# Patient Record
Sex: Female | Born: 1940
Health system: Southern US, Community
[De-identification: ages and names within clinical notes are randomized; demographics above are authoritative.]

## PROBLEM LIST (undated history)

## (undated) DIAGNOSIS — K219 Gastro-esophageal reflux disease without esophagitis: Secondary | ICD-10-CM

## (undated) DIAGNOSIS — D649 Anemia, unspecified: Secondary | ICD-10-CM

## (undated) DIAGNOSIS — K529 Noninfective gastroenteritis and colitis, unspecified: Secondary | ICD-10-CM

## (undated) DIAGNOSIS — K635 Polyp of colon: Secondary | ICD-10-CM

## (undated) DIAGNOSIS — R112 Nausea with vomiting, unspecified: Secondary | ICD-10-CM

## (undated) DIAGNOSIS — M1990 Unspecified osteoarthritis, unspecified site: Secondary | ICD-10-CM

## (undated) DIAGNOSIS — H269 Unspecified cataract: Secondary | ICD-10-CM

## (undated) DIAGNOSIS — H547 Unspecified visual loss: Secondary | ICD-10-CM

## (undated) DIAGNOSIS — G5600 Carpal tunnel syndrome, unspecified upper limb: Secondary | ICD-10-CM

## (undated) DIAGNOSIS — E119 Type 2 diabetes mellitus without complications: Secondary | ICD-10-CM

## (undated) DIAGNOSIS — N289 Disorder of kidney and ureter, unspecified: Secondary | ICD-10-CM

## (undated) DIAGNOSIS — Z955 Presence of coronary angioplasty implant and graft: Secondary | ICD-10-CM

## (undated) DIAGNOSIS — J189 Pneumonia, unspecified organism: Secondary | ICD-10-CM

## (undated) DIAGNOSIS — I499 Cardiac arrhythmia, unspecified: Secondary | ICD-10-CM

## (undated) DIAGNOSIS — I4891 Unspecified atrial fibrillation: Secondary | ICD-10-CM

## (undated) DIAGNOSIS — M810 Age-related osteoporosis without current pathological fracture: Secondary | ICD-10-CM

## (undated) DIAGNOSIS — K589 Irritable bowel syndrome without diarrhea: Secondary | ICD-10-CM

## (undated) DIAGNOSIS — F32A Depression, unspecified: Secondary | ICD-10-CM

## (undated) DIAGNOSIS — I10 Essential (primary) hypertension: Secondary | ICD-10-CM

## (undated) DIAGNOSIS — I2510 Atherosclerotic heart disease of native coronary artery without angina pectoris: Secondary | ICD-10-CM

## (undated) DIAGNOSIS — Z9289 Personal history of other medical treatment: Secondary | ICD-10-CM

## (undated) DIAGNOSIS — C189 Malignant neoplasm of colon, unspecified: Secondary | ICD-10-CM

## (undated) DIAGNOSIS — E213 Hyperparathyroidism, unspecified: Secondary | ICD-10-CM

## (undated) DIAGNOSIS — E785 Hyperlipidemia, unspecified: Secondary | ICD-10-CM

## (undated) DIAGNOSIS — R296 Repeated falls: Secondary | ICD-10-CM

## (undated) DIAGNOSIS — F329 Major depressive disorder, single episode, unspecified: Secondary | ICD-10-CM

## (undated) DIAGNOSIS — Z9889 Other specified postprocedural states: Secondary | ICD-10-CM

## (undated) DIAGNOSIS — F419 Anxiety disorder, unspecified: Secondary | ICD-10-CM

## (undated) DIAGNOSIS — IMO0002 Reserved for concepts with insufficient information to code with codable children: Secondary | ICD-10-CM

## (undated) DIAGNOSIS — F0390 Unspecified dementia without behavioral disturbance: Secondary | ICD-10-CM

## (undated) HISTORY — DX: Carpal tunnel syndrome, unspecified upper limb: G56.00

## (undated) HISTORY — PX: COLON SURGERY: SHX602

## (undated) HISTORY — DX: Reserved for concepts with insufficient information to code with codable children: IMO0002

## (undated) HISTORY — DX: Depression, unspecified: F32.A

## (undated) HISTORY — PX: UPPER GI ENDOSCOPY: SHX6162

## (undated) HISTORY — DX: Malignant neoplasm of colon, unspecified: C18.9

## (undated) HISTORY — DX: Gastro-esophageal reflux disease without esophagitis: K21.9

## (undated) HISTORY — DX: Major depressive disorder, single episode, unspecified: F32.9

## (undated) HISTORY — PX: DILATION AND CURETTAGE OF UTERUS: SHX78

## (undated) HISTORY — PX: TONSILLECTOMY: SUR1361

## (undated) HISTORY — DX: Hyperlipidemia, unspecified: E78.5

## (undated) HISTORY — DX: Anxiety disorder, unspecified: F41.9

## (undated) HISTORY — DX: Polyp of colon: K63.5

## (undated) HISTORY — DX: Noninfective gastroenteritis and colitis, unspecified: K52.9

## (undated) HISTORY — DX: Age-related osteoporosis without current pathological fracture: M81.0

## (undated) HISTORY — PX: HEMATOMA EVACUATION: SHX5118

## (undated) HISTORY — DX: Essential (primary) hypertension: I10

## (undated) HISTORY — PX: CARPAL TUNNEL RELEASE: SHX101

## (undated) HISTORY — DX: Type 2 diabetes mellitus without complications: E11.9

## (undated) HISTORY — DX: Unspecified osteoarthritis, unspecified site: M19.90

## (undated) HISTORY — PX: KNEE ARTHROSCOPY: SHX127

## (undated) HISTORY — PX: COLONOSCOPY: SHX174

## (undated) HISTORY — DX: Anemia, unspecified: D64.9

## (undated) HISTORY — DX: Unspecified cataract: H26.9

## (undated) HISTORY — DX: Irritable bowel syndrome, unspecified: K58.9

## (undated) HISTORY — PX: CHOLECYSTECTOMY: SHX55

---

## 1998-10-31 ENCOUNTER — Encounter: Admission: RE | Admit: 1998-10-31 | Discharge: 1999-01-29 | Payer: Self-pay | Admitting: Internal Medicine

## 1999-07-25 ENCOUNTER — Encounter: Payer: Self-pay | Admitting: Internal Medicine

## 1999-07-25 ENCOUNTER — Encounter: Admission: RE | Admit: 1999-07-25 | Discharge: 1999-07-25 | Payer: Self-pay | Admitting: Internal Medicine

## 1999-08-07 ENCOUNTER — Other Ambulatory Visit: Admission: RE | Admit: 1999-08-07 | Discharge: 1999-08-07 | Payer: Self-pay | Admitting: Internal Medicine

## 1999-12-22 ENCOUNTER — Encounter: Payer: Self-pay | Admitting: Orthopedic Surgery

## 1999-12-22 ENCOUNTER — Encounter: Admission: RE | Admit: 1999-12-22 | Discharge: 1999-12-22 | Payer: Self-pay | Admitting: Orthopedic Surgery

## 1999-12-23 ENCOUNTER — Ambulatory Visit (HOSPITAL_BASED_OUTPATIENT_CLINIC_OR_DEPARTMENT_OTHER): Admission: RE | Admit: 1999-12-23 | Discharge: 1999-12-23 | Payer: Self-pay | Admitting: Orthopedic Surgery

## 2000-08-02 ENCOUNTER — Encounter: Admission: RE | Admit: 2000-08-02 | Discharge: 2000-08-02 | Payer: Self-pay | Admitting: Internal Medicine

## 2000-08-02 ENCOUNTER — Encounter: Payer: Self-pay | Admitting: Internal Medicine

## 2000-08-09 ENCOUNTER — Encounter: Admission: RE | Admit: 2000-08-09 | Discharge: 2000-08-09 | Payer: Self-pay | Admitting: Internal Medicine

## 2000-08-09 ENCOUNTER — Encounter: Payer: Self-pay | Admitting: Internal Medicine

## 2001-08-04 ENCOUNTER — Encounter: Payer: Self-pay | Admitting: Family Medicine

## 2001-08-04 ENCOUNTER — Encounter: Admission: RE | Admit: 2001-08-04 | Discharge: 2001-08-04 | Payer: Self-pay | Admitting: Family Medicine

## 2002-03-26 ENCOUNTER — Encounter: Admission: RE | Admit: 2002-03-26 | Discharge: 2002-03-26 | Payer: Self-pay | Admitting: Orthopedic Surgery

## 2002-03-26 ENCOUNTER — Encounter: Payer: Self-pay | Admitting: Orthopedic Surgery

## 2002-04-10 ENCOUNTER — Ambulatory Visit (HOSPITAL_BASED_OUTPATIENT_CLINIC_OR_DEPARTMENT_OTHER): Admission: RE | Admit: 2002-04-10 | Discharge: 2002-04-10 | Payer: Self-pay | Admitting: Orthopedic Surgery

## 2002-08-07 ENCOUNTER — Encounter: Admission: RE | Admit: 2002-08-07 | Discharge: 2002-08-07 | Payer: Self-pay | Admitting: Family Medicine

## 2002-08-07 ENCOUNTER — Encounter: Payer: Self-pay | Admitting: Family Medicine

## 2002-08-14 ENCOUNTER — Other Ambulatory Visit: Admission: RE | Admit: 2002-08-14 | Discharge: 2002-08-14 | Payer: Self-pay | Admitting: Family Medicine

## 2003-08-09 ENCOUNTER — Encounter: Admission: RE | Admit: 2003-08-09 | Discharge: 2003-08-09 | Payer: Self-pay | Admitting: Family Medicine

## 2003-12-30 ENCOUNTER — Emergency Department (HOSPITAL_COMMUNITY): Admission: EM | Admit: 2003-12-30 | Discharge: 2003-12-30 | Payer: Self-pay

## 2004-08-14 ENCOUNTER — Ambulatory Visit (HOSPITAL_COMMUNITY): Admission: RE | Admit: 2004-08-14 | Discharge: 2004-08-14 | Payer: Self-pay | Admitting: Family Medicine

## 2004-09-03 ENCOUNTER — Ambulatory Visit: Payer: Self-pay | Admitting: Family Medicine

## 2004-09-10 ENCOUNTER — Ambulatory Visit: Payer: Self-pay | Admitting: Family Medicine

## 2004-09-10 ENCOUNTER — Other Ambulatory Visit: Admission: RE | Admit: 2004-09-10 | Discharge: 2004-09-10 | Payer: Self-pay | Admitting: Family Medicine

## 2004-09-27 ENCOUNTER — Ambulatory Visit: Payer: Self-pay | Admitting: Family Medicine

## 2004-10-07 ENCOUNTER — Ambulatory Visit: Payer: Self-pay | Admitting: Internal Medicine

## 2004-10-14 ENCOUNTER — Ambulatory Visit: Payer: Self-pay | Admitting: Family Medicine

## 2004-10-16 ENCOUNTER — Ambulatory Visit: Payer: Self-pay | Admitting: Internal Medicine

## 2004-10-20 ENCOUNTER — Ambulatory Visit: Payer: Self-pay | Admitting: Internal Medicine

## 2004-11-07 ENCOUNTER — Ambulatory Visit: Payer: Self-pay | Admitting: Family Medicine

## 2004-11-14 ENCOUNTER — Ambulatory Visit: Payer: Self-pay | Admitting: Family Medicine

## 2004-11-17 ENCOUNTER — Ambulatory Visit: Payer: Self-pay | Admitting: Family Medicine

## 2005-08-25 ENCOUNTER — Encounter: Admission: RE | Admit: 2005-08-25 | Discharge: 2005-08-25 | Payer: Self-pay | Admitting: Family Medicine

## 2005-09-11 ENCOUNTER — Ambulatory Visit: Payer: Self-pay | Admitting: Family Medicine

## 2005-09-18 ENCOUNTER — Ambulatory Visit: Payer: Self-pay | Admitting: Family Medicine

## 2005-09-28 DIAGNOSIS — Z955 Presence of coronary angioplasty implant and graft: Secondary | ICD-10-CM

## 2005-09-28 HISTORY — DX: Presence of coronary angioplasty implant and graft: Z95.5

## 2005-11-05 ENCOUNTER — Ambulatory Visit: Payer: Self-pay | Admitting: Family Medicine

## 2005-12-30 ENCOUNTER — Ambulatory Visit: Payer: Self-pay | Admitting: Family Medicine

## 2006-01-07 ENCOUNTER — Encounter: Payer: Self-pay | Admitting: Cardiology

## 2006-01-07 ENCOUNTER — Ambulatory Visit: Payer: Self-pay

## 2006-01-15 ENCOUNTER — Ambulatory Visit: Payer: Self-pay | Admitting: Internal Medicine

## 2006-01-20 ENCOUNTER — Inpatient Hospital Stay (HOSPITAL_BASED_OUTPATIENT_CLINIC_OR_DEPARTMENT_OTHER): Admission: RE | Admit: 2006-01-20 | Discharge: 2006-01-20 | Payer: Self-pay | Admitting: Internal Medicine

## 2006-01-20 ENCOUNTER — Ambulatory Visit: Payer: Self-pay | Admitting: Internal Medicine

## 2006-01-22 ENCOUNTER — Ambulatory Visit: Payer: Self-pay | Admitting: Cardiology

## 2006-01-22 ENCOUNTER — Ambulatory Visit (HOSPITAL_COMMUNITY): Admission: RE | Admit: 2006-01-22 | Discharge: 2006-01-23 | Payer: Self-pay | Admitting: Cardiology

## 2006-01-28 ENCOUNTER — Ambulatory Visit: Payer: Self-pay | Admitting: Cardiology

## 2006-03-11 ENCOUNTER — Ambulatory Visit: Payer: Self-pay | Admitting: Internal Medicine

## 2006-03-18 ENCOUNTER — Ambulatory Visit: Payer: Self-pay | Admitting: Internal Medicine

## 2006-05-03 ENCOUNTER — Ambulatory Visit: Payer: Self-pay | Admitting: Family Medicine

## 2006-05-03 ENCOUNTER — Ambulatory Visit: Payer: Self-pay | Admitting: Internal Medicine

## 2006-05-28 ENCOUNTER — Encounter: Admission: RE | Admit: 2006-05-28 | Discharge: 2006-05-28 | Payer: Self-pay | Admitting: Internal Medicine

## 2006-05-28 ENCOUNTER — Ambulatory Visit: Payer: Self-pay | Admitting: Internal Medicine

## 2006-06-09 ENCOUNTER — Ambulatory Visit: Payer: Self-pay | Admitting: *Deleted

## 2006-06-18 ENCOUNTER — Ambulatory Visit: Payer: Self-pay | Admitting: *Deleted

## 2006-06-25 ENCOUNTER — Ambulatory Visit: Payer: Self-pay | Admitting: *Deleted

## 2006-06-28 ENCOUNTER — Ambulatory Visit: Payer: Self-pay | Admitting: Internal Medicine

## 2006-06-30 ENCOUNTER — Ambulatory Visit: Payer: Self-pay | Admitting: Internal Medicine

## 2006-07-28 ENCOUNTER — Ambulatory Visit: Payer: Self-pay

## 2006-07-28 ENCOUNTER — Ambulatory Visit: Payer: Self-pay | Admitting: Family Medicine

## 2006-08-09 ENCOUNTER — Ambulatory Visit: Payer: Self-pay | Admitting: Family Medicine

## 2006-08-24 ENCOUNTER — Ambulatory Visit: Payer: Self-pay | Admitting: Family Medicine

## 2006-08-27 ENCOUNTER — Ambulatory Visit: Payer: Self-pay | Admitting: Family Medicine

## 2006-08-28 LAB — CONVERTED CEMR LAB: Pap Smear: NORMAL

## 2006-09-06 ENCOUNTER — Ambulatory Visit: Payer: Self-pay | Admitting: Internal Medicine

## 2006-09-07 ENCOUNTER — Ambulatory Visit: Payer: Self-pay | Admitting: Family Medicine

## 2006-09-07 LAB — CONVERTED CEMR LAB
ALT: 34 units/L (ref 0–40)
AST: 29 units/L (ref 0–37)
Albumin: 4.1 g/dL (ref 3.5–5.2)
Alkaline Phosphatase: 81 units/L (ref 39–117)
BUN: 17 mg/dL (ref 6–23)
Basophils Absolute: 0 10*3/uL (ref 0.0–0.1)
Basophils Relative: 0.2 % (ref 0.0–1.0)
CO2: 30 meq/L (ref 19–32)
Calcium: 9.5 mg/dL (ref 8.4–10.5)
Chloride: 103 meq/L (ref 96–112)
Chol/HDL Ratio, serum: 4
Cholesterol: 132 mg/dL (ref 0–200)
Creatinine, Ser: 0.9 mg/dL (ref 0.4–1.2)
Creatinine,U: 181.8 mg/dL
Eosinophil percent: 0.8 % (ref 0.0–5.0)
GFR calc non Af Amer: 67 mL/min
Glomerular Filtration Rate, Af Am: 81 mL/min/{1.73_m2}
Glucose, Bld: 100 mg/dL — ABNORMAL HIGH (ref 70–99)
HCT: 38.8 % (ref 36.0–46.0)
HDL: 32.7 mg/dL — ABNORMAL LOW (ref >39.0)
Hemoglobin: 13.4 g/dL (ref 12.0–15.0)
Hgb A1c MFr Bld: 7.3 % — ABNORMAL HIGH (ref 4.6–6.0)
LDL DIRECT: 60 mg/dL
Lymphocytes Relative: 18.2 % (ref 12.0–46.0)
MCHC: 34.6 g/dL (ref 30.0–36.0)
MCV: 93.3 fL (ref 78.0–100.0)
Microalb Creat Ratio: 9.4 mg/g (ref 0.0–30.0)
Microalb, Ur: 1.7 mg/dL (ref 0.0–1.9)
Monocytes Absolute: 0.9 10*3/uL — ABNORMAL HIGH (ref 0.2–0.7)
Monocytes Relative: 6.4 % (ref 3.0–11.0)
Neutro Abs: 10.1 10*3/uL — ABNORMAL HIGH (ref 1.4–7.7)
Neutrophils Relative %: 74.4 % (ref 43.0–77.0)
Platelets: 267 10*3/uL (ref 150–400)
Potassium: 4.6 meq/L (ref 3.5–5.1)
RBC: 4.16 M/uL (ref 3.87–5.11)
RDW: 11.9 % (ref 11.5–14.6)
Sodium: 141 meq/L (ref 135–145)
TSH: 1.72 microintl units/mL (ref 0.35–5.50)
Total Bilirubin: 0.8 mg/dL (ref 0.3–1.2)
Total Protein: 6.5 g/dL (ref 6.0–8.3)
Triglyceride fasting, serum: 240 mg/dL (ref 0–149)
VLDL: 48 mg/dL — ABNORMAL HIGH (ref 0–40)
WBC: 13.6 10*3/uL — ABNORMAL HIGH (ref 4.5–10.5)

## 2006-09-13 ENCOUNTER — Ambulatory Visit: Payer: Self-pay | Admitting: Family Medicine

## 2006-09-13 ENCOUNTER — Encounter: Payer: Self-pay | Admitting: Family Medicine

## 2006-09-13 ENCOUNTER — Other Ambulatory Visit: Admission: RE | Admit: 2006-09-13 | Discharge: 2006-09-13 | Payer: Self-pay | Admitting: Family Medicine

## 2006-12-09 ENCOUNTER — Ambulatory Visit: Payer: Self-pay | Admitting: Internal Medicine

## 2006-12-09 LAB — CONVERTED CEMR LAB
ALT: 27 units/L (ref 0–40)
AST: 30 units/L (ref 0–37)
Albumin: 3.5 g/dL (ref 3.5–5.2)
Alkaline Phosphatase: 67 units/L (ref 39–117)
BUN: 12 mg/dL (ref 6–23)
Bilirubin, Direct: 0.2 mg/dL (ref 0.0–0.3)
CO2: 30 meq/L (ref 19–32)
Calcium: 9.2 mg/dL (ref 8.4–10.5)
Chloride: 104 meq/L (ref 96–112)
Cholesterol: 136 mg/dL (ref 0–200)
Creatinine, Ser: 0.8 mg/dL (ref 0.4–1.2)
Direct LDL: 71.6 mg/dL
GFR calc Af Amer: 92 mL/min
GFR calc non Af Amer: 76 mL/min
Glucose, Bld: 163 mg/dL — ABNORMAL HIGH (ref 70–99)
HDL: 38.5 mg/dL — ABNORMAL LOW (ref >39.0)
Potassium: 3.9 meq/L (ref 3.5–5.1)
Sodium: 144 meq/L (ref 135–145)
Total Bilirubin: 0.9 mg/dL (ref 0.3–1.2)
Total CHOL/HDL Ratio: 3.5
Total Protein: 6.5 g/dL (ref 6.0–8.3)
Triglycerides: 225 mg/dL (ref 0–149)
VLDL: 45 mg/dL — ABNORMAL HIGH (ref 0–40)

## 2007-02-15 ENCOUNTER — Ambulatory Visit: Payer: Self-pay | Admitting: Family Medicine

## 2007-03-21 DIAGNOSIS — E1121 Type 2 diabetes mellitus with diabetic nephropathy: Secondary | ICD-10-CM | POA: Insufficient documentation

## 2007-03-21 DIAGNOSIS — K589 Irritable bowel syndrome without diarrhea: Secondary | ICD-10-CM

## 2007-03-21 DIAGNOSIS — I10 Essential (primary) hypertension: Secondary | ICD-10-CM | POA: Insufficient documentation

## 2007-03-21 DIAGNOSIS — E1169 Type 2 diabetes mellitus with other specified complication: Secondary | ICD-10-CM | POA: Insufficient documentation

## 2007-03-21 DIAGNOSIS — G5600 Carpal tunnel syndrome, unspecified upper limb: Secondary | ICD-10-CM

## 2007-03-21 DIAGNOSIS — M1990 Unspecified osteoarthritis, unspecified site: Secondary | ICD-10-CM | POA: Insufficient documentation

## 2007-04-28 ENCOUNTER — Telehealth: Payer: Self-pay | Admitting: Family Medicine

## 2007-05-02 ENCOUNTER — Ambulatory Visit: Payer: Self-pay | Admitting: Internal Medicine

## 2007-05-03 ENCOUNTER — Ambulatory Visit: Payer: Self-pay | Admitting: Internal Medicine

## 2007-05-03 LAB — CONVERTED CEMR LAB
ALT: 24 units/L (ref 0–35)
AST: 25 units/L (ref 0–37)
Albumin: 4 g/dL (ref 3.5–5.2)
Alkaline Phosphatase: 49 units/L (ref 39–117)
BUN: 16 mg/dL (ref 6–23)
Bilirubin, Direct: 0.1 mg/dL (ref 0.0–0.3)
CO2: 31 meq/L (ref 19–32)
Calcium: 9.4 mg/dL (ref 8.4–10.5)
Chloride: 105 meq/L (ref 96–112)
Cholesterol: 133 mg/dL (ref 0–200)
Creatinine, Ser: 0.8 mg/dL (ref 0.4–1.2)
Direct LDL: 66.4 mg/dL
GFR calc Af Amer: 92 mL/min
GFR calc non Af Amer: 76 mL/min
Glucose, Bld: 157 mg/dL — ABNORMAL HIGH (ref 70–99)
HDL: 30.9 mg/dL — ABNORMAL LOW (ref >39.0)
Potassium: 4 meq/L (ref 3.5–5.1)
Sodium: 142 meq/L (ref 135–145)
Total Bilirubin: 1.2 mg/dL (ref 0.3–1.2)
Total CHOL/HDL Ratio: 4.3
Total Protein: 6.5 g/dL (ref 6.0–8.3)
Triglycerides: 250 mg/dL (ref 0–149)
VLDL: 50 mg/dL — ABNORMAL HIGH (ref 0–40)

## 2007-05-10 ENCOUNTER — Ambulatory Visit: Payer: Self-pay | Admitting: Family Medicine

## 2007-05-12 ENCOUNTER — Telehealth: Payer: Self-pay | Admitting: Family Medicine

## 2007-05-12 LAB — CONVERTED CEMR LAB: Hgb A1c MFr Bld: 6.3 % — ABNORMAL HIGH (ref 4.6–6.0)

## 2007-07-01 ENCOUNTER — Ambulatory Visit: Payer: Self-pay | Admitting: Family Medicine

## 2007-09-01 ENCOUNTER — Encounter: Admission: RE | Admit: 2007-09-01 | Discharge: 2007-09-01 | Payer: Self-pay | Admitting: Family Medicine

## 2007-09-01 ENCOUNTER — Encounter: Payer: Self-pay | Admitting: Family Medicine

## 2007-09-19 ENCOUNTER — Ambulatory Visit: Payer: Self-pay | Admitting: Family Medicine

## 2007-09-19 DIAGNOSIS — K219 Gastro-esophageal reflux disease without esophagitis: Secondary | ICD-10-CM

## 2007-09-19 DIAGNOSIS — F411 Generalized anxiety disorder: Secondary | ICD-10-CM | POA: Insufficient documentation

## 2007-09-19 DIAGNOSIS — F331 Major depressive disorder, recurrent, moderate: Secondary | ICD-10-CM | POA: Insufficient documentation

## 2007-09-19 DIAGNOSIS — E785 Hyperlipidemia, unspecified: Secondary | ICD-10-CM

## 2007-09-20 ENCOUNTER — Encounter: Payer: Self-pay | Admitting: Family Medicine

## 2007-09-30 ENCOUNTER — Telehealth: Payer: Self-pay | Admitting: Family Medicine

## 2007-10-05 LAB — CONVERTED CEMR LAB
ALT: 23 units/L (ref 0–35)
AST: 22 units/L (ref 0–37)
Albumin: 4 g/dL (ref 3.5–5.2)
Alkaline Phosphatase: 80 units/L (ref 39–117)
BUN: 19 mg/dL (ref 6–23)
Basophils Absolute: 0 10*3/uL (ref 0.0–0.1)
Basophils Relative: 0.2 % (ref 0.0–1.0)
Bilirubin, Direct: 0.1 mg/dL (ref 0.0–0.3)
CO2: 30 meq/L (ref 19–32)
Calcium: 9.6 mg/dL (ref 8.4–10.5)
Chloride: 100 meq/L (ref 96–112)
Cholesterol: 157 mg/dL (ref 0–200)
Creatinine, Ser: 0.9 mg/dL (ref 0.4–1.2)
Direct LDL: 86.2 mg/dL
Eosinophils Absolute: 0.3 10*3/uL (ref 0.0–0.6)
Eosinophils Relative: 1.5 % (ref 0.0–5.0)
GFR calc Af Amer: 80 mL/min
GFR calc non Af Amer: 66 mL/min
Glucose, Bld: 195 mg/dL — ABNORMAL HIGH (ref 70–99)
HCT: 36.7 % (ref 36.0–46.0)
HDL: 34.9 mg/dL — ABNORMAL LOW (ref >39.0)
Hemoglobin: 13 g/dL (ref 12.0–15.0)
Hgb A1c MFr Bld: 7.8 % — ABNORMAL HIGH (ref 4.6–6.0)
Lymphocytes Relative: 18.6 % (ref 12.0–46.0)
MCHC: 35.4 g/dL (ref 30.0–36.0)
MCV: 89.8 fL (ref 78.0–100.0)
Monocytes Absolute: 0.7 10*3/uL (ref 0.2–0.7)
Monocytes Relative: 4.1 % (ref 3.0–11.0)
Neutro Abs: 12.8 10*3/uL — ABNORMAL HIGH (ref 1.4–7.7)
Neutrophils Relative %: 75.6 % (ref 43.0–77.0)
Platelets: 267 10*3/uL (ref 150–400)
Potassium: 4.4 meq/L (ref 3.5–5.1)
RBC: 4.09 M/uL (ref 3.87–5.11)
RDW: 12 % (ref 11.5–14.6)
Sodium: 141 meq/L (ref 135–145)
TSH: 1.12 microintl units/mL (ref 0.35–5.50)
Total Bilirubin: 1 mg/dL (ref 0.3–1.2)
Total CHOL/HDL Ratio: 4.5
Total Protein: 7 g/dL (ref 6.0–8.3)
WBC: 16.9 10*3/uL — ABNORMAL HIGH (ref 4.5–10.5)

## 2007-10-10 ENCOUNTER — Telehealth: Payer: Self-pay | Admitting: Family Medicine

## 2007-10-10 ENCOUNTER — Ambulatory Visit: Payer: Self-pay | Admitting: Family Medicine

## 2007-10-10 LAB — CONVERTED CEMR LAB
Bilirubin Urine: NEGATIVE
Blood in Urine, dipstick: NEGATIVE
Glucose, Urine, Semiquant: NEGATIVE
Ketones, urine, test strip: NEGATIVE
Nitrite: NEGATIVE
Protein, U semiquant: NEGATIVE
Specific Gravity, Urine: 1.025
Urobilinogen, UA: 0.2
WBC Urine, dipstick: NEGATIVE
pH: 5

## 2007-10-19 ENCOUNTER — Telehealth: Payer: Self-pay | Admitting: Family Medicine

## 2007-10-24 ENCOUNTER — Telehealth: Payer: Self-pay | Admitting: Family Medicine

## 2007-10-24 ENCOUNTER — Ambulatory Visit: Payer: Self-pay | Admitting: Family Medicine

## 2007-11-16 ENCOUNTER — Ambulatory Visit: Payer: Self-pay | Admitting: Internal Medicine

## 2007-11-17 ENCOUNTER — Ambulatory Visit: Payer: Self-pay | Admitting: Internal Medicine

## 2007-11-17 LAB — CONVERTED CEMR LAB
ALT: 20 units/L (ref 0–35)
AST: 22 units/L (ref 0–37)
Albumin: 3.8 g/dL (ref 3.5–5.2)
Alkaline Phosphatase: 49 units/L (ref 39–117)
BUN: 10 mg/dL (ref 6–23)
Bilirubin, Direct: 0.1 mg/dL (ref 0.0–0.3)
CO2: 28 meq/L (ref 19–32)
Calcium: 8.9 mg/dL (ref 8.4–10.5)
Chloride: 103 meq/L (ref 96–112)
Cholesterol: 101 mg/dL (ref 0–200)
Creatinine, Ser: 0.9 mg/dL (ref 0.4–1.2)
GFR calc Af Amer: 80 mL/min
GFR calc non Af Amer: 66 mL/min
Glucose, Bld: 155 mg/dL — ABNORMAL HIGH (ref 70–99)
HDL: 31 mg/dL — ABNORMAL LOW (ref >39.0)
LDL Cholesterol: 45 mg/dL (ref 0–99)
Potassium: 3.5 meq/L (ref 3.5–5.1)
Sodium: 142 meq/L (ref 135–145)
Total Bilirubin: 0.7 mg/dL (ref 0.3–1.2)
Total CHOL/HDL Ratio: 3.3
Total Protein: 6.4 g/dL (ref 6.0–8.3)
Triglycerides: 127 mg/dL (ref 0–149)
VLDL: 25 mg/dL (ref 0–40)

## 2007-11-21 ENCOUNTER — Telehealth (INDEPENDENT_AMBULATORY_CARE_PROVIDER_SITE_OTHER): Payer: Self-pay | Admitting: *Deleted

## 2007-11-23 ENCOUNTER — Ambulatory Visit: Payer: Self-pay | Admitting: Family Medicine

## 2007-11-23 DIAGNOSIS — I2510 Atherosclerotic heart disease of native coronary artery without angina pectoris: Secondary | ICD-10-CM

## 2007-12-19 ENCOUNTER — Ambulatory Visit: Payer: Self-pay | Admitting: Family Medicine

## 2007-12-20 ENCOUNTER — Encounter: Payer: Self-pay | Admitting: Family Medicine

## 2007-12-20 LAB — CONVERTED CEMR LAB
Creatinine,U: 152.1 mg/dL
Microalb Creat Ratio: 19.7 mg/g (ref 0.0–30.0)

## 2007-12-21 ENCOUNTER — Ambulatory Visit: Payer: Self-pay | Admitting: Family Medicine

## 2008-03-20 ENCOUNTER — Ambulatory Visit: Payer: Self-pay | Admitting: Family Medicine

## 2008-03-27 ENCOUNTER — Ambulatory Visit: Payer: Self-pay | Admitting: Family Medicine

## 2008-03-29 ENCOUNTER — Encounter: Payer: Self-pay | Admitting: Family Medicine

## 2008-04-03 LAB — CONVERTED CEMR LAB
ALT: 24 units/L (ref 0–35)
Basophils Absolute: 0 10*3/uL (ref 0.0–0.1)
Basophils Relative: 0.4 % (ref 0.0–1.0)
Calcium: 9.1 mg/dL (ref 8.4–10.5)
Creatinine, Ser: 0.9 mg/dL (ref 0.4–1.2)
Eosinophils Absolute: 0.1 10*3/uL (ref 0.0–0.7)
GFR calc Af Amer: 80 mL/min
GFR calc non Af Amer: 66 mL/min
Hemoglobin: 11.4 g/dL — ABNORMAL LOW (ref 12.0–15.0)
MCHC: 34.4 g/dL (ref 30.0–36.0)
MCV: 88.8 fL (ref 78.0–100.0)
Neutro Abs: 6.3 10*3/uL (ref 1.4–7.7)
RBC: 3.73 M/uL — ABNORMAL LOW (ref 3.87–5.11)
RDW: 12.2 % (ref 11.5–14.6)
Sodium: 143 meq/L (ref 135–145)
TSH: 1.45 microintl units/mL (ref 0.35–5.50)
Total Bilirubin: 1 mg/dL (ref 0.3–1.2)

## 2008-04-27 ENCOUNTER — Ambulatory Visit: Payer: Self-pay | Admitting: Family Medicine

## 2008-04-27 LAB — CONVERTED CEMR LAB
Bilirubin Urine: NEGATIVE
Blood in Urine, dipstick: NEGATIVE
Glucose, Urine, Semiquant: NEGATIVE
Ketones, urine, test strip: NEGATIVE
pH: 6

## 2008-05-01 ENCOUNTER — Telehealth: Payer: Self-pay | Admitting: Family Medicine

## 2008-05-17 ENCOUNTER — Encounter: Payer: Self-pay | Admitting: Family Medicine

## 2008-06-18 ENCOUNTER — Ambulatory Visit: Payer: Self-pay | Admitting: Family Medicine

## 2008-06-27 ENCOUNTER — Encounter: Payer: Self-pay | Admitting: Family Medicine

## 2008-07-26 ENCOUNTER — Ambulatory Visit: Payer: Self-pay | Admitting: Family Medicine

## 2008-07-26 ENCOUNTER — Encounter: Payer: Self-pay | Admitting: Family Medicine

## 2008-08-27 ENCOUNTER — Ambulatory Visit: Payer: Self-pay | Admitting: Internal Medicine

## 2008-09-05 ENCOUNTER — Ambulatory Visit (HOSPITAL_COMMUNITY): Admission: RE | Admit: 2008-09-05 | Discharge: 2008-09-05 | Payer: Self-pay | Admitting: Family Medicine

## 2008-09-07 ENCOUNTER — Encounter (INDEPENDENT_AMBULATORY_CARE_PROVIDER_SITE_OTHER): Payer: Self-pay | Admitting: *Deleted

## 2008-09-14 ENCOUNTER — Ambulatory Visit: Payer: Self-pay | Admitting: Family Medicine

## 2008-09-14 LAB — CONVERTED CEMR LAB
ALT: 12 units/L (ref 0–35)
Alkaline Phosphatase: 45 units/L (ref 39–117)
Bilirubin, Direct: 0.1 mg/dL (ref 0.0–0.3)
CO2: 26 meq/L (ref 19–32)
Calcium: 9 mg/dL (ref 8.4–10.5)
Cholesterol: 103 mg/dL (ref 0–200)
Glucose, Bld: 95 mg/dL (ref 70–99)
HDL: 32.7 mg/dL — ABNORMAL LOW (ref >39.0)
Sodium: 143 meq/L (ref 135–145)
Total Protein: 6.3 g/dL (ref 6.0–8.3)

## 2008-09-24 ENCOUNTER — Ambulatory Visit: Payer: Self-pay | Admitting: Family Medicine

## 2008-09-25 ENCOUNTER — Telehealth: Payer: Self-pay | Admitting: Family Medicine

## 2008-09-26 LAB — CONVERTED CEMR LAB
AST: 23 units/L (ref 0–37)
Alkaline Phosphatase: 52 units/L (ref 39–117)
Basophils Absolute: 0.1 10*3/uL (ref 0.0–0.1)
Chloride: 109 meq/L (ref 96–112)
Eosinophils Absolute: 0.3 10*3/uL (ref 0.0–0.7)
GFR calc non Af Amer: 59 mL/min
MCHC: 31.3 g/dL (ref 30.0–36.0)
MCV: 73.7 fL — ABNORMAL LOW (ref 78.0–100.0)
Neutrophils Relative %: 67.9 % (ref 43.0–77.0)
Platelets: 211 10*3/uL (ref 150–400)
Potassium: 4.3 meq/L (ref 3.5–5.1)
Sodium: 143 meq/L (ref 135–145)
TSH: 1.41 microintl units/mL (ref 0.35–5.50)
Total Bilirubin: 0.9 mg/dL (ref 0.3–1.2)
WBC: 10.3 10*3/uL (ref 4.5–10.5)

## 2008-09-27 ENCOUNTER — Ambulatory Visit: Payer: Self-pay | Admitting: Family Medicine

## 2008-10-01 LAB — CONVERTED CEMR LAB
Ferritin: 10 ng/mL (ref 10–291)
Hemoglobin: 9.7 g/dL — ABNORMAL LOW (ref 12.0–15.0)
RBC: 4.44 M/uL (ref 3.87–5.11)
RDW: 16 % — ABNORMAL HIGH (ref 11.5–15.5)
Saturation Ratios: 4 % — ABNORMAL LOW (ref 20–55)
TIBC: 465 ug/dL (ref 250–470)

## 2008-10-05 ENCOUNTER — Ambulatory Visit: Payer: Self-pay | Admitting: Family Medicine

## 2008-10-05 DIAGNOSIS — C180 Malignant neoplasm of cecum: Secondary | ICD-10-CM | POA: Insufficient documentation

## 2008-10-05 LAB — CONVERTED CEMR LAB
OCCULT 1: POSITIVE
OCCULT 2: POSITIVE

## 2008-10-24 ENCOUNTER — Encounter: Admission: RE | Admit: 2008-10-24 | Discharge: 2008-10-24 | Payer: Self-pay | Admitting: Orthopedic Surgery

## 2008-10-31 ENCOUNTER — Ambulatory Visit: Payer: Self-pay | Admitting: Internal Medicine

## 2008-10-31 ENCOUNTER — Inpatient Hospital Stay (HOSPITAL_COMMUNITY): Admission: EM | Admit: 2008-10-31 | Discharge: 2008-11-04 | Payer: Self-pay | Admitting: Emergency Medicine

## 2008-11-01 ENCOUNTER — Encounter: Payer: Self-pay | Admitting: Internal Medicine

## 2008-11-01 ENCOUNTER — Ambulatory Visit: Payer: Self-pay | Admitting: Gastroenterology

## 2008-11-02 ENCOUNTER — Encounter: Payer: Self-pay | Admitting: Gastroenterology

## 2008-11-03 ENCOUNTER — Encounter: Payer: Self-pay | Admitting: Family Medicine

## 2008-11-04 ENCOUNTER — Encounter: Payer: Self-pay | Admitting: Gastroenterology

## 2008-11-05 ENCOUNTER — Encounter: Payer: Self-pay | Admitting: Internal Medicine

## 2008-11-05 ENCOUNTER — Telehealth: Payer: Self-pay | Admitting: Gastroenterology

## 2008-11-07 ENCOUNTER — Encounter: Payer: Self-pay | Admitting: Gastroenterology

## 2008-11-09 ENCOUNTER — Encounter: Payer: Self-pay | Admitting: Family Medicine

## 2008-11-09 ENCOUNTER — Telehealth: Payer: Self-pay | Admitting: Gastroenterology

## 2008-11-19 ENCOUNTER — Ambulatory Visit: Payer: Self-pay | Admitting: Family Medicine

## 2008-11-26 HISTORY — PX: PARTIAL COLECTOMY: SHX5273

## 2008-11-28 ENCOUNTER — Encounter (INDEPENDENT_AMBULATORY_CARE_PROVIDER_SITE_OTHER): Payer: Self-pay | Admitting: General Surgery

## 2008-11-28 ENCOUNTER — Inpatient Hospital Stay (HOSPITAL_COMMUNITY): Admission: RE | Admit: 2008-11-28 | Discharge: 2008-12-03 | Payer: Self-pay | Admitting: General Surgery

## 2008-12-10 ENCOUNTER — Encounter: Payer: Self-pay | Admitting: Family Medicine

## 2008-12-13 ENCOUNTER — Ambulatory Visit: Payer: Self-pay | Admitting: Oncology

## 2008-12-18 ENCOUNTER — Encounter (INDEPENDENT_AMBULATORY_CARE_PROVIDER_SITE_OTHER): Payer: Self-pay | Admitting: *Deleted

## 2008-12-18 ENCOUNTER — Ambulatory Visit: Payer: Self-pay | Admitting: Family Medicine

## 2008-12-18 LAB — CONVERTED CEMR LAB: Hemoglobin: 9.7 g/dL — ABNORMAL LOW (ref 12.0–15.0)

## 2008-12-19 ENCOUNTER — Encounter: Payer: Self-pay | Admitting: Family Medicine

## 2008-12-24 ENCOUNTER — Telehealth: Payer: Self-pay | Admitting: Family Medicine

## 2008-12-27 ENCOUNTER — Encounter: Payer: Self-pay | Admitting: Family Medicine

## 2009-01-01 ENCOUNTER — Encounter: Payer: Self-pay | Admitting: Internal Medicine

## 2009-01-17 ENCOUNTER — Telehealth: Payer: Self-pay | Admitting: Family Medicine

## 2009-01-23 ENCOUNTER — Ambulatory Visit: Payer: Self-pay | Admitting: Family Medicine

## 2009-01-23 DIAGNOSIS — J309 Allergic rhinitis, unspecified: Secondary | ICD-10-CM

## 2009-02-27 ENCOUNTER — Encounter: Payer: Self-pay | Admitting: Family Medicine

## 2009-02-27 ENCOUNTER — Telehealth: Payer: Self-pay | Admitting: Family Medicine

## 2009-03-08 ENCOUNTER — Telehealth: Payer: Self-pay | Admitting: Family Medicine

## 2009-03-21 ENCOUNTER — Telehealth: Payer: Self-pay | Admitting: Gastroenterology

## 2009-03-25 ENCOUNTER — Telehealth: Payer: Self-pay | Admitting: Family Medicine

## 2009-03-25 ENCOUNTER — Ambulatory Visit: Payer: Self-pay | Admitting: Family Medicine

## 2009-03-28 LAB — CONVERTED CEMR LAB
AST: 18 units/L (ref 0–37)
Albumin: 3.5 g/dL (ref 3.5–5.2)
Alkaline Phosphatase: 57 units/L (ref 39–117)
Bilirubin, Direct: 0 mg/dL (ref 0.0–0.3)
Creatinine,U: 123.3 mg/dL
GFR calc non Af Amer: 43.17 mL/min (ref >60)
Glucose, Bld: 65 mg/dL — ABNORMAL LOW (ref 70–99)
Microalb Creat Ratio: 8.1 mg/g (ref 0.0–30.0)
Potassium: 3.4 meq/L — ABNORMAL LOW (ref 3.5–5.1)
Sodium: 146 meq/L — ABNORMAL HIGH (ref 135–145)
Total Bilirubin: 1.1 mg/dL (ref 0.3–1.2)
Total CHOL/HDL Ratio: 3
VLDL: 38.2 mg/dL (ref 0.0–40.0)

## 2009-04-04 ENCOUNTER — Ambulatory Visit: Payer: Self-pay | Admitting: Family Medicine

## 2009-04-04 LAB — CONVERTED CEMR LAB
Cholesterol, target level: 200 mg/dL
HDL goal, serum: 40 mg/dL
LDL Goal: 70 mg/dL

## 2009-04-23 ENCOUNTER — Ambulatory Visit: Payer: Self-pay | Admitting: Gastroenterology

## 2009-04-24 ENCOUNTER — Encounter: Payer: Self-pay | Admitting: Gastroenterology

## 2009-04-29 ENCOUNTER — Ambulatory Visit: Payer: Self-pay | Admitting: Family Medicine

## 2009-05-01 ENCOUNTER — Telehealth: Payer: Self-pay | Admitting: Family Medicine

## 2009-05-29 ENCOUNTER — Ambulatory Visit: Payer: Self-pay | Admitting: Gastroenterology

## 2009-06-25 ENCOUNTER — Ambulatory Visit: Payer: Self-pay | Admitting: Gastroenterology

## 2009-07-03 ENCOUNTER — Ambulatory Visit: Payer: Self-pay | Admitting: Family Medicine

## 2009-07-04 LAB — CONVERTED CEMR LAB
ALT: 46 units/L — ABNORMAL HIGH (ref 0–35)
AST: 33 units/L (ref 0–37)
BUN: 26 mg/dL — ABNORMAL HIGH (ref 6–23)
CO2: 26 meq/L (ref 19–32)
Chloride: 103 meq/L (ref 96–112)
Cholesterol: 136 mg/dL (ref 0–200)
Creatinine, Ser: 1.2 mg/dL (ref 0.4–1.2)
Direct LDL: 66 mg/dL
Glucose, Bld: 113 mg/dL — ABNORMAL HIGH (ref 70–99)
Hgb A1c MFr Bld: 6.1 % (ref 4.6–6.5)
Potassium: 4.6 meq/L (ref 3.5–5.1)
Total Protein: 7 g/dL (ref 6.0–8.3)

## 2009-07-12 ENCOUNTER — Ambulatory Visit: Payer: Self-pay | Admitting: Family Medicine

## 2009-08-28 HISTORY — PX: CARDIAC CATHETERIZATION: SHX172

## 2009-09-05 ENCOUNTER — Ambulatory Visit: Payer: Self-pay | Admitting: Internal Medicine

## 2009-09-06 ENCOUNTER — Ambulatory Visit (HOSPITAL_COMMUNITY): Admission: RE | Admit: 2009-09-06 | Discharge: 2009-09-06 | Payer: Self-pay | Admitting: Family Medicine

## 2009-09-10 ENCOUNTER — Telehealth (INDEPENDENT_AMBULATORY_CARE_PROVIDER_SITE_OTHER): Payer: Self-pay

## 2009-09-11 ENCOUNTER — Ambulatory Visit: Payer: Self-pay

## 2009-09-11 ENCOUNTER — Ambulatory Visit: Payer: Self-pay | Admitting: Internal Medicine

## 2009-09-11 ENCOUNTER — Encounter: Payer: Self-pay | Admitting: Internal Medicine

## 2009-09-11 ENCOUNTER — Encounter: Payer: Self-pay | Admitting: Cardiology

## 2009-09-11 ENCOUNTER — Encounter (HOSPITAL_COMMUNITY): Admission: RE | Admit: 2009-09-11 | Discharge: 2009-09-27 | Payer: Self-pay | Admitting: Internal Medicine

## 2009-09-13 ENCOUNTER — Encounter: Admission: RE | Admit: 2009-09-13 | Discharge: 2009-09-13 | Payer: Self-pay | Admitting: Family Medicine

## 2009-09-13 ENCOUNTER — Telehealth: Payer: Self-pay | Admitting: Internal Medicine

## 2009-09-13 ENCOUNTER — Encounter: Payer: Self-pay | Admitting: Family Medicine

## 2009-09-13 ENCOUNTER — Encounter: Payer: Self-pay | Admitting: Internal Medicine

## 2009-09-17 ENCOUNTER — Ambulatory Visit (HOSPITAL_COMMUNITY): Admission: RE | Admit: 2009-09-17 | Discharge: 2009-09-17 | Payer: Self-pay | Admitting: Internal Medicine

## 2009-09-17 ENCOUNTER — Ambulatory Visit: Payer: Self-pay | Admitting: Internal Medicine

## 2009-09-23 ENCOUNTER — Ambulatory Visit: Payer: Self-pay | Admitting: Family Medicine

## 2009-09-30 ENCOUNTER — Ambulatory Visit: Payer: Self-pay | Admitting: Family Medicine

## 2009-10-01 ENCOUNTER — Encounter: Payer: Self-pay | Admitting: Family Medicine

## 2009-10-11 ENCOUNTER — Ambulatory Visit: Payer: Self-pay | Admitting: Family Medicine

## 2009-10-14 ENCOUNTER — Ambulatory Visit: Payer: Self-pay | Admitting: Family Medicine

## 2009-10-17 LAB — CONVERTED CEMR LAB
ALT: 33 units/L (ref 0–35)
AST: 34 units/L (ref 0–37)
BUN: 10 mg/dL (ref 6–23)
Bilirubin, Direct: 0.1 mg/dL (ref 0.0–0.3)
CO2: 23 meq/L (ref 19–32)
GFR calc non Af Amer: 52.26 mL/min (ref >60)
Glucose, Bld: 114 mg/dL — ABNORMAL HIGH (ref 70–99)
Potassium: 4.7 meq/L (ref 3.5–5.1)
Sodium: 143 meq/L (ref 135–145)
Total Bilirubin: 1.1 mg/dL (ref 0.3–1.2)
Total CHOL/HDL Ratio: 3
Triglycerides: 207 mg/dL — ABNORMAL HIGH (ref 0.0–149.0)

## 2009-10-22 ENCOUNTER — Encounter (INDEPENDENT_AMBULATORY_CARE_PROVIDER_SITE_OTHER): Payer: Self-pay | Admitting: *Deleted

## 2009-10-29 ENCOUNTER — Other Ambulatory Visit: Admission: RE | Admit: 2009-10-29 | Discharge: 2009-10-29 | Payer: Self-pay | Admitting: Family Medicine

## 2009-10-29 ENCOUNTER — Ambulatory Visit: Payer: Self-pay | Admitting: Family Medicine

## 2009-10-30 ENCOUNTER — Telehealth: Payer: Self-pay | Admitting: Family Medicine

## 2009-10-31 ENCOUNTER — Encounter: Payer: Self-pay | Admitting: Family Medicine

## 2009-10-31 LAB — CONVERTED CEMR LAB: Pap Smear: NEGATIVE

## 2009-11-01 ENCOUNTER — Encounter: Payer: Self-pay | Admitting: Family Medicine

## 2009-11-04 ENCOUNTER — Encounter (INDEPENDENT_AMBULATORY_CARE_PROVIDER_SITE_OTHER): Payer: Self-pay | Admitting: *Deleted

## 2009-11-13 ENCOUNTER — Encounter: Payer: Self-pay | Admitting: Family Medicine

## 2009-12-06 ENCOUNTER — Encounter (INDEPENDENT_AMBULATORY_CARE_PROVIDER_SITE_OTHER): Payer: Self-pay | Admitting: *Deleted

## 2009-12-09 ENCOUNTER — Ambulatory Visit: Payer: Self-pay | Admitting: Gastroenterology

## 2009-12-18 ENCOUNTER — Ambulatory Visit: Payer: Self-pay | Admitting: Gastroenterology

## 2009-12-20 ENCOUNTER — Encounter: Payer: Self-pay | Admitting: Gastroenterology

## 2010-01-30 ENCOUNTER — Ambulatory Visit: Payer: Self-pay | Admitting: Family Medicine

## 2010-02-07 LAB — CONVERTED CEMR LAB
Cholesterol: 106 mg/dL (ref 0–200)
Direct LDL: 42.3 mg/dL
HDL: 33.3 mg/dL — ABNORMAL LOW (ref >39.00)
Total CHOL/HDL Ratio: 3
VLDL: 42 mg/dL — ABNORMAL HIGH (ref 0.0–40.0)

## 2010-02-21 ENCOUNTER — Telehealth: Payer: Self-pay | Admitting: Family Medicine

## 2010-02-28 ENCOUNTER — Telehealth: Payer: Self-pay | Admitting: Family Medicine

## 2010-03-21 ENCOUNTER — Ambulatory Visit: Payer: Self-pay | Admitting: Family Medicine

## 2010-03-26 ENCOUNTER — Telehealth: Payer: Self-pay | Admitting: Family Medicine

## 2010-03-28 ENCOUNTER — Encounter: Payer: Self-pay | Admitting: Family Medicine

## 2010-04-01 ENCOUNTER — Telehealth (INDEPENDENT_AMBULATORY_CARE_PROVIDER_SITE_OTHER): Payer: Self-pay | Admitting: *Deleted

## 2010-04-01 ENCOUNTER — Telehealth: Payer: Self-pay | Admitting: Internal Medicine

## 2010-04-02 ENCOUNTER — Encounter: Payer: Self-pay | Admitting: Internal Medicine

## 2010-04-17 ENCOUNTER — Telehealth: Payer: Self-pay | Admitting: Family Medicine

## 2010-04-24 ENCOUNTER — Ambulatory Visit: Payer: Self-pay | Admitting: Family Medicine

## 2010-04-28 HISTORY — PX: REPLACEMENT TOTAL KNEE: SUR1224

## 2010-04-29 ENCOUNTER — Inpatient Hospital Stay (HOSPITAL_COMMUNITY): Admission: RE | Admit: 2010-04-29 | Discharge: 2010-05-02 | Payer: Self-pay | Admitting: Orthopaedic Surgery

## 2010-05-23 ENCOUNTER — Encounter: Admission: RE | Admit: 2010-05-23 | Discharge: 2010-05-23 | Payer: Self-pay | Admitting: Internal Medicine

## 2010-05-25 ENCOUNTER — Inpatient Hospital Stay (HOSPITAL_COMMUNITY): Admission: EM | Admit: 2010-05-25 | Discharge: 2010-05-31 | Payer: Self-pay | Admitting: Internal Medicine

## 2010-05-26 ENCOUNTER — Encounter (INDEPENDENT_AMBULATORY_CARE_PROVIDER_SITE_OTHER): Payer: Self-pay | Admitting: Orthopaedic Surgery

## 2010-05-26 ENCOUNTER — Ambulatory Visit: Payer: Self-pay | Admitting: Vascular Surgery

## 2010-06-10 ENCOUNTER — Encounter: Payer: Self-pay | Admitting: Family Medicine

## 2010-07-09 ENCOUNTER — Ambulatory Visit: Payer: Self-pay | Admitting: Family Medicine

## 2010-07-11 ENCOUNTER — Telehealth: Payer: Self-pay | Admitting: Family Medicine

## 2010-07-11 LAB — CONVERTED CEMR LAB
ALT: 23 units/L (ref 0–35)
AST: 23 units/L (ref 0–37)
Alkaline Phosphatase: 74 units/L (ref 39–117)
Bilirubin, Direct: 0.1 mg/dL (ref 0.0–0.3)
Chloride: 110 meq/L (ref 96–112)
Direct LDL: 31.6 mg/dL
GFR calc non Af Amer: 71.18 mL/min (ref >60)
HDL: 32.5 mg/dL — ABNORMAL LOW (ref >39.00)
Hgb A1c MFr Bld: 5.7 % (ref 4.6–6.5)
Microalb Creat Ratio: 2.6 mg/g (ref 0.0–30.0)
Potassium: 4.2 meq/L (ref 3.5–5.1)
Sodium: 145 meq/L (ref 135–145)
Total Bilirubin: 0.6 mg/dL (ref 0.3–1.2)
Total CHOL/HDL Ratio: 3
VLDL: 46.6 mg/dL — ABNORMAL HIGH (ref 0.0–40.0)

## 2010-07-17 ENCOUNTER — Ambulatory Visit: Payer: Self-pay | Admitting: Family Medicine

## 2010-07-22 ENCOUNTER — Telehealth: Payer: Self-pay | Admitting: Family Medicine

## 2010-09-08 ENCOUNTER — Encounter: Payer: Self-pay | Admitting: Internal Medicine

## 2010-09-08 ENCOUNTER — Ambulatory Visit: Payer: Self-pay | Admitting: Internal Medicine

## 2010-09-15 ENCOUNTER — Ambulatory Visit (HOSPITAL_COMMUNITY)
Admission: RE | Admit: 2010-09-15 | Discharge: 2010-09-15 | Payer: Self-pay | Source: Home / Self Care | Attending: Family Medicine | Admitting: Family Medicine

## 2010-10-09 ENCOUNTER — Telehealth: Payer: Self-pay | Admitting: Family Medicine

## 2010-10-09 ENCOUNTER — Other Ambulatory Visit: Payer: Self-pay | Admitting: Family Medicine

## 2010-10-09 ENCOUNTER — Ambulatory Visit
Admission: RE | Admit: 2010-10-09 | Discharge: 2010-10-09 | Payer: Self-pay | Source: Home / Self Care | Attending: Family Medicine | Admitting: Family Medicine

## 2010-10-09 LAB — HEMOGLOBIN A1C: Hgb A1c MFr Bld: 6.2 % (ref 4.6–6.5)

## 2010-10-10 ENCOUNTER — Encounter: Payer: Self-pay | Admitting: Family Medicine

## 2010-10-17 ENCOUNTER — Ambulatory Visit
Admission: RE | Admit: 2010-10-17 | Discharge: 2010-10-17 | Payer: Self-pay | Source: Home / Self Care | Attending: Family Medicine | Admitting: Family Medicine

## 2010-10-17 DIAGNOSIS — I4891 Unspecified atrial fibrillation: Secondary | ICD-10-CM | POA: Insufficient documentation

## 2010-10-19 ENCOUNTER — Encounter: Payer: Self-pay | Admitting: Family Medicine

## 2010-10-28 NOTE — Letter (Signed)
Summary: Previsit letter  [facility]  [place], [place] [address]   Phone: [phone]  Fax: [phone]       11/04/2009 MRN: [id number]  [name] [place], [place]  [address]  Dear [name],  Welcome to the Gastroenterology Division at [facility].    You are scheduled to see a nurse for your pre-procedure visit on 12/09/2009 at 10:00AM on the 3rd floor at [facility], [place] [employer].  We ask that you try to arrive at our office 15 minutes prior to your appointment time to allow for check-in.  Your nurse visit will consist of discussing your medical and surgical history, your immediate family medical history, and your medications.    Please bring a complete list of all your medications or, if you prefer, bring the medication bottles and we will list them.  We will need to be aware of both prescribed and over the counter drugs.  We will need to know exact dosage information as well.  If you are on blood thinners (Coumadin, Plavix, Aggrenox, Ticlid, etc.) please call our office today/prior to your appointment, as we need to consult with your physician about holding your medication.   Please be prepared to read and sign documents such as consent forms, a financial agreement, and acknowledgement forms.  If necessary, and with your consent, a friend or relative is welcome to sit-in on the nurse visit with you.  Please bring your insurance card so that we may make a copy of it.  If your insurance requires a referral to see a specialist, please bring your referral form from your primary care physician.  No co-pay is required for this nurse visit.     If you cannot keep your appointment, please call [phone] to cancel or reschedule prior to your appointment date.  This allows [place] the opportunity to schedule an appointment for another patient in need of care.    Thank you for choosing [facility] for your  medical needs.  We appreciate the opportunity to care for you.  Please visit [place] at our website  to learn more about our practice.                     Sincerely.                                                                                                                   The Gastroenterology Division

## 2010-10-28 NOTE — Assessment & Plan Note (Signed)
Summary: SINUS INF/DLO   Vital Signs:  Patient profile:   70 year old female Height:      66.25 inches Weight:      174.2 pounds BMI:     28.01 Temp:     98.2 degrees F oral Pulse rate:   68 / minute Pulse rhythm:   regular BP sitting:   140 / 78  (left arm) Cuff size:   regular  Vitals Entered By: Zenda Alpers CMA Deborra Medina) (September 30, 2009 9:27 AM) CC: ? sinus infection Is Patient Diabetic? Yes Did you bring your meter with you today? No   History of Present Illness: !2/27 Seen Dx with acute sinusitis.Marland Kitchentreated with Z pack given allergy profile. No change in symptoms with antibitoic.  Severe headache on right side of head no improvement. Now with nausea  4-5 days ago noted rash appear on right side of face..red bumps but blisters at hairline. Also nausea.Marland KitchenMarland KitchenNo vomiting. Fatigued. Continued yellow  nasal discharge ..some bloody nose on right. Right ear pain.  Cough is much better.  Fever resolved.   Never recived shingles vaccine.   Problems Prior to Update: 1)  Breast Mass, Left  (ICD-611.72) 2)  Dyspnea  (ICD-786.05) 3)  Flushing  (ICD-782.62) 4)  Atrial Fibrillation  (ICD-427.31) 5)  Peripheral Edema  (ICD-782.3) 6)  Coronary Artery Disease  (ICD-414.00) 7)  Breast Pain, Left  (ICD-611.71) 8)  Hyperlipidemia  (ICD-272.4) 9)  Hypertension  (ICD-401.9) 10)  Sinusitis - Acute-nos  (ICD-461.9) 11)  Allergic Rhinitis  (ICD-477.9) 12)  Hemoccult Positive Stool  (ICD-578.1) 13)  Adenocarcinoma, Colon, Cecum  (ICD-153.4) 14)  Anemia  (ICD-285.9) 15)  Sciatica  (ICD-724.3) 16)  Cystitis, Acute  (ICD-595.0) 17)  Diarrhea, Chronic  (ICD-787.91) 18)  Nausea  (ICD-787.02) 19)  Gerd  (ICD-530.81) 20)  Depression  (ICD-311) 21)  Anxiety  (ICD-300.00) 22)  Osteoarthritis  (ICD-715.90) 23)  Ibs  (ICD-564.1) 24)  Degenerative Joint Disease  (ICD-715.90) 25)  Syndrome, Carpal Tunnel  (ICD-354.0) 26)  Diabetes Mellitus, Type II  (ICD-250.00) 27)  Family History Diabetes  1st Degree Relative  (ICD-V18.0)  Current Medications (verified): 1)  Glucovance 2.5-500 Mg Tabs (Glyburide-Metformin) .... Take 1 By Mouth Two Times A Day 2)  Lipitor 80 Mg Tabs (Atorvastatin Calcium) .... Take 1 Tab By Mouth Daily 3)  Cartia Xt 240 Mg Cp24 (Diltiazem Hcl Coated Beads) .... Take 1 Tab By Mouth Daily 4)  Omeprazole 20 Mg Cpdr (Omeprazole) .... Take 2 Tablet By Mouth Once A Day 5)  Klor-Con M20 20 Meq  Tbcr (Potassium Chloride Crys Cr) .... Take 1 Tab By Mouth Two Times A Day 6)  Oscal 500/200 D-3 500-200 Mg-Unit  Tabs (Calcium-Vitamin D) .... Take 1 Tablet By Mouth Two Times A Day 7)  Fish Oil   Oil (Fish Oil) .... Take 1 Capsule By Mouth Two Times A Day 8)  Multivitamins   Tabs (Multiple Vitamin) .Marland Kitchen.. 1 By Mouth Once Daily 9)  Coreg 12.5 Mg  Tabs (Carvedilol) .... Take 1 Tablet By Mouth Two Times A Day 10)  Celexa 40 Mg Tabs (Citalopram Hydrobromide) .... Take 1 Tab By Mouth Every Day 11)  Lisinopril-Hydrochlorothiazide 20-12.5 Mg Tabs (Lisinopril-Hydrochlorothiazide) .... Take 2 Tablet By Mouth Once A Day 12)  Ferrous Sulfate 325 (65 Fe) Mg Tabs (Ferrous Sulfate) .Marland Kitchen.. 1 Tab By Mouth Two Times A Day 13)  Nabumetone 750 Mg Tabs (Nabumetone) .... Take 1 Tablet By Mouth Two Times A Day 14)  Nitroglycerin 0.3 Mg Subl (Nitroglycerin) .Marland KitchenMarland KitchenMarland Kitchen  Use As Directed 15)  Cholestyramine Light 4 Gm/dose  Powd (Cholestyramine Light) .... Take 1 Packet Twice A Day  Allergies (verified): 1)  ! Augmentin (Amoxicillin-Pot Clavulanate)  Past History:  Past medical, surgical, family and social histories (including risk factors) reviewed, and no changes noted (except as noted below).  Past Medical History: Reviewed history from 09/03/2009 and no changes required. 1. ATRIAL FIBRILLATION  2. PERIPHERAL EDEMA   3. CORONARY ARTERY DISEASE 4. BREAST PAIN, LEFT 5. HYPERLIPIDEMIA   6. HYPERTENSION   7. SINUSITIS - ACUTE-NOS  8. ALLERGIC RHINITIS   9. HEMOCCULT POSITIVE STOOL   10.  ADENOCARCINOMA, COLON, CECUM   11. ANEMIA   12. SCIATICA  13. CYSTITIS, ACUTE  14. DIARRHEA, CHRONIC   15. NAUSEA  16. GERD  17. DEPRESSION  18. ANXIETY  19. OSTEOARTHRITIS   20. IBS   21. DEGENERATIVE JOINT DISEASE  22. SYNDROME, CARPAL TUNNEL  23. DIABETES MELLITUS, TYPE II  24. FAMILY HISTORY DIABETES 1ST DEGREE RELATIVE    Past Surgical History: Reviewed history from 12/18/2008 and no changes required. Arthroscopy both knees EGD 10-20-04, gastritis colonoscopy 10-16-04 per Dr. Carlean Purl, repeat in 5 yrs Cholecystectomy 12/2005 stent, Dr Zoila Shutter 11/2008 partial right colectomy for adenocarcinoma  Family History: Reviewed history from 11/23/2007 and no changes required. Family History Diabetes 1st degree relative Family History Hypertension mother; massive MI age 33, prostate cancer father, massive MI age 59  Social History: Reviewed history from 11/23/2007 and no changes required. Retired Single, divorced Never Smoked Alcohol use-no Drug use-no Regular exercise-no, limited due to knees Diet: addicted to sweets, some fruit and veggies, water daily  Review of Systems General:  Complains of fatigue. CV:  Denies chest pain or discomfort. Resp:  Denies sputum productive and wheezing. GI:  Denies abdominal pain. GU:  Denies abnormal vaginal bleeding.  Physical Exam  General:  fatigued appearing in NAD Eyes:  No corneal or conjunctival inflammation noted. EOMI. Perrla. Funduscopic exam benign, without hemorrhages, exudates or papilledema. Vision grossly normal. Ears:  External ear exam shows no significant lesions or deformities.  Otoscopic examination reveals clear canals, tympanic membranes are intact bilaterally without bulging, retraction, inflammation or discharge. Hearing is grossly normal bilaterally. Nose:  External nasal examination shows no deformity or inflammation. Nasal mucosa are pink and moist without lesions or exudates. Mouth:  Oral mucosa and  oropharynx without lesions or exudates.  MMM Neck:  no carotid bruit or thyromegaly no cervical or supraclavicular lymphadenopathy  Lungs:  Normal respiratory effort, chest expands symmetrically. Lungs are clear to auscultation, no crackles or wheezes. Heart:  Normal rate and regular rhythm. S1 and S2 normal without gallop, murmur, click, rub or other extra sounds. Skin:  right face ttp to gentle touch...blistering rash on erythematous back ground over right forehead and arund eye.Marland Kitchendermatomal, unilateral    Impression & Recommendations:  Problem # 1:  HERPES ZOSTER (ICD-053.9) This is likely cause of continued severe headache and blistering rash on right forehead and near eye. Given location will treat with antivirals even though out of treatment window. Go to eye MD for eye eval. Pain control with tramadol (throw away Darvocet since off market). Call if pain not well controlled.  Spent 20 minutes face to face discussing diagnosis treatment and avoidance of immunocomp/kids/no vaccinated.   Problem # 2:  SINUSITIS - ACUTE-NOS (ICD-461.9) No clear ongoing bacterial infection.Marland Kitchenalot of symtpoms resolved with time and antibitics last week, but she will let me know if fefer or purulent nasal discharge occurs.  Problem # 3:  nAUSEA (ICD-787.02)a MAy use phenergan she has at home for nausea or call for Zofran if it is making her too sleepy.   Complete Medication List: 1)  Glucovance 2.5-500 Mg Tabs (Glyburide-metformin) .... Take 1 by mouth two times a day 2)  Lipitor 80 Mg Tabs (Atorvastatin calcium) .... Take 1 tab by mouth daily 3)  Cartia Xt 240 Mg Cp24 (Diltiazem hcl coated beads) .... Take 1 tab by mouth daily 4)  Omeprazole 20 Mg Cpdr (Omeprazole) .... Take 2 tablet by mouth once a day 5)  Klor-con M20 20 Meq Tbcr (Potassium chloride crys cr) .... Take 1 tab by mouth two times a day 6)  Oscal 500/200 D-3 500-200 Mg-unit Tabs (Calcium-vitamin d) .... Take 1 tablet by mouth two times a  day 7)  Fish Oil Oil (Fish oil) .... Take 1 capsule by mouth two times a day 8)  Multivitamins Tabs (Multiple vitamin) .Marland Kitchen.. 1 by mouth once daily 9)  Coreg 12.5 Mg Tabs (Carvedilol) .... Take 1 tablet by mouth two times a day 10)  Celexa 40 Mg Tabs (Citalopram hydrobromide) .... Take 1 tab by mouth every day 11)  Lisinopril-hydrochlorothiazide 20-12.5 Mg Tabs (Lisinopril-hydrochlorothiazide) .... Take 2 tablet by mouth once a day 12)  Ferrous Sulfate 325 (65 Fe) Mg Tabs (Ferrous sulfate) .Marland Kitchen.. 1 tab by mouth two times a day 13)  Nabumetone 750 Mg Tabs (Nabumetone) .... Take 1 tablet by mouth two times a day 14)  Nitroglycerin 0.3 Mg Subl (Nitroglycerin) .... Use as directed 15)  Cholestyramine Light 4 Gm/dose Powd (Cholestyramine light) .... Take 1 packet twice a day 16)  Valacyclovir Hcl 1 Gm Tabs (Valacyclovir hcl) .Marland Kitchen.. 1 tab by mouth three times a day x 7 days 17)  Tramadol Hcl 50 Mg Tabs (Tramadol hcl) .Marland Kitchen.. 1 tab by mouth three times a day as needed pain, call if pain not controlled  Patient Instructions: 1)  MAke appt ASAP to have right eye evaluated by Dr. Katy Fitch given concern for shingles.  2)  Throw away Darvocet.  3)  Call if fever, green nasal dicsharge or if pain not well controlled. 4)  Make follow up appt in 10 days shingles.  Prescriptions: TRAMADOL HCL 50 MG TABS (TRAMADOL HCL) 1 tab by mouth three times a day as needed pain, call if pain not controlled  #30 x 0   Entered and Authorized by:   [name] MD   Signed by:   [name] MD on 09/30/2009   Method used:   Print then Give to Patient   RxID:   [id number] VALACYCLOVIR HCL 1 GM TABS (VALACYCLOVIR HCL) 1 tab by mouth three times a day x 7 days  #21 x 0   Entered and Authorized by:   [name] MD   Signed by:   [name] MD on 09/30/2009   Method used:   Electronically to        [facility] (retail)       [phone]       [place], [place]  [address]        Ph: [phone]       Fax: [phone]   RxID:   [id number]   Appended Document: Orders Update    Clinical Lists Changes  Observations: Added new observation of HEMOCULTDUE: Not Indicated (10/11/2009 12:53) Added new observation of PNEUMVXNEXT: 09/28/2008 (10/11/2009 12:53) Added new observation of HZOSTERNEXT: Refused  (10/11/2009) (  10/11/2009 12:53)      Herpes Zoster Next Due:  Refused Last Hemoccult Result: abnormal (10/04/2008 5:18:00 PM) Hemoccult Next Due:  Not Indicated

## 2010-10-28 NOTE — Assessment & Plan Note (Signed)
Summary: F/U/CLE   Vital Signs:  Patient profile:   70 year old female Height:      66.25 inches Weight:      164.0 pounds BMI:     26.37 Temp:     98.6 degrees F oral Pulse rate:   72 / minute Pulse rhythm:   regular BP sitting:   130 / 90  (left arm) Cuff size:   regular  Vitals Entered By: Zenda Alpers CMA Deborra Medina) (July 17, 2010 9:17 AM)  History of Present Illness: Chief complaint follow up   CAD, afib: per Cards..next OV 12/12  High chol: on lipitor . LDL  at goal <70. Triglycerides not at goal.   DM, well controlled on glucovance. FBS 60-80 in AMs.   HTN on cartia, coreg, lisinopril... At home and in rehab 130/70s.   Improved energy...  Recent right knee replacement 8/2.Marland KitchenMarland Kitchencopmplicated by bleedin in knee orthosis.  Surgeon Dr. Tanda Rockers.  Currently doing PT...improving slowly. No falls.   Depresison and anxiety. Well controlled.Marland Kitcheneweaned off mirtazapine.   Has lost weight 179 to 164.  eating three meals a day, healthy foods.   LAst colonoscopy in spring 2011..nml..next in 3 years. Hx of colon adenocarcinoma.   Problems Prior to Update: 1)  Candidiasis of Skin and Nails  (ICD-112.3) 2)  Routine Gynecological Examination  (ICD-V72.31) 3)  Herpes Zoster  (ICD-053.9) 4)  Atrial Fibrillation  (ICD-427.31) 5)  Peripheral Edema  (ICD-782.3) 6)  Coronary Artery Disease  (ICD-414.00) 7)  Hyperlipidemia  (ICD-272.4) 8)  Hypertension  (ICD-401.9) 9)  Allergic Rhinitis  (ICD-477.9) 10)  Adenocarcinoma, Colon, Cecum  (ICD-153.4) 11)  Anemia  (ICD-285.9) 12)  Gerd  (ICD-530.81) 13)  Depression  (ICD-311) 14)  Anxiety  (ICD-300.00) 15)  Osteoarthritis  (ICD-715.90) 16)  Ibs  (ICD-564.1) 17)  Degenerative Joint Disease  (ICD-715.90) 18)  Syndrome, Carpal Tunnel  (ICD-354.0) 19)  Diabetes Mellitus, Type II  (ICD-250.00) 20)  Family History Diabetes 1st Degree Relative  (ICD-V18.0)  Current Medications (verified): 1)  Glucovance 2.5-500 Mg Tabs  (Glyburide-Metformin) .... Take 1 By Mouth Two Times A Day 2)  Lipitor 80 Mg Tabs (Atorvastatin Calcium) .... Take 1 Tab By Mouth Daily 3)  Cartia Xt 240 Mg Cp24 (Diltiazem Hcl Coated Beads) .... Take 1 Tab By Mouth Daily 4)  Omeprazole 20 Mg Cpdr (Omeprazole) .... Take 2 Tablet By Mouth Once A Day 5)  Klor-Con M20 20 Meq  Tbcr (Potassium Chloride Crys Cr) .... Take 1 Tab By Mouth Two Times A Day 6)  Oscal 500/200 D-3 500-200 Mg-Unit  Tabs (Calcium-Vitamin D) .... Take 1 Tablet By Mouth Two Times A Day 7)  Fish Oil   Oil (Fish Oil) .... Take 1 Capsule By Mouth Two Times A Day 8)  Multivitamins   Tabs (Multiple Vitamin) .Marland Kitchen.. 1 By Mouth Once Daily 9)  Coreg 12.5 Mg  Tabs (Carvedilol) .... Take 1 Tablet By Mouth Two Times A Day 10)  Celexa 40 Mg Tabs (Citalopram Hydrobromide) .... Take 1 Tab By Mouth Every Day 11)  Lisinopril-Hydrochlorothiazide 20-12.5 Mg Tabs (Lisinopril-Hydrochlorothiazide) .... Take 2 Tablet By Mouth Once A Day 12)  Ferrous Sulfate 325 (65 Fe) Mg Tabs (Ferrous Sulfate) .Marland Kitchen.. 1 Tab By Mouth Two Times A Day 13)  Nabumetone 750 Mg Tabs (Nabumetone) .... Take 1 Tablet By Mouth Two Times A Day 14)  Nitroglycerin 0.3 Mg Subl (Nitroglycerin) .... Use As Directed 15)  Cholestyramine Light 4 Gm/dose  Powd (Cholestyramine Light) .... Take 1 Packet Twice A Day  16)  Fluconazole 200 Mg Tabs (Fluconazole) .... 2 Tabs By Mouth X 1 17)  Nystatin 100000 Unit/gm Crea (Nystatin) .... Apply Three Times A Day As Directed Until Rash Completely Gone and For 5 Days After This  Allergies (verified): 1)  ! Augmentin  Past History:  Past medical, surgical, family and social histories (including risk factors) reviewed, and no changes noted (except as noted below).  Past Medical History: Reviewed history from 09/03/2009 and no changes required. 1. ATRIAL FIBRILLATION  2. PERIPHERAL EDEMA   3. CORONARY ARTERY DISEASE 4. BREAST PAIN, LEFT 5. HYPERLIPIDEMIA   6. HYPERTENSION   7. SINUSITIS -  ACUTE-NOS  8. ALLERGIC RHINITIS   9. HEMOCCULT POSITIVE STOOL   10. ADENOCARCINOMA, COLON, CECUM   11. ANEMIA   12. SCIATICA  13. CYSTITIS, ACUTE  14. DIARRHEA, CHRONIC   15. NAUSEA  16. GERD  17. DEPRESSION  18. ANXIETY  19. OSTEOARTHRITIS   20. IBS   21. DEGENERATIVE JOINT DISEASE  22. SYNDROME, CARPAL TUNNEL  23. DIABETES MELLITUS, TYPE II  24. FAMILY HISTORY DIABETES 1ST DEGREE RELATIVE    Past Surgical History: Arthroscopy both knees EGD 10-20-04, gastritis colonoscopy 10-16-04 per Dr. Carlean Purl, repeat in 5 yrs Cholecystectomy 12/2005 stent, Dr Zoila Shutter 11/2008 partial right colectomy for adenocarcinoma 04/2010 right knee replacement, complicated by hemarthrosis and second surgery for hematoma evacuation  Family History: Reviewed history from 11/23/2007 and no changes required. Family History Diabetes 1st degree relative Family History Hypertension mother; massive MI age 43, prostate cancer father, massive MI age 67  Social History: Reviewed history from 11/23/2007 and no changes required. Retired Single, divorced Never Smoked Alcohol use-no Drug use-no Regular exercise-no, limited due to knees Diet: addicted to sweets, some fruit and veggies, water daily  Review of Systems General:  Denies fatigue and fever. CV:  Denies chest pain or discomfort. Resp:  Denies shortness of breath. GI:  Denies abdominal pain. GU:  Denies dysuria.  Physical Exam  General:  Well-developed,well-nourished,in no acute distress; alert,appropriate and cooperative throughout examination Mouth:  Oral mucosa and oropharynx without lesions or exudates.  Teeth in good repair. Neck:  no carotid bruit or thyromegaly no cervical or supraclavicular lymphadenopathy  Lungs:  Normal respiratory effort, chest expands symmetrically. Lungs are clear to auscultation, no crackles or wheezes. Heart:  Normal rate and regular rhythm. S1 and S2 normal without gallop, murmur, click, rub or other extra  sounds. Abdomen:  Bowel sounds positive,abdomen soft and non-tender without masses, organomegaly or hernias noted. Pulses:  R and L posterior tibial pulses are full and equal bilaterally  Extremities:  no edmea   Diabetes Management Exam:    Foot Exam (with socks and/or shoes not present):       Sensory-Pinprick/Light touch:          Left medial foot (L-4): normal          Left dorsal foot (L-5): normal          Left lateral foot (S-1): normal          Right medial foot (L-4): normal          Right dorsal foot (L-5): normal          Right lateral foot (S-1): normal       Sensory-Monofilament:          Left foot: normal          Right foot: normal       Inspection:  Left foot: normal          Right foot: normal       Nails:          Left foot: normal          Right foot: normal   Impression & Recommendations:  Problem # 1:  HYPERLIPIDEMIA (ICD-272.4)  LDL at goal but triglycerieds up. Will restart fish oil at higher dose she was taking prior to recent knee surgery.  Her updated medication list for this problem includes:    Lipitor 80 Mg Tabs (Atorvastatin calcium) .Marland Kitchen... Take 1 tab by mouth daily    Cholestyramine Light 4 Gm/dose Powd (Cholestyramine light) .Marland Kitchen... Take 1 packet twice a day  Labs Reviewed: SGOT: 23 (07/09/2010)   SGPT: 23 (07/09/2010)  Lipid Goals: Chol Goal: 200 (04/04/2009)   HDL Goal: 40 (04/04/2009)   LDL Goal: 70 (04/04/2009)   TG Goal: 150 (04/04/2009)  Prior 10 Yr Risk Heart Disease: N/A (12/21/2007)   HDL:32.50 (07/09/2010), 33.30 (01/30/2010)  LDL:20 (03/25/2009), 56 (09/14/2008)  Chol:98 (07/09/2010), 106 (01/30/2010)  Trig:233.0 (07/09/2010), 210.0 (01/30/2010)  Problem # 2:  HYPERTENSION (ICD-401.9)  Well controlled. Continue current medication.  Her updated medication list for this problem includes:    Cartia Xt 240 Mg Cp24 (Diltiazem hcl coated beads) .Marland Kitchen... Take 1 tab by mouth daily    Coreg 12.5 Mg Tabs (Carvedilol) .Marland Kitchen... Take 1  tablet by mouth two times a day    Lisinopril-hydrochlorothiazide 20-12.5 Mg Tabs (Lisinopril-hydrochlorothiazide) .Marland Kitchen... Take 2 tablet by mouth once a day  BP today: 130/90 Prior BP: 140/70 (04/24/2010)  Prior 10 Yr Risk Heart Disease: N/A (12/21/2007)  Labs Reviewed: K+: 4.2 (07/09/2010) Creat: : 0.8 (07/09/2010)   Chol: 98 (07/09/2010)   HDL: 32.50 (07/09/2010)   LDL: 20 (03/25/2009)   TG: 233.0 (07/09/2010)  Problem # 3:  DEPRESSION (ICD-311) Well controlled on medicaiton. Her updated medication list for this problem includes:    Celexa 40 Mg Tabs (Citalopram hydrobromide) .Marland Kitchen... Take 1 tab by mouth every day  Problem # 4:  DIABETES MELLITUS, TYPE II (ICD-250.00) Dramatic improvement with weight loss, almost nml range...decrease glucovance to avoid lows. if continued weight loss may be able to decrease medicaiotn further/stop. Follow FBS closely at home.  Her updated medication list for this problem includes:    Glucovance 1.25-250 Mg Tabs (Glyburide-metformin) .Marland Kitchen... 1 tab by mouth two times a day    Lisinopril-hydrochlorothiazide 20-12.5 Mg Tabs (Lisinopril-hydrochlorothiazide) .Marland Kitchen... Take 2 tablet by mouth once a day  Problem # 5:  CORONARY ARTERY DISEASE (ICD-414.00) per cards, assymptomatic Her updated medication list for this problem includes:    Cartia Xt 240 Mg Cp24 (Diltiazem hcl coated beads) .Marland Kitchen... Take 1 tab by mouth daily    Coreg 12.5 Mg Tabs (Carvedilol) .Marland Kitchen... Take 1 tablet by mouth two times a day    Lisinopril-hydrochlorothiazide 20-12.5 Mg Tabs (Lisinopril-hydrochlorothiazide) .Marland Kitchen... Take 2 tablet by mouth once a day    Nitroglycerin 0.3 Mg Subl (Nitroglycerin) ..... Use as directed  Problem # 6:  ATRIAL FIBRILLATION (ICD-427.31)  Her updated medication list for this problem includes:    Cartia Xt 240 Mg Cp24 (Diltiazem hcl coated beads) .Marland Kitchen... Take 1 tab by mouth daily    Coreg 12.5 Mg Tabs (Carvedilol) .Marland Kitchen... Take 1 tablet by mouth two times a day  Complete  Medication List: 1)  Glucovance 1.25-250 Mg Tabs (Glyburide-metformin) .Marland Kitchen.. 1 tab by mouth two times a day 2)  Lipitor 80 Mg Tabs (Atorvastatin calcium) .... Take  1 tab by mouth daily 3)  Cartia Xt 240 Mg Cp24 (Diltiazem hcl coated beads) .... Take 1 tab by mouth daily 4)  Omeprazole 20 Mg Cpdr (Omeprazole) .... Take 2 tablet by mouth once a day 5)  Klor-con M20 20 Meq Tbcr (Potassium chloride crys cr) .... Take 1 tab by mouth two times a day 6)  Oscal 500/200 D-3 500-200 Mg-unit Tabs (Calcium-vitamin d) .... Take 1 tablet by mouth two times a day 7)  Fish Oil Oil (Fish oil) .... Take 1 capsule by mouth two times a day 8)  Multivitamins Tabs (Multiple vitamin) .Marland Kitchen.. 1 by mouth once daily 9)  Coreg 12.5 Mg Tabs (Carvedilol) .... Take 1 tablet by mouth two times a day 10)  Celexa 40 Mg Tabs (Citalopram hydrobromide) .... Take 1 tab by mouth every day 11)  Lisinopril-hydrochlorothiazide 20-12.5 Mg Tabs (Lisinopril-hydrochlorothiazide) .... Take 2 tablet by mouth once a day 12)  Ferrous Sulfate 325 (65 Fe) Mg Tabs (Ferrous sulfate) .Marland Kitchen.. 1 tab by mouth two times a day 13)  Nabumetone 750 Mg Tabs (Nabumetone) .... Take 1 tablet by mouth two times a day 14)  Nitroglycerin 0.3 Mg Subl (Nitroglycerin) .... Use as directed 15)  Cholestyramine Light 4 Gm/dose Powd (Cholestyramine light) .... Take 1 packet twice a day 16)  Fluconazole 200 Mg Tabs (Fluconazole) .... 2 tabs by mouth x 1 17)  Nystatin 100000 Unit/gm Crea (Nystatin) .... Apply three times a day as directed until rash completely gone and for 5 days after this  Other Orders: Influenza Vaccine MCR [id number])  Patient Instructions: 1)  Decrease glucovance to 1.25/250 mg tabs by mouth two times a day. 2)  Please schedule a follow-up appointment in 3 months .  3)  HgBA1c prior to visit  ICD-9:  4)  Increase fish oil 3000-4000 mg divdided daily. 5)  Increase protein in diet... can use glucerna.  6)  Increase calcium back to two a day...eat a lot of  skim milk, low fat cheese, yogurt.  Prescriptions: GLUCOVANCE 1.25-250 MG TABS (GLYBURIDE-METFORMIN) 1 tab by mouth two times a day  #60 x 3   Entered and Authorized by:   [name] MD   Signed by:   [name] MD on 07/17/2010   Method used:   [facility]ronically to        [facility] ([facility]ail[facility]       [place], [place]  [address]       Ph: [phone]       Fax: [phone]   RxID:   [id number] GLUCOVANCE 1.25-250 MG TABS (GLYBURIDE-METFORMIN) 1 tab by mouth two times a day  #60 x 3   Entered and Authorized by:   [name] MD   Signed by:   [name] MD on 07/17/2010   Method used:   Electronically to        [facility] (retail)       [phone]       [place], [place]  [address]       Ph: [phone]       Fax: [phone]   RxID:   [id number]    Orders Added: 1)  Influenza Vaccine MCR [[id number]] 2)  Est. Patient Level IV [id number]   Immunizations Administered:  Influenza Vaccine # 1:    Vaccine Type: Fluvax MCR  Site: left deltoid    Mfr: GlaxoSmithKline    Dose: 0.5 ml    Route: IM    Given by: Zenda Alpers CMA ([place])    Exp. Date: 03/28/2011    Lot #: [id number]    VIS given: 04/22/10 version given July 17, 2010.  Flu Vaccine Consent Questions:    Do you have a history of severe allergic reactions to this vaccine? no    Any prior history of allergic reactions to egg and/or gelatin? no    Do you have a sensitivity to the preservative Thimersol? no    Do you have a past history of Guillan-Barre Syndrome? no    Do you currently have an acute febrile illness? no    Have you ever had a severe reaction to latex? no    Vaccine information given and explained to patient? yes    Are you currently pregnant? no   Immunizations Administered:  Influenza Vaccine # 1:    Vaccine Type: Fluvax MCR    Site: left deltoid    Mfr: GlaxoSmithKline     Dose: 0.5 ml    Route: IM    Given by: Zenda Alpers CMA ([place])    Exp. Date: 03/28/2011    Lot #: [id number]    VIS given: 04/22/10 version given July 17, 2010.

## 2010-10-28 NOTE — Letter (Signed)
Summary: Colonoscopy Letter  [facility]  [place], [place] [address]   Phone: [phone]  Fax: [phone]      October 22, 2009 MRN: [id number]   DERA FEULING [address] [place], [place]  [address]   Dear Ms. RABUCK,   According to your medical record, it is time for you to schedule a Colonoscopy. The American Cancer Society recommends this procedure as a method to detect early colon cancer. Patients with a family history of colon cancer, or a personal history of colon polyps or inflammatory bowel disease are at increased risk.  This letter has beeen generated based on the recommendations made at the time of your procedure. If you feel that in your particular situation this may no longer apply, please contact our office.  Please call our office at [phone] to schedule this appointment or to update your records at your earliest convenience.  Thank you for cooperating with [place] to provide you with the very best care possible.   Sincerely,  Milus Banister, M.D.  [facility]astroenterology [facility] [phone]

## 2010-10-28 NOTE — Procedures (Signed)
Summary: Colonoscopy  Patient: [name] Note: All result statuses are Final unless otherwise noted.  Tests: (1) Colonoscopy (COL)   COL Colonoscopy           [facility]     [place], [place]  [address]           COLONOSCOPY PROCEDURE REPORT           PATIENT:  Tonya [name], Tonya [name]  MR#:  [id number]     BIRTHDATE:  1940-10-25, 36 yrs. old  GENDER:  female     ENDOSCOPIST:  Milus Banister, MD     PROCEDURE DATE:  12/18/2009     PROCEDURE:  Colonoscopy with biopsy and snare polypectomy     ASA CLASS:  Class II     INDICATIONS:     Synrchonous early stage right sided cancers, resected 11/2008; No     positive lymph nodes, she did not require adjuvant chemotherapy.     Repeat colonoscopy March 2011.           MEDICATIONS:   Fentanyl 50 mcg IV, Versed 5 mg IV           DESCRIPTION OF PROCEDURE:   After the risks benefits and     alternatives of the procedure were thoroughly explained, informed     consent was obtained.  Digital rectal exam was performed and     revealed no rectal masses.   The LB CF-[id number] endoscope     was introduced through the anus and advanced to the anastamosis,     without limitations.  The quality of the prep was good, using     MoviPrep.  The instrument was then slowly withdrawn as the colon     was fully examined.           <<PROCEDUREIMAGES>>     FINDINGS: There were two small sessile polyps, both removed with     cold snare and sent to pathology (jar 2). These were both 3-38mm     across, one located in transverse colon the other located in     sigmoid colon (see image6).  Mild diverticulosis was found sigmoid     to descending colon segments.  There was a round nodule at the     anastomosis from right hemicolectomy in 2010 (appeared to be on     small bowel side of the anastomosis), measured 1cm. This was     biopsied, did not appear neoplastic however (see image2 and     image3), samples put into path jar  1.  External hemorrhoids were     found.  This was otherwise a examination of the colon (see     image8).   Retroflexed views in the rectum revealed no     abnormalities.    The scope was then withdrawn from the patient     and the procedure completed.           COMPLICATIONS:  None           ENDOSCOPIC IMPRESSION:     1) Two small polyps, both removed and sent to pathology     2) Mild diverticulosis in the sigmoid to descending colon     segments     3) Small, round nodule at anastomosis, biopsied (did not appear     neoplastic however)     4) External hemorrhoids     5) Otherwise normal  examination           RECOMMENDATIONS:     Given personal history of colon cancer, she will likely need     repeat colonoscopy in 3 years (await pathology for final     recommendations).           REPEAT EXAM:  await pathology           ______________________________     Milus Banister, MD           cc:  [name], MD; Ronnald Collum, MD           n.     eSIGNED:   Milus Banister at 12/18/2009 09:01 AM           Tonya [name], [id number]  Note: An exclamation mark (!) indicates a result that was not dispersed into the flowsheet. Document Creation Date: 12/18/2009 9:02 AM _______________________________________________________________________  (1) Order result status: Final Collection or observation date-time: 12/18/2009 08:53 Requested date-time:  Receipt date-time:  Reported date-time:  Referring Physician:   Ordering Physician: Owens Loffler [phone]) Specimen Source:  Source: Tawanna Cooler Order Number: [id number] Lab site:   Appended Document: Colonoscopy recall     Procedures Next Due Date:    Colonoscopy: 11/2012

## 2010-10-28 NOTE — Letter (Signed)
Summary: Diabetic Instructions  [facility]  [place], [place] [address]   Phone: [phone]  Fax: [phone]    TISHANA ERA 10-12-1940 MRN: [id number]   _  _   ORAL DIABETIC MEDICATION INSTRUCTIONS  The day before your procedure:   Take your diabetic pill as you do normally  The day of your procedure:   Do not take your diabetic pill    We will check your blood sugar levels during the admission process and again in Recovery before discharging you home  ________________________________________________________________________

## 2010-10-28 NOTE — Progress Notes (Signed)
Summary: refill request for glucovanse  Phone Note Refill Request Message from:  Fax from Pharmacy  Refills Requested: Medication #1:  GLUCOVANCE 2.5-500 MG TABS Take 1 by mouth two times a day Faxed request from [facility] mail order pharmacy.  Directions in chart are for one two times a day, request is for 2 two times a day.  Fax is on your desk.  Initial call taken by: Marty Heck CMA,  February 28, 2010 4:18 PM  Follow-up for Phone Call        Call pt to verify dose.Marland KitchenMarland KitchenI believe high dose is correct, but please verify and change in EMR. Form completed for higherdose..send in if correct.  Follow-up by: [name] MD,  February 28, 2010 4:32 PM  Additional Follow-up for Phone Call Additional follow up Details #1::        Patient only on one in am and 1 in pm Additional Follow-up by: Zenda Alpers CMA Deborra Medina),  March 03, 2010 8:15 AM    Additional Follow-up for Phone Call Additional follow up Details #2::    changed on sheet and faxed to pharmacy Follow-up by: Zenda Alpers CMA Deborra Medina),  March 03, 2010 3:54 PM

## 2010-10-28 NOTE — Letter (Signed)
Summary: [facility] - Pre-Op Clearance   Imported By: Marilynne Drivers 04/30/2010 14:03:55  _____________________________________________________________________  External Attachment:    Type:   Image     Comment:   External Document

## 2010-10-28 NOTE — Progress Notes (Signed)
Summary: refill request for omeprazole  Phone Note Refill Request Message from:  Fax from Pharmacy  Refills Requested: Medication #1:  OMEPRAZOLE 20 MG CPDR Take 2 tablet by mouth once a day Faxed form from [facility] is on your desk, this is a new script for them.  Initial call taken by: Marty Heck CMA,  July 11, 2010 9:51 AM

## 2010-10-28 NOTE — Progress Notes (Signed)
Summary: refill request for nabumetone  Phone Note Refill Request Message from:  Fax from Pharmacy  Refills Requested: Medication #1:  NABUMETONE 750 MG TABS Take 1 tablet by mouth two times a day Faxed form from [facility] is on your desk.  Initial call taken by: Marty Heck CMA,  April 17, 2010 4:13 PM

## 2010-10-28 NOTE — Letter (Signed)
Summary: Results Letter  [facility]  [place], [place] [address]   Phone: [phone]  Fax: [phone]        December 20, 2009 MRN: [id number]    Tonya [name] [address] [place], [place]  [address]    Dear [name],   Good news.  The polyp(s) that were removed during your recent procedure were NOT pre-cancerous.  Given your personal history of colon cancer you will need a repeat colonoscopy in 3 years.  We will put your information in our reminder system and contact you around that time.      Sincerely,  Milus Banister MD  This letter has been electronically signed by your physician.  Appended Document: Results Letter Letter mailed 3.25.11

## 2010-10-28 NOTE — Letter (Signed)
Summary: Results Follow up Letter  [facility]  [address] [place], [place] [address]   Phone: [phone]  Fax: [phone]    10/31/2009 MRN: [id number]  [name] [place], [name]  Dear Ms. Tonya [name],  The following are the results of your recent test(s):  Test         Result    Pap Smear:        Normal __X___  Not Normal _____ Comments: Please repeat in one year. ______________________________________________________ Cholesterol: LDL(Bad cholesterol):         Your goal is less than:         HDL (Good cholesterol):       Your goal is more than: Comments:  ______________________________________________________ Mammogram:        Normal _____  Not Normal _____ Comments:  ___________________________________________________________________ Hemoccult:        Normal _____  Not normal _______ Comments:    _____________________________________________________________________ Other Tests:    We routinely do not discuss normal results over the telephone.  If you desire a copy of the results, or you have any questions about this information we can discuss them at your next office visit.   Sincerely,        [name], MD

## 2010-10-28 NOTE — Letter (Signed)
Summary: [facility]  [facility]   Imported By: Phillis Knack 10/18/2009 09:33:55  _____________________________________________________________________  External Attachment:    Type:   Image     Comment:   External Document

## 2010-10-28 NOTE — Progress Notes (Signed)
Summary: Rx Nabumetone ([place])  Phone Note Refill Request Call back at [phone] Message from:  [facility] on October 30, 2009 2:35 PM  Refills Requested: Medication #1:  NABUMETONE 750 MG TABS Take 1 tablet by mouth two times a day Received faxed refill request, please advise.  Form in your IN box   Method Requested: Fax to [place] Initial call taken by: Sherrian Divers CMA Deborra Medina),  October 30, 2009 2:36 PM

## 2010-10-28 NOTE — Medication Information (Signed)
Summary: Med Issue Order/[facility]  Med Issue Order/[facility]   Imported By: Edmonia James 06/16/2010 13:02:52  _____________________________________________________________________  External Attachment:    Type:   Image     Comment:   External Document

## 2010-10-28 NOTE — Assessment & Plan Note (Signed)
Summary: RASH ON ABDOMEN/ 2:15   Vital Signs:  Patient profile:   70 year old female Height:      66.25 inches Weight:      179.6 pounds BMI:     28.87 Temp:     99.0 degrees F oral Pulse rate:   72 / minute Pulse rhythm:   regular BP sitting:   140 / 70  (left arm) Cuff size:   regular  Vitals Entered By: Zenda Alpers CMA Deborra Medina) (April 24, 2010 1:58 PM)   History of Present Illness: Chief complaint rash on abdomen  Elderly lady with abdominal and pelvic rash that has been present for about a month  some itching  no new exposures  has TKA on tues done by Dr. Durward Fortes  ROS: GEN: No acute illnesses, no fevers, chills, sweats, fatigue, weight loss, or URI sx. GI: No n/v/d Pulm: No SOB, cough, wheezing Interactive and getting along well at home.  Otherwise, ROS is as per the HPI.   GEN: Well-developed,well-nourished,in no acute distress; alert,appropriate and cooperative throughout examination HEENT: Normocephalic and atraumatic without obvious abnormalities. No apparent alopecia or balding. Ears, externally no deformities PULM: Breathing comfortably in no respiratory distress EXT: No clubbing, cyanosis, or edema PSYCH: Normally interactive. Cooperative during the interview. Pleasant. Friendly and conversant. Not anxious or depressed appearing. Normal, full affect.   flat rash, reddish with linear edge under panus, groin folds.  Allergies: 1)  ! Augmentin   Impression & Recommendations:  Problem # 1:  CANDIDIASIS OF SKIN AND NAILS (ICD-112.3)  simple candida  nystatin diflucan since tues with surgery  patient has asked that i tell Dr. Durward Fortes about rash  Orders: Prescription Created Electronically [phone])  Complete Medication List: 1)  Glucovance 2.5-500 Mg Tabs (Glyburide-metformin) .... Take 1 by mouth two times a day 2)  Lipitor 80 Mg Tabs (Atorvastatin calcium) .... Take 1 tab by mouth daily 3)  Cartia Xt 240 Mg Cp24 (Diltiazem hcl coated beads)  .... Take 1 tab by mouth daily 4)  Omeprazole 20 Mg Cpdr (Omeprazole) .... Take 2 tablet by mouth once a day 5)  Klor-con M20 20 Meq Tbcr (Potassium chloride crys cr) .... Take 1 tab by mouth two times a day 6)  Oscal 500/200 D-3 500-200 Mg-unit Tabs (Calcium-vitamin d) .... Take 1 tablet by mouth two times a day 7)  Fish Oil Oil (Fish oil) .... Take 1 capsule by mouth two times a day 8)  Multivitamins Tabs (Multiple vitamin) .Marland Kitchen.. 1 by mouth once daily 9)  Coreg 12.5 Mg Tabs (Carvedilol) .... Take 1 tablet by mouth two times a day 10)  Celexa 40 Mg Tabs (Citalopram hydrobromide) .... Take 1 tab by mouth every day 11)  Lisinopril-hydrochlorothiazide 20-12.5 Mg Tabs (Lisinopril-hydrochlorothiazide) .... Take 2 tablet by mouth once a day 12)  Ferrous Sulfate 325 (65 Fe) Mg Tabs (Ferrous sulfate) .Marland Kitchen.. 1 tab by mouth two times a day 13)  Nabumetone 750 Mg Tabs (Nabumetone) .... Take 1 tablet by mouth two times a day 14)  Nitroglycerin 0.3 Mg Subl (Nitroglycerin) .... Use as directed 15)  Cholestyramine Light 4 Gm/dose Powd (Cholestyramine light) .... Take 1 packet twice a day 16)  Fluconazole 200 Mg Tabs (Fluconazole) .... 2 tabs by mouth x 1 17)  Nystatin 100000 Unit/gm Crea (Nystatin) .... Apply three times a day as directed until rash completely gone and for 5 days after this Prescriptions: NYSTATIN 100000 UNIT/GM CREA (NYSTATIN) Apply three times a day as directed until rash completely  gone and for 5 days after this  #1 tube x 1   Entered and Authorized by:   Owens Loffler MD   Signed by:   Owens Loffler MD on 04/24/2010   Method used:   Electronically to        [facility] (retail)       [place], [place]  [address]       Ph: [phone]       Fax: [phone]   RxID:   [id number] FLUCONAZOLE 200 MG TABS (FLUCONAZOLE) 2 tabs by mouth x 1  #2 x 0   Entered and Authorized by:   Owens Loffler MD   Signed by:    Owens Loffler MD on 04/24/2010   Method used:   Electronically to        [facility] ([facility]       [place], [place]  [address]       Ph: [phone]       Fax: [phone]   RxID:   [id number]   Current Allergies (reviewed today): ! AUGMENTIN  Appended Document: RASH ON ABDOMEN/ 2:15 Heather - please call [facility] in [place] and have them leave a note for Dr. Durward Fortes that the patient has a small amount of candida under panus and groin folds. The patient asked that we call and let him know since she has TKR on tues.  Appended Document: RASH ON ABDOMEN/ 2:15 faxed to dr [facility]fields office.[place]

## 2010-10-28 NOTE — Progress Notes (Signed)
  Walk in Patient Form Recieved " Needs Surgical Clearance Form Completed" No Form attached to this paper. Tonya [name] has clearance form per [name], this is just a reminder..sent to [facility]  April 01, 2010 8:34 AM

## 2010-10-28 NOTE — Miscellaneous (Signed)
Summary: REC COL/ROUTINE...AS.  Clinical Lists Changes  Medications: Added new medication of MOVIPREP 100 GM  SOLR (PEG-KCL-NACL-NASULF-NA ASC-C) As directed - Signed Rx of MOVIPREP 100 GM  SOLR (PEG-KCL-NACL-NASULF-NA ASC-C) As directed;  #1 x 0;  Signed;  Entered by: Ernestine Conrad RN;  Authorized by: Milus Banister MD;  Method used: Electronically to [facility] [address], [place], [place], [place]  [address], Ph: [phone], Fax: [phone] Observations: Added new observation of ALLERGY REV: Done (12/09/2009 9:33)    Prescriptions: MOVIPREP 100 GM  SOLR (PEG-KCL-NACL-NASULF-NA ASC-C) As directed  #1 x 0   Entered by:   Ernestine Conrad RN   Authorized by:   Milus Banister MD   Signed by:   Ernestine Conrad RN on 12/09/2009   Method used:   Electronically to        [facility]       [address]       [place], [place]  [address]       Ph: [phone]       Fax: [phone]   RxID:   [id number]

## 2010-10-28 NOTE — Letter (Signed)
Summary: Surgical Clearance/[facility]   Imported By: Edmonia James 04/04/2010 11:12:04  _____________________________________________________________________  External Attachment:    Type:   Image     Comment:   External Document

## 2010-10-28 NOTE — Assessment & Plan Note (Signed)
Summary: CPX / LFW   Vital Signs:  Patient profile:   70 year old female Height:      66.25 inches Weight:      173.4 pounds BMI:     27.88 Temp:     98.1 degrees F oral Pulse rate:   68 / minute Pulse rhythm:   regular BP sitting:   140 / 70  (left arm) Cuff size:   regular  Vitals Entered By: [place] Deborra Medina) (October 29, 2009 10:57 AM)  History of Present Illness: Chief complaint follow up multiple medical issues  Under more stress lately..recent move.  Not exercising, but plans on restarting.   Hypertension History:      She complains of headache, but denies chest pain, peripheral edema, and side effects from treatment.  Not checking recently.        Positive major cardiovascular risk factors include female age 72 years old or older, diabetes, hyperlipidemia, and hypertension.  Negative major cardiovascular risk factors include non-tobacco-user status.        Positive history for target organ damage include ASHD (either angina/prior MI/prior CABG).     Problems Prior to Update: 1)  Herpes Zoster  (ICD-053.9) 2)  Breast Mass, Left  (ICD-611.72) 3)  Dyspnea  (ICD-786.05) 4)  Flushing  (ICD-782.62) 5)  Atrial Fibrillation  (ICD-427.31) 6)  Peripheral Edema  (ICD-782.3) 7)  Coronary Artery Disease  (ICD-414.00) 8)  Breast Pain, Left  (ICD-611.71) 9)  Hyperlipidemia  (ICD-272.4) 10)  Hypertension  (ICD-401.9) 11)  Sinusitis - Acute-nos  (ICD-461.9) 12)  Allergic Rhinitis  (ICD-477.9) 13)  Hemoccult Positive Stool  (ICD-578.1) 14)  Adenocarcinoma, Colon, Cecum  (ICD-153.4) 15)  Anemia  (ICD-285.9) 16)  Sciatica  (ICD-724.3) 17)  Cystitis, Acute  (ICD-595.0) 18)  Diarrhea, Chronic  (ICD-787.91) 19)  Nausea  (ICD-787.02) 20)  Gerd  (ICD-530.81) 21)  Depression  (ICD-311) 22)  Anxiety  (ICD-300.00) 23)  Osteoarthritis  (ICD-715.90) 24)  Ibs  (ICD-564.1) 25)  Degenerative Joint Disease  (ICD-715.90) 26)  Syndrome, Carpal Tunnel  (ICD-354.0) 27)  Diabetes  Mellitus, Type II  (ICD-250.00) 28)  Family History Diabetes 1st Degree Relative  (ICD-V18.0)  Current Medications (verified): 1)  Glucovance 2.5-500 Mg Tabs (Glyburide-Metformin) .... Take 1 By Mouth Two Times A Day 2)  Lipitor 80 Mg Tabs (Atorvastatin Calcium) .... Take 1 Tab By Mouth Daily 3)  Cartia Xt 240 Mg Cp24 (Diltiazem Hcl Coated Beads) .... Take 1 Tab By Mouth Daily 4)  Omeprazole 20 Mg Cpdr (Omeprazole) .... Take 2 Tablet By Mouth Once A Day 5)  Klor-Con M20 20 Meq  Tbcr (Potassium Chloride Crys Cr) .... Take 1 Tab By Mouth Two Times A Day 6)  Oscal 500/200 D-3 500-200 Mg-Unit  Tabs (Calcium-Vitamin D) .... Take 1 Tablet By Mouth Two Times A Day 7)  Fish Oil   Oil (Fish Oil) .... Take 1 Capsule By Mouth Two Times A Day 8)  Multivitamins   Tabs (Multiple Vitamin) .Marland Kitchen.. 1 By Mouth Once Daily 9)  Coreg 12.5 Mg  Tabs (Carvedilol) .... Take 1 Tablet By Mouth Two Times A Day 10)  Celexa 40 Mg Tabs (Citalopram Hydrobromide) .... Take 1 Tab By Mouth Every Day 11)  Lisinopril-Hydrochlorothiazide 20-12.5 Mg Tabs (Lisinopril-Hydrochlorothiazide) .... Take 2 Tablet By Mouth Once A Day 12)  Ferrous Sulfate 325 (65 Fe) Mg Tabs (Ferrous Sulfate) .Marland Kitchen.. 1 Tab By Mouth Two Times A Day 13)  Nabumetone 750 Mg Tabs (Nabumetone) .... Take 1 Tablet By  Mouth Two Times A Day 14)  Nitroglycerin 0.3 Mg Subl (Nitroglycerin) .... Use As Directed 15)  Cholestyramine Light 4 Gm/dose  Powd (Cholestyramine Light) .... Take 1 Packet Twice A Day  Allergies: 1)  ! Augmentin  Past History:  Past medical, surgical, family and social histories (including risk factors) reviewed, and no changes noted (except as noted below).  Past Medical History: Reviewed history from 09/03/2009 and no changes required. 1. ATRIAL FIBRILLATION  2. PERIPHERAL EDEMA   3. CORONARY ARTERY DISEASE 4. BREAST PAIN, LEFT 5. HYPERLIPIDEMIA   6. HYPERTENSION   7. SINUSITIS - ACUTE-NOS  8. ALLERGIC RHINITIS   9. HEMOCCULT POSITIVE STOOL    10. ADENOCARCINOMA, COLON, CECUM   11. ANEMIA   12. SCIATICA  13. CYSTITIS, ACUTE  14. DIARRHEA, CHRONIC   15. NAUSEA  16. GERD  17. DEPRESSION  18. ANXIETY  19. OSTEOARTHRITIS   20. IBS   21. DEGENERATIVE JOINT DISEASE  22. SYNDROME, CARPAL TUNNEL  23. DIABETES MELLITUS, TYPE II  24. FAMILY HISTORY DIABETES 1ST DEGREE RELATIVE    Past Surgical History: Reviewed history from 12/18/2008 and no changes required. Arthroscopy both knees EGD 10-20-04, gastritis colonoscopy 10-16-04 per Dr. Carlean Purl, repeat in 5 yrs Cholecystectomy 12/2005 stent, Dr Zoila Shutter 11/2008 partial right colectomy for adenocarcinoma  Family History: Reviewed history from 11/23/2007 and no changes required. Family History Diabetes 1st degree relative Family History Hypertension mother; massive MI age 81, prostate cancer father, massive MI age 30  Social History: Reviewed history from 11/23/2007 and no changes required. Retired Single, divorced Never Smoked Alcohol use-no Drug use-no Regular exercise-no, limited due to knees Diet: addicted to sweets, some fruit and veggies, water daily  Review of Systems General:  Complains of fever. CV:  Denies chest pain or discomfort. Resp:  Denies shortness of breath. GI:  Denies abdominal pain, bloody stools, constipation, and diarrhea. GU:  Denies discharge and dysuria. MS:  Denies joint pain. Derm:  shingles right forehead rash resolved.Marland Kitchenocc right forehead ache, minor. Psych:  Denies anxiety, depression, and suicidal thoughts/plans.  Physical Exam  General:  Well-developed,well-nourished,in no acute distress; alert,appropriate and cooperative throughout examination Eyes:  PERRLA, EOMI Ears:  External ear exam shows no significant lesions or deformities.  Otoscopic examination reveals clear canals, tympanic membranes are intact bilaterally without bulging, retraction, inflammation or discharge. Hearing is grossly normal bilaterally. Nose:  External nasal  examination shows no deformity or inflammation. Nasal mucosa are pink and moist without lesions or exudates. Mouth:  MMM Neck:  no carotid bruit or thyromegaly,  no cervical or supraclavicular lymphadenopathy  Chest Wall:  No deformities, masses, or tenderness noted. Breasts:  No mass, nodules, thickening, tenderness, bulging, retraction, inflamation, nipple discharge or skin changes noted.   Lungs:  Normal respiratory effort, chest expands symmetrically. Lungs are clear to auscultation, no crackles or wheezes. Heart:  Normal rate and regular rhythm. S1 and S2 normal without gallop, murmur, click, rub or other extra sounds. Abdomen:  Bowel sounds positive,abdomen soft and non-tender without masses, organomegaly or hernias noted. Genitalia:  Pelvic Exam:        External: normal female genitalia without lesions or masses        Vagina: normal without lesions or masses, cystocele and rectocele.Marland Kitchenasymptomatic per pt        Cervix: normal without lesions or masses        Adnexa: normal bimanual exam without masses or fullness        Uterus: normal by palpation  Pap smear: performed Pulses:  R and L posterior tibial pulses are full and equal bilaterally  Extremities:  no edema   Diabetes Management Exam:    Foot Exam (with socks and/or shoes not present):       Sensory-Pinprick/Light touch:          Left medial foot (L-4): normal          Left dorsal foot (L-5): normal          Left lateral foot (S-1): normal          Right medial foot (L-4): normal          Right dorsal foot (L-5): normal          Right lateral foot (S-1): normal       Sensory-Monofilament:          Left foot: normal          Right foot: normal       Inspection:          Left foot: normal          Right foot: normal       Nails:          Left foot: normal          Right foot: normal   Impression & Recommendations:  Problem # 1:  HYPERLIPIDEMIA (ICD-272.4) Inadequate control of triglycerides..start exercsie,  increase fish oil. She is not open to additional prescription meds. LDL at goal<100 on lipitor 80.  Her updated medication list for this problem includes:    Lipitor 80 Mg Tabs (Atorvastatin calcium) .Marland Kitchen... Take 1 tab by mouth daily    Cholestyramine Light 4 Gm/dose Powd (Cholestyramine light) .Marland Kitchen... Take 1 packet twice a day  Problem # 2:  HYPERTENSION (ICD-401.9) Well controlled. Continue current medication.  Her updated medication list for this problem includes:    Cartia Xt 240 Mg Cp24 (Diltiazem hcl coated beads) .Marland Kitchen... Take 1 tab by mouth daily    Coreg 12.5 Mg Tabs (Carvedilol) .Marland Kitchen... Take 1 tablet by mouth two times a day    Lisinopril-hydrochlorothiazide 20-12.5 Mg Tabs (Lisinopril-hydrochlorothiazide) .Marland Kitchen... Take 2 tablet by mouth once a day  Problem # 3:  HERPES ZOSTER (ICD-053.9) Improving.   Problem # 4:  DIABETES MELLITUS, TYPE II (ICD-250.00) Well controlled on current meds.  Her updated medication list for this problem includes:    Glucovance 2.5-500 Mg Tabs (Glyburide-metformin) .Marland Kitchen... Take 1 by mouth two times a day    Lisinopril-hydrochlorothiazide 20-12.5 Mg Tabs (Lisinopril-hydrochlorothiazide) .Marland Kitchen... Take 2 tablet by mouth once a day  Problem # 5:  DEPRESSION (ICD-311) Well controlled on current meds.  Her updated medication list for this problem includes:    Celexa 40 Mg Tabs (Citalopram hydrobromide) .Marland Kitchen... Take 1 tab by mouth every day  Problem # 6:  Gynecological examination-routine (ICD-V72.31) PAP pending. Encouraged exercise, weight loss, healthy eating habits.  Reviewed preventive care protocols, scheduled due services, and updated immunizations.   Complete Medication List: 1)  Glucovance 2.5-500 Mg Tabs (Glyburide-metformin) .... Take 1 by mouth two times a day 2)  Lipitor 80 Mg Tabs (Atorvastatin calcium) .... Take 1 tab by mouth daily 3)  Cartia Xt 240 Mg Cp24 (Diltiazem hcl coated beads) .... Take 1 tab by mouth daily 4)  Omeprazole 20 Mg Cpdr (Omeprazole)  .... Take 2 tablet by mouth once a day 5)  Klor-con M20 20 Meq Tbcr (Potassium chloride crys cr) .... Take 1 tab by mouth two times a day  6)  Oscal 500/200 D-3 500-200 Mg-unit Tabs (Calcium-vitamin d) .... Take 1 tablet by mouth two times a day 7)  Fish Oil Oil (Fish oil) .... Take 1 capsule by mouth two times a day 8)  Multivitamins Tabs (Multiple vitamin) .Marland Kitchen.. 1 by mouth once daily 9)  Coreg 12.5 Mg Tabs (Carvedilol) .... Take 1 tablet by mouth two times a day 10)  Celexa 40 Mg Tabs (Citalopram hydrobromide) .... Take 1 tab by mouth every day 11)  Lisinopril-hydrochlorothiazide 20-12.5 Mg Tabs (Lisinopril-hydrochlorothiazide) .... Take 2 tablet by mouth once a day 12)  Ferrous Sulfate 325 (65 Fe) Mg Tabs (Ferrous sulfate) .Marland Kitchen.. 1 tab by mouth two times a day 13)  Nabumetone 750 Mg Tabs (Nabumetone) .... Take 1 tablet by mouth two times a day 14)  Nitroglycerin 0.3 Mg Subl (Nitroglycerin) .... Use as directed 15)  Cholestyramine Light 4 Gm/dose Powd (Cholestyramine light) .... Take 1 packet twice a day  Other Orders: Pelvic & Breast Exam ( Medicare)  (G0101)  Hypertension Assessment/Plan:      The patient's hypertensive risk group is category C: Target organ damage and/or diabetes.  Today's blood pressure is 140/70.  Her blood pressure goal is < 130/80.  Patient Instructions: 1)  Follow BP at home, call if greater than 130/80 regularly.  2)  Restart exercise, low fat diet..continue to improve lifestyle.  3)  May increase fish oil to 3 tabs daily 4)  Recheck fasting lipids, AST, ALT  in 3 months Dx 272.0    Prescriptions: CARTIA XT 240 MG CP24 (DILTIAZEM HCL COATED BEADS) Take 1 tab by mouth daily  #30 Each x 11   Entered and Authorized by:   [name] MD   Signed by:   [name] MD on 10/29/2009   Method used:   Print then Give to Patient   RxID:   [id number] LIPITOR 80 MG TABS (ATORVASTATIN CALCIUM) Take 1 tab by mouth daily  #30 Each x 11   Entered and Authorized by:   [name] MD   Signed by:   [name] MD on 10/29/2009   Method used:   Print then Give to Patient   [id number]:[id number]   Current Allergies (reviewed today): ! AUGMENTIN  Last Pneumovax:  given (09/29/2003 10:29:25 AM) Pneumovax Next Due:  Not Indicated no past abnormal pap smears.     Past Medical History:    Reviewed history from 09/03/2009 and no changes required:       1. ATRIAL FIBRILLATION        2. PERIPHERAL EDEMA         3. CORONARY ARTERY DISEASE       4. BREAST PAIN, LEFT       5. HYPERLIPIDEMIA         6. HYPERTENSION         7. SINUSITIS - ACUTE-NOS        8. ALLERGIC RHINITIS         9. HEMOCCULT POSITIVE STOOL         10. ADENOCARCINOMA, COLON, CECUM         11. ANEMIA         12. SCIATICA        13. CYSTITIS, ACUTE        14. DIARRHEA, CHRONIC         15. NAUSEA        16. GERD        17. DEPRESSION  18. ANXIETY        19. OSTEOARTHRITIS         20. IBS         21. DEGENERATIVE JOINT DISEASE        22. SYNDROME, CARPAL TUNNEL        23. DIABETES MELLITUS, TYPE II        24. FAMILY HISTORY DIABETES 1ST DEGREE RELATIVE    Past Surgical History:    Reviewed history from 12/18/2008 and no changes required:       Arthroscopy both knees       EGD 10-20-04, gastritis       colonoscopy 10-16-04 per Dr. Carlean Purl, repeat in 5 yrs       Cholecystectomy       12/2005 stent, Dr Zoila Shutter       11/2008 partial right colectomy for adenocarcinoma

## 2010-10-28 NOTE — Letter (Signed)
Summary: [facility] Instructions  [facility]  [place], [place] [address]   Phone: [phone]  Fax: [phone]       Tonya [name]    12/20/1940    MRN: [id number]        Procedure Day Sudie Grumbling:  Wednesday 12/18/2009     Arrival Time: 7:30 am      Procedure Time: 8:30 am     Location of Procedure:                    _x _  [place] (4th Floor)                        [place]   Starting 5 days prior to your procedure Friday 3/18  do not eat nuts, seeds, popcorn, corn, beans, peas,  salads, or any raw vegetables.  Do not take any fiber supplements (e.g. Metamucil, Citrucel, and Benefiber).  THE DAY BEFORE YOUR PROCEDURE         DATE: Tuesday 3/22  1.  Drink clear liquids the entire day-NO SOLID FOOD  2.  Do not drink anything colored red or purple.  Avoid juices with pulp.  No orange juice.  3.  Drink at least 64 oz. (8 glasses) of fluid/clear liquids during the day to prevent dehydration and help the prep work efficiently.  CLEAR LIQUIDS INCLUDE: Water Jello Ice Popsicles Tea (sugar ok, no milk/cream) Powdered fruit flavored drinks Coffee (sugar ok, no milk/cream) Gatorade Juice: apple, white grape, white cranberry  Lemonade Clear bullion, consomm, broth Carbonated beverages (any kind) Strained chicken noodle soup Hard Candy                             4.  In the morning, mix first dose of MoviPrep solution:    Empty 1 Pouch A and 1 Pouch B into the disposable container    Add lukewarm drinking water to the top line of the container. Mix to dissolve    Refrigerate (mixed solution should be used within 24 hrs)  5.  Begin drinking the prep at 5:00 p.m. The MoviPrep container is divided by 4 marks.   Every 15 minutes drink the solution down to the next mark (approximately 8 oz) until the full liter is complete.   6.  Follow completed prep with 16 oz of clear liquid of your choice  (Nothing red or purple).  Continue to drink clear liquids until bedtime.  7.  Before going to bed, mix second dose of MoviPrep solution:    Empty 1 Pouch A and 1 Pouch B into the disposable container    Add lukewarm drinking water to the top line of the container. Mix to dissolve    Refrigerate  THE DAY OF YOUR PROCEDURE      DATE: Wednesday 3/23  Beginning at 3:30 am  (5 hours before procedure):         1. Every 15 minutes, drink the solution down to the next mark (approx 8 oz) until the full liter is complete.  2. Follow completed prep with 16 oz. of clear liquid of your choice.    3. You may drink clear liquids until 6:30 am  (2 HOURS BEFORE PROCEDURE).   MEDICATION INSTRUCTIONS  Unless otherwise instructed, you should take regular prescription medications with a small sip of water   as early as possible  the morning of your procedure.  Diabetic patients - see separate instructions.          OTHER INSTRUCTIONS  You will need a responsible adult at least 71 years of age to accompany you and drive you home.   This person must remain in the waiting room during your procedure.  Wear loose fitting clothing that is easily removed.  Leave jewelry and other valuables at home.  However, you may wish to bring a book to read or  an iPod/MP3 player to listen to music as you wait for your procedure to start.  Remove all body piercing jewelry and leave at home.  Total time from sign-in until discharge is approximately 2-3 hours.  You should go home directly after your procedure and rest.  You can resume normal activities the  day after your procedure.  The day of your procedure you should not:   Drive   Make legal decisions   Operate machinery   Drink alcohol   Return to work  You will receive specific instructions about eating, activities and medications before you leave.    The above instructions have been reviewed and explained to me by   Ernestine Conrad,  RN______________________    I fully understand and can verbalize these instructions _____________________________ Date _________

## 2010-10-28 NOTE — Letter (Signed)
Summary: [facility]  [facility]   Imported By: Edmonia James 11/19/2009 08:11:02  _____________________________________________________________________  External Attachment:    Type:   Image     Comment:   External Document  Appended Document: Orders Update    Clinical Lists Changes  Observations: Added new observation of EYES COMMENT: 11/2010 (11/19/2009 8:13) Added new observation of DMEYEEXMRES: no DM retinopathy (11/13/2009 8:14) Added new observation of DIAB EYE EX: no DM retinopathy (11/13/2009 8:14)         Diabetes Management History:      The patient is a 69 years old female who comes in for evaluation of DM Type 2.  She is (or has been) enrolled in the "Diabetic Education Program".  She is checking home blood sugars.  She says that she is exercising.  Type of exercise includes: walking.  She is doing this 2 times per week.    Diabetes Management Exam:    Eye Exam:       Eye Exam done elsewhere          Date: 11/13/2009          Results: no DM retinopathy          Done by: Katy Fitch  Diabetes Management Assessment/Plan:      The following lipid goals have been established for the patient: Total cholesterol goal of 200; LDL cholesterol goal of 70; HDL cholesterol goal of 40; Triglyceride goal of 150.  Her blood pressure goal is < 130/80.

## 2010-10-28 NOTE — Progress Notes (Signed)
Summary: coreg  Phone Note Call from Patient Call back at Home Phone [phone]   Caller: Patient Call For: [name] MD Summary of Call: Patient is out of her coreg and it waiting on it through her mail order pharmacy. She is asking if she can get a 10 day supply until it comes in called in to [facility]. Please advise.  Initial call taken by: Lacretia Nicks,  Feb 21, 2010 10:59 AM  Follow-up for Phone Call        Yes.Marland Kitchengo ahead and fill. Follow-up by: [name] MD,  Feb 21, 2010 1:40 PM    Prescriptions: COREG 12.5 MG  TABS (CARVEDILOL) Take 1 tablet by mouth two times a day  #60 x 0   Entered by:   Zenda Alpers CMA ([place])   Authorized by:   [name] MD   Signed by:   Zenda Alpers CMA ([place]) on 02/21/2010   Method used:   [facility]ronically to        [facility]       [place], [place]  [address]       Ph: [phone]       Fax: [phone]   RxID:   [id number] COREG 12.5 MG  TABS (CARVEDILOL) Take 1 tablet by mouth two times a day  #60 x 0   Entered by:   Zenda Alpers CMA ([place])   Authorized by:   [name] MD   Signed by:   Zenda Alpers CMA ([place]) on 02/21/2010   Method used:   Electronically to        [facility] (retail)       [phone]       [place], [place]  [address]       Ph: [phone]       Fax: [phone]   RxID:   [id number]

## 2010-10-28 NOTE — Assessment & Plan Note (Signed)
Summary: 10 DAY F/U DLO   Vital Signs:  Patient profile:   70 year old female Height:      66.25 inches Weight:      175.0 pounds BMI:     28.13 Temp:     98.7 degrees F oral Pulse rate:   68 / minute Pulse rhythm:   regular BP sitting:   110 / 70  (left arm) Cuff size:   regular  Vitals Entered By: Zenda Alpers CMA Deborra Medina) (October 11, 2009 12:23 PM)  History of Present Illness: Chief complaint 10 day follow up  Shingles, 10 days out. Completed antiviral course.  Used tramadol...minimal pain now..has not taken in last 24 hours. Mild PND and cough.   Saw eye MD..no issues in eye with shingles...has gfollow up i 4 weeks.   Problems Prior to Update: 1)  Herpes Zoster  (ICD-053.9) 2)  Breast Mass, Left  (ICD-611.72) 3)  Dyspnea  (ICD-786.05) 4)  Flushing  (ICD-782.62) 5)  Atrial Fibrillation  (ICD-427.31) 6)  Peripheral Edema  (ICD-782.3) 7)  Coronary Artery Disease  (ICD-414.00) 8)  Breast Pain, Left  (ICD-611.71) 9)  Hyperlipidemia  (ICD-272.4) 10)  Hypertension  (ICD-401.9) 11)  Sinusitis - Acute-nos  (ICD-461.9) 12)  Allergic Rhinitis  (ICD-477.9) 13)  Hemoccult Positive Stool  (ICD-578.1) 14)  Adenocarcinoma, Colon, Cecum  (ICD-153.4) 15)  Anemia  (ICD-285.9) 16)  Sciatica  (ICD-724.3) 17)  Cystitis, Acute  (ICD-595.0) 18)  Diarrhea, Chronic  (ICD-787.91) 19)  Nausea  (ICD-787.02) 20)  Gerd  (ICD-530.81) 21)  Depression  (ICD-311) 22)  Anxiety  (ICD-300.00) 23)  Osteoarthritis  (ICD-715.90) 24)  Ibs  (ICD-564.1) 25)  Degenerative Joint Disease  (ICD-715.90) 26)  Syndrome, Carpal Tunnel  (ICD-354.0) 27)  Diabetes Mellitus, Type II  (ICD-250.00) 28)  Family History Diabetes 1st Degree Relative  (ICD-V18.0)  Current Medications (verified): 1)  Glucovance 2.5-500 Mg Tabs (Glyburide-Metformin) .... Take 1 By Mouth Two Times A Day 2)  Lipitor 80 Mg Tabs (Atorvastatin Calcium) .... Take 1 Tab By Mouth Daily 3)  Cartia Xt 240 Mg Cp24 (Diltiazem Hcl Coated Beads)  .... Take 1 Tab By Mouth Daily 4)  Omeprazole 20 Mg Cpdr (Omeprazole) .... Take 2 Tablet By Mouth Once A Day 5)  Klor-Con M20 20 Meq  Tbcr (Potassium Chloride Crys Cr) .... Take 1 Tab By Mouth Two Times A Day 6)  Oscal 500/200 D-3 500-200 Mg-Unit  Tabs (Calcium-Vitamin D) .... Take 1 Tablet By Mouth Two Times A Day 7)  Fish Oil   Oil (Fish Oil) .... Take 1 Capsule By Mouth Two Times A Day 8)  Multivitamins   Tabs (Multiple Vitamin) .Marland Kitchen.. 1 By Mouth Once Daily 9)  Coreg 12.5 Mg  Tabs (Carvedilol) .... Take 1 Tablet By Mouth Two Times A Day 10)  Celexa 40 Mg Tabs (Citalopram Hydrobromide) .... Take 1 Tab By Mouth Every Day 11)  Lisinopril-Hydrochlorothiazide 20-12.5 Mg Tabs (Lisinopril-Hydrochlorothiazide) .... Take 2 Tablet By Mouth Once A Day 12)  Ferrous Sulfate 325 (65 Fe) Mg Tabs (Ferrous Sulfate) .Marland Kitchen.. 1 Tab By Mouth Two Times A Day 13)  Nabumetone 750 Mg Tabs (Nabumetone) .... Take 1 Tablet By Mouth Two Times A Day 14)  Nitroglycerin 0.3 Mg Subl (Nitroglycerin) .... Use As Directed 15)  Cholestyramine Light 4 Gm/dose  Powd (Cholestyramine Light) .... Take 1 Packet Twice A Day 16)  Valacyclovir Hcl 1 Gm Tabs (Valacyclovir Hcl) .Marland Kitchen.. 1 Tab By Mouth Three Times A Day X 7 Days 17)  Tramadol Hcl  50 Mg Tabs (Tramadol Hcl) .Marland Kitchen.. 1 Tab By Mouth Three Times A Day As Needed Pain, Call If Pain Not Controlled  Allergies: 1)  ! Augmentin (Amoxicillin-Pot Clavulanate)  Past History:  Past medical, surgical, family and social histories (including risk factors) reviewed, and no changes noted (except as noted below).  Past Medical History: Reviewed history from 09/03/2009 and no changes required. 1. ATRIAL FIBRILLATION  2. PERIPHERAL EDEMA   3. CORONARY ARTERY DISEASE 4. BREAST PAIN, LEFT 5. HYPERLIPIDEMIA   6. HYPERTENSION   7. SINUSITIS - ACUTE-NOS  8. ALLERGIC RHINITIS   9. HEMOCCULT POSITIVE STOOL   10. ADENOCARCINOMA, COLON, CECUM   11. ANEMIA   12. SCIATICA  13. CYSTITIS, ACUTE  14.  DIARRHEA, CHRONIC   15. NAUSEA  16. GERD  17. DEPRESSION  18. ANXIETY  19. OSTEOARTHRITIS   20. IBS   21. DEGENERATIVE JOINT DISEASE  22. SYNDROME, CARPAL TUNNEL  23. DIABETES MELLITUS, TYPE II  24. FAMILY HISTORY DIABETES 1ST DEGREE RELATIVE    Past Surgical History: Reviewed history from 12/18/2008 and no changes required. Arthroscopy both knees EGD 10-20-04, gastritis colonoscopy 10-16-04 per Dr. Carlean Purl, repeat in 5 yrs Cholecystectomy 12/2005 stent, Dr Zoila Shutter 11/2008 partial right colectomy for adenocarcinoma  Family History: Reviewed history from 11/23/2007 and no changes required. Family History Diabetes 1st degree relative Family History Hypertension mother; massive MI age 70, prostate cancer father, massive MI age 69  Social History: Reviewed history from 11/23/2007 and no changes required. Retired Single, divorced Never Smoked Alcohol use-no Drug use-no Regular exercise-no, limited due to knees Diet: addicted to sweets, some fruit and veggies, water daily  Review of Systems General:  Denies fatigue. CV:  Denies chest pain or discomfort.  Physical Exam  General:  Well-developed,well-nourished,in no acute distress; alert,appropriate and cooperative throughout examination Mouth:  MMM Skin:  resolving scabbed  lesions, no erythema,    Impression & Recommendations:  Problem # 1:  HERPES ZOSTER (ICD-053.9) Resolving, no bacterial superinfection. Pain controlled..use tylenol as needed, tramadol for breakthru.  Keep follow up with eye MD.  Cannot afford shingles vaccine.   Complete Medication List: 1)  Glucovance 2.5-500 Mg Tabs (Glyburide-metformin) .... Take 1 by mouth two times a day 2)  Lipitor 80 Mg Tabs (Atorvastatin calcium) .... Take 1 tab by mouth daily 3)  Cartia Xt 240 Mg Cp24 (Diltiazem hcl coated beads) .... Take 1 tab by mouth daily 4)  Omeprazole 20 Mg Cpdr (Omeprazole) .... Take 2 tablet by mouth once a day 5)  Klor-con M20 20 Meq Tbcr  (Potassium chloride crys cr) .... Take 1 tab by mouth two times a day 6)  Oscal 500/200 D-3 500-200 Mg-unit Tabs (Calcium-vitamin d) .... Take 1 tablet by mouth two times a day 7)  Fish Oil Oil (Fish oil) .... Take 1 capsule by mouth two times a day 8)  Multivitamins Tabs (Multiple vitamin) .Marland Kitchen.. 1 by mouth once daily 9)  Coreg 12.5 Mg Tabs (Carvedilol) .... Take 1 tablet by mouth two times a day 10)  Celexa 40 Mg Tabs (Citalopram hydrobromide) .... Take 1 tab by mouth every day 11)  Lisinopril-hydrochlorothiazide 20-12.5 Mg Tabs (Lisinopril-hydrochlorothiazide) .... Take 2 tablet by mouth once a day 12)  Ferrous Sulfate 325 (65 Fe) Mg Tabs (Ferrous sulfate) .Marland Kitchen.. 1 tab by mouth two times a day 13)  Nabumetone 750 Mg Tabs (Nabumetone) .... Take 1 tablet by mouth two times a day 14)  Nitroglycerin 0.3 Mg Subl (Nitroglycerin) .... Use as directed 15)  Cholestyramine  Light 4 Gm/dose Powd (Cholestyramine light) .... Take 1 packet twice a day 16)  Valacyclovir Hcl 1 Gm Tabs (Valacyclovir hcl) .Marland Kitchen.. 1 tab by mouth three times a day x 7 days 17)  Tramadol Hcl 50 Mg Tabs (Tramadol hcl) .Marland Kitchen.. 1 tab by mouth three times a day as needed pain, call if pain not controlled  Current Allergies (reviewed today): ! AUGMENTIN (AMOXICILLIN-POT CLAVULANATE)

## 2010-10-28 NOTE — Progress Notes (Signed)
Summary: speak with nurse  Phone Note Call from Patient Call back at Home Phone [phone]   Caller: Patient Reason for Call: Talk to Nurse Summary of Call: request to speak with nurse Initial call taken by: Darnell Level,  April 01, 2010 9:21 AM  Follow-up for Phone Call        have surg clearance form for Dr Haroldine Laws to sign, pt just had cath in Dec has had no problems since then, will call pt when completed Kevan Rosebush, RN  April 01, 2010 3:17 PM   pt claared for surgery, form faxed, pt aware Follow-up by: Kevan Rosebush, RN,  April 02, 2010 5:59 PM

## 2010-10-28 NOTE — Progress Notes (Signed)
Summary: refill request for nabumetone  Phone Note Refill Request Message from:  Fax from Pharmacy  Refills Requested: Medication #1:  NABUMETONE 750 MG TABS Take 1 tablet by mouth two times a day Faxed form from [facility]  is on your desk.  Initial call taken by: Marty Heck CMA, AAMA,  July 22, 2010 2:47 PM     Appended Document: refill request for nabumetone Form faxed to [facility].

## 2010-10-28 NOTE — Letter (Signed)
Summary: Results Follow up Letter  [place] [facility]  [address] [place], [place] [address]   Phone: [phone]  Fax: [phone]    11/01/2009 MRN: [id number]  [name], [name]  [address]  Dear Ms. Tonya [name],  The following are the results of your recent test(s):  Test         Result    Pap Smear:        Normal __X___  Not Normal _____ Comments: Please repeat every 1-3 years.  ______________________________________________________ Cholesterol: LDL(Bad cholesterol):         Your goal is less than:         HDL (Good cholesterol):       Your goal is more than: Comments:  ______________________________________________________ Mammogram:        Normal _____  Not Normal _____ Comments:  ___________________________________________________________________ Hemoccult:        Normal _____  Not normal _______ Comments:    _____________________________________________________________________ Other Tests:    We routinely do not discuss normal results over the telephone.  If you desire a copy of the results, or you have any questions about this information we can discuss them at your next office visit.   Sincerely,        [name], MD

## 2010-10-28 NOTE — Progress Notes (Signed)
Summary: form for surgical clearance  Phone Note Call from Patient Call back at Home Phone [phone]   Caller: Patient Call For: [name] MD Summary of Call: Pt is calling about the surgical clearance form that she dropped off last week.  This form is on your desk and pt is asking if you can complete it on friday.  Her cardiologist also has a form to complete. Initial call taken by: Marty Heck CMA,  March 26, 2010 2:38 PM  Follow-up for Phone Call        In outbox. Follow-up by: [name] MD,  March 28, 2010 1:48 PM  Additional Follow-up for Phone Call Additional follow up Details #1::        patient advised.Cathlean Cower CMA   Additional Follow-up by: Zenda Alpers CMA Deborra Medina),  March 28, 2010 2:03 PM

## 2010-10-30 NOTE — Medication Information (Signed)
Summary: Rx for Omeprazole  Rx for Omeprazole   Imported By: Laural Benes 10/20/2010 09:51:47  _____________________________________________________________________  External Attachment:    Type:   Image     Comment:   External Document

## 2010-10-30 NOTE — Assessment & Plan Note (Signed)
Summary: per check  out/sf   Visit Type:  Follow-up Referring Provider:  n/a Primary Provider:  [name], MD  CC:  no compliants.  History of Present Illness: Tonya [name] is a delightful 70 year old with a history of coronary artery disease status post bare-metal stenting of the proximal LAD in 2007.  She had some mild residual coronary artery disease elsewhere with normal ejection fraction.  She also has a history of colon CA s/p resection, diabetes, hypertension, hyperlipidemia, and gastroesophageal reflux disease.   She returns for 1 year follow-up.    In 2010 had stress test for DOE. ?apical ischemia. Cath showed patent LAD stent with EF 60-65%.   Had R TKA in August 2011 which had to be revised due to bleeding. Ambulating without CP/SOB.  BP typically 120s/70s. Dr. Diona Browner following lipids. Most recent LDL 32 in 10/11. Trig were 233. Dr. Diona Browner asked her to increase to 3 tabs of fish oil but she hasn't done it yet.   Current Medications (verified): 1)  Glucovance 1.25-250 Mg Tabs (Glyburide-Metformin) .Marland Kitchen.. 1 Tab By Mouth Two Times A Day 2)  Lipitor 80 Mg Tabs (Atorvastatin Calcium) .... Take 1 Tab By Mouth Daily 3)  Cartia Xt 240 Mg Cp24 (Diltiazem Hcl Coated Beads) .... Take 1 Tab By Mouth Daily 4)  Omeprazole 20 Mg Cpdr (Omeprazole) .... Take 2 Tablet By Mouth Once A Day 5)  Klor-Con M20 20 Meq  Tbcr (Potassium Chloride Crys Cr) .... Take 1 Tab By Mouth Two Times A Day 6)  Oscal 500/200 D-3 500-200 Mg-Unit  Tabs (Calcium-Vitamin D) .... Take 1 Tablet By Mouth Two Times A Day 7)  Fish Oil   Oil (Fish Oil) .... Take 1 Capsule By Mouth Two Times A Day 8)  Multivitamins   Tabs (Multiple Vitamin) .Marland Kitchen.. 1 By Mouth Once Daily 9)  Coreg 12.5 Mg  Tabs (Carvedilol) .... Take 1 Tablet By Mouth Two Times A Day 10)  Celexa 40 Mg Tabs (Citalopram Hydrobromide) .... Take 1 Tab By Mouth Every Day 11)  Lisinopril-Hydrochlorothiazide 20-12.5 Mg Tabs (Lisinopril-Hydrochlorothiazide) .... Take 2  Tablet By Mouth Once A Day 12)  Ferrous Sulfate 325 (65 Fe) Mg Tabs (Ferrous Sulfate) .Marland Kitchen.. 1 Tab By Mouth Two Times A Day 13)  Nabumetone 750 Mg Tabs (Nabumetone) .... Take 1 Tablet By Mouth Two Times A Day 14)  Nitroglycerin 0.3 Mg Subl (Nitroglycerin) .... Use As Directed 15)  Cholestyramine Light 4 Gm/dose  Powd (Cholestyramine Light) .... Take 1 Packet Twice A Day 16)  Nystatin 100000 Unit/gm Crea (Nystatin) .... Apply Three Times A Day As Directed Until Rash Completely Gone and For 5 Days After This  Allergies (verified): 1)  ! Augmentin  Past History:  Past Medical History: 1. Osteoarthritis s/p R TKA 2. PERIPHERAL EDEMA   3. CORONARY ARTERY DISEASE s/p BMS to LAD      --cath 12/10: LAD stent patent. insignifcant CAD otherwise EF 60-65% 4. BREAST PAIN, LEFT 5. HYPERLIPIDEMIA   6. HYPERTENSION   7. SINUSITIS - ACUTE-NOS  8. ALLERGIC RHINITIS   9. HEMOCCULT POSITIVE STOOL   10. ADENOCARCINOMA, COLON, CECUM   11. ANEMIA   12. SCIATICA  13. CYSTITIS, ACUTE  14. DIARRHEA, CHRONIC   15. NAUSEA  16. GERD  17. DEPRESSION  18. ANXIETY  19. OSTEOARTHRITIS   20. IBS   21. DEGENERATIVE JOINT DISEASE  22. SYNDROME, CARPAL TUNNEL  23. DIABETES MELLITUS, TYPE II  24. FAMILY HISTORY DIABETES 1ST DEGREE RELATIVE    Review of  Systems       As per HPI and past medical history; otherwise all systems negative.   Vital Signs:  Patient profile:   70 year old female Height:      66.25 inches Weight:      169 pounds BMI:     27.17 Pulse rate:   60 / minute BP sitting:   154 / 60  (left arm) Cuff size:   regular  Vitals Entered By: Mignon Pine, RMA (September 08, 2010 9:12 AM)  Physical Exam  General:  Well appearing. no resp difficulty HEENT: normal Neck: supple. no JVD. Carotids 2+ bilat; no bruits. No lymphadenopathy or thryomegaly appreciated. Cor: PMI nondisplaced. Regular rate & rhythm. No rubs, gallops, murmur. Lungs: clear Abdomen: soft, nontender, nondistended.  No hepatosplenomegaly. No bruits or masses. Good bowel sounds. Extremities: no cyanosis, clubbing, rash, edema Neuro: alert & orientedx3, cranial nerves grossly intact. moves all 4 extremities w/o difficulty. affect pleasant    Impression & Recommendations:  Problem # 1:  CORONARY ARTERY DISEASE (ICD-414.00) Stable. No evidence of ischemia.  Recent cath looks good. Continue current regimen.  Problem # 2:  HYPERLIPIDEMIA (B2193296.4) LDL at goal. TGsw still mildly elevated. Reinforced the need to increase fish oil to 3g per day.   Problem # 3:  HYPERTENSION (ICD-401.9) BP elevated here but reports good control at home. Asked her to keep a closer eye on it and make sure SBP < 140 cosistently. F/u with Dr. Diona Browner.   Other Orders: EKG w/ Interpretation (93000)  Patient Instructions: 1)  Increase Fish Oil to 3 caps daily 2)  Your physician wants you to follow-up in:  1 year.  You will receive a reminder letter in the mail two months in advance. If you don't receive a letter, please call our office to schedule the follow-up appointment.

## 2010-10-30 NOTE — Progress Notes (Signed)
Summary: refill request for omeprazole  Phone Note Refill Request Message from:  Fax from Pharmacy  Refills Requested: Medication #1:  OMEPRAZOLE 20 MG CPDR Take 2 tablet by mouth once a day Faxed request from [employer], mail order, is on your desk.  This was sent electronically but they may not have gotten it.  Initial call taken by: Marty Heck CMA, AAMA,  October 09, 2010 9:12 AM

## 2010-10-30 NOTE — Assessment & Plan Note (Signed)
Summary: 3 M F/U DLO   Vital Signs:  Patient profile:   70 year old female Height:      66.25 inches Weight:      169.50 pounds BMI:     27.25 Temp:     98.6 degrees F oral Pulse rate:   60 / minute Pulse rhythm:   regular BP sitting:   120 / 84  (left arm) Cuff size:   regular  Vitals Entered By: Zenda Alpers CMA Deborra Medina) (October 17, 2010 10:37 AM)  History of Present Illness: Chief complaint 3 month follow up  High chol: on lipitor . LDL  at goal <70. Triglycerides were  not at goal last check but has restarted fish oil... on 3000 mg.  DM, well controlled on glucovance despite decrease in dose.  Continue lower dose at this time.  No further low blood sugars. FBS 85-110  HTN on cartia, coreg, lisinopril... At home and in rehab 120-145/ 60-75.. BP usually higher if right knee pain. Better here today than at Cardiology.. diastolic still above goal 80 here.   CAD.Marland Kitchen reviewed last OV from Dr. Jerald Kief 08/2010 No changes made. Follow up scheuduled  in 1 year.   HX of paroxsysmal Afib although NOT NOTED IN CARDS NOTE.Marland Kitchen? true diagnosis.Marland Kitchen not on anticoagulation  presumably.because of GI bleed history.    In last year right total knee replacement.Marland Kitchen still using NSAIDS fo arthritis Hx of colon adenocarcoinoma.. colectomy 2010.    Preventive Screening-Counseling & Management  Caffeine-Diet-Exercise     Diet Comments: healthy     Diet Counseling: not indicated; diet is assessed to be healthy     Does Patient Exercise: yes     Type of exercise: walking      Exercise Counseling: plans to increase exercsie and start indoor bike.  Problems Prior to Update: 1)  Routine Gynecological Examination  (ICD-V72.31) 2)  Atrial Fibrillation  (ICD-427.31) 3)  Coronary Artery Disease  (ICD-414.00) 4)  Hyperlipidemia  (ICD-272.4) 5)  Hypertension  (ICD-401.9) 6)  Allergic Rhinitis  (ICD-477.9) 7)  Adenocarcinoma, Colon, Cecum  (ICD-153.4) 8)  Gerd  (ICD-530.81) 9)  Depression   (ICD-311) 10)  Anxiety  (ICD-300.00) 11)  Osteoarthritis  (ICD-715.90) 12)  Ibs  (ICD-564.1) 13)  Degenerative Joint Disease  (ICD-715.90) 14)  Syndrome, Carpal Tunnel  (ICD-354.0) 15)  Diabetes Mellitus, Type II  (ICD-250.00) 16)  Family History Diabetes 1st Degree Relative  (ICD-V18.0)  Current Medications (verified): 1)  Glucovance 1.25-250 Mg Tabs (Glyburide-Metformin) .Marland Kitchen.. 1 Tab By Mouth Two Times A Day 2)  Lipitor 80 Mg Tabs (Atorvastatin Calcium) .... Take 1 Tab By Mouth Daily 3)  Cartia Xt 240 Mg Cp24 (Diltiazem Hcl Coated Beads) .... Take 1 Tab By Mouth Daily 4)  Omeprazole 20 Mg Cpdr (Omeprazole) .... Take 2 Tablet By Mouth Once A Day 5)  Klor-Con M20 20 Meq  Tbcr (Potassium Chloride Crys Cr) .... Take 1 Tab By Mouth Two Times A Day 6)  Oscal 500/200 D-3 500-200 Mg-Unit  Tabs (Calcium-Vitamin D) .... Take 1 Tablet By Mouth Two Times A Day 7)  Fish Oil   Oil (Fish Oil) .... Take 3 Caps Daily 8)  Multivitamins   Tabs (Multiple Vitamin) .Marland Kitchen.. 1 By Mouth Once Daily 9)  Coreg 12.5 Mg  Tabs (Carvedilol) .... Take 1 Tablet By Mouth Two Times A Day 10)  Celexa 40 Mg Tabs (Citalopram Hydrobromide) .... Take 1 Tab By Mouth Every Day 11)  Lisinopril-Hydrochlorothiazide 20-12.5 Mg Tabs (Lisinopril-Hydrochlorothiazide) .... Take 2 Tablet  By Mouth Once A Day 12)  Ferrous Sulfate 325 (65 Fe) Mg Tabs (Ferrous Sulfate) .Marland Kitchen.. 1 Tab By Mouth Two Times A Day 13)  Nabumetone 750 Mg Tabs (Nabumetone) .... Take 1 Tablet By Mouth Two Times A Day 14)  Nitroglycerin 0.3 Mg Subl (Nitroglycerin) .... Use As Directed 15)  Cholestyramine Light 4 Gm/dose  Powd (Cholestyramine Light) .... Take 1 Packet Twice A Day  Allergies: 1)  ! Augmentin  Past History:  Past medical, surgical, family and social histories (including risk factors) reviewed, and no changes noted (except as noted below).  Past Medical History: Reviewed history from 09/08/2010 and no changes required. 1. Osteoarthritis s/p R TKA 2.  PERIPHERAL EDEMA   3. CORONARY ARTERY DISEASE s/p BMS to LAD      --cath 12/10: LAD stent patent. insignifcant CAD otherwise EF 60-65% 4. BREAST PAIN, LEFT 5. HYPERLIPIDEMIA   6. HYPERTENSION   7. SINUSITIS - ACUTE-NOS  8. ALLERGIC RHINITIS   9. HEMOCCULT POSITIVE STOOL   10. ADENOCARCINOMA, COLON, CECUM   11. ANEMIA   12. SCIATICA  13. CYSTITIS, ACUTE  14. DIARRHEA, CHRONIC   15. NAUSEA  16. GERD  17. DEPRESSION  18. ANXIETY  19. OSTEOARTHRITIS   20. IBS   21. DEGENERATIVE JOINT DISEASE  22. SYNDROME, CARPAL TUNNEL  23. DIABETES MELLITUS, TYPE II  24. FAMILY HISTORY DIABETES 1ST DEGREE RELATIVE    Past Surgical History: Reviewed history from 07/17/2010 and no changes required. Arthroscopy both knees EGD 10-20-04, gastritis colonoscopy 10-16-04 per Dr. Carlean Purl, repeat in 5 yrs Cholecystectomy 12/2005 stent, Dr Zoila Shutter 11/2008 partial right colectomy for adenocarcinoma 04/2010 right knee replacement, complicated by hemarthrosis and second surgery for hematoma evacuation  Family History: Reviewed history from 11/23/2007 and no changes required. Family History Diabetes 1st degree relative Family History Hypertension mother; massive MI age 88, prostate cancer father, massive MI age 81  Social History: Reviewed history from 11/23/2007 and no changes required. Retired Single, divorced Never Smoked Alcohol use-no Drug use-no Regular exercise-no, limited due to knees Diet: addicted to sweets, some fruit and veggies, water daily  Review of Systems General:  Denies fatigue. CV:  Denies chest pain or discomfort. Resp:  Denies shortness of breath. GI:  Denies abdominal pain, bloody stools, constipation, and diarrhea. GU:  Denies dysuria.  Physical Exam  General:  Well-developed,well-nourished,in no acute distress; alert,appropriate and cooperative throughout examination Mouth:  Oral mucosa and oropharynx without lesions or exudates.  Teeth in good repair. Neck:  no  carotid bruit or thyromegaly no cervical or supraclavicular lymphadenopathy  Lungs:  Normal respiratory effort, chest expands symmetrically. Lungs are clear to auscultation, no crackles or wheezes. Heart:  Normal rate and regular rhythm. S1 and S2 normal without gallop, murmur, click, rub or other extra sounds. Abdomen:  Bowel sounds positive,abdomen soft and non-tender without masses, organomegaly or hernias noted. Pulses:  R and L posterior tibial pulses are full and equal bilaterally  Extremities:  no edmea   Diabetes Management Exam:    Foot Exam (with socks and/or shoes not present):       Sensory-Pinprick/Light touch:          Left medial foot (L-4): normal          Left dorsal foot (L-5): normal          Left lateral foot (S-1): normal          Right medial foot (L-4): normal          Right dorsal foot (L-5):  normal          Right lateral foot (S-1): normal       Sensory-Monofilament:          Left foot: normal          Right foot: normal       Inspection:          Left foot: normal          Right foot: normal       Nails:          Left foot: normal          Right foot: normal   Impression & Recommendations:  Problem # 1:  DIABETES MELLITUS, TYPE II (ICD-250.00) Control remains good on lower dose glucovance...no further low CBGs Her updated medication list for this problem includes:    Glucovance 1.25-250 Mg Tabs (Glyburide-metformin) .Marland Kitchen... 1 tab by mouth two times a day    Lisinopril-hydrochlorothiazide 20-12.5 Mg Tabs (Lisinopril-hydrochlorothiazide) .Marland Kitchen... Take 2 tablet by mouth once a day  Problem # 2:  HYPERTENSION (ICD-401.9) Borderline control but better than last check. If BPs remaining occ above 145 at home.. will add and ARB to regimen. HR 60s so cannot increase cartia and coreg. ACEI at maximum.  Her updated medication list for this problem includes:    Cartia Xt 240 Mg Cp24 (Diltiazem hcl coated beads) .Marland Kitchen... Take 1 tab by mouth daily    Coreg 12.5 Mg Tabs  (Carvedilol) .Marland Kitchen... Take 1 tablet by mouth two times a day    Lisinopril-hydrochlorothiazide 20-12.5 Mg Tabs (Lisinopril-hydrochlorothiazide) .Marland Kitchen... Take 2 tablet by mouth once a day  Problem # 3:  CORONARY ARTERY DISEASE (ICD-414.00) stable per cards.  Her updated medication list for this problem includes:    Cartia Xt 240 Mg Cp24 (Diltiazem hcl coated beads) .Marland Kitchen... Take 1 tab by mouth daily    Coreg 12.5 Mg Tabs (Carvedilol) .Marland Kitchen... Take 1 tablet by mouth two times a day    Lisinopril-hydrochlorothiazide 20-12.5 Mg Tabs (Lisinopril-hydrochlorothiazide) .Marland Kitchen... Take 2 tablet by mouth once a day    Nitroglycerin 0.3 Mg Subl (Nitroglycerin) ..... Use as directed  Problem # 4:  ATRIAL FIBRILLATION (ICD-427.31) Assessment: Comment Only Not noted in [place].. not on anticoagulation.. ?true diagnosis... inconcistence noted after pt left.. will call to inquire. Her updated medication list for this problem includes:    Cartia Xt 240 Mg Cp24 (Diltiazem hcl coated beads) .Marland Kitchen... Take 1 tab by mouth daily    Coreg 12.5 Mg Tabs (Carvedilol) .Marland Kitchen... Take 1 tablet by mouth two times a day  Complete Medication List: 1)  Glucovance 1.25-250 Mg Tabs (Glyburide-metformin) .Marland Kitchen.. 1 tab by mouth two times a day 2)  Lipitor 80 Mg Tabs (Atorvastatin calcium) .... Take 1 tab by mouth daily 3)  Cartia Xt 240 Mg Cp24 (Diltiazem hcl coated beads) .... Take 1 tab by mouth daily 4)  Omeprazole 20 Mg Cpdr (Omeprazole) .... Take 2 tablet by mouth once a day 5)  Klor-con M20 20 Meq Tbcr (Potassium chloride crys cr) .... Take 1 tab by mouth two times a day 6)  Oscal 500/200 D-3 500-200 Mg-unit Tabs (Calcium-vitamin d) .... Take 1 tablet by mouth two times a day 7)  Fish Oil Oil (Fish oil) .... Take 3 caps daily 8)  Multivitamins Tabs (Multiple vitamin) .Marland Kitchen.. 1 by mouth once daily 9)  Coreg 12.5 Mg Tabs (Carvedilol) .... Take 1 tablet by mouth two times a day 10)  Celexa 40 Mg Tabs (Citalopram hydrobromide) .Marland KitchenMarland KitchenMarland Kitchen  Take 1 tab by  mouth every day 11)  Lisinopril-hydrochlorothiazide 20-12.5 Mg Tabs (Lisinopril-hydrochlorothiazide) .... Take 2 tablet by mouth once a day 12)  Ferrous Sulfate 325 (65 Fe) Mg Tabs (Ferrous sulfate) .Marland Kitchen.. 1 tab by mouth two times a day 13)  Nabumetone 750 Mg Tabs (Nabumetone) .... Take 1 tablet by mouth two times a day 14)  Nitroglycerin 0.3 Mg Subl (Nitroglycerin) .... Use as directed 15)  Cholestyramine Light 4 Gm/dose Powd (Cholestyramine light) .... Take 1 packet twice a day  Patient Instructions: 1)  BMP prior to visit, ICD-9: 250.00, 272.0 2)  Hepatic Panel prior to visit ICD-9:  3)  Lipid panel prior to visit ICD-9 :  4)  HgBA1c prior to visit  ICD-9:  5)   cbc  6)  Please schedule annual medicare wellness t in 3 months DM, chol.    Orders Added: 1)  Est. Patient Level IV [id number]    Current Allergies (reviewed today): ! AUGMENTIN  Appended Document: 3 M F/U DLO Please call pt... there may be an inconcistency in our medical record.. does she have a history of atrial fibrillation or paroxsysmal atrial fibrillation or neither?  Appended Document: 3 M F/U DLO Patient had event irregular heart rate went to the emergency room but, when she arrived at the er it was back normal this happened many years ago when patient was seeing dr Linda Hedges.[place]

## 2010-12-02 ENCOUNTER — Encounter: Payer: Self-pay | Admitting: Family Medicine

## 2010-12-11 ENCOUNTER — Telehealth: Payer: Self-pay | Admitting: Family Medicine

## 2010-12-11 LAB — BASIC METABOLIC PANEL
BUN: 7 mg/dL (ref 6–23)
CO2: 24 mEq/L (ref 19–32)
CO2: 25 mEq/L (ref 19–32)
Calcium: 7.5 mg/dL — ABNORMAL LOW (ref 8.4–10.5)
Chloride: 104 mEq/L (ref 96–112)
Chloride: 99 mEq/L (ref 96–112)
Creatinine, Ser: 1.01 mg/dL (ref 0.4–1.2)
GFR calc Af Amer: >60 mL/min (ref >60)
Glucose, Bld: 71 mg/dL (ref 70–99)
Potassium: 3.9 mEq/L (ref 3.5–5.1)
Potassium: 4.4 mEq/L (ref 3.5–5.1)
Sodium: 132 mEq/L — ABNORMAL LOW (ref 135–145)

## 2010-12-11 LAB — CBC
HCT: 25.9 % — ABNORMAL LOW (ref 36.0–46.0)
HCT: 26.3 % — ABNORMAL LOW (ref 36.0–46.0)
Hemoglobin: 8.4 g/dL — ABNORMAL LOW (ref 12.0–15.0)
MCH: 28.6 pg (ref 26.0–34.0)
MCH: 28.7 pg (ref 26.0–34.0)
MCV: 86.8 fL (ref 78.0–100.0)
MCV: 88.1 fL (ref 78.0–100.0)
RBC: 2.94 MIL/uL — ABNORMAL LOW (ref 3.87–5.11)
RDW: 15 % (ref 11.5–15.5)
WBC: 11 10*3/uL — ABNORMAL HIGH (ref 4.0–10.5)

## 2010-12-11 LAB — GLUCOSE, CAPILLARY
Glucose-Capillary: 114 mg/dL — ABNORMAL HIGH (ref 70–99)
Glucose-Capillary: 119 mg/dL — ABNORMAL HIGH (ref 70–99)
Glucose-Capillary: 142 mg/dL — ABNORMAL HIGH (ref 70–99)
Glucose-Capillary: 164 mg/dL — ABNORMAL HIGH (ref 70–99)
Glucose-Capillary: 189 mg/dL — ABNORMAL HIGH (ref 70–99)
Glucose-Capillary: 79 mg/dL (ref 70–99)
Glucose-Capillary: 81 mg/dL (ref 70–99)

## 2010-12-11 LAB — PROTIME-INR
Prothrombin Time: 20.9 seconds — ABNORMAL HIGH (ref 11.6–15.2)
Prothrombin Time: 27.4 seconds — ABNORMAL HIGH (ref 11.6–15.2)

## 2010-12-12 LAB — CBC
HCT: 24.6 % — ABNORMAL LOW (ref 36.0–46.0)
HCT: 26.4 % — ABNORMAL LOW (ref 36.0–46.0)
HCT: 28.5 % — ABNORMAL LOW (ref 36.0–46.0)
HCT: 25.5 % — ABNORMAL LOW (ref 36.0–46.0)
HCT: 27 % — ABNORMAL LOW (ref 36.0–46.0)
HCT: 28.2 % — ABNORMAL LOW (ref 36.0–46.0)
HCT: 30 % — ABNORMAL LOW (ref 36.0–46.0)
Hemoglobin: 8.1 g/dL — ABNORMAL LOW (ref 12.0–15.0)
Hemoglobin: 8.3 g/dL — ABNORMAL LOW (ref 12.0–15.0)
Hemoglobin: 8.7 g/dL — ABNORMAL LOW (ref 12.0–15.0)
Hemoglobin: 9.6 g/dL — ABNORMAL LOW (ref 12.0–15.0)
Hemoglobin: 10 g/dL — ABNORMAL LOW (ref 12.0–15.0)
Hemoglobin: 8.6 g/dL — ABNORMAL LOW (ref 12.0–15.0)
Hemoglobin: 8.8 g/dL — ABNORMAL LOW (ref 12.0–15.0)
Hemoglobin: 9.7 g/dL — ABNORMAL LOW (ref 12.0–15.0)
MCH: 28.6 pg (ref 26.0–34.0)
MCH: 28.9 pg (ref 26.0–34.0)
MCH: 29.1 pg (ref 26.0–34.0)
MCH: 29.2 pg (ref 26.0–34.0)
MCH: 30.9 pg (ref 26.0–34.0)
MCH: 31.1 pg (ref 26.0–34.0)
MCHC: 32.9 g/dL (ref 30.0–36.0)
MCHC: 33 g/dL (ref 30.0–36.0)
MCHC: 33.7 g/dL (ref 30.0–36.0)
MCHC: 33.9 g/dL (ref 30.0–36.0)
MCHC: 33.3 g/dL (ref 30.0–36.0)
MCHC: 33.5 g/dL (ref 30.0–36.0)
MCHC: 33.5 g/dL (ref 30.0–36.0)
MCHC: 33.7 g/dL (ref 30.0–36.0)
MCV: 86 fL (ref 78.0–100.0)
MCV: 87.2 fL (ref 78.0–100.0)
MCV: 91 fL (ref 78.0–100.0)
MCV: 91.7 fL (ref 78.0–100.0)
MCV: 92.2 fL (ref 78.0–100.0)
MCV: 92.3 fL (ref 78.0–100.0)
Platelets: 100 10*3/uL — ABNORMAL LOW (ref 150–400)
Platelets: 153 10*3/uL (ref 150–400)
Platelets: 164 10*3/uL (ref 150–400)
RBC: 2.84 MIL/uL — ABNORMAL LOW (ref 3.87–5.11)
RBC: 2.99 MIL/uL — ABNORMAL LOW (ref 3.87–5.11)
RBC: 2.85 MIL/uL — ABNORMAL LOW (ref 3.87–5.11)
RBC: 3.1 MIL/uL — ABNORMAL LOW (ref 3.87–5.11)
RBC: 3.24 MIL/uL — ABNORMAL LOW (ref 3.87–5.11)
RDW: 13.1 % (ref 11.5–15.5)
RDW: 13.5 % (ref 11.5–15.5)
RDW: 12.5 % (ref 11.5–15.5)
RDW: 12.6 % (ref 11.5–15.5)
RDW: 12.8 % (ref 11.5–15.5)
RDW: 12.8 % (ref 11.5–15.5)
WBC: 16.1 10*3/uL — ABNORMAL HIGH (ref 4.0–10.5)
WBC: 12.8 10*3/uL — ABNORMAL HIGH (ref 4.0–10.5)
WBC: 13.2 10*3/uL — ABNORMAL HIGH (ref 4.0–10.5)
WBC: 14 10*3/uL — ABNORMAL HIGH (ref 4.0–10.5)
WBC: 14.5 10*3/uL — ABNORMAL HIGH (ref 4.0–10.5)

## 2010-12-12 LAB — HEPATIC FUNCTION PANEL: Bilirubin, Direct: <0.1 mg/dL (ref 0.0–0.3)

## 2010-12-12 LAB — BASIC METABOLIC PANEL
BUN: 8 mg/dL (ref 6–23)
BUN: 15 mg/dL (ref 6–23)
CO2: 23 mEq/L (ref 19–32)
CO2: 24 mEq/L (ref 19–32)
Calcium: 7.7 mg/dL — ABNORMAL LOW (ref 8.4–10.5)
Calcium: 8.1 mg/dL — ABNORMAL LOW (ref 8.4–10.5)
Chloride: 101 mEq/L (ref 96–112)
Chloride: 107 mEq/L (ref 96–112)
Creatinine, Ser: 1.06 mg/dL (ref 0.4–1.2)
Creatinine, Ser: 1.36 mg/dL — ABNORMAL HIGH (ref 0.4–1.2)
GFR calc Af Amer: >60 mL/min (ref >60)
GFR calc Af Amer: >60 mL/min (ref >60)
GFR calc Af Amer: 57 mL/min — ABNORMAL LOW (ref >60)
GFR calc non Af Amer: 56 mL/min — ABNORMAL LOW (ref >60)
GFR calc non Af Amer: 38 mL/min — ABNORMAL LOW (ref >60)
GFR calc non Af Amer: 38 mL/min — ABNORMAL LOW (ref >60)
GFR calc non Af Amer: 47 mL/min — ABNORMAL LOW (ref >60)
Glucose, Bld: 170 mg/dL — ABNORMAL HIGH (ref 70–99)
Glucose, Bld: 175 mg/dL — ABNORMAL HIGH (ref 70–99)
Glucose, Bld: 178 mg/dL — ABNORMAL HIGH (ref 70–99)
Potassium: 4.4 mEq/L (ref 3.5–5.1)
Potassium: 4.9 mEq/L (ref 3.5–5.1)
Sodium: 134 mEq/L — ABNORMAL LOW (ref 135–145)
Sodium: 135 mEq/L (ref 135–145)
Sodium: 137 mEq/L (ref 135–145)

## 2010-12-12 LAB — PREPARE FRESH FROZEN PLASMA

## 2010-12-12 LAB — DIFFERENTIAL
Eosinophils Absolute: 0.1 10*3/uL (ref 0.0–0.7)
Lymphocytes Relative: 12 % (ref 12–46)
Lymphocytes Relative: 16 % (ref 12–46)
Lymphs Abs: 1.5 10*3/uL (ref 0.7–4.0)
Monocytes Absolute: 1.6 10*3/uL — ABNORMAL HIGH (ref 0.1–1.0)
Monocytes Relative: 10 % (ref 3–12)
Monocytes Relative: 9 % (ref 3–12)
Neutro Abs: 12.5 10*3/uL — ABNORMAL HIGH (ref 1.7–7.7)
Neutro Abs: 7 10*3/uL (ref 1.7–7.7)
Neutrophils Relative %: 74 % (ref 43–77)

## 2010-12-12 LAB — GRAM STAIN

## 2010-12-12 LAB — ANAEROBIC CULTURE

## 2010-12-12 LAB — GLUCOSE, CAPILLARY
Glucose-Capillary: 114 mg/dL — ABNORMAL HIGH (ref 70–99)
Glucose-Capillary: 125 mg/dL — ABNORMAL HIGH (ref 70–99)
Glucose-Capillary: 127 mg/dL — ABNORMAL HIGH (ref 70–99)
Glucose-Capillary: 129 mg/dL — ABNORMAL HIGH (ref 70–99)
Glucose-Capillary: 100 mg/dL — ABNORMAL HIGH (ref 70–99)
Glucose-Capillary: 132 mg/dL — ABNORMAL HIGH (ref 70–99)
Glucose-Capillary: 152 mg/dL — ABNORMAL HIGH (ref 70–99)
Glucose-Capillary: 153 mg/dL — ABNORMAL HIGH (ref 70–99)
Glucose-Capillary: 175 mg/dL — ABNORMAL HIGH (ref 70–99)
Glucose-Capillary: 185 mg/dL — ABNORMAL HIGH (ref 70–99)
Glucose-Capillary: 196 mg/dL — ABNORMAL HIGH (ref 70–99)

## 2010-12-12 LAB — CULTURE, ROUTINE-ABSCESS: Culture: NO GROWTH

## 2010-12-12 LAB — TISSUE CULTURE
Culture: NO GROWTH
Gram Stain: NONE SEEN

## 2010-12-12 LAB — URINALYSIS, ROUTINE W REFLEX MICROSCOPIC
Glucose, UA: NEGATIVE mg/dL
Hgb urine dipstick: NEGATIVE
Ketones, ur: 40 mg/dL — AB
pH: 5 (ref 5.0–8.0)

## 2010-12-12 LAB — POCT I-STAT, CHEM 8
BUN: 17 mg/dL (ref 6–23)
Calcium, Ion: 1.08 mmol/L — ABNORMAL LOW (ref 1.12–1.32)
Chloride: 98 mEq/L (ref 96–112)
Creatinine, Ser: 1 mg/dL (ref 0.4–1.2)
Glucose, Bld: 163 mg/dL — ABNORMAL HIGH (ref 70–99)

## 2010-12-12 LAB — URINE CULTURE: Culture  Setup Time: 201108311352

## 2010-12-12 LAB — SYNOVIAL CELL COUNT + DIFF, W/ CRYSTALS
Eosinophils-Synovial: 0 % (ref 0–1)
WBC, Synovial: UNDETERMINED /mm3 (ref 0–200)

## 2010-12-12 LAB — TYPE AND SCREEN

## 2010-12-12 LAB — URINALYSIS, MICROSCOPIC ONLY
Glucose, UA: NEGATIVE mg/dL
Hgb urine dipstick: NEGATIVE
Specific Gravity, Urine: 1.013 (ref 1.005–1.030)
Urobilinogen, UA: 0.2 mg/dL (ref 0.0–1.0)
pH: 5 (ref 5.0–8.0)

## 2010-12-12 LAB — BODY FLUID CULTURE: Culture: NO GROWTH

## 2010-12-12 LAB — PROTIME-INR
INR: 2.78 — ABNORMAL HIGH (ref 0.00–1.49)
INR: 1.06 (ref 0.00–1.49)
Prothrombin Time: 29.4 seconds — ABNORMAL HIGH (ref 11.6–15.2)
Prothrombin Time: 19.1 seconds — ABNORMAL HIGH (ref 11.6–15.2)

## 2010-12-12 LAB — COMPREHENSIVE METABOLIC PANEL
BUN: 9 mg/dL (ref 6–23)
CO2: 22 mEq/L (ref 19–32)
Calcium: 8.2 mg/dL — ABNORMAL LOW (ref 8.4–10.5)
Creatinine, Ser: 0.93 mg/dL (ref 0.4–1.2)
GFR calc non Af Amer: 60 mL/min — ABNORMAL LOW (ref >60)
Glucose, Bld: 84 mg/dL (ref 70–99)
Sodium: 131 mEq/L — ABNORMAL LOW (ref 135–145)
Total Protein: 6.2 g/dL (ref 6.0–8.3)

## 2010-12-12 LAB — HEMOGLOBIN A1C
Hgb A1c MFr Bld: 6.1 % — ABNORMAL HIGH (ref <5.7)
Hgb A1c MFr Bld: 6.1 % — ABNORMAL HIGH (ref <5.7)
Mean Plasma Glucose: 128 mg/dL — ABNORMAL HIGH (ref <117)

## 2010-12-12 LAB — HM DIABETES EYE EXAM

## 2010-12-13 LAB — COMPREHENSIVE METABOLIC PANEL
ALT: 39 U/L — ABNORMAL HIGH (ref 0–35)
AST: 46 U/L — ABNORMAL HIGH (ref 0–37)
Alkaline Phosphatase: 69 U/L (ref 39–117)
CO2: 25 mEq/L (ref 19–32)
Calcium: 9.6 mg/dL (ref 8.4–10.5)
GFR calc Af Amer: 58 mL/min — ABNORMAL LOW (ref >60)
GFR calc non Af Amer: 48 mL/min — ABNORMAL LOW (ref >60)
Potassium: 4.5 mEq/L (ref 3.5–5.1)
Sodium: 139 mEq/L (ref 135–145)

## 2010-12-13 LAB — DIFFERENTIAL
Basophils Relative: 0 % (ref 0–1)
Eosinophils Absolute: 0.3 10*3/uL (ref 0.0–0.7)
Eosinophils Relative: 2 % (ref 0–5)
Lymphs Abs: 2.2 10*3/uL (ref 0.7–4.0)
Monocytes Relative: 6 % (ref 3–12)

## 2010-12-13 LAB — URINE MICROSCOPIC-ADD ON

## 2010-12-13 LAB — URINE CULTURE

## 2010-12-13 LAB — URINALYSIS, ROUTINE W REFLEX MICROSCOPIC
Bilirubin Urine: NEGATIVE
Glucose, UA: NEGATIVE mg/dL
Hgb urine dipstick: NEGATIVE
Specific Gravity, Urine: 1.013 (ref 1.005–1.030)

## 2010-12-13 LAB — CBC
HCT: 38.1 % (ref 36.0–46.0)
Hemoglobin: 13.1 g/dL (ref 12.0–15.0)
MCHC: 34.4 g/dL (ref 30.0–36.0)
RBC: 4.01 MIL/uL (ref 3.87–5.11)
WBC: 11.4 10*3/uL — ABNORMAL HIGH (ref 4.0–10.5)

## 2010-12-13 LAB — TYPE AND SCREEN
ABO/RH(D): A POS
Antibody Screen: NEGATIVE

## 2010-12-13 LAB — SURGICAL PCR SCREEN
MRSA, PCR: NEGATIVE
Staphylococcus aureus: NEGATIVE

## 2010-12-13 LAB — PROTIME-INR: Prothrombin Time: 12.3 seconds (ref 11.6–15.2)

## 2010-12-16 NOTE — Progress Notes (Signed)
Summary: refill reqeust for nabumetone  Phone Note Refill Request Message from:  Fax from Pharmacy  Refills Requested: Medication #1:  NABUMETONE 750 MG TABS Take 1 tablet by mouth two times a day Pt needs new script for nabumetone, form from [facility] mail order pharmacy is on your  desk.   Initial call taken by: Marty Heck CMA, AAMA,  December 11, 2010 12:01 PM

## 2010-12-22 LAB — GLUCOSE, CAPILLARY: Glucose-Capillary: 149 mg/dL — ABNORMAL HIGH (ref 70–99)

## 2010-12-29 LAB — BASIC METABOLIC PANEL
Chloride: 107 mEq/L (ref 96–112)
GFR calc non Af Amer: 51 mL/min — ABNORMAL LOW (ref >60)
Glucose, Bld: 147 mg/dL — ABNORMAL HIGH (ref 70–99)
Potassium: 4.3 mEq/L (ref 3.5–5.1)
Sodium: 137 mEq/L (ref 135–145)

## 2010-12-29 LAB — POCT I-STAT 3, ART BLOOD GAS (G3+)
Bicarbonate: 20.6 mEq/L (ref 20.0–24.0)
TCO2: 22 mmol/L (ref 0–100)
pCO2 arterial: 35.3 mmHg (ref 35.0–45.0)
pH, Arterial: 7.374 (ref 7.350–7.400)

## 2010-12-29 LAB — CBC
HCT: 32.8 % — ABNORMAL LOW (ref 36.0–46.0)
Hemoglobin: 11.4 g/dL — ABNORMAL LOW (ref 12.0–15.0)
RDW: 12.6 % (ref 11.5–15.5)

## 2010-12-29 LAB — POCT I-STAT 3, VENOUS BLOOD GAS (G3P V)
Acid-base deficit: 5 mmol/L — ABNORMAL HIGH (ref 0.0–2.0)
Acid-base deficit: 5 mmol/L — ABNORMAL HIGH (ref 0.0–2.0)
Bicarbonate: 21.1 mEq/L (ref 20.0–24.0)
TCO2: 22 mmol/L (ref 0–100)

## 2011-01-08 LAB — BASIC METABOLIC PANEL
CO2: 26 mEq/L (ref 19–32)
Calcium: 7.9 mg/dL — ABNORMAL LOW (ref 8.4–10.5)
Calcium: 8.1 mg/dL — ABNORMAL LOW (ref 8.4–10.5)
Creatinine, Ser: 0.7 mg/dL (ref 0.4–1.2)
GFR calc Af Amer: >60 mL/min (ref >60)
GFR calc Af Amer: >60 mL/min (ref >60)
GFR calc non Af Amer: >60 mL/min (ref >60)
Sodium: 139 mEq/L (ref 135–145)

## 2011-01-08 LAB — CBC
MCHC: 33.6 g/dL (ref 30.0–36.0)
MCV: 84.7 fL (ref 78.0–100.0)
Platelets: 148 10*3/uL — ABNORMAL LOW (ref 150–400)
Platelets: 151 10*3/uL (ref 150–400)
RBC: 2.98 MIL/uL — ABNORMAL LOW (ref 3.87–5.11)
WBC: 6.9 10*3/uL (ref 4.0–10.5)
WBC: 8.8 10*3/uL (ref 4.0–10.5)

## 2011-01-08 LAB — GLUCOSE, CAPILLARY
Glucose-Capillary: 106 mg/dL — ABNORMAL HIGH (ref 70–99)
Glucose-Capillary: 126 mg/dL — ABNORMAL HIGH (ref 70–99)
Glucose-Capillary: 137 mg/dL — ABNORMAL HIGH (ref 70–99)
Glucose-Capillary: 142 mg/dL — ABNORMAL HIGH (ref 70–99)
Glucose-Capillary: 153 mg/dL — ABNORMAL HIGH (ref 70–99)
Glucose-Capillary: 165 mg/dL — ABNORMAL HIGH (ref 70–99)
Glucose-Capillary: 168 mg/dL — ABNORMAL HIGH (ref 70–99)
Glucose-Capillary: 169 mg/dL — ABNORMAL HIGH (ref 70–99)
Glucose-Capillary: 207 mg/dL — ABNORMAL HIGH (ref 70–99)
Glucose-Capillary: 47 mg/dL — ABNORMAL LOW (ref 70–99)
Glucose-Capillary: 58 mg/dL — ABNORMAL LOW (ref 70–99)
Glucose-Capillary: 77 mg/dL (ref 70–99)
Glucose-Capillary: 81 mg/dL (ref 70–99)
Glucose-Capillary: 82 mg/dL (ref 70–99)
Glucose-Capillary: 87 mg/dL (ref 70–99)
Glucose-Capillary: 96 mg/dL (ref 70–99)

## 2011-01-13 LAB — CARDIAC PANEL(CRET KIN+CKTOT+MB+TROPI)
CK, MB: 1 ng/mL (ref 0.3–4.0)
Relative Index: INVALID (ref 0.0–2.5)
Total CK: 42 U/L (ref 7–177)

## 2011-01-13 LAB — CBC
Hemoglobin: 10.8 g/dL — ABNORMAL LOW (ref 12.0–15.0)
Hemoglobin: 11.6 g/dL — ABNORMAL LOW (ref 12.0–15.0)
Hemoglobin: 5.7 g/dL — CL (ref 12.0–15.0)
Hemoglobin: 6.1 g/dL — CL (ref 12.0–15.0)
MCHC: 32.1 g/dL (ref 30.0–36.0)
MCHC: 32.2 g/dL (ref 30.0–36.0)
MCV: 74.8 fL — ABNORMAL LOW (ref 78.0–100.0)
MCV: 74.9 fL — ABNORMAL LOW (ref 78.0–100.0)
MCV: 82.6 fL (ref 78.0–100.0)
MCV: 83.7 fL (ref 78.0–100.0)
MCV: 84.5 fL (ref 78.0–100.0)
Platelets: 177 10*3/uL (ref 150–400)
Platelets: 178 10*3/uL (ref 150–400)
Platelets: 224 10*3/uL (ref 150–400)
RBC: 2.37 MIL/uL — ABNORMAL LOW (ref 3.87–5.11)
RBC: 2.52 MIL/uL — ABNORMAL LOW (ref 3.87–5.11)
RBC: 3.97 MIL/uL (ref 3.87–5.11)
RDW: 20.4 % — ABNORMAL HIGH (ref 11.5–15.5)
RDW: 20.9 % — ABNORMAL HIGH (ref 11.5–15.5)
RDW: 20 % — ABNORMAL HIGH (ref 11.5–15.5)
WBC: 10.7 10*3/uL — ABNORMAL HIGH (ref 4.0–10.5)
WBC: 11.6 10*3/uL — ABNORMAL HIGH (ref 4.0–10.5)
WBC: 15.9 10*3/uL — ABNORMAL HIGH (ref 4.0–10.5)
WBC: 16.2 10*3/uL — ABNORMAL HIGH (ref 4.0–10.5)
WBC: 7.2 10*3/uL (ref 4.0–10.5)

## 2011-01-13 LAB — HEMOGLOBIN A1C: Hgb A1c MFr Bld: 6.1 % (ref 4.6–6.1)

## 2011-01-13 LAB — COMPREHENSIVE METABOLIC PANEL
ALT: 16 U/L (ref 0–35)
AST: 18 U/L (ref 0–37)
CO2: 21 mEq/L (ref 19–32)
Chloride: 105 mEq/L (ref 96–112)
Creatinine, Ser: 0.98 mg/dL (ref 0.4–1.2)
GFR calc Af Amer: >60 mL/min (ref >60)
GFR calc non Af Amer: 56 mL/min — ABNORMAL LOW (ref >60)
Glucose, Bld: 124 mg/dL — ABNORMAL HIGH (ref 70–99)
Sodium: 135 mEq/L (ref 135–145)
Total Bilirubin: 0.6 mg/dL (ref 0.3–1.2)

## 2011-01-13 LAB — DIFFERENTIAL
Basophils Absolute: 0 10*3/uL (ref 0.0–0.1)
Basophils Absolute: 0 10*3/uL (ref 0.0–0.1)
Basophils Relative: 0 % (ref 0–1)
Basophils Relative: 0 % (ref 0–1)
Eosinophils Absolute: 0.1 10*3/uL (ref 0.0–0.7)
Eosinophils Absolute: 0.1 10*3/uL (ref 0.0–0.7)
Eosinophils Relative: 1 % (ref 0–5)
Eosinophils Relative: 1 % (ref 0–5)
Lymphs Abs: 1.5 10*3/uL (ref 0.7–4.0)
Monocytes Absolute: 0.8 10*3/uL (ref 0.1–1.0)
Monocytes Relative: 5 % (ref 3–12)
Neutrophils Relative %: 80 % — ABNORMAL HIGH (ref 43–77)
Neutrophils Relative %: 82 % — ABNORMAL HIGH (ref 43–77)
Neutrophils Relative %: 70 % (ref 43–77)

## 2011-01-13 LAB — BASIC METABOLIC PANEL
BUN: 26 mg/dL — ABNORMAL HIGH (ref 6–23)
CO2: 19 mEq/L (ref 19–32)
CO2: 24 mEq/L (ref 19–32)
CO2: 29 mEq/L (ref 19–32)
CO2: 28 mEq/L (ref 19–32)
Calcium: 8.9 mg/dL (ref 8.4–10.5)
Calcium: 8.9 mg/dL (ref 8.4–10.5)
Chloride: 103 mEq/L (ref 96–112)
Chloride: 105 mEq/L (ref 96–112)
Chloride: 106 mEq/L (ref 96–112)
Creatinine, Ser: 0.83 mg/dL (ref 0.4–1.2)
GFR calc Af Amer: >60 mL/min (ref >60)
GFR calc Af Amer: >60 mL/min (ref >60)
GFR calc Af Amer: >60 mL/min (ref >60)
GFR calc Af Amer: >60 mL/min (ref >60)
GFR calc non Af Amer: >60 mL/min (ref >60)
Glucose, Bld: 106 mg/dL — ABNORMAL HIGH (ref 70–99)
Potassium: 4.1 mEq/L (ref 3.5–5.1)
Sodium: 136 mEq/L (ref 135–145)
Sodium: 138 mEq/L (ref 135–145)
Sodium: 138 mEq/L (ref 135–145)

## 2011-01-13 LAB — CROSSMATCH: Antibody Screen: NEGATIVE

## 2011-01-13 LAB — GLUCOSE, CAPILLARY
Glucose-Capillary: 107 mg/dL — ABNORMAL HIGH (ref 70–99)
Glucose-Capillary: 108 mg/dL — ABNORMAL HIGH (ref 70–99)
Glucose-Capillary: 110 mg/dL — ABNORMAL HIGH (ref 70–99)
Glucose-Capillary: 113 mg/dL — ABNORMAL HIGH (ref 70–99)
Glucose-Capillary: 115 mg/dL — ABNORMAL HIGH (ref 70–99)
Glucose-Capillary: 150 mg/dL — ABNORMAL HIGH (ref 70–99)
Glucose-Capillary: 190 mg/dL — ABNORMAL HIGH (ref 70–99)
Glucose-Capillary: 54 mg/dL — ABNORMAL LOW (ref 70–99)
Glucose-Capillary: 64 mg/dL — ABNORMAL LOW (ref 70–99)
Glucose-Capillary: 83 mg/dL (ref 70–99)
Glucose-Capillary: 89 mg/dL (ref 70–99)
Glucose-Capillary: 93 mg/dL (ref 70–99)
Glucose-Capillary: 98 mg/dL (ref 70–99)

## 2011-01-13 LAB — HEMOGLOBIN AND HEMATOCRIT, BLOOD
HCT: 24.8 % — ABNORMAL LOW (ref 36.0–46.0)
HCT: 30.9 % — ABNORMAL LOW (ref 36.0–46.0)
Hemoglobin: 10.3 g/dL — ABNORMAL LOW (ref 12.0–15.0)
Hemoglobin: 10.3 g/dL — ABNORMAL LOW (ref 12.0–15.0)
Hemoglobin: 8.1 g/dL — ABNORMAL LOW (ref 12.0–15.0)

## 2011-01-13 LAB — CK TOTAL AND CKMB (NOT AT ARMC)
Relative Index: INVALID (ref 0.0–2.5)
Total CK: 40 U/L (ref 7–177)

## 2011-01-13 LAB — POCT CARDIAC MARKERS
CKMB, poc: <1 ng/mL — ABNORMAL LOW (ref 1.0–8.0)
Myoglobin, poc: 66.7 ng/mL (ref 12–200)
Troponin i, poc: <0.05 ng/mL (ref 0.00–0.09)

## 2011-01-14 ENCOUNTER — Other Ambulatory Visit: Payer: Self-pay

## 2011-01-19 ENCOUNTER — Encounter: Payer: Self-pay | Admitting: Family Medicine

## 2011-01-20 ENCOUNTER — Other Ambulatory Visit: Payer: Self-pay | Admitting: Gastroenterology

## 2011-01-20 ENCOUNTER — Other Ambulatory Visit: Payer: Self-pay

## 2011-01-20 DIAGNOSIS — K589 Irritable bowel syndrome without diarrhea: Secondary | ICD-10-CM

## 2011-01-20 MED ORDER — CHOLESTYRAMINE LIGHT 4 GM/DOSE PO POWD: 4.0000 g | Freq: Two times a day (BID) | ORAL | Status: DC

## 2011-01-20 NOTE — Telephone Encounter (Signed)
rx sent pt aware 

## 2011-01-23 NOTE — Telephone Encounter (Signed)
Rx sent by Christian Mate, CMA. Pt aware.

## 2011-02-05 ENCOUNTER — Other Ambulatory Visit: Payer: Self-pay | Admitting: Family Medicine

## 2011-02-10 NOTE — Assessment & Plan Note (Signed)
[place]                            CARDIOLOGY OFFICE NOTE   DEVINE, BOBROWSKI                      MRN:          [id number]  DATE:05/02/2007                            DOB:          11-27-1940    REFERRING PHYSICIAN:  John L. Rendall, M.D.   REASON FOR CONSULTATION:  Preoperative cardiac risk stratification.   PRIMARY CARE PHYSICIAN:  Dr. Sarajane Jews.   HISTORY OF PRESENT ILLNESS:  Ms. Wormington is a delightful 70 year old  woman with a history of coronary artery disease, status post bare metal  stenting to the proximal LAD in April of 2000, stenting with mild  residual coronary disease and a normal ejection fraction. She also has a  history of diabetes, hypertension, hyperlipidemia, and gastroesophageal  reflux disease. She has recently developed problems with left shoulder  pain and say Dr. Telford Nab and is now due for a left shoulder arthroscopic  surgery.   She tells me she has done very well. She denies any chest pain or  shortness of breath. Unfortunately she has given up her walking program  and is not doing as much as she used to. She has not had any problems  with orthopnea or PND. She has occasional minimally swelling around her  ankles which resolves overnight. She has been taking all her  medications. After her stent she was on Plavix for a few months, and  then has discontinued it, and is on longer on it.   REVIEW OF SYSTEMS:  Notable for some arthritis pain. She denies any  nausea, vomiting, abdominal pain, bright red blood per rectum, or  melena. Her remaining review of systems is negative except for HPI and  problem list.   PROBLEM LIST:  1. Coronary artery disease.      a.     Status post bare metal stent to the proximal LAD in April of       2007 with mild residual coronary artery disease with normal       ejection fraction.  2. Diabetes.  3. Hypertension.  4. Hyperlipidemia.      a.     Most recent cholesterol March 2008.  Total cholesterol of       136, triglycerides 225, HDL 39, LDL 72.  5. Gastroesophageal reflux disease.  6. Anxiety/depression.   CURRENT MEDICATIONS:  1. Cartia XT 240 a day.  2. Cymbalta 60 a day.  3. Glucovance 2.5/500 t.i.d.  4. Omeprazole 40 mg a day.  5. Potassium 20 b.i.d.  6. Relafen 750 b.i.d. .  7. Aspirin 81.  8. Multivitamin.  9. Iron.  10.Coreg CR 20.  11.Lipitor 80.  12.Januvia 100.  13.Lisinopril/hydrochlorothiazide 20/12.5 b.i.d.   ALLERGIES:  AUGMENTIN WHICH APPARENTLY CAUSES ANGIOEDEMA.   SOCIAL HISTORY:  She is divorced. She lives in [place]. 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  She is  about to have her first grandchild. She denies any alcohol or tobacco  use.   FAMILY HISTORY:  Mother died at 38 from a heart attack. Father died at  55 from a heart attack  and sepsis. Sister has a history of a rapid heart  beat. Her brother is alive and well.   PHYSICAL EXAMINATION:  She is well-appearing, no acute distress.  Ambulates around the clinic without any respiratory difficultly. Blood  pressure is 108/60, heart rate is 77, weight is 179.  HEENT: Normal.  NECK: Supple. No JVD. Carotids are 2 + bilaterally without any bruits.  There is no lymphadenopathy or thyromegaly.  CARDIAC: PMI is nondisplaced. She has a regular rate and rhythm. No  murmurs, rubs, or gallops.  LUNGS: Clear.  ABDOMEN: Soft, nontender, nondistended. No hepatosplenomegaly. No  bruits. No masses. Good bowel sounds.  EXTREMITIES:  Warm with no cyanosis, clubbing, or edema, or rash. There  is no ulceration. Good distal pulses.  NEURO: Alert and oriented x3. Cranial nerves II-XII are intact. Moves  all 4 extremities without difficultly. Affect is pleasant.   EKG: Shows normal sinus rhythm at a rate of 77 with poor R wave  progression. No significant ST-T wave abnormalities.   ASSESSMENT/PLAN:  1. Preoperative cardiovascular risk stratification. Given  her recent      re-vascularization without any recurrent symptoms, she is at quite      low risk for perioperative cardiac complications especially in the      setting of arthroscopic surgery. I told her that she can proceed to      surgery without any further cardiac work up.  2. Hypertension, well controlled.  3. Hyperlipidemia, LDL is just about at goal, HDL is low. We will      recheck lipids now and I suspect we may need to consider some      Niaspan in the near future.   She will return to clinic in several months.     Shaune Pascal. Bensimhon, MD  Electronically Signed    DRB/MedQ  DD: 05/02/2007  DT: 05/02/2007  Job #: [id number]   cc:   Annie Main A. Sarajane Jews, [place] Rendall, M.D.

## 2011-02-10 NOTE — Discharge Summary (Signed)
[name], [name]               ACCOUNT NO.:  [id number]   MEDICAL RECORD NO.:  [id number]          PATIENT TYPE:  INP   LOCATION:  [id number]                         FACILITY:  [facility]   PHYSICIAN:  Mare Loan, MD      DATE OF BIRTH:  Nov 30, 1940   DATE OF ADMISSION:  11/28/2008  DATE OF DISCHARGE:  12/03/2008                               DISCHARGE SUMMARY   FINAL DIAGNOSES:  Adenocarcinoma of the right colon.   PROCEDURE:  November 28, 2008 hand assisted laparoscopic right colon  resection.   HOSPITAL COURSE:  Tonya [name] is a pleasant 70 year old female who  underwent hand assisted laparoscopic right colon surgery on November 28, 2008.  Postoperatively she had no significant complaints.  She did have  some loose stools which began to thicken after stopping Entereg. She had  some mildly hypoglycemic episodes while on postoperative course.  She  was held on her hyperglycemic, and this resolved without any issues.  She was able to ambulate without difficulty, was having bowel movements  and had no drainage or erythema at her incision sites.  She was  discharged home on Vicodin for pain.  She has continued all of her home  medications.  She is instructed to follow up with Dr. Diona Browner regarding  her diabetes.  She will follow up with me in approximately 2-3 weeks'  time.  Her final pathology report revealed a multifocal invasive  adenocarcinoma 4.85x2.1, 43 lymph nodes were negative for metastases.  The margins were negative.  She is on a regular diet and is instructed  to take stool softeners if she still has constipation.      Mare Loan, MD  Electronically Signed     ALA/MEDQ  D:  01/18/2009  T:  01/18/2009  Job:  [id number]   cc:   [name], MD   Ludwig Lean, MD

## 2011-02-10 NOTE — Assessment & Plan Note (Signed)
[facility]                            CARDIOLOGY OFFICE NOTE   Tonya [name], Tonya [name]                      MRN:          [id number]  DATE:11/16/2007                            DOB:          1941-07-06    PRIMARY CARE PHYSICIAN:  Tonya [name]. Tonya Jews, MD   INTERVAL HISTORY:  [name] is a delightful 70 year old woman with a  history of coronary artery disease status post bare metal stenting to  the proximal LAD in 2007.  There is some mild residual coronary artery  disease elsewhere with normal ejection fraction.  She also history of  diabetes, hypertension, hyperlipidemia and gastroesophageal reflux  disease.  She recently underwent endoscopic surgery of her left  shoulder.   Overall, she is doing well.  She continues to have some pain in her  shoulder and her shoulder blade, but no angina, no shortness of breath.  She remains active taking care of her 18-month-old granddaughter.  No  heart failure symptoms. She has been compliant with her medications.   CURRENT MEDICATIONS:  1. Cardia XT 240 a day.  2. Glucovance 2.5/500 t.i.d.  3. Omeprazole 40 a day.  4. Potassium 20 b.i.d.  5. Relafen 750 b.i.d.  6. Aspirin 81 a day.  7. Calcium.  8. Multivitamin.  9. Iron.  10.Lipitor 80 a day.  11.Lisinopril/hydrochlorothiazide 20/12.5 b.i.d.  12.Carvedilol 12.5 b.i.d.  13.Januvia.   PHYSICAL EXAM:  She is well-appearing in no acute distress. Ambulates  around the clinic without respiratory difficulty.  Blood pressure is  116/56, heart rate 67 weights 179.  HEENT: Normal. .  NECK: is supple. No JVD. Carotids are 2+ bilaterally without bruits.  There is no lymphadenopathy or thyromegaly.  CARDIAC: PMI Is nondisplaced. She has a regular rate and a rhythm. No  murmurs, rubs or gallops.  LUNGS:  Are clear.  ABDOMEN: Soft, nontender, nondistended. No hepatosplenomegaly. No  bruits. No masses. Good bowel sounds.  EXTREMITIES: Are warm with no cyanosis,  clubbing or edema. No rash or  ulceration. Good distal pulses.  NEURO: She is alert and oriented x3. Cranial nerves II-XII are intact.  Moves all 4 extremities without difficulty. Affect is pleasant.   ASSESSMENT/PLAN:  1. Coronary artery disease.  She is doing well.  No evidence of      ischemia.  Continue current therapy.  2. Hypertension, well-controlled.  3. Hyperlipidemia, this is followed by Dr. Sarajane [name]. Continue Lipitor,      goal LDL is less than 70.  She did have some problems with low HDL      and could consider Niaspan in the future as needed.   DISPOSITION:  Will see her back in 9 months for routine follow-up.     Shaune Pascal. Bensimhon, MD  Electronically Signed    DRB/MedQ  DD: 11/16/2007  DT: 11/17/2007  Job #: [id number]   cc:   Tonya Main A. Tonya Jews, MD

## 2011-02-10 NOTE — Assessment & Plan Note (Signed)
[place]                            CARDIOLOGY OFFICE NOTE   Tonya [name], Tonya [name]                      MRN:          [id number]  DATE:08/27/2008                            DOB:          01-18-1941    PRIMARY CARE PHYSICIAN:  [name], MD   INTERVAL HISTORY:  Ms. Trabue is a delightful 70 year old with a  history of coronary artery disease status post bare-metal stenting of  the proximal LAD in 2007.  She had some mild residual coronary artery  disease elsewhere with normal ejection fraction.  She also has a history  of diabetes, hypertension, hyperlipidemia, and gastroesophageal reflux  disease.   She returns today for routine followup.  From a cardiac point of view,  she is doing great.  She often keeps her 54-year-old granddaughter, is  able to keep up with her without any chest pain or dyspnea.  Unfortunately, yesterday she developed recurrent diarrhea, nausea, and  vomiting.  She has been somewhat weak since that time, but feels  somewhat better today.  She has had intermittent trouble with diarrhea  in the past.   CURRENT MEDICATIONS:  1. Cardia XT 240 a day.  2. Glucovance 2.5/500 q.i.d.  3. Omeprazole 40 a day.  4. Potassium 20 b.i.d.  5. Relafen 750 b.i.d.  6. Aspirin 81 a day.  7. Multivitamin.  8. Calcium.  9. Lipitor 80 a day.  10.Januvia 100 a day.  11.Lisinopril/HCTZ 20/12.5 b.i.d.  12.Coreg 12.5 b.i.d.  13.Celexa 40 a day.   PHYSICAL EXAMINATION:  GENERAL:  She is well-appearing in no acute  distress, ambulates around the clinic without any respiratory  difficulty.  VITAL SIGNS:  Blood pressure is 98/44, heart rate 63, weight is 170.  HEENT:  Normal.  NECK:  Supple.  There is no JVD.  Carotids are 2+ bilaterally without  any bruits.  There is no lymphadenopathy or thyromegaly.  CARDIAC:  PMI is nondisplaced.  Regular rate and rhythm with a very soft  systolic ejection murmur at the right sternal border.  ST is  well  preserved.  LUNGS:  Clear.  ABDOMEN:  Obese, nontender, nondistended.  No hepatosplenomegaly, no  bruits, no masses.  Good bowel sounds.  EXTREMITIES:  Warm with no  cyanosis, clubbing, or edema.  No rash.  NEURO:  Alert and oriented x3.  Cranial nerves II through XII are  intact.  Moves all 4 extremities without difficulty.  Affect is  pleasant.   EKG, normal sinus rhythm, rate of 63.  No ST-T wave abnormalities.   ASSESSMENT AND PLAN:  1. Coronary artery disease.  She is doing well.  No evidence of      ischemia.  Continue current therapy.  2. Hypertension.  Her blood pressure is actually a little bit on the      low side today.  I suspect she is volume depleted.  I told her      during her bouts of diarrhea she should probably go ahead and hold      her lisinopril and hydrochlorothiazide so as not to get a diuretic  effect.  I encouraged her to drink some fluids and have some salt.      If her diarrhea continues, I would have her follow up with GI.  3. Hyperlipidemia.  As followed by Dr. Diona Browner.  Goal LDL is less than      70.   DISPOSITION:  Will see her back in 9 months for routine followup.     Shaune Pascal. Bensimhon, MD  Electronically Signed    DRB/MedQ  DD: 08/27/2008  DT: 08/28/2008  Job #: [id number]   cc:   [name], MD

## 2011-02-10 NOTE — Op Note (Signed)
NAME:  Tonya [name], Tonya [name] NO.:  [id number]   MEDICAL RECORD NO.:  [id number]          PATIENT TYPE:  INP   LOCATION:  31                         FACILITY:  [facility]   PHYSICIAN:  Mare Loan, MD      DATE OF BIRTH:  [birth year]   DATE OF PROCEDURE:  11/28/2008  DATE OF DISCHARGE:                               OPERATIVE REPORT   PREOPERATIVE DIAGNOSIS:  Right colon cancer.   POSTOPERATIVE DIAGNOSES:  Right colon cancer.   PROCEDURE:  Laparoscopic hand-assisted right colectomy.   SURGEON:  Mare Loan, M.D.   ASSISTANTS:  Jeanella Anton, M.D.   SPECIMEN:  Right colon distal ileum.   BLOOD LOSS:  75 mL.   COMPLICATIONS:  No immediate complications.   DRAINS:  No drains were placed.   INDICATIONS FOR PROCEDURE:  Tonya [name] is a 70 year old female who was  found to have anemia.  On screening colonoscopy a mass was found at the  cecum.  Biopsy was consistent with adenocarcinoma.  I discussed with the  patient preoperatively performing a laparoscopic hand-assisted, possible  open procedure.  Informed consent was obtained after explaining the  risks of the surgery, including but not limited to bleeding, infection,  anastomotic leak, need for future surgery and possible cardiac  complications.   DETAILS OF PROCEDURE:  Tonya [name] was identified in preoperative  holding area.  She was given 2 grams of cefoxitin and was taken to the  operating room.  Once in the operating room, she was placed in supine  position.  After administration of general endotracheal anesthesia a  Foley catheter was placed.  Her abdomen was then prepped and draped in  the usual sterile fashion.  A time-out procedure, indicating patient and  procedure was performed.   I placed an incision above the umbilicus.  Using the OptiVu, I placed a  5-mm trocar into the abdominal cavity.  All layers of abdominal wall  were visualized upon entry.  After adequate pneumoperitoneum, I placed  the  camera into the abdominal cavity.  I inspected the abdomen and found  no evidence of injury upon placement of the trocar.  There were omental  adhesions superiorly.  The patient had had a previous open  cholecystectomy years ago.  I then opened the incision and placed the  hand port in the upper abdomen.  I then placed a 5-mm trocar in the  midline, just beneath the umbilicus.  An additional 5-mm trocar was  placed in the left side of the abdomen for visualization with camera.  I  grasped the colon and retracted medially and dissected the lateral  attachments with the Enseal device.  I continued dissecting inferiorly  to fully mobilize the ileum.  I then proceeded superiorly and was able  to mobilize the hepatic flexure.  I separated the adhesions to the  abdominal wall with the Enseal device, as well.  I continued mobilizing  the hepatic flexure.   Once I had had the adhesions from the previous cholecystectomy with the  Enseal device, I was able to fully mobilize the cecum and distal  ileum  up to the incision at the midline.  I then released pneumoperitoneum.  I  brought the specimen up through the wound protector.  The omentum was  freed more away from the colon to perform the anastomosis.  We picked a  place to the left of the middle colic vessels and dissected the  mesentery at the ileocolic vessel.  There were palpable lymph nodes and  we placed a Kelly clamp distal to this.  Using a 2-0 silk tie, we tied  off the larger vessels.  There were palpable nodes along the small  bowel.  We got underneath these with the [facility] clamp.  We dissected  approximately 15 cm distal ileum.  I then performed a side-to-side  anastomosis, using a 75 GIA stapler.  I placed a 2-0 silk suture at the  distal staple line to take tension off the staple line.  Prior to  performing the TA-60 I inspected the staple line inside the bowel and  found no evidence of bleeding.  I then placed the TA-60 to close the   enterotomy site.  Specimen was handed off the operative field.   We inspected the staple line.  There was a small opening at one end.  I  closed this using interrupted 3-0 silk sutures.  I then reinforced this  suture line and overlapped the tissues over the staple line using the  silk sutures.  I did this along the entire staple line, although the  other portion seemed intact.  I then irrigated the abdomen with 2 liters  of saline.  We changed our gloves and I re-insufflated the abdomen,  placed the hand port back in place.  I inspected the abdomen and found  no evidence of bleeding.  I did irrigate with an additional 2 liters of  saline.  The mesenteric resection appeared intact without any bleeding.  Staple line was intact.  I placed omentum over the staple line, released  the pneumoperitoneum and closed the fascial defect using a running 0 PDS  suture.  Skin was then closed with Monocryl after removal of the ports.  Dermabond was placed as final dressing.  I did anesthetize the skin and  the fascia with approximately 60 mL of Marcaine.  The patient was then  extubated, transferred to postanesthesia care unit in stable condition.  She will be kept on ice chips tonight, be placed on Entereg protocol and  will be monitored for adequate pain control postoperatively.      Mare Loan, MD  Electronically Signed     ALA/MEDQ  D:  11/28/2008  T:  11/28/2008  Job:  [id number]   cc:   [name], MD   Milus Banister, MD  [address]  [place], [place] [address]

## 2011-02-10 NOTE — Discharge Summary (Signed)
Tonya [name], Tonya [name]               ACCOUNT NO.:  [id number]   MEDICAL RECORD NO.:  [id number]          PATIENT TYPE:  INP   LOCATION:  [id number]                         FACILITY:  [facility]   PHYSICIAN:  Biagio Borg, MD      DATE OF BIRTH:  01-Jun-1941   DATE OF ADMISSION:  10/31/2008  DATE OF DISCHARGE:  11/04/2008                               DISCHARGE SUMMARY   DISCHARGE DIAGNOSES:  1. Cecal mass, malignant pathology pending.  2. Iron deficiency anemia.  3. Paroxysmal atrial fibrillation.  4. Diabetes mellitus.  5. Hypertension.  6. Diverticulosis.  7. Syncope episodes.   CONSULTATIONS:  Gastroenterology.   GENERAL SURGERY PROCEDURES:  1. Colonoscopy with biopsy January 6, 2010with a 4 cm sessile polyp in      the ascending colon as well as a small firm malignant-appearing      mass in the cecum and diverticulosis.  2. EGD November 01, 2008 with mild gastritis, otherwise within normal      limits.   HISTORY AND PHYSICAL:  See that dictated on the date of admission.   HOSPITAL COURSE:  Ms. Gulden is 70 year old white female patient of Dr.  Rometta Emery who was admitted to Dr. Alain Marion on the date of admission  with near syncope and severe dizziness, severe anemia, and GI bleed,  questionable upper versus lower, as well as her chronic problems  including diabetes, paroxysmal atrial fibrillation, osteoarthritis,  hyperlipidemia, hypertension and GERD.  She had been using NSAID before  the EGD on February 4.  This was felt not to be the etiology of  bleeding.  She was otherwise treated in the usual fashion with IV  fluids, blood transfusion.  Iron indices proved consistent with low  iron.  Colonoscopy January 6 showed results as above which was felt to  be that the etiology of the iron-deficiency anemia.  Prior to discharge  she was seen per general surgery who agreed that she would likely need  to have at least a cecal resection and possible right hemicolectomy.  Altogether since  she was stable and no other problems, this was felt to  be able to be accomplished outpatient.  Discharge hemoglobin was 9.9,  hemoglobin A1c 6.1.  CEA level 1.2.   DISPOSITION:  Discharged to home in good condition.  No activity or  dietary restrictions except for heart-healthy/diabetic diet as before.  She is to follow up with general surgery Friday, February 11.  The  surgeon will make arrangement for the patient to be preoperatively  evaluated and cleared for cardiology as well.  The patient is advised to  see Dr. Diona Browner in 1 to 2 weeks. At the time of discharge she is advised  to discontinue Relafen.   DISCHARGE MEDICATIONS:  1. Glucovance 2.5 mg/500 mg 2 p.o. b.i.d.  2. Lipitor 80 mg p.o. daily.  3. Cartia XT 240 mg p.o. daily.  4. Omeprazole 20 mg 2 p.o. daily.  5. KCL 20 mEq p.o. daily.  6. Darvocet p.r.n.  7. Nitroglycerin p.r.n.  8. Aspirin 81 mg 1 p.o. daily until asked to discontinue prior to  surgery.  9. Os-Cal.  10.Fish oil.  11.Multivitamin as directed.  12.Celexa 40 mg p.o. daily.  13.Lisinopril hydrochlorothiazide 20/12.5 two p.o. daily.      Biagio Borg, MD  Electronically Signed     JWJ/MEDQ  D:  11/04/2008  T:  11/04/2008  Job:  [id number]

## 2011-02-13 NOTE — Assessment & Plan Note (Signed)
[facility]                            CARDIOLOGY OFFICE NOTE   CLAUDIE, PAJARES                      MRN:          [id number]  DATE:09/06/2006                            DOB:          April 18, 1941    PRIMARY CARE PHYSICIAN:  Dr. Alysia Penna.   PATIENT IDENTIFICATION:  The patient is a delightful 70 year old woman  who returns for routine followup.   PROBLEM LIST:  1. Coronary artery disease.      a.     Status post bare metal stent to the proximal left anterior       descending in April 2007 with mild residual coronary artery       disease with normal ejection fraction.  2. Diabetes.  3. Hypertension.  4. Hyperlipidemia.      a.     Most recent cholesterol October 2007, total cholesterol 134,       triglycerides 137, HDL 36, LDL 71.  5. Gastroesophageal reflux disease.  6. Anxiety/depression.   CURRENT MEDICATIONS:  1. Actos 30 a day.  2. Cartia 240 a day.  3. Cymbalta.  4. Glucovance 250/500 in the a.m.  5. Omeprazole 40 a day.  6. Potassium 20 b.i.d.  7. Relafen 750 b.i.d.  8. Benicar 20/12.5 b.i.d.  9. Aspirin 325.  10.Multivitamin.  11.Iron.  12.Coreg CR 20 a day.  13.Lipitor 80 a day.   INTERVAL HISTORY:  Ms. Chadwick returns today for routine followup.  Overall, she is doing very well.  Unfortunately, she has not been able  to continue her walking program due to weather, but she did recently  sign up for [employer].  Over the past 2 weeks, she has battled with a  urinary tract infection and nausea and vomiting associated with her  antibiotic.  She feels better now.  She denies chest pain or shortness  of breath.  There is no significant lower extremity edema.  She stopper  her Plavix after 6 months.   PHYSICAL EXAM:  She is well-appearing, in no acute distress.  Ambulates  around the clinic without respiratory difficulty.  Blood pressure is 122/68, heart rate is 70, her weight is 185.  HEENT:  Sclerae anicteric.  EOMI.  There  are scattered xanthelasma.  Mucous membranes are moist.  NECK:  Supple.  No JVD.  Carotids are 2+ bilaterally without any bruits.  There is no lymphadenopathy or thyromegaly.  CARDIAC:  She has a regular rate and rhythm with no murmur, rub, or  gallop.  LUNGS:  Clear.  ABDOMEN:  Obese.  Nontender.  Nondistended.  No hepatosplenomegaly.  No  bruits.  No masses.  Good bowel sounds.  EXTREMITIES:  Warm with no cyanosis, clubbing, or edema.  NEURO:  Alert and oriented x3.  Cranial nerves 2-12 are intact.  Moves  all 4 extremities without difficulty.  Her affect is very pleasant.   EKG:  Normal sinus rhythm at a rate of 70.  No significant ST-T wave  changes.   ASSESSMENT AND PLAN:  1. Coronary artery disease.  She is doing quite well.  She is on an  excellent medical regimen.  I did tell her that she could drop her      aspirin down to 81 mg a day.  She has completed her course of      Plavix.  2. Hypertension, well-controlled.  3. Hyperlipidemia.  LDL is just at goal.  Continue current therapy.      We will recheck in 3 months.   DISPOSITION:  Return to clinic in 9 months for a routine followup.  Of  note, I did mention to her that Relafen can have side effects on her  kidneys, GI mucosa, and blood pressure, and asked that she try to cut  down on it as much as possible.     Shaune Pascal. Bensimhon, MD  Electronically Signed    DRB/MedQ  DD: 09/06/2006  DT: 09/06/2006  Job #: [id number]   cc:   Annie Main A. Sarajane Jews, MD

## 2011-02-13 NOTE — Cardiovascular Report (Signed)
NAME:  Tonya [name], Tonya [name] NO.:  [id number]   MEDICAL RECORD NO.:  [id number]          PATIENT TYPE:  OIB   LOCATION:  6529                         FACILITY:  [facility]   PHYSICIAN:  Ethelle Lyon, M.D. LHCDATE OF BIRTH:  [birth year]   DATE OF PROCEDURE:  01/22/2006  DATE OF DISCHARGE:                              CARDIAC CATHETERIZATION   PROCEDURE:  Bare metal stenting to the proximal LAD, intravascular  ultrasound of the LAD, StarClose closure of the right common femoral  arteriotomy site.   [name] is a 70 year old lady who presents with approximately 1 month of  chest discomfort occurring with minimal exertion.  This has persisted  despite treatment with calcium channel blocker, beta blocker, and Imdur.  She underwent diagnostic angiography on January 20, 2006 by Dr. Haroldine Laws.  This demonstrated a 95% stenosis of the proximal LAD with modest RCA to LAD  collaterals.  She was initiated on Plavix and returns today for planned  percutaneous intervention on the LAD lesion.   PROCEDURAL TECHNIQUE:  Informed consent was obtained.  Under 1% lidocaine  local anesthesia, a 6-French sheath was placed in the right common femoral  artery using the modified Seldinger technique.  Anticoagulation was  initiated with bivalirudin.  ACT was confirmed to be greater than 225  seconds.  The patient had been maintained on aspirin and Plavix in the days  prior to procedure.   A Prowater wire was advanced to the distal LAD without difficulty.  The  lesion was predilated using a 3.0 x 12 mm Maverick at 6 atmospheres.  I then  performed intravascular ultrasound using [employer] device and  automated pullback.  This demonstrated the distal reference vessel to  measure 3.7 x 4.1 mm.  I then placed a 3.5 x 12 mm Liberte stent positioned  at the ostium of the LAD and extending across the lesion.  I deployed it at  14 atmospheres.  I then postdilated using a 3.75 x 12 mm Quantum  at 16  atmospheres.  I then performed repeat intravascular ultrasound after the  administration of 200 mcg of intracoronary nitroglycerin.  This demonstrated  the stent to be less than fully expanded though it was fully apposed.  I  further postdilated using a 4.0 x 12 mm Quantum at 20 atmospheres.  I then  repeated intravascular ultrasound which now demonstrated much improved  expansion and full apposition.  Both margins of the stent looked excellent  and the stent did not protrude into the left main.  I then performed repeat  intravascular ultrasound using the [employer] device for  comparison between the devices.  I then removed the wire.  Final angiography  demonstrated no residual stenosis, no dissection, and TIMI 3 flow to the  distal vasculature.   The arteriotomy was then closed using a StarClose device.  Complete  hemostasis was obtained.  She was then transferred to holding room in stable  condition having tolerated the procedure well.   COMPLICATIONS:  None.   IMPRESSION/PLAN:  Successful bare metal stenting of the proximal left  anterior descending reducing  stenosis from 95% to 0%.  She should be  maintained on aspirin indefinitely and Plavix for 30 days.      Ethelle Lyon, M.D. [facility]  Electronically Signed     WED/MEDQ  D:  01/22/2006  T:  01/22/2006  Job:  [id number]   cc:   Annie Main A. Sarajane Jews, M.D. [facility]  [address] [place]  [place] [address]   Glori Bickers, M.D. [place]  [place] [address]

## 2011-02-13 NOTE — Assessment & Plan Note (Signed)
[facility] OFFICE NOTE   NAME:Tonya [name], Tasker                      MRN:          [id number]  DATE:09/13/2006                            DOB:          1941/08/11    This is a 70 year old woman here for a complete physical examination.  In general, she is doing well and has no complaints.  I saw her on  November 30 with an episode of pyelonephritis.  She was given a shot of  Rocephin that day, and then a 10-day course of Levaquin.  She has  completed this and feels like she is back to normal.  Otherwise, there  have been no recent health changes.  Of note, she had an unremarkable  colonoscopy in January of 2006.  She also had a normal bone densitometry  evaluation in October of this year.  We have been following her for  hypertension, hyperlipidemia, type 2 diabetes mellitus, GE reflux  disease, anemia, depression, and degenerative joint disease.  For  further details of her past medical history, family history, social  history, habits, etc., I refer you to her last physical note dated  September 18, 2005.   ALLERGIES:  AUGMENTIN.   CURRENT MEDICATIONS:  1. Cymbalta 60 mg per day.  2. Multivitamins daily.  3. Aspirin 81 mg per day.  4. Actos 30 mg per day.  5. Cartia XT 240 mg per day.  6. Iron supplement 65 mg b.i.d.  7. Lipitor 80 mg per day.  8. Benicar HCT  20/12.5 once a day.  9. Omeprazole 20 mg b.i.d.  10.K-Dur 20 mEq b.i.d.  11.Relafen 750 mg b.i.d.  12.Calcium and vitamin D daily.  13.Glucovance 2.5/500 one tablet in the morning and two tablets in the      evening.  14.Coreg CR 20 mg once a day.  15.Xanax 0.25 mg b.i.d. as needed.  16.Darvocet as needed.   OBJECTIVE:  Height 5 feet 6 inches.  Weight 187.  BP 134/64.  Pulse 70  and regular.  GENERAL:  She remains somewhat overweight.  SKIN:  Clear.  EYES:  Clear.  EARS:  Clear.  PHARYNX:  Clear.  NECK:  Supple without lymphadenopathy,  masses.  LUNGS:  Clear.  CARDIAC:  Rate and rhythm, regular without gallops, murmurs, or rubs.  Distal pulses are full.  BREASTS AND AXILLAE:  Clear.  ABDOMEN:  Soft, normal bowel sounds, nontender, no masses.  PELVIC EXAM:  External genitalia within normal limits.  Vagina is clear,  cervix clear, Pap smear is obtained.  Uterus not enlarged.  Adnexa:  No  masses or tenderness.  RECTAL EXAM:  No masses or tenderness.  Stool is hemoccult negative.  EXTREMITIES:  No clubbing, cyanosis, or edema.  NEUROLOGIC EXAM:  Grossly intact.   She was here for fasting labs on December 11.  These were primarily  remarkable for mildly elevated hemoglobin A1c at 7.3, otherwise her  lipid panel is excellent with an LDL of 60, and a hemoglobin of 13.4.   ASSESSMENT AND PLAN:  1. Complete physical.  We talked about getting more  exercise and      losing weight.  The patient will set up her own mammogram.  2. Recent pyelonephritis resolved.  3. Hypertension, stable.  4. Hyperlipidemia, stable.  5. Type 2 diabetes mellitus, stable although we did talk about getting      more exercise.  6. Gastroesophageal reflux disease, stable.  7. Anemia, stable.  8. Degenerative joint disease, stable.  9. Depression, stable.     Tonya [name]. Tonya Jews, MD     SAF/MedQ  DD: 09/14/2006  DT: 09/14/2006  Job #: [id number]

## 2011-02-13 NOTE — Cardiovascular Report (Signed)
NAME:  Tonya [name], Tonya [name] NO.:  [id number]   MEDICAL RECORD NO.:  [id number]          PATIENT TYPE:  OIB   LOCATION:  1961                         FACILITY:  [facility]   PHYSICIAN:  Glori Bickers, M.D. LHCDATE OF BIRTH:  Aug 28, 1941   DATE OF PROCEDURE:  01/20/2006  DATE OF DISCHARGE:                              CARDIAC CATHETERIZATION   PRIMARY CARE PHYSICIAN:  Ishmael Holter. Sarajane Jews, M.D.   CARDIOLOGIST:  Glori Bickers, M.D. [facility]   PATIENT IDENTIFICATION:  Tonya [name] is a delightful 70 year old woman with  a history of diabetes, hypertension, and hyperlipidemia.  About 1 month ago  she began to develop exertional angina.  She has not had any rest angina.  She was referred for a Cardiolite, by her primary care doctor, which showed  an EF of 69% with anterior ischemia.  I saw her in the office and scheduled  her for the catheterization.  She underwent diagnostic catheterization in  the outpatient lab today.   PROCEDURES PERFORMED:  1.  Selective coronary angiography.  2.  Left heart catheterization.  3.  Left ventriculogram.   DESCRIPTION OF THE PROCEDURE:  The risks and benefits of the catheterization  were explained.  Consent was signed and placed on the chart.  A 4-French  arterial sheath was placed in the right femoral artery using a modified  Seldinger technique.  Standard catheters including a JL-4, 3-D RC and angled  pigtail were used for procedure.  All catheter exchanges were made over the  wire.  There were no apparent complications.  During the procedure, the  patient was given 2000 units of IV heparin.   FINDINGS:  1.  Central aortic pressure is 115/64 with a mean of 88.  LV pressure 129/0      with an LVEDP of 2.  There is no aortic stenosis.  2.  Left main: There was a 40% ostial stenosis.  3.  LAD was a long vessel wrapping the apex.  It gave off 3 diagonals.  The      first diagonal is fairly large.  In the very proximal portion of the      LAD,  there was a 95% focal lesion.  There was TIMI 2 flow through the      mid vessel.  The distal vessel did not fill well in the left shot; but      was seen filling well through collaterals from right-to-left.  There is      no other significant disease in the LAD.  There was a 50% ostial lesion      in the first large diagonal.  4.  The left circumflex was made up primarily of a large OM-1 there is no      angiographic CAD.  5.  Right coronary artery was a large dominant vessel.  It gave off a large      acute marginal, a large PDA, and a large posterolateral.  There was      robust right-to-left collaterals filling the distal LAD.   LEFT VENTRICULOGRAM:  Done the RAO position showed an EF of 65%  with no  mitral regurgitation or wall motion abnormalities.   ASSESSMENT:  1.  Severe 1-vessel coronary artery disease with a high-grade proximal LAD      lesion.  2.  Exertional angina without any resting symptoms.  3.  Normal LV function.   PLAN/DISCUSSION:  I have reviewed the films with Dr. Albertine Patricia.  Given her  stable symptoms, we will load her with Plavix and bring her back for a  percutaneous intervention on the LAD on Friday.  I did discuss with her that  should she have significant chest pain she should call 911, and come to the  emergency room immediately.  She does have p.r.n. nitroglycerin; and we also  recently started her on Imdur and Coreg.  After the angioplasty. she will  need aggressive risk factor modification.      Glori Bickers, M.D. [facility]  Electronically Signed     DB/MEDQ  D:  01/20/2006  T:  01/20/2006  Job:  [id number]   cc:   Annie Main A. Sarajane Jews, M.D. [facility]  [address] [place]  [place] [address]   Glori Bickers, M.D. [place]  [place] [address]

## 2011-02-13 NOTE — Op Note (Signed)
. [facility]  Patient:    Tonya [name], Tonya [name]                      MRN: [id number] Proc. Date: 12/23/99 Adm. Date:  [id number] Attending:  Sypher, Houston Siren CC:         Youlanda Mighty. Sypher, Brooke Bonito., M.D. x 2                           Operative Report  PREOPERATIVE DIAGNOSIS:  Entrapment neuropathy median nerve left carpal tunnel.   POSTOPERATIVE DIAGNOSIS:  Entrapment neuropathy median nerve left carpal tunnel.  PROCEDURE:  Release of left transverse carpal ligament.  SURGEON:  Youlanda Mighty. Sypher, Brooke Bonito., M.D.  ASSISTANT:  Judith Part. Chabon, P.A.  ANESTHESIA:  General by mask.  SUPERVISING ANESTHESIOLOGIST:  Dr. Tobias Alexander.  INDICATIONS:  [name] is a 70 year old woman, who has had discomfort and numbness in her left hand.  Clinical examination suggested carpal tunnel syndrome. Electrodiagnostic studies revealed median neuropathy at the wrist.  After informed consent, she was brought to the operating room at this time for release of her eft transverse carpal ligament.  DESCRIPTION OF PROCEDURE:  Kathyjo Soles was brought to the operating room and placed supine position on the operating room table.  Following induction of general anesthesia, the left arm was prepped with Betadine soap and solution and sterilely draped.  Following exsanguination of the limb with an Esmarch bandage, the arterial tourniquet was inflated to 220 mmHg.  The procedure commenced with a short incision in the line of the ring finger in the palm.  Subcutaneous tissues were carefully divided revealing the palmar fascia.  This was split longitudinally to reveal the common sensory branches of the median nerve.  These were followed back to the transverse carpal ligament, which was carefully isolated from the median nerve.  The ligament was released on its ulnar border extending into the distal forearm. This widely opened the carpal canal.  No masses or other  predicaments were noted.  The wound was inspected for bleeding point, which are electrocauterized along the margins of the release of the transverse carpal ligament.  The wound was subsequently repaired with intradermal 3-0 Prolene suture.  A compressive dressing was applied with a volar forearm splint maintaining the wrist in 5 degrees of dorsiflexion. DD:  12/23/99 TD:  12/23/99 Job: [id number]

## 2011-02-13 NOTE — Discharge Summary (Signed)
NAME:  Tonya [name], Tonya [name] NO.:  [id number]   MEDICAL RECORD NO.:  [id number]          PATIENT TYPE:  OIB   LOCATION:  6529                         FACILITY:  [facility]   PHYSICIAN:  Fay Records, M.D.    DATE OF BIRTH:  January 23, 1941   DATE OF ADMISSION:  01/22/2006  DATE OF DISCHARGE:  01/23/2006                                 DISCHARGE SUMMARY   REASON FOR ADMISSION:  Stable angina and abnormal Cardiolite.   DISCHARGE DIAGNOSES:  1.  Coronary artery disease.      1.  Status post bare metal stenting of the proximal left anterior          descending on January 22, 2006 by Dr. Albertine Patricia.  2.  Good left ventricular function.  3.  Diabetes mellitus, type 2.  4.  Hypertension.  5.  Hyperlipidemia.  6.  Anxiety/depression.  7.  Gastroesophageal reflux disease.  8.  Questionable history of rheumatic heart disease.   PROCEDURES PERFORMED:  1.  Cardiac catheterization by Dr. Haroldine Laws on January 20, 2006.  Results:      40% ostial left main stenosis, 95% LAD lesion, 50% ostial  First diagonal lesion, right to left collaterals from the RCA to the distal  LAD, ejection fraction 65%, no wall motion abnormalities.  1.  Percutaneous coronary intervention by Dr. Sabino Snipes on January 22, 2006.  Bare metal stenting to the proximal LAD, IVUS of the LAD, and      StarClose of the right common femoral artery.   HISTORY OF PRESENT ILLNESS:  Ms. Trescott is a 70 year old female patient who  saw Dr. Haroldine Laws on January 15, 2006 with complaints of several episodes of  exertional chest pressure and mild shortness of breath which relieved with  rest.  An adenosine Myoview was done on January 07, 2006 which showed anterior  ischemia.  She was placed on Coreg and Imdur and set up for outpatient  cardiac catheterization.   HOSPITAL COURSE:  As noted above, the patient was noted to have one-vessel  CAD, and she was set up for PCI of her LAD.  That was performed on January 22, 2006 by Dr.  Albertine Patricia.  She tolerated the procedure well, and there were no  immediate complications.  On the morning of January 23, 2006, she was in  stable condition.  Right groin was without hematoma or bruits.  It was felt  that she was ready for discharge to home.  She would resume her Glucovance  on Sunday, January 24, 2006.  She would need to remain on Plavix for 30 days  and aspirin indefinitely.   DISCHARGE MEDICATIONS:  1.  Actos 30 mg daily.  2.  Cartia XT 240 mg daily.  3.  Coreg CR 10 mg daily.  4.  __________ 60 mg daily.  5.  Glucovance 2.5/500 mg one in the morning and two in the evening to be      resumed on January 24, 2006.  6.  Iron 65 mg b.i.d.  7.  Imdur 30 mg daily.  8.  Benicar 20/25 mg b.i.d.  9.  Omeprazole 20 mg two tablets daily.  10. K-Dur 20 mEq b.i.d.  11. Relafen 250 mg b.i.d.  12. Darvocet p.r.n.  13. Nitroglycerin p.r.n.  14. Xanax p.r.n.  15. Calcium with vitamin D 600 mg b.i.d.  16. Multivitamin daily.  17. Lipitor 80 mg q.h.s.  18. Plavix 75 mg daily.  19. Aspirin 325 mg daily.   DIET:  Low fat, low salt diabetic diet.   WOUND CARE:  The patient is to call us for any groin swelling, bleeding, or  bruising.   ACTIVITY:  She is to increase her activity slowly.  No driving or heavy  lifting for three days.  She can return to work on Feb 01, 2006.   LABORATORY DATA:  At discharge, white count 8100, hemoglobin 11.3,  hematocrit 32, platelets 151,000.  INR 0.9.  Sodium 138, potassium 3.6,  chloride 102, CO2 28, glucose 161, BUN 10, creatinine 0.8.  Post-procedure  CK-MB 2.1.   FOLLOW UP:  The patient is to follow up with the P.A. for Dr. Haroldine Laws on  Jan 28, 2006 at 9 o'clock in the morning.      Richardson Dopp, P.A.    ______________________________  Fay Records, M.D.    SW/MEDQ  D:  01/23/2006  T:  01/24/2006  Job:  [id number]   cc:   Annie Main A. Sarajane Jews, M.D. [facility]  [place]  [place] [address]

## 2011-02-13 NOTE — Discharge Summary (Signed)
NAME:  Tonya, [name] NO.:  [id number]   MEDICAL RECORD NO.:  [id number]          PATIENT TYPE:  OIB   LOCATION:  6529                         FACILITY:  [facility]   PHYSICIAN:  Ethelle Lyon, M.D. LHCDATE OF BIRTH:  Oct 07, 1940   DATE OF ADMISSION:  01/22/2006  DATE OF DISCHARGE:  01/23/2006                                 DISCHARGE SUMMARY   ADDENDUM:  Total physician and PA time on this discharge greater than 30  minutes.      Richardson Dopp, P.A.      Ethelle Lyon, M.D. [facility]  Electronically Signed    SW/MEDQ  D:  01/24/2006  T:  01/25/2006  Job:  [id number]

## 2011-02-13 NOTE — Assessment & Plan Note (Signed)
[place]                                 ON-CALL NOTE   NAME:JOHNSONNovaly, Tonya [name]                      MRN:          [id number]  DATE:08/25/2006                            DOB:          06-Feb-1941    The patient has had nausea and vomiting since 2:30 this morning, called  was about 6 o'clock in the evening.  She thought it might have been  secondary to an antibiotic that was given to her by Annie Main A. Sarajane Jews, M.D.  yesterday when she was seen in the office for urinary tract infection.  She lives by herself, but her daughter is coming to visit her.  She is  feeling weak and dizzy and having difficulty walking across the room at  present.  She has some chills, but no known fever.  Her face feels hot  and her urinary pain is slightly better.  She has taken two Phenergan in  the last day, but it did not help her vomiting.  I have recommended that  she be evaluated in the emergency room and may need IV fluids.  If she  is feeling badly, she should call 911, otherwise her daughter could  probably transport her.     Standley Brooking. Panosh, MD  Electronically Signed    WKP/MedQ  DD: 08/25/2006  DT: 08/26/2006  Job #: [id number]

## 2011-02-13 NOTE — Op Note (Signed)
[place]  Patient:    Tonya [name], Tonya [name] Visit Number: [id number] MRN: [id number]          Service Type: DSU Location: [facility] Attending Physician:  Donnamae Jude Ii Dictated by:   Karen Chafe. Wynona Luna, M.D. Proc. Date: 04/10/02 Admit Date:  04/10/2002                             Operative Report  PREOPERATIVE DIAGNOSIS:  POSTOPERATIVE DIAGNOSIS:  Torn medial meniscus with osteoarthritis.  OPERATION PERFORMED:  Arthroscopic medial meniscectomy.  SURGEON:  John L. Wynona Luna, M.D.  ANESTHESIA:  General.  INDICATIONS FOR PROCEDURE:  History of medial right knee pain with MRI positive for torn medial meniscus and osteoarthritis, tricompartmental.  DESCRIPTION OF PROCEDURE:  Under general anesthesia, the patients left leg was prepared with DuraPrep and draped as a sterile field and a leg holder with a tourniquet.  The leg was wrapped out with an Esmarch and the tourniquet was used at 350 mm.  Standard portals were used and diagnostic arthroscopy revealed a complex posterior horn tear of the medial meniscus with some small patches of eburnated bone and peeling bone.  Using basket forceps, the torn meniscus was resected back to stable rim and the intra-articular shaver was then used through both medial and lateral portals to trim the meniscal rim, remove debris and perform a chondroplasty.  Attention was then turned to the remainder of the knee.  Patellofemoral articulation was amazingly good, lateral compartment amazingly good and debris was washed out of the recesses. Once this was completed, the tourniquet was let down, punctures were closed with 4-0 nylon, the knee was infiltrated with Marcaine with epinephrine and a sterile compression bandage was applied.  The patient was transferred to the recovery room in good condition. Dictated by:   Karen Chafe. Wynona Luna, M.D. Attending Physician:  Donnamae Jude Ii DD:  04/10/02 TD:   04/11/02 Job: [id number]

## 2011-02-19 ENCOUNTER — Other Ambulatory Visit (INDEPENDENT_AMBULATORY_CARE_PROVIDER_SITE_OTHER): Payer: Medicare Other | Admitting: Family Medicine

## 2011-02-19 DIAGNOSIS — E119 Type 2 diabetes mellitus without complications: Secondary | ICD-10-CM

## 2011-02-19 DIAGNOSIS — I10 Essential (primary) hypertension: Secondary | ICD-10-CM

## 2011-02-19 DIAGNOSIS — K219 Gastro-esophageal reflux disease without esophagitis: Secondary | ICD-10-CM

## 2011-02-19 DIAGNOSIS — E785 Hyperlipidemia, unspecified: Secondary | ICD-10-CM

## 2011-02-19 LAB — BASIC METABOLIC PANEL
CO2: 27 mEq/L (ref 19–32)
Chloride: 105 mEq/L (ref 96–112)
Creatinine, Ser: 1.2 mg/dL (ref 0.4–1.2)
Potassium: 4.5 mEq/L (ref 3.5–5.1)
Sodium: 140 mEq/L (ref 135–145)

## 2011-02-19 LAB — CBC WITH DIFFERENTIAL/PLATELET
Basophils Absolute: 0 10*3/uL (ref 0.0–0.1)
Eosinophils Absolute: 0.2 10*3/uL (ref 0.0–0.7)
Hemoglobin: 11.5 g/dL — ABNORMAL LOW (ref 12.0–15.0)
Lymphocytes Relative: 17.6 % (ref 12.0–46.0)
MCHC: 34.4 g/dL (ref 30.0–36.0)
Monocytes Relative: 6.2 % (ref 3.0–12.0)
Neutrophils Relative %: 73.6 % (ref 43.0–77.0)
Platelets: 206 10*3/uL (ref 150.0–400.0)
RDW: 13.7 % (ref 11.5–14.6)

## 2011-02-19 LAB — HEPATIC FUNCTION PANEL
ALT: 20 U/L (ref 0–35)
Alkaline Phosphatase: 66 U/L (ref 39–117)
Bilirubin, Direct: 0.1 mg/dL (ref 0.0–0.3)
Total Bilirubin: 0.7 mg/dL (ref 0.3–1.2)
Total Protein: 7.1 g/dL (ref 6.0–8.3)

## 2011-02-19 LAB — LIPID PANEL
LDL Cholesterol: 20 mg/dL (ref 0–99)
Total CHOL/HDL Ratio: 2
Triglycerides: 154 mg/dL — ABNORMAL HIGH (ref 0.0–149.0)

## 2011-02-19 LAB — TSH: TSH: 3.24 u[IU]/mL (ref 0.35–5.50)

## 2011-02-24 ENCOUNTER — Encounter: Payer: Self-pay | Admitting: Family Medicine

## 2011-02-24 ENCOUNTER — Ambulatory Visit (INDEPENDENT_AMBULATORY_CARE_PROVIDER_SITE_OTHER): Payer: Medicare Other | Admitting: Family Medicine

## 2011-02-24 DIAGNOSIS — I10 Essential (primary) hypertension: Secondary | ICD-10-CM

## 2011-02-24 DIAGNOSIS — F3289 Other specified depressive episodes: Secondary | ICD-10-CM

## 2011-02-24 DIAGNOSIS — Z0000 Encounter for general adult medical examination without abnormal findings: Secondary | ICD-10-CM

## 2011-02-24 DIAGNOSIS — E785 Hyperlipidemia, unspecified: Secondary | ICD-10-CM

## 2011-02-24 DIAGNOSIS — F329 Major depressive disorder, single episode, unspecified: Secondary | ICD-10-CM

## 2011-02-24 DIAGNOSIS — E119 Type 2 diabetes mellitus without complications: Secondary | ICD-10-CM

## 2011-02-24 DIAGNOSIS — I4891 Unspecified atrial fibrillation: Secondary | ICD-10-CM

## 2011-02-24 LAB — HM DIABETES FOOT EXAM

## 2011-02-24 MED ORDER — TRIAMCINOLONE ACETONIDE 0.5 % EX CREA: TOPICAL_CREAM | Freq: Two times a day (BID) | CUTANEOUS | Status: DC

## 2011-02-24 NOTE — Patient Instructions (Addendum)
**  Return for labs to recheck kidney function in 2 weeks. ** Call for increase in celexa if mood issues becoming more frequent. Apply steroid cream to rash x 2 weeks, call if not improving as expected.

## 2011-02-24 NOTE — Assessment & Plan Note (Signed)
Occ mood  issues on celexa, but overall stqable.

## 2011-02-24 NOTE — Assessment & Plan Note (Signed)
Well controlled. Continue current medication.  

## 2011-02-24 NOTE — Progress Notes (Signed)
Subjective:    Patient ID: Tonya [name], female    DOB: Mar 11, 1941, 70 y.o.   MRN: [id number]  HPI I have personally reviewed the Medicare Annual Wellness questionnaire and have noted 1. The patient's medical and social history 2. Their use of alcohol, tobacco or illicit drugs 3. Their current medications and supplements 4. The patient's functional ability including ADL's, fall risks, home safety risks and hearing or visual             impairment. 5. Diet and physical activities 6. Evidence for depression or mood disorders The patients weight, height, BMI and visual acuity have been recorded in the chart I have made referrals, counseling and provided education to the patient based review of the above and I have provided the pt with a written personalized care plan for preventive services.   Hypertension: Well controlled   Using medication without problems or lightheadedness:  Chest pain with exertion:None Edema None Short of breath:None  Average home BPs: 120-145/65-80 Other issues: CAD, afib: stable per cards, last OV in 09/2010  Diabetes:  On glucovance Using medications without difficulties: Hypoglycemic episodes:None Hyperglycemic episodes:None Feet problems:None Blood Sugars averaging: 85-105 Eye exam within last year: 12/12/10  Elevated Cholesterol:well controlled. Using medications without problems: Muscle aches:  None Other complaints:   24 hours of itching rash, red on left calf. No new known exposures. No new meds, diet.  no hx of similar. Improved some with cortisone cream.  Some mild decline in kidney function: 0.8/1.08  Now up to 1.2. No dehydration, no D/V. Questran controls diarrhea. No new meds.   Review of Systems  Constitutional: Negative for fever and fatigue.  HENT: Negative for ear pain.   Eyes: Negative for pain.  Respiratory: Negative for chest tightness and shortness of breath.   Cardiovascular: Negative for chest pain, palpitations and leg  swelling.  Gastrointestinal: Negative for abdominal pain.  Genitourinary: Negative for dysuria.       Objective:   Physical Exam  Constitutional: Vital signs are normal. She appears well-developed and well-nourished. She is cooperative.  Non-toxic appearance. She does not appear ill. No distress.  HENT:  Head: Normocephalic.  Right Ear: Hearing, tympanic membrane, external ear and ear canal normal.  Left Ear: Hearing, tympanic membrane, external ear and ear canal normal.  Nose: Nose normal.  Eyes: Conjunctivae, EOM and lids are normal. Pupils are equal, round, and reactive to light. No foreign bodies found.  Neck: Trachea normal and normal range of motion. Neck supple. Carotid bruit is not present. No mass and no thyromegaly present.  Cardiovascular: Normal rate, regular rhythm, S1 normal, S2 normal, normal heart sounds and intact distal pulses.  Exam reveals no gallop.   No murmur heard. Pulmonary/Chest: Effort normal and breath sounds normal. No respiratory distress. She has no wheezes. She has no rhonchi. She has no rales.  Abdominal: Soft. Normal appearance and bowel sounds are normal. She exhibits no distension, no fluid wave, no abdominal bruit and no mass. There is no hepatosplenomegaly. There is no tenderness. There is no rebound, no guarding and no CVA tenderness. No hernia.  Genitourinary: No breast swelling, tenderness, discharge or bleeding. Pelvic exam was performed with patient prone.  Lymphadenopathy:    She has no cervical adenopathy.    She has no axillary adenopathy.  Neurological: She is alert. She has normal strength. No cranial nerve deficit or sensory deficit.  Skin: Skin is warm, dry and intact. No rash noted.  Psychiatric: Her speech is normal and  behavior is normal. Judgment normal. Her mood appears not anxious. Cognition and memory are normal. She does not exhibit a depressed mood.      Diabetic foot exam: Normal inspection No skin breakdown No calluses    Normal DP pulses Normal sensation to light tough and monofilament Nails normal     Assessment & Plan:  Annual Medicare Wellness: The patient's preventative maintenance and recommended screening tests for an annual wellness exam were reviewed in full today. Brought up to date unless services declined.  Counselled on the importance of diet, exercise, and its role in overall health and mortality. The patient's FH and SH was reviewed, including their home life, tobacco status, and drug and alcohol status.   Not interested in DEXA this year.  MAmmogram due  In 08/2011. Uptodate with colonoscopy with hx of colon cancer.. Repeat in 2013.

## 2011-03-04 ENCOUNTER — Other Ambulatory Visit: Payer: Self-pay | Admitting: Family Medicine

## 2011-03-06 ENCOUNTER — Other Ambulatory Visit: Payer: Self-pay | Admitting: Family Medicine

## 2011-03-10 ENCOUNTER — Telehealth: Payer: Self-pay | Admitting: Family Medicine

## 2011-03-10 ENCOUNTER — Other Ambulatory Visit (INDEPENDENT_AMBULATORY_CARE_PROVIDER_SITE_OTHER): Payer: Medicare Other | Admitting: Family Medicine

## 2011-03-10 DIAGNOSIS — I10 Essential (primary) hypertension: Secondary | ICD-10-CM

## 2011-03-10 DIAGNOSIS — N179 Acute kidney failure, unspecified: Secondary | ICD-10-CM

## 2011-03-10 DIAGNOSIS — E119 Type 2 diabetes mellitus without complications: Secondary | ICD-10-CM

## 2011-03-10 LAB — BASIC METABOLIC PANEL
BUN: 18 mg/dL (ref 6–23)
Calcium: 8.8 mg/dL (ref 8.4–10.5)
Creatinine, Ser: 1.3 mg/dL — ABNORMAL HIGH (ref 0.4–1.2)
GFR: 44.09 mL/min — ABNORMAL LOW (ref >60.00)

## 2011-03-10 NOTE — Progress Notes (Signed)
Addended by: Marchia Bond on: 03/10/2011 09:37 AM   Modules accepted: Orders

## 2011-03-10 NOTE — Telephone Encounter (Signed)
Patient advised and has appt for firday morning labs

## 2011-03-10 NOTE — Telephone Encounter (Signed)
Notify pt that kidney function is slightly worsen even than last check.  Stop lisinopril, recheck  early on Friday.

## 2011-03-13 ENCOUNTER — Other Ambulatory Visit (INDEPENDENT_AMBULATORY_CARE_PROVIDER_SITE_OTHER): Payer: Medicare Other | Admitting: Family Medicine

## 2011-03-13 DIAGNOSIS — N179 Acute kidney failure, unspecified: Secondary | ICD-10-CM

## 2011-03-13 LAB — BASIC METABOLIC PANEL
Chloride: 104 mEq/L (ref 96–112)
GFR: 56.15 mL/min — ABNORMAL LOW (ref >60.00)
Glucose, Bld: 122 mg/dL — ABNORMAL HIGH (ref 70–99)
Potassium: 4.7 mEq/L (ref 3.5–5.1)
Sodium: 141 mEq/L (ref 135–145)

## 2011-03-14 LAB — CREATININE, URINE, RANDOM: Creatinine, Urine: 37.1 mg/dL

## 2011-04-07 ENCOUNTER — Telehealth: Payer: Self-pay | Admitting: *Deleted

## 2011-04-07 MED ORDER — CITALOPRAM HYDROBROMIDE 40 MG PO TABS: ORAL_TABLET | ORAL | Status: DC

## 2011-04-07 NOTE — Telephone Encounter (Signed)
Patient advised.

## 2011-04-07 NOTE — Telephone Encounter (Signed)
Let pt know I may have told her incorrectly... They don'e make a 60 tab size... Continue at 1 1and 1/2 tabs... New prescription with this new dose sent in.

## 2011-04-07 NOTE — Telephone Encounter (Signed)
Pt states she was told at last office visit that she could increase celexa dose to 60 mg's if she wants to.  She does.  She has 40 mg tablets now and will take one and a half.  She would like a new script for the higher dose sent to [facility].

## 2011-04-08 NOTE — Telephone Encounter (Signed)
Pharmacy told pt and myself that they didn't get the script that was sent in yesterday, so I called it in to them.

## 2011-07-13 ENCOUNTER — Other Ambulatory Visit: Payer: Self-pay | Admitting: Gastroenterology

## 2011-08-13 ENCOUNTER — Other Ambulatory Visit: Payer: Self-pay | Admitting: Family Medicine

## 2011-08-13 DIAGNOSIS — Z1231 Encounter for screening mammogram for malignant neoplasm of breast: Secondary | ICD-10-CM

## 2011-08-28 ENCOUNTER — Other Ambulatory Visit (INDEPENDENT_AMBULATORY_CARE_PROVIDER_SITE_OTHER): Payer: Medicare Other

## 2011-08-28 DIAGNOSIS — E119 Type 2 diabetes mellitus without complications: Secondary | ICD-10-CM

## 2011-08-28 DIAGNOSIS — I10 Essential (primary) hypertension: Secondary | ICD-10-CM

## 2011-08-28 LAB — COMPREHENSIVE METABOLIC PANEL
Alkaline Phosphatase: 80 U/L (ref 39–117)
BUN: 19 mg/dL (ref 6–23)
Creatinine, Ser: 1.1 mg/dL (ref 0.4–1.2)
Glucose, Bld: 128 mg/dL — ABNORMAL HIGH (ref 70–99)
Total Bilirubin: 0.5 mg/dL (ref 0.3–1.2)

## 2011-08-28 LAB — LIPID PANEL
Cholesterol: 74 mg/dL (ref 0–200)
HDL: 30.6 mg/dL — ABNORMAL LOW (ref >39.00)
LDL Cholesterol: 15 mg/dL (ref 0–99)
VLDL: 28.2 mg/dL (ref 0.0–40.0)

## 2011-08-28 LAB — HEMOGLOBIN A1C: Hgb A1c MFr Bld: 6.5 % (ref 4.6–6.5)

## 2011-09-03 ENCOUNTER — Ambulatory Visit (INDEPENDENT_AMBULATORY_CARE_PROVIDER_SITE_OTHER): Payer: Medicare Other | Admitting: Family Medicine

## 2011-09-03 ENCOUNTER — Encounter: Payer: Self-pay | Admitting: Family Medicine

## 2011-09-03 DIAGNOSIS — E119 Type 2 diabetes mellitus without complications: Secondary | ICD-10-CM

## 2011-09-03 DIAGNOSIS — E785 Hyperlipidemia, unspecified: Secondary | ICD-10-CM

## 2011-09-03 DIAGNOSIS — F329 Major depressive disorder, single episode, unspecified: Secondary | ICD-10-CM

## 2011-09-03 DIAGNOSIS — I10 Essential (primary) hypertension: Secondary | ICD-10-CM

## 2011-09-03 LAB — HM DIABETES FOOT EXAM

## 2011-09-03 MED ORDER — HYDROCHLOROTHIAZIDE 25 MG PO TABS: 25.0000 mg | ORAL_TABLET | Freq: Every day | ORAL | Status: DC

## 2011-09-03 NOTE — Patient Instructions (Signed)
Restart HCTZ component of BP medicine. Follow BP at home, call if remaining above 130/80.  Continue working on lifestyle changes.

## 2011-09-03 NOTE — Assessment & Plan Note (Signed)
Poor control off lisinopril. This med was held due to a bump up in creatinine above a 0.3 change. Creatinine returned to normal and has remained nml off this med.  Will add back HCTZ for better BP control. If cardiology feels benefit of ACEI high enough, we can try a retial of lisinopril and follow creatinine.

## 2011-09-03 NOTE — Assessment & Plan Note (Signed)
Excellent control. Discussed trial of decreasing medication.. She is not having problems and does not wish to do so. Can discuss woth cardiology as well.  Continue working on increasing HDL, discuss diet and exercise changes to do so.

## 2011-09-03 NOTE — Progress Notes (Signed)
Subjective:    Patient ID: Tonya [name], female    DOB: 1941-06-22, 70 y.o.   MRN: [id number]  HPI  Diabetes:  Well controlled on glucovance. Lab Results  Component Value Date   HGBA1C 6.5 08/28/2011  Using medications without difficulties: Hypoglycemic episodes:None Hyperglycemic episodes:None Feet problems:Ache in feet, on ball of foot. No open sores, etc. Blood Sugars averaging: Checking daily, FBS 85-115 eye exam within last year: 11/2010  Elevated Cholesterol: Excellent control on lipitor 80 mg daily. HDL low. She is not interested in trial of lower dose lipitor. She is not having SE or probs with this med. Using medications without problems:None Muscle aches: None Diet compliance:Good Exercise:Good Other complaints: CAD, parox afib Rate controlled HAs appt with Cardiology in 09/2011  Hypertension:   Inadequate control on coreg, diltiazem. No longer on lisinopril, HCTZ.. Stopped due to increasing creatinine.  Creatinine now back in normal range Using medication without problems or lightheadedness: None Chest pain with exertion:None Edema:None Short of breath: Had some issues with slight increasing in shortness of breath.. Better now in last few weeks. No wheeze, no cough currently, but getting over a bad viral infection. Average home BPs: 149-136/ 58-72, HR 61 Other issues:  Depression: improved control on higher dose of cymbalta.. Was doing so well she dropped back to 1 tab in last 3 weeks. She has continued to do well on this dose. Less stress, keeping grandkids less often.   Review of Systems  Constitutional: Negative for fever and fatigue.  HENT: Negative for ear pain.   Eyes: Negative for pain.  Respiratory: Positive for shortness of breath. Negative for chest tightness.   Cardiovascular: Negative for chest pain, palpitations and leg swelling.  Gastrointestinal: Negative for abdominal pain.  Genitourinary: Negative for dysuria.       Objective:   Physical  Exam  Constitutional: Vital signs are normal. She appears well-developed and well-nourished. She is cooperative.  Non-toxic appearance. She does not appear ill. No distress.  HENT:  Head: Normocephalic.  Right Ear: Hearing, tympanic membrane, external ear and ear canal normal. Tympanic membrane is not erythematous, not retracted and not bulging.  Left Ear: Hearing, tympanic membrane, external ear and ear canal normal. Tympanic membrane is not erythematous, not retracted and not bulging.  Nose: No mucosal edema or rhinorrhea. Right sinus exhibits no maxillary sinus tenderness and no frontal sinus tenderness. Left sinus exhibits no maxillary sinus tenderness and no frontal sinus tenderness.  Mouth/Throat: Uvula is midline, oropharynx is clear and moist and mucous membranes are normal.  Eyes: Conjunctivae, EOM and lids are normal. Pupils are equal, round, and reactive to light. No foreign bodies found.  Neck: Trachea normal and normal range of motion. Neck supple. Carotid bruit is not present. No mass and no thyromegaly present.  Cardiovascular: Normal rate, regular rhythm, S1 normal, S2 normal, normal heart sounds, intact distal pulses and normal pulses.  Exam reveals no gallop and no friction rub.   No murmur heard. Pulmonary/Chest: Effort normal and breath sounds normal. Not tachypneic. No respiratory distress. She has no decreased breath sounds. She has no wheezes. She has no rhonchi. She has no rales.  Abdominal: Soft. Normal appearance and bowel sounds are normal. There is no tenderness.  Neurological: She is alert.  Skin: Skin is warm, dry and intact. No rash noted.  Psychiatric: Her speech is normal and behavior is normal. Judgment and thought content normal. Her mood appears not anxious. Cognition and memory are normal. She does not exhibit a depressed  mood.    Diabetic foot exam: Normal inspection No skin breakdown No calluses  Normal DP pulses Normal sensation to light touch and  monofilament Nails normal       Assessment & Plan:

## 2011-09-03 NOTE — Assessment & Plan Note (Signed)
Improved control on current medication dose. NO SI.

## 2011-09-03 NOTE — Assessment & Plan Note (Signed)
Well controlled. Continue current medication. Encouraged exercise, weight loss, healthy eating habits.  

## 2011-09-04 ENCOUNTER — Other Ambulatory Visit: Payer: Self-pay | Admitting: Family Medicine

## 2011-09-18 ENCOUNTER — Ambulatory Visit
Admission: RE | Admit: 2011-09-18 | Discharge: 2011-09-18 | Disposition: A | Discharge status: Home / Self Care | Payer: Medicare Other | Source: Ambulatory Visit | Attending: Family Medicine | Admitting: Family Medicine

## 2011-09-18 DIAGNOSIS — Z1231 Encounter for screening mammogram for malignant neoplasm of breast: Secondary | ICD-10-CM

## 2011-10-13 ENCOUNTER — Other Ambulatory Visit: Payer: Self-pay | Admitting: *Deleted

## 2011-10-13 MED ORDER — FERROUS SULFATE 325 (65 FE) MG PO TBEC: 325.0000 mg | DELAYED_RELEASE_TABLET | Freq: Two times a day (BID) | ORAL | Status: DC

## 2011-10-13 MED ORDER — DILTIAZEM HCL ER COATED BEADS 240 MG PO CP24: 240.0000 mg | ORAL_CAPSULE | Freq: Every day | ORAL | Status: DC

## 2011-10-13 MED ORDER — HYDROCHLOROTHIAZIDE 25 MG PO TABS: 25.0000 mg | ORAL_TABLET | Freq: Every day | ORAL | Status: DC

## 2011-10-13 MED ORDER — POTASSIUM CHLORIDE CRYS ER 20 MEQ PO TBCR: 20.0000 meq | EXTENDED_RELEASE_TABLET | Freq: Two times a day (BID) | ORAL | Status: DC

## 2011-10-13 MED ORDER — NABUMETONE 750 MG PO TABS: 750.0000 mg | ORAL_TABLET | Freq: Two times a day (BID) | ORAL | Status: DC

## 2011-10-13 MED ORDER — CARVEDILOL 12.5 MG PO TABS: 12.5000 mg | ORAL_TABLET | Freq: Two times a day (BID) | ORAL | Status: DC

## 2011-10-13 MED ORDER — OMEPRAZOLE 20 MG PO CPDR: 40.0000 mg | DELAYED_RELEASE_CAPSULE | Freq: Every day | ORAL | Status: DC

## 2011-10-13 MED ORDER — ATORVASTATIN CALCIUM 80 MG PO TABS: 80.0000 mg | ORAL_TABLET | Freq: Every day | ORAL | Status: DC

## 2011-10-13 MED ORDER — CITALOPRAM HYDROBROMIDE 40 MG PO TABS: 40.0000 mg | ORAL_TABLET | Freq: Every day | ORAL | Status: DC

## 2011-10-13 MED ORDER — GLYBURIDE-METFORMIN 1.25-250 MG PO TABS: 1.0000 | ORAL_TABLET | Freq: Two times a day (BID) | ORAL | Status: DC

## 2011-11-29 LAB — HM DIABETES EYE EXAM

## 2011-12-09 ENCOUNTER — Telehealth: Payer: Self-pay | Admitting: Gastroenterology

## 2011-12-09 ENCOUNTER — Other Ambulatory Visit: Payer: Self-pay | Admitting: Gastroenterology

## 2011-12-09 MED ORDER — CHOLESTYRAMINE LIGHT 4 GM/DOSE PO POWD: 4.0000 g | Freq: Two times a day (BID) | ORAL | Status: DC

## 2011-12-09 NOTE — Telephone Encounter (Signed)
Pt was scheduled an office visit for a follow up and med refills

## 2011-12-13 DIAGNOSIS — M1710 Unilateral primary osteoarthritis, unspecified knee: Secondary | ICD-10-CM | POA: Diagnosis not present

## 2011-12-14 DIAGNOSIS — M1710 Unilateral primary osteoarthritis, unspecified knee: Secondary | ICD-10-CM | POA: Diagnosis not present

## 2011-12-17 DIAGNOSIS — E119 Type 2 diabetes mellitus without complications: Secondary | ICD-10-CM | POA: Diagnosis not present

## 2011-12-17 DIAGNOSIS — H04129 Dry eye syndrome of unspecified lacrimal gland: Secondary | ICD-10-CM | POA: Diagnosis not present

## 2011-12-17 DIAGNOSIS — H2510 Age-related nuclear cataract, unspecified eye: Secondary | ICD-10-CM | POA: Diagnosis not present

## 2011-12-18 ENCOUNTER — Other Ambulatory Visit (HOSPITAL_COMMUNITY): Payer: Self-pay | Admitting: Orthopaedic Surgery

## 2011-12-18 DIAGNOSIS — Z471 Aftercare following joint replacement surgery: Secondary | ICD-10-CM | POA: Diagnosis not present

## 2011-12-18 DIAGNOSIS — Z96659 Presence of unspecified artificial knee joint: Secondary | ICD-10-CM | POA: Diagnosis not present

## 2011-12-18 DIAGNOSIS — M25561 Pain in right knee: Secondary | ICD-10-CM

## 2011-12-24 ENCOUNTER — Encounter: Payer: Self-pay | Admitting: Family Medicine

## 2012-01-04 ENCOUNTER — Other Ambulatory Visit: Payer: Self-pay | Admitting: Family Medicine

## 2012-01-06 ENCOUNTER — Encounter: Payer: Self-pay | Admitting: Gastroenterology

## 2012-01-06 ENCOUNTER — Ambulatory Visit (INDEPENDENT_AMBULATORY_CARE_PROVIDER_SITE_OTHER): Payer: Medicare Other | Admitting: Gastroenterology

## 2012-01-06 VITALS — BP 154/68 | HR 68 | Ht 66.25 in | Wt 182.0 lb

## 2012-01-06 DIAGNOSIS — R197 Diarrhea, unspecified: Secondary | ICD-10-CM

## 2012-01-06 MED ORDER — CHOLESTYRAMINE LIGHT 4 G PO PACK: 4.0000 g | PACK | Freq: Two times a day (BID) | ORAL | Status: DC

## 2012-01-06 NOTE — Patient Instructions (Signed)
New prescription for prevalite packs called in.  Will refill this at one year without need for ROV.

## 2012-01-06 NOTE — Progress Notes (Signed)
Review of gastrointestinal problems:  1. Synrchonous early stage right sided cancers, resected 11/2008; No positive lymph nodes, she did not require adjuvant chemotherapy. Repeat colonoscopy March 2011 showed diverticulosis, 2 small HPs, nodule at anastomosis that was NOT neoplastic, recall colonsocopy 3 year interval  2. Summer, 2010: Diarrhea since right sided hemicolectomy, terminal ileal resection in March 2010. Fecal leukocytes positive however all other stool testing was negative. Initially responded to cholestyramine, diarrhea returned and so was restarted wiith good response.  12/2011: Takes twice daily cholestyramine and without it she has diarrhea.   HPI: This is a  very pleasant 71 year old woman whom I last saw the time of a 2011 colonoscopy. See those results above.  Still has terrible diarrhea unless she takes cholestryamine twice a day.  AS long as she takes it her bowels are fine.  No bleeding.  Overall weight is up a bit lately. No signficant abd pains.    Past Medical History  Diagnosis Date  . Osteoarthritis   . Edema, peripheral   . CAD (coronary artery disease)     s/p BMS to LAD  . Breast pain     left  . Hyperlipidemia   . Hypertension   . Sinusitis     acute- NOS  . Allergic rhinitis   . Blood in stool   . Adenocarcinoma, colon   . Anemia   . Sciatica   . Cystitis, acute   . Chronic diarrhea   . Nausea   . GERD (gastroesophageal reflux disease)   . Depression   . Anxiety   . Osteoarthritis   . IBS (irritable bowel syndrome)   . Degenerative disk disease   . Carpal tunnel syndrome   . Diabetes mellitus, type 2   . Family history of diabetes mellitus     Past Surgical History  Procedure Date  . Knee arthroscopy     bilateral  . Cholecystectomy   . Cardiac catheterization 12/10    LAD stent patent. Insignificant CAD, otherwise EF 60-65%  . Partial colectomy 11/2008    right, for adenocarcinoma  . Replacement total knee [id number]    right,  complicated by hemarthrosis   . Hematoma evacuation     after knee replacement    Current Outpatient Prescriptions  Medication Sig Dispense Refill  . aspirin 81 MG tablet Take 81 mg by mouth daily.      Marland Kitchen atorvastatin (LIPITOR) 80 MG tablet Take 1 tablet (80 mg total) by mouth daily.  30 tablet  11  . calcium-vitamin D (OSCAL WITH D 500-200) 500-200 MG-UNIT per tablet Take 1 tablet by mouth 2 (two) times daily.        . carvedilol (COREG) 12.5 MG tablet Take 1 tablet (12.5 mg total) by mouth 2 (two) times daily with a meal.  60 tablet  10  . cholestyramine light (PREVALITE) 4 GM/DOSE powder Take 1 packet (4 g total) by mouth 2 (two) times daily with a meal.  239.4 g  1  . citalopram (CELEXA) 40 MG tablet Take 1 tablet (40 mg total) by mouth daily.  30 tablet  11  . diltiazem (CARTIA XT) 240 MG 24 hr capsule Take 1 capsule (240 mg total) by mouth daily.  30 capsule  11  . ferrous sulfate 325 (65 FE) MG EC tablet Take 1 tablet (325 mg total) by mouth 2 (two) times daily.  60 tablet  11  . Fish Oil OIL Take 3 capsules daily       .  glyBURIDE-metformin (GLUCOVANCE) 1.25-250 MG per tablet TAKE 1 TABLET BY MOUTH TWICE A DAY  60 tablet  2  . hydrochlorothiazide (HYDRODIURIL) 25 MG tablet Take 1 tablet (25 mg total) by mouth daily.  30 tablet  11  . Multiple Vitamin (MULTIVITAMIN) tablet Take 1 tablet by mouth daily.        . nabumetone (RELAFEN) 750 MG tablet Take 1 tablet (750 mg total) by mouth 2 (two) times daily. Alternating taking 1 tab twice a day and on alternate days taking 1 tablet daily  60 tablet  11  . omeprazole (PRILOSEC) 20 MG capsule Take 2 capsules (40 mg total) by mouth daily.  180 capsule  2  . potassium chloride SA (K-DUR,KLOR-CON) 20 MEQ tablet Take 1 tablet (20 mEq total) by mouth 2 (two) times daily.  60 tablet  11    Allergies as of 01/06/2012 - Review Complete 01/06/2012  Allergen Reaction Noted  . [id number] acid+aspartame      Family History    Problem Relation Age of Onset  . Heart disease Mother     massive MI age 84  . Heart disease Father     Massive MI age 49    History   Social History  . Marital Status: Divorced    Spouse Name: N/A    Number of Children: 3  . Years of Education: N/A   Occupational History  . retired   .     Social History Main Topics  . Smoking status: Never Smoker   . Smokeless tobacco: Never Used  . Alcohol Use: No  . Drug Use: No  . Sexually Active: Not on file   Other Topics Concern  . Not on file   Social History Narrative   No regular exercise, limited due to kneesDiet- addicted to sweets, some fruit and veggies, water daily.Has NO  living will or HCPOA      Physical Exam: BP 154/68  Pulse 68  Ht 5' 6.25" (1.683 m)  Wt 182 lb (82.555 kg)  BMI 29.15 kg/m2 Constitutional: generally well-appearing Psychiatric: alert and oriented x3 Abdomen: soft, nontender, nondistended, no obvious ascites, no peritoneal signs, normal bowel sounds     Assessment and plan: 71 y.o. female with chronic loose stools since right hemicolectomy, terminal ileal resection  I refill her prescription for cholestyramine 4 g twice daily. She will continue on this and I'm happy to refill it again at 1 year without need for office visit. She knows that she has significant changes in her bowels she should call sooner. She is due for repeat colonoscopy for high-risk surveillance in about one year and is already in our reminder system.

## 2012-01-07 ENCOUNTER — Encounter (HOSPITAL_COMMUNITY): Payer: Self-pay

## 2012-01-07 ENCOUNTER — Encounter (HOSPITAL_COMMUNITY)
Admission: RE | Admit: 2012-01-07 | Discharge: 2012-01-07 | Disposition: A | Discharge status: Home / Self Care | Payer: Medicare Other | Source: Ambulatory Visit | Attending: Orthopaedic Surgery | Admitting: Orthopaedic Surgery

## 2012-01-07 DIAGNOSIS — M25561 Pain in right knee: Secondary | ICD-10-CM

## 2012-01-07 DIAGNOSIS — M25569 Pain in unspecified knee: Secondary | ICD-10-CM | POA: Insufficient documentation

## 2012-01-07 DIAGNOSIS — Z96659 Presence of unspecified artificial knee joint: Secondary | ICD-10-CM | POA: Insufficient documentation

## 2012-01-07 MED ORDER — TECHNETIUM TC 99M MEDRONATE IV KIT
25.0000 | PACK | Freq: Once | INTRAVENOUS | Status: AC | PRN
  Administered 2012-01-07: 25 via INTRAVENOUS

## 2012-01-15 DIAGNOSIS — M25569 Pain in unspecified knee: Secondary | ICD-10-CM | POA: Diagnosis not present

## 2012-01-29 ENCOUNTER — Ambulatory Visit (INDEPENDENT_AMBULATORY_CARE_PROVIDER_SITE_OTHER): Payer: Medicare Other | Admitting: Family Medicine

## 2012-01-29 ENCOUNTER — Encounter: Payer: Self-pay | Admitting: Family Medicine

## 2012-01-29 VITALS — BP 138/80 | HR 68 | Temp 98.6°F | Ht 66.25 in | Wt 182.4 lb

## 2012-01-29 DIAGNOSIS — I2510 Atherosclerotic heart disease of native coronary artery without angina pectoris: Secondary | ICD-10-CM | POA: Diagnosis not present

## 2012-01-29 DIAGNOSIS — Z01818 Encounter for other preprocedural examination: Secondary | ICD-10-CM | POA: Diagnosis not present

## 2012-01-29 DIAGNOSIS — I4891 Unspecified atrial fibrillation: Secondary | ICD-10-CM | POA: Diagnosis not present

## 2012-01-29 DIAGNOSIS — E119 Type 2 diabetes mellitus without complications: Secondary | ICD-10-CM

## 2012-01-29 DIAGNOSIS — I10 Essential (primary) hypertension: Secondary | ICD-10-CM

## 2012-01-29 NOTE — Patient Instructions (Addendum)
Return for fasting labs.  We will return clearance form for medical clearance if appropriate. Keep appt for cardiac clearance given heart issues.

## 2012-01-29 NOTE — Progress Notes (Signed)
Subjective:    Patient ID: Tonya [name], female    DOB: 02-11-1941, 71 y.o.   MRN: [id number]  HPI 71 year old female with history of CAD, Afib, DM, colon cancer presents for surgical clearance for right knee surgery. May be infection or slippage of knee replacement... Returning for revision and replacement of knee replacement. She has cardiac clearance schedule with her cardiologist in next week.  Diabetes: Well controlled on glucovance.  Lab Results   Component  Value  Date    HGBA1C  6.5  08/28/2011   Using medications without difficulties:  Hypoglycemic episodes:None  Hyperglycemic episodes:None  Feet problems:Ache in feet, on ball of foot. No open sores, etc.  Blood Sugars averaging: Checking daily, FBS 85-125, no numbers >200 eye exam within last year: 11/2011    Hypertension: Improved control now on coreg, diltiazem. No longer on lisinopril, HCTZ.. Stopped due to increasing creatinine. Creatinine now back in normal range  Using medication without problems or lightheadedness: None  Chest pain with exertion: occ tightness in chest when walking to mailbox but not last few times Edema:None  Short of breath: Had some issues with slight increasing in shortness of breath, chest tightness.Marland KitchenMarland KitchenNo wheeze, no cough currently. Average home BPs:  132-145/60-70 Other issues:   She has tolerated multiple surgeries in past few years.    Review of Systems  Constitutional: Negative for fever and fatigue.  HENT: Negative for ear pain.   Eyes: Negative for pain.  Respiratory: Negative for chest tightness.   Cardiovascular: Negative for palpitations and leg swelling.  Gastrointestinal: Negative for abdominal pain.  Genitourinary: Negative for dysuria.       Objective:   Physical Exam  Constitutional: She is oriented to person, place, and time. Vital signs are normal. She appears well-developed and well-nourished. She is cooperative.  Non-toxic appearance. She does not appear ill. No  distress.  HENT:  Head: Normocephalic.  Right Ear: Hearing, tympanic membrane, external ear and ear canal normal. Tympanic membrane is not erythematous, not retracted and not bulging.  Left Ear: Hearing, tympanic membrane, external ear and ear canal normal. Tympanic membrane is not erythematous, not retracted and not bulging.  Nose: No mucosal edema or rhinorrhea. Right sinus exhibits no maxillary sinus tenderness and no frontal sinus tenderness. Left sinus exhibits no maxillary sinus tenderness and no frontal sinus tenderness.  Mouth/Throat: Uvula is midline, oropharynx is clear and moist and mucous membranes are normal.  Eyes: Conjunctivae, EOM and lids are normal. Pupils are equal, round, and reactive to light. No foreign bodies found.  Neck: Trachea normal and normal range of motion. Neck supple. Carotid bruit is not present. No mass and no thyromegaly present.  Cardiovascular: Normal rate, S1 normal, S2 normal, intact distal pulses and normal pulses.  An irregularly irregular rhythm present. Exam reveals distant heart sounds. Exam reveals no gallop and no friction rub.   No murmur heard. Pulmonary/Chest: Effort normal and breath sounds normal. Not tachypneic. No respiratory distress. She has no decreased breath sounds. She has no wheezes. She has no rhonchi. She has no rales. She exhibits no tenderness.  Abdominal: Soft. Normal appearance and bowel sounds are normal. There is no tenderness.  Neurological: She is alert and oriented to person, place, and time.  Skin: Skin is warm, dry and intact. No rash noted.  Psychiatric: Her speech is normal and behavior is normal. Judgment and thought content normal. Her mood appears not anxious. Cognition and memory are normal. She does not exhibit a depressed mood.  Assessment & Plan:

## 2012-01-29 NOTE — Assessment & Plan Note (Signed)
Stable

## 2012-01-29 NOTE — Assessment & Plan Note (Addendum)
Well controlled. Continue current medication. Re-eval creatinine prior to surgery.

## 2012-01-29 NOTE — Assessment & Plan Note (Signed)
Will eval with labs to clear her medically.  If cardiac clearance nml but chest tightness or dyspnea continues... Consider CXR, lung function tests to eval lungs further.

## 2012-01-29 NOTE — Assessment & Plan Note (Signed)
Some possible exertion symptoms, but none ongoing or in last few days... Has appt with cardiology next week. Will have EKG done there and possible stress testing needed prior to surgery.

## 2012-01-29 NOTE — Assessment & Plan Note (Signed)
Due for re-eval. Important to have under control for post op healing.

## 2012-02-01 ENCOUNTER — Other Ambulatory Visit (INDEPENDENT_AMBULATORY_CARE_PROVIDER_SITE_OTHER): Payer: Medicare Other

## 2012-02-01 DIAGNOSIS — Z01818 Encounter for other preprocedural examination: Secondary | ICD-10-CM

## 2012-02-01 DIAGNOSIS — E119 Type 2 diabetes mellitus without complications: Secondary | ICD-10-CM

## 2012-02-01 LAB — COMPREHENSIVE METABOLIC PANEL
AST: 28 U/L (ref 0–37)
Albumin: 3.4 g/dL — ABNORMAL LOW (ref 3.5–5.2)
Alkaline Phosphatase: 72 U/L (ref 39–117)
BUN: 21 mg/dL (ref 6–23)
Glucose, Bld: 127 mg/dL — ABNORMAL HIGH (ref 70–99)
Potassium: 4.5 mEq/L (ref 3.5–5.1)
Sodium: 143 mEq/L (ref 135–145)
Total Bilirubin: 0.8 mg/dL (ref 0.3–1.2)
Total Protein: 7.7 g/dL (ref 6.0–8.3)

## 2012-02-01 LAB — CBC WITH DIFFERENTIAL/PLATELET
Basophils Relative: 0.6 % (ref 0.0–3.0)
Eosinophils Absolute: 0.2 10*3/uL (ref 0.0–0.7)
Eosinophils Relative: 2.5 % (ref 0.0–5.0)
HCT: 34.4 % — ABNORMAL LOW (ref 36.0–46.0)
Lymphs Abs: 1.9 10*3/uL (ref 0.7–4.0)
MCHC: 33.3 g/dL (ref 30.0–36.0)
MCV: 84.6 fl (ref 78.0–100.0)
Monocytes Absolute: 0.5 10*3/uL (ref 0.1–1.0)
Neutrophils Relative %: 71.9 % (ref 43.0–77.0)
RBC: 4.07 Mil/uL (ref 3.87–5.11)
WBC: 9.7 10*3/uL (ref 4.5–10.5)

## 2012-02-04 ENCOUNTER — Ambulatory Visit (INDEPENDENT_AMBULATORY_CARE_PROVIDER_SITE_OTHER): Payer: Medicare Other | Admitting: Cardiology

## 2012-02-04 ENCOUNTER — Encounter: Payer: Self-pay | Admitting: Cardiology

## 2012-02-04 VITALS — BP 122/69 | HR 63 | Ht 66.0 in | Wt 182.1 lb

## 2012-02-04 DIAGNOSIS — I10 Essential (primary) hypertension: Secondary | ICD-10-CM | POA: Diagnosis not present

## 2012-02-04 DIAGNOSIS — Z01818 Encounter for other preprocedural examination: Secondary | ICD-10-CM | POA: Diagnosis not present

## 2012-02-04 DIAGNOSIS — E785 Hyperlipidemia, unspecified: Secondary | ICD-10-CM | POA: Diagnosis not present

## 2012-02-04 DIAGNOSIS — I2510 Atherosclerotic heart disease of native coronary artery without angina pectoris: Secondary | ICD-10-CM

## 2012-02-04 MED ORDER — ATORVASTATIN CALCIUM 20 MG PO TABS: 20.0000 mg | ORAL_TABLET | Freq: Every day | ORAL | Status: DC

## 2012-02-04 NOTE — Patient Instructions (Signed)
The current medical regimen is effective;  continue present plan and medications.  Your physician has requested that you have a lexiscan myoview. For further information please visit [email or web address]. Please follow instruction sheet, as given.

## 2012-02-04 NOTE — Assessment & Plan Note (Signed)
Her LDL remarkably has been 15.  I will reduce the Lipitor to 20 mg.

## 2012-02-04 NOTE — Assessment & Plan Note (Signed)
Given her chest pain, low functional status and previous history she will need preoperative testing prior to noncardiac surgery. She wouldn't be able to walk a treadmill so she will have a [employer].  Further comments on clearance will be based on this.

## 2012-02-04 NOTE — Assessment & Plan Note (Signed)
The blood pressure is at target. No change in medications is indicated. We will continue with therapeutic lifestyle changes (TLC).  

## 2012-02-04 NOTE — Progress Notes (Signed)
HPI The patient was previously followed by Dr. Haroldine Laws.  She has a history of coronary disease as described below. She is to undergo a second knee replacement on her right leg. She's being referred for preoperative clearance. Her last catheterization demonstrated patent LAD stent, 40% left main stenosis, 40% diagonal stenosis, 40% circumflex stenosis in 2010. She's been limited by knee pain. She still walks up a slight incline about 100 yards to get mail. She has been having some chest discomfort when she walks. This was happening a few months ago but not currently perhaps because she's not walking as quickly. It was mid chest discomfort similar to previous angina it would go away with rest. It was mild to moderate. There were no associated symptoms. She's not had any nausea vomiting or diaphoresis. She's had no new shortness of breath, PND or orthopnea.  Allergies  Allergen Reactions  . Amoxicillin-Pot Clavulanate     Current Outpatient Prescriptions  Medication Sig Dispense Refill  . aspirin 81 MG tablet Take 81 mg by mouth daily.      Marland Kitchen atorvastatin (LIPITOR) 80 MG tablet Take 1 tablet (80 mg total) by mouth daily.  30 tablet  11  . calcium-vitamin D (OSCAL WITH D 500-200) 500-200 MG-UNIT per tablet Take 1 tablet by mouth 2 (two) times daily.        . carvedilol (COREG) 12.5 MG tablet Take 1 tablet (12.5 mg total) by mouth 2 (two) times daily with a meal.  60 tablet  10  . cholestyramine light (PREVALITE) 4 G packet Take 1 packet (4 g total) by mouth 2 (two) times daily.  60 packet  11  . citalopram (CELEXA) 40 MG tablet Take 1 tablet (40 mg total) by mouth daily.  30 tablet  11  . diltiazem (CARTIA XT) 240 MG 24 hr capsule Take 1 capsule (240 mg total) by mouth daily.  30 capsule  11  . ferrous sulfate 325 (65 FE) MG EC tablet Take 1 tablet (325 mg total) by mouth 2 (two) times daily.  60 tablet  11  . Fish Oil OIL Take 3 capsules daily       . glyBURIDE-metformin (GLUCOVANCE) 1.25-250  MG per tablet TAKE 1 TABLET BY MOUTH TWICE A DAY  60 tablet  2  . hydrochlorothiazide (HYDRODIURIL) 25 MG tablet Take 1 tablet (25 mg total) by mouth daily.  30 tablet  11  . Multiple Vitamin (MULTIVITAMIN) tablet Take 1 tablet by mouth daily.        . nabumetone (RELAFEN) 750 MG tablet Take 1 tablet (750 mg total) by mouth 2 (two) times daily. Alternating taking 1 tab twice a day and on alternate days taking 1 tablet daily  60 tablet  11  . omeprazole (PRILOSEC) 20 MG capsule Take 2 capsules (40 mg total) by mouth daily.  180 capsule  2  . potassium chloride SA (K-DUR,KLOR-CON) 20 MEQ tablet Take 1 tablet (20 mEq total) by mouth 2 (two) times daily.  60 tablet  11  . cholestyramine light (PREVALITE) 4 GM/DOSE powder Take 1 packet (4 g total) by mouth 2 (two) times daily with a meal.  239.4 g  6    Past Medical History  Diagnosis Date  . Osteoarthritis   . Edema, peripheral   . CAD (coronary artery disease)     s/p BMS to LAD  . Breast pain     left  . Hyperlipidemia   . Hypertension   . Sinusitis  acute- NOS  . Allergic rhinitis   . Blood in stool   . Adenocarcinoma, colon   . Anemia   . Sciatica   . Cystitis, acute   . Chronic diarrhea   . Nausea   . GERD (gastroesophageal reflux disease)   . Depression   . Anxiety   . Osteoarthritis   . IBS (irritable bowel syndrome)   . Degenerative disk disease   . Carpal tunnel syndrome   . Diabetes mellitus, type 2   . Family history of diabetes mellitus     Past Surgical History  Procedure Date  . Knee arthroscopy     bilateral  . Cholecystectomy   . Cardiac catheterization 12/10    LAD stent patent. Insignificant CAD, otherwise EF 60-65%  . Partial colectomy 11/2008    right, for adenocarcinoma  . Replacement total knee 99991111    right, complicated by hemarthrosis   . Hematoma evacuation     after knee replacement    ROS:  As stated in the HPI and negative for all other systems.  PHYSICAL EXAM BP 122/69  Pulse 63   Ht 5\' 6"  (1.676 m)  Wt 182 lb 1.9 oz (82.609 kg)  BMI 29.39 kg/m2 GENERAL:  Well appearing HEENT:  Pupils equal round and reactive, fundi not visualized, oral mucosa unremarkable, dentures NECK:  No jugular venous distention, waveform within normal limits, carotid upstroke brisk and symmetric, no bruits, no thyromegaly LYMPHATICS:  No cervical, inguinal adenopathy LUNGS:  Clear to auscultation bilaterally BACK:  No CVA tenderness CHEST:  Unremarkable HEART:  PMI not displaced or sustained,S1 and S2 within normal limits, no S3, no S4, no clicks, no rubs, no murmurs ABD:  Flat, positive bowel sounds normal in frequency in pitch, no bruits, no rebound, no guarding, no midline pulsatile mass, no hepatomegaly, no splenomegaly EXT:  2 plus pulses throughout, mild right leg dema, no cyanosis no clubbing SKIN:  No rashes no nodules NEURO:  Cranial nerves II through XII grossly intact, motor grossly intact throughout PSYCH:  Cognitively intact, oriented to person place and time  EKG:  Sinus rhythm, rate 63, axis within normal limits, intervals within normal limits, no acute ST-T wave changes.  ASSESSMENT AND PLAN

## 2012-02-09 ENCOUNTER — Ambulatory Visit (HOSPITAL_COMMUNITY): Payer: Medicare Other | Attending: Cardiology | Admitting: Radiology

## 2012-02-09 VITALS — BP 143/68 | Ht 66.0 in | Wt 180.0 lb

## 2012-02-09 DIAGNOSIS — I10 Essential (primary) hypertension: Secondary | ICD-10-CM | POA: Insufficient documentation

## 2012-02-09 DIAGNOSIS — R0602 Shortness of breath: Secondary | ICD-10-CM | POA: Diagnosis not present

## 2012-02-09 DIAGNOSIS — E785 Hyperlipidemia, unspecified: Secondary | ICD-10-CM | POA: Diagnosis not present

## 2012-02-09 DIAGNOSIS — Z01810 Encounter for preprocedural cardiovascular examination: Secondary | ICD-10-CM | POA: Insufficient documentation

## 2012-02-09 DIAGNOSIS — I2510 Atherosclerotic heart disease of native coronary artery without angina pectoris: Secondary | ICD-10-CM | POA: Diagnosis not present

## 2012-02-09 DIAGNOSIS — R079 Chest pain, unspecified: Secondary | ICD-10-CM | POA: Insufficient documentation

## 2012-02-09 DIAGNOSIS — Z01818 Encounter for other preprocedural examination: Secondary | ICD-10-CM

## 2012-02-09 DIAGNOSIS — I4891 Unspecified atrial fibrillation: Secondary | ICD-10-CM | POA: Diagnosis not present

## 2012-02-09 DIAGNOSIS — R0609 Other forms of dyspnea: Secondary | ICD-10-CM | POA: Insufficient documentation

## 2012-02-09 DIAGNOSIS — E119 Type 2 diabetes mellitus without complications: Secondary | ICD-10-CM | POA: Diagnosis not present

## 2012-02-09 DIAGNOSIS — Z8249 Family history of ischemic heart disease and other diseases of the circulatory system: Secondary | ICD-10-CM | POA: Insufficient documentation

## 2012-02-09 DIAGNOSIS — R0989 Other specified symptoms and signs involving the circulatory and respiratory systems: Secondary | ICD-10-CM | POA: Insufficient documentation

## 2012-02-09 MED ORDER — REGADENOSON 0.4 MG/5ML IV SOLN
0.4000 mg | Freq: Once | INTRAVENOUS | Status: AC
  Administered 2012-02-09: 0.4 mg via INTRAVENOUS

## 2012-02-09 MED ORDER — TECHNETIUM TC 99M TETROFOSMIN IV KIT
33.0000 | PACK | Freq: Once | INTRAVENOUS | Status: AC | PRN
  Administered 2012-02-09: 33 via INTRAVENOUS

## 2012-02-09 MED ORDER — TECHNETIUM TC 99M TETROFOSMIN IV KIT
11.0000 | PACK | Freq: Once | INTRAVENOUS | Status: AC | PRN
  Administered 2012-02-09: 11 via INTRAVENOUS

## 2012-02-09 NOTE — Progress Notes (Signed)
[place] [address] [phone]  Cardiology Nuclear Med Study  Tonya [name] is a 71 y.o. female     MRN : [id number]     DOB: [birth year]  Procedure Date: 02/09/2012  Nuclear Med Background Indication for Stress Test:  Evaluation for Ischemia, Stent Patency, and pending Surgical Clearance for  (R) TKR on  by Dr. [name] History:  Afib, '07 Stents: LAD, 2010: ECHO: EF: 60-65%, 09-11-09 MPS: abn mild reversibile defect anterior suggestive of possible ischemia, 2010: Heart Cath: patent LAD stent Cardiac Risk Factors: Family History - CAD, Hypertension, Lipids and NIDDM  Symptoms:  Chest Pain, DOE and SOB   Nuclear Pre-Procedure Caffeine/Decaff Intake:  None> 12 hrs NPO After: 6:30pm   Lungs:  clear O2 Sat: 95% on room air. IV 0.9% NS with Angio Cath:  22g  IV Site: R Hand x 1, tolerated well IV Started by:  Irven Baltimore, RN  Chest Size (in):  38 Cup Size: B  Height: 5\' 6"  (1.676 m)  Weight:  180 lb (81.647 kg)  BMI:  Body mass index is 29.05 kg/(m^2). Tech Comments:  Took coreg this am, held glucovance this am    Nuclear Med Study 1 or 2 day study: 1 day  Stress Test Type:  Lexiscan  Reading MD: Loralie Champagne, MD  Order Authorizing Provider:  Minus Breeding, MD  Resting Radionuclide: Technetium 12m Tetrofosmin  Resting Radionuclide Dose: 11.0 mCi   Stress Radionuclide:  Technetium 66m Tetrofosmin  Stress Radionuclide Dose: 31.0 mCi           Stress Protocol Rest HR: 68 Stress HR: 79  Rest BP: 143/68 Stress BP: 147/69  Exercise Time (min): n/a METS: n/a   Predicted Max HR: 149 bpm % Max HR: 53.02 bpm Rate Pressure Product: 11613   Dose of Adenosine (mg):  n/a Dose of Lexiscan: 0.4 mg  Dose of Atropine (mg): n/a Dose of Dobutamine: n/a mcg/kg/min (at max HR)  Stress Test Technologist: Perrin Maltese, EMT-P  Nuclear Technologist:  Charlton Amor, CNMT     Rest Procedure:  Myocardial perfusion  imaging was performed at rest 45 minutes following the intravenous administration of Technetium 64m Tetrofosmin. Rest ECG: NSR - Normal EKG  Stress Procedure:  The patient received IV Lexiscan 0.4 mg over 15-seconds.  Technetium 46m Tetrofosmin injected at 30-seconds.  There were no significant changes, nausea, and occ pvcs with Lexiscan.  Quantitative spect images were obtained after a 45 minute delay. Stress ECG: No significant change from baseline ECG  QPS Raw Data Images:  Prominent breast shadow.  Stress Images:  Small, mild basal to mid anteroseptal perfusion defect.  Rest Images:  Small, mild basal to mid anteroseptal perfusion defect.  Subtraction (SDS):  Fixed small, mild basal to mid anteroseptal perfusion defect.  Transient Ischemic Dilatation (Normal <1.22):  1.09 Lung/Heart Ratio (Normal <0.45):  0.27  Quantitative Gated Spect Images QGS EDV:  95 ml QGS ESV:  28 ml  Impression Exercise Capacity:  Lexiscan with no exercise. BP Response:  Normal blood pressure response. Clinical Symptoms:  Nausea  ECG Impression:  No significant ST segment change suggestive of ischemia. Comparison with Prior Nuclear Study: No images to compare  Overall Impression:  Normal stress nuclear study.  There is breast attenuation that likely accounts for the mild fixed anteroseptal defect with normal motion.   LV Ejection Fraction: 71%.  LV Wall Motion:  NL LV Function; NL Wall Motion  Lacy Sofia [employer]

## 2012-02-12 ENCOUNTER — Telehealth: Payer: Self-pay | Admitting: Cardiology

## 2012-02-12 NOTE — Telephone Encounter (Signed)
Pt [phone] stress test results for surgical clearence

## 2012-02-12 NOTE — Telephone Encounter (Signed)
Pt aware of stress test results. Copy of results with Dr. Rosezella [place] surgical clearance note faxed to Dr. Durward Fortes. Horton Chin RN

## 2012-02-24 ENCOUNTER — Encounter: Payer: Medicare Other | Admitting: Physician Assistant

## 2012-02-24 DIAGNOSIS — M25569 Pain in unspecified knee: Secondary | ICD-10-CM | POA: Diagnosis not present

## 2012-02-25 ENCOUNTER — Telehealth: Payer: Self-pay

## 2012-02-25 NOTE — Telephone Encounter (Signed)
[employer] faxed drug utilization review form for Omeprazole which is in Dr Rometta Emery in box.

## 2012-02-26 ENCOUNTER — Inpatient Hospital Stay (HOSPITAL_COMMUNITY): Admission: RE | Admit: 2012-02-26 | Discharge: 2012-02-26 | Payer: Medicare Other | Source: Ambulatory Visit

## 2012-02-29 ENCOUNTER — Encounter (HOSPITAL_COMMUNITY): Payer: Self-pay | Admitting: Pharmacy Technician

## 2012-03-01 ENCOUNTER — Other Ambulatory Visit: Payer: Self-pay | Admitting: Gastroenterology

## 2012-03-02 ENCOUNTER — Encounter (HOSPITAL_COMMUNITY)
Admission: RE | Admit: 2012-03-02 | Discharge: 2012-03-02 | Disposition: A | Discharge status: Home / Self Care | Payer: Medicare Other | Source: Ambulatory Visit | Attending: Orthopedic Surgery | Admitting: Orthopedic Surgery

## 2012-03-02 ENCOUNTER — Encounter (HOSPITAL_COMMUNITY)
Admission: RE | Admit: 2012-03-02 | Discharge: 2012-03-02 | Disposition: A | Discharge status: Home / Self Care | Payer: Medicare Other | Source: Ambulatory Visit | Attending: Orthopaedic Surgery | Admitting: Orthopaedic Surgery

## 2012-03-02 DIAGNOSIS — Z01811 Encounter for preprocedural respiratory examination: Secondary | ICD-10-CM | POA: Diagnosis not present

## 2012-03-02 DIAGNOSIS — I517 Cardiomegaly: Secondary | ICD-10-CM | POA: Diagnosis not present

## 2012-03-02 LAB — CBC
Hemoglobin: 11.2 g/dL — ABNORMAL LOW (ref 12.0–15.0)
Platelets: 283 10*3/uL (ref 150–400)
RBC: 4.13 MIL/uL (ref 3.87–5.11)
WBC: 13 10*3/uL — ABNORMAL HIGH (ref 4.0–10.5)

## 2012-03-02 LAB — COMPREHENSIVE METABOLIC PANEL
ALT: 17 U/L (ref 0–35)
AST: 21 U/L (ref 0–37)
Alkaline Phosphatase: 85 U/L (ref 39–117)
CO2: 25 mEq/L (ref 19–32)
Calcium: 9.2 mg/dL (ref 8.4–10.5)
Chloride: 103 mEq/L (ref 96–112)
GFR calc non Af Amer: 60 mL/min — ABNORMAL LOW (ref >90)
Potassium: 4.4 mEq/L (ref 3.5–5.1)
Sodium: 137 mEq/L (ref 135–145)
Total Bilirubin: 0.3 mg/dL (ref 0.3–1.2)

## 2012-03-02 LAB — URINALYSIS, ROUTINE W REFLEX MICROSCOPIC
Protein, ur: NEGATIVE mg/dL
Urobilinogen, UA: 0.2 mg/dL (ref 0.0–1.0)

## 2012-03-02 LAB — DIFFERENTIAL
Basophils Absolute: 0.1 10*3/uL (ref 0.0–0.1)
Eosinophils Absolute: 0.2 10*3/uL (ref 0.0–0.7)
Eosinophils Relative: 2 % (ref 0–5)
Lymphocytes Relative: 23 % (ref 12–46)
Monocytes Absolute: 1 10*3/uL (ref 0.1–1.0)

## 2012-03-02 LAB — SURGICAL PCR SCREEN
MRSA, PCR: NEGATIVE
Staphylococcus aureus: NEGATIVE

## 2012-03-02 LAB — URINE MICROSCOPIC-ADD ON

## 2012-03-02 LAB — APTT: aPTT: 38 seconds — ABNORMAL HIGH (ref 24–37)

## 2012-03-02 NOTE — Pre-Procedure Instructions (Signed)
20 GRACIELA TICHY  03/02/2012   Your procedure is scheduled on:  June 11TH, TUESDAY  Report to [facility] at  8:30 AM.  Call this number if you have problems the morning of surgery: [phone]   Remember:   Do not eat food:After [place].  May have clear liquids: up to 4 Hours before arrival TIME--- 4:30 AM.  Clear liquids include soda, tea, black coffee, apple or grape juice, broth.   Take these medicines the morning of surgery with A SIP OF WATER: COREG, CELEXA, PRILOSEC,              TRAMADOL   Do not wear jewelry, make-up or nail polish.  Do not wear lotions, powders, or perfumes. You may wear deodorant.  Do not shave 48 hours prior to surgery. Men may shave face and neck.  Do not bring valuables to the hospital.   Contacts, dentures or bridgework may not be worn into surgery.  Leave suitcase in the car. After surgery it may be brought to your room.  For patients admitted to the hospital, checkout time is 11:00 AM the day of discharge.   Patients discharged the day of surgery will not be allowed to drive home.  Name and phone number of your driver: JULIE HOUCK  --  DTR   [phone]   Special Instructions: CHG Shower Use Special Wash: 1/2 bottle night before surgery and 1/2 bottle morning of surgery.   Please read over the following fact sheets that you were given: Pain Booklet, Coughing and Deep Breathing, Blood Transfusion Information, MRSA Information and Surgical Site Infection Prevention

## 2012-03-03 ENCOUNTER — Other Ambulatory Visit: Payer: Self-pay | Admitting: *Deleted

## 2012-03-03 LAB — URINE CULTURE

## 2012-03-03 MED ORDER — POTASSIUM CHLORIDE CRYS ER 20 MEQ PO TBCR: 20.0000 meq | EXTENDED_RELEASE_TABLET | Freq: Two times a day (BID) | ORAL | Status: DC

## 2012-03-03 MED ORDER — CARVEDILOL 12.5 MG PO TABS: 12.5000 mg | ORAL_TABLET | Freq: Two times a day (BID) | ORAL | Status: DC

## 2012-03-03 MED ORDER — HYDROCHLOROTHIAZIDE 25 MG PO TABS: 25.0000 mg | ORAL_TABLET | Freq: Every day | ORAL | Status: DC

## 2012-03-03 MED ORDER — CITALOPRAM HYDROBROMIDE 40 MG PO TABS: 40.0000 mg | ORAL_TABLET | Freq: Every day | ORAL | Status: DC

## 2012-03-03 MED ORDER — DILTIAZEM HCL ER COATED BEADS 240 MG PO CP24: 240.0000 mg | ORAL_CAPSULE | Freq: Every day | ORAL | Status: DC

## 2012-03-03 MED ORDER — GLYBURIDE-METFORMIN 1.25-250 MG PO TABS: 1.0000 | ORAL_TABLET | Freq: Two times a day (BID) | ORAL | Status: DC

## 2012-03-03 NOTE — Consult Note (Signed)
Anesthesia Chart Review:  Patient is a 71 year old female scheduled for right TK revision versus removal and placement of antibiotic spacer on 03/08/12.  History includes non-smoker, CAD s/p LAD BMS, HLD, OA, anemia, depression, DM2, peripheral edema, HTN, colon CA s/p partial colectomy, GERD, anxiety, IBS, right TKA complicated by hemarthrosis 04/2010.  PCP is Dr. [name].  Her Cardiologist is Dr. Percival Spanish (previously followed by Dr. Haroldine Laws).  She was seen for pre-operative evaluation on 02/04/12.  EKG then showed NSR, septal infarct (age undetermined).  He recommended a stress test which was normal (see below), and he felt she was acceptable risk.  Nuclear stress test on 02/09/12 showed: Normal stress nuclear study. There is breast attenuation that likely accounts for the mild fixed anteroseptal defect with normal motion. LV Ejection Fraction: 71%. LV Wall Motion: NL LV Function; NL Wall Motion.  Echo on 11/01/08 showed: Overall left ventricular systolic function was normal. Left ventricular ejection fraction was estimated , range being 60 % to 65 %. There were no left ventricular regional wall motion abnormalities.  Trivial MR/TR/PR.  Cardiac cath on 09/17/09 showed patent LAD stent, otherwise non-obstructive CAD (40% left main stenosis, 40% diagonal stenosis, 40% circumflex stenosis), normal LV function, normal right-sided pressures.    CXR on 03/02/12 showed no evidence of acute cardiopulmonary disease. Stable mild cardiomegaly.    Labs noted.  Cr 1.2, Glucose 127, WBC 13.0, H/H 11.5/34.4, PTT 38, PT/INR WNL.  T&S done.  Urine culture is still pending.  Labs acceptable from an Anesthesia standpoint.  Myra Gianotti, PA-C

## 2012-03-07 MED ORDER — CHLORHEXIDINE GLUCONATE 4 % EX LIQD: 60.0000 mL | Freq: Once | CUTANEOUS | Status: DC

## 2012-03-07 MED ORDER — ACETAMINOPHEN 10 MG/ML IV SOLN
1000.0000 mg | Freq: Once | INTRAVENOUS | Status: DC
  Filled 2012-03-07: qty 100

## 2012-03-07 MED ORDER — SODIUM CHLORIDE 0.9 % IV SOLN: INTRAVENOUS | Status: DC

## 2012-03-07 MED ORDER — CHLORHEXIDINE GLUCONATE 4 % EX LIQD: 60.0000 mL | Freq: Every day | CUTANEOUS | Status: DC
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
## 2012-03-08 ENCOUNTER — Ambulatory Visit (HOSPITAL_COMMUNITY): Payer: Medicare Other | Admitting: Vascular Surgery

## 2012-03-08 ENCOUNTER — Encounter (HOSPITAL_COMMUNITY): Payer: Self-pay | Admitting: *Deleted

## 2012-03-08 ENCOUNTER — Encounter (HOSPITAL_COMMUNITY): Payer: Self-pay | Admitting: Vascular Surgery

## 2012-03-08 ENCOUNTER — Encounter (HOSPITAL_COMMUNITY)
Admission: RE | Disposition: A | Discharge status: Skilled Nursing Facility | Payer: Self-pay | Source: Ambulatory Visit | Attending: Orthopaedic Surgery

## 2012-03-08 ENCOUNTER — Inpatient Hospital Stay (HOSPITAL_COMMUNITY)
Admission: RE | Admit: 2012-03-08 | Discharge: 2012-03-11 | DRG: 464 | Disposition: A | Discharge status: Skilled Nursing Facility | Payer: Medicare Other | Source: Ambulatory Visit | Attending: Orthopaedic Surgery | Admitting: Orthopaedic Surgery

## 2012-03-08 DIAGNOSIS — Z833 Family history of diabetes mellitus: Secondary | ICD-10-CM

## 2012-03-08 DIAGNOSIS — T8450XA Infection and inflammatory reaction due to unspecified internal joint prosthesis, initial encounter: Secondary | ICD-10-CM | POA: Diagnosis not present

## 2012-03-08 DIAGNOSIS — F341 Dysthymic disorder: Secondary | ICD-10-CM | POA: Diagnosis present

## 2012-03-08 DIAGNOSIS — L0291 Cutaneous abscess, unspecified: Secondary | ICD-10-CM | POA: Diagnosis not present

## 2012-03-08 DIAGNOSIS — Y92009 Unspecified place in unspecified non-institutional (private) residence as the place of occurrence of the external cause: Secondary | ICD-10-CM

## 2012-03-08 DIAGNOSIS — Z9861 Coronary angioplasty status: Secondary | ICD-10-CM

## 2012-03-08 DIAGNOSIS — Z5189 Encounter for other specified aftercare: Secondary | ICD-10-CM | POA: Diagnosis not present

## 2012-03-08 DIAGNOSIS — T847XXA Infection and inflammatory reaction due to other internal orthopedic prosthetic devices, implants and grafts, initial encounter: Secondary | ICD-10-CM | POA: Diagnosis not present

## 2012-03-08 DIAGNOSIS — T8459XA Infection and inflammatory reaction due to other internal joint prosthesis, initial encounter: Secondary | ICD-10-CM

## 2012-03-08 DIAGNOSIS — E119 Type 2 diabetes mellitus without complications: Secondary | ICD-10-CM | POA: Diagnosis present

## 2012-03-08 DIAGNOSIS — M1990 Unspecified osteoarthritis, unspecified site: Secondary | ICD-10-CM | POA: Diagnosis not present

## 2012-03-08 DIAGNOSIS — G8918 Other acute postprocedural pain: Secondary | ICD-10-CM | POA: Diagnosis not present

## 2012-03-08 DIAGNOSIS — E785 Hyperlipidemia, unspecified: Secondary | ICD-10-CM | POA: Diagnosis present

## 2012-03-08 DIAGNOSIS — K589 Irritable bowel syndrome without diarrhea: Secondary | ICD-10-CM | POA: Diagnosis present

## 2012-03-08 DIAGNOSIS — Z96659 Presence of unspecified artificial knee joint: Secondary | ICD-10-CM | POA: Diagnosis not present

## 2012-03-08 DIAGNOSIS — Z79899 Other long term (current) drug therapy: Secondary | ICD-10-CM

## 2012-03-08 DIAGNOSIS — K219 Gastro-esophageal reflux disease without esophagitis: Secondary | ICD-10-CM | POA: Diagnosis present

## 2012-03-08 DIAGNOSIS — Z85038 Personal history of other malignant neoplasm of large intestine: Secondary | ICD-10-CM | POA: Diagnosis not present

## 2012-03-08 DIAGNOSIS — Z8249 Family history of ischemic heart disease and other diseases of the circulatory system: Secondary | ICD-10-CM | POA: Diagnosis not present

## 2012-03-08 DIAGNOSIS — R609 Edema, unspecified: Secondary | ICD-10-CM | POA: Diagnosis not present

## 2012-03-08 DIAGNOSIS — I10 Essential (primary) hypertension: Secondary | ICD-10-CM | POA: Diagnosis present

## 2012-03-08 DIAGNOSIS — Y831 Surgical operation with implant of artificial internal device as the cause of abnormal reaction of the patient, or of later complication, without mention of misadventure at the time of the procedure: Secondary | ICD-10-CM | POA: Diagnosis present

## 2012-03-08 DIAGNOSIS — D649 Anemia, unspecified: Secondary | ICD-10-CM | POA: Diagnosis not present

## 2012-03-08 DIAGNOSIS — N644 Mastodynia: Secondary | ICD-10-CM | POA: Diagnosis not present

## 2012-03-08 DIAGNOSIS — M25569 Pain in unspecified knee: Secondary | ICD-10-CM | POA: Diagnosis not present

## 2012-03-08 DIAGNOSIS — D62 Acute posthemorrhagic anemia: Secondary | ICD-10-CM | POA: Diagnosis not present

## 2012-03-08 DIAGNOSIS — D2120 Benign neoplasm of connective and other soft tissue of unspecified lower limb, including hip: Secondary | ICD-10-CM | POA: Diagnosis not present

## 2012-03-08 DIAGNOSIS — T84039A Mechanical loosening of unspecified internal prosthetic joint, initial encounter: Secondary | ICD-10-CM | POA: Diagnosis present

## 2012-03-08 DIAGNOSIS — Z01812 Encounter for preprocedural laboratory examination: Secondary | ICD-10-CM | POA: Diagnosis not present

## 2012-03-08 DIAGNOSIS — J309 Allergic rhinitis, unspecified: Secondary | ICD-10-CM | POA: Diagnosis not present

## 2012-03-08 DIAGNOSIS — D509 Iron deficiency anemia, unspecified: Secondary | ICD-10-CM | POA: Diagnosis present

## 2012-03-08 DIAGNOSIS — J019 Acute sinusitis, unspecified: Secondary | ICD-10-CM | POA: Diagnosis not present

## 2012-03-08 DIAGNOSIS — K921 Melena: Secondary | ICD-10-CM | POA: Diagnosis not present

## 2012-03-08 DIAGNOSIS — C189 Malignant neoplasm of colon, unspecified: Secondary | ICD-10-CM | POA: Diagnosis not present

## 2012-03-08 DIAGNOSIS — S99919A Unspecified injury of unspecified ankle, initial encounter: Secondary | ICD-10-CM | POA: Diagnosis not present

## 2012-03-08 DIAGNOSIS — I2510 Atherosclerotic heart disease of native coronary artery without angina pectoris: Secondary | ICD-10-CM | POA: Diagnosis present

## 2012-03-08 HISTORY — PX: TOTAL KNEE REVISION: SHX996

## 2012-03-08 LAB — GRAM STAIN

## 2012-03-08 LAB — GLUCOSE, CAPILLARY: Glucose-Capillary: 150 mg/dL — ABNORMAL HIGH (ref 70–99)

## 2012-03-08 SURGERY — TOTAL KNEE REVISION
Anesthesia: Regional | Site: Knee | Laterality: Right | Wound class: Dirty or Infected

## 2012-03-08 MED ORDER — BUPIVACAINE-EPINEPHRINE PF 0.5-1:200000 % IJ SOLN
INTRAMUSCULAR | Status: DC | PRN
  Administered 2012-03-08: 22 mL

## 2012-03-08 MED ORDER — MAGNESIUM HYDROXIDE 400 MG/5ML PO SUSP: 30.0000 mL | Freq: Every day | ORAL | Status: DC | PRN

## 2012-03-08 MED ORDER — VANCOMYCIN HCL 1000 MG IV SOLR
INTRAVENOUS | Status: DC | PRN
  Administered 2012-03-08: 1000 mg

## 2012-03-08 MED ORDER — BISACODYL 10 MG RE SUPP: 10.0000 mg | Freq: Every day | RECTAL | Status: DC | PRN

## 2012-03-08 MED ORDER — LACTATED RINGERS IV SOLN
INTRAVENOUS | Status: DC | PRN
  Administered 2012-03-08 (×2): via INTRAVENOUS

## 2012-03-08 MED ORDER — HYDROMORPHONE HCL PF 1 MG/ML IJ SOLN
0.2500 mg | INTRAMUSCULAR | Status: DC | PRN
  Administered 2012-03-08 (×2): 0.25 mg via INTRAVENOUS

## 2012-03-08 MED ORDER — NEOSTIGMINE METHYLSULFATE 1 MG/ML IJ SOLN
INTRAMUSCULAR | Status: DC | PRN
  Administered 2012-03-08: 2 mg via INTRAVENOUS

## 2012-03-08 MED ORDER — BUPIVACAINE-EPINEPHRINE PF 0.25-1:200000 % IJ SOLN
INTRAMUSCULAR | Status: DC | PRN
  Administered 2012-03-08: 30 mL

## 2012-03-08 MED ORDER — INSULIN ASPART 100 UNIT/ML ~~LOC~~ SOLN: 0.0000 [IU] | Freq: Every day | SUBCUTANEOUS | Status: DC

## 2012-03-08 MED ORDER — CHOLESTYRAMINE LIGHT 4 G PO PACK
4.0000 g | PACK | Freq: Two times a day (BID) | ORAL | Status: DC
  Administered 2012-03-08 – 2012-03-11 (×6): 4 g via ORAL
  Filled 2012-03-08 (×7): qty 1

## 2012-03-08 MED ORDER — ONDANSETRON HCL 4 MG/2ML IJ SOLN: 4.0000 mg | Freq: Once | INTRAMUSCULAR | Status: DC | PRN

## 2012-03-08 MED ORDER — FERROUS SULFATE 325 (65 FE) MG PO TABS
325.0000 mg | ORAL_TABLET | Freq: Two times a day (BID) | ORAL | Status: DC
  Administered 2012-03-09 – 2012-03-10 (×3): 325 mg via ORAL
  Filled 2012-03-08 (×8): qty 1

## 2012-03-08 MED ORDER — RIVAROXABAN 10 MG PO TABS
10.0000 mg | ORAL_TABLET | Freq: Every day | ORAL | Status: DC
  Administered 2012-03-09 – 2012-03-11 (×3): 10 mg via ORAL
  Filled 2012-03-08 (×3): qty 1

## 2012-03-08 MED ORDER — METOCLOPRAMIDE HCL 10 MG PO TABS: 5.0000 mg | ORAL_TABLET | Freq: Three times a day (TID) | ORAL | Status: DC | PRN

## 2012-03-08 MED ORDER — HYDROMORPHONE HCL PF 1 MG/ML IJ SOLN
0.5000 mg | INTRAMUSCULAR | Status: DC | PRN
  Administered 2012-03-08: 0.5 mg via INTRAVENOUS
  Filled 2012-03-08: qty 1

## 2012-03-08 MED ORDER — METHOCARBAMOL 500 MG PO TABS
500.0000 mg | ORAL_TABLET | Freq: Four times a day (QID) | ORAL | Status: DC | PRN
  Administered 2012-03-08 – 2012-03-11 (×4): 500 mg via ORAL
  Filled 2012-03-08 (×4): qty 1

## 2012-03-08 MED ORDER — SENNA 8.6 MG PO TABS
1.0000 | ORAL_TABLET | Freq: Two times a day (BID) | ORAL | Status: DC
  Administered 2012-03-08 – 2012-03-11 (×6): 8.6 mg via ORAL
  Filled 2012-03-08 (×7): qty 1

## 2012-03-08 MED ORDER — METOCLOPRAMIDE HCL 5 MG/ML IJ SOLN: 5.0000 mg | Freq: Three times a day (TID) | INTRAMUSCULAR | Status: DC | PRN

## 2012-03-08 MED ORDER — SODIUM CHLORIDE 0.9 % IV SOLN
INTRAVENOUS | Status: DC
  Administered 2012-03-08: 14:00:00 via INTRAVENOUS

## 2012-03-08 MED ORDER — FENTANYL CITRATE 0.05 MG/ML IJ SOLN
50.0000 ug | INTRAMUSCULAR | Status: DC | PRN
  Administered 2012-03-08 (×2): 50 ug via INTRAVENOUS

## 2012-03-08 MED ORDER — FERROUS SULFATE 325 (65 FE) MG PO TBEC: 325.0000 mg | DELAYED_RELEASE_TABLET | Freq: Two times a day (BID) | ORAL | Status: DC

## 2012-03-08 MED ORDER — PHENOL 1.4 % MT LIQD: 1.0000 | OROMUCOSAL | Status: DC | PRN

## 2012-03-08 MED ORDER — FENTANYL CITRATE 0.05 MG/ML IJ SOLN
INTRAMUSCULAR | Status: DC | PRN
  Administered 2012-03-08: 100 ug via INTRAVENOUS
  Administered 2012-03-08: 50 ug via INTRAVENOUS
  Administered 2012-03-08: 100 ug via INTRAVENOUS
  Administered 2012-03-08 (×2): 50 ug via INTRAVENOUS

## 2012-03-08 MED ORDER — MINERAL OIL LIGHT 100 % EX OIL
TOPICAL_OIL | CUTANEOUS | Status: DC | PRN
  Administered 2012-03-08: 1 via TOPICAL

## 2012-03-08 MED ORDER — SODIUM CHLORIDE 0.9 % IR SOLN
Status: DC | PRN
  Administered 2012-03-08: 1000 mL

## 2012-03-08 MED ORDER — FENTANYL CITRATE 0.05 MG/ML IJ SOLN
INTRAMUSCULAR | Status: AC
  Filled 2012-03-08: qty 2

## 2012-03-08 MED ORDER — HYDROCHLOROTHIAZIDE 25 MG PO TABS
25.0000 mg | ORAL_TABLET | Freq: Every day | ORAL | Status: DC
  Administered 2012-03-08 – 2012-03-11 (×4): 25 mg via ORAL
  Filled 2012-03-08 (×4): qty 1

## 2012-03-08 MED ORDER — POTASSIUM CHLORIDE CRYS ER 20 MEQ PO TBCR
EXTENDED_RELEASE_TABLET | ORAL | Status: AC
  Filled 2012-03-08: qty 1

## 2012-03-08 MED ORDER — MENTHOL 3 MG MT LOZG: 1.0000 | LOZENGE | OROMUCOSAL | Status: DC | PRN

## 2012-03-08 MED ORDER — CITALOPRAM HYDROBROMIDE 40 MG PO TABS
40.0000 mg | ORAL_TABLET | Freq: Every day | ORAL | Status: DC
  Administered 2012-03-09 – 2012-03-11 (×3): 40 mg via ORAL
  Filled 2012-03-08 (×3): qty 1

## 2012-03-08 MED ORDER — METHOCARBAMOL 100 MG/ML IJ SOLN
500.0000 mg | Freq: Four times a day (QID) | INTRAVENOUS | Status: DC | PRN
  Filled 2012-03-08: qty 5

## 2012-03-08 MED ORDER — ONDANSETRON HCL 4 MG/2ML IJ SOLN
INTRAMUSCULAR | Status: DC | PRN
  Administered 2012-03-08: 4 mg via INTRAVENOUS

## 2012-03-08 MED ORDER — LACTATED RINGERS IV SOLN
INTRAVENOUS | Status: DC
  Administered 2012-03-08: 09:00:00 via INTRAVENOUS

## 2012-03-08 MED ORDER — VANCOMYCIN HCL 1000 MG IV SOLR
750.0000 mg | Freq: Two times a day (BID) | INTRAVENOUS | Status: DC
  Administered 2012-03-08 – 2012-03-11 (×4): 750 mg via INTRAVENOUS
  Filled 2012-03-08 (×7): qty 750

## 2012-03-08 MED ORDER — GLYBURIDE 1.25 MG PO TABS
1.2500 mg | ORAL_TABLET | Freq: Two times a day (BID) | ORAL | Status: DC
  Administered 2012-03-08 – 2012-03-11 (×5): 1.25 mg via ORAL
  Filled 2012-03-08 (×8): qty 1

## 2012-03-08 MED ORDER — ACETAMINOPHEN 10 MG/ML IV SOLN
1000.0000 mg | Freq: Four times a day (QID) | INTRAVENOUS | Status: AC
  Administered 2012-03-08 – 2012-03-09 (×4): 1000 mg via INTRAVENOUS
  Filled 2012-03-08 (×4): qty 100

## 2012-03-08 MED ORDER — CARVEDILOL 12.5 MG PO TABS
12.5000 mg | ORAL_TABLET | Freq: Two times a day (BID) | ORAL | Status: DC
  Administered 2012-03-08 – 2012-03-11 (×6): 12.5 mg via ORAL
  Filled 2012-03-08 (×8): qty 1

## 2012-03-08 MED ORDER — OXYCODONE HCL 5 MG PO TABS
5.0000 mg | ORAL_TABLET | ORAL | Status: DC | PRN
  Administered 2012-03-08 – 2012-03-09 (×4): 10 mg via ORAL
  Administered 2012-03-10: 5 mg via ORAL
  Administered 2012-03-10 – 2012-03-11 (×3): 10 mg via ORAL
  Filled 2012-03-08 (×5): qty 2
  Filled 2012-03-08: qty 1
  Filled 2012-03-08 (×2): qty 2

## 2012-03-08 MED ORDER — PANTOPRAZOLE SODIUM 40 MG PO TBEC
40.0000 mg | DELAYED_RELEASE_TABLET | Freq: Every day | ORAL | Status: DC
  Administered 2012-03-09 – 2012-03-11 (×2): 40 mg via ORAL
  Filled 2012-03-08 (×3): qty 1

## 2012-03-08 MED ORDER — PROPOFOL 10 MG/ML IV BOLUS
INTRAVENOUS | Status: DC | PRN
  Administered 2012-03-08: 160 mg via INTRAVENOUS

## 2012-03-08 MED ORDER — DILTIAZEM HCL ER COATED BEADS 240 MG PO CP24
240.0000 mg | ORAL_CAPSULE | Freq: Every day | ORAL | Status: DC
  Administered 2012-03-08 – 2012-03-11 (×4): 240 mg via ORAL
  Filled 2012-03-08 (×4): qty 1

## 2012-03-08 MED ORDER — CEFAZOLIN SODIUM 1-5 GM-% IV SOLN
INTRAVENOUS | Status: DC | PRN
  Administered 2012-03-08: 1 g via INTRAVENOUS

## 2012-03-08 MED ORDER — HYDROMORPHONE HCL PF 1 MG/ML IJ SOLN
INTRAMUSCULAR | Status: AC
  Filled 2012-03-08: qty 1

## 2012-03-08 MED ORDER — ONDANSETRON HCL 4 MG PO TABS: 4.0000 mg | ORAL_TABLET | Freq: Four times a day (QID) | ORAL | Status: DC | PRN

## 2012-03-08 MED ORDER — ATORVASTATIN CALCIUM 20 MG PO TABS
20.0000 mg | ORAL_TABLET | Freq: Every day | ORAL | Status: DC
  Administered 2012-03-08 – 2012-03-10 (×3): 20 mg via ORAL
  Filled 2012-03-08 (×4): qty 1

## 2012-03-08 MED ORDER — ALUM & MAG HYDROXIDE-SIMETH 200-200-20 MG/5ML PO SUSP: 30.0000 mL | ORAL | Status: DC | PRN

## 2012-03-08 MED ORDER — CEFAZOLIN SODIUM 1-5 GM-% IV SOLN
INTRAVENOUS | Status: AC
  Filled 2012-03-08: qty 50

## 2012-03-08 MED ORDER — INSULIN ASPART 100 UNIT/ML ~~LOC~~ SOLN
0.0000 [IU] | Freq: Three times a day (TID) | SUBCUTANEOUS | Status: DC
  Administered 2012-03-09: 3 [IU] via SUBCUTANEOUS
  Administered 2012-03-09 (×2): 2 [IU] via SUBCUTANEOUS
  Administered 2012-03-10 – 2012-03-11 (×2): 3 [IU] via SUBCUTANEOUS
  Administered 2012-03-11: 2 [IU] via SUBCUTANEOUS

## 2012-03-08 MED ORDER — SODIUM CHLORIDE 0.9 % IR SOLN
Status: DC | PRN
  Administered 2012-03-08: 12:00:00

## 2012-03-08 MED ORDER — POTASSIUM CHLORIDE CRYS ER 20 MEQ PO TBCR
20.0000 meq | EXTENDED_RELEASE_TABLET | Freq: Two times a day (BID) | ORAL | Status: DC
  Administered 2012-03-08 – 2012-03-11 (×6): 20 meq via ORAL
  Filled 2012-03-08 (×6): qty 1

## 2012-03-08 MED ORDER — FLEET ENEMA 7-19 GM/118ML RE ENEM: 1.0000 | ENEMA | Freq: Once | RECTAL | Status: AC | PRN

## 2012-03-08 MED ORDER — ROCURONIUM BROMIDE 100 MG/10ML IV SOLN
INTRAVENOUS | Status: DC | PRN
  Administered 2012-03-08: 40 mg via INTRAVENOUS

## 2012-03-08 MED ORDER — KETOROLAC TROMETHAMINE 30 MG/ML IJ SOLN
INTRAMUSCULAR | Status: AC
  Filled 2012-03-08: qty 1

## 2012-03-08 MED ORDER — TOBRAMYCIN SULFATE 1.2 G IJ SOLR
INTRAMUSCULAR | Status: DC | PRN
  Administered 2012-03-08: 1.2 g

## 2012-03-08 MED ORDER — KETOROLAC TROMETHAMINE 15 MG/ML IJ SOLN
7.5000 mg | Freq: Four times a day (QID) | INTRAMUSCULAR | Status: DC
  Administered 2012-03-08: 7.5 mg via INTRAVENOUS

## 2012-03-08 MED ORDER — ONDANSETRON HCL 4 MG/2ML IJ SOLN
4.0000 mg | Freq: Four times a day (QID) | INTRAMUSCULAR | Status: DC | PRN
  Administered 2012-03-11: 4 mg via INTRAVENOUS
  Filled 2012-03-08: qty 2

## 2012-03-08 MED ORDER — AZTREONAM 2 G IJ SOLR
2.0000 g | Freq: Four times a day (QID) | INTRAMUSCULAR | Status: DC
  Filled 2012-03-08 (×4): qty 2

## 2012-03-08 MED ORDER — ACETAMINOPHEN 10 MG/ML IV SOLN
INTRAVENOUS | Status: AC
  Filled 2012-03-08: qty 100

## 2012-03-08 MED ORDER — DEXTROSE 5 % IV SOLN
1.0000 g | Freq: Three times a day (TID) | INTRAVENOUS | Status: DC
  Administered 2012-03-08 – 2012-03-10 (×6): 1 g via INTRAVENOUS
  Filled 2012-03-08 (×7): qty 1

## 2012-03-08 MED ORDER — LIDOCAINE HCL (CARDIAC) 20 MG/ML IV SOLN
INTRAVENOUS | Status: DC | PRN
  Administered 2012-03-08: 100 mg via INTRAVENOUS

## 2012-03-08 MED ORDER — GLYCOPYRROLATE 0.2 MG/ML IJ SOLN
INTRAMUSCULAR | Status: DC | PRN
  Administered 2012-03-08: .3 mg via INTRAVENOUS

## 2012-03-08 SURGICAL SUPPLY — 57 items
BANDAGE ESMARK 6X9 LF (GAUZE/BANDAGES/DRESSINGS) ×1 IMPLANT
BLADE CORE FAN STRYKER (BLADE) ×1 IMPLANT
BLADE SAGITTAL 25.0X1.19X90 (BLADE) ×2 IMPLANT
BNDG CMPR 9X6 STRL LF SNTH (GAUZE/BANDAGES/DRESSINGS) ×1
BNDG ESMARK 6X9 LF (GAUZE/BANDAGES/DRESSINGS) ×2
BOWL SMART MIX CTS (DISPOSABLE) ×2 IMPLANT
CEMENT HV SMART SET (Cement) ×5 IMPLANT
CLOTH BEACON ORANGE TIMEOUT ST (SAFETY) ×2 IMPLANT
CONT SPEC 4OZ CLIKSEAL STRL BL (MISCELLANEOUS) ×1 IMPLANT
COVER SURGICAL LIGHT HANDLE (MISCELLANEOUS) ×3 IMPLANT
CUFF TOURNIQUET SINGLE 34IN LL (TOURNIQUET CUFF) ×1 IMPLANT
CUFF TOURNIQUET SINGLE 44IN (TOURNIQUET CUFF) IMPLANT
DRAPE EXTREMITY T 121X128X90 (DRAPE) ×2 IMPLANT
DRSG ADAPTIC 3X8 NADH LF (GAUZE/BANDAGES/DRESSINGS) ×2 IMPLANT
DRSG PAD ABDOMINAL 8X10 ST (GAUZE/BANDAGES/DRESSINGS) ×3 IMPLANT
DURAPREP 26ML APPLICATOR (WOUND CARE) ×2 IMPLANT
ELECT REM PT RETURN 9FT ADLT (ELECTROSURGICAL) ×2
ELECTRODE REM PT RTRN 9FT ADLT (ELECTROSURGICAL) ×1 IMPLANT
EVACUATOR 1/8 PVC DRAIN (DRAIN) ×1 IMPLANT
FACESHIELD LNG OPTICON STERILE (SAFETY) ×4 IMPLANT
GLOVE BIOGEL PI IND STRL 8 (GLOVE) ×1 IMPLANT
GLOVE BIOGEL PI IND STRL 8.5 (GLOVE) IMPLANT
GLOVE BIOGEL PI INDICATOR 8 (GLOVE) ×1
GLOVE BIOGEL PI INDICATOR 8.5 (GLOVE) ×1
GLOVE ECLIPSE 8.0 STRL XLNG CF (GLOVE) ×2 IMPLANT
GOWN PREVENTION PLUS XLARGE (GOWN DISPOSABLE) ×3 IMPLANT
GOWN STRL NON-REIN LRG LVL3 (GOWN DISPOSABLE) ×4 IMPLANT
HANDPIECE INTERPULSE COAX TIP (DISPOSABLE) ×2
INSERT TIB LCS RP STD+ 10 (Knees) ×1 IMPLANT
KIT BASIN OR (CUSTOM PROCEDURE TRAY) ×2 IMPLANT
KIT ROOM TURNOVER OR (KITS) ×2 IMPLANT
KIT STIMULAN RAPID CURE  10CC (Orthopedic Implant) ×1 IMPLANT
KIT STIMULAN RAPID CURE 10CC (Orthopedic Implant) IMPLANT
MANIFOLD NEPTUNE II (INSTRUMENTS) ×2 IMPLANT
NEEDLE 22X1 1/2 (OR ONLY) (NEEDLE) ×1 IMPLANT
NS IRRIG 1000ML POUR BTL (IV SOLUTION) ×2 IMPLANT
PACK TOTAL JOINT (CUSTOM PROCEDURE TRAY) ×2 IMPLANT
PAD ARMBOARD 7.5X6 YLW CONV (MISCELLANEOUS) ×4 IMPLANT
PAD CAST 4YDX4 CTTN HI CHSV (CAST SUPPLIES) ×1 IMPLANT
PADDING CAST COTTON 4X4 STRL (CAST SUPPLIES) ×2
PADDING CAST COTTON 6X4 STRL (CAST SUPPLIES) ×1 IMPLANT
SET HNDPC FAN SPRY TIP SCT (DISPOSABLE) ×1 IMPLANT
SPONGE GAUZE 4X4 12PLY (GAUZE/BANDAGES/DRESSINGS) ×2 IMPLANT
STAPLER VISISTAT 35W (STAPLE) ×2 IMPLANT
SUCTION FRAZIER TIP 10 FR DISP (SUCTIONS) ×2 IMPLANT
SUT BONE WAX W31G (SUTURE) ×2 IMPLANT
SUT ETHIBOND NAB CT1 #1 30IN (SUTURE) ×8 IMPLANT
SUT MNCRL AB 3-0 PS2 18 (SUTURE) ×3 IMPLANT
SUT PDS AB 0 CT 36 (SUTURE) ×2 IMPLANT
SUT PDS AB 1 CT  36 (SUTURE) ×1
SUT PDS AB 1 CT 36 (SUTURE) IMPLANT
SUT VIC AB 0 CT1 27 (SUTURE) ×2
SUT VIC AB 0 CT1 27XBRD ANBCTR (SUTURE) ×1 IMPLANT
SUT VIC AB 2-0 FS1 27 (SUTURE) ×4 IMPLANT
SYR CONTROL 10ML LL (SYRINGE) ×1 IMPLANT
TRAY FOLEY CATH 14FR (SET/KITS/TRAYS/PACK) ×2 IMPLANT
WATER STERILE IRR 1000ML POUR (IV SOLUTION) ×3 IMPLANT

## 2012-03-08 NOTE — Op Note (Signed)
NAMETREESA, GRATE NO.:  [id number]  MEDICAL RECORD NO.:  [id number]  LOCATION:  [facility]                         FACILITY:  [facility]  PHYSICIAN:  Vonna Kotyk. Camelia Stelzner, M.D.DATE OF BIRTH:  November 09, 1940  DATE OF PROCEDURE:  03/08/2012 DATE OF DISCHARGE:                              OPERATIVE REPORT   PREOPERATIVE DIAGNOSIS:  Probable infected right total knee replacement.  POSTOPERATIVE DIAGNOSIS:  Probable infected right total knee replacement.  PROCEDURE: 1. Exploration of right total knee replacement with synovectomy and     submission of pathology specimens. 2. Removal of total knee replacement components. 3. Insertion of antibiotic spacers.  SURGEON:  Vonna Kotyk. Durward Fortes, MD.  ASSISTANTAaron Edelman D. Petrarca, PA-C.  ANESTHESIA:  General with supplemental femoral nerve block.  COMPLICATIONS:  None.  PROCEDURE:  Mrs. Glackin was met in the Holding Area, identified the right knee as the appropriate operative site.  She did receive a preoperative femoral nerve block.  The patient was then transported to room #10 and placed under general anesthesia without difficulty.  The nursing staff inserted a Foley catheter.  Urine was clear.  Tourniquet was then applied to the right thigh.  The leg was prepped with chlorhexidine scrub and then DuraPrep from the tourniquet to the midfoot.  Sterile draping was performed.  With the extremity elevated, it was Esmarch exsanguinated with a proximal tourniquet at 350 mmHg.  The prior midline longitudinal incision was elliptically excised.  The superficial fascia and first layer of capsule was incised in the midline.  A medial parapatellar incision was made through the old incision and nonabsorbable stitches were removed.  Further thickened capsule was incised to enter the joint.  There was a cloudy fluid that was sent for culture and sensitivity.  I debrided quite a bit of soft tissue so that I could evert the patella 180  degrees and then flexed the knee about 90 degrees.  There was abundant synovitis.  I sent some deep specimens of synovium and what appears to be granulation tissue.  The pathologist noted there were multiple white cells per high-powered field.  We sent several specimens for culture.  After the synovectomy, the components were removed using the oscillating saw and the osteotomes. We had little of any bone loss in both the femur and the tibia.  I amputated the stem and the polyethylene component was removed in one piece.  At that point, we further debrided any abnormal tissue beneath the components and then copiously irrigated the joint with saline solution.  With evidence of deep infection, we elected to insert antibiotic spacers rather than revise the components.  We used polymethyl methacrylate with embedded vancomycin and used a standard Plus DePuy femoral mold and then applied to the femur aligned with the tibia.  We elected to use a 12.5-mm polyethylene bridging bearing and methacrylate on the proximal tibia so that we could have symmetrical varus valgus with the knee in full extension.  There was no varus or valgus.  There was very minimal instability and we allowed the methacrylate to mature.  I also used beads of calcium sulfate with embedded tobramycin and gentamicin.  At that point, we injected the  deep capsule with 0.25% Marcaine with epinephrine.  We were able to fully extend the knee and align the knee in probably 78 degrees of flexion. There was very minimal opening with varus and valgus stress.  At that point, the tourniquet was deflated about an 1 and 6 minutes.  We carefully bovied the soft tissue.  We used a Hemovac.  The deep capsule was closed with interrupted #1 PDS, superficial capsule with 2 PDS and #3 Monocryl, skin was closed with skin clips.  Sterile bulky dressing was applied followed by patient's support stocking.  The patient tolerated the procedure well,  without complications.     Vonna Kotyk. Durward Fortes, M.D.     PWW/MEDQ  D:  03/08/2012  T:  03/08/2012  Job:  [id number]

## 2012-03-08 NOTE — Op Note (Signed)
PATIENT ID:      NYLANI ROCHELL  MRN:     [id number] DOB/AGE:    11-05-1940 / 71 y.o.       OPERATIVE REPORT    DATE OF PROCEDURE:  03/08/2012       PREOPERATIVE DIAGNOSIS:   loosening vs possible infection right total knee                                                       There is no height or weight on file to calculate BMI.     POSTOPERATIVE DIAGNOSIS:   loosening vs possible infection right total knee                                                                     There is no height or weight on file to calculate BMI.     PROCEDURE:  Procedure(s)exploration right total knee replacement with debridement of synovitis, cultures, removal of prostheses and insertion of antibiotic spacers     SURGEON:  [name], MD    ASSISTANT:   Biagio Borg, PA-C   (Present and scrubbed throughout the case, critical for assistance with exposure, retraction, instrumentation, and closure.)          ANESTHESIA: Regional with general     DRAINS: hemovac right knee     TOURNIQUET TIME:  Total Tourniquet Time Documented: Thigh (Right) - [id number] minutes    COMPLICATIONS:  None   CONDITION:  stable  PROCEDURE IN DETAIL: dictation [id number]  [name] W 03/08/2012, 12:56 PM

## 2012-03-08 NOTE — Progress Notes (Signed)
ANTIBIOTIC CONSULT NOTE - INITIAL  Pharmacy Consult for Vancomycin Indication: ?R TKR infection  Allergies  Allergen Reactions  . Amoxicillin-Pot Clavulanate Other (See Comments)    Augmentin per patient, tongue swelling    Patient Measurements: Height: 5\' 6"  (167.6 cm) Weight: 177 lb 11.1 oz (80.6 kg) IBW/kg (Calculated) : 59.3   Vital Signs: Temp: 99.5 F (37.5 C) (06/11 1458) Temp src: Oral (06/11 0718) BP: 136/73 mmHg (06/11 1458) Pulse Rate: 66  (06/11 1458) Intake/Output from previous day:   Intake/Output from this shift: Total I/O In: 1700 [I.V.:1700] Out: 575 [Urine:375; Drains:100; Blood:100]  Labs: No results found for this basename: WBC:3,HGB:3,PLT:3,LABCREA:3,CREATININE:3 in the last 72 hours Estimated Creatinine Clearance: 58.8 ml/min (by C-G formula based on Cr of 0.94). No results found for this basename: VANCOTROUGH:2,VANCOPEAK:2,VANCORANDOM:2,GENTTROUGH:2,GENTPEAK:2,GENTRANDOM:2,TOBRATROUGH:2,TOBRAPEAK:2,TOBRARND:2,AMIKACINPEAK:2,AMIKACINTROU:2,AMIKACIN:2, in the last 72 hours   Microbiology: Recent Results (from the past 720 hour(s))  SURGICAL PCR SCREEN     Status: Normal   Collection Time   03/02/12 10:30 AM      Component Value Range Status Comment   MRSA, PCR NEGATIVE  NEGATIVE  Final    Staphylococcus aureus NEGATIVE  NEGATIVE  Final   URINE CULTURE     Status: Normal   Collection Time   03/02/12 10:31 AM      Component Value Range Status Comment   Specimen Description URINE, CLEAN CATCH   Final    Special Requests NONE   Final    Culture  Setup Time [id number]   Final    Colony Count NO GROWTH   Final    Culture NO GROWTH   Final    Report Status 03/03/2012 FINAL   Final   GRAM STAIN     Status: Normal   Collection Time   03/08/12 10:55 AM      Component Value Range Status Comment   Specimen Description FLUID SYNOVIAL RIGHT KNEE   Final    Special Requests NONE   Final    Gram Stain     Final    Value: FEW WBC PRESENT,BOTH PMN AND  MONONUCLEAR     NO ORGANISMS SEEN     Gram Stain Report Called to,Read Back By and Verified With: Verner Chol AT 1217 03/08/12 BY K BARR   Report Status 03/08/2012 FINAL   Final   GRAM STAIN     Status: Normal   Collection Time   03/08/12 10:55 AM      Component Value Range Status Comment   Specimen Description TISSUE RIGHT KNEE   Final    Special Requests TIBIAL PLATEAU   Final    Gram Stain     Final    Value: ABUNDANT WBC PRESENT, PREDOMINANTLY PMN     NO ORGANISMS SEEN     Gram Stain Report Called to,Read Back By and Verified With: Verner Chol AT 1217 03/08/12 BY K BARR   Report Status 03/08/2012 FINAL   Final   GRAM STAIN     Status: Normal   Collection Time   03/08/12 11:01 AM      Component Value Range Status Comment   Specimen Description TISSUE RIGHT KNEE   Final    Special Requests RIGHT KNEE MEDIAL FEMUR TISSUE FOR CULTURE   Final    Gram Stain     Final    Value: FEW WBC PRESENT,BOTH PMN AND MONONUCLEAR     NO ORGANISMS SEEN     Gram Stain Report Called to,Read Back By and Verified With: Verner Chol AT 1217  03/08/12 BY K BARR   Report Status 03/08/2012 FINAL   Final   GRAM STAIN     Status: Normal   Collection Time   03/08/12 11:03 AM      Component Value Range Status Comment   Specimen Description TISSUE RIGHT KNEE   Final    Special Requests     Final    Value: RIGHT KNEE POCKET PROXIMAL MEDIAL TIBIA FOR CULTURE   Gram Stain     Final    Value: ABUNDANT WBC PRESENT,BOTH PMN AND MONONUCLEAR     NO ORGANISMS SEEN     Gram Stain Report Called to,Read Back By and Verified With: Verner Chol AT [id number] 03/08/12 BY K BARR   Report Status 03/08/2012 FINAL   Final   GRAM STAIN     Status: Normal   Collection Time   03/08/12 11:19 AM      Component Value Range Status Comment   Specimen Description TISSUE RIGHT KNEE   Final    Special Requests RIGHT KNEE LATERAL TIBIAL PLATIAR TISSUE FOR CX'S   Final    Gram Stain     Final    Value: ABUNDANT WBC PRESENT,BOTH PMN  AND MONONUCLEAR     NO ORGANISMS SEEN     Gram Stain Report Called to,Read Back By and Verified With: Verner Chol AT 1235 03/08/12 BY K BARR   Report Status 03/08/2012 FINAL   Final     Medical History: Past Medical History  Diagnosis Date  . Osteoarthritis   . Edema, peripheral   . CAD (coronary artery disease)     s/p BMS to LAD  . Breast pain     left  . Hyperlipidemia   . Hypertension   . Sinusitis     acute- NOS  . Allergic rhinitis   . Blood in stool   . Adenocarcinoma, colon   . Anemia   . Sciatica   . Cystitis, acute   . Chronic diarrhea   . Nausea   . GERD (gastroesophageal reflux disease)   . Depression   . Anxiety   . Osteoarthritis   . IBS (irritable bowel syndrome)   . Degenerative disk disease   . Carpal tunnel syndrome   . Diabetes mellitus, type 2   . Family history of diabetes mellitus     Assessment: 71 y.o. F to start Vancomycin for possible infected R TKR, now s/p debridement, removal of prostheses, and insertion of antibiotic spacers. Vancomycin 1g powder was used intra-operatively during the procedure today -- however will account for minimal absorption with this. Wt: 80.6 kg, SCr 0.94, CrCl~55-60 ml/min.  Goal of Therapy:  Vancomycin trough level 15-20 mcg/ml  Plan:  1. Vancomycin 750 mg IV every 12 hours (starting this evening at 1800) 2. Will continue to follow renal function, culture results, LOT, and antibiotic de-escalation plans   Alycia Rossetti, PharmD, BCPS Clinical Pharmacist Pager: [phone] 03/08/2012 3:31 PM

## 2012-03-08 NOTE — Transfer of Care (Signed)
Immediate Anesthesia Transfer of Care Note  Patient: Tonya [name]  Procedure(s) Performed: Procedure(s) (LRB): TOTAL KNEE REVISION (Right)  Patient Location: PACU  Anesthesia Type: General  Level of Consciousness: awake  Airway & Oxygen Therapy: Patient Spontanous Breathing and Patient connected to face mask oxygen  Post-op Assessment: Report given to PACU RN and Post -op Vital signs reviewed and stable  Post vital signs: Reviewed and stable  Complications: No apparent anesthesia complications

## 2012-03-08 NOTE — Anesthesia Procedure Notes (Signed)
Anesthesia Regional Block:  Femoral nerve block  Pre-Anesthetic Checklist: ,, timeout performed, Correct Patient, Correct Site, Correct Laterality, Correct Procedure, Correct Position, site marked, Risks and benefits discussed,  Surgical consent,  Pre-op evaluation,  At surgeon's request and post-op pain management  Laterality: Right     Needles:  Injection technique: Single-shot  Needle Type: Stimulator Needle - 80        Needle insertion depth: 6 cm   Additional Needles:  Procedures: nerve stimulator Femoral nerve block  Nerve Stimulator or Paresthesia:  Response: 0.5 mA, 0.1 ms, 6 cm  Additional Responses:   Narrative:  Start time: 03/08/2012 9:30 AM End time: 03/08/2012 9:38 AM Injection made incrementally with aspirations every 5 mL.  Performed by: Personally  Anesthesiologist: Rande Lawman MD  Additional Notes: 22cc 0.5% Marcaine w/epi w/ mild discomfortand difficulty. GES

## 2012-03-08 NOTE — Anesthesia Postprocedure Evaluation (Signed)
  Anesthesia Post-op Note  Patient: Tonya [name]  Procedure(s) Performed: Procedure(s) (LRB): TOTAL KNEE REVISION (Right)  Patient Location: PACU  Anesthesia Type: GA combined with regional for post-op pain  Level of Consciousness: awake, alert , oriented and patient cooperative  Airway and Oxygen Therapy: Patient Spontanous Breathing and Patient connected to nasal cannula oxygen  Post-op Pain: mild  Post-op Assessment: Post-op Vital signs reviewed, Patient's Cardiovascular Status Stable, Respiratory Function Stable, Patent Airway, No signs of Nausea or vomiting and Pain level controlled  Post-op Vital Signs: stable  Complications: No apparent anesthesia complications

## 2012-03-08 NOTE — Anesthesia Preprocedure Evaluation (Signed)
Anesthesia Evaluation  Patient identified by MRN, date of birth, ID band Patient awake    Reviewed: Allergy & Precautions, H&P , NPO status , Patient's Chart, lab work & pertinent test results  Airway Mallampati: I TM Distance: >3 FB Neck ROM: full    Dental   Pulmonary          Cardiovascular hypertension, + CAD + dysrhythmias Atrial Fibrillation Rhythm:irregular Rate:Normal     Neuro/Psych  Neuromuscular disease    GI/Hepatic GERD-  ,  Endo/Other  Diabetes mellitus-, Type 2  Renal/GU      Musculoskeletal   Abdominal   Peds  Hematology   Anesthesia Other Findings   Reproductive/Obstetrics                           Anesthesia Physical Anesthesia Plan  ASA: III  Anesthesia Plan: General   Post-op Pain Management:    Induction: Intravenous  Airway Management Planned: Oral ETT  Additional Equipment:   Intra-op Plan:   Post-operative Plan: Extubation in OR  Informed Consent: I have reviewed the patients History and Physical, chart, labs and discussed the procedure including the risks, benefits and alternatives for the proposed anesthesia with the patient or authorized representative who has indicated his/her understanding and acceptance.     Plan Discussed with: Anesthesiologist, Surgeon and CRNA  Anesthesia Plan Comments:         Anesthesia Quick Evaluation

## 2012-03-08 NOTE — Progress Notes (Signed)
Patient ID: Tonya [name], female   DOB: 09/18/1941, 71 y.o.   MRN: [id number] There has been no change in health status since  the current H&P.I have examined the patient and discussed the surgery. No contraindications to the planned procedure exist.

## 2012-03-08 NOTE — Consult Note (Addendum)
Infectious Diseases Initial Consultation  Reason for Consultation:  Septic prosthetic joint infection   HPI: Tonya [name] is a 71 y.o. 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 She had been doing well until March 2013 she started having pain and some warmth, tenderness to medial inferior aspect of her knee, no trauma preceding her symptoms. No fever, chills or nightsweat. The knee was aspirated at that time of some cloudy orange brown fluid and this was sent for evaluation. Her studies came back with no organisms on the Gram stain and no growth for 3 days. At that time she had an 11,400 white count with sedimentation rate of 55 and C-reactive protein 1.54. Cell count was 17,065 white cells, 90% neutrophils and 10% monocytes or macrophages. No crystals were noted. At that time was not placed on any antibiotics since not meeting criteria for infection.she initially felt better after the aspiration.however over time, she had recurrent erythema to medial inferior aspect of knee, a "bump", pain with ambulation, alittle tender and warm to palpation. Since her symptoms persisted,.a bone scan was obtained which revealed abnormal uptake on all 3 phases  within the right knee involving the femoral condyles and tibial plateau consistent with joint infection or possible prosthetic loosening. She was admitted for planned revision vs. Hardware removal if signs of infection. She is pod #0 for hardware removal, antibiotic spacer (of vanco,gent,tobra) placement. Doing fine post-operatively, has good pain management.   Past Medical History  Diagnosis Date  . Osteoarthritis   . Edema, peripheral   . CAD (coronary artery disease)     s/p BMS to LAD  . Breast pain     left  . Hyperlipidemia   . Hypertension   . Sinusitis     acute- NOS  . Allergic rhinitis   . Blood in  stool   . Adenocarcinoma, colon   . Anemia   . Sciatica   . Cystitis, acute   . Chronic diarrhea   . Nausea   . GERD (gastroesophageal reflux disease)   . Depression   . Anxiety   . Osteoarthritis   . IBS (irritable bowel syndrome)   . Degenerative disk disease   . Carpal tunnel syndrome   . Diabetes mellitus, type 2   . Family history of diabetes mellitus     Allergies:  Allergies  Allergen Reactions  . Amoxicillin-Pot Clavulanate Other (See Comments)    Augmentin per patient, tongue swelling   - interestingly she can tolerate taking amoxicillin but has definite tongue swelling with amox/clav  Current antibiotics: vanco  MEDICATIONS:    . acetaminophen      . acetaminophen  1,000 mg Intravenous Q6H  . atorvastatin  20 mg Oral q1800  . carvedilol  12.5 mg Oral BID WC  . cholestyramine light  4 g Oral Q12H  . citalopram  40 mg Oral Daily  . diltiazem  240 mg Oral Daily  . ferrous sulfate  325 mg Oral BID WC  . glyBURIDE  1.25 mg Oral BID WC  . hydrochlorothiazide  25 mg Oral Daily  . HYDROmorphone      . insulin aspart  0-15 Units Subcutaneous TID WC  . insulin aspart  0-5 Units Subcutaneous QHS  . ketorolac      . pantoprazole  40 mg Oral Q1200  . potassium chloride SA  20 mEq Oral BID  . rivaroxaban  10 mg Oral Daily  . senna  1 tablet Oral BID  . vancomycin  750 mg Intravenous Q12H  . DISCONTD: acetaminophen  1,000 mg Intravenous Once  . DISCONTD: chlorhexidine  60 mL Topical Q2000  . DISCONTD: chlorhexidine  60 mL Topical Once  . DISCONTD: ferrous sulfate  325 mg Oral BID  . DISCONTD: ketorolac  7.5 mg Intravenous Q6H    History  Substance Use Topics  . Smoking status: Never Smoker   . Smokeless tobacco: Never Used  . 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  Family History  Problem Relation Age of Onset  . Heart disease Mother     massive MI age 42  . Heart disease Father     Massive MI age 71    Review of Systems    Constitutional: Negative for fever, chills, diaphoresis, activity change, appetite change, fatigue and unexpected weight change.  HENT: Negative for congestion, sore throat, rhinorrhea, sneezing, trouble swallowing and sinus pressure.  Eyes: Negative for photophobia and visual disturbance.  Respiratory: Negative for cough, chest tightness, shortness of breath, wheezing and stridor.  Cardiovascular: Negative for chest pain, palpitations and leg swelling.  Gastrointestinal: Negative for nausea, vomiting, abdominal pain, diarrhea, constipation, blood in stool, abdominal distention and anal bleeding.  Genitourinary: Negative for dysuria, hematuria, flank pain and difficulty urinating.  Musculoskeletal: Negative for myalgias, back pain, joint swelling, arthralgias and gait problem.  Skin: Negative for color change, pallor, rash and wound.  Neurological: Negative for dizziness, tremors, weakness and light-headedness.  Hematological: Negative for adenopathy. Does not bruise/bleed easily.  Psychiatric/Behavioral: Negative for behavioral problems, confusion, sleep disturbance, dysphoric mood, decreased concentration and agitation.    OBJECTIVE: Temp:  [97.1 F (36.2 C)-99.5 F (37.5 C)] 99.5 F (37.5 C) (06/11 1458) Pulse Rate:  [65-76] 66  (06/11 1458) Resp:  [14-20] 16  (06/11 1458) BP: (136-150)/(46-73) 136/73 mmHg (06/11 1458) SpO2:  [96 %-100 %] 96 % (06/11 1458) Weight:  [177 lb 11.1 oz (80.6 kg)] 177 lb 11.1 oz (80.6 kg) (06/11 1500) BP 136/73  Pulse 66  Temp(Src) 99.5 F (37.5 C) (Oral)  Resp 16  Ht 5\' 6"  (1.676 m)  Wt 177 lb 11.1 oz (80.6 kg)  BMI 28.68 kg/m2  SpO2 96%  General Appearance:    Alert, cooperative, no distress, appears stated age  Head:    Normocephalic, without obvious abnormality, atraumatic  Eyes:    PERRL, conjunctiva/corneas clear, EOM's intact,      Nose:   Nares normal, septum midline, mucosa normal, no drainage    or sinus tenderness  Throat:   Lips,  mucosa, and tongue normal; top dentures in place  Neck:   Supple, symmetrical, trachea midline, no adenopathy;    thyroid:  no enlargement/tenderness/nodules; no carotid   bruit or JVD     Lungs:     Clear to auscultation bilaterally, respirations unlabored      Heart:    Regular rate and rhythm, S1 and S2 normal, no murmur, rub   or gallop     Abdomen:     Soft, non-tender, bowel sounds active all four quadrants,    no masses, no organomegaly. Surgical scar noted        Extremities:   Right leg wrapped nearly completely from surgery, accordian drain in place  Pulses:   2+ and symmetric all extremities  Skin:   Skin color, texture, turgor normal, no rashes or  lesions  Lymph nodes:   Cervical, supraclavicular, and axillary nodes normal  Neurologic:   CNII-XII intact, normal strength, sensation and reflexes    Throughout, deferred lower extremity motor exam. Upper extremities 5/5 bilaterally   LABS: CBC    Component Value Date/Time   WBC 13.0* 03/02/2012 1032   RBC 4.13 03/02/2012 1032   HGB 11.2* 03/02/2012 1032   HCT 34.0* 03/02/2012 1032   PLT 283 03/02/2012 1032   MCV 82.3 03/02/2012 1032   MCH 27.1 03/02/2012 1032   MCHC 32.9 03/02/2012 1032   RDW 13.9 03/02/2012 1032   LYMPHSABS 3.0 03/02/2012 1032   MONOABS 1.0 03/02/2012 1032   EOSABS 0.2 03/02/2012 1032   BASOSABS 0.1 03/02/2012 1032    BMET    Component Value Date/Time   NA 137 03/02/2012 1032   K 4.4 03/02/2012 1032   CL 103 03/02/2012 1032   CO2 25 03/02/2012 1032   GLUCOSE 103* 03/02/2012 1032   GLUCOSE 100* 09/07/2006 0849   BUN 15 03/02/2012 1032   CREATININE 0.94 03/02/2012 1032   CALCIUM 9.2 03/02/2012 1032   GFRNONAA 60* 03/02/2012 1032   GFRAA 69* 03/02/2012 1032     Results for orders placed during the hospital encounter of 03/08/12 (from the past 48 hour(s))  GRAM STAIN     Status: Normal   Collection Time   03/08/12 10:55 AM      Component Value Range Comment   Specimen Description FLUID SYNOVIAL RIGHT KNEE      Special Requests  NONE      Gram Stain        Value: FEW WBC PRESENT,BOTH PMN AND MONONUCLEAR     NO ORGANISMS SEEN     Gram Stain Report Called to,Read Back By and Verified With: Verner Chol AT 1217 03/08/12 BY K BARR   Report Status 03/08/2012 FINAL     GRAM STAIN     Status: Normal   Collection Time   03/08/12 10:55 AM      Component Value Range Comment   Specimen Description TISSUE RIGHT KNEE      Special Requests TIBIAL PLATEAU      Gram Stain        Value: ABUNDANT WBC PRESENT, PREDOMINANTLY PMN     NO ORGANISMS SEEN     Gram Stain Report Called to,Read Back By and Verified With: Verner Chol AT 1217 03/08/12 BY K BARR   Report Status 03/08/2012 FINAL     GRAM STAIN     Status: Normal   Collection Time   03/08/12 11:01 AM      Component Value Range Comment   Specimen Description TISSUE RIGHT KNEE      Special Requests RIGHT KNEE MEDIAL FEMUR TISSUE FOR CULTURE      Gram Stain        Value: FEW WBC PRESENT,BOTH PMN AND MONONUCLEAR     NO ORGANISMS SEEN     Gram Stain Report Called to,Read Back By and Verified With: Verner Chol AT 1217 03/08/12 BY K BARR   Report Status 03/08/2012 FINAL     GRAM STAIN     Status: Normal   Collection Time   03/08/12 11:03 AM      Component Value Range Comment   Specimen Description TISSUE RIGHT KNEE      Special Requests        Value: RIGHT KNEE POCKET PROXIMAL MEDIAL TIBIA FOR CULTURE   Gram Stain        Value: ABUNDANT  WBC PRESENT,BOTH PMN AND MONONUCLEAR     NO ORGANISMS SEEN     Gram Stain Report Called to,Read Back By and Verified With: Verner Chol AT [id number] 03/08/12 BY K BARR   Report Status 03/08/2012 FINAL     GRAM STAIN     Status: Normal   Collection Time   03/08/12 11:19 AM      Component Value Range Comment   Specimen Description TISSUE RIGHT KNEE      Special Requests RIGHT KNEE LATERAL TIBIAL PLATIAR TISSUE FOR CX'S      Gram Stain        Value: ABUNDANT WBC PRESENT,BOTH PMN AND MONONUCLEAR     NO ORGANISMS SEEN     Gram Stain  Report Called to,Read Back By and Verified With: Verner Chol AT 1235 03/08/12 BY K BARR   Report Status 03/08/2012 FINAL     GLUCOSE, CAPILLARY     Status: Abnormal   Collection Time   03/08/12  1:21 PM      Component Value Range Comment   Glucose-Capillary 150 (*) 70 - 99 (mg/dL)    Comment 1 Notify RN      Comment 2 Documented in Chart     GLUCOSE, CAPILLARY     Status: Abnormal   Collection Time   03/08/12  4:23 PM      Component Value Range Comment   Glucose-Capillary 111 (*) 70 - 99 (mg/dL)    Comment 1 Notify RN        MICRO: 6/11 tissue cx pending   Assessment/Plan:  71yo F with TKA in Aug 2011 presents with delayed prosthetic joint infection s/p hardware removal, and antibiotic spacer/temp prosthesis, undergoing a 2 stage exchange. Cultures pending. Delayed PJI are usually caused by CoNS or S.aureus and roughly 10-15% gram negative organisms.  - continue with vancomycin for now - would also start aztreonam for gram negative coverage empirically. Ideally would do ceftriaxone but will need to review her antibiotic allergy if she has had exposures to other beta-lactams. - will narrow spectrum once micro results become available - will need to treat for 6 wks and re-sample synovial fluid off of antibiotics to assess if ok to move onto new implant.

## 2012-03-08 NOTE — Preoperative (Signed)
Beta Blockers   Reason not to administer Beta Blockers:Not Applicable 

## 2012-03-09 DIAGNOSIS — Y831 Surgical operation with implant of artificial internal device as the cause of abnormal reaction of the patient, or of later complication, without mention of misadventure at the time of the procedure: Secondary | ICD-10-CM

## 2012-03-09 DIAGNOSIS — T8450XA Infection and inflammatory reaction due to unspecified internal joint prosthesis, initial encounter: Secondary | ICD-10-CM

## 2012-03-09 LAB — GLUCOSE, CAPILLARY
Glucose-Capillary: 115 mg/dL — ABNORMAL HIGH (ref 70–99)
Glucose-Capillary: 149 mg/dL — ABNORMAL HIGH (ref 70–99)
Glucose-Capillary: 152 mg/dL — ABNORMAL HIGH (ref 70–99)
Glucose-Capillary: 146 mg/dL — ABNORMAL HIGH (ref 70–99)
Glucose-Capillary: 114 mg/dL — ABNORMAL HIGH (ref 70–99)

## 2012-03-09 LAB — CBC
Hemoglobin: 8.8 g/dL — ABNORMAL LOW (ref 12.0–15.0)
MCH: 26.3 pg (ref 26.0–34.0)
MCV: 82 fL (ref 78.0–100.0)
RBC: 3.34 MIL/uL — ABNORMAL LOW (ref 3.87–5.11)
WBC: 10.4 10*3/uL (ref 4.0–10.5)

## 2012-03-09 LAB — BASIC METABOLIC PANEL
CO2: 24 mEq/L (ref 19–32)
Calcium: 8.2 mg/dL — ABNORMAL LOW (ref 8.4–10.5)
Chloride: 101 mEq/L (ref 96–112)
Glucose, Bld: 150 mg/dL — ABNORMAL HIGH (ref 70–99)
Sodium: 136 mEq/L (ref 135–145)

## 2012-03-09 MED ORDER — ACETAMINOPHEN 10 MG/ML IV SOLN
1000.0000 mg | Freq: Four times a day (QID) | INTRAVENOUS | Status: AC
  Administered 2012-03-09 – 2012-03-10 (×3): 1000 mg via INTRAVENOUS
  Filled 2012-03-09 (×4): qty 100

## 2012-03-09 NOTE — Evaluation (Signed)
Occupational Therapy Evaluation Patient Details Name: Tonya [name] MRN: [id number] DOB: 04/22/1941 Today's Date: 03/09/2012 Time:  -     OT Assessment / Plan / Recommendation Clinical Impression  Pt presents with with delayed prosthetic joint infection s/p hardware removal, and antibiotic spacer/temp prosthesis, undergoing a 2 stage exchange. Plan is for pt to d/c to [facility] living alone. Skilled OT recommended to maximize I w/BADLs to supervision level in prep for d/c to next venue of care.l    OT Assessment  Patient needs continued OT Services    Follow Up Recommendations  Skilled nursing facility    Barriers to Discharge Decreased caregiver support    Equipment Recommendations  Defer to next venue    Recommendations for Other Services    Frequency  Min 1X/week    Precautions / Restrictions Precautions Precautions: Knee Required Braces or Orthoses: Knee Immobilizer - Right Knee Immobilizer - Right: On when out of bed or walking   Pertinent Vitals/Pain Reports 3/10 pain    ADL  Grooming: Simulated;Set up Where Assessed - Grooming: Unsupported sitting Upper Body Bathing: Simulated;Set up Where Assessed - Upper Body Bathing: Unsupported sitting Lower Body Bathing: Simulated;Minimal assistance Where Assessed - Lower Body Bathing: Supported sit to stand Upper Body Dressing: Simulated;Set up Where Assessed - Upper Body Dressing: Unsupported sitting Lower Body Dressing: Simulated;Moderate assistance Where Assessed - Lower Body Dressing: Sopported sit to stand Toilet Transfer: Simulated;Minimal assistance Toilet Transfer Method: Stand pivot Toileting - Clothing Manipulation and Hygiene: Simulated;Minimal assistance Where Assessed - Camera operator Manipulation and Hygiene: Standing Equipment Used: Rolling walker Transfers/Ambulation Related to ADLs: Pt only agreeable to bed<>chair transfer due to fatigue and just returning to bed.    OT Diagnosis: Generalized  weakness  OT Problem List: Decreased activity tolerance;Decreased knowledge of use of DME or AE;Pain OT Treatment Interventions: Self-care/ADL training;Therapeutic activities;DME and/or AE instruction;Patient/family education   OT Goals Acute Rehab OT Goals OT Goal Formulation: With patient Time For Goal Achievement: 03/16/12 Potential to Achieve Goals: Good ADL Goals Pt Will Perform Grooming: with supervision;Standing at sink ADL Goal: Grooming - Progress: Goal set today Pt Will Perform Lower Body Bathing: with supervision;Sit to stand from chair;Sit to stand from bed ADL Goal: Lower Body Bathing - Progress: Goal set today Pt Will Perform Lower Body Dressing: with supervision;Sit to stand from chair;Sit to stand from bed ADL Goal: Lower Body Dressing - Progress: Goal set today Pt Will Transfer to Toilet: with supervision;Maintaining weight bearing status;3-in-1;Ambulation ADL Goal: Toilet Transfer - Progress: Goal set today Pt Will Perform Toileting - Clothing Manipulation: with supervision;Standing;Sitting on 3-in-1 or toilet ADL Goal: Toileting - Clothing Manipulation - Progress: Goal set today Pt Will Perform Toileting - Hygiene: with supervision;Sit to stand from 3-in-1/toilet ADL Goal: Toileting - Hygiene - Progress: Goal set today  Visit Information  Last OT Received On: 03/09/12    Subjective Data  Subjective: I have to have sx again in 6-8 weeks. Patient Stated Goal: Eventually return home alone.   Prior Functioning  Home Living Lives With: Alone Available Help at Discharge: [place] (planning [facility] at d/c) Type of Home: House Home Access: Level entry Home Layout: One level Bathroom Shower/Tub: Multimedia programmer: Standard Home Adaptive Equipment: Walker - rolling;Raised toilet seat with rails;Shower chair without back Prior Function Level of Independence: Independent Able to Take Stairs?: Yes Driving: Yes    Cognition  Overall  Cognitive Status: Appears within functional limits for tasks assessed/performed Arousal/Alertness: Awake/alert Orientation Level: Appears intact  for tasks assessed Behavior During Session: [facility] for tasks performed    Extremity/Trunk Assessment Right Upper Extremity Assessment RUE ROM/Strength/Tone: Within functional levels Left Upper Extremity Assessment LUE ROM/Strength/Tone: Within functional levels   Mobility Bed Mobility Bed Mobility: Supine to Sit Supine to Sit: 4: Min assist;With rails;HOB elevated Details for Bed Mobility Assistance: min VCs for hand placement, sequencing and technique. Physical A needed for RLE. Transfers Transfers: Sit to Stand;Stand to Sit Sit to Stand: 4: Min assist;With upper extremity assist;From bed;From chair/3-in-1 Stand to Sit: 4: Min assist;With upper extremity assist;With armrests;To bed;To chair/3-in-1 Details for Transfer Assistance: Min VCs for hand placement and RLE position.   Exercise    Balance    End of Session OT - End of Session Activity Tolerance: Patient limited by fatigue Patient left: in bed;with call bell/phone within reach   [place] A OTR/L [phone] 03/09/2012, 2:42 PM

## 2012-03-09 NOTE — Progress Notes (Signed)
Orthopedic Tech Progress Note Patient Details:  Tonya [name] 01-22-1941 [id number]  Ortho Devices Type of Ortho Device: Knee Immobilizer   Cammer, Theodoro Parma 03/09/2012, 8:40 AM

## 2012-03-09 NOTE — Progress Notes (Signed)
Patient ID: Tonya [name], female   DOB: [birth year], 71 y.o.   MRN: [id number] PATIENT ID: Tonya [name]        MRN:  [id number]          DOB/AGE: [birth year] / 71 y.o.  Tonya Fears, MD   Biagio Borg, PA-C [address], [place], [place]  [id number]                             [phone]   PROGRESS NOTE  Subjective:  negative for Chest Pain  negative for Shortness of Breath  negative for Nausea/Vomiting   negative for Calf Pain but has pain on lateral aspect of calf  negative for Bowel Movement   Tolerating Diet: yes         Patient reports pain as mild.     Pain improving, denies n/v sx  Objective: Vital signs in last 24 hours:   Patient Vitals for the past 24 hrs:  BP Temp Pulse Resp SpO2 Height Weight  03/09/12 0609 137/68 mmHg 99.2 F (37.3 C) 74  18  96 % - -  03/09/12 0000 - - - 18  96 % - -  03/08/12 2214 138/67 mmHg 99.4 F (37.4 C) 81  18  96 % - -  03/08/12 2000 - - - 18  98 % - -  03/08/12 1802 136/73 mmHg - - - - - -  03/08/12 1500 - - - - - 5\' 6"  (1.676 m) 80.6 kg (177 lb 11.1 oz)  03/08/12 1458 136/73 mmHg 99.5 F (37.5 C) 66  16  96 % - -  03/08/12 1415 141/46 mmHg 98 F (36.7 C) 71  14  98 % - -  03/08/12 1400 141/52 mmHg - 74  16  98 % - -  03/08/12 1345 144/51 mmHg - 76  16  97 % - -  03/08/12 1330 148/62 mmHg - 74  20  97 % - -  03/08/12 1315 147/67 mmHg 97.1 F (36.2 C) 75  18  98 % - -  03/08/12 0937 - - 72  16  100 % - -  03/08/12 0931 - - 72  18  100 % - -  03/08/12 0930 - - 71  18  100 % - -      Intake/Output from previous day:   06/11 0701 - 06/12 0700 In: 3080 [P.O.:480; I.V.:2600] Out: 1986 [Urine:1525; Drains:361]   Intake/Output this shift:       Intake/Output      06/11 0701 - 06/12 0700 06/12 0701 - 06/13 0700   P.O. 480    I.V. (mL/kg) 2600 (32.3)    Total Intake(mL/kg) 3080 (38.2)    Urine (mL/kg/hr) 1525 (0.8)    Drains 361    Blood 100    Total Output 1986    Net +1094            LABORATORY  DATA:  Basename 03/09/12 0500 03/02/12 1032  WBC 10.4 13.0*  HGB 8.8* 11.2*  HCT 27.4* 34.0*  PLT 210 283    Basename 03/09/12 0500 03/02/12 1032  NA 136 137  K 3.5 4.4  CL 101 103  CO2 24 25  BUN 14 15  CREATININE 1.09 0.94  GLUCOSE 150* 103*  CALCIUM 8.2* 9.2   Lab Results  Component Value Date   INR 1.04 03/02/2012   INR 1.78* 05/31/2010   INR 2.21* 05/30/2010  Examination:  General appearance: alert, cooperative, mild distress and mildly obese Resp: clear to auscultation bilaterally Cardio: regular rate and rhythm GI: normal findings: bowel sounds normal  Wound Exam: clean, dry, intact dressing  Drainage:  None: wound tissue dry  Motor Exam: EHL, FHL, Anterior Tibial and Posterior Tibial Intact  Sensory Exam: Superficial Peroneal, Deep Peroneal and Tibial normal  Vascular Exam: Right dorsalis pedis artery has 1+ (weak) pulse  Assessment:    1 Day Post-Op  Procedure(s) (LRB): TOTAL KNEE REVISION (Right)  ADDITIONAL DIAGNOSIS:  Principal Problem:  *Infected prosthetic knee joint  Acute Blood Loss Anemia and Diabetes   Plan: Physical Therapy as ordered Partial Weight Bearing @ 50% (PWB)  DVT Prophylaxis:  Xarelto, Foot Pumps and TED hose  DISCHARGE PLAN: Skilled Nursing Facility/Rehab  DISCHARGE NEEDS: Walker and IV Antibiotics  Will pull drain this evening or in AM         [facility] 03/09/2012, 8:00 AM

## 2012-03-09 NOTE — Progress Notes (Signed)
INFECTIOUS DISEASE PROGRESS NOTE  ID: Tonya [name] is a 71 y.o. 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 Cultures pending.on vanco and aztreo   Subjective: afebrile  Abtx:  aztreo #2 vanco #2  Medications:     . acetaminophen      . acetaminophen  1,000 mg Intravenous Q6H  . acetaminophen  1,000 mg Intravenous Q6H  . atorvastatin  20 mg Oral q1800  . aztreonam  1 g Intravenous Q8H  . carvedilol  12.5 mg Oral BID WC  . cholestyramine light  4 g Oral Q12H  . citalopram  40 mg Oral Daily  . diltiazem  240 mg Oral Daily  . ferrous sulfate  325 mg Oral BID WC  . glyBURIDE  1.25 mg Oral BID WC  . hydrochlorothiazide  25 mg Oral Daily  . HYDROmorphone      . insulin aspart  0-15 Units Subcutaneous TID WC  . insulin aspart  0-5 Units Subcutaneous QHS  . ketorolac      . pantoprazole  40 mg Oral Q1200  . potassium chloride SA      . potassium chloride SA  20 mEq Oral BID  . rivaroxaban  10 mg Oral Daily  . senna  1 tablet Oral BID  . vancomycin  750 mg Intravenous Q12H  . DISCONTD: aztreonam  2 g Intravenous Q6H  . DISCONTD: chlorhexidine  60 mL Topical Q2000  . DISCONTD: chlorhexidine  60 mL Topical Once  . DISCONTD: ferrous sulfate  325 mg Oral BID  . DISCONTD: ketorolac  7.5 mg Intravenous Q6H    Objective: Vital signs in last 24 hours: Temp:  [98 F (36.7 C)-99.5 F (37.5 C)] 99.2 F (37.3 C) (06/12 0609) Pulse Rate:  [66-81] 74  (06/12 0609) Resp:  [14-18] 18  (06/12 0609) BP: (136-141)/(46-73) 137/68 mmHg (06/12 0609) SpO2:  [96 %-98 %] 96 % (06/12 0609) Weight:  [177 lb 11.1 oz (80.6 kg)] 177 lb 11.1 oz (80.6 kg) (06/11 1500)  gen = a x o by 3 in NAD HEENT= MMM, PERRLA, EOMI Pulm= CTAB, no w/c/r Cors= nl s1,s2 Abd= NTND, Bs+ Ext= right leg wrapped, accordian drain in place  Lab Results  Basename 03/09/12 0500  WBC 10.4    HGB 8.8*  HCT 27.4*  NA 136  K 3.5  CL 101  CO2 24  BUN 14  CREATININE 1.09  GLU --   Liver Panel No results found for this basename: PROT:2,ALBUMIN:2,AST:2,ALT:2,ALKPHOS:2,BILITOT:2,BILIDIR:2,IBILI:2 in the last 72 hours Sedimentation Rate  Basename 03/08/12 1800  ESRSEDRATE 50*   C-Reactive Protein  Basename 03/08/12 1800  CRP 1.29*    Microbiology: Recent Results (from the past 240 hour(s))  SURGICAL PCR SCREEN     Status: Normal   Collection Time   03/02/12 10:30 AM      Component Value Range Status Comment   MRSA, PCR NEGATIVE  NEGATIVE Final    Staphylococcus aureus NEGATIVE  NEGATIVE Final   URINE CULTURE     Status: Normal   Collection Time   03/02/12 10:31 AM      Component Value Range Status Comment   Specimen Description URINE, CLEAN CATCH   Final    Special Requests NONE   Final    Culture  Setup Time [id number]   Final    Colony Count NO GROWTH   Final    Culture NO GROWTH  Final    Report Status 03/03/2012 FINAL   Final   ANAEROBIC CULTURE     Status: Normal (Preliminary result)   Collection Time   03/08/12 10:52 AM      Component Value Range Status Comment   Specimen Description FLUID SYNOVIAL RIGHT KNEE   Final    Special Requests ON SWAB   Final    Gram Stain PENDING   Incomplete    Culture     Final    Value: NO ANAEROBES ISOLATED; CULTURE IN PROGRESS FOR 5 DAYS   Report Status PENDING   Incomplete   BODY FLUID CULTURE     Status: Normal (Preliminary result)   Collection Time   03/08/12 10:55 AM      Component Value Range Status Comment   Specimen Description FLUID SYNOVIAL RIGHT KNEE   Final    Special Requests ON SWAB   Final    Gram Stain     Final    Value: FEW WBC PRESENT,BOTH PMN AND MONONUCLEAR     NO ORGANISMS SEEN     Gram Stain Report Called to,Read Back By and Verified With: Gram Stain Report Called to,Read Back By and Verified With: Cline Crock RN @ [phone] ON 03/08/12 BY Raliegh Ip BARR Performed at [facility]   Culture NO GROWTH  1 DAY   Final    Report Status PENDING   Incomplete   GRAM STAIN     Status: Normal   Collection Time   03/08/12 10:55 AM      Component Value Range Status Comment   Specimen Description FLUID SYNOVIAL RIGHT KNEE   Final    Special Requests NONE   Final    Gram Stain     Final    Value: FEW WBC PRESENT,BOTH PMN AND MONONUCLEAR     NO ORGANISMS SEEN     Gram Stain Report Called to,Read Back By and Verified With: Verner Chol AT 1217 03/08/12 BY K BARR   Report Status 03/08/2012 FINAL   Final   TISSUE CULTURE     Status: Normal (Preliminary result)   Collection Time   03/08/12 10:55 AM      Component Value Range Status Comment   Specimen Description TISSUE RIGHT KNEE   Final    Special Requests TIBIAL PLATEAU   Final    Gram Stain     Final    Value: ABUNDANT WBC PRESENT, PREDOMINANTLY PMN     NO ORGANISMS SEEN     Gram Stain Report Called to,Read Back By and Verified With: Gram Stain Report Called to,Read Back By and Verified With: Cline Crock RN @ [phone] ON 03/08/12 BY Winn Jock Performed at [facility]   Culture NO GROWTH 1 DAY   Final    Report Status PENDING   Incomplete   GRAM STAIN     Status: Normal   Collection Time   03/08/12 10:55 AM      Component Value Range Status Comment   Specimen Description TISSUE RIGHT KNEE   Final    Special Requests TIBIAL PLATEAU   Final    Gram Stain     Final    Value: ABUNDANT WBC PRESENT, PREDOMINANTLY PMN     NO ORGANISMS SEEN     Gram Stain Report Called to,Read Back By and Verified With: Verner Chol AT 1217 03/08/12 BY K BARR   Report Status 03/08/2012 FINAL   Final   TISSUE CULTURE     Status: Normal (Preliminary  result)   Collection Time   03/08/12 11:01 AM      Component Value Range Status Comment   Specimen Description TISSUE RIGHT KNEE   Final    Special Requests RIGHT KNEE MEDIAL FEMUR TISSUE FOR CULTURE   Final    Gram Stain     Final    Value: FEW WBC PRESENT,BOTH PMN AND MONONUCLEAR     NO ORGANISMS SEEN     Gram  Stain Report Called to,Read Back By and Verified With: Gram Stain Report Called to,Read Back By and Verified With: Cline Crock RN @ 1217 ON 03/08/12 BY Raliegh Ip BARR Performed at [facility]   Culture NO GROWTH 1 DAY   Final    Report Status PENDING   Incomplete   ANAEROBIC CULTURE     Status: Normal (Preliminary result)   Collection Time   03/08/12 11:01 AM      Component Value Range Status Comment   Specimen Description TISSUE RIGHT KNEE   Final    Special Requests RIGHT KNEE MEDIAL FEMUR TISSUE FOR CULTURE   Final    Gram Stain PENDING   Incomplete    Culture     Final    Value: NO ANAEROBES ISOLATED; CULTURE IN PROGRESS FOR 5 DAYS   Report Status PENDING   Incomplete   GRAM STAIN     Status: Normal   Collection Time   03/08/12 11:01 AM      Component Value Range Status Comment   Specimen Description TISSUE RIGHT KNEE   Final    Special Requests RIGHT KNEE MEDIAL FEMUR TISSUE FOR CULTURE   Final    Gram Stain     Final    Value: FEW WBC PRESENT,BOTH PMN AND MONONUCLEAR     NO ORGANISMS SEEN     Gram Stain Report Called to,Read Back By and Verified With: Verner Chol AT 1217 03/08/12 BY K BARR   Report Status 03/08/2012 FINAL   Final   TISSUE CULTURE     Status: Normal (Preliminary result)   Collection Time   03/08/12 11:03 AM      Component Value Range Status Comment   Specimen Description TISSUE RIGHT KNEE   Final    Special Requests     Final    Value: RIGHT KNEE POCKET PROXIMAL MEDIAL TIBIA FOR CULTURE   Gram Stain     Final    Value: ABUNDANT WBC PRESENT,BOTH PMN AND MONONUCLEAR     NO ORGANISMS SEEN     Gram Stain Report Called to,Read Back By and Verified With: Gram Stain Report Called to,Read Back By and Verified With: Cline Crock RN @ [id number] ON 03/08/12 BY Winn Jock Performed at [facility]   Culture NO GROWTH 1 DAY   Final    Report Status PENDING   Incomplete   ANAEROBIC CULTURE     Status: Normal (Preliminary result)   Collection Time   03/08/12 11:03 AM       Component Value Range Status Comment   Specimen Description TISSUE RIGHT KNEE   Final    Special Requests     Final    Value: RIGHT KNEE POCKET PROXIMAL MEDIAL TIBIA FOR CULTURE   Gram Stain PENDING   Incomplete    Culture     Final    Value: NO ANAEROBES ISOLATED; CULTURE IN PROGRESS FOR 5 DAYS   Report Status PENDING   Incomplete   GRAM STAIN     Status: Normal   Collection  Time   03/08/12 11:03 AM      Component Value Range Status Comment   Specimen Description TISSUE RIGHT KNEE   Final    Special Requests     Final    Value: RIGHT KNEE POCKET PROXIMAL MEDIAL TIBIA FOR CULTURE   Gram Stain     Final    Value: ABUNDANT WBC PRESENT,BOTH PMN AND MONONUCLEAR     NO ORGANISMS SEEN     Gram Stain Report Called to,Read Back By and Verified With: Verner Chol AT 1235 03/08/12 BY K BARR   Report Status 03/08/2012 FINAL   Final   TISSUE CULTURE     Status: Normal (Preliminary result)   Collection Time   03/08/12 11:12 AM      Component Value Range Status Comment   Specimen Description TISSUE RIGHT KNEE   Final    Special Requests SYNOVIUM   Final    Gram Stain     Final    Value: NO WBC SEEN     NO SQUAMOUS EPITHELIAL CELLS SEEN     NO ORGANISMS SEEN   Culture NO GROWTH 1 DAY   Final    Report Status PENDING   Incomplete   TISSUE CULTURE     Status: Normal (Preliminary result)   Collection Time   03/08/12 11:19 AM      Component Value Range Status Comment   Specimen Description TISSUE RIGHT KNEE   Final    Special Requests RIGHT KNEE LATERAL TIBIAL PLATIAR TISSUE FOR CX'S   Final    Gram Stain     Final    Value: ABUNDANT WBC PRESENT,BOTH PMN AND MONONUCLEAR     NO ORGANISMS SEEN     Gram Stain Report Called to,Read Back By and Verified With: Gram Stain Report Called to,Read Back By and Verified With: Cline Crock RN @ [id number] ON 03/08/12 BY K BARR Performed at [facility]   Culture NO GROWTH 1 DAY   Final    Report Status PENDING   Incomplete   GRAM STAIN     Status: Normal     Collection Time   03/08/12 11:19 AM      Component Value Range Status Comment   Specimen Description TISSUE RIGHT KNEE   Final    Special Requests RIGHT KNEE LATERAL TIBIAL PLATIAR TISSUE FOR CX'S   Final    Gram Stain     Final    Value: ABUNDANT WBC PRESENT,BOTH PMN AND MONONUCLEAR     NO ORGANISMS SEEN     Gram Stain Report Called to,Read Back By and Verified With: Verner Chol AT 1235 03/08/12 BY K BARR   Report Status 03/08/2012 FINAL   Final   ANAEROBIC CULTURE     Status: Normal (Preliminary result)   Collection Time   03/08/12 11:19 AM      Component Value Range Status Comment   Specimen Description TISSUE RIGHT KNEE   Final    Special Requests RIGHT KNEE LATERAL TIBIAL PLATIAR TISSUE FOR CX'S   Final    Gram Stain PENDING   Incomplete    Culture     Final    Value: NO ANAEROBES ISOLATED; CULTURE IN PROGRESS FOR 5 DAYS   Report Status PENDING   Incomplete     Studies/Results: No results found.   Assessment/Plan: 71yo F with delayed prosthetic joint infection POD#1 from [facility] removal and antibiotic spacer. On vanco and aztreonam empirically.  - continue on antibiotics, await culture results to decide  on final regimen. Ok to have picc line placed  Jamy Cleckler Infectious Diseases 03/09/2012, 1:59 PM

## 2012-03-09 NOTE — Progress Notes (Addendum)
Physical Therapy Evaluation Note  Past Medical History  Diagnosis Date  . Osteoarthritis   . Edema, peripheral   . CAD (coronary artery disease)     s/p BMS to LAD  . Breast pain     left  . Hyperlipidemia   . Hypertension   . Sinusitis     acute- NOS  . Allergic rhinitis   . Blood in stool   . Adenocarcinoma, colon   . Anemia   . Sciatica   . Cystitis, acute   . Chronic diarrhea   . Nausea   . GERD (gastroesophageal reflux disease)   . Depression   . Anxiety   . Osteoarthritis   . IBS (irritable bowel syndrome)   . Degenerative disk disease   . Carpal tunnel syndrome   . Diabetes mellitus, type 2   . Family history of diabetes mellitus     Past Surgical History  Procedure Date  . Knee arthroscopy     bilateral  . Cholecystectomy   . Cardiac catheterization 12/10    LAD stent patent. Insignificant CAD, otherwise EF 60-65%  . Partial colectomy 11/2008    right, for adenocarcinoma  . Replacement total knee [id number]    right, complicated by hemarthrosis   . Hematoma evacuation     after knee replacement     03/09/12 0826  PT Visit Information  Last PT Received On 03/09/12  Assistance Needed +1  PT Time Calculation  PT Start Time 0826  PT Stop Time 0852  PT Time Calculation (min) 26 min  Subjective Data  Subjective Pt received supine in bed with pain at 7/10 R knee.  Precautions  Precautions Knee  Precaution Comments gentle R knee ROM per Aaron Edelman, PA due to spacer placement  Required Braces or Orthoses Knee Immobilizer - Right  Knee Immobilizer - Right On when out of bed or walking  Restrictions  Weight Bearing Restrictions Yes  RLE Weight Bearing NWB  RLE Partial Weight Bearing Percentage or Pounds 50%  Home Living  Lives With Alone  Available Help at Discharge (none - pt plans to go to [facility]  upon d/c)  Type of Cucumber Access Level entry  Home Layout One level  Bathroom Shower/Tub Walk-in Education officer, museum - rolling;Raised toilet seat with rails  Prior Function  Level of Independence Independent  Able to Take Stairs? Yes  Driving Yes  Vocation Retired  Engineer, petroleum No difficulties  Cognition  Overall Cognitive Status Appears within functional limits for tasks assessed/performed  Arousal/Alertness Awake/alert  Orientation Level Oriented X4 / Intact  Behavior During Session [facility] for tasks performed  Right Upper Extremity Assessment  RUE ROM/Strength/Tone WFL  Left Upper Extremity Assessment  LUE ROM/Strength/Tone WFL  Right Lower Extremity Assessment  RLE ROM/Strength/Tone Deficits;Due to pain (limited active R knee ROM due to sx, initiated quad set)  Left Lower Extremity Assessment  LLE ROM/Strength/Tone WFL  Trunk Assessment  Trunk Assessment Normal  Bed Mobility  Bed Mobility Supine to Sit  Supine to Sit 4: Min assist  Details for Bed Mobility Assistance use of bed rail, HOB at 25 degrees, mina for R LE management  Transfers  Transfers Sit to Stand;Stand to Sit  Sit to Stand 4: Min assist  Stand to Sit 4: Min assist;Without upper extremity assist;To chair/3-in-1  Details for Transfer Assistance min v/c's for R LE management and hand placement  Ambulation/Gait  Ambulation/Gait Assistance 4: Min assist  Ambulation Distance (Feet) 10 Feet  Assistive device Rolling walker  Ambulation/Gait Assistance Details v/c's for walker sequencing  Gait Pattern Step-to pattern;Decreased step length - right;Decreased stance time - right;Antalgic  General Gait Details v/c's to maintain L LE PWB  Stairs No  Exercises  Exercises Total Joint  Total Joint Exercises  Ankle Circles/Pumps AROM;Both;Supine;10 reps  Quad Sets AROM;Right;10 reps;Supine  PT - End of Session  Equipment Utilized During Treatment Gait belt  Activity Tolerance Patient limited by pain  Patient left in chair;with call bell/phone within reach  PT Assessment  Clinical Impression Statement  PT s/p R knee spacer placement presenting with R LE PWB, minimal R knee ROM per MD orders and increased assist for all mobilty. Patient to benefit from SNF placement upon d/c to maximize functional recovery for safe transition home. Pt independent PTA.  PT Recommendation/Assessment Patient needs continued PT services  PT Problem List Decreased strength;Decreased range of motion;Decreased activity tolerance;Decreased balance;Decreased mobility  Barriers to Discharge Decreased caregiver support  PT Therapy Diagnosis  Difficulty walking;Abnormality of gait;Generalized weakness;Acute pain  PT Plan  PT Frequency Min 6X/week  PT Treatment/Interventions Gait training;Stair training;DME instruction;Functional mobility training;Therapeutic activities;Therapeutic exercise  PT Recommendation  Follow Up Recommendations Skilled nursing facility  Equipment Recommended Defer to next venue  Individuals Consulted  Consulted and Agree with Results and Recommendations Patient  Acute Rehab PT Goals  PT Goal Formulation With patient  Time For Goal Achievement 03/23/12  Potential to Achieve Goals Good  Pt will go Supine/Side to Sit with modified independence;with HOB 0 degrees  PT Goal: Supine/Side to Sit - Progress Goal set today  Pt will go Sit to Stand with modified independence;with upper extremity assist (up to RW.)  PT Goal: Sit to Stand - Progress Goal set today  Pt will Ambulate >150 feet;with modified independence;with rolling walker  PT Goal: Ambulate - Progress Goal set today  Pt will Perform Home Exercise Program Independently  PT Goal: Perform Home Exercise Program - Progress Goal set today  PT General Charges  $$ ACUTE PT VISIT 1 Procedure  PT Evaluation  $Initial PT Evaluation Tier II 1 Procedure  Written Expression  Dominant Hand Right    Pain: 7/10 R knee pain initially, 9/10 R knee pain with ambulation  Kittie Plater, PT, DPT Pager #: [phone] Office #: [phone]

## 2012-03-09 NOTE — Progress Notes (Signed)
CARE MANAGEMENT NOTE 03/09/2012  Patient:  Tonya [name], Tonya [name]   Account Number:  [id number]  Date Initiated:  03/08/2012  Documentation initiated by:  Magdalen Spatz  Subjective/Objective Assessment:   Exploration of right total knee replacement with synovectomy and      submission of pathology specimens.  2. Removal of total knee replacement components.  3. Insertion of antibiotic spacers.     Action/Plan:   Patient is for shorterm rehab at [facility]. Social Worker is aware.   Anticipated DC Date:  03/11/2012   Anticipated DC Plan:  SKILLED NURSING FACILITY  In-house referral  Clinical Social Worker      DC Planning Services  CM consult      Choice offered to / List presented to:             Status of service:  Completed, signed off  Discharge Disposition:  [place]

## 2012-03-10 ENCOUNTER — Encounter (HOSPITAL_COMMUNITY): Payer: Self-pay | Admitting: Orthopaedic Surgery

## 2012-03-10 LAB — BASIC METABOLIC PANEL
Calcium: 8.3 mg/dL — ABNORMAL LOW (ref 8.4–10.5)
Creatinine, Ser: 1.02 mg/dL (ref 0.50–1.10)
GFR calc Af Amer: 63 mL/min — ABNORMAL LOW (ref >90)
GFR calc non Af Amer: 54 mL/min — ABNORMAL LOW (ref >90)
Sodium: 134 mEq/L — ABNORMAL LOW (ref 135–145)

## 2012-03-10 LAB — GLUCOSE, CAPILLARY
Glucose-Capillary: 150 mg/dL — ABNORMAL HIGH (ref 70–99)
Glucose-Capillary: 137 mg/dL — ABNORMAL HIGH (ref 70–99)
Glucose-Capillary: 128 mg/dL — ABNORMAL HIGH (ref 70–99)
Glucose-Capillary: 124 mg/dL — ABNORMAL HIGH (ref 70–99)

## 2012-03-10 LAB — CBC
MCH: 26.4 pg (ref 26.0–34.0)
MCHC: 32.2 g/dL (ref 30.0–36.0)
MCV: 81.9 fL (ref 78.0–100.0)
Platelets: 194 10*3/uL (ref 150–400)
RBC: 2.99 MIL/uL — ABNORMAL LOW (ref 3.87–5.11)
RDW: 14.1 % (ref 11.5–15.5)

## 2012-03-10 LAB — PREPARE RBC (CROSSMATCH)

## 2012-03-10 MED ORDER — ACETAMINOPHEN 325 MG PO TABS
650.0000 mg | ORAL_TABLET | Freq: Four times a day (QID) | ORAL | Status: DC | PRN
  Administered 2012-03-10: 650 mg via ORAL
  Filled 2012-03-10: qty 2

## 2012-03-10 MED ORDER — RIVAROXABAN 10 MG PO TABS: 10.0000 mg | ORAL_TABLET | Freq: Every day | ORAL | Status: DC

## 2012-03-10 MED ORDER — METHOCARBAMOL 500 MG PO TABS: 500.0000 mg | ORAL_TABLET | Freq: Three times a day (TID) | ORAL | Status: DC | PRN

## 2012-03-10 MED ORDER — DEXTROSE 5 % IV SOLN
2.0000 g | INTRAVENOUS | Status: DC
  Administered 2012-03-10: 2 g via INTRAVENOUS
  Filled 2012-03-10 (×3): qty 2

## 2012-03-10 MED ORDER — ACETAMINOPHEN 325 MG PO TABS
650.0000 mg | ORAL_TABLET | Freq: Once | ORAL | Status: AC
  Administered 2012-03-10: 650 mg via ORAL

## 2012-03-10 MED ORDER — OXYCODONE HCL 5 MG PO TABS: 5.0000 mg | ORAL_TABLET | ORAL | Status: AC | PRN

## 2012-03-10 MED ORDER — FUROSEMIDE 10 MG/ML IJ SOLN: 20.0000 mg | Freq: Once | INTRAMUSCULAR | Status: DC

## 2012-03-10 NOTE — Discharge Summary (Signed)
[name], MD   Biagio Borg, PA-C [place], [place], [place]  [address]                             [phone]  PATIENT ID: [id number] ZINN        MRN:  [id number]          DOB/AGE: 1941-03-08 / 71 y.o.    DISCHARGE SUMMARY  ADMISSION DATE:    03/08/2012 DISCHARGE DATE:   03/11/2012   ADMISSION DIAGNOSIS: loosening vs possible infection right total knee    DISCHARGE DIAGNOSIS:  loosening vs possible infection right total knee    ADDITIONAL DIAGNOSIS:  Past Medical History  Diagnosis Date  . Osteoarthritis   . Edema, peripheral   . CAD (coronary artery disease)     s/p BMS to LAD  . Breast pain     left  . Hyperlipidemia   . Hypertension   . Sinusitis     acute- NOS  . Allergic rhinitis   . Blood in stool   . Adenocarcinoma, colon   . Anemia   . Sciatica   . Cystitis, acute   . Chronic diarrhea   . Nausea   . GERD (gastroesophageal reflux disease)   . Depression   . Anxiety   . Osteoarthritis   . IBS (irritable bowel syndrome)   . Degenerative disk disease   . Carpal tunnel syndrome   . Diabetes mellitus, type 2   . Family history of diabetes mellitus     PROCEDURE: Procedure(s): RIGHT LEG REMOVAL OF TOTAL KNEE AND PLACEMENT OF ANTIBIOTIC SPACER  on 03/08/2012  CONSULTS:   INFECTIOUS DISEASE  HISTORY: Tonya [name] is a very pleasant 72 year old white female who has been seen with a painful right total knee arthroplasty. This was performed in August of 2011 and she started having problems with it fairly quickly. She had a very large hematoma which had to be evacuated after her surgery. She had done well until March 30, 2011. At that time she had some pain and discomfort. At that time she states that whenever she was on her feet for any particular length of time she had some discomfort but overall though there was no sign of any infection or problems. She returned back on the 18th of March 2013 and she started having about a week to week and a half pain  in her knee. There was no real history of injury or trauma. No fever, shakes or chills. She was simply out shopping with a friend and she started experiencing some knee pain. It was more of a constant moderately severe pain with a sharp quality. She did have some problems sleeping. The knee was aspirated at that time of some cloudy orange brown fluid and this was sent for evaluation. Her studies came back with no organisms on the Gram stain and no growth for 3 days. At that time she had an 11,400 white count with sedimentation rate of 55 and C-reactive protein 1.54. Cell count was 17,065 white cells, 90% neutrophils and 10% monocytes or macrophages. No crystals were noted. She continues to have symptoms. A bone scan and was obtained. This revealed abnormal uptake on all 3 phases of the bone scan within the right knee involving the femoral condyles and tibial plateau consistent with joint infection or possible prosthetic loosening.   HOSPITAL COURSE:  Tonya [name] is a 71 y.o. admitted on 03/08/2012 and found  to have a diagnosis of loosening vs possible infection right total knee.  After appropriate laboratory studies were obtained  they were taken to the operating room on 03/08/2012 and underwent Procedure(s): RIGHT LEG REMOVAL OF TOTAL KNEE AND PLACEMENT OF ANTIBIOTIC SPACER     They were given perioperative antibiotics: after obtaining cultures and fresh frozen specimens.  Anti-infectives     Start     Dose/Rate Route Frequency Ordered Stop   03/11/12 0000   dextrose 5 % SOLN 50 mL with cefTRIAXone 2 G SOLR 2 g        2 g 100 mL/hr over 30 Minutes Intravenous Every 24 hours 03/11/12 0816     03/11/12 0000   sodium chloride 0.9 % SOLN 150 mL with vancomycin 1000 MG SOLR 750 mg        750 mg 150 mL/hr over 60 Minutes Intravenous Every 12 hours 03/11/12 0816     03/10/12 1500   cefTRIAXone (ROCEPHIN) 2 g in dextrose 5 % 50 mL IVPB        2 g 100 mL/hr over 30 Minutes Intravenous Every 24 hours  03/10/12 1350     03/08/12 2200   aztreonam (AZACTAM) 1 g in dextrose 5 % 50 mL IVPB  Status:  Discontinued        1 g 100 mL/hr over 30 Minutes Intravenous 3 times per day 03/08/12 1852 03/10/12 1350   03/08/12 1900   aztreonam (AZACTAM) injection 2 g  Status:  Discontinued        2 g Intravenous 4 times per day 03/08/12 1724 03/08/12 1851   03/08/12 1800   vancomycin (VANCOCIN) 750 mg in sodium chloride 0.9 % 150 mL IVPB        750 mg 150 mL/hr over 60 Minutes Intravenous Every 12 hours 03/08/12 1538     03/08/12 1212   vancomycin (VANCOCIN) powder  Status:  Discontinued          As needed 03/08/12 1214 03/08/12 1312   03/08/12 1210   gentamycin 80 mg in 0.9% normal saline 250 mL irrigation  Status:  Discontinued          As needed 03/08/12 1212 03/08/12 1312   03/08/12 1209   tobramycin (NEBCIN) powder  Status:  Discontinued          As needed 03/08/12 1210 03/08/12 1312        .  Tolerated the procedure well.  Placed with a foley intraoperatively.  Given Ofirmev at induction and for 48 hours.    POD #1, allowed out of bed to a chair.  PT for ambulation and exercise program.  Foley D/C'd in morning.  IV saline locked.  O2 discontionued.  Hemovac pulled.   POD #2, continued PT and ambulation.  Some nausea but very weak and pale.  BP down from baseline.  Hgb was 7.9.  Given 2 units prbc's. Marland Kitchen POD #3, continued PT.  PICC line ordered.  Still has some nausea but has not asked for any medications for it.  Bowel sounds active.  The remainder of the hospital course was dedicated to ambulation and strengthening and IV antibiotics.   The patient was discharged on 3 Days Post-Op in  Stable condition.  Blood products given :two units PRBC  DIAGNOSTIC STUDIES: Recent vital signs:  Patient Vitals for the past 24 hrs:  BP Temp Temp src Pulse Resp SpO2  03/11/12 0600 160/72 mmHg 99.6 F (37.6 C) Oral 79  12  95 %  03/10/12 2300 126/72 mmHg 99.9 F (37.7 C) Oral 72  18  -  03/10/12  2230 148/56 mmHg 99.3 F (37.4 C) Oral 69  18  -  03/10/12 2133 140/61 mmHg 100.1 F (37.8 C) Oral 73  18  -  03/10/12 2030 157/69 mmHg 100.1 F (37.8 C) Oral 71  16  -  03/10/12 1931 150/84 mmHg 100.3 F (37.9 C) - 68  16  -  03/10/12 1830 157/70 mmHg 100.3 F (37.9 C) - 69  16  -  03/10/12 1715 156/68 mmHg 98.8 F (37.1 C) - 64  18  -  03/10/12 1615 144/65 mmHg 100.7 F (38.2 C) - 64  16  -  03/10/12 1500 130/65 mmHg 100.4 F (38 C) - 63  16  -  03/10/12 1437 127/68 mmHg 99.9 F (37.7 C) - 65  16  90 %  03/10/12 1013 118/48 mmHg - - - - -       Recent laboratory studies:  Basename 2012/03/14 0620 03-14-12 0023 03/10/12 0555 03/09/12 0500  WBC 11.5* 11.8* 11.2* 10.4  HGB 10.3* 9.7* 7.9* 8.8*  HCT 29.9* 28.7* 24.5* 27.4*  PLT 196 182 194 210    Basename 14-Mar-2012 0620 03/10/12 0555 03/09/12 0500  NA 137 134* 136  K 3.8 3.9 3.5  CL 103 102 101  CO2 22 22 24   BUN 12 12 14   CREATININE 0.92 1.02 1.09  GLUCOSE 145* 163* 150*  CALCIUM 8.7 8.3* 8.2*   Lab Results  Component Value Date   INR 1.04 03/02/2012   INR 1.78* 05/31/2010   INR 2.21* 05/30/2010     Recent Radiographic Studies :   Chest 2 View  03/02/2012  *RADIOLOGY REPORT*  Clinical Data: Preop right total knee revision  CHEST - 2 VIEW  Comparison: 04/24/2010  Findings: Lungs are essentially clear. No pleural effusion or pneumothorax.  Stable mild cardiomegaly.  Degenerative changes of the visualized thoracolumbar spine.  IMPRESSION: No evidence of acute cardiopulmonary disease.  Stable mild cardiomegaly.  Original Report Authenticated By: Julian Hy, M.D.    DISCHARGE INSTRUCTIONS: Discharge Orders    Future Orders Please Complete By Expires   Diet Carb Modified      Call MD / Call 911      Comments:   If you experience chest pain or shortness of breath, CALL 911 and be transported to the hospital emergency room.  If you develope a fever above 101 F, pus (white drainage) or increased drainage or redness at the  wound, or calf pain, call your surgeon's office.   Constipation Prevention      Comments:   Drink plenty of fluids.  Prune juice may be helpful.  You may use a stool softener, such as Colace (over the counter) 100 mg twice a day.  Use MiraLax (over the counter) for constipation as needed.   Patient may shower      Comments:   You may shower without a dressing once there is no drainage.  Do not wash over the wound.  If drainage remains, cover wound with plastic wrap and then shower.   Driving restrictions      Comments:   No driving for 6 weeks   Lifting restrictions      Comments:   No lifting for 6 weeks   TED hose      Comments:   Use stockings (TED hose) for 3 weeks on RIGHT leg(s).  You may remove them at night for sleeping.  Change dressing      Comments:   Change dressing on SATURDAY, then change the dressing daily with sterile 4 x 4 inch gauze dressing and apply TED hose.  You may clean the incision with alcohol prior to redressing.   Do not put a pillow under the knee. Place it under the heel.      Increase activity slowly as tolerated      Scheduling Instructions:   WEIGHT BEARING AS TAUGHT IN PT AT 50%   Comments:   WEIGHT BEARING AT 50% WITH WALKER AND KNEE IMMOBILIZER.  DOES NOT NEED IMMOBILIZER ON UNLESS AMBULATING.  PT MAY DO GENTLE ROM OF THE KNEE AS TOLERATED.  PT FOR STRENGTHENING (I.E. SLR, HIP ABDUCTION)      DISCHARGE MEDICATIONS:   Medication List  As of 03/11/2012  8:26 AM   STOP taking these medications         aspirin 81 MG tablet      cholestyramine light 4 GM/DOSE powder      Fish Oil Oil      multivitamin tablet      nabumetone 750 MG tablet      traMADol 50 MG tablet         TAKE these medications         acetaminophen 325 MG tablet   Commonly known as: TYLENOL   Take 2 tablets (650 mg total) by mouth every 6 (six) hours as needed for fever (temp greater than 100.0).      atorvastatin 20 MG tablet   Commonly known as: LIPITOR   Take 20  mg by mouth daily.      calcium-vitamin D 500-200 MG-UNIT per tablet   Commonly known as: OSCAL WITH D   Take 1 tablet by mouth 2 (two) times daily.      carvedilol 12.5 MG tablet   Commonly known as: COREG   Take 1 tablet (12.5 mg total) by mouth 2 (two) times daily with a meal.      PREVALITE 4 G packet   Generic drug: cholestyramine light   TAKE 1 PACKET (4 G TOTAL) BY MOUTH 2 (TWO) TIMES DAILY WITH A MEAL.      cholestyramine light 4 G packet   Commonly known as: PREVALITE   Take 4 g by mouth 2 (two) times daily.      citalopram 40 MG tablet   Commonly known as: CELEXA   Take 1 tablet (40 mg total) by mouth daily.      dextrose 5 % SOLN 50 mL with cefTRIAXone 2 G SOLR 2 g   Inject 2 g into the vein daily.      diltiazem 240 MG 24 hr capsule   Commonly known as: CARDIZEM CD   Take 1 capsule (240 mg total) by mouth daily.      ferrous sulfate 325 (65 FE) MG EC tablet   Take 325 mg by mouth 2 (two) times daily.      glyBURIDE-metformin 1.25-250 MG per tablet   Commonly known as: GLUCOVANCE   Take 1 tablet by mouth 2 (two) times daily with a meal.      hydrochlorothiazide 25 MG tablet   Commonly known as: HYDRODIURIL   Take 1 tablet (25 mg total) by mouth daily.      methocarbamol 500 MG tablet   Commonly known as: ROBAXIN   Take 1 tablet (500 mg total) by mouth every 8 (eight) hours as needed (spasms).      omeprazole  20 MG capsule   Commonly known as: PRILOSEC   Take 20 mg by mouth daily.      oxyCODONE 5 MG immediate release tablet   Commonly known as: Oxy IR/ROXICODONE   Take 1-2 tablets (5-10 mg total) by mouth every 4 (four) hours as needed for pain.      potassium chloride SA 20 MEQ tablet   Commonly known as: K-DUR,KLOR-CON   Take 1 tablet (20 mEq total) by mouth 2 (two) times daily.      rivaroxaban 10 MG Tabs tablet   Commonly known as: XARELTO   Take 1 tablet (10 mg total) by mouth daily.      sodium chloride 0.9 % SOLN 150 mL with vancomycin  1000 MG SOLR 750 mg   Inject 750 mg into the vein every 12 (twelve) hours.            FOLLOW UP VISIT:   Follow-up Information    Follow up with Garald Balding, MD on 03/21/2012.   Contact information:   [address] [place] [phone]          DISPOSITION:  Skilled nursing facility    CONDITION:  Stable   Tonya [name] 03/11/2012, 8:26 AM

## 2012-03-10 NOTE — Progress Notes (Addendum)
INFECTIOUS DISEASE PROGRESS NOTE  ID: LOUELLEN BRALY is a 71 y.o. 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 Cultures pending.on vanco and aztreo   Subjective: Had isolated low grade fever of 100.5 yesterday afternoon, but now afebrile in the last 24hrs. Patient had some knee pain with working with PT. She states that she has taken bactrim, keflex without difficulty. Has received cefazolin in pre-op OR without difficulty  Abtx:  aztreo #2 vanco #2  Medications:     . acetaminophen  1,000 mg Intravenous Q6H  . acetaminophen  650 mg Oral Once  . atorvastatin  20 mg Oral q1800  . aztreonam  1 g Intravenous Q8H  . carvedilol  12.5 mg Oral BID WC  . cholestyramine light  4 g Oral Q12H  . citalopram  40 mg Oral Daily  . diltiazem  240 mg Oral Daily  . ferrous sulfate  325 mg Oral BID WC  . glyBURIDE  1.25 mg Oral BID WC  . hydrochlorothiazide  25 mg Oral Daily  . insulin aspart  0-15 Units Subcutaneous TID WC  . insulin aspart  0-5 Units Subcutaneous QHS  . pantoprazole  40 mg Oral Q1200  . potassium chloride SA  20 mEq Oral BID  . rivaroxaban  10 mg Oral Daily  . senna  1 tablet Oral BID  . vancomycin  750 mg Intravenous Q12H  . DISCONTD: furosemide  20 mg Intravenous Once    Objective: Vital signs in last 24 hours: Temp:  [98.7 F (37.1 C)-99 F (37.2 C)] 98.7 F (37.1 C) (06/13 0649) Pulse Rate:  [57-65] 57  (06/13 0649) Resp:  [16-18] 16  (06/13 0649) BP: (118-130)/(40-48) 118/48 mmHg (06/13 1013) SpO2:  [87 %-97 %] 95 % (06/13 0649)  gen = a x o by 3 in NAD HEENT= MMM, PERRLA, EOMI Pulm= CTAB, no w/c/r Cors= nl s1,s2 Abd= NTND, Bs+ Ext= right leg wrapped, accordian drain in place  Lab Results  Basename 03/10/12 0555 03/09/12 0500  WBC 11.2* 10.4  HGB 7.9* 8.8*  HCT 24.5* 27.4*  NA 134* 136  K 3.9 3.5  CL 102 101  CO2 22 24  BUN  12 14  CREATININE 1.02 1.09  GLU -- --   Liver Panel No results found for this basename: PROT:2,ALBUMIN:2,AST:2,ALT:2,ALKPHOS:2,BILITOT:2,BILIDIR:2,IBILI:2 in the last 72 hours Sedimentation Rate  Basename 03/08/12 1800  ESRSEDRATE 50*   C-Reactive Protein  Basename 03/08/12 1800  CRP 1.29*    Microbiology: Recent Results (from the past 240 hour(s))  SURGICAL PCR SCREEN     Status: Normal   Collection Time   03/02/12 10:30 AM      Component Value Range Status Comment   MRSA, PCR NEGATIVE  NEGATIVE Final    Staphylococcus aureus NEGATIVE  NEGATIVE Final   URINE CULTURE     Status: Normal   Collection Time   03/02/12 10:31 AM      Component Value Range Status Comment   Specimen Description URINE, CLEAN CATCH   Final    Special Requests NONE   Final    Culture  Setup Time [id number]   Final    Colony Count NO GROWTH   Final    Culture NO GROWTH   Final    Report Status 03/03/2012 FINAL   Final   AFB CULTURE WITH SMEAR     Status: Normal (Preliminary result)   Collection Time  03/08/12 10:52 AM      Component Value Range Status Comment   Specimen Description FLUID SYNOVIAL RIGHT KNEE   Final    Special Requests ON SWAB   Final    ACID FAST SMEAR NO ACID FAST BACILLI SEEN   Final    Culture     Final    Value: CULTURE WILL BE EXAMINED FOR 6 WEEKS BEFORE ISSUING A FINAL REPORT   Report Status PENDING   Incomplete   FUNGUS CULTURE W SMEAR     Status: Normal (Preliminary result)   Collection Time   03/08/12 10:52 AM      Component Value Range Status Comment   Specimen Description FLUID SYNOVIAL RIGHT KNEE   Final    Special Requests ON SWAB   Final    Fungal Smear NO YEAST OR FUNGAL ELEMENTS SEEN   Final    Culture CULTURE IN PROGRESS FOR FOUR WEEKS   Final    Report Status PENDING   Incomplete   ANAEROBIC CULTURE     Status: Normal (Preliminary result)   Collection Time   03/08/12 10:52 AM      Component Value Range Status Comment   Specimen Description FLUID SYNOVIAL  RIGHT KNEE   Final    Special Requests ON SWAB   Final    Gram Stain PENDING   Incomplete    Culture     Final    Value: NO ANAEROBES ISOLATED; CULTURE IN PROGRESS FOR 5 DAYS   Report Status PENDING   Incomplete   BODY FLUID CULTURE     Status: Normal (Preliminary result)   Collection Time   03/08/12 10:55 AM      Component Value Range Status Comment   Specimen Description FLUID SYNOVIAL RIGHT KNEE   Final    Special Requests ON SWAB   Final    Gram Stain     Final    Value: FEW WBC PRESENT,BOTH PMN AND MONONUCLEAR     NO ORGANISMS SEEN     Gram Stain Report Called to,Read Back By and Verified With: Gram Stain Report Called to,Read Back By and Verified With: Cline Crock RN @ [phone] ON 03/08/12 BY Raliegh Ip BARR Performed at [facility]   Culture NO GROWTH 1 DAY   Final    Report Status PENDING   Incomplete   GRAM STAIN     Status: Normal   Collection Time   03/08/12 10:55 AM      Component Value Range Status Comment   Specimen Description FLUID SYNOVIAL RIGHT KNEE   Final    Special Requests NONE   Final    Gram Stain     Final    Value: FEW WBC PRESENT,BOTH PMN AND MONONUCLEAR     NO ORGANISMS SEEN     Gram Stain Report Called to,Read Back By and Verified With: Verner Chol AT 1217 03/08/12 BY K BARR   Report Status 03/08/2012 FINAL   Final   TISSUE CULTURE     Status: Normal (Preliminary result)   Collection Time   03/08/12 10:55 AM      Component Value Range Status Comment   Specimen Description TISSUE RIGHT KNEE   Final    Special Requests TIBIAL PLATEAU   Final    Gram Stain     Final    Value: ABUNDANT WBC PRESENT, PREDOMINANTLY PMN     NO ORGANISMS SEEN     Gram Stain Report Called to,Read Back By and Verified With: Gram  Stain Report Called to,Read Back By and Verified With: Cline Crock RN @ [phone] ON 03/08/12 BY Winn Jock Performed at [facility]   Culture NO GROWTH 2 DAYS   Final    Report Status PENDING   Incomplete   GRAM STAIN     Status: Normal   Collection Time    03/08/12 10:55 AM      Component Value Range Status Comment   Specimen Description TISSUE RIGHT KNEE   Final    Special Requests TIBIAL PLATEAU   Final    Gram Stain     Final    Value: ABUNDANT WBC PRESENT, PREDOMINANTLY PMN     NO ORGANISMS SEEN     Gram Stain Report Called to,Read Back By and Verified With: Verner Chol AT 1217 03/08/12 BY K BARR   Report Status 03/08/2012 FINAL   Final   TISSUE CULTURE     Status: Normal (Preliminary result)   Collection Time   03/08/12 11:01 AM      Component Value Range Status Comment   Specimen Description TISSUE RIGHT KNEE   Final    Special Requests RIGHT KNEE MEDIAL FEMUR TISSUE FOR CULTURE   Final    Gram Stain     Final    Value: FEW WBC PRESENT,BOTH PMN AND MONONUCLEAR     NO ORGANISMS SEEN     Gram Stain Report Called to,Read Back By and Verified With: Gram Stain Report Called to,Read Back By and Verified With: Cline Crock RN @ 1217 ON 03/08/12 BY Raliegh Ip BARR Performed at [facility]   Culture NO GROWTH 2 DAYS   Final    Report Status PENDING   Incomplete   ANAEROBIC CULTURE     Status: Normal (Preliminary result)   Collection Time   03/08/12 11:01 AM      Component Value Range Status Comment   Specimen Description TISSUE RIGHT KNEE   Final    Special Requests RIGHT KNEE MEDIAL FEMUR TISSUE FOR CULTURE   Final    Gram Stain PENDING   Incomplete    Culture     Final    Value: NO ANAEROBES ISOLATED; CULTURE IN PROGRESS FOR 5 DAYS   Report Status PENDING   Incomplete   FUNGUS CULTURE W SMEAR     Status: Normal (Preliminary result)   Collection Time   03/08/12 11:01 AM      Component Value Range Status Comment   Specimen Description TISSUE RIGHT KNEE   Final    Special Requests RIGHT KNEE MEDIAL FEMUR TISSUE FOR CULTURE   Final    Fungal Smear NO YEAST OR FUNGAL ELEMENTS SEEN   Final    Culture CULTURE IN PROGRESS FOR FOUR WEEKS   Final    Report Status PENDING   Incomplete   GRAM STAIN     Status: Normal   Collection Time    03/08/12 11:01 AM      Component Value Range Status Comment   Specimen Description TISSUE RIGHT KNEE   Final    Special Requests RIGHT KNEE MEDIAL FEMUR TISSUE FOR CULTURE   Final    Gram Stain     Final    Value: FEW WBC PRESENT,BOTH PMN AND MONONUCLEAR     NO ORGANISMS SEEN     Gram Stain Report Called to,Read Back By and Verified With: Verner Chol AT 1217 03/08/12 BY K BARR   Report Status 03/08/2012 FINAL   Final   TISSUE CULTURE  Status: Normal (Preliminary result)   Collection Time   03/08/12 11:03 AM      Component Value Range Status Comment   Specimen Description TISSUE RIGHT KNEE   Final    Special Requests     Final    Value: RIGHT KNEE POCKET PROXIMAL MEDIAL TIBIA FOR CULTURE   Gram Stain     Final    Value: ABUNDANT WBC PRESENT,BOTH PMN AND MONONUCLEAR     NO ORGANISMS SEEN     Gram Stain Report Called to,Read Back By and Verified With: Gram Stain Report Called to,Read Back By and Verified With: Cline Crock RN @ [id number] ON 03/08/12 BY K BARR Performed at [facility]   Culture NO GROWTH 2 DAYS   Final    Report Status PENDING   Incomplete   ANAEROBIC CULTURE     Status: Normal (Preliminary result)   Collection Time   03/08/12 11:03 AM      Component Value Range Status Comment   Specimen Description TISSUE RIGHT KNEE   Final    Special Requests     Final    Value: RIGHT KNEE POCKET PROXIMAL MEDIAL TIBIA FOR CULTURE   Gram Stain PENDING   Incomplete    Culture     Final    Value: NO ANAEROBES ISOLATED; CULTURE IN PROGRESS FOR 5 DAYS   Report Status PENDING   Incomplete   GRAM STAIN     Status: Normal   Collection Time   03/08/12 11:03 AM      Component Value Range Status Comment   Specimen Description TISSUE RIGHT KNEE   Final    Special Requests     Final    Value: RIGHT KNEE POCKET PROXIMAL MEDIAL TIBIA FOR CULTURE   Gram Stain     Final    Value: ABUNDANT WBC PRESENT,BOTH PMN AND MONONUCLEAR     NO ORGANISMS SEEN     Gram Stain Report Called to,Read Back  By and Verified With: Verner Chol AT 1235 03/08/12 BY K BARR   Report Status 03/08/2012 FINAL   Final   TISSUE CULTURE     Status: Normal (Preliminary result)   Collection Time   03/08/12 11:12 AM      Component Value Range Status Comment   Specimen Description TISSUE RIGHT KNEE   Final    Special Requests SYNOVIUM   Final    Gram Stain     Final    Value: NO WBC SEEN     NO SQUAMOUS EPITHELIAL CELLS SEEN     NO ORGANISMS SEEN   Culture NO GROWTH 2 DAYS   Final    Report Status PENDING   Incomplete   TISSUE CULTURE     Status: Normal (Preliminary result)   Collection Time   03/08/12 11:19 AM      Component Value Range Status Comment   Specimen Description TISSUE RIGHT KNEE   Final    Special Requests RIGHT KNEE LATERAL TIBIAL PLATIAR TISSUE FOR CX'S   Final    Gram Stain     Final    Value: ABUNDANT WBC PRESENT,BOTH PMN AND MONONUCLEAR     NO ORGANISMS SEEN     Gram Stain Report Called to,Read Back By and Verified With: Gram Stain Report Called to,Read Back By and Verified With: Cline Crock RN @ [id number] ON 03/08/12 BY Winn Jock Performed at [facility]   Culture NO GROWTH 2 DAYS   Final    Report Status PENDING  Incomplete   GRAM STAIN     Status: Normal   Collection Time   03/08/12 11:19 AM      Component Value Range Status Comment   Specimen Description TISSUE RIGHT KNEE   Final    Special Requests RIGHT KNEE LATERAL TIBIAL PLATIAR TISSUE FOR CX'S   Final    Gram Stain     Final    Value: ABUNDANT WBC PRESENT,BOTH PMN AND MONONUCLEAR     NO ORGANISMS SEEN     Gram Stain Report Called to,Read Back By and Verified With: [facility] AT 1235 03/08/12 BY K BARR   Report Status 03/08/2012 FINAL   Final   ANAEROBIC CULTURE     Status: Normal (Preliminary result)   Collection Time   03/08/12 11:19 AM      Component Value Range Status Comment   Specimen Description TISSUE RIGHT KNEE   Final    Special Requests RIGHT KNEE LATERAL TIBIAL PLATIAR TISSUE FOR CX'S   Final    Gram  Stain     Final    Value: ABUNDANT WBC PRESENT,BOTH PMN AND MONONUCLEAR     NO ORGANISMS SEEN     Gram Stain Report Called to,Read Back By and Verified With: Gram Stain Report Called to,Read Back By and Verified With: Cline Crock AT 1235 ON [id number] BY K BARR Performed at [facility]   Culture     Final    Value: NO ANAEROBES ISOLATED; CULTURE IN PROGRESS FOR 5 DAYS   Report Status PENDING   Incomplete   CULTURE, BLOOD (ROUTINE X 2)     Status: Normal (Preliminary result)   Collection Time   03/08/12  5:43 PM      Component Value Range Status Comment   Specimen Description BLOOD LEFT HAND   Final    Special Requests BOTTLES DRAWN AEROBIC AND ANAEROBIC 10CC EACH   Final    Culture  Setup Time [id number]   Final    Culture     Final    Value:        BLOOD CULTURE RECEIVED NO GROWTH TO DATE CULTURE WILL BE HELD FOR 5 DAYS BEFORE ISSUING A FINAL NEGATIVE REPORT   Report Status PENDING   Incomplete   CULTURE, BLOOD (ROUTINE X 2)     Status: Normal (Preliminary result)   Collection Time   03/08/12  6:00 PM      Component Value Range Status Comment   Specimen Description BLOOD LEFT FOREARM   Final    Special Requests     Final    Value: BOTTLES DRAWN AEROBIC AND ANAEROBIC 10CC BLUE  5CC RED   Culture  Setup Time [id number]   Final    Culture     Final    Value:        BLOOD CULTURE RECEIVED NO GROWTH TO DATE CULTURE WILL BE HELD FOR 5 DAYS BEFORE ISSUING A FINAL NEGATIVE REPORT   Report Status PENDING   Incomplete     Studies/Results: No results found.   Assessment/Plan: 71yo F with delayed prosthetic joint infection POD#1 from [facility] removal and antibiotic spacer. On vanco and aztreonam empirically.all cultures are no growth at 48hrs.  - we will have the patient's final antibiotic regimen be vancomycin and ceftriaxone 2gm IV daily. Will discontinue aztreonam.  - length of treatment will be 6 wk - pt will need weekly vanco trough and BMP checked, vanco trough goal of 15-20. -  will arrange follow up in ID  clinic in 6wks. Please continue antibiotics until she is seen in ID clinic  [facility] 03/10/2012, 1:47 PM

## 2012-03-10 NOTE — Clinical Social Work Psychosocial (Signed)
     Clinical Social Work Department BRIEF PSYCHOSOCIAL ASSESSMENT 03/10/2012  Patient:  Tonya [name], Tonya [name]     Account Number:  [id number]     Admit date:  03/08/2012  Clinical Social Worker:  Caryl Comes  Date/Time:  03/09/2012 04:00 PM  Referred by:  Physician  Date Referred:  03/09/2012 Referred for  SNF Placement   Other Referral:   Interview type:  Other - See comment Other interview type:   Patient and daugher- Stephanie    PSYCHOSOCIAL DATA Living Status:  ALONE Admitted from facility:   Level of care:   Primary support name:  Colletta [place] [id number] or Anderson Malta [id number] Primary support relationship to patient:  CHILD, ADULT Degree of support available:   Colletta [place] and Anderson [place] are both daughters- very invovled. There is no POA.  Both daughters are invovled.    CURRENT CONCERNS Current Concerns  Post-Acute Placement   Other Concerns:   Patient has past and current psych history and is on psych meds. Will require 30 day return home statement by MD for short stay [facility].    SOCIAL WORK ASSESSMENT / PLAN Patient kreferred to CSW for short term SNF. She lives alone but has strong family support.  Patient desires placement at [facility] if possible. Bed search process discussed and will be intiatied.  Will request [facility] number.   Assessment/plan status:  Psychosocial Support/Ongoing Assessment of Needs Other assessment/ plan:   Information/referral to community resources:   SNF bed list given  Discussed process for after care needs when she leaves SNF- facility can arrange for any DME/HH needs.    PATIENTS/FAMILYS RESPONSE TO PLAN OF CARE: Patient and daughter are very appreciative and pleased with d/c plan.  Patient states she wants her daughter's involved as she can be "forgetful" at times.  Daughter plans to transport via car.

## 2012-03-10 NOTE — Clinical Social Work Placement (Addendum)
    Clinical Social Work Department CLINICAL SOCIAL WORK PLACEMENT NOTE 03/10/2012  Patient:  HENCHY, ADAY  Account Number:  [id number] Admit date:  03/08/2012  Clinical Social Worker:  Butch Penny Josephanthony Tindel, BSW  Date/time:  03/09/2012 04:30 PM  Clinical Social Work is seeking post-discharge placement for this patient at the following level of care:   SKILLED NURSING   (*CSW will update this form in Epic as items are completed)   03/09/2012  Patient/family provided with [place] Department of Clinical Social Work's list of facilities offering this level of care within the geographic area requested by the patient (or if unable, by the patient's family).  03/09/2012  Patient/family informed of their freedom to choose among providers that offer the needed level of care, that participate in Medicare, Medicaid or managed care program needed by the patient, have an available bed and are willing to accept the patient.  03/09/2012  Patient/family informed of [facility]' ownership interest in [facility], as well as of the fact that they are under no obligation to receive care at this facility.  [facility] submitted to EDS on 03/10/2012 [facility] number received from EDS on 03/11/2012  FL2 transmitted to all facilities in geographic area requested by pt/family on  03/10/2012 FL2 transmitted to all facilities within larger geographic area on  NA Patient informed that his/her managed care company has contracts with or will negotiate with  certain facilities, including the following:   NA     Patient/family informed of bed offers received:  03/10/2012 Patient chooses bed at [facility] [place] Physician recommends and patient chooses bed at    Patient to be transferred to [facility] on  03/11/2012 Patient to be transferred to facility by [facility]-- daughter decided not to transport via car The following physician request were entered in  Epic:   Additional Comments: Patient was pleased with d/c plan and arrangements. She stated that she has notified her family. SNF and nursing notified of d/c.   Lorie Phenix. [place], [place]

## 2012-03-10 NOTE — Progress Notes (Signed)
Physical Therapy Treatment Note   03/10/12 1042  PT Visit Information  Last PT Received On 03/10/12  Assistance Needed +1  PT Time Calculation  PT Start Time 1042  PT Stop Time 1103  PT Time Calculation (min) 21 min  Subjective Data  Subjective Pt received sitting EOB with c/o nausea. Pt with 6/10 R knee pain.  Precautions  Precautions Knee  Required Braces or Orthoses Knee Immobilizer - Right  Knee Immobilizer - Right On when out of bed or walking  Restrictions  RLE Weight Bearing PWB  RLE Partial Weight Bearing Percentage or Pounds 50%  Cognition  Overall Cognitive Status Appears within functional limits for tasks assessed/performed  Arousal/Alertness Awake/alert  Orientation Level Appears intact for tasks assessed  Behavior During Session [facility] for tasks performed  Bed Mobility  Bed Mobility Not assessed  Transfers  Transfers Sit to Stand;Stand to Sit  Sit to Stand 4: Min assist;With upper extremity assist;From bed;From chair/3-in-1  Stand to Sit 4: Min assist;With upper extremity assist;With armrests;To bed;To chair/3-in-1  Details for Transfer Assistance pt with good technique  Ambulation/Gait  Ambulation/Gait Assistance 4: Min guard  Ambulation Distance (Feet) 60 Feet  Assistive device Rolling walker  Ambulation/Gait Assistance Details pt 100% compliant with R LE PWB  Gait Pattern Step-to pattern;Decreased step length - right;Decreased stance time - right;Antalgic  Gait velocity improved from yesterday  Stairs No  Exercises  Exercises Total Joint  Total Joint Exercises  Ankle Circles/Pumps AROM;Both;Supine;10 reps  Quad Sets AROM;Right;10 reps;Seated (with LEs elevated)  Heel Slides AAROM;Right;10 reps;Seated (with LEs elevated)  PT - End of Session  Equipment Utilized During Treatment Gait belt  Activity Tolerance Patient limited by fatigue  Patient left in chair;with call bell/phone within reach  PT - Assessment/Plan  Comments on Treatment Session Pt  progressing well towards all goals. Patient limited by fatigue and nausea this date.  PT Plan Discharge plan remains appropriate;Frequency remains appropriate  PT Frequency Min 6X/week  Follow Up Recommendations Skilled nursing facility  Equipment Recommended Defer to next venue  Acute Rehab PT Goals  PT Goal Formulation With patient  Time For Goal Achievement 03/23/12  Potential to Achieve Goals Good  PT Goal: Sit to Stand - Progress Progressing toward goal  PT Goal: Ambulate - Progress Progressing toward goal  PT Goal: Perform Home Exercise Program - Progress Progressing toward goal  PT Treatments  $Gait Training 8-22 mins    Pain: 6/10 R knee pain  Kittie Plater, PT, DPT Pager #: [phone] Office #: [phone]

## 2012-03-11 DIAGNOSIS — J309 Allergic rhinitis, unspecified: Secondary | ICD-10-CM | POA: Diagnosis not present

## 2012-03-11 DIAGNOSIS — R609 Edema, unspecified: Secondary | ICD-10-CM | POA: Diagnosis not present

## 2012-03-11 DIAGNOSIS — C189 Malignant neoplasm of colon, unspecified: Secondary | ICD-10-CM | POA: Diagnosis not present

## 2012-03-11 DIAGNOSIS — Z5189 Encounter for other specified aftercare: Secondary | ICD-10-CM | POA: Diagnosis not present

## 2012-03-11 DIAGNOSIS — M545 Low back pain: Secondary | ICD-10-CM | POA: Diagnosis not present

## 2012-03-11 DIAGNOSIS — N644 Mastodynia: Secondary | ICD-10-CM | POA: Diagnosis not present

## 2012-03-11 DIAGNOSIS — Z96659 Presence of unspecified artificial knee joint: Secondary | ICD-10-CM | POA: Diagnosis not present

## 2012-03-11 DIAGNOSIS — J019 Acute sinusitis, unspecified: Secondary | ICD-10-CM | POA: Diagnosis not present

## 2012-03-11 DIAGNOSIS — E119 Type 2 diabetes mellitus without complications: Secondary | ICD-10-CM | POA: Diagnosis not present

## 2012-03-11 DIAGNOSIS — T8450XA Infection and inflammatory reaction due to unspecified internal joint prosthesis, initial encounter: Secondary | ICD-10-CM | POA: Diagnosis not present

## 2012-03-11 DIAGNOSIS — M1990 Unspecified osteoarthritis, unspecified site: Secondary | ICD-10-CM | POA: Diagnosis not present

## 2012-03-11 DIAGNOSIS — I2510 Atherosclerotic heart disease of native coronary artery without angina pectoris: Secondary | ICD-10-CM | POA: Diagnosis not present

## 2012-03-11 DIAGNOSIS — E785 Hyperlipidemia, unspecified: Secondary | ICD-10-CM | POA: Diagnosis not present

## 2012-03-11 DIAGNOSIS — K921 Melena: Secondary | ICD-10-CM | POA: Diagnosis not present

## 2012-03-11 DIAGNOSIS — S99919A Unspecified injury of unspecified ankle, initial encounter: Secondary | ICD-10-CM | POA: Diagnosis not present

## 2012-03-11 DIAGNOSIS — I10 Essential (primary) hypertension: Secondary | ICD-10-CM | POA: Diagnosis not present

## 2012-03-11 DIAGNOSIS — M25569 Pain in unspecified knee: Secondary | ICD-10-CM | POA: Diagnosis not present

## 2012-03-11 DIAGNOSIS — D649 Anemia, unspecified: Secondary | ICD-10-CM | POA: Diagnosis not present

## 2012-03-11 LAB — CBC
Hemoglobin: 10.3 g/dL — ABNORMAL LOW (ref 12.0–15.0)
Hemoglobin: 9.7 g/dL — ABNORMAL LOW (ref 12.0–15.0)
MCH: 27.4 pg (ref 26.0–34.0)
MCH: 28 pg (ref 26.0–34.0)
MCHC: 33.8 g/dL (ref 30.0–36.0)
Platelets: 182 10*3/uL (ref 150–400)
Platelets: 196 10*3/uL (ref 150–400)
RBC: 3.54 MIL/uL — ABNORMAL LOW (ref 3.87–5.11)
RBC: 3.68 MIL/uL — ABNORMAL LOW (ref 3.87–5.11)
WBC: 11.5 10*3/uL — ABNORMAL HIGH (ref 4.0–10.5)

## 2012-03-11 LAB — TYPE AND SCREEN
Antibody Screen: NEGATIVE
Unit division: 0

## 2012-03-11 LAB — TISSUE CULTURE
Culture: NO GROWTH
Culture: NO GROWTH
Culture: NO GROWTH

## 2012-03-11 LAB — BASIC METABOLIC PANEL
CO2: 22 mEq/L (ref 19–32)
Calcium: 8.7 mg/dL (ref 8.4–10.5)
Glucose, Bld: 145 mg/dL — ABNORMAL HIGH (ref 70–99)
Potassium: 3.8 mEq/L (ref 3.5–5.1)
Sodium: 137 mEq/L (ref 135–145)

## 2012-03-11 LAB — BODY FLUID CULTURE

## 2012-03-11 LAB — GLUCOSE, CAPILLARY
Glucose-Capillary: 142 mg/dL — ABNORMAL HIGH (ref 70–99)
Glucose-Capillary: 156 mg/dL — ABNORMAL HIGH (ref 70–99)

## 2012-03-11 MED ORDER — SODIUM CHLORIDE 0.9 % IJ SOLN: 10.0000 mL | INTRAMUSCULAR | Status: DC | PRN

## 2012-03-11 MED ORDER — DEXTROSE 5 % IV SOLN: 2.0000 g | INTRAVENOUS | Status: DC

## 2012-03-11 MED ORDER — ACETAMINOPHEN 325 MG PO TABS: 650.0000 mg | ORAL_TABLET | Freq: Four times a day (QID) | ORAL | Status: DC | PRN

## 2012-03-11 MED ORDER — VANCOMYCIN HCL 1000 MG IV SOLR: 750.0000 mg | Freq: Two times a day (BID) | INTRAVENOUS | Status: DC

## 2012-03-11 NOTE — Progress Notes (Signed)
Physical Therapy Treatment Patient Details Name: Tonya [name] MRN: [id number] DOB: 16-Jun-1941 Today's Date: 03/11/2012 Time: [id number] PT Time Calculation (min): 17 min  PT Assessment / Plan / Recommendation Comments on Treatment Session  Pt continues to make good, steady progress, but still needs some safety cues for PWB.     Follow Up Recommendations  Skilled nursing facility    Barriers to Discharge        Equipment Recommendations  Defer to next venue    Recommendations for Other Services    Frequency Min 6X/week   Plan Discharge plan remains appropriate;Frequency remains appropriate    Precautions / Restrictions Precautions Precautions: Knee Precaution Comments: gentle R knee ROM per Tonya Edelman, PA due to spacer placement Required Braces or Orthoses: Knee Immobilizer - Right Knee Immobilizer - Right: On when out of bed or walking Restrictions Weight Bearing Restrictions: Yes RLE Weight Bearing: Partial weight bearing RLE Partial Weight Bearing Percentage or Pounds: 50   Pertinent Vitals/Pain No complaints at this time     Mobility  Bed Mobility Supine to Sit: 4: Min assist;With rails;HOB elevated (HOB 30 deg) Details for Bed Mobility Assistance: Min A for RLE management Transfers Transfers: Sit to Stand;Stand to Sit Sit to Stand: 4: Min guard;With upper extremity assist;From bed Stand to Sit: 4: Min guard;With upper extremity assist;With armrests;To chair/3-in-1 Details for Transfer Assistance: Pt with good technique, except not fully controlled descent Ambulation/Gait Ambulation/Gait Assistance: 4: Min guard Ambulation Distance (Feet): 70 Feet Assistive device: Rolling walker Ambulation/Gait Assistance Details: Cues needed for decreasing R step length and equalize steps as able Gait Pattern: Step-to pattern;Decreased step length - right;Decreased stance time - right;Antalgic General Gait Details: v/c's to maintain L LE PWB Stairs: No Wheelchair  Mobility Wheelchair Mobility: No         PT Diagnosis:    PT Problem List:   PT Treatment Interventions:     PT Goals Acute Rehab PT Goals PT Goal: Supine/Side to Sit - Progress: Progressing toward goal PT Goal: Sit to Stand - Progress: Progressing toward goal PT Goal: Ambulate - Progress: Progressing toward goal  Visit Information  Last PT Received On: 03/11/12 Assistance Needed: +1    Subjective Data  Subjective: Pt feeling better today, unless she moves R knee   Cognition  Overall Cognitive Status: Appears within functional limits for tasks assessed/performed Arousal/Alertness: Awake/alert Orientation Level: Appears intact for tasks assessed Behavior During Session: [facility] for tasks performed        End of Session PT - End of Session Equipment Utilized During Treatment: Gait belt;Right knee immobilizer Activity Tolerance: Patient tolerated treatment well Patient left: in chair;with call bell/phone within reach Nurse Communication: Mobility status    Tonya [name], [place]  03/11/2012, 11:51 AM

## 2012-03-11 NOTE — Progress Notes (Signed)
Patient ID: Tonya [name], female   DOB: [birth year], 71 y.o.   MRN: [id number] PATIENT ID: Tonya [name]        MRN:  [id number]          DOB/AGE: Jul 26, 1941 / 71 y.o.  [name], MD   Biagio Borg, PA-C [address], [place], [place]  [address]                             [phone]   PROGRESS NOTE 2 Subjective:  negative for Chest Pain  negative for Shortness of Breath  positive for Nausea/Vomiting-nauseated  negative for Calf Pain  negative for Bowel Movement   Tolerating Diet: no         Patient reports pain as mild.       Objective: Vital signs in last 24 hours:   Patient Vitals for the past 24 hrs:  BP Temp Temp src Pulse Resp SpO2  03/11/12 0600 160/72 mmHg 99.6 F (37.6 C) Oral 79  12  95 %  03/10/12 2300 126/72 mmHg 99.9 F (37.7 C) Oral 72  18  -  03/10/12 2230 148/56 mmHg 99.3 F (37.4 C) Oral 69  18  -  03/10/12 2133 140/61 mmHg 100.1 F (37.8 C) Oral 73  18  -  03/10/12 2030 157/69 mmHg 100.1 F (37.8 C) Oral 71  16  -  03/10/12 1931 150/84 mmHg 100.3 F (37.9 C) - 68  16  -  03/10/12 1830 157/70 mmHg 100.3 F (37.9 C) - 69  16  -  03/10/12 1715 156/68 mmHg 98.8 F (37.1 C) - 64  18  -  03/10/12 1615 144/65 mmHg 100.7 F (38.2 C) - 64  16  -  03/10/12 1500 130/65 mmHg 100.4 F (38 C) - 63  16  -  03/10/12 1437 127/68 mmHg 99.9 F (37.7 C) - 65  16  90 %  03/10/12 1013 118/48 mmHg - - - - -      Intake/Output from previous day:       Intake/Output this shift:       Intake/Output      06/13 0701 - 06/14 0700 06/14 0701 - 06/15 0700   P.O.     Total Intake(mL/kg)     Drains     Total Output     Net          Urine Occurrence 2 x       LABORATORY DATA:  Basename 03/11/12 0620 03/11/12 0023 03/10/12 0555 03/09/12 0500  WBC 11.5* 11.8* 11.2* 10.4  HGB 10.3* 9.7* 7.9* 8.8*  HCT 29.9* 28.7* 24.5* 27.4*  PLT 196 182 194 210    Basename 03/10/12 0555 03/09/12 0500  NA 134* 136  K 3.9 3.5  CL 102 101  CO2 22 24  BUN  12 14  CREATININE 1.02 1.09  GLUCOSE 163* 150*  CALCIUM 8.3* 8.2*   Lab Results  Component Value Date   INR 1.04 03/02/2012   INR 1.78* 05/31/2010   INR 2.21* 05/30/2010    Examination:  General appearance: alert and no distress  Wound Exam: clean, dry, intact   Drainage:  None: wound tissue dry  Motor Exam: EHL, FHL, Anterior Tibial and Posterior Tibial Intact  Sensory Exam: Superficial Peroneal, Deep Peroneal and Tibial normal  Vascular Exam: Normal  Assessment:    3 Days Post-Op  Procedure(s) (LRB): TOTAL KNEE REVISION (Right)  ADDITIONAL DIAGNOSIS:  Principal Problem:  *Infected prosthetic knee joint  Acute Blood Loss Anemia-improved with transfusion of two units   Plan: Physical Therapy as ordered Partial Weight Bearing @ 50% (PWB)  DVT Prophylaxis:  Xarelto  DISCHARGE PLAN: Skilled Nursing Facility/Rehab  DISCHARGE NEEDS: has own equipment   nausea needs to resolve prior to D/C-try zofran, will need PICC line      [name] W 03/11/2012, 7:43 AM

## 2012-03-11 NOTE — Progress Notes (Signed)
Peripherally Inserted Central Catheter/Midline Placement  The IV Nurse has discussed with the patient and/or persons authorized to consent for the patient, the purpose of this procedure and the potential benefits and risks involved with this procedure.  The benefits include less needle sticks, lab draws from the catheter and patient may be discharged home with the catheter.  Risks include, but not limited to, infection, bleeding, blood clot (thrombus formation), and puncture of an artery; nerve damage and irregular heat beat.  Alternatives to this procedure were also discussed.  PICC/Midline Placement Documentation        Henderson Baltimore 03/11/2012, 9:41 AM Explained by Claretha Cooper, RN  IV Team

## 2012-03-13 DIAGNOSIS — T8450XA Infection and inflammatory reaction due to unspecified internal joint prosthesis, initial encounter: Secondary | ICD-10-CM | POA: Diagnosis not present

## 2012-03-13 DIAGNOSIS — E785 Hyperlipidemia, unspecified: Secondary | ICD-10-CM | POA: Diagnosis not present

## 2012-03-13 DIAGNOSIS — E119 Type 2 diabetes mellitus without complications: Secondary | ICD-10-CM | POA: Diagnosis not present

## 2012-03-13 DIAGNOSIS — Z96659 Presence of unspecified artificial knee joint: Secondary | ICD-10-CM | POA: Diagnosis not present

## 2012-03-13 LAB — ANAEROBIC CULTURE

## 2012-03-15 LAB — TISSUE CULTURE

## 2012-03-15 LAB — CULTURE, BLOOD (ROUTINE X 2)
Culture: NO GROWTH
Culture: NO GROWTH

## 2012-03-17 NOTE — Telephone Encounter (Signed)
Can this encounter be closed?

## 2012-04-04 LAB — FUNGUS CULTURE W SMEAR
Fungal Smear: NONE SEEN
Fungal Smear: NONE SEEN

## 2012-04-14 ENCOUNTER — Ambulatory Visit (INDEPENDENT_AMBULATORY_CARE_PROVIDER_SITE_OTHER): Payer: Medicare Other | Admitting: Internal Medicine

## 2012-04-14 ENCOUNTER — Encounter: Payer: Self-pay | Admitting: Internal Medicine

## 2012-04-14 VITALS — BP 152/72 | HR 76 | Temp 98.2°F | Wt 172.0 lb

## 2012-04-14 DIAGNOSIS — T8450XA Infection and inflammatory reaction due to unspecified internal joint prosthesis, initial encounter: Secondary | ICD-10-CM | POA: Diagnosis not present

## 2012-04-14 LAB — CBC WITH DIFFERENTIAL/PLATELET
Eosinophils Relative: 0 % (ref 0–5)
Hemoglobin: 10.8 g/dL — ABNORMAL LOW (ref 12.0–15.0)
Lymphocytes Relative: 10 % — ABNORMAL LOW (ref 12–46)
Lymphs Abs: 0.8 10*3/uL (ref 0.7–4.0)
MCH: 26.8 pg (ref 26.0–34.0)
MCV: 82.1 fL (ref 78.0–100.0)
Monocytes Relative: 4 % (ref 3–12)
Platelets: 169 10*3/uL (ref 150–400)
RBC: 4.03 MIL/uL (ref 3.87–5.11)
WBC: 8.4 10*3/uL (ref 4.0–10.5)

## 2012-04-14 LAB — BASIC METABOLIC PANEL WITH GFR
CO2: 24 mEq/L (ref 19–32)
Chloride: 101 mEq/L (ref 96–112)
Creat: 0.93 mg/dL (ref 0.50–1.10)
Glucose, Bld: 164 mg/dL — ABNORMAL HIGH (ref 70–99)
Sodium: 136 mEq/L (ref 135–145)

## 2012-04-14 LAB — SEDIMENTATION RATE: Sed Rate: 6 mm/hr (ref 0–22)

## 2012-04-14 LAB — C-REACTIVE PROTEIN: CRP: <0.5 mg/dL (ref <0.60)

## 2012-04-15 ENCOUNTER — Telehealth: Payer: Self-pay | Admitting: *Deleted

## 2012-04-15 NOTE — Progress Notes (Signed)
INFECTIOUS DISEASES CLINIC   RFV: hospital follow up for prosthetic joint infection, 2 staged revision Subjective:    Patient ID: Tonya [name], female    DOB: 11/30/1940, 71 y.o.   MRN: [id number]  HPI Tonya [name] is a very pleasant 71 year old white female who has been seen with a painful right total knee arthroplasty. This was performed in August of 2011 and she started having problems with it fairly quickly. She had a very large hematoma which had to be evacuated after her surgery. She had done well until March 30, 2011. At that time she had some pain and discomfort. At that time she states that whenever she was on her feet for any particular length of time she had some discomfort but overall though there was no sign of any infection or problems. She returned back on the 18th of March 2013 and she started having about a week to week and a half pain in her knee. There was no real history of injury or trauma. No fever, shakes or chills. She was simply out shopping with a friend and she started experiencing some knee pain. It was more of a constant moderately severe pain with a sharp quality. She did have some problems sleeping. The knee was aspirated at that time of some cloudy orange brown fluid and this was sent for evaluation. Her studies came back with no organisms on the Gram stain and no growth for 3 days. At that time she had an 11,400 white count with sedimentation rate of 55 and C-reactive protein 1.54. Cell count was 17,065 white cells, 90% neutrophils and 10% monocytes or macrophages. No crystals were noted. She continues to have symptoms. A bone scan and was obtained. This revealed abnormal uptake on all 3 phases of the bone scan within the right knee involving the femoral condyles and tibial plateau consistent with joint infection or possible prosthetic loosening. She was admitted on 03/08/2012 for exploration of right total knee replacement with synovectomy and removal of total knee replacement  components and Insertion of antibiotic spacers. OR cultures identified CoNS, she was discharged on ceftriaxone and vancomycin for 6 wks. She now presents to clinic on 5th week of IV antibiotics (day #40) doing well. No fever,chills, nightsweats, no tenderness to knee.  Current Outpatient Prescriptions on File Prior to Visit  Medication Sig Dispense Refill  . acetaminophen (TYLENOL) 325 MG tablet Take 2 tablets (650 mg total) by mouth every 6 (six) hours as needed for fever (temp greater than 100.0).      Marland Kitchen atorvastatin (LIPITOR) 20 MG tablet Take 20 mg by mouth daily.      . calcium-vitamin D (OSCAL WITH D 500-200) 500-200 MG-UNIT per tablet Take 1 tablet by mouth 2 (two) times daily.        . carvedilol (COREG) 12.5 MG tablet Take 1 tablet (12.5 mg total) by mouth 2 (two) times daily with a meal.  180 tablet  3  . cholestyramine light (PREVALITE) 4 G packet Take 4 g by mouth 2 (two) times daily.      . citalopram (CELEXA) 40 MG tablet Take 1 tablet (40 mg total) by mouth daily.  90 tablet  3  . dextrose 5 % SOLN 50 mL with cefTRIAXone 2 G SOLR 2 g Inject 2 g into the vein daily.  42 Units  0  . diltiazem (CARDIZEM CD) 240 MG 24 hr capsule Take 1 capsule (240 mg total) by mouth daily.  90 capsule  3  . ferrous  sulfate 325 (65 FE) MG EC tablet Take 325 mg by mouth 2 (two) times daily.      Marland Kitchen glyBURIDE-metformin (GLUCOVANCE) 1.25-250 MG per tablet Take 1 tablet by mouth 2 (two) times daily with a meal.  180 tablet  3  . hydrochlorothiazide (HYDRODIURIL) 25 MG tablet Take 1 tablet (25 mg total) by mouth daily.  90 tablet  3  . omeprazole (PRILOSEC) 20 MG capsule Take 20 mg by mouth daily.      . potassium chloride SA (K-DUR,KLOR-CON) 20 MEQ tablet Take 1 tablet (20 mEq total) by mouth 2 (two) times daily.  180 tablet  3  . PREVALITE 4 G packet TAKE 1 PACKET (4 G TOTAL) BY MOUTH 2 (TWO) TIMES DAILY WITH A MEAL.  60 packet  1  . rivaroxaban (XARELTO) 10 MG TABS tablet Take 1 tablet (10 mg total) by  mouth daily.  20 tablet  0  . sodium chloride 0.9 % SOLN 150 mL with vancomycin 1000 MG SOLR 750 mg Inject 750 mg into the vein every 12 (twelve) hours.  84 Units  0   Active Ambulatory Problems    Diagnosis Date Noted  . ADENOCARCINOMA, COLON, CECUM 10/05/2008  . DIABETES MELLITUS, TYPE II 03/21/2007  . HYPERLIPIDEMIA 09/19/2007  . ANXIETY 09/19/2007  . DEPRESSION 09/19/2007  . SYNDROME, CARPAL TUNNEL 03/21/2007  . HYPERTENSION 03/21/2007  . CORONARY ARTERY DISEASE 11/23/2007  . ALLERGIC RHINITIS 01/23/2009  . GERD 09/19/2007  . IBS 03/21/2007  . Osteoarth NOS-Unspec 03/21/2007  . ATRIAL FIBRILLATION, PAROXYSMAL 10/17/2010  . Preoperative clearance 01/29/2012  . Infected prosthetic knee joint 03/08/2012   Resolved Ambulatory Problems    Diagnosis Date Noted  . No Resolved Ambulatory Problems   Past Medical History  Diagnosis Date  . Osteoarthritis   . Edema, peripheral   . CAD (coronary artery disease)   . Breast pain   . Hyperlipidemia   . Hypertension   . Sinusitis   . Allergic rhinitis   . Blood in stool   . Adenocarcinoma, colon   . Anemia   . Sciatica   . Cystitis, acute   . Chronic diarrhea   . Nausea   . GERD (gastroesophageal reflux disease)   . Depression   . Anxiety   . Osteoarthritis   . IBS (irritable bowel syndrome)   . Degenerative disk disease   . Diabetes mellitus, type 2   . Family history of diabetes mellitus    History  Substance Use Topics  . Smoking status: Never Smoker   . Smokeless tobacco: Never Used  . Alcohol Use: No  family history includes Heart disease in her father and mother.  Review of Systems  Constitutional: Negative for fever, chills, diaphoresis, activity change, appetite change, fatigue and unexpected weight change.  HENT: Negative for congestion, sore throat, rhinorrhea, sneezing, trouble swallowing and sinus pressure.  Eyes: Negative for photophobia and visual disturbance.  Respiratory: Negative for cough, chest  tightness, shortness of breath, wheezing and stridor.  Cardiovascular: Negative for chest pain, palpitations and leg swelling.  Gastrointestinal: Negative for nausea, vomiting, abdominal pain, diarrhea, constipation, blood in stool, abdominal distention and anal bleeding.  Genitourinary: Negative for dysuria, hematuria, flank pain and difficulty urinating.  Musculoskeletal: per hpi Skin: Negative for color change, pallor, rash and wound.  Neurological: Negative for dizziness, tremors, weakness and light-headedness.  Hematological: Negative for adenopathy. Does not bruise/bleed easily.  Psychiatric/Behavioral: Negative for behavioral problems, confusion, sleep disturbance, dysphoric mood, decreased concentration and agitation.  Objective:   Physical Exam BP 152/72  Pulse 76  Temp 98.2 F (36.8 C) (Oral)  Wt 172 lb (78.019 kg)  Physical Exam  Constitutional:  oriented to person, place, and time.  appears well-developed and well-nourished. No distress.  HENT: [place]/AT, PERRLA, EOMI, no scleral icterus Mouth/Throat: Oropharynx is clear and moist. No oropharyngeal exudate.  Cardiovascular: Normal rate, regular rhythm and normal heart sounds. Exam reveals no gallop and no friction rub. No murmur heard.  Pulmonary/Chest: Effort normal and breath sounds normal. No respiratory distress. no wheezes.  Lymphadenopathy:  no cervical or axillary adenopathy.   ext= right knee slightly warm to touch than unaffected knee. No erythema. Still mild swelling Skin: Skin is warm and dry. No rash noted. No erythema.   Labs: Lab Results  Component Value Date   ESRSEDRATE 6 04/14/2012   Lab Results  Component Value Date   CRP <0.5 04/14/2012   Historic Micro: 6/11 coagulase negative staph aureus ( vanco 1 doxy 4)    Assessment & Plan:  Prosthetic joint infection s/p 2 staged revision, currently has antibiotic spacer in place = currently at 5 of 6 wks of IV vancomycin and ceftriaxone. Needs 1 more  week to finish antibiotics to finish on 7/25. Inflammatory markers are normal.   Recommend to have repeat arthrocentesis a few days (during week of July  29th) after antibiotic completion to send for cell count and culture. If negative, then can proceed with next stage of revision with new implant.  Will communicate with orthopedics surgery for next steps.

## 2012-04-15 NOTE — Telephone Encounter (Signed)
Nurse, Herby Abraham called from pts nursing home, [place].  She was told to call regarding lab results, and would be notified of how long to continue vanc troughs. Phone # [phone], also can be faxed to [phone]. Myrtis Hopping CMA

## 2012-04-18 ENCOUNTER — Telehealth: Payer: Self-pay | Admitting: Licensed Clinical Social Worker

## 2012-04-18 NOTE — Telephone Encounter (Addendum)
Wes, RN called wanting to know if the stop date for the patient's Vanc and ceftriaxone still 04/19/2012 and if they are to continue weekly vanc troughs. Please advise   04/18/2012 at 1:24pm, spoke with NH, and mentioned to keep abtx until end of the week when she sees orthopedics surgery who will determine to stop abtx or keep going with IV. She is to undergo next step of her surgery

## 2012-04-20 LAB — AFB CULTURE WITH SMEAR (NOT AT ARMC)

## 2012-04-20 NOTE — Telephone Encounter (Signed)
Can this encounter be closed?

## 2012-04-26 ENCOUNTER — Telehealth: Payer: Self-pay | Admitting: *Deleted

## 2012-04-26 NOTE — Telephone Encounter (Signed)
Spoke with Social Worker about patient and was advised that the patient had last dose of Vanc 04/22/12 and she has been discharged and was sent home 04/25/12. Advised just checking to verify they had everything they needed from [place]. She advised yes and we ended the call.

## 2012-04-29 DIAGNOSIS — T847XXA Infection and inflammatory reaction due to other internal orthopedic prosthetic devices, implants and grafts, initial encounter: Secondary | ICD-10-CM | POA: Diagnosis not present

## 2012-05-03 ENCOUNTER — Encounter (HOSPITAL_COMMUNITY): Payer: Self-pay | Admitting: Pharmacy Technician

## 2012-05-03 ENCOUNTER — Encounter: Payer: Self-pay | Admitting: Internal Medicine

## 2012-05-03 ENCOUNTER — Ambulatory Visit (INDEPENDENT_AMBULATORY_CARE_PROVIDER_SITE_OTHER): Payer: Medicare Other | Admitting: Internal Medicine

## 2012-05-03 VITALS — BP 165/75 | HR 65 | Temp 98.1°F | Wt 167.0 lb

## 2012-05-03 DIAGNOSIS — T8450XA Infection and inflammatory reaction due to unspecified internal joint prosthesis, initial encounter: Secondary | ICD-10-CM

## 2012-05-03 MED ORDER — DOXYCYCLINE HYCLATE 100 MG PO TABS: 100.0000 mg | ORAL_TABLET | Freq: Two times a day (BID) | ORAL | Status: DC

## 2012-05-03 NOTE — Progress Notes (Signed)
INFECTIOUS DISEASES CLINIC  RFV: prosthetic joint infection, undergoing 2 staged revision Subjective:    Patient ID: Tonya [name], female    DOB: Apr 22, 1941, 71 y.o.   MRN: [id number]  HPI Tonya [name] is a very pleasant 71 year old white female who has been seen with a painful right total knee arthroplasty. This was performed in August of 2011 and she started having problems with it fairly quickly. She had a very large hematoma which had to be evacuated after her surgery. She had done well until March 30, 2011. At that time she had some pain and discomfort. At that time she states that whenever she was on her feet for any particular length of time she had some discomfort but overall though there was no sign of any infection or problems. She returned back on the 18th of March 2013 and she started having about a week to week and a half pain in her knee. There was no real history of injury or trauma. No fever, shakes or chills. She was simply out shopping with a friend and she started experiencing some knee pain. It was more of a constant moderately severe pain with a sharp quality. She did have some problems sleeping. The knee was aspirated at that time of some cloudy orange brown fluid and this was sent for evaluation. Her studies came back with no organisms on the Gram stain and no growth for 3 days. At that time she had an 11,400 white count with sedimentation rate of 55 and C-reactive protein 1.54. Cell count was 17,065 white cells, 90% neutrophils and 10% monocytes or macrophages. No crystals were noted. She continues to have symptoms. A bone scan and was obtained. This revealed abnormal uptake on all 3 phases of the bone scan within the right knee involving the femoral condyles and tibial plateau consistent with joint infection or possible prosthetic loosening. She was admitted on 03/08/2012 for exploration of right total knee replacement with synovectomy and removal of total knee replacement components and  Insertion of antibiotic spacers. OR cultures identified CoNS, she was discharged on ceftriaxone and vancomycin for 6 wks. Which finished on 7/25. She reports having undergone a repeat arthrocentesis that documented clearance her surgery set for 8/20. Current Outpatient Prescriptions on File Prior to Visit  Medication Sig Dispense Refill  . acetaminophen (TYLENOL) 325 MG tablet Take 2 tablets (650 mg total) by mouth every 6 (six) hours as needed for fever (temp greater than 100.0).      Marland Kitchen atorvastatin (LIPITOR) 20 MG tablet Take 20 mg by mouth daily.      . calcium-vitamin D (OSCAL WITH D 500-200) 500-200 MG-UNIT per tablet Take 1 tablet by mouth 2 (two) times daily.        . carvedilol (COREG) 12.5 MG tablet Take 1 tablet (12.5 mg total) by mouth 2 (two) times daily with a meal.  180 tablet  3  . cholestyramine light (PREVALITE) 4 G packet Take 4 g by mouth 2 (two) times daily.      . citalopram (CELEXA) 40 MG tablet Take 1 tablet (40 mg total) by mouth daily.  90 tablet  3  . dextrose 5 % SOLN 50 mL with cefTRIAXone 2 G SOLR 2 g Inject 2 g into the vein daily.  42 Units  0  . diltiazem (CARDIZEM CD) 240 MG 24 hr capsule Take 1 capsule (240 mg total) by mouth daily.  90 capsule  3  . ferrous sulfate 325 (65 FE) MG EC tablet Take  325 mg by mouth 2 (two) times daily.      Marland Kitchen glyBURIDE-metformin (GLUCOVANCE) 1.25-250 MG per tablet Take 1 tablet by mouth 2 (two) times daily with a meal.  180 tablet  3  . hydrochlorothiazide (HYDRODIURIL) 25 MG tablet Take 1 tablet (25 mg total) by mouth daily.  90 tablet  3  . omeprazole (PRILOSEC) 20 MG capsule Take 20 mg by mouth daily.      . potassium chloride SA (K-DUR,KLOR-CON) 20 MEQ tablet Take 1 tablet (20 mEq total) by mouth 2 (two) times daily.  180 tablet  3  . PREVALITE 4 G packet TAKE 1 PACKET (4 G TOTAL) BY MOUTH 2 (TWO) TIMES DAILY WITH A MEAL.  60 packet  1  . rivaroxaban (XARELTO) 10 MG TABS tablet Take 1 tablet (10 mg total) by mouth daily.  20 tablet   0  . sodium chloride 0.9 % SOLN 150 mL with vancomycin 1000 MG SOLR 750 mg Inject 750 mg into the vein every 12 (twelve) hours.  84 Units  0   Active Ambulatory Problems    Diagnosis Date Noted  . ADENOCARCINOMA, COLON, CECUM 10/05/2008  . DIABETES MELLITUS, TYPE II 03/21/2007  . HYPERLIPIDEMIA 09/19/2007  . ANXIETY 09/19/2007  . DEPRESSION 09/19/2007  . SYNDROME, CARPAL TUNNEL 03/21/2007  . HYPERTENSION 03/21/2007  . CORONARY ARTERY DISEASE 11/23/2007  . ALLERGIC RHINITIS 01/23/2009  . GERD 09/19/2007  . IBS 03/21/2007  . Osteoarth NOS-Unspec 03/21/2007  . ATRIAL FIBRILLATION, PAROXYSMAL 10/17/2010  . Preoperative clearance 01/29/2012  . Infected prosthetic knee joint 03/08/2012   Resolved Ambulatory Problems    Diagnosis Date Noted  . No Resolved Ambulatory Problems   Past Medical History  Diagnosis Date  . Osteoarthritis   . Edema, peripheral   . CAD (coronary artery disease)   . Breast pain   . Hyperlipidemia   . Hypertension   . Sinusitis   . Allergic rhinitis   . Blood in stool   . Adenocarcinoma, colon   . Anemia   . Sciatica   . Cystitis, acute   . Chronic diarrhea   . Nausea   . GERD (gastroesophageal reflux disease)   . Depression   . Anxiety   . Osteoarthritis   . IBS (irritable bowel syndrome)   . Degenerative disk disease   . Diabetes mellitus, type 2   . Family history of diabetes mellitus     Review of Systems Review of Systems  Constitutional: Negative for fever, chills, diaphoresis, activity change, appetite change, fatigue and unexpected weight change.  HENT: Negative for congestion, sore throat, rhinorrhea, sneezing, trouble swallowing and sinus pressure.  Eyes: Negative for photophobia and visual disturbance.  Respiratory: Negative for cough, chest tightness, shortness of breath, wheezing and stridor.  Cardiovascular: Negative for chest pain, palpitations and leg swelling.  Gastrointestinal: Negative for nausea, vomiting, abdominal pain,  diarrhea, constipation, blood in stool, abdominal distention and anal bleeding.  Genitourinary: Negative for dysuria, hematuria, flank pain and difficulty urinating.  Musculoskeletal: Negative for myalgias, back pain, joint swelling, arthralgias and gait problem.  Skin: Negative for color change, pallor, rash and wound.  Neurological: Negative for dizziness, tremors, weakness and light-headedness.  Hematological: Negative for adenopathy. Does not bruise/bleed easily.  Psychiatric/Behavioral: Negative for behavioral problems, confusion, sleep disturbance, dysphoric mood, decreased concentration and agitation.      Objective:   Physical Exam  BP 165/75  Pulse 65  Temp 98.1 F (36.7 C) (Oral)  Wt 167 lb (75.751 kg) Constitutional:  oriented  to person, place, and time. appears well-developed and well-nourished. No distress.  HENT: [place]/AT, [place], EOMI, no-scleral icterus Mouth/Throat: Oropharynx is clear and moist. No oropharyngeal exudate.  Cardiovascular: Normal rate, regular rhythm and normal heart sounds. Exam reveals no gallop and no friction rub. No murmur heard.  Pulmonary/Chest: Effort normal and breath sounds normal. No respiratory distress. He has no wheezes.  Lymphadenopathy:  no cervical adenopathy. Ext =  Right knee warm to touch Skin: Skin is warm and dry. No rash noted. No erythema. Closed incision, well-healed on right knee Psychiatric: pleasant     Assessment & Plan:   prosthetic joint infection undergoing a staged 2 revision, currently has antibiotic spacer in place = will place her on doxycycline 100mg  BID until the surgery since we don't have arthrocentesis cell count or culture results on hand. Will check ESR and CRP at this time.  specimen 04/29/12: Called for results: cell count wbc 1,775,  84 %neutrophils; gram stain :No wbc, no organism; Culture: no growth  Arthrocentesis synovial fluid is less than her specimen from OR. She still has some mild inflammation.  Reassuring that there is no growth on culture.   We will continue with doxycycline 100mg  BID to take until surgery, slated for 05/17/12.  Gave instructions for doxy - take on full stomach, avoid sun, use sunscreen  Asked patient to call if increasing redness to knee, fevers, pain to knee, worsening swelling to knee or diarrhea.  Cc: peter whitfield  Elzie Rings. [facility] [phone]

## 2012-05-04 LAB — SEDIMENTATION RATE: Sed Rate: 4 mm/hr (ref 0–22)

## 2012-05-11 ENCOUNTER — Encounter (HOSPITAL_COMMUNITY): Payer: Self-pay

## 2012-05-11 ENCOUNTER — Encounter (HOSPITAL_COMMUNITY)
Admission: RE | Admit: 2012-05-11 | Discharge: 2012-05-11 | Disposition: A | Discharge status: Home / Self Care | Payer: Medicare Other | Source: Ambulatory Visit | Attending: Orthopaedic Surgery | Admitting: Orthopaedic Surgery

## 2012-05-11 LAB — COMPREHENSIVE METABOLIC PANEL
Alkaline Phosphatase: 80 U/L (ref 39–117)
BUN: 18 mg/dL (ref 6–23)
Creatinine, Ser: 0.98 mg/dL (ref 0.50–1.10)
GFR calc Af Amer: 65 mL/min — ABNORMAL LOW (ref >90)
Glucose, Bld: 117 mg/dL — ABNORMAL HIGH (ref 70–99)
Potassium: 3.7 mEq/L (ref 3.5–5.1)
Total Bilirubin: 0.3 mg/dL (ref 0.3–1.2)
Total Protein: 7.1 g/dL (ref 6.0–8.3)

## 2012-05-11 LAB — URINALYSIS, ROUTINE W REFLEX MICROSCOPIC
Bilirubin Urine: NEGATIVE
Hgb urine dipstick: NEGATIVE
Protein, ur: NEGATIVE mg/dL
Urobilinogen, UA: 0.2 mg/dL (ref 0.0–1.0)

## 2012-05-11 LAB — CBC
Hemoglobin: 12.5 g/dL (ref 12.0–15.0)
RBC: 4.55 MIL/uL (ref 3.87–5.11)

## 2012-05-11 LAB — PROTIME-INR: Prothrombin Time: 12.3 seconds (ref 11.6–15.2)

## 2012-05-11 LAB — SURGICAL PCR SCREEN: MRSA, PCR: NEGATIVE

## 2012-05-11 NOTE — Pre-Procedure Instructions (Addendum)
20 CHEENA HOFMANN  05/11/2012   Your procedure is scheduled on:  August 20  Report to [facility] at 06:30 AM.  Call this number if you have problems the morning of surgery: [phone]   Remember:   Do not eat or drink:After Midnight.  Take these medicines the morning of surgery with A SIP OF WATER: Tylenol, Coreg, Diltiazem, Omeprazole, Oxycodone, Tramadol, Doxycycline, celexa   STOP Multiple Vitamins, Fish Oil, Relafen, Aspirin Today   Do not wear jewelry, make-up or nail polish.  Do not wear lotions, powders, or perfumes. You may wear deodorant.  Do not shave 48 hours prior to surgery. Men may shave face and neck.  Do not bring valuables to the hospital.  Contacts, dentures or bridgework may not be worn into surgery.  Leave suitcase in the car. After surgery it may be brought to your room.  For patients admitted to the hospital, checkout time is 11:00 AM the day of discharge.   Special Instructions: Incentive Spirometry - Practice and bring it with you on the day of surgery. and CHG Shower Use Special Wash: 1/2 bottle night before surgery and 1/2 bottle morning of surgery. Start nightly showers on August 17   Please read over the following fact sheets that you were given: Pain Booklet, Coughing and Deep Breathing, Blood Transfusion Information, Total Joint Packet and Surgical Site Infection Prevention

## 2012-05-11 NOTE — Progress Notes (Signed)
Spoke with Vaughan Basta in blood bank regarding patient history of recent blood transfusions. Per Vaughan Basta patient transfused on 03/10/12. Per Vaughan Basta draw blood bank sample today to allow them to determine antibody presence and type ahead of surgery and help avoid a potential surgery delay as a result of difficulty in cross matching patient. Then redraw sample DOS.

## 2012-05-12 LAB — URINE CULTURE

## 2012-05-12 NOTE — Consult Note (Signed)
Anesthesia Chart Review: Patient is a 71 year old female scheduled for right TK revision with removal of antibiotic spacer on 05/17/12 by Dr. Durward Fortes.  She is s/p exploration of right TKR with debridement, removal of protheses and insertion of antibiotic spacers on 03/08/12.   History includes non-smoker, CAD s/p LAD BMS, HLD, OA, anemia, depression, DM2, peripheral edema, HTN, colon CA s/p partial colectomy, GERD, anxiety, IBS, right TKA complicated by hemarthrosis 04/2010. PCP is Dr. [name].  She has also been evaluated by Dr. Carlyle Basques at the [facility].  Her Cardiologist is Dr. Percival Spanish (previously followed by Dr. Haroldine Laws). She was seen on 02/04/12 prior to her surgery in June 2013 for pre-operative evaluation. EKG then showed NSR, septal infarct (age undetermined). He recommended a stress test which was normal (see below), and he felt she was acceptable risk at that time.  She has not been evaluated by Cardiology since her stress test, but did not have any known CV complications with her last surgery.  Nuclear stress test on 02/09/12 showed:  Normal stress nuclear study. There is breast attenuation that likely accounts for the mild fixed anteroseptal defect with normal motion. LV Ejection Fraction: 71%. LV Wall Motion: NL LV Function; NL Wall Motion.   Echo on 11/01/08 showed:  Overall left ventricular systolic function was normal. Left ventricular ejection fraction was estimated , range being 60 % to 65 %. There were no left ventricular regional wall motion abnormalities. Trivial MR/TR/PR.   Cardiac cath on 09/17/09 showed patent LAD stent, otherwise non-obstructive CAD (40% left main stenosis, 40% diagonal stenosis, 40% circumflex stenosis), normal LV function, normal right-sided pressures.   CXR on 03/02/12 showed no evidence of acute cardiopulmonary disease. Stable mild cardiomegaly.   Labs acceptable.  Urine culture is still pending.  She is for a T&S on arrival.   If no new CV  symptoms then anticipate she can proceed as planned.  Myra Gianotti, PA-C
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
## 2012-05-16 MED ORDER — CHLORHEXIDINE GLUCONATE 4 % EX LIQD: 60.0000 mL | Freq: Every day | CUTANEOUS | Status: DC

## 2012-05-16 MED ORDER — CHLORHEXIDINE GLUCONATE 4 % EX LIQD: 60.0000 mL | Freq: Once | CUTANEOUS | Status: DC

## 2012-05-16 MED ORDER — ACETAMINOPHEN 10 MG/ML IV SOLN
1000.0000 mg | Freq: Once | INTRAVENOUS | Status: AC
  Administered 2012-05-17: 1000 mg via INTRAVENOUS
  Filled 2012-05-16: qty 100

## 2012-05-16 MED ORDER — SODIUM CHLORIDE 0.9 % IV SOLN: INTRAVENOUS | Status: DC

## 2012-05-17 ENCOUNTER — Encounter (HOSPITAL_COMMUNITY): Payer: Self-pay | Admitting: Vascular Surgery

## 2012-05-17 ENCOUNTER — Inpatient Hospital Stay (HOSPITAL_COMMUNITY)
Admission: RE | Admit: 2012-05-17 | Discharge: 2012-05-20 | DRG: 467 | Disposition: A | Discharge status: Home Health | Payer: Medicare Other | Source: Ambulatory Visit | Attending: Orthopaedic Surgery | Admitting: Orthopaedic Surgery

## 2012-05-17 ENCOUNTER — Encounter (HOSPITAL_COMMUNITY)
Admission: RE | Disposition: A | Discharge status: Home Health | Payer: Self-pay | Source: Ambulatory Visit | Attending: Orthopaedic Surgery

## 2012-05-17 ENCOUNTER — Inpatient Hospital Stay (HOSPITAL_COMMUNITY): Payer: Medicare Other | Admitting: Vascular Surgery

## 2012-05-17 DIAGNOSIS — E785 Hyperlipidemia, unspecified: Secondary | ICD-10-CM | POA: Diagnosis not present

## 2012-05-17 DIAGNOSIS — Z9861 Coronary angioplasty status: Secondary | ICD-10-CM | POA: Diagnosis not present

## 2012-05-17 DIAGNOSIS — G8918 Other acute postprocedural pain: Secondary | ICD-10-CM | POA: Diagnosis not present

## 2012-05-17 DIAGNOSIS — I10 Essential (primary) hypertension: Secondary | ICD-10-CM | POA: Diagnosis present

## 2012-05-17 DIAGNOSIS — K219 Gastro-esophageal reflux disease without esophagitis: Secondary | ICD-10-CM | POA: Diagnosis present

## 2012-05-17 DIAGNOSIS — F411 Generalized anxiety disorder: Secondary | ICD-10-CM | POA: Diagnosis present

## 2012-05-17 DIAGNOSIS — Z85038 Personal history of other malignant neoplasm of large intestine: Secondary | ICD-10-CM | POA: Diagnosis not present

## 2012-05-17 DIAGNOSIS — E876 Hypokalemia: Secondary | ICD-10-CM | POA: Diagnosis not present

## 2012-05-17 DIAGNOSIS — E1169 Type 2 diabetes mellitus with other specified complication: Secondary | ICD-10-CM | POA: Diagnosis present

## 2012-05-17 DIAGNOSIS — R509 Fever, unspecified: Secondary | ICD-10-CM | POA: Diagnosis not present

## 2012-05-17 DIAGNOSIS — T847XXA Infection and inflammatory reaction due to other internal orthopedic prosthetic devices, implants and grafts, initial encounter: Secondary | ICD-10-CM | POA: Diagnosis not present

## 2012-05-17 DIAGNOSIS — F331 Major depressive disorder, recurrent, moderate: Secondary | ICD-10-CM | POA: Diagnosis present

## 2012-05-17 DIAGNOSIS — I2510 Atherosclerotic heart disease of native coronary artery without angina pectoris: Secondary | ICD-10-CM | POA: Diagnosis present

## 2012-05-17 DIAGNOSIS — M2180 Other specified acquired deformities of unspecified limb: Principal | ICD-10-CM | POA: Diagnosis present

## 2012-05-17 DIAGNOSIS — M25569 Pain in unspecified knee: Secondary | ICD-10-CM | POA: Diagnosis not present

## 2012-05-17 DIAGNOSIS — F329 Major depressive disorder, single episode, unspecified: Secondary | ICD-10-CM | POA: Diagnosis present

## 2012-05-17 DIAGNOSIS — E119 Type 2 diabetes mellitus without complications: Secondary | ICD-10-CM | POA: Diagnosis present

## 2012-05-17 DIAGNOSIS — Z96659 Presence of unspecified artificial knee joint: Secondary | ICD-10-CM

## 2012-05-17 DIAGNOSIS — E1121 Type 2 diabetes mellitus with diabetic nephropathy: Secondary | ICD-10-CM | POA: Diagnosis present

## 2012-05-17 DIAGNOSIS — D62 Acute posthemorrhagic anemia: Secondary | ICD-10-CM | POA: Diagnosis not present

## 2012-05-17 DIAGNOSIS — T8489XA Other specified complication of internal orthopedic prosthetic devices, implants and grafts, initial encounter: Secondary | ICD-10-CM | POA: Diagnosis not present

## 2012-05-17 DIAGNOSIS — F3289 Other specified depressive episodes: Secondary | ICD-10-CM | POA: Diagnosis present

## 2012-05-17 DIAGNOSIS — T8450XA Infection and inflammatory reaction due to unspecified internal joint prosthesis, initial encounter: Secondary | ICD-10-CM | POA: Diagnosis not present

## 2012-05-17 HISTORY — PX: TOTAL KNEE REVISION: SHX996

## 2012-05-17 LAB — GLUCOSE, CAPILLARY: Glucose-Capillary: 158 mg/dL — ABNORMAL HIGH (ref 70–99)

## 2012-05-17 SURGERY — TOTAL KNEE REVISION
Anesthesia: General | Site: Knee | Laterality: Right | Wound class: Clean

## 2012-05-17 MED ORDER — ALUM & MAG HYDROXIDE-SIMETH 200-200-20 MG/5ML PO SUSP: 30.0000 mL | ORAL | Status: DC | PRN

## 2012-05-17 MED ORDER — ONDANSETRON HCL 4 MG/2ML IJ SOLN: 4.0000 mg | Freq: Once | INTRAMUSCULAR | Status: DC | PRN

## 2012-05-17 MED ORDER — VANCOMYCIN HCL 1000 MG IV SOLR
1000.0000 mg | INTRAVENOUS | Status: DC | PRN
  Administered 2012-05-17: 1000 mg via INTRAVENOUS

## 2012-05-17 MED ORDER — BISACODYL 10 MG RE SUPP: 10.0000 mg | Freq: Every day | RECTAL | Status: DC | PRN

## 2012-05-17 MED ORDER — VANCOMYCIN HCL 1000 MG IV SOLR
INTRAVENOUS | Status: DC | PRN
  Administered 2012-05-17: 1000 mg

## 2012-05-17 MED ORDER — VANCOMYCIN HCL IN DEXTROSE 1-5 GM/200ML-% IV SOLN
INTRAVENOUS | Status: AC
  Filled 2012-05-17: qty 2200

## 2012-05-17 MED ORDER — SUFENTANIL CITRATE 50 MCG/ML IV SOLN
INTRAVENOUS | Status: DC | PRN
  Administered 2012-05-17: 15 ug via INTRAVENOUS
  Administered 2012-05-17 (×3): 5 ug via INTRAVENOUS

## 2012-05-17 MED ORDER — PHENOL 1.4 % MT LIQD: 1.0000 | OROMUCOSAL | Status: DC | PRN

## 2012-05-17 MED ORDER — ACETAMINOPHEN 10 MG/ML IV SOLN
INTRAVENOUS | Status: AC
Start: 2012-05-17 — End: 2012-05-17
  Filled 2012-05-17: qty 100

## 2012-05-17 MED ORDER — ONDANSETRON HCL 4 MG/2ML IJ SOLN
INTRAMUSCULAR | Status: DC | PRN
  Administered 2012-05-17: 4 mg via INTRAVENOUS

## 2012-05-17 MED ORDER — DEXTROSE 5 % IV SOLN
500.0000 mg | Freq: Four times a day (QID) | INTRAVENOUS | Status: DC | PRN
  Filled 2012-05-17: qty 5

## 2012-05-17 MED ORDER — 0.9 % SODIUM CHLORIDE (POUR BTL) OPTIME
TOPICAL | Status: DC | PRN
  Administered 2012-05-17: 1000 mL

## 2012-05-17 MED ORDER — VANCOMYCIN HCL IN DEXTROSE 1-5 GM/200ML-% IV SOLN
1000.0000 mg | Freq: Two times a day (BID) | INTRAVENOUS | Status: AC
  Administered 2012-05-17 – 2012-05-18 (×2): 1000 mg via INTRAVENOUS
  Filled 2012-05-17 (×5): qty 200

## 2012-05-17 MED ORDER — VANCOMYCIN HCL 1000 MG IV SOLR
INTRAVENOUS | Status: AC
  Filled 2012-05-17: qty 3000

## 2012-05-17 MED ORDER — LIDOCAINE HCL (CARDIAC) 20 MG/ML IV SOLN
INTRAVENOUS | Status: DC | PRN
  Administered 2012-05-17: 80 mg via INTRAVENOUS

## 2012-05-17 MED ORDER — HYDROMORPHONE HCL PF 1 MG/ML IJ SOLN
INTRAMUSCULAR | Status: AC
  Filled 2012-05-17: qty 1

## 2012-05-17 MED ORDER — BUPIVACAINE-EPINEPHRINE PF 0.25-1:200000 % IJ SOLN
INTRAMUSCULAR | Status: DC | PRN
  Administered 2012-05-17: 30 mL

## 2012-05-17 MED ORDER — KETOROLAC TROMETHAMINE 30 MG/ML IJ SOLN
INTRAMUSCULAR | Status: AC
  Filled 2012-05-17: qty 1

## 2012-05-17 MED ORDER — LACTATED RINGERS IV SOLN
INTRAVENOUS | Status: DC | PRN
  Administered 2012-05-17: 08:00:00 via INTRAVENOUS

## 2012-05-17 MED ORDER — PROPOFOL 10 MG/ML IV EMUL
INTRAVENOUS | Status: DC | PRN
  Administered 2012-05-17: 110 mg via INTRAVENOUS

## 2012-05-17 MED ORDER — HYDROMORPHONE HCL PF 1 MG/ML IJ SOLN: 0.5000 mg | INTRAMUSCULAR | Status: DC | PRN

## 2012-05-17 MED ORDER — OXYCODONE HCL 5 MG PO TABS
5.0000 mg | ORAL_TABLET | ORAL | Status: DC | PRN
  Administered 2012-05-17 – 2012-05-20 (×8): 10 mg via ORAL
  Filled 2012-05-17 (×8): qty 2

## 2012-05-17 MED ORDER — METHOCARBAMOL 500 MG PO TABS
500.0000 mg | ORAL_TABLET | Freq: Four times a day (QID) | ORAL | Status: DC | PRN
  Administered 2012-05-17 – 2012-05-20 (×4): 500 mg via ORAL
  Filled 2012-05-17 (×4): qty 1

## 2012-05-17 MED ORDER — RIVAROXABAN 10 MG PO TABS
10.0000 mg | ORAL_TABLET | Freq: Every day | ORAL | Status: DC
  Administered 2012-05-18 – 2012-05-20 (×3): 10 mg via ORAL
  Filled 2012-05-17 (×4): qty 1

## 2012-05-17 MED ORDER — EPHEDRINE SULFATE 50 MG/ML IJ SOLN
INTRAMUSCULAR | Status: DC | PRN
  Administered 2012-05-17 (×3): 5 mg via INTRAVENOUS

## 2012-05-17 MED ORDER — ONDANSETRON HCL 4 MG PO TABS
4.0000 mg | ORAL_TABLET | Freq: Four times a day (QID) | ORAL | Status: DC | PRN
  Administered 2012-05-18: 4 mg via ORAL
  Filled 2012-05-17: qty 1

## 2012-05-17 MED ORDER — DOCUSATE SODIUM 100 MG PO CAPS
100.0000 mg | ORAL_CAPSULE | Freq: Two times a day (BID) | ORAL | Status: DC
  Filled 2012-05-17 (×6): qty 1

## 2012-05-17 MED ORDER — METOCLOPRAMIDE HCL 5 MG/ML IJ SOLN
5.0000 mg | Freq: Three times a day (TID) | INTRAMUSCULAR | Status: DC | PRN
  Administered 2012-05-18: 10 mg via INTRAVENOUS
  Filled 2012-05-17: qty 4

## 2012-05-17 MED ORDER — SODIUM CHLORIDE 0.9 % IR SOLN
Status: DC | PRN
  Administered 2012-05-17: 3000 mL

## 2012-05-17 MED ORDER — HYDROMORPHONE HCL PF 1 MG/ML IJ SOLN: 0.2500 mg | INTRAMUSCULAR | Status: DC | PRN

## 2012-05-17 MED ORDER — BUPIVACAINE-EPINEPHRINE PF 0.5-1:200000 % IJ SOLN
INTRAMUSCULAR | Status: DC | PRN
  Administered 2012-05-17: 20 mL

## 2012-05-17 MED ORDER — KETOROLAC TROMETHAMINE 15 MG/ML IJ SOLN
7.5000 mg | Freq: Four times a day (QID) | INTRAMUSCULAR | Status: DC
  Administered 2012-05-17: 7.5 mg via INTRAVENOUS

## 2012-05-17 MED ORDER — SODIUM CHLORIDE 0.9 % IV SOLN
INTRAVENOUS | Status: DC | PRN
  Administered 2012-05-17: 10:00:00 via INTRAVENOUS

## 2012-05-17 MED ORDER — ROCURONIUM BROMIDE 100 MG/10ML IV SOLN
INTRAVENOUS | Status: DC | PRN
  Administered 2012-05-17: 40 mg via INTRAVENOUS

## 2012-05-17 MED ORDER — MENTHOL 3 MG MT LOZG: 1.0000 | LOZENGE | OROMUCOSAL | Status: DC | PRN

## 2012-05-17 MED ORDER — FLEET ENEMA 7-19 GM/118ML RE ENEM: 1.0000 | ENEMA | Freq: Once | RECTAL | Status: AC | PRN

## 2012-05-17 MED ORDER — BUPIVACAINE-EPINEPHRINE PF 0.25-1:200000 % IJ SOLN
INTRAMUSCULAR | Status: AC
  Filled 2012-05-17: qty 30

## 2012-05-17 MED ORDER — LIDOCAINE HCL 4 % MT SOLN
OROMUCOSAL | Status: DC | PRN
  Administered 2012-05-17: 4 mL via TOPICAL

## 2012-05-17 MED ORDER — ONDANSETRON HCL 4 MG/2ML IJ SOLN: 4.0000 mg | Freq: Four times a day (QID) | INTRAMUSCULAR | Status: DC | PRN

## 2012-05-17 MED ORDER — SODIUM CHLORIDE 0.9 % IV SOLN: INTRAVENOUS | Status: DC

## 2012-05-17 MED ORDER — MAGNESIUM HYDROXIDE 400 MG/5ML PO SUSP: 30.0000 mL | Freq: Every day | ORAL | Status: DC | PRN

## 2012-05-17 MED ORDER — BUPIVACAINE HCL (PF) 0.5 % IJ SOLN
INTRAMUSCULAR | Status: AC
Start: 2012-05-17 — End: 2012-05-17
  Filled 2012-05-17: qty 30

## 2012-05-17 MED ORDER — ACETAMINOPHEN 10 MG/ML IV SOLN
1000.0000 mg | Freq: Four times a day (QID) | INTRAVENOUS | Status: AC
  Administered 2012-05-17 – 2012-05-18 (×4): 1000 mg via INTRAVENOUS
  Filled 2012-05-17 (×4): qty 100

## 2012-05-17 MED ORDER — METOCLOPRAMIDE HCL 10 MG PO TABS: 5.0000 mg | ORAL_TABLET | Freq: Three times a day (TID) | ORAL | Status: DC | PRN

## 2012-05-17 SURGICAL SUPPLY — 82 items
ADAPTER BOLT FEMORAL +2/-2 (Knees) ×1 IMPLANT
ADPR FEM +2/-2 OFST BOLT (Knees) ×1 IMPLANT
ADPR FEM 5D STRL KN PFC SGM (Orthopedic Implant) ×1 IMPLANT
ANCH SUT 2 CP-2 EBND QANCHR+ (Anchor) ×2 IMPLANT
ANCHOR SUPER QUICK (Anchor) ×2 IMPLANT
AUG FEM SZ3 4 CMB POST STRL LF (Knees) ×1 IMPLANT
AUG FEM SZ3 4 STRL LF KN RT TI (Knees) ×2 IMPLANT
AUG FEM SZ3 8 CMB POST STRL LF (Knees) ×1 IMPLANT
BANDAGE ESMARK 6X9 LF (GAUZE/BANDAGES/DRESSINGS) ×1 IMPLANT
BLADE SAGITTAL 25.0X1.19X90 (BLADE) ×2 IMPLANT
BLADE SAW SGTL 13.0X1.19X90.0M (BLADE) ×1 IMPLANT
BLADE SURG 10 STRL SS (BLADE) ×2 IMPLANT
BNDG CMPR 9X6 STRL LF SNTH (GAUZE/BANDAGES/DRESSINGS) ×2
BNDG ESMARK 6X9 LF (GAUZE/BANDAGES/DRESSINGS) ×4
BOWL SMART MIX CTS (DISPOSABLE) ×1 IMPLANT
CEMENT HV SMART SET (Cement) ×5 IMPLANT
CEMENT RESTRICTOR DEPUY SZ 4 (Cement) ×1 IMPLANT
CLOTH BEACON ORANGE TIMEOUT ST (SAFETY) ×2 IMPLANT
COMP FEM CEM RT SZ3 (Orthopedic Implant) ×2 IMPLANT
COMPONENT FEM CEM RT SZ3 (Orthopedic Implant) IMPLANT
CONT SPEC 4OZ CLIKSEAL STRL BL (MISCELLANEOUS) ×2 IMPLANT
COVER SURGICAL LIGHT HANDLE (MISCELLANEOUS) ×3 IMPLANT
CUFF TOURNIQUET SINGLE 34IN LL (TOURNIQUET CUFF) ×1 IMPLANT
CUFF TOURNIQUET SINGLE 44IN (TOURNIQUET CUFF) IMPLANT
DISTAL WEDGE PFC 4MM RIGHT (Knees) ×4 IMPLANT
DRAPE EXTREMITY T 121X128X90 (DRAPE) ×2 IMPLANT
DRSG ADAPTIC 3X8 NADH LF (GAUZE/BANDAGES/DRESSINGS) ×2 IMPLANT
DRSG PAD ABDOMINAL 8X10 ST (GAUZE/BANDAGES/DRESSINGS) ×3 IMPLANT
DURAPREP 26ML APPLICATOR (WOUND CARE) ×2 IMPLANT
ELECT REM PT RETURN 9FT ADLT (ELECTROSURGICAL) ×2
ELECTRODE REM PT RTRN 9FT ADLT (ELECTROSURGICAL) ×1 IMPLANT
EVACUATOR 1/8 PVC DRAIN (DRAIN) ×1 IMPLANT
FACESHIELD LNG OPTICON STERILE (SAFETY) ×4 IMPLANT
FEMORAL ADAPTER (Orthopedic Implant) ×1 IMPLANT
GLOVE BIOGEL PI IND STRL 8 (GLOVE) ×1 IMPLANT
GLOVE BIOGEL PI IND STRL 8.5 (GLOVE) IMPLANT
GLOVE BIOGEL PI INDICATOR 8 (GLOVE) ×1
GLOVE BIOGEL PI INDICATOR 8.5 (GLOVE)
GLOVE ECLIPSE 8.0 STRL XLNG CF (GLOVE) ×3 IMPLANT
GOWN PREVENTION PLUS XLARGE (GOWN DISPOSABLE) ×3 IMPLANT
GOWN STRL NON-REIN LRG LVL3 (GOWN DISPOSABLE) ×4 IMPLANT
HANDPIECE INTERPULSE COAX TIP (DISPOSABLE) ×2
INSERT TC3 RP TIBIAL SZ 3.0 (Knees) ×1 IMPLANT
KIT BASIN OR (CUSTOM PROCEDURE TRAY) ×2 IMPLANT
KIT ROOM TURNOVER OR (KITS) ×2 IMPLANT
MANIFOLD NEPTUNE II (INSTRUMENTS) ×2 IMPLANT
NEEDLE 22X1 1/2 (OR ONLY) (NEEDLE) ×1 IMPLANT
NS IRRIG 1000ML POUR BTL (IV SOLUTION) ×2 IMPLANT
PACK TOTAL JOINT (CUSTOM PROCEDURE TRAY) ×2 IMPLANT
PAD ARMBOARD 7.5X6 YLW CONV (MISCELLANEOUS) ×4 IMPLANT
PAD CAST 4YDX4 CTTN HI CHSV (CAST SUPPLIES) ×1 IMPLANT
PADDING CAST ABS 4INX4YD NS (CAST SUPPLIES) ×1
PADDING CAST ABS 6INX4YD NS (CAST SUPPLIES) ×1
PADDING CAST ABS COTTON 4X4 ST (CAST SUPPLIES) IMPLANT
PADDING CAST ABS COTTON 6X4 NS (CAST SUPPLIES) IMPLANT
PADDING CAST COTTON 4X4 STRL (CAST SUPPLIES) ×2
PATELLA DOME PFC 38MM (Knees) ×1 IMPLANT
POST AVE PFC 4MM (Knees) ×1 IMPLANT
POST AVE PFC 8MM (Knees) ×1 IMPLANT
SET HNDPC FAN SPRY TIP SCT (DISPOSABLE) ×1 IMPLANT
SPONGE GAUZE 4X4 12PLY (GAUZE/BANDAGES/DRESSINGS) ×2 IMPLANT
STAPLER VISISTAT 35W (STAPLE) ×2 IMPLANT
STEM TIBIA PFC 13X30MM (Stem) ×1 IMPLANT
STEM UNIVERSAL REVISION 75X14 (Stem) ×1 IMPLANT
SUCTION FRAZIER TIP 10 FR DISP (SUCTIONS) ×2 IMPLANT
SUT BONE WAX W31G (SUTURE) ×2 IMPLANT
SUT ETHIBOND NAB CT1 #1 30IN (SUTURE) ×8 IMPLANT
SUT VIC AB 0 CT1 27 (SUTURE) ×2
SUT VIC AB 0 CT1 27XBRD ANBCTR (SUTURE) ×1 IMPLANT
SUT VIC AB 2-0 CT1 27 (SUTURE) ×4
SUT VIC AB 2-0 CT1 TAPERPNT 27 (SUTURE) IMPLANT
SUT VIC AB 2-0 FS1 27 (SUTURE) ×4 IMPLANT
SWAB COLLECTION DEVICE MRSA (MISCELLANEOUS) ×1 IMPLANT
SYR CONTROL 10ML LL (SYRINGE) ×1 IMPLANT
THERMADRAPE WRAP 6X3 (GAUZE/BANDAGES/DRESSINGS) ×1 IMPLANT
TOWER CARTRIDGE SMART MIX (DISPOSABLE) ×1 IMPLANT
TRAY FOLEY CATH 14FR (SET/KITS/TRAYS/PACK) ×2 IMPLANT
TRAY REVISION SZ 3 (Knees) ×1 IMPLANT
TRAY SLEEVE CEM ML (Knees) ×1 IMPLANT
TUBE ANAEROBIC SPECIMEN COL (MISCELLANEOUS) ×1 IMPLANT
WATER STERILE IRR 1000ML POUR (IV SOLUTION) ×6 IMPLANT
WEDGE DISTAL PFC RIGHT 4MM (Knees) IMPLANT

## 2012-05-17 NOTE — Progress Notes (Signed)
UR COMPLETED  

## 2012-05-17 NOTE — Transfer of Care (Signed)
Immediate Anesthesia Transfer of Care Note  Patient: Tonya [name]  Procedure(s) Performed: Procedure(s) (LRB): TOTAL KNEE REVISION (Right)  Patient Location: PACU  Anesthesia Type: General  Level of Consciousness: awake, sedated and patient cooperative  Airway & Oxygen Therapy: Patient Spontanous Breathing and Patient connected to nasal cannula oxygen  Post-op Assessment: Report given to PACU RN and Post -op Vital signs reviewed and stable  Post vital signs: Reviewed and stable  Complications: No apparent anesthesia complications

## 2012-05-17 NOTE — Anesthesia Postprocedure Evaluation (Signed)
  Anesthesia Post-op Note  Patient: Tonya [name]  Procedure(s) Performed: Procedure(s) (LRB): TOTAL KNEE REVISION (Right)  Patient Location: PACU  Anesthesia Type: General and GA combined with regional for post-op pain  Level of Consciousness: awake, alert , oriented and patient cooperative  Airway and Oxygen Therapy: Patient Spontanous Breathing and Patient connected to nasal cannula oxygen  Post-op Pain: mild  Post-op Assessment: Post-op Vital signs reviewed, Patient's Cardiovascular Status Stable, Respiratory Function Stable, Patent Airway, No signs of Nausea or vomiting and Pain level controlled  Post-op Vital Signs: stable  Complications: No apparent anesthesia complications

## 2012-05-17 NOTE — Preoperative (Signed)
Beta Blockers   Reason not to administer Beta Blockers:Coreg 0430 today

## 2012-05-17 NOTE — Anesthesia Preprocedure Evaluation (Addendum)
Anesthesia Evaluation  Patient identified by MRN, date of birth, ID band Patient awake    Reviewed: Allergy & Precautions, H&P , NPO status , Patient's Chart, lab work & pertinent test results, reviewed documented beta blocker date and time   Airway Mallampati: I TM Distance: >3 FB Neck ROM: full    Dental  (+) Edentulous Upper and Dental Advisory Given   Pulmonary          Cardiovascular hypertension, Pt. on home beta blockers + CAD + dysrhythmias Atrial Fibrillation     Neuro/Psych Anxiety Depression    GI/Hepatic GERD-  Medicated and Controlled,  Endo/Other  Well Controlled, Type 2, Oral Hypoglycemic Agents  Renal/GU      Musculoskeletal   Abdominal   Peds  Hematology   Anesthesia Other Findings   Reproductive/Obstetrics                        Anesthesia Physical Anesthesia Plan  ASA: III  Anesthesia Plan: General   Post-op Pain Management:    Induction: Intravenous  Airway Management Planned: Oral ETT  Additional Equipment:   Intra-op Plan:   Post-operative Plan: Extubation in OR  Informed Consent: I have reviewed the patients History and Physical, chart, labs and discussed the procedure including the risks, benefits and alternatives for the proposed anesthesia with the patient or authorized representative who has indicated his/her understanding and acceptance.     Plan Discussed with: CRNA, Anesthesiologist and Surgeon  Anesthesia Plan Comments:         Anesthesia Quick Evaluation

## 2012-05-17 NOTE — Progress Notes (Signed)
Orthopedic Tech Progress Note Patient Details:  Tonya [name] 1940/11/09 [id number]  CPM Right Knee CPM Right Knee: On Right Knee Flexion (Degrees): 60  Right Knee Extension (Degrees): 0  Additional Comments: trapeze bar   Cammer, Theodoro Parma 05/17/2012, 1:11 PM

## 2012-05-17 NOTE — Progress Notes (Signed)
Patient ID: Tonya [name], female   DOB: 07-07-1941, 71 y.o.   MRN: [id number] There has been no change in health status since  the current H&P.I have examined the patient and discussed the surgery. No contraindications to the planned procedure exist.

## 2012-05-17 NOTE — Brief Op Note (Signed)
05/17/2012  12:11 PM  PATIENT:  Tonya [name]  71 y.o. female  PRE-OPERATIVE DIAGNOSIS:  failed total knee/loosening, s/p I&D antibiotic spacer  POST-OPERATIVE DIAGNOSIS:  failed total knee/loosening, s/p I&D antibiotic spacer  PROCEDURE:  Procedure(s) (LRB): TOTAL KNEE REVISION (Right)  SURGEON:  Surgeon(s) and Role:    * Garald Balding, MD - Primary  PHYSICIAN ASSISTANT: Biagio Borg, PAC  ASSISTANTS: none  ANESTHESIA:   regional and general  EBL:  Total I/O In: 2000 [I.V.:2000] Out: 450 [Urine:300; Blood:150]  BLOOD ADMINISTERED:none  DRAINS: (right knee) Hemovact drain(s) in the right knee with  Suction Open   LOCAL MEDICATIONS USED:  MARCAINE     SPECIMEN:  Source of Specimen:  fluid and synovium from right knee  DISPOSITION OF SPECIMEN:  N/A  COUNTS:  YES  TOURNIQUET: first tourniquet 108 min, second tourniquet about 30 min (17 min tourniquett down between tourniquets)  DICTATION: .Other Dictation: Dictation Number [id number]  PLAN OF CARE: Admit to inpatient   PATIENT DISPOSITION:  PACU - hemodynamically stable.   Delay start of Pharmacological VTE agent (>24hrs) due to surgical blood loss or risk of bleeding: not applicable

## 2012-05-17 NOTE — Plan of Care (Signed)
Problem: Consults Goal: Diagnosis- Total Joint Replacement Revision Total Knee right

## 2012-05-17 NOTE — Anesthesia Procedure Notes (Addendum)
Anesthesia Regional Block:  Femoral nerve block  Pre-Anesthetic Checklist: ,, timeout performed, Correct Patient, Correct Site, Correct Laterality, Correct Procedure, Correct Position, site marked, Risks and benefits discussed,  Surgical consent,  Pre-op evaluation,  At surgeon's request and post-op pain management  Laterality: Right  Prep: Maximum Sterile Barrier Precautions used, chloraprep and alcohol swabs       Needles:   Needle Type: Stimulator Needle - 80          Additional Needles:  Procedures: nerve stimulator Femoral nerve block  Nerve Stimulator or Paresthesia:  Response: 0.5 mA, 0.1 ms, 5 cm  Additional Responses:   Narrative:  Start time: 05/17/2012 7:55 AM End time: 05/17/2012 8:00 AM Injection made incrementally with aspirations every 5 mL.  Performed by: Personally  Anesthesiologist: Sharolyn Douglas MD  Additional Notes: Pt accepts procedure and risks.  20cc 0.5% Marcaine w/ epi w/o discomfort or difficulty. GES   Procedure Name: Intubation Date/Time: 05/17/2012 8:49 AM Performed by: Williemae Area B Pre-anesthesia Checklist: Patient identified, Emergency Drugs available, Suction available and Patient being monitored Patient Re-evaluated:Patient Re-evaluated prior to inductionOxygen Delivery Method: Circle system utilized Preoxygenation: Pre-oxygenation with 100% oxygen Intubation Type: IV induction Ventilation: Mask ventilation without difficulty Laryngoscope Size: Mac and 3 Grade View: Grade I Tube type: Oral Tube size: 7.5 mm Number of attempts: 1 Airway Equipment and Method: Stylet Placement Confirmation: ETT inserted through vocal cords under direct vision,  breath sounds checked- equal and bilateral and positive ETCO2 Secured at: 21 (cm at upper gum) cm Tube secured with: Tape Dental Injury: Teeth and Oropharynx as per pre-operative assessment

## 2012-05-18 ENCOUNTER — Encounter (HOSPITAL_COMMUNITY): Payer: Self-pay | Admitting: Orthopaedic Surgery

## 2012-05-18 DIAGNOSIS — I10 Essential (primary) hypertension: Secondary | ICD-10-CM

## 2012-05-18 DIAGNOSIS — E119 Type 2 diabetes mellitus without complications: Secondary | ICD-10-CM

## 2012-05-18 DIAGNOSIS — E785 Hyperlipidemia, unspecified: Secondary | ICD-10-CM

## 2012-05-18 DIAGNOSIS — R509 Fever, unspecified: Secondary | ICD-10-CM

## 2012-05-18 LAB — BASIC METABOLIC PANEL
BUN: 15 mg/dL (ref 6–23)
CO2: 25 mEq/L (ref 19–32)
Calcium: 6.8 mg/dL — ABNORMAL LOW (ref 8.4–10.5)
Chloride: 107 mEq/L (ref 96–112)
Creatinine, Ser: 1.05 mg/dL (ref 0.50–1.10)
Glucose, Bld: 134 mg/dL — ABNORMAL HIGH (ref 70–99)

## 2012-05-18 LAB — CBC
HCT: 23.1 % — ABNORMAL LOW (ref 36.0–46.0)
Hemoglobin: 7.9 g/dL — ABNORMAL LOW (ref 12.0–15.0)
MCH: 28.4 pg (ref 26.0–34.0)
MCV: 83.1 fL (ref 78.0–100.0)
RBC: 2.78 MIL/uL — ABNORMAL LOW (ref 3.87–5.11)

## 2012-05-18 MED ORDER — ADULT MULTIVITAMIN W/MINERALS CH
1.0000 | ORAL_TABLET | Freq: Every day | ORAL | Status: DC
  Administered 2012-05-18 – 2012-05-20 (×3): 1 via ORAL
  Filled 2012-05-18 (×3): qty 1

## 2012-05-18 MED ORDER — INSULIN ASPART 100 UNIT/ML ~~LOC~~ SOLN
0.0000 [IU] | Freq: Three times a day (TID) | SUBCUTANEOUS | Status: DC
  Administered 2012-05-19: 2 [IU] via SUBCUTANEOUS
  Administered 2012-05-20 (×2): 1 [IU] via SUBCUTANEOUS

## 2012-05-18 MED ORDER — SODIUM CHLORIDE 0.9 % IV SOLN
INTRAVENOUS | Status: DC
  Administered 2012-05-18: 17:00:00 via INTRAVENOUS

## 2012-05-18 MED ORDER — METFORMIN HCL 500 MG PO TABS
250.0000 mg | ORAL_TABLET | Freq: Two times a day (BID) | ORAL | Status: DC
  Administered 2012-05-18 (×2): 250 mg via ORAL
  Filled 2012-05-18 (×3): qty 1

## 2012-05-18 MED ORDER — CHOLESTYRAMINE LIGHT 4 G PO PACK
4.0000 g | PACK | Freq: Two times a day (BID) | ORAL | Status: DC
  Administered 2012-05-19: 4 g via ORAL
  Filled 2012-05-18 (×5): qty 1

## 2012-05-18 MED ORDER — HYDROCHLOROTHIAZIDE 25 MG PO TABS
25.0000 mg | ORAL_TABLET | Freq: Every day | ORAL | Status: DC
  Administered 2012-05-18 – 2012-05-20 (×3): 25 mg via ORAL
  Filled 2012-05-18 (×3): qty 1

## 2012-05-18 MED ORDER — CALCIUM CARBONATE-VITAMIN D 500-200 MG-UNIT PO TABS
1.0000 | ORAL_TABLET | Freq: Two times a day (BID) | ORAL | Status: DC
  Administered 2012-05-18 – 2012-05-20 (×5): 1 via ORAL
  Filled 2012-05-18 (×6): qty 1

## 2012-05-18 MED ORDER — PNEUMOCOCCAL VAC POLYVALENT 25 MCG/0.5ML IJ INJ
0.5000 mL | INJECTION | INTRAMUSCULAR | Status: DC
  Filled 2012-05-18: qty 0.5

## 2012-05-18 MED ORDER — DILTIAZEM HCL ER 240 MG PO CP24
240.0000 mg | ORAL_CAPSULE | Freq: Every day | ORAL | Status: DC
  Administered 2012-05-18 – 2012-05-20 (×3): 240 mg via ORAL
  Filled 2012-05-18 (×3): qty 1

## 2012-05-18 MED ORDER — OMEGA-3-ACID ETHYL ESTERS 1 G PO CAPS
2.0000 g | ORAL_CAPSULE | Freq: Three times a day (TID) | ORAL | Status: DC
  Administered 2012-05-18 – 2012-05-20 (×6): 2 g via ORAL
  Filled 2012-05-18 (×10): qty 2

## 2012-05-18 MED ORDER — GLYBURIDE-METFORMIN 1.25-250 MG PO TABS: 1.0000 | ORAL_TABLET | Freq: Two times a day (BID) | ORAL | Status: DC

## 2012-05-18 MED ORDER — FERROUS SULFATE 325 (65 FE) MG PO TBEC: 325.0000 mg | DELAYED_RELEASE_TABLET | Freq: Two times a day (BID) | ORAL | Status: DC

## 2012-05-18 MED ORDER — CARVEDILOL 12.5 MG PO TABS
12.5000 mg | ORAL_TABLET | Freq: Two times a day (BID) | ORAL | Status: DC
  Administered 2012-05-18 – 2012-05-20 (×5): 12.5 mg via ORAL
  Filled 2012-05-18 (×7): qty 1

## 2012-05-18 MED ORDER — DOXYCYCLINE HYCLATE 100 MG PO TABS
100.0000 mg | ORAL_TABLET | Freq: Two times a day (BID) | ORAL | Status: DC
  Administered 2012-05-18 – 2012-05-20 (×4): 100 mg via ORAL
  Filled 2012-05-18 (×8): qty 1

## 2012-05-18 MED ORDER — ACETAMINOPHEN 10 MG/ML IV SOLN: 1000.0000 mg | Freq: Four times a day (QID) | INTRAVENOUS | Status: DC

## 2012-05-18 MED ORDER — CITALOPRAM HYDROBROMIDE 40 MG PO TABS
40.0000 mg | ORAL_TABLET | Freq: Every day | ORAL | Status: DC
  Administered 2012-05-18 – 2012-05-20 (×3): 40 mg via ORAL
  Filled 2012-05-18 (×3): qty 1

## 2012-05-18 MED ORDER — FERROUS SULFATE 325 (65 FE) MG PO TABS
325.0000 mg | ORAL_TABLET | Freq: Two times a day (BID) | ORAL | Status: DC
  Administered 2012-05-18 – 2012-05-20 (×5): 325 mg via ORAL
  Filled 2012-05-18 (×7): qty 1

## 2012-05-18 MED ORDER — GLYBURIDE 1.25 MG PO TABS
1.2500 mg | ORAL_TABLET | Freq: Two times a day (BID) | ORAL | Status: DC
  Administered 2012-05-18 – 2012-05-20 (×5): 1.25 mg via ORAL
  Filled 2012-05-18 (×7): qty 1

## 2012-05-18 MED ORDER — ATORVASTATIN CALCIUM 20 MG PO TABS
20.0000 mg | ORAL_TABLET | Freq: Every day | ORAL | Status: DC
  Administered 2012-05-18 – 2012-05-19 (×2): 20 mg via ORAL
  Filled 2012-05-18 (×3): qty 1

## 2012-05-18 MED ORDER — PANTOPRAZOLE SODIUM 40 MG PO TBEC
40.0000 mg | DELAYED_RELEASE_TABLET | Freq: Every day | ORAL | Status: DC
  Administered 2012-05-18 – 2012-05-20 (×3): 40 mg via ORAL
  Filled 2012-05-18 (×3): qty 1

## 2012-05-18 MED ORDER — ACETAMINOPHEN 10 MG/ML IV SOLN
1000.0000 mg | Freq: Four times a day (QID) | INTRAVENOUS | Status: AC
  Administered 2012-05-18 – 2012-05-19 (×4): 1000 mg via INTRAVENOUS
  Filled 2012-05-18 (×4): qty 100

## 2012-05-18 MED ORDER — POTASSIUM CHLORIDE CRYS ER 20 MEQ PO TBCR
20.0000 meq | EXTENDED_RELEASE_TABLET | Freq: Two times a day (BID) | ORAL | Status: DC
  Administered 2012-05-18 – 2012-05-20 (×5): 20 meq via ORAL
  Filled 2012-05-18 (×2): qty 1
  Filled 2012-05-18: qty 2
  Filled 2012-05-18 (×5): qty 1

## 2012-05-18 MED ORDER — CHOLESTYRAMINE LIGHT 4 G PO PACK: 4.0000 g | PACK | Freq: Two times a day (BID) | ORAL | Status: DC

## 2012-05-18 NOTE — Progress Notes (Signed)
Patient vomiting, shaking and having sweats/chills and pale. Took vitals and tmp elevated to 102.7. Called MD and began IV Tylenol infusion.

## 2012-05-18 NOTE — Care Management Note (Signed)
    Page 1 of 1   05/18/2012     12:22:58 PM   CARE MANAGEMENT NOTE 05/18/2012  Patient:  Tonya [name], Tonya [name]   Account Number:  [id number]  Date Initiated:  05/18/2012  Documentation initiated by:  Rozanna Boer  Subjective/Objective Assessment:   POD#1 s/p tka Revision  plans to d/c home with daughter  [facility] services pre-arranged by MD office, has DME  d/c on Xarelto     Action/Plan:   Home with [facility] services   Anticipated DC Date:  05/20/2012   Anticipated DC Plan:  [place]  CM consult      Choice offered to / List presented to:             Status of service:  Completed, signed off Medicare Important Message given?   (If response is "NO", the following Medicare IM given date fields will be blank) Date Medicare IM given:   Date Additional Medicare IM given:    Discharge Disposition:  [place]  Per UR Regulation:  Reviewed for med. necessity/level of care/duration of stay  If discussed at [place] of Stay Meetings, dates discussed:    Comments:  05/18/12  12:19 Rozanna Boer RN/CM Per patient she plans to d/c home with daughter, has RW/CPM, does not need BSC due to having elevated toilet seat with hand rails. Will d/c home on Xarelto, no prior auth required, copay is $41.00, [facility] [place] [facility] and [facility] do [facility] X[facility]to available MD office has pre-arranged for [facility] services with [place].

## 2012-05-18 NOTE — Progress Notes (Signed)
Patient ID: Tonya [name], female   DOB: 1941-05-17, 71 y.o.   MRN: [id number] PATIENT ID: Tonya [name]        MRN:  [id number]          DOB/AGE: 1941-07-27 / 71 y.o.  Tonya Fears, MD   Biagio Borg, PA-C [place], [place], [place]  [id number]                             [phone]   PROGRESS NOTE  Subjective:  negative for Chest Pain  negative for Shortness of Breath  negative for Nausea/Vomiting   negative for Calf Pain  negative for Bowel Movement   Tolerating Diet: yes         Patient reports pain as mild.       Objective: Vital signs in last 24 hours:   Patient Vitals for the past 24 hrs:  BP Temp Temp src Pulse Resp SpO2 Height Weight  05/18/12 0605 123/45 mmHg 98.9 F (37.2 C) - 81  18  97 % - -  05/18/12 0400 - - - - 18  97 % - -  05/17/12 2205 146/48 mmHg 99.7 F (37.6 C) - 76  17  97 % - -  05/17/12 2000 - - - - 18  98 % - -  05/17/12 1605 - - - - - 96 % 5\' 6"  (1.676 m) 75.796 kg (167 lb 1.6 oz)  05/17/12 1600 140/56 mmHg 98.3 F (36.8 C) Oral 68  18  93 % - -  05/17/12 1530 - 98.2 F (36.8 C) - - - - - -  05/17/12 1223 148/63 mmHg - - - 16  98 % - -  05/17/12 1215 164/67 mmHg 98.6 F (37 C) - - 14  99 % - -      Intake/Output from previous day:   08/20 0701 - 08/21 0700 In: 2000 [I.V.:2000] Out: 1610 [Urine:860; Drains:600]   Intake/Output this shift:       Intake/Output      08/20 0701 - 08/21 0700 08/21 0701 - 08/22 0700   I.V. (mL/kg) 2000 (26.4)    Total Intake(mL/kg) 2000 (26.4)    Urine (mL/kg/hr) 860 (0.5)    Drains 600    Blood 150    Total Output 1610    Net +390            LABORATORY DATA:  Basename 05/18/12 0700 05/11/12 1218  WBC 8.4 8.6  HGB 7.9* 12.5  HCT 23.1* 37.5  PLT 135* 230    Basename 05/11/12 1209  NA 143  K 3.7  CL 105  CO2 27  BUN 18  CREATININE 0.98  GLUCOSE 117*  CALCIUM 8.8   Lab Results  Component Value Date   INR 0.90 05/11/2012   INR 1.04 03/02/2012   INR 1.78* 05/31/2010     Recent Radiographic Studies :  No results found.   Examination:  General appearance: alert and no distress  Wound Exam: clean, dry, intact   Drainage:  100 cc in hemovac in last 8 hours  Motor Exam: EHL, FHL, Anterior Tibial and Posterior Tibial Intact  Sensory Exam: Superficial Peroneal, Deep Peroneal and Tibial normal  Vascular Exam: Normal  Assessment:    1 Day Post-Op  Procedure(s) (LRB): TOTAL KNEE REVISION (Right)  ADDITIONAL DIAGNOSIS:  Active Problems:  * No active hospital problems. *   Acute Blood Loss Anemia   Plan:  Physical Therapy as ordered Partial Weight Bearing @ 50% (PWB)  DVT Prophylaxis:  Xarelto  DISCHARGE PLAN: Home  DISCHARGE NEEDS: has equipment at home    Will transfuse 2 units packed cells, OOB with PT, D/C hemovac in am     [facility] 05/18/2012, 7:59 AM

## 2012-05-18 NOTE — Evaluation (Signed)
Physical Therapy Evaluation Patient Details Name: Tonya [name] MRN: [id number] DOB: 1940-12-20 Today's Date: 05/18/2012 Time: 0940-1007 PT Time Calculation (min): 27 min  PT Assessment / Plan / Recommendation Clinical Impression  Pt is a 71 y/o female post op day 1 of R TKA revision.  Pt particpation in PT today was limited by low Hgb.  Pt to receive transfusion today.   Acute PT to follow pt to maximize independence and mobillity      PT Assessment  Patient needs continued PT services    Follow Up Recommendations  Home health PT;Supervision - Intermittent    Barriers to Discharge None      Equipment Recommendations  None recommended by PT    Recommendations for Other Services     Frequency 7X/week    Precautions / Restrictions Precautions Precautions: Knee Precaution Booklet Issued: Yes (comment) Restrictions Weight Bearing Restrictions: Yes RLE Weight Bearing: Partial weight bearing RLE Partial Weight Bearing Percentage or Pounds: 50%    Pertinent Vitals/Pain Pt reports pain in knee 5/10.  Premedicated.   BP in sitting 138/52 pt c/o mild dizziness and nausea.      Mobility  Bed Mobility Bed Mobility: Supine to Sit;Sitting - Scoot to Edge of Bed Supine to Sit: HOB flat;3: Mod assist Sitting - Scoot to Edge of Bed: 4: Min assist Details for Bed Mobility Assistance: Assist for R LE secondary to pain.   Transfers Transfers: Sit to Stand;Stand Pivot Transfers;Stand to Sit Sit to Stand: 3: Mod assist;From elevated surface;With upper extremity assist;From bed Stand to Sit: 4: Min assist;With upper extremity assist;To chair/3-in-1 Stand Pivot Transfers: 3: Mod assist;1: +1 Total assist Details for Transfer Assistance: Cueing for hand placement and technique.  Ambulation/Gait Ambulation/Gait Assistance: Not tested (comment) Wheelchair Mobility Wheelchair Mobility: No    Exercises Total Joint Exercises Ankle Circles/Pumps: AROM;Strengthening;10 reps;Sidelying Quad  Sets: AAROM;10 reps Goniometric ROM: 6 - 48 degres.     PT Diagnosis: Difficulty walking;Abnormality of gait;Generalized weakness;Acute pain  PT Problem List: Decreased strength;Decreased activity tolerance;Decreased mobility;Decreased range of motion;Decreased knowledge of use of DME;Decreased safety awareness;Pain PT Treatment Interventions: DME instruction;Gait training;Stair training;Therapeutic activities;Functional mobility training;Therapeutic exercise;Patient/family education;Manual techniques   PT Goals Acute Rehab PT Goals PT Goal Formulation: With patient Time For Goal Achievement: 05/25/12 Potential to Achieve Goals: Good Pt will go Supine/Side to Sit: with supervision PT Goal: Supine/Side to Sit - Progress: Goal set today Pt will go Sit to Supine/Side: with supervision PT Goal: Sit to Supine/Side - Progress: Goal set today Pt will go Sit to Stand: with supervision PT Goal: Sit to Stand - Progress: Goal set today Pt will go Stand to Sit: with supervision PT Goal: Stand to Sit - Progress: Goal set today Pt will Transfer Bed to Chair/Chair to Bed: with supervision PT Transfer Goal: Bed to Chair/Chair to Bed - Progress: Goal set today Pt will Ambulate: 51 - 150 feet;with supervision;with rolling walker PT Goal: Ambulate - Progress: Goal set today Pt will Go Up / Down Stairs: 1-2 stairs;with supervision;with rolling walker PT Goal: Up/Down Stairs - Progress: Goal set today Pt will Perform Home Exercise Program: with supervision, verbal cues required/provided PT Goal: Perform Home Exercise Program - Progress: Goal set today  Visit Information  Last PT Received On: 05/18/12 Assistance Needed: +1    Subjective Data  Subjective: agree to PT eval   Prior Functioning  Home Living Lives With: Daughter;Family Available Help at Discharge: Family Type of Home: House Home Access: Stairs to enter [employer] of  Steps: 1 Entrance Stairs-Rails: None Home Layout: One  level Bathroom Shower/Tub: Tub/shower unit;C[employer]ist, clinical: Standard Bathroom Accessibility: Yes How Accessible: Accessible via walker Home Adaptive Equipment: Straight cane;Shower chair without back;Walker - rolling;Wheelchair - manual Prior Function Level of Independence: Independent with assistive device(s) Able to Take Stairs?: Yes Driving: No Vocation: Retired    Associate Professor  Overall Cognitive Status: Appears within functional limits for tasks assessed/performed Arousal/Alertness: Awake/alert Orientation Level: Appears intact for tasks assessed;Oriented X4 / Intact Behavior During Session: [facility] for tasks performed    Extremity/Trunk Assessment Right Upper Extremity Assessment RUE ROM/Strength/Tone: [facility] for tasks assessed Left Upper Extremity Assessment LUE ROM/Strength/Tone: WFL for tasks assessed Right Lower Extremity Assessment RLE ROM/Strength/Tone: Deficits RLE ROM/Strength/Tone Deficits: Limited ROM and strength secondary to surgery.  Unable to lift R LE independently.   Left Lower Extremity Assessment LLE ROM/Strength/Tone: WFL for tasks assessed Trunk Assessment Trunk Assessment: Normal   Balance Balance Balance Assessed: Yes Static Sitting Balance Static Sitting - Balance Support: Bilateral upper extremity supported;Feet supported Static Sitting - Level of Assistance: 6: Modified independent (Device/Increase time) Static Sitting - Comment/# of Minutes: 5+ minutes sittion on EOB with initial c.o dizziness and nausea.   Dizziness resolved within a minute but pt continued to c/o nausea util she vommittedd. Pt felt better after vommitting.   End of Session PT - End of Session Equipment Utilized During Treatment: Gait belt;Right knee immobilizer Activity Tolerance: Patient limited by fatigue;Patient limited by pain Patient left: in chair;with call bell/phone within reach Nurse Communication: Mobility status;Weight bearing status CPM Right Knee CPM Right Knee: Off   GP     Carlitos Bottino 05/18/2012, 1:14 PM Camren Henthorn L. Alysha Doolan DPT [phone]

## 2012-05-18 NOTE — Consult Note (Addendum)
Patient's PCP: [name], MD  Referring physician:  Dr. Durward Fortes  Chief Complaint: Fever and elevated blood pressure  History of Present Illness: Tonya [name] is a 71 y.o. Caucasian female with history of osteoarthritis, hypertension, diabetes, hyperlipidemia, GERD, history of failed total knee/loosening, status post I&D antibiotic space had a total knee revision on 05/17/2012.  Patient had blood loss from surgery and from the drain placement.  She was being given 2 units of blood, patient developed fever and elevated blood blood pressure.  The hospitalist service was consulted for further care and management.  After patient completed her blood transfusion in fever resolved.  Prior to admission patient denies any recent fevers, chills, nausea, vomiting, chest pain, shortness of breath, abdominal pain, diarrhea, headaches, or vision changes.  Since surgery she has been feeling nauseated and has vomited.  Past Medical History  Diagnosis Date  . Osteoarthritis   . Edema, peripheral   . CAD (coronary artery disease)     s/p BMS to LAD  . Breast pain     left  . Hyperlipidemia   . Hypertension   . Sinusitis     acute- NOS  . Allergic rhinitis   . Blood in stool   . Adenocarcinoma, colon   . Anemia   . Sciatica   . Cystitis, acute   . Chronic diarrhea   . Nausea   . GERD (gastroesophageal reflux disease)   . Depression   . Anxiety   . Osteoarthritis   . IBS (irritable bowel syndrome)   . Degenerative disk disease   . Carpal tunnel syndrome   . Diabetes mellitus, type 2   . Family history of diabetes mellitus   . History of pneumonia   . Urinary frequency   . Nocturia    Past Surgical History  Procedure Date  . Knee arthroscopy     bilateral  . Cholecystectomy   . Cardiac catheterization 12/10    LAD stent patent. Insignificant CAD, otherwise EF 60-65%  . Partial colectomy 11/2008    right, for adenocarcinoma  . Replacement total knee [id number]    right, complicated by  hemarthrosis   . Hematoma evacuation     after knee replacement  . Total knee revision 03/08/2012    Procedure: TOTAL KNEE REVISION;  Surgeon: Garald Balding, MD;  Location: [place];  Service: Orthopedics;  Laterality: Right;  removal total knee hardware and placement of antibiotic cement spacer and antibiotic beads  . Carpal tunnel release   . Total knee revision 05/17/2012    Procedure: TOTAL KNEE REVISION;  Surgeon: Garald Balding, MD;  Location: [place];  Service: Orthopedics;  Laterality: Right;  right total knee revision, removal of antibiotic spacer   Family History  Problem Relation Age of Onset  . Heart disease Mother     massive MI age 52  . Heart disease Father     Massive MI age 39   History   Social History  . Marital Status: Divorced    Spouse Name: N/A    Number of Children: 3  . Years of Education: N/A   Occupational History  . retired   .     Social History Main Topics  . Smoking status: Never Smoker   . Smokeless tobacco: Never Used  . Alcohol Use: No  . Drug Use: No  . Sexually Active: Not on file   Other Topics Concern  . Not on file   Social History Narrative   No regular  exercise, limited due to kneesDiet- addicted to sweets, some fruit and veggies, water daily.Has NO  living will or HCPOA   Allergies: Amoxicillin-pot clavulanate  Meds: Scheduled Meds:   . acetaminophen  1,000 mg Intravenous Q6H  . acetaminophen  1,000 mg Intravenous Q6H  . atorvastatin  20 mg Oral q1800  . calcium-vitamin D  1 tablet Oral BID  . carvedilol  12.5 mg Oral BID WC  . cholestyramine light  4 g Oral Q12H  . citalopram  40 mg Oral Daily  . diltiazem  240 mg Oral Daily  . docusate sodium  100 mg Oral BID  . doxycycline  100 mg Oral Q12H  . ferrous sulfate  325 mg Oral BID WC  . glyBURIDE  1.25 mg Oral BID WC   And  . metFORMIN  250 mg Oral BID WC  . hydrochlorothiazide  25 mg Oral Daily  . HYDROmorphone      . ketorolac      . multivitamin with minerals   1 tablet Oral Daily  . omega-3 acid ethyl esters  2 g Oral TID  . pantoprazole  40 mg Oral Q1200  . pneumococcal 23 valent vaccine  0.5 mL Intramuscular Tomorrow-1000  . potassium chloride SA  20 mEq Oral BID  . rivaroxaban  10 mg Oral Daily  . vancomycin  1,000 mg Intravenous Q12H  . DISCONTD: acetaminophen  1,000 mg Intravenous Q6H  . DISCONTD: cholestyramine light  4 g Oral BID  . DISCONTD: ferrous sulfate  325 mg Oral BID  . DISCONTD: glyBURIDE-metformin  1 tablet Oral BID WC   Continuous Infusions:   . sodium chloride    . DISCONTD: sodium chloride     PRN Meds:.alum & mag hydroxide-simeth, bisacodyl, HYDROmorphone (DILAUDID) injection, magnesium hydroxide, menthol-cetylpyridinium, methocarbamol (ROBAXIN) IV, methocarbamol, metoCLOPramide (REGLAN) injection, metoCLOPramide, ondansetron (ZOFRAN) IV, ondansetron, oxyCODONE, phenol, sodium phosphate  Review of Systems: All systems reviewed with the patient and positive as per history of present illness, otherwise all other systems are negative.  Physical Exam: Blood pressure 116/51, pulse 67, temperature 99.4 F (37.4 C), temperature source Oral, resp. rate 16, height 5\' 6"  (1.676 m), weight 75.796 kg (167 lb 1.6 oz), SpO2 97.00%. General: Awake, Oriented x3, No acute distress. HEENT: EOMI, Moist mucous membranes Neck: Supple CV: S1 and S2 Lungs: Clear to ascultation bilaterally Abdomen: Soft, Nontender, Nondistended, +bowel sounds. Ext: Good pulses. Trace edema.  Right knee in bandage, drain in place, blood and drainage vac. Neuro: Cranial Nerves II-XII grossly intact. Has 5/5 motor strength in upper and lower extremities.  Lab results:  Basename 05/18/12 0700  NA 139  K 3.2*  CL 107  CO2 25  GLUCOSE 134*  BUN 15  CREATININE 1.05  CALCIUM 6.8*  MG --  PHOS --   No results found for this basename: AST:2,ALT:2,ALKPHOS:2,BILITOT:2,PROT:2,ALBUMIN:2 in the last 72 hours No results found for this basename:  LIPASE:2,AMYLASE:2 in the last 72 hours  Basename 05/18/12 0700  WBC 8.4  NEUTROABS --  HGB 7.9*  HCT 23.1*  MCV 83.1  PLT 135*   No results found for this basename: CKTOTAL:3,CKMB:3,CKMBINDEX:3,TROPONINI:3 in the last 72 hours No components found with this basename: POCBNP:3 No results found for this basename: DDIMER in the last 72 hours No results found for this basename: HGBA1C:2 in the last 72 hours No results found for this basename: CHOL:2,HDL:2,LDLCALC:2,TRIG:2,CHOLHDL:2,LDLDIRECT:2 in the last 72 hours No results found for this basename: TSH,T4TOTAL,FREET3,T3FREE,THYROIDAB in the last 72 hours No results found for this basename:  VITAMINB12:2,FOLATE:2,FERRITIN:2,TIBC:2,IRON:2,RETICCTPCT:2 in the last 72 hours Imaging results:  No results found. Other results:  Assessment & Plan by Problem: Fever Likely due to transfusion reaction, as patient was febrile only during her transfusion.  No further fevers.  Continue to monitor. Send blood cultures x2.  Diabetes type 2 stable Continue glyburide.  Discontinue metformin, resume at discharge.  Sensitive sliding scale insulin.  Elevated blood pressure (during the transfusion)/hypertension Likely due to transfusion reaction.  Continue home antihypertensive medications.  Acute blood loss anemia Likely due to surgery.  Patient is status post 1 unit of pRBC.  Continue to monitor.  Prior to any blood transfusion consider pre-medicating with Tylenol and Benadryl.  GERD Stable.  Hyperlipidemia Stable.  Infected prosthetic joint Status post right total knee revision.  Management and antibiotics as per orthopedic (primary) service.  Anxiety Stable.  Prophylaxis Rivaroxaban, as per surgical guidelines.  Thank you for the consult.  Will continue to follow.  Time spent on Consult, talking to the patient, and coordinating care was: 45 mins.  Zakiah Gauthreaux A, MD 05/18/2012, 5:32 PM

## 2012-05-18 NOTE — Progress Notes (Signed)
Physical Therapy Treatment Patient Details Name: Tonya [name] MRN: [id number] DOB: [birth year] Today's Date: 05/18/2012 Time: [id number] PT Time Calculation (min): 22 min  PT Assessment / Plan / Recommendation Comments on Treatment Session  Pt limited participation due to low Hgb and reaction to blood transfusion. Placed pt in CPM 0-50 degrees.      Follow Up Recommendations  Home health PT;Supervision - Intermittent    Barriers to Discharge        Equipment Recommendations  None recommended by PT    Recommendations for Other Services    Frequency 7X/week   Plan Discharge plan remains appropriate;Frequency remains appropriate    Precautions / Restrictions Precautions Precautions: Knee Required Braces or Orthoses: Knee Immobilizer - Right Restrictions Weight Bearing Restrictions: Yes RLE Weight Bearing: Partial weight bearing RLE Partial Weight Bearing Percentage or Pounds: 50%   Pertinent Vitals/Pain Pt reports pain much better 2-3/10 R knee.     Mobility  Bed Mobility Bed Mobility: Not assessed Transfers Transfers: Not assessed Ambulation/Gait Ambulation/Gait Assistance: Not tested (comment) Wheelchair Mobility Wheelchair Mobility: No    Exercises Total Joint Exercises Ankle Circles/Pumps: AROM;Strengthening;10 reps;Sidelying Quad Sets: AAROM;10 reps Short Arc Quad: 5 reps;Right;Strengthening;AAROM Heel Slides: 5 reps;AAROM;Supine Straight Leg Raises: 5 reps;Right;Strengthening;AAROM;Supine Goniometric ROM: 0-60   PT Diagnosis:    PT Problem List:   PT Treatment Interventions:     PT Goals Acute Rehab PT Goals PT Goal Formulation: With patient Pt will Perform Home Exercise Program: with supervision, verbal cues required/provided PT Goal: Perform Home Exercise Program - Progress: Progressing toward goal  Visit Information  Last PT Received On: 05/18/12 Assistance Needed: +1    Subjective Data  Subjective: I feel better Patient Stated Goal: return to  home   Cognition  Overall Cognitive Status: Appears within functional limits for tasks assessed/performed Arousal/Alertness: Awake/alert Orientation Level: Appears intact for tasks assessed;Oriented X4 / Intact Behavior During Session: [facility] for tasks performed    Balance  Balance Balance Assessed: No  End of Session PT - End of Session Equipment Utilized During Treatment: Gait belt;Right knee immobilizer Activity Tolerance: Treatment limited secondary to medical complications (Comment) (pt had reation to blood transfusion) Patient left: in bed;with call bell/phone within reach;in CPM   GP     [name] 05/18/2012, 7:02 PM [name] L. [name] DPT [phone],

## 2012-05-18 NOTE — Progress Notes (Signed)
Pt no longer vomiting, color is back, Vitals stable. Normal Saline infusing at this time.

## 2012-05-18 NOTE — Op Note (Signed)
[name], [name] NO.:  [id number]  MEDICAL RECORD NO.:  [id number]  LOCATION:  [id number]                        FACILITY:  [facility]  PHYSICIAN:  Vonna Kotyk. Deerica Waszak, M.D.DATE OF BIRTH:  [birth year]  DATE OF PROCEDURE:  05/17/2012 DATE OF DISCHARGE:                              OPERATIVE REPORT   PREOPERATIVE DIAGNOSIS:  Infected left total knee replacement, status post irrigation and debridement, insertion of antibiotic spacer.  POSTOPERATIVE DIAGNOSIS:  Infected left total knee replacement, status post irrigation and debridement, insertion of antibiotic spacer.  PROCEDURE:  Revision arthroplasty, right knee.  SURGEON:  Vonna Kotyk. Durward Fortes, MD.  ASSISTANTAaron Edelman D. Petrarca, PA-C.  ANESTHESIA:  General with supplemental femoral nerve block.  COMPLICATIONS:  None.  COMPONENTS:  DePuy MBT, size 3 tibial tray with a 13 x 30 mm modular stem, a TC3, size 3 right femur with a 75 x 14 mm universal fluted stem, a 4-mm posterior augment laterally and an 8 mm augment medially and two 4 mm augments anteriorly, oval dome patella, a cement restrictor, and 2 Mitek anchors to secure a partially avulsed patellar tendon.  PROCEDURE:  Tonya [name] was met in the Holding Area and identified the right knee as appropriate operative site.  She was then transported to room number 7 and placed under general anesthesia without difficulty. She did receive a preoperative femoral nerve block.  A nursing staff inserted a Foley catheter, urine was clear.  Tourniquet was applied to the right thigh.  Leg was then prepped with Betadine scrub and DuraPrep, sterile draping was performed.  Time-out was called.  With the extremity elevated, Esmarch exsanguinated with the proximal tourniquet at 350 mmHg.  The prior longitudinal incision was elliptically excised, via sharp dissection that carried down to subcutaneous tissue.  Deep capsule was incised with a knife.  The joint was entered.   There was a clear-yellow joint effusions.  Aerobic and anaerobic cultures were sent.  The patella was then everted 180 degrees of its partial avulsion of the patellar tendon.  Knee was then flexed to 90 degrees.  Using the oscillating saw, the femoral antibiotic spacer was removed by amputating the stem on the polyethylene component, which was then removed and then I used the oscillating saw and the osteotomes to remove the antibiotic spacer from the tibia.  Wound was then irrigated with saline solution.  Reaming was performed of the proximal end of the tibia.  We measured a 13-stem.  We used a tibial spacer based on the size of the central stem. Reaming was performed so that we could apply a tibial tray that would set nicely on the tibia.  There were some areas of bone loss posteriorly and laterally and anteromedially.  These were subsequently bone grafted prior to insertion of methacrylate, but we had a very nice fit.  We checked our alignment with the external guides on several occasions.  Hand reaming was then performed in the femur to accept a 75-mm long stem that was 14-mm wide based on the reaming.  We then inserted the tibial jig to obtain the anterior-posterior cuts and the box cut.  There was a #3 femur.  We had an excellent  fit with augments anteriorly, 4-mm both medially and laterally and an 8-mm augment posteromedially and a 4-mm augment posterolaterally.  This construct was then reduced.  With the tibial tray in place, we measured a 17.5-mm tibial bearing.  The entire construct was then reduced.  We had a full range of motion with full extension, over 120 degrees of flexion, no instability.  The patella was then prepared by making a cleanup cut.  We drilled 3 holes for the tibial tray and applied the size 38 patellar button.  It did seem to sublux posteriorly, so lateral release was then performed.  The trial components were then removed.  The joint was then  copiously irrigated with saline solution.  We dropped the tourniquet at 108 minutes.  Gross bleeders were Bovie coagulated.  The knee was packed with sterile gauze.  The final components were then prepared on the side table with a #3 tibial tray with a 13 x 30 mm stem and I impacted bone graft from the femur into the defects in the tibia anteromedially and posterolaterally.  The wound was again irrigated.  After approximately 17 minutes, we elevated the leg and then Esmarch the leg with 350 mmHg.  Again the wound was then irrigated.  The final components were impacted with polymethyl methacrylate using 1 g of vancomycin in each of the 3 packs. We initially impacted the tibia and removed extraneous methacrylate from its periphery, the bone graft remained in place.  The femur was then impacted with polymethyl methacrylate and extraneous methacrylate was removed from its periphery.  We inserted the 17.5 mm polyethylene bearing and then reduced the entire knee and again we had full extension, a very nice position of the components, and any further extraneous methacrylate was removed.  Patella was applied with methacrylate and a patellar clamp.  Waiting for the methacrylate to mature, we irrigated the wound and then infiltrated the deep capsule with 0.25% Marcaine with epinephrine.  Two four-pronged Mitek anchors were inserted in the proximal tibia to reattach the partially avulsed patellar tendon.  At approximately 30 minutes, the second tourniquet was deflated and any gross bleeders were Bovie coagulated.  A medium-size Hemovac was inserted through the lateral capsule.  The patellar tendon was reattached with the Mitek anchor and a #2 Ethibond.  Deep capsules were then closed with #1 Ethibond interrupted sutures.  The subcu was closed in several layers with 0 and 2-0 Vicryl and 3-0 Monocryl.  Skin was closed with skin clips.  Hemovac was charged.  A sterile bulky dressing was applied,  followed by the patient's support stocking.  The patient tolerated the procedure well without complications.     Vonna Kotyk. Durward Fortes, M.D.    PWW/MEDQ  D:  05/17/2012  T:  05/18/2012  Job:  [id number]

## 2012-05-18 NOTE — Progress Notes (Signed)
OT Cancellation Note  Treatment cancelled today due to medical issues with patient which prohibited therapy. Low HGB  [facility], OTR/L  [id number] 05/18/2012 05/18/2012, 5:00 PM

## 2012-05-19 DIAGNOSIS — Z96659 Presence of unspecified artificial knee joint: Secondary | ICD-10-CM

## 2012-05-19 DIAGNOSIS — T8450XA Infection and inflammatory reaction due to unspecified internal joint prosthesis, initial encounter: Secondary | ICD-10-CM

## 2012-05-19 LAB — CBC
Hemoglobin: 8.3 g/dL — ABNORMAL LOW (ref 12.0–15.0)
MCH: 29 pg (ref 26.0–34.0)
MCHC: 34.9 g/dL (ref 30.0–36.0)
Platelets: 125 10*3/uL — ABNORMAL LOW (ref 150–400)
RDW: 15.1 % (ref 11.5–15.5)

## 2012-05-19 LAB — WOUND CULTURE

## 2012-05-19 LAB — BASIC METABOLIC PANEL
Calcium: 6.7 mg/dL — ABNORMAL LOW (ref 8.4–10.5)
GFR calc Af Amer: 59 mL/min — ABNORMAL LOW (ref >90)
GFR calc non Af Amer: 51 mL/min — ABNORMAL LOW (ref >90)
Potassium: 3.2 mEq/L — ABNORMAL LOW (ref 3.5–5.1)
Sodium: 141 mEq/L (ref 135–145)

## 2012-05-19 LAB — URINALYSIS, MICROSCOPIC ONLY
Hgb urine dipstick: NEGATIVE
Nitrite: NEGATIVE
Protein, ur: NEGATIVE mg/dL
Specific Gravity, Urine: 1.012 (ref 1.005–1.030)
Urobilinogen, UA: 0.2 mg/dL (ref 0.0–1.0)

## 2012-05-19 LAB — CARDIAC PANEL(CRET KIN+CKTOT+MB+TROPI)
Relative Index: 1 (ref 0.0–2.5)
Total CK: 279 U/L — ABNORMAL HIGH (ref 7–177)
Troponin I: <0.3 ng/mL (ref <0.30)

## 2012-05-19 LAB — GLUCOSE, CAPILLARY
Glucose-Capillary: 106 mg/dL — ABNORMAL HIGH (ref 70–99)
Glucose-Capillary: 114 mg/dL — ABNORMAL HIGH (ref 70–99)
Glucose-Capillary: 163 mg/dL — ABNORMAL HIGH (ref 70–99)
Glucose-Capillary: 165 mg/dL — ABNORMAL HIGH (ref 70–99)

## 2012-05-19 LAB — TRANSFUSION REACTION: Post RXN DAT IgG: NEGATIVE

## 2012-05-19 MED ORDER — CHOLESTYRAMINE LIGHT 4 G PO PACK
4.0000 g | PACK | Freq: Two times a day (BID) | ORAL | Status: DC
  Administered 2012-05-19 – 2012-05-20 (×2): 4 g via ORAL
  Filled 2012-05-19 (×4): qty 1

## 2012-05-19 MED ORDER — POTASSIUM CHLORIDE CRYS ER 20 MEQ PO TBCR
40.0000 meq | EXTENDED_RELEASE_TABLET | Freq: Once | ORAL | Status: AC
  Administered 2012-05-19: 40 meq via ORAL
  Filled 2012-05-19: qty 2

## 2012-05-19 MED ORDER — PNEUMOCOCCAL VAC POLYVALENT 25 MCG/0.5ML IJ INJ
0.5000 mL | INJECTION | INTRAMUSCULAR | Status: AC
  Administered 2012-05-20: 0.5 mL via INTRAMUSCULAR
  Filled 2012-05-19: qty 0.5

## 2012-05-19 NOTE — Progress Notes (Signed)
Occupational Therapy Evaluation Patient Details Name: Tonya [name] MRN: [id number] DOB: Sep 22, 1941 Today's Date: 05/19/2012 Time: [id number] OT Time Calculation (min): 21 min  OT Assessment / Plan / Recommendation Clinical Impression  Pt s/p Tonya TKA revision and had low Hgb 8/21 resulting in blood transfusion. Pt reports she feels much better today.  Able to demonstrate basic functional mobility at min guard level.  Pt familiar with ADL techniques and able to correctly verbalize safety awareness during ADLs and tub transfer with bench. No further acute OT needs.     OT Assessment  Patient does not need any further OT services    Follow Up Recommendations  No OT follow up;Supervision/Assistance - 24 hour    Barriers to Discharge      Equipment Recommendations  None recommended by OT    Recommendations for Other Services    Frequency       Precautions / Restrictions Precautions Precautions: Knee Restrictions Weight Bearing Restrictions: Yes RLE Weight Bearing: Partial weight bearing RLE Partial Weight Bearing Percentage or Pounds: 50%   Pertinent Vitals/Pain See vitals    ADL  Upper Body Bathing: Simulated;Independent Where Assessed - Upper Body Bathing: Unsupported sitting Lower Body Bathing: Simulated;Supervision/safety Where Assessed - Lower Body Bathing: Unsupported sitting Upper Body Dressing: Simulated;Independent Where Assessed - Upper Body Dressing: Unsupported sitting Lower Body Dressing: Simulated;Supervision/safety Where Assessed - Lower Body Dressing: Unsupported sitting Toilet Transfer: Simulated;Min guard Toilet Transfer Method: Sit to Loss adjuster, chartered: Other (comment) (chair) Equipment Used: Rolling walker;Gait belt Transfers/Ambulation Related to ADLs: min guard with RW ADL Comments: Pt verbalized correct LB dressing technique and safe tub transfer with bench (she has at home). Recommended pt continue to use tub transfer bench and have  daughter present during tub transfer.      OT Diagnosis:    OT Problem List:   OT Treatment Interventions:     OT Goals    Visit Information  Last OT Received On: 05/19/12 Assistance Needed: +1    Subjective Data      Prior Functioning  Vision/Perception  Home Living Lives With: Daughter;Family Available Help at Discharge: Family;Available 24 hours/day Type of Home: House Home Access: Stairs to enter [employer] of Steps: 1 Entrance Stairs-Rails: None Home Layout: One level Bathroom Shower/Tub: Product/process development scientist: Standard Bathroom Accessibility: Yes How Accessible: Accessible via walker Home Adaptive Equipment: Straight cane;Tub transfer bench;Walker - rolling;Wheelchair - manual (toilet riser) Prior Function Level of Independence: Independent with assistive device(s) Able to Take Stairs?: Yes Driving: No Vocation: Retired      Associate Professor  Overall Cognitive Status: Appears within functional limits for tasks assessed/performed Arousal/Alertness: Awake/alert Orientation Level: Appears intact for tasks assessed;Oriented X4 / Intact Behavior During Session: [facility] for tasks performed    Extremity/Trunk Assessment Right Upper Extremity Assessment RUE ROM/Strength/Tone: Within functional levels Left Upper Extremity Assessment LUE ROM/Strength/Tone: Within functional levels   Mobility Transfers Transfers: Sit to Stand;Stand to Sit Sit to Stand: 4: Min guard;From chair/3-in-1;With armrests;With upper extremity assist Stand to Sit: 4: Min guard;To chair/3-in-1;With armrests;With upper extremity assist Details for Transfer Assistance: Pt independently demonstrated safe hand placement.   Exercise    Balance    End of Session OT - End of Session Equipment Utilized During Treatment: Gait belt Activity Tolerance: Patient tolerated treatment well Patient left: in chair;with call bell/phone within reach  GO    05/19/2012 Darrol Jump OTR/L Pager [phone] Office [phone]  [name], [name] 05/19/2012, 12:48 PM

## 2012-05-19 NOTE — Progress Notes (Signed)
Patient ID: Tonya [name], female   DOB: Aug 29, 1941, 71 y.o.   MRN: [id number] PATIENT ID: Tonya [name]        MRN:  [id number]          DOB/AGE: 05-22-1941 / 71 y.o.  Tonya Fears, MD   Biagio Borg, PA-C [place], [place], [place]  [id number]                             [phone]   PROGRESS NOTE  Subjective:  negative for Chest Pain  negative for Shortness of Breath  negative for Nausea/Vomiting   negative for Calf Pain  positive for Bowel Movement   Tolerating Diet: yes         Patient reports pain as 3 on 0-10 scale.     C/o of some tingling in her neck after PT.  She states that it stopped but while changing her dressing it started again.  Objective: Vital signs in last 24 hours:   Patient Vitals for the past 24 hrs:  BP Temp Temp src Pulse Resp SpO2  05/19/12 0537 123/45 mmHg 98.2 F (36.8 C) - 74  17  94 %  05/19/12 0400 - - - - 16  96 %  05/19/12 0000 - - - - 16  96 %  05/18/12 2212 130/69 mmHg 99.1 F (37.3 C) - 80  - 94 %  05/18/12 2000 - - - - 18  97 %  05/18/12 1730 128/41 mmHg 99.9 F (37.7 C) Oral 85  18  97 %  05/18/12 1615 116/51 mmHg 99.4 F (37.4 C) Oral 67  16  -  05/18/12 1540 114/49 mmHg 100.6 F (38.1 C) Oral 90  16  -  05/18/12 1510 144/52 mmHg 99.9 F (37.7 C) Oral 100  16  -  05/18/12 1415 166/62 mmHg 102.4 F (39.1 C) Oral 89  16  -  05/18/12 1400 178/70 mmHg 102.7 F (39.3 C) Oral 88  16  -  05/18/12 1300 133/45 mmHg 98 F (36.7 C) Oral 80  16  -  05/18/12 1204 133/47 mmHg 98.3 F (36.8 C) Oral 79  16  -  05/18/12 1120 126/35 mmHg 98.6 F (37 C) Oral 99  18  -      Intake/Output from previous day:   08/21 0701 - 08/22 0700 In: 853.5 [P.O.:600; I.V.:10] Out: 178 [Drains:175]   Intake/Output this shift:   08/22 0701 - 08/22 1900 In: 240 [P.O.:240] Out: -    Intake/Output      08/21 0701 - 08/22 0700 08/22 0701 - 08/23 0700   P.O. 600 240   I.V. (mL/kg) 10 (0.1)    Blood 243.5    Total Intake(mL/kg) 853.5  (11.3) 240 (3.2)   Urine (mL/kg/hr)     Emesis/NG output 3    Drains 175    Blood     Total Output 178    Net +675.5 +240        Urine Occurrence 2 x 1 x   Stool Occurrence  1 x   Emesis Occurrence 6 x       LABORATORY DATA:  Basename 05/19/12 0555 05/18/12 0700  WBC 10.5 8.4  HGB 8.3* 7.9*  HCT 23.8* 23.1*  PLT 125* 135*    Basename 05/19/12 0555 05/18/12 0700  NA 141 139  K 3.2* 3.2*  CL 107 107  CO2 25 25  BUN 14 15  CREATININE 1.07 1.05  GLUCOSE 106* 134*  CALCIUM 6.7* 6.8*   Lab Results  Component Value Date   INR 0.90 05/11/2012   INR 1.04 03/02/2012   INR 1.78* 05/31/2010    Recent Radiographic Studies :  No results found.   Examination:  General appearance: alert, cooperative and mild distress Resp: clear to auscultation bilaterally Cardio: regular rate and rhythm GI: normal findings: bowel sounds normal  Wound Exam: clean, dry, intact   Drainage:  None: wound tissue dry  Motor Exam: EHL, FHL, Anterior Tibial and Posterior Tibial Intact  Sensory Exam: Superficial Peroneal, Deep Peroneal and Tibial normal  Vascular Exam: Right dorsalis pedis artery has 1+ (weak) pulse  Assessment:    2 Days Post-Op  Procedure(s) (LRB): TOTAL KNEE REVISION (Right)  ADDITIONAL DIAGNOSIS:  Active Problems:  DIABETES MELLITUS, TYPE II  HYPERLIPIDEMIA  ANXIETY  DEPRESSION  HYPERTENSION  CORONARY ARTERY DISEASE  GERD  Infected prosthetic knee joint  Fever  Acute Blood Loss Anemia from surgery and had fatigue and Hypokalemia  R/O cardiac event from recent neck Sx   Plan: Physical Therapy as ordered Touch Down Weight Bearing (TDWB) 50% WB   DVT Prophylaxis:  Xarelto, Foot Pumps and TED hose  DISCHARGE PLAN: Home  DISCHARGE NEEDS: HHPT, CPM, Walker and 3-in-1 comode seat  Stat EKG done reveals prolonged QT only Stat enzymes ordered Dr Reece Levy called and I have spoken to him.  He felt it was based on her anemia but will f/u EKG and enzymes         Doctor Sheahan 05/19/2012, 10:28 AM

## 2012-05-19 NOTE — Progress Notes (Signed)
PHYSICAL THERAPY PROGRESS NOTE  05/19/12 0900  PT Visit Information  Last PT Received On 05/19/12  Assistance Needed +1  PT Time Calculation  PT Start Time 0914  PT Stop Time 0936  PT Time Calculation (min) 22 min  Subjective Data  Subjective I feel better  Patient Stated Goal return to home  Precautions  Precautions Knee  Precaution Booklet Issued Yes (comment)  Required Braces or Orthoses Knee Immobilizer - Right  Restrictions  Weight Bearing Restrictions Yes  Cognition  Overall Cognitive Status Appears within functional limits for tasks assessed/performed  Arousal/Alertness Awake/alert  Orientation Level Appears intact for tasks assessed;Oriented X4 / Intact  Behavior During Session [facility] for tasks performed  Bed Mobility  Bed Mobility Supine to Sit  Supine to Sit 4: Min assist;HOB flat  Details for Bed Mobility Assistance Assist for R LE secondary to pain.    Transfers  Transfers Sit to Stand;Stand to Sit  Sit to Stand 4: Min assist  Stand to Sit 4: Min assist  Details for Transfer Assistance Cueing for hand placement and technique.   Ambulation/Gait  Ambulation/Gait Assistance 4: Min guard  Ambulation Distance (Feet) 100 Feet  Assistive device Rolling walker  Ambulation/Gait Assistance Details Cues for gait sequencing and PWB on R LE.    Gait Pattern Step-to pattern  Stairs No  Wheelchair Mobility  Wheelchair Mobility No  Balance  Balance Assessed No  Exercises  Exercises Total Joint  Total Joint Exercises  Ankle Circles/Pumps AROM;Strengthening;10 reps;Sidelying  Quad Sets AAROM;10 reps  Short Arc Quad 5 reps;Right;Strengthening;AAROM  PT - End of Session  Equipment Utilized During Treatment Gait belt;Right knee immobilizer  Activity Tolerance Treatment limited secondary to medical complications (Comment)  Patient left in chair;with call bell/phone within reach  Nurse Communication Mobility status;Weight bearing status  PT - Assessment/Plan  Comments on  Treatment Session Pt mobility much improved.  No c/o dizziness or nausea.    PT Plan Discharge plan remains appropriate;Frequency remains appropriate  PT Frequency 7X/week  Follow Up Recommendations Home health PT;Supervision - Intermittent  Equipment Recommended None recommended by PT  Acute Rehab PT Goals  PT Goal Formulation With patient  Time For Goal Achievement 05/25/12  Potential to Achieve Goals Good  Pt will go Supine/Side to Sit with supervision  PT Goal: Supine/Side to Sit - Progress Progressing toward goal  Pt will go Sit to Supine/Side with supervision  PT Goal: Sit to Supine/Side - Progress Progressing toward goal  Pt will go Sit to Stand with supervision  PT Goal: Sit to Stand - Progress Progressing toward goal  Pt will go Stand to Sit with supervision  PT Goal: Stand to Sit - Progress Progressing toward goal  Pt will Transfer Bed to Chair/Chair to Bed with supervision  PT Transfer Goal: Bed to Chair/Chair to Bed - Progress Progressing toward goal  Pt will Ambulate 51 - 150 feet;with supervision;with rolling walker  PT Goal: Ambulate - Progress Progressing toward goal  PT Treatments  $Gait Training 8-22 mins  $Therapeutic Activity 8-22 mins   Brentt Fread L. Summerlyn Fickel DPT [phone]

## 2012-05-19 NOTE — Progress Notes (Signed)
Referral received for SNF. Chart reviewed and CSW has spoken with RNCM who indicates that patient is for DC to home with Home Health and DME. Will return home with daughter. SNF placement is not indicated.  CSW to sign off. Please re-consult if CSW needs arise.  Lorie Phenix. [place], [place]

## 2012-05-19 NOTE — Progress Notes (Signed)
Subjective: Feeling better today. Had tingling in her neck when working with physical therapy.  Objective: Vital signs in last 24 hours: Filed Vitals:   05/18/12 2212 05/19/12 0000 05/19/12 0400 05/19/12 0537  BP: 130/69   123/45  Pulse: 80   74  Temp: 99.1 F (37.3 C)   98.2 F (36.8 C)  TempSrc:      Resp:  16 16 17   Height:      Weight:      SpO2: 94% 96% 96% 94%   Weight change:   Intake/Output Summary (Last 24 hours) at 05/19/12 0941 Last data filed at 05/19/12 0847  Gross per 24 hour  Intake 1093.5 ml  Output    178 ml  Net  915.5 ml    Physical Exam: General: Awake, Oriented, No acute distress. HEENT: EOMI. Neck: Supple CV: S1 and S2 Lungs: Clear to ascultation bilaterally Abdomen: Soft, Nontender, Nondistended, +bowel sounds. Ext: Good pulses. Trace edema.  Right leg in bandage, drain in place.  Lab Results: Basic Metabolic Panel:  Lab [id number] 05/18/12 0700  NA 141 139  K 3.2* 3.2*  CL 107 107  CO2 25 25  GLUCOSE 106* 134*  BUN 14 15  CREATININE 1.07 1.05  CALCIUM 6.7* 6.8*  MG -- --  PHOS -- --   Liver Function Tests: No results found for this basename: AST:5,ALT:5,ALKPHOS:5,BILITOT:5,PROT:5,ALBUMIN:5 in the last 168 hours No results found for this basename: LIPASE:5,AMYLASE:5 in the last 168 hours No results found for this basename: AMMONIA:5 in the last 168 hours CBC:  Lab 05/19/12 0555 05/18/12 0700  WBC 10.5 8.4  NEUTROABS -- --  HGB 8.3* 7.9*  HCT 23.8* 23.1*  MCV 83.2 83.1  PLT 125* 135*   Cardiac Enzymes: No results found for this basename: CKTOTAL:5,CKMB:5,CKMBINDEX:5,TROPONINI:5 in the last 168 hours BNP (last 3 results) No results found for this basename: PROBNP:3 in the last 8760 hours CBG:  Lab 05/19/12 0626 05/19/12 0055 05/17/12 1223 05/17/12 0655  GLUCAP 106* 163* 158* 147*   No results found for this basename: HGBA1C:5 in the last 72 hours Other Labs: No components found with this basename: POCBNP:3 No  results found for this basename: DDIMER:2 in the last 168 hours No results found for this basename: CHOL:2,HDL:2,LDLCALC:2,TRIG:2,CHOLHDL:2,LDLDIRECT:2 in the last 168 hours No results found for this basename: TSH,T4TOTAL,FREET3,T3FREE,FREET4,THYROIDAB in the last 168 hours No results found for this basename: VITAMINB12:2,FOLATE:2,FERRITIN:2,TIBC:2,IRON:2,RETICCTPCT:2 in the last 168 hours  Micro Results: Recent Results (from the past 240 hour(s))  SURGICAL PCR SCREEN     Status: Normal   Collection Time   05/11/12 11:58 AM      Component Value Range Status Comment   MRSA, PCR NEGATIVE  NEGATIVE Final    Staphylococcus aureus NEGATIVE  NEGATIVE Final   URINE CULTURE     Status: Normal   Collection Time   05/11/12 12:01 PM      Component Value Range Status Comment   Specimen Description URINE, CLEAN CATCH   Final    Special Requests NONE   Final    Culture  Setup Time 05/11/2012 13:00   Final    Colony Count 85,000 COLONIES/ML   Final    Culture     Final    Value: Multiple bacterial morphotypes present, none predominant. Suggest appropriate recollection if clinically indicated.   Report Status 05/12/2012 FINAL   Final   WOUND CULTURE     Status: Normal   Collection Time   05/17/12  9:21 AM  Component Value Range Status Comment   Specimen Description WOUND KNEE RIGHT   Final    Special Requests NONE   Final    Gram Stain     Final    Value: NO WBC SEEN     NO SQUAMOUS EPITHELIAL CELLS SEEN     NO ORGANISMS SEEN   Culture NO GROWTH 2 DAYS   Final    Report Status 05/19/2012 FINAL   Final   ANAEROBIC CULTURE     Status: Normal (Preliminary result)   Collection Time   05/17/12  9:21 AM      Component Value Range Status Comment   Specimen Description WOUND KNEE RIGHT   Final    Special Requests NONE   Final    Gram Stain     Final    Value: NO WBC SEEN     NO SQUAMOUS EPITHELIAL CELLS SEEN     NO ORGANISMS SEEN   Culture     Final    Value: NO ANAEROBES ISOLATED; CULTURE IN  PROGRESS FOR 5 DAYS   Report Status PENDING   Incomplete   TISSUE CULTURE     Status: Normal (Preliminary result)   Collection Time   05/17/12  9:23 AM      Component Value Range Status Comment   Specimen Description TISSUE RIGHT KNEE   Final    Special Requests SYNOVIUM TISSUE NO1   Final    Gram Stain     Final    Value: RARE WBC PRESENT,BOTH PMN AND MONONUCLEAR     FEW SQUAMOUS EPITHELIAL CELLS PRESENT     NO ORGANISMS SEEN   Culture NO GROWTH 2 DAYS   Final    Report Status PENDING   Incomplete   ANAEROBIC CULTURE     Status: Normal (Preliminary result)   Collection Time   05/17/12  9:23 AM      Component Value Range Status Comment   Specimen Description TISSUE RIGHT KNEE   Final    Special Requests SYNOVIUM TISSUE NO1   Final    Gram Stain     Final    Value: RARE WBC PRESENT,BOTH PMN AND MONONUCLEAR     NO ORGANISMS SEEN   Culture     Final    Value: NO ANAEROBES ISOLATED; CULTURE IN PROGRESS FOR 5 DAYS   Report Status PENDING   Incomplete   TISSUE CULTURE     Status: Normal (Preliminary result)   Collection Time   05/17/12  9:28 AM      Component Value Range Status Comment   Specimen Description TISSUE RIGHT KNEE   Final    Special Requests SYNOVIUM NO2    Final    Gram Stain     Final    Value: NO WBC SEEN     NO ORGANISMS SEEN   Culture NO GROWTH 2 DAYS   Final    Report Status PENDING   Incomplete   ANAEROBIC CULTURE     Status: Normal (Preliminary result)   Collection Time   05/17/12  9:28 AM      Component Value Range Status Comment   Specimen Description TISSUE RIGHT KNEE   Final    Special Requests SYNOVIUM NO2   Final    Gram Stain PENDING   Incomplete    Culture     Final    Value: NO ANAEROBES ISOLATED; CULTURE IN PROGRESS FOR 5 DAYS   Report Status PENDING   Incomplete   CULTURE, BLOOD (ROUTINE  X 2)     Status: Normal (Preliminary result)   Collection Time   05/18/12  5:30 PM      Component Value Range Status Comment   Specimen Description BLOOD RIGHT  ARM   Final    Special Requests BOTTLES DRAWN AEROBIC AND ANAEROBIC 10CC   Final    Culture  Setup Time 05/18/2012 23:38   Final    Culture     Final    Value:        BLOOD CULTURE RECEIVED NO GROWTH TO DATE CULTURE WILL BE HELD FOR 5 DAYS BEFORE ISSUING A FINAL NEGATIVE REPORT   Report Status PENDING   Incomplete   CULTURE, BLOOD (ROUTINE X 2)     Status: Normal (Preliminary result)   Collection Time   05/18/12  5:45 PM      Component Value Range Status Comment   Specimen Description BLOOD RIGHT ARM   Final    Special Requests BOTTLES DRAWN AEROBIC AND ANAEROBIC 10CC   Final    Culture  Setup Time 05/18/2012 23:38   Final    Culture     Final    Value:        BLOOD CULTURE RECEIVED NO GROWTH TO DATE CULTURE WILL BE HELD FOR 5 DAYS BEFORE ISSUING A FINAL NEGATIVE REPORT   Report Status PENDING   Incomplete     Studies/Results: No results found.  Medications: I have reviewed the patient's current medications. Scheduled Meds:   . acetaminophen  1,000 mg Intravenous Q6H  . acetaminophen  1,000 mg Intravenous Q6H  . atorvastatin  20 mg Oral q1800  . calcium-vitamin D  1 tablet Oral BID  . carvedilol  12.5 mg Oral BID WC  . cholestyramine light  4 g Oral BID  . citalopram  40 mg Oral Daily  . diltiazem  240 mg Oral Daily  . docusate sodium  100 mg Oral BID  . doxycycline  100 mg Oral Q12H  . ferrous sulfate  325 mg Oral BID WC  . glyBURIDE  1.25 mg Oral BID WC  . hydrochlorothiazide  25 mg Oral Daily  . insulin aspart  0-9 Units Subcutaneous TID WC  . multivitamin with minerals  1 tablet Oral Daily  . omega-3 acid ethyl esters  2 g Oral TID  . pantoprazole  40 mg Oral Q1200  . pneumococcal 23 valent vaccine  0.5 mL Intramuscular Tomorrow-1000  . potassium chloride SA  20 mEq Oral BID  . potassium chloride  40 mEq Oral Once  . rivaroxaban  10 mg Oral Daily  . vancomycin  1,000 mg Intravenous Q12H  . DISCONTD: cholestyramine light  4 g Oral Q12H  . DISCONTD: metFORMIN  250 mg  Oral BID WC   Continuous Infusions:   . sodium chloride 10 mL/hr at 05/18/12 1700   PRN Meds:.alum & mag hydroxide-simeth, bisacodyl, HYDROmorphone (DILAUDID) injection, magnesium hydroxide, menthol-cetylpyridinium, methocarbamol (ROBAXIN) IV, methocarbamol, metoCLOPramide (REGLAN) injection, metoCLOPramide, ondansetron (ZOFRAN) IV, ondansetron, oxyCODONE, phenol  Assessment/Plan: Fever  Likely due to transfusion reaction.  Afebrile since yesterday. Blood cultures x2 on 05/18/2012, showed no growth to date.  Acute blood loss anemia  Likely due to surgery. Status post 1 unit of pRBC on 05/18/2012. Continue to monitor. Prior to any blood transfusion consider pre-medicating with Tylenol and Benadryl.   Tingling in neck EKG reviewed, shows sinus rhythm, no indications of ischemia.  Suspect is likely due to anemia.  Resolved.  Diabetes type 2 stable  Continue glyburide. Discontinue  metformin, resume at discharge. Sensitive sliding scale insulin.   Elevated blood pressure (during the transfusion)/hypertension  Likely due to transfusion reaction. Continue home antihypertensive medications.   GERD  Stable.   Hyperlipidemia  Stable.   Infected prosthetic joint  Status post right total knee revision. Management and antibiotics as per orthopedic (primary) service.   Anxiety  Stable.   Prophylaxis  Rivaroxaban, as per surgical guidelines.  Will continue to follow.   LOS: 2 days  Clance Baquero A, MD 05/19/2012, 9:41 AM

## 2012-05-19 NOTE — Progress Notes (Signed)
Physical Therapy Treatment Patient Details Name: Tonya [name] MRN: [id number] DOB: Feb 19, 1941 Today's Date: 05/19/2012 Time: [id number] PT Time Calculation (min): 16 min  PT Assessment / Plan / Recommendation Comments on Treatment Session  Placed pt in CPM after session. Pt has made very good progress and should be appropriate to d/c to home tomorrow.      Follow Up Recommendations  Home health PT;Supervision - Intermittent    Barriers to Discharge        Equipment Recommendations  None recommended by PT    Recommendations for Other Services    Frequency 7X/week   Plan Discharge plan remains appropriate;Frequency remains appropriate    Precautions / Restrictions Precautions Precautions: Knee Required Braces or Orthoses: Knee Immobilizer - Right Restrictions Weight Bearing Restrictions: Yes RLE Weight Bearing: Partial weight bearing RLE Partial Weight Bearing Percentage or Pounds: 50%   Pertinent Vitals/Pain Pt reports pain in knee 2-3/10 no pain relief required.     Mobility  Bed Mobility Bed Mobility: Supine to Sit;Sit to Supine Supine to Sit: 5: Supervision;HOB flat Sit to Supine: 5: Supervision;HOB flat Details for Bed Mobility Assistance: No assistance or instruction required pt using L LE to assist R LE.   Transfers Transfers: Sit to Stand;Stand to Sit Sit to Stand: 5: Supervision;From bed Stand to Sit: 5: Supervision;To bed Details for Transfer Assistance: Pt demonstrates safe technique.  Ambulation/Gait Ambulation/Gait Assistance: 5: Supervision Ambulation Distance (Feet): 150 Feet Assistive device: Rolling walker Gait Pattern: Step-to pattern General Gait Details: Pt demonstrates PWB without prompting.  Stairs: No Wheelchair Mobility Wheelchair Mobility: No    Exercises Total Joint Exercises Straight Leg Raises: 5 reps;Right;Strengthening;Supine;AROM   PT Diagnosis:    PT Problem List:   PT Treatment Interventions:     PT Goals Acute Rehab PT  Goals PT Goal Formulation: With patient Time For Goal Achievement: 05/25/12 Potential to Achieve Goals: Good Pt will go Supine/Side to Sit: with supervision PT Goal: Supine/Side to Sit - Progress: Met Pt will go Sit to Supine/Side: with supervision PT Goal: Sit to Supine/Side - Progress: Met Pt will go Sit to Stand: with supervision PT Goal: Sit to Stand - Progress: Met Pt will go Stand to Sit: with supervision PT Goal: Stand to Sit - Progress: Met Pt will Transfer Bed to Chair/Chair to Bed: with supervision Pt will Ambulate: 51 - 150 feet;with supervision;with rolling walker PT Goal: Ambulate - Progress: Met Pt will Go Up / Down Stairs: 1-2 stairs;with supervision;with rolling walker PT Goal: Up/Down Stairs - Progress: Not met Pt will Perform Home Exercise Program: with supervision, verbal cues required/provided PT Goal: Perform Home Exercise Program - Progress: Progressing toward goal  Visit Information  Last PT Received On: 05/19/12 Assistance Needed: +1    Subjective Data  Subjective: I feel better Patient Stated Goal: return to home   Cognition  Overall Cognitive Status: Appears within functional limits for tasks assessed/performed Arousal/Alertness: Awake/alert Orientation Level: Appears intact for tasks assessed;Oriented X4 / Intact Behavior During Session: [facility] for tasks performed    Balance  Balance Balance Assessed: No  End of Session PT - End of Session Equipment Utilized During Treatment: Gait belt;Right knee immobilizer Activity Tolerance: Treatment limited secondary to medical complications (Comment) Patient left: in bed;with call bell/phone within reach;in CPM Nurse Communication: Mobility status;Weight bearing status   GP     Estiben Mizuno 05/19/2012, 4:49 PM Jonna Dittrich L. Teriyah Purington DPT [phone]

## 2012-05-20 LAB — CBC
HCT: 22.5 % — ABNORMAL LOW (ref 36.0–46.0)
MCH: 29.3 pg (ref 26.0–34.0)
MCV: 83.3 fL (ref 78.0–100.0)
Platelets: 127 10*3/uL — ABNORMAL LOW (ref 150–400)
RBC: 2.7 MIL/uL — ABNORMAL LOW (ref 3.87–5.11)
WBC: 9.3 10*3/uL (ref 4.0–10.5)

## 2012-05-20 LAB — BASIC METABOLIC PANEL
BUN: 11 mg/dL (ref 6–23)
CO2: 25 mEq/L (ref 19–32)
Calcium: 6.7 mg/dL — ABNORMAL LOW (ref 8.4–10.5)
Chloride: 105 mEq/L (ref 96–112)
Creatinine, Ser: 0.96 mg/dL (ref 0.50–1.10)

## 2012-05-20 LAB — TISSUE CULTURE
Culture: NO GROWTH
Culture: NO GROWTH

## 2012-05-20 MED ORDER — MAGNESIUM SULFATE IN D5W 10-5 MG/ML-% IV SOLN
1.0000 g | Freq: Once | INTRAVENOUS | Status: DC
  Filled 2012-05-20: qty 100

## 2012-05-20 MED ORDER — POTASSIUM CHLORIDE CRYS ER 20 MEQ PO TBCR
40.0000 meq | EXTENDED_RELEASE_TABLET | Freq: Once | ORAL | Status: AC
  Administered 2012-05-20: 40 meq via ORAL

## 2012-05-20 MED ORDER — MAGNESIUM OXIDE 400 (241.3 MG) MG PO TABS
400.0000 mg | ORAL_TABLET | Freq: Two times a day (BID) | ORAL | Status: DC
  Administered 2012-05-20: 400 mg via ORAL
  Filled 2012-05-20 (×2): qty 1

## 2012-05-20 MED ORDER — RIVAROXABAN 10 MG PO TABS: 10.0000 mg | ORAL_TABLET | Freq: Every day | ORAL | Status: DC

## 2012-05-20 MED ORDER — DOXYCYCLINE HYCLATE 100 MG PO TABS: 100.0000 mg | ORAL_TABLET | Freq: Two times a day (BID) | ORAL | Status: AC

## 2012-05-20 MED ORDER — OXYCODONE HCL 5 MG PO TABS: 5.0000 mg | ORAL_TABLET | ORAL | Status: AC | PRN

## 2012-05-20 MED ORDER — MAGNESIUM SULFATE 40 MG/ML IJ SOLN
2.0000 g | Freq: Once | INTRAMUSCULAR | Status: AC
  Administered 2012-05-20: 2 g via INTRAVENOUS
  Filled 2012-05-20: qty 50

## 2012-05-20 MED ORDER — MAGNESIUM OXIDE 400 (241.3 MG) MG PO TABS: 400.0000 mg | ORAL_TABLET | Freq: Two times a day (BID) | ORAL | Status: DC

## 2012-05-20 MED ORDER — METHOCARBAMOL 500 MG PO TABS: 500.0000 mg | ORAL_TABLET | Freq: Three times a day (TID) | ORAL | Status: DC | PRN

## 2012-05-20 NOTE — Progress Notes (Signed)
Physical Therapy Treatment Patient Details Name: Tonya [name] MRN: [id number] DOB: 1941-03-09 Today's Date: 05/20/2012 Time: 1000-1027 PT Time Calculation (min): 27 min  PT Assessment / Plan / Recommendation Comments on Treatment Session  Will review HEP with pt next session    Follow Up Recommendations  Home health PT;Supervision - Intermittent    Barriers to Discharge        Equipment Recommendations  None recommended by PT    Recommendations for Other Services    Frequency 7X/week   Plan Discharge plan remains appropriate;Frequency remains appropriate    Precautions / Restrictions Precautions Precautions: Knee Precaution Booklet Issued: Yes (comment) Required Braces or Orthoses: Knee Immobilizer - Right Restrictions Weight Bearing Restrictions: Yes RLE Weight Bearing: Partial weight bearing RLE Partial Weight Bearing Percentage or Pounds: 50%   Pertinent Vitals/Pain No c/o pain.      Mobility  Bed Mobility Bed Mobility: Not assessed Transfers Transfers: Sit to Stand;Stand to Sit Sit to Stand: 6: Modified independent (Device/Increase time) Stand to Sit: 6: Modified independent (Device/Increase time) Ambulation/Gait Ambulation/Gait Assistance: 6: Modified independent (Device/Increase time) Ambulation Distance (Feet): 150 Feet Assistive device: Rolling walker Gait Pattern: Step-to pattern General Gait Details: Pt demonstrates PWB without prompting.  Stairs: Yes Stairs Assistance: 6: Modified independent (Device/Increase time) Stair Management Technique: With walker Number of Stairs: 1     Exercises Total Joint Exercises Heel Slides: 5 reps;AAROM;Supine   PT Diagnosis:    PT Problem List:   PT Treatment Interventions:     PT Goals Acute Rehab PT Goals PT Goal Formulation: With patient Time For Goal Achievement: 05/25/12 Potential to Achieve Goals: Good Pt will go Supine/Side to Sit: with modified independence PT Goal: Supine/Side to Sit - Progress:  Met Pt will go Sit to Supine/Side: with modified independence PT Goal: Sit to Supine/Side - Progress: Met Pt will go Sit to Stand: with modified independence PT Goal: Sit to Stand - Progress: Met Pt will go Stand to Sit: with modified independence PT Goal: Stand to Sit - Progress: Met Pt will Transfer Bed to Chair/Chair to Bed: with modified independence PT Transfer Goal: Bed to Chair/Chair to Bed - Progress: Met Pt will Ambulate: >150 feet;with modified independence PT Goal: Ambulate - Progress: Met Pt will Go Up / Down Stairs: 1-2 stairs;with supervision;with rolling walker PT Goal: Up/Down Stairs - Progress: Met Pt will Perform Home Exercise Program: with supervision, verbal cues required/provided PT Goal: Perform Home Exercise Program - Progress: Progressing toward goal  Visit Information  Last PT Received On: 05/20/12 Assistance Needed: +1    Subjective Data  Subjective: I am going home today Patient Stated Goal: return to home   Cognition  Overall Cognitive Status: Appears within functional limits for tasks assessed/performed Arousal/Alertness: Awake/alert Orientation Level: Appears intact for tasks assessed;Oriented X4 / Intact Behavior During Session: [facility] for tasks performed    Balance     End of Session PT - End of Session Equipment Utilized During Treatment: Gait belt;Right knee immobilizer Activity Tolerance: Treatment limited secondary to medical complications (Comment) Patient left: in chair;with call bell/phone within reach Nurse Communication: Mobility status;Weight bearing status   GP     Laylee Schooley 05/20/2012, 12:06 PM Trevan Messman L. Tashona Calk DPT [phone]

## 2012-05-20 NOTE — Discharge Summary (Signed)
[name], MD   Biagio Borg, PA-C [place], [place], [place]  [address]                             [phone]  PATIENT ID: Tonya [name]        MRN:  [id number]          DOB/AGE: 1940/10/31 / 71 y.o.    DISCHARGE SUMMARY  ADMISSION DATE:    05/17/2012 DISCHARGE DATE:   05/20/2012   ADMISSION DIAGNOSIS: failed total knee/loosening  Right knee   DISCHARGE DIAGNOSIS:  failed total knee/loosening, s/p I&D antibiotic spacer  Right knee  ADDITIONAL DIAGNOSIS: Active Problems:  DIABETES MELLITUS, TYPE II  HYPERLIPIDEMIA  ANXIETY  DEPRESSION  HYPERTENSION  CORONARY ARTERY DISEASE  GERD  Infected prosthetic knee joint  Fever  Past Medical History  Diagnosis Date  . Osteoarthritis   . Edema, peripheral   . CAD (coronary artery disease)     s/p BMS to LAD  . Breast pain     left  . Hyperlipidemia   . Hypertension   . Sinusitis     acute- NOS  . Allergic rhinitis   . Blood in stool   . Adenocarcinoma, colon   . Anemia   . Sciatica   . Cystitis, acute   . Chronic diarrhea   . Nausea   . GERD (gastroesophageal reflux disease)   . Depression   . Anxiety   . Osteoarthritis   . IBS (irritable bowel syndrome)   . Degenerative disk disease   . Carpal tunnel syndrome   . Diabetes mellitus, type 2   . Family history of diabetes mellitus   . History of pneumonia   . Urinary frequency   . Nocturia     PROCEDURE:Right TOTAL KNEE REVISION on 05/17/2012  CONSULTS: [facility]     HISTORY: Tonya [name] is a very pleasant 71 year old white female who is seen today for evaluation of her right knee. History is that in August of 2011 she started having problems with a painful right total knee arthroplasty which had been implanted on April 29, 2010. Postoperatively she had a problem with an intra-articular hematoma and was taken to the operating room on the 30th of August 2011 and had exploration of the right total knee with irrigation and debridement and  poly-exchange and evacuation of a hematoma. She had done well until March 30, 2011 and at that time she did not have an effusion and just a bit of a drawer but continued to do well overall. She returned on December 14, 2011. She had been developing about 1-1/2 week of pain in her right knee without any history of injury or trauma. She denied any fever, shakes or chills at that time. She states she was just out with a friend one day and experienced pain with intermittent swelling and it had worsened. It actually got to the point where she was having constant moderate pain and was using hydrocodone and oxycodone for pain. She had been noted to have some ectopic calcification at the [facility] and the patellar tendon and quadriceps mechanism. There was some refraction underneath the tibial component medially and laterally and possibly some at the femoral component. We obtained laboratory studies at that time that revealed a white count of 11,400, sedimentation rate of 55, C-reactive protein 1.54, cell count of 17,000 that of 90% neutrophils and 10 monocytes and macrophages. There were no crystals seen.  She did have a bone scan which did reveal an abnormal uptake on all 3 phases of the bone scan within the right knee involving the femoral condyles and tibial plateau and consistent with either joint infection or prosthetic loosening.  With this in mind, it was felt that she would be a candidate for exploration of the right knee and this occurred on the 11th of June 2013. At that time all components were removed and insertion of an antibiotic spacer. Since that time she has had IV antibiotics and presently is also continuing on doxycycline. She had completed the 6 week course of IV antibiotics through the Infectious Disease Department at [facility]. Aspiration of the knee on April 29, 2012 revealed 1,775 white cells, 84 neutrophils, 16 monocytes/macrophages. There were intracellular calcium pyrophosphate crystals. Glucose was 105 and  protein was 40. It was felt that we may consider removal of the antibiotic components and a revision total knee arthroplasty if her cultures were negative and they have been negative.   HOSPITAL COURSE:  Tonya [name] is a 71 y.o. admitted on 05/17/2012 and found to have a diagnosis of failed total knee/loosening, s/p I&D antibiotic spacer.  After appropriate laboratory studies were obtained  they were taken to the operating room on 05/17/2012 and underwent Right Procedure(s): TOTAL KNEE REVISION.   They were given perioperative antibiotics:  Anti-infectives     Start     Dose/Rate Route Frequency Ordered Stop   05/20/12 0000   doxycycline (VIBRA-TABS) 100 MG tablet        100 mg Oral Every 12 hours 05/20/12 0811 05/30/12 2359   05/18/12 1000   doxycycline (VIBRA-TABS) tablet 100 mg        100 mg Oral Every 12 hours 05/18/12 0731     05/17/12 2200   vancomycin (VANCOCIN) IVPB 1000 mg/200 mL premix        1,000 mg 200 mL/hr over 60 Minutes Intravenous Every 12 hours 05/17/12 1554 05/19/12 0959   05/17/12 1107   vancomycin (VANCOCIN) powder  Status:  Discontinued          As needed 05/17/12 1107 05/17/12 1215   05/17/12 1106   vancomycin (VANCOCIN) powder  Status:  Discontinued          As needed 05/17/12 1106 05/17/12 1215   05/17/12 1106   vancomycin (VANCOCIN) powder  Status:  Discontinued          As needed 05/17/12 1106 05/17/12 1215        .  Tolerated the procedure well.  Placed with a foley intraoperatively.  Given Ofirmev at induction and for 48 hours.    POD #1, allowed out of bed to a chair.  PT for ambulation and exercise program.  Foley D/C'd in morning.  IV saline lcontinued.  O2 discontionued.  Given 1 unit of PRBC's of which she had a reaction to at the last 50 mls.    POD #2, continued PT and ambulation.  Hemovac pulled.  Had 2 episodes of "tingling in her neck" and EKG and cardiac enzymes ordered.  EKG revealed prolonged QT.  Enzymes normal.  Hospitalist felt it  was more anxiety.  This resolved. Marland Kitchen POD #3, continued PT and stairs.  Requested discharge.  Pain controlled  The remainder of the hospital course was dedicated to ambulation and strengthening.   The patient was discharged on 3 Days Post-Op in  Stable condition.  Blood products given:1 unit CC PRBC  DIAGNOSTIC STUDIES: Recent vital signs:  Patient Vitals for the past 24 hrs:  BP Temp Temp src Pulse Resp SpO2  05/20/12 0542 121/56 mmHg 98.7 F (37.1 C) Oral 81  18  97 %  05/20/12 0400 - - - - 18  97 %  05/20/12 0000 - - - - 18  99 %  05-20-2012 2234 129/54 mmHg 99.2 F (37.3 C) Oral 72  18  99 %  May 20, 2012 2000 - - - - 16  99 %  2012-05-20 1352 122/42 mmHg 98.4 F (36.9 C) - 72  16  99 %       Recent laboratory studies:  Basename 05/20/12 0625 May 20, 2012 0555 05/18/12 0700  WBC 9.3 10.5 8.4  HGB 7.9* 8.3* 7.9*  HCT 22.5* 23.8* 23.1*  PLT 127* 125* 135*    Basename 05/20/12 0625 May 20, 2012 0555 05/18/12 0700  NA 140 141 139  K 3.1* 3.2* 3.2*  CL 105 107 107  CO2 25 25 25   BUN 11 14 15   CREATININE 0.96 1.07 1.05  GLUCOSE 118* 106* 134*  CALCIUM 6.7* 6.7* 6.8*   Lab Results  Component Value Date   INR 0.90 05/11/2012   INR 1.04 03/02/2012   INR 1.78* 05/31/2010     Recent Radiographic Studies :  No results found.  DISCHARGE INSTRUCTIONS: Discharge Orders    Future Orders Please Complete By Expires   Diet general      Call MD / Call 911      Comments:   If you experience chest pain or shortness of breath, CALL 911 and be transported to the hospital emergency room.  If you develope a fever above 101 F, pus (white drainage) or increased drainage or redness at the wound, or calf pain, call your surgeon's office.   Constipation Prevention      Comments:   Drink plenty of fluids.  Prune juice may be helpful.  You may use a stool softener, such as Colace (over the counter) 100 mg twice a day.  Use MiraLax (over the counter) for constipation as needed.   Increase activity slowly as  tolerated      Patient may shower      Comments:   You may shower without a dressing once there is no drainage.  Do not wash over the wound.  If drainage remains, cover wound with plastic wrap and then shower.   Partial weight bearing      Comments:   50 % WEIGHT BEARING AS TAUGHT IN PHYSICAL THERAPY   Driving restrictions      Comments:   No driving for 6 weeks   Lifting restrictions      Comments:   No lifting for 6 weeks   CPM      Comments:   Continuous passive motion machine (CPM):      Use the CPM from 0 to 60 for 6-8 hours per day.      You may increase by 5 degrees per day.  You may break it up into 2 or 3 sessions per day.      Use CPM for 4 weeks or until you are told to stop.   TED hose      Comments:   Use stockings (TED hose) for 3 weeks on operative leg(s).  You may remove them at night for sleeping.   Change dressing      Comments:   Change dressing on Sunday, then change the dressing daily with sterile 4 x 4 inch gauze dressing and apply TED hose.  You may clean the incision with alcohol prior to redressing.   Do not put a pillow under the knee. Place it under the heel.         DISCHARGE MEDICATIONS:   Medication List  As of 05/20/2012  8:22 AM   STOP taking these medications         acetaminophen 325 MG tablet      aspirin 81 MG chewable tablet      nabumetone 750 MG tablet      traMADol 50 MG tablet         TAKE these medications         atorvastatin 20 MG tablet   Commonly known as: LIPITOR   Take 20 mg by mouth daily.      calcium-vitamin D 500-200 MG-UNIT per tablet   Commonly known as: OSCAL WITH D   Take 1 tablet by mouth 2 (two) times daily.      carvedilol 12.5 MG tablet   Commonly known as: COREG   Take 12.5 mg by mouth 2 (two) times daily with a meal.      cholestyramine light 4 G packet   Commonly known as: PREVALITE   Take 4 g by mouth 2 (two) times daily.      citalopram 40 MG tablet   Commonly known as: CELEXA   Take 40 mg  by mouth daily.      diltiazem 240 MG 24 hr capsule   Commonly known as: DILACOR XR   Take 240 mg by mouth daily.      doxycycline 100 MG tablet   Commonly known as: VIBRA-TABS   Take 1 tablet (100 mg total) by mouth every 12 (twelve) hours.      ferrous sulfate 325 (65 FE) MG EC tablet   Take 325 mg by mouth 2 (two) times daily.      glyBURIDE-metformin 1.25-250 MG per tablet   Commonly known as: GLUCOVANCE   Take 1 tablet by mouth 2 (two) times daily with a meal.      hydrochlorothiazide 25 MG tablet   Commonly known as: HYDRODIURIL   Take 25 mg by mouth daily.      methocarbamol 500 MG tablet   Commonly known as: ROBAXIN   Take 1 tablet (500 mg total) by mouth every 8 (eight) hours as needed (spasms).      multivitamin with minerals Tabs   Take 1 tablet by mouth daily.      omega-3 acid ethyl esters 1 G capsule   Commonly known as: LOVAZA   Take 2 g by mouth 3 (three) times daily.      omeprazole 20 MG capsule   Commonly known as: PRILOSEC   Take 20 mg by mouth 2 (two) times daily.      oxyCODONE 5 MG immediate release tablet   Commonly known as: Oxy IR/ROXICODONE   Take 1-2 tablets (5-10 mg total) by mouth every 4 (four) hours as needed.      potassium chloride SA 20 MEQ tablet   Commonly known as: K-DUR,KLOR-CON   Take 20 mEq by mouth 2 (two) times daily.      rivaroxaban 10 MG Tabs tablet   Commonly known as: XARELTO   Take 1 tablet (10 mg total) by mouth daily.            FOLLOW UP VISIT:   Follow-up Information    Follow up with [facility] on 06/01/2012.   Contact information:   [address]  [place]           DISPOSITION:  Home    CONDITION:  Stable   Tonya [name] 05/20/2012, 8:22 AM

## 2012-05-20 NOTE — Progress Notes (Addendum)
Subjective: No specific concerns. Eager to be discharged today.  Objective: Vital signs in last 24 hours: Filed Vitals:   05/19/12 2234 05/20/12 0000 05/20/12 0400 05/20/12 0542  BP: 129/54   121/56  Pulse: 72   81  Temp: 99.2 F (37.3 C)   98.7 F (37.1 C)  TempSrc: Oral   Oral  Resp: 18 18 18 18   Height:      Weight:      SpO2: 99% 99% 97% 97%   Weight change:   Intake/Output Summary (Last 24 hours) at 05/20/12 1029 Last data filed at 05/20/12 0840  Gross per 24 hour  Intake    760 ml  Output      0 ml  Net    760 ml    Physical Exam: General: Awake, Oriented, No acute distress. HEENT: EOMI. Neck: Supple CV: S1 and S2 Lungs: Clear to ascultation bilaterally Abdomen: Soft, Nontender, Nondistended, +bowel sounds. Ext: Good pulses. Trace edema.  Right leg in bandage.  Lab Results: Basic Metabolic Panel:  Lab [id number] 0625 05/19/12 0555 05/18/12 0700  NA 140 141 139  K 3.1* 3.2* 3.2*  CL 105 107 107  CO2 25 25 25   GLUCOSE 118* 106* 134*  BUN 11 14 15   CREATININE 0.96 1.07 1.05  CALCIUM 6.7* 6.7* 6.8*  MG -- -- --  PHOS -- -- --   Liver Function Tests: No results found for this basename: AST:5,ALT:5,ALKPHOS:5,BILITOT:5,PROT:5,ALBUMIN:5 in the last 168 hours No results found for this basename: LIPASE:5,AMYLASE:5 in the last 168 hours No results found for this basename: AMMONIA:5 in the last 168 hours CBC:  Lab 05/20/12 0625 05/19/12 0555 05/18/12 0700  WBC 9.3 10.5 8.4  NEUTROABS -- -- --  HGB 7.9* 8.3* 7.9*  HCT 22.5* 23.8* 23.1*  MCV 83.3 83.2 83.1  PLT 127* 125* 135*   Cardiac Enzymes:  Lab 05/19/12 1045  CKTOTAL 279*  CKMB 2.7  CKMBINDEX --  TROPONINI <0.30   BNP (last 3 results) No results found for this basename: PROBNP:3 in the last 8760 hours CBG:  Lab 05/20/12 0643 05/19/12 2124 05/19/12 1632 05/19/12 1101 05/19/12 0626  GLUCAP 128* 158* 114* 165* 106*   No results found for this basename: HGBA1C:5 in the last 72 hours Other  Labs: No components found with this basename: POCBNP:3 No results found for this basename: DDIMER:2 in the last 168 hours No results found for this basename: CHOL:2,HDL:2,LDLCALC:2,TRIG:2,CHOLHDL:2,LDLDIRECT:2 in the last 168 hours No results found for this basename: TSH,T4TOTAL,FREET3,T3FREE,FREET4,THYROIDAB in the last 168 hours No results found for this basename: VITAMINB12:2,FOLATE:2,FERRITIN:2,TIBC:2,IRON:2,RETICCTPCT:2 in the last 168 hours  Micro Results: Recent Results (from the past 240 hour(s))  SURGICAL PCR SCREEN     Status: Normal   Collection Time   05/11/12 11:58 AM      Component Value Range Status Comment   MRSA, PCR NEGATIVE  NEGATIVE Final    Staphylococcus aureus NEGATIVE  NEGATIVE Final   URINE CULTURE     Status: Normal   Collection Time   05/11/12 12:01 PM      Component Value Range Status Comment   Specimen Description URINE, CLEAN CATCH   Final    Special Requests NONE   Final    Culture  Setup Time 05/11/2012 13:00   Final    Colony Count 85,000 COLONIES/ML   Final    Culture     Final    Value: Multiple bacterial morphotypes present, none predominant. Suggest appropriate recollection if clinically indicated.   Report Status  05/12/2012 FINAL   Final   WOUND CULTURE     Status: Normal   Collection Time   05/17/12  9:21 AM      Component Value Range Status Comment   Specimen Description WOUND KNEE RIGHT   Final    Special Requests NONE   Final    Gram Stain     Final    Value: NO WBC SEEN     NO SQUAMOUS EPITHELIAL CELLS SEEN     NO ORGANISMS SEEN   Culture NO GROWTH 2 DAYS   Final    Report Status 05/19/2012 FINAL   Final   ANAEROBIC CULTURE     Status: Normal (Preliminary result)   Collection Time   05/17/12  9:21 AM      Component Value Range Status Comment   Specimen Description WOUND KNEE RIGHT   Final    Special Requests NONE   Final    Gram Stain     Final    Value: NO WBC SEEN     NO SQUAMOUS EPITHELIAL CELLS SEEN     NO ORGANISMS SEEN    Culture     Final    Value: NO ANAEROBES ISOLATED; CULTURE IN PROGRESS FOR 5 DAYS   Report Status PENDING   Incomplete   TISSUE CULTURE     Status: Normal   Collection Time   05/17/12  9:23 AM      Component Value Range Status Comment   Specimen Description TISSUE RIGHT KNEE   Final    Special Requests SYNOVIUM TISSUE NO1   Final    Gram Stain     Final    Value: RARE WBC PRESENT,BOTH PMN AND MONONUCLEAR     FEW SQUAMOUS EPITHELIAL CELLS PRESENT     NO ORGANISMS SEEN   Culture NO GROWTH 3 DAYS   Final    Report Status 05/20/2012 FINAL   Final   ANAEROBIC CULTURE     Status: Normal (Preliminary result)   Collection Time   05/17/12  9:23 AM      Component Value Range Status Comment   Specimen Description TISSUE RIGHT KNEE   Final    Special Requests SYNOVIUM TISSUE NO1   Final    Gram Stain     Final    Value: RARE WBC PRESENT,BOTH PMN AND MONONUCLEAR     NO ORGANISMS SEEN   Culture     Final    Value: NO ANAEROBES ISOLATED; CULTURE IN PROGRESS FOR 5 DAYS   Report Status PENDING   Incomplete   TISSUE CULTURE     Status: Normal   Collection Time   05/17/12  9:28 AM      Component Value Range Status Comment   Specimen Description TISSUE RIGHT KNEE   Final    Special Requests SYNOVIUM NO2    Final    Gram Stain     Final    Value: NO WBC SEEN     NO ORGANISMS SEEN   Culture NO GROWTH 3 DAYS   Final    Report Status 05/20/2012 FINAL   Final   ANAEROBIC CULTURE     Status: Normal (Preliminary result)   Collection Time   05/17/12  9:28 AM      Component Value Range Status Comment   Specimen Description TISSUE RIGHT KNEE   Final    Special Requests SYNOVIUM NO2   Final    Gram Stain PENDING   Incomplete    Culture  Final    Value: NO ANAEROBES ISOLATED; CULTURE IN PROGRESS FOR 5 DAYS   Report Status PENDING   Incomplete   CULTURE, BLOOD (ROUTINE X 2)     Status: Normal (Preliminary result)   Collection Time   05/18/12  5:30 PM      Component Value Range Status Comment    Specimen Description BLOOD RIGHT ARM   Final    Special Requests BOTTLES DRAWN AEROBIC AND ANAEROBIC 10CC   Final    Culture  Setup Time 05/18/2012 23:38   Final    Culture     Final    Value:        BLOOD CULTURE RECEIVED NO GROWTH TO DATE CULTURE WILL BE HELD FOR 5 DAYS BEFORE ISSUING A FINAL NEGATIVE REPORT   Report Status PENDING   Incomplete   CULTURE, BLOOD (ROUTINE X 2)     Status: Normal (Preliminary result)   Collection Time   05/18/12  5:45 PM      Component Value Range Status Comment   Specimen Description BLOOD RIGHT ARM   Final    Special Requests BOTTLES DRAWN AEROBIC AND ANAEROBIC 10CC   Final    Culture  Setup Time 05/18/2012 23:38   Final    Culture     Final    Value:        BLOOD CULTURE RECEIVED NO GROWTH TO DATE CULTURE WILL BE HELD FOR 5 DAYS BEFORE ISSUING A FINAL NEGATIVE REPORT   Report Status PENDING   Incomplete     Studies/Results: No results found.  Medications: I have reviewed the patient's current medications. Scheduled Meds:    . acetaminophen  1,000 mg Intravenous Q6H  . atorvastatin  20 mg Oral q1800  . calcium-vitamin D  1 tablet Oral BID  . carvedilol  12.5 mg Oral BID WC  . cholestyramine light  4 g Oral BID  . citalopram  40 mg Oral Daily  . diltiazem  240 mg Oral Daily  . docusate sodium  100 mg Oral BID  . doxycycline  100 mg Oral Q12H  . ferrous sulfate  325 mg Oral BID WC  . glyBURIDE  1.25 mg Oral BID WC  . hydrochlorothiazide  25 mg Oral Daily  . insulin aspart  0-9 Units Subcutaneous TID WC  . multivitamin with minerals  1 tablet Oral Daily  . omega-3 acid ethyl esters  2 g Oral TID  . pantoprazole  40 mg Oral Q1200  . pneumococcal 23 valent vaccine  0.5 mL Intramuscular Tomorrow-1000  . potassium chloride SA  20 mEq Oral BID  . potassium chloride  40 mEq Oral Once  . potassium chloride  40 mEq Oral Once  . rivaroxaban  10 mg Oral Daily  . DISCONTD: pneumococcal 23 valent vaccine  0.5 mL Intramuscular Tomorrow-1000    Continuous Infusions:  PRN Meds:.alum & mag hydroxide-simeth, bisacodyl, HYDROmorphone (DILAUDID) injection, magnesium hydroxide, menthol-cetylpyridinium, methocarbamol (ROBAXIN) IV, methocarbamol, metoCLOPramide (REGLAN) injection, metoCLOPramide, ondansetron (ZOFRAN) IV, ondansetron, oxyCODONE, phenol  Assessment/Plan: Fever  Likely due to transfusion reaction.  Afebrile since yesterday. Blood cultures x2 on 05/18/2012, showed no growth to date.  Acute blood loss anemia  Likely due to surgery. Status post 1 unit of pRBC on 05/18/2012. Prior to any blood transfusion consider pre-medicating with Tylenol and Benadryl. Hemoglobin stable.  Tingling in neck Likely due to anemia. No further episodes.   Diabetes type 2 stable  Continue glyburide. Resume metformin at discharge. Sensitive sliding scale insulin.   Elevated  blood pressure (during the transfusion)/hypertension  Likely due to transfusion reaction, resolved. Blood pressure stable. Continue home antihypertensive medications.   GERD  Stable.   Hyperlipidemia  Stable.   Infected prosthetic joint  Status post right total knee revision. Management and antibiotics as per orthopedic (primary) service.   Anxiety  Stable.   Prophylaxis  Rivaroxaban, as per surgical guidelines.  Will signoff please call with any questions or concerns.   LOS: 3 days  Neylan Koroma A, MD 05/20/2012, 10:29 AM  Addendum: Patient's magnesium came back at 0.9.  Will give the patient 1 g of magnesium sulfate IV, start magnesium oxide 400 mg twice daily.  Zenith Lamphier A, MD 05/20/2012, 12:11 PM

## 2012-05-20 NOTE — Progress Notes (Signed)
Tonya ID: Tonya [name], female   DOB: 01-Nov-1940, 71 y.o.   MRN: [id number] Tonya [name]        MRN:  [id number]          DOB/AGE: 1940-11-12 / 71 y.o.  Tonya Fears, MD   Biagio Borg, PA-C [address], [place], [place]  [address]                             [phone]   PROGRESS NOTE  Subjective:  negative for Chest Pain  negative for Shortness of Breath  negative for Nausea/Vomiting   negative for Calf Pain  positive for Bowel Movement   Tolerating Diet: yes         Tonya reports pain as mild.       Objective: Vital signs in last 24 hours:   Tonya Vitals for the past 24 hrs:  BP Temp Temp src Pulse Resp SpO2  05/20/12 0542 121/56 mmHg 98.7 F (37.1 C) Oral 81  18  97 %  05/20/12 0400 - - - - 18  97 %  05/20/12 0000 - - - - 18  99 %  05/19/12 2234 129/54 mmHg 99.2 F (37.3 C) Oral 72  18  99 %  05/19/12 2000 - - - - 16  99 %  05/19/12 1352 122/42 mmHg 98.4 F (36.9 C) - 72  16  99 %      Intake/Output from previous day:   08/22 0701 - 08/23 0700 In: 760 [P.O.:760] Out: -    Intake/Output this shift:       Intake/Output      08/22 0701 - 08/23 0700 08/23 0701 - 08/24 0700   P.O. 760    I.V. (mL/kg)     Blood     Total Intake(mL/kg) 760 (10)    Emesis/NG output     Drains     Total Output     Net +760         Urine Occurrence 4 x    Stool Occurrence 2 x       LABORATORY DATA:  Basename 05/20/12 0625 05/19/12 0555 05/18/12 0700  WBC 9.3 10.5 8.4  HGB 7.9* 8.3* 7.9*  HCT 22.5* 23.8* 23.1*  PLT 127* 125* 135*    Basename 05/20/12 0625 05/19/12 0555 05/18/12 0700  NA 140 141 139  K 3.1* 3.2* 3.2*  CL 105 107 107  CO2 25 25 25   BUN 11 14 15   CREATININE 0.96 1.07 1.05  GLUCOSE 118* 106* 134*  CALCIUM 6.7* 6.7* 6.8*   Lab Results  Component Value Date   INR 0.90 05/11/2012   INR 1.04 03/02/2012   INR 1.78* 05/31/2010    Recent Radiographic Studies :  No results found.   Examination:  General appearance:  alert, cooperative and no distress  Wound Exam: clean, dry, intact   Drainage:  None: wound tissue dry  Motor Exam: EHL, FHL, Anterior Tibial and Posterior Tibial Intact  Sensory Exam: Superficial Peroneal, Deep Peroneal and Tibial normal  Vascular Exam: Normal  Assessment:    3 Days Post-Op  Procedure(s) (LRB): TOTAL KNEE REVISION (Right)  ADDITIONAL DIAGNOSIS:  Active Problems:  DIABETES MELLITUS, TYPE II  HYPERLIPIDEMIA  ANXIETY  DEPRESSION  HYPERTENSION  CORONARY ARTERY DISEASE  GERD  Infected prosthetic knee joint  Fever  Acute Blood Loss Anemia and Hypokalemia   Plan: Physical Therapy as ordered Partial Weight Bearing @  50% (PWB)  DVT Prophylaxis:  Xarelto  DISCHARGE PLAN: Home  DISCHARGE NEEDS: has equipment  Wishes to go home.Good effort in PT, wound clean, dry.ABLA without symptoms this am-will D/C       Zacariah Belue W 05/20/2012, 7:56 AM

## 2012-05-20 NOTE — Progress Notes (Signed)
Physical Therapy Treatment Patient Details Name: Tonya [name] MRN: [id number] DOB: July 02, 1941 Today's Date: 05/20/2012 Time: [id number] PT Time Calculation (min): 17 min  PT Assessment / Plan / Recommendation Comments on Treatment Session  Pt has met all acute PT goals    Follow Up Recommendations  Home health PT;Supervision - Intermittent    Barriers to Discharge        Equipment Recommendations  None recommended by PT    Recommendations for Other Services    Frequency     Plan All goals met and education completed, patient dischaged from PT services    Precautions / Restrictions Precautions Precautions: Knee Precaution Booklet Issued: Yes (comment) Required Braces or Orthoses: Knee Immobilizer - Right Restrictions Weight Bearing Restrictions: Yes RLE Weight Bearing: Partial weight bearing RLE Partial Weight Bearing Percentage or Pounds: 50%   Pertinent Vitals/Pain No c/o pain    Mobility  Bed Mobility Bed Mobility: Not assessed Transfers Transfers: Not assessed Ambulation/Gait Ambulation/Gait Assistance: Not tested (comment) Wheelchair Mobility Wheelchair Mobility: No    Exercises Total Joint Exercises Ankle Circles/Pumps: AROM;Strengthening;10 reps;Sidelying Quad Sets: AAROM;10 reps Short Arc Quad: 5 reps;Right;Strengthening;AAROM Heel Slides: 5 reps;AAROM;Supine Straight Leg Raises: 5 reps;Right;Strengthening;Supine;AROM Knee Flexion: 5 reps;Right;Seated   PT Diagnosis:    PT Problem List:   PT Treatment Interventions:     PT Goals Acute Rehab PT Goals PT Goal Formulation: With patient Time For Goal Achievement: 05/25/12 Pt will Perform Home Exercise Program: with supervision, verbal cues required/provided PT Goal: Perform Home Exercise Program - Progress: Met  Visit Information  Last PT Received On: 05/20/12 Assistance Needed: +1    Subjective Data  Subjective: I am going home today Patient Stated Goal: return to home   Cognition  Overall  Cognitive Status: Appears within functional limits for tasks assessed/performed Arousal/Alertness: Awake/alert Orientation Level: Appears intact for tasks assessed;Oriented X4 / Intact Behavior During Session: [facility] for tasks performed    Balance  Balance Balance Assessed: No  End of Session PT - End of Session Activity Tolerance: Patient tolerated treatment well Patient left: in chair;with call bell/phone within reach Nurse Communication: Mobility status;Weight bearing status   GP     Keymani Glynn 05/20/2012, 3:51 PM Damean Poffenberger L. Eugenia Eldredge DPT [phone]

## 2012-05-21 DIAGNOSIS — T847XXA Infection and inflammatory reaction due to other internal orthopedic prosthetic devices, implants and grafts, initial encounter: Secondary | ICD-10-CM | POA: Diagnosis not present

## 2012-05-21 DIAGNOSIS — E119 Type 2 diabetes mellitus without complications: Secondary | ICD-10-CM | POA: Diagnosis not present

## 2012-05-21 DIAGNOSIS — Z4789 Encounter for other orthopedic aftercare: Secondary | ICD-10-CM | POA: Diagnosis not present

## 2012-05-21 DIAGNOSIS — Z96659 Presence of unspecified artificial knee joint: Secondary | ICD-10-CM | POA: Diagnosis not present

## 2012-05-21 DIAGNOSIS — I10 Essential (primary) hypertension: Secondary | ICD-10-CM | POA: Diagnosis not present

## 2012-05-21 DIAGNOSIS — IMO0001 Reserved for inherently not codable concepts without codable children: Secondary | ICD-10-CM | POA: Diagnosis not present

## 2012-05-21 LAB — TYPE AND SCREEN
ABO/RH(D): A POS
Antibody Screen: NEGATIVE
Unit division: 0

## 2012-05-22 DIAGNOSIS — Z4789 Encounter for other orthopedic aftercare: Secondary | ICD-10-CM | POA: Diagnosis not present

## 2012-05-22 DIAGNOSIS — IMO0001 Reserved for inherently not codable concepts without codable children: Secondary | ICD-10-CM | POA: Diagnosis not present

## 2012-05-22 DIAGNOSIS — I10 Essential (primary) hypertension: Secondary | ICD-10-CM | POA: Diagnosis not present

## 2012-05-22 DIAGNOSIS — E119 Type 2 diabetes mellitus without complications: Secondary | ICD-10-CM | POA: Diagnosis not present

## 2012-05-22 DIAGNOSIS — Z96659 Presence of unspecified artificial knee joint: Secondary | ICD-10-CM | POA: Diagnosis not present

## 2012-05-22 LAB — ANAEROBIC CULTURE
Gram Stain: NONE SEEN
Gram Stain: NONE SEEN

## 2012-05-23 DIAGNOSIS — E119 Type 2 diabetes mellitus without complications: Secondary | ICD-10-CM | POA: Diagnosis not present

## 2012-05-23 DIAGNOSIS — Z96659 Presence of unspecified artificial knee joint: Secondary | ICD-10-CM | POA: Diagnosis not present

## 2012-05-23 DIAGNOSIS — I10 Essential (primary) hypertension: Secondary | ICD-10-CM | POA: Diagnosis not present

## 2012-05-23 DIAGNOSIS — IMO0001 Reserved for inherently not codable concepts without codable children: Secondary | ICD-10-CM | POA: Diagnosis not present

## 2012-05-23 DIAGNOSIS — Z4789 Encounter for other orthopedic aftercare: Secondary | ICD-10-CM | POA: Diagnosis not present

## 2012-05-23 LAB — GLUCOSE, CAPILLARY: Glucose-Capillary: 136 mg/dL — ABNORMAL HIGH (ref 70–99)

## 2012-05-24 DIAGNOSIS — I10 Essential (primary) hypertension: Secondary | ICD-10-CM | POA: Diagnosis not present

## 2012-05-24 DIAGNOSIS — E119 Type 2 diabetes mellitus without complications: Secondary | ICD-10-CM | POA: Diagnosis not present

## 2012-05-24 DIAGNOSIS — Z96659 Presence of unspecified artificial knee joint: Secondary | ICD-10-CM | POA: Diagnosis not present

## 2012-05-24 DIAGNOSIS — IMO0001 Reserved for inherently not codable concepts without codable children: Secondary | ICD-10-CM | POA: Diagnosis not present

## 2012-05-24 DIAGNOSIS — Z4789 Encounter for other orthopedic aftercare: Secondary | ICD-10-CM | POA: Diagnosis not present

## 2012-05-24 LAB — CULTURE, BLOOD (ROUTINE X 2): Culture: NO GROWTH

## 2012-05-25 ENCOUNTER — Other Ambulatory Visit: Payer: Self-pay

## 2012-05-25 ENCOUNTER — Other Ambulatory Visit: Payer: Self-pay | Admitting: *Deleted

## 2012-05-25 DIAGNOSIS — E119 Type 2 diabetes mellitus without complications: Secondary | ICD-10-CM | POA: Diagnosis not present

## 2012-05-25 DIAGNOSIS — Z96659 Presence of unspecified artificial knee joint: Secondary | ICD-10-CM | POA: Diagnosis not present

## 2012-05-25 DIAGNOSIS — I10 Essential (primary) hypertension: Secondary | ICD-10-CM | POA: Diagnosis not present

## 2012-05-25 DIAGNOSIS — IMO0001 Reserved for inherently not codable concepts without codable children: Secondary | ICD-10-CM | POA: Diagnosis not present

## 2012-05-25 DIAGNOSIS — Z4789 Encounter for other orthopedic aftercare: Secondary | ICD-10-CM | POA: Diagnosis not present

## 2012-05-25 MED ORDER — CARVEDILOL 12.5 MG PO TABS: 12.5000 mg | ORAL_TABLET | Freq: Two times a day (BID) | ORAL | Status: DC

## 2012-05-25 MED ORDER — POTASSIUM CHLORIDE CRYS ER 20 MEQ PO TBCR: 20.0000 meq | EXTENDED_RELEASE_TABLET | Freq: Two times a day (BID) | ORAL | Status: DC

## 2012-05-25 NOTE — Telephone Encounter (Signed)
Pt [facility]. I explained to pt need specific names of med to be refilled. [facility]. Pt voiced understanding.

## 2012-05-26 DIAGNOSIS — Z96659 Presence of unspecified artificial knee joint: Secondary | ICD-10-CM | POA: Diagnosis not present

## 2012-05-26 DIAGNOSIS — IMO0001 Reserved for inherently not codable concepts without codable children: Secondary | ICD-10-CM | POA: Diagnosis not present

## 2012-05-26 DIAGNOSIS — I10 Essential (primary) hypertension: Secondary | ICD-10-CM | POA: Diagnosis not present

## 2012-05-26 DIAGNOSIS — E119 Type 2 diabetes mellitus without complications: Secondary | ICD-10-CM | POA: Diagnosis not present

## 2012-05-26 DIAGNOSIS — Z4789 Encounter for other orthopedic aftercare: Secondary | ICD-10-CM | POA: Diagnosis not present

## 2012-05-27 DIAGNOSIS — Z4789 Encounter for other orthopedic aftercare: Secondary | ICD-10-CM | POA: Diagnosis not present

## 2012-05-27 DIAGNOSIS — IMO0001 Reserved for inherently not codable concepts without codable children: Secondary | ICD-10-CM | POA: Diagnosis not present

## 2012-05-27 DIAGNOSIS — Z96659 Presence of unspecified artificial knee joint: Secondary | ICD-10-CM | POA: Diagnosis not present

## 2012-05-27 DIAGNOSIS — I10 Essential (primary) hypertension: Secondary | ICD-10-CM | POA: Diagnosis not present

## 2012-05-27 DIAGNOSIS — E119 Type 2 diabetes mellitus without complications: Secondary | ICD-10-CM | POA: Diagnosis not present

## 2012-05-30 DIAGNOSIS — Z4789 Encounter for other orthopedic aftercare: Secondary | ICD-10-CM | POA: Diagnosis not present

## 2012-05-30 DIAGNOSIS — E119 Type 2 diabetes mellitus without complications: Secondary | ICD-10-CM | POA: Diagnosis not present

## 2012-05-30 DIAGNOSIS — IMO0001 Reserved for inherently not codable concepts without codable children: Secondary | ICD-10-CM | POA: Diagnosis not present

## 2012-05-30 DIAGNOSIS — Z96659 Presence of unspecified artificial knee joint: Secondary | ICD-10-CM | POA: Diagnosis not present

## 2012-05-30 DIAGNOSIS — I10 Essential (primary) hypertension: Secondary | ICD-10-CM | POA: Diagnosis not present

## 2012-05-31 DIAGNOSIS — IMO0001 Reserved for inherently not codable concepts without codable children: Secondary | ICD-10-CM | POA: Diagnosis not present

## 2012-05-31 DIAGNOSIS — E119 Type 2 diabetes mellitus without complications: Secondary | ICD-10-CM | POA: Diagnosis not present

## 2012-05-31 DIAGNOSIS — Z96659 Presence of unspecified artificial knee joint: Secondary | ICD-10-CM | POA: Diagnosis not present

## 2012-05-31 DIAGNOSIS — I10 Essential (primary) hypertension: Secondary | ICD-10-CM | POA: Diagnosis not present

## 2012-05-31 DIAGNOSIS — Z4789 Encounter for other orthopedic aftercare: Secondary | ICD-10-CM | POA: Diagnosis not present

## 2012-06-01 DIAGNOSIS — Z96659 Presence of unspecified artificial knee joint: Secondary | ICD-10-CM | POA: Diagnosis not present

## 2012-06-03 DIAGNOSIS — I10 Essential (primary) hypertension: Secondary | ICD-10-CM | POA: Diagnosis not present

## 2012-06-03 DIAGNOSIS — Z96659 Presence of unspecified artificial knee joint: Secondary | ICD-10-CM | POA: Diagnosis not present

## 2012-06-03 DIAGNOSIS — E119 Type 2 diabetes mellitus without complications: Secondary | ICD-10-CM | POA: Diagnosis not present

## 2012-06-03 DIAGNOSIS — Z4789 Encounter for other orthopedic aftercare: Secondary | ICD-10-CM | POA: Diagnosis not present

## 2012-06-03 DIAGNOSIS — IMO0001 Reserved for inherently not codable concepts without codable children: Secondary | ICD-10-CM | POA: Diagnosis not present

## 2012-06-05 ENCOUNTER — Other Ambulatory Visit: Payer: Self-pay | Admitting: Gastroenterology

## 2012-06-06 DIAGNOSIS — Z96659 Presence of unspecified artificial knee joint: Secondary | ICD-10-CM | POA: Diagnosis not present

## 2012-06-06 DIAGNOSIS — IMO0001 Reserved for inherently not codable concepts without codable children: Secondary | ICD-10-CM | POA: Diagnosis not present

## 2012-06-06 DIAGNOSIS — E119 Type 2 diabetes mellitus without complications: Secondary | ICD-10-CM | POA: Diagnosis not present

## 2012-06-06 DIAGNOSIS — Z4789 Encounter for other orthopedic aftercare: Secondary | ICD-10-CM | POA: Diagnosis not present

## 2012-06-06 DIAGNOSIS — I10 Essential (primary) hypertension: Secondary | ICD-10-CM | POA: Diagnosis not present

## 2012-06-08 DIAGNOSIS — E119 Type 2 diabetes mellitus without complications: Secondary | ICD-10-CM | POA: Diagnosis not present

## 2012-06-08 DIAGNOSIS — Z4789 Encounter for other orthopedic aftercare: Secondary | ICD-10-CM | POA: Diagnosis not present

## 2012-06-08 DIAGNOSIS — IMO0001 Reserved for inherently not codable concepts without codable children: Secondary | ICD-10-CM | POA: Diagnosis not present

## 2012-06-08 DIAGNOSIS — Z96659 Presence of unspecified artificial knee joint: Secondary | ICD-10-CM | POA: Diagnosis not present

## 2012-06-08 DIAGNOSIS — I10 Essential (primary) hypertension: Secondary | ICD-10-CM | POA: Diagnosis not present

## 2012-06-10 DIAGNOSIS — Z96659 Presence of unspecified artificial knee joint: Secondary | ICD-10-CM | POA: Diagnosis not present

## 2012-06-10 DIAGNOSIS — IMO0001 Reserved for inherently not codable concepts without codable children: Secondary | ICD-10-CM | POA: Diagnosis not present

## 2012-06-10 DIAGNOSIS — E119 Type 2 diabetes mellitus without complications: Secondary | ICD-10-CM | POA: Diagnosis not present

## 2012-06-10 DIAGNOSIS — Z4789 Encounter for other orthopedic aftercare: Secondary | ICD-10-CM | POA: Diagnosis not present

## 2012-06-10 DIAGNOSIS — I10 Essential (primary) hypertension: Secondary | ICD-10-CM | POA: Diagnosis not present

## 2012-06-13 DIAGNOSIS — I10 Essential (primary) hypertension: Secondary | ICD-10-CM | POA: Diagnosis not present

## 2012-06-13 DIAGNOSIS — Z96659 Presence of unspecified artificial knee joint: Secondary | ICD-10-CM | POA: Diagnosis not present

## 2012-06-13 DIAGNOSIS — IMO0001 Reserved for inherently not codable concepts without codable children: Secondary | ICD-10-CM | POA: Diagnosis not present

## 2012-06-13 DIAGNOSIS — E119 Type 2 diabetes mellitus without complications: Secondary | ICD-10-CM | POA: Diagnosis not present

## 2012-06-13 DIAGNOSIS — Z4789 Encounter for other orthopedic aftercare: Secondary | ICD-10-CM | POA: Diagnosis not present

## 2012-06-15 DIAGNOSIS — E119 Type 2 diabetes mellitus without complications: Secondary | ICD-10-CM | POA: Diagnosis not present

## 2012-06-15 DIAGNOSIS — Z4789 Encounter for other orthopedic aftercare: Secondary | ICD-10-CM | POA: Diagnosis not present

## 2012-06-15 DIAGNOSIS — I10 Essential (primary) hypertension: Secondary | ICD-10-CM | POA: Diagnosis not present

## 2012-06-15 DIAGNOSIS — Z96659 Presence of unspecified artificial knee joint: Secondary | ICD-10-CM | POA: Diagnosis not present

## 2012-06-15 DIAGNOSIS — IMO0001 Reserved for inherently not codable concepts without codable children: Secondary | ICD-10-CM | POA: Diagnosis not present

## 2012-06-17 DIAGNOSIS — I10 Essential (primary) hypertension: Secondary | ICD-10-CM | POA: Diagnosis not present

## 2012-06-17 DIAGNOSIS — E119 Type 2 diabetes mellitus without complications: Secondary | ICD-10-CM | POA: Diagnosis not present

## 2012-06-17 DIAGNOSIS — Z96659 Presence of unspecified artificial knee joint: Secondary | ICD-10-CM | POA: Diagnosis not present

## 2012-06-17 DIAGNOSIS — Z4789 Encounter for other orthopedic aftercare: Secondary | ICD-10-CM | POA: Diagnosis not present

## 2012-06-17 DIAGNOSIS — IMO0001 Reserved for inherently not codable concepts without codable children: Secondary | ICD-10-CM | POA: Diagnosis not present

## 2012-06-21 ENCOUNTER — Other Ambulatory Visit: Payer: Self-pay

## 2012-06-21 ENCOUNTER — Other Ambulatory Visit: Payer: Self-pay | Admitting: Family Medicine

## 2012-06-21 DIAGNOSIS — M25569 Pain in unspecified knee: Secondary | ICD-10-CM | POA: Diagnosis not present

## 2012-06-21 MED ORDER — GLYBURIDE-METFORMIN 1.25-250 MG PO TABS: 1.0000 | ORAL_TABLET | Freq: Two times a day (BID) | ORAL | Status: DC

## 2012-06-21 NOTE — Telephone Encounter (Signed)
Pt left v/m requesting refill [facility]ide-metformin to [facility]. Pt will be out of med Thur. When pt last seen 01/29/12 pt was to schedule f/u to re eval DM. Pts said was not told and wants verified with Dr Diona Browner when needs to be rechecked.Please advise.

## 2012-06-21 NOTE — Telephone Encounter (Signed)
Return for re-eval DM in 11 or 12/ 2013 (6 month follow up)

## 2012-06-22 ENCOUNTER — Other Ambulatory Visit: Payer: Self-pay

## 2012-06-22 MED ORDER — MAGNESIUM OXIDE 400 (241.3 MG) MG PO TABS: 400.0000 mg | ORAL_TABLET | Freq: Two times a day (BID) | ORAL | Status: DC

## 2012-06-22 NOTE — Telephone Encounter (Signed)
Ok to refill 

## 2012-06-22 NOTE — Telephone Encounter (Signed)
Rx gliberzide metformin 1.25-250 mg #60 5R [facility]

## 2012-06-22 NOTE — Telephone Encounter (Signed)
Refill request for magnesium oxide 400 mg. Ok to refill?

## 2012-06-23 NOTE — Telephone Encounter (Signed)
Magnesium oxide 400 mg #60 0 R [facility]

## 2012-06-24 DIAGNOSIS — M25569 Pain in unspecified knee: Secondary | ICD-10-CM | POA: Diagnosis not present

## 2012-06-28 DIAGNOSIS — M25569 Pain in unspecified knee: Secondary | ICD-10-CM | POA: Diagnosis not present

## 2012-07-07 DIAGNOSIS — Z23 Encounter for immunization: Secondary | ICD-10-CM | POA: Diagnosis not present

## 2012-08-09 ENCOUNTER — Other Ambulatory Visit: Payer: Self-pay | Admitting: Family Medicine

## 2012-08-09 DIAGNOSIS — Z1231 Encounter for screening mammogram for malignant neoplasm of breast: Secondary | ICD-10-CM

## 2012-08-14 ENCOUNTER — Other Ambulatory Visit: Payer: Self-pay | Admitting: Gastroenterology

## 2012-08-17 ENCOUNTER — Telehealth: Payer: Self-pay | Admitting: Cardiology

## 2012-08-17 DIAGNOSIS — M1710 Unilateral primary osteoarthritis, unspecified knee: Secondary | ICD-10-CM | POA: Diagnosis not present

## 2012-08-17 DIAGNOSIS — Z96659 Presence of unspecified artificial knee joint: Secondary | ICD-10-CM | POA: Diagnosis not present

## 2012-08-17 NOTE — Telephone Encounter (Signed)
OK to use 

## 2012-08-17 NOTE — Telephone Encounter (Signed)
Pt was given voltaren gel 1% for her knee and was told to call and make sure ok to use, pls call

## 2012-08-17 NOTE — Telephone Encounter (Signed)
Will forward to Dr Percival Spanish to verify

## 2012-08-17 NOTE — Telephone Encounter (Signed)
Left message for pt that it is OK to use Voltaren gel and to call back with any concerns or questions

## 2012-09-12 ENCOUNTER — Other Ambulatory Visit: Payer: Self-pay | Admitting: Family Medicine

## 2012-09-19 ENCOUNTER — Ambulatory Visit
Admission: RE | Admit: 2012-09-19 | Discharge: 2012-09-19 | Disposition: A | Discharge status: Home / Self Care | Payer: Medicare Other | Source: Ambulatory Visit | Attending: Family Medicine | Admitting: Family Medicine

## 2012-09-19 DIAGNOSIS — Z1231 Encounter for screening mammogram for malignant neoplasm of breast: Secondary | ICD-10-CM

## 2012-10-08 ENCOUNTER — Other Ambulatory Visit: Payer: Self-pay | Admitting: Gastroenterology

## 2012-10-11 ENCOUNTER — Other Ambulatory Visit (INDEPENDENT_AMBULATORY_CARE_PROVIDER_SITE_OTHER): Payer: Medicare Other

## 2012-10-11 ENCOUNTER — Telehealth: Payer: Self-pay | Admitting: Family Medicine

## 2012-10-11 DIAGNOSIS — E785 Hyperlipidemia, unspecified: Secondary | ICD-10-CM | POA: Diagnosis not present

## 2012-10-11 DIAGNOSIS — E119 Type 2 diabetes mellitus without complications: Secondary | ICD-10-CM

## 2012-10-11 LAB — LIPID PANEL
Cholesterol: 120 mg/dL (ref 0–200)
Triglycerides: 276 mg/dL — ABNORMAL HIGH (ref 0.0–149.0)

## 2012-10-11 LAB — COMPREHENSIVE METABOLIC PANEL
Albumin: 4.1 g/dL (ref 3.5–5.2)
BUN: 22 mg/dL (ref 6–23)
Calcium: 9.4 mg/dL (ref 8.4–10.5)
Chloride: 105 mEq/L (ref 96–112)
Glucose, Bld: 133 mg/dL — ABNORMAL HIGH (ref 70–99)
Potassium: 4.2 mEq/L (ref 3.5–5.1)
Sodium: 140 mEq/L (ref 135–145)
Total Protein: 7.5 g/dL (ref 6.0–8.3)

## 2012-10-11 LAB — LDL CHOLESTEROL, DIRECT: Direct LDL: 47.9 mg/dL

## 2012-10-11 NOTE — Telephone Encounter (Signed)
Message copied by [name] on Tue Oct 11, 2012 12:37 AM ------      Message from: Ellamae Sia      Created: Fri Oct 07, 2012 11:24 AM      Regarding: Lab orders for Tuesday, 1.14.14       Patient is scheduled for CPX labs, please order future labs, Thanks , Karna Christmas

## 2012-10-13 ENCOUNTER — Telehealth: Payer: Self-pay

## 2012-10-13 NOTE — Telephone Encounter (Signed)
Pt brought forms for prior auth for glyburide-metformin; pt said she had been paying 0 copay and this time was going to be charged $2.61; pt did not pick up med and wants PA done. Spoke with [facility] said because pt is over 65 Tier has changed.PA forms in Dr Rometta Emery in box.

## 2012-10-13 NOTE — Telephone Encounter (Signed)
Will complete 

## 2012-10-18 ENCOUNTER — Encounter: Payer: Self-pay | Admitting: Family Medicine

## 2012-10-18 ENCOUNTER — Ambulatory Visit (INDEPENDENT_AMBULATORY_CARE_PROVIDER_SITE_OTHER): Payer: Medicare Other | Admitting: Family Medicine

## 2012-10-18 VITALS — BP 130/82 | HR 55 | Temp 98.4°F | Ht 66.0 in | Wt 176.5 lb

## 2012-10-18 DIAGNOSIS — I10 Essential (primary) hypertension: Secondary | ICD-10-CM

## 2012-10-18 DIAGNOSIS — Z0000 Encounter for general adult medical examination without abnormal findings: Secondary | ICD-10-CM | POA: Diagnosis not present

## 2012-10-18 DIAGNOSIS — E785 Hyperlipidemia, unspecified: Secondary | ICD-10-CM

## 2012-10-18 DIAGNOSIS — E119 Type 2 diabetes mellitus without complications: Secondary | ICD-10-CM

## 2012-10-18 DIAGNOSIS — R3915 Urgency of urination: Secondary | ICD-10-CM

## 2012-10-18 DIAGNOSIS — Z780 Asymptomatic menopausal state: Secondary | ICD-10-CM | POA: Diagnosis not present

## 2012-10-18 DIAGNOSIS — N3941 Urge incontinence: Secondary | ICD-10-CM | POA: Diagnosis not present

## 2012-10-18 NOTE — Assessment & Plan Note (Signed)
UA clear. Discussed medication to treat.Marland Kitchen She will consider.

## 2012-10-18 NOTE — Assessment & Plan Note (Addendum)
LDL well controlled , trig high. Continue lipitor, increase fish oil and review how much active DHA anad EPA taking.

## 2012-10-18 NOTE — Progress Notes (Signed)
HPI  I have personally reviewed the Medicare Annual Wellness questionnaire and have noted  1. The patient's medical and social history  2. Their use of alcohol, tobacco or illicit drugs  3. Their current medications and supplements  4. The patient's functional ability including ADL's, fall risks, home safety risks and hearing or visual  impairment.  5. Diet and physical activities  6. Evidence for depression or mood disorders   The patients weight, height, BMI and visual acuity have been recorded in the chart  I have made referrals, counseling and provided education to the patient based review of the above and I have provided the pt with a written personalized care plan for preventive services.   Hypertension: Well controlled  Using medication without problems or lightheadedness:  Occ off and on for longtime. Occ falls. Chest pain with exertion: None  Edema Intermittent when on feet for long time. Short of breath: Mild  Average home BPs: 130-145/60-75 Other issues: CAD, afib: stable per cards, last OV in 09/2010   Diabetes: On glucovance   Lab Results  Component Value Date   HGBA1C 6.1 10/11/2012  Using medications without difficulties:  None Hypoglycemic episodes:None  Hyperglycemic episodes:None  Feet problems:None  Blood Sugars averaging: 110-120 Eye exam within last year: 12/12/10   Elevated Cholesterol:well controlled on current med. Lab Results  Component Value Date   CHOL 120 10/11/2012   HDL 39.30 10/11/2012   LDLCALC 15 08/28/2011   LDLDIRECT 47.9 10/11/2012   TRIG 276.0* 10/11/2012   CHOLHDL 3 10/11/2012   Using medications without problems:  Muscle aches: None  Other complaints:  Moderate diet, occ walking for exercise.  Right knee pain... Has had 2 knee operation, replacement in last year. No further options for surgical treatment.  Review of Systems  Constitutional: Negative for fever and fatigue.  HENT: Negative for ear pain.  Eyes: Negative for pain.    Respiratory: Negative for chest tightness and shortness of breath.  Cardiovascular: Negative for chest pain, palpitations and leg swelling.  Gastrointestinal: Negative for abdominal pain.  Genitourinary: Negative for dysuria.  Urinary frequency and urgency, occ incontinence, 1-2 UOP at night, every 2-3 hours during the day.   Objective:   Physical Exam  Constitutional: Vital signs are normal. She appears well-developed and well-nourished. She is cooperative. Non-toxic appearance. She does not appear ill. No distress.  HENT:  Head: Normocephalic.  Right Ear: Hearing, tympanic membrane, external ear and ear canal normal.  Left Ear: Hearing, tympanic membrane, external ear and ear canal normal.  Nose: Nose normal.  Eyes: Conjunctivae, EOM and lids are normal. Pupils are equal, round, and reactive to light. No foreign bodies found.  Neck: Trachea normal and normal range of motion. Neck supple. Carotid bruit is not present. No mass and no thyromegaly present.  Cardiovascular: Normal rate, regular rhythm, S1 normal, S2 normal, normal heart sounds and intact distal pulses. Exam reveals no gallop.  No murmur heard.  Pulmonary/Chest: Effort normal and breath sounds normal. No respiratory distress. She has no wheezes. She has no rhonchi. She has no rales.  Abdominal: Soft. Normal appearance and bowel sounds are normal. She exhibits no distension, no fluid wave, no abdominal bruit and no mass. There is no hepatosplenomegaly. There is no tenderness. There is no rebound, no guarding and no CVA tenderness. No hernia.  Genitourinary: No breast swelling, tenderness, discharge or bleeding. Pelvic exam was performed with patient prone.  Lymphadenopathy:  She has no cervical adenopathy.  She has no axillary adenopathy.  Neurological: She is alert. She has normal strength. No cranial nerve deficit or sensory deficit.  Skin: Skin is warm, dry and intact. No rash noted.  Psychiatric: Her speech is normal and  behavior is normal. Judgment normal. Her mood appears not anxious. Cognition and memory are normal. She does not exhibit a depressed mood.   Diabetic foot exam:  Normal inspection  No skin breakdown  No calluses  Normal DP pulses  Normal sensation to light tough and monofilament  Nails normal  Assessment & Plan:   Annual Medicare Wellness: The patient's preventative maintenance and recommended screening tests for an annual wellness exam were reviewed in full today.  Brought up to date unless services declined.  Counselled on the importance of diet, exercise, and its role in overall health and mortality.  The patient's FH and SH was reviewed, including their home life, tobacco status, and drug and alcohol status.   DEXA due, last nml  2009.. Will schedule Mammogram due in 08/2012.  Uptodate with colonoscopy with hx of colon cancer.. Planning repeat in 11/2012 PAP/DVE not indicated at this age. No vaginal bleeding. Nonsmoker Vaccine: look into shingles otherwise uptodate

## 2012-10-18 NOTE — Assessment & Plan Note (Signed)
Well controlled. Continue current medication.  

## 2012-10-18 NOTE — Patient Instructions (Addendum)
Look at active ingredients: DHA and EPA in fish oil should equal 2000-4000 mg divided daily Stop at front desk to set up bone density. Call insurance about shingles coverage. Review living will info.

## 2012-10-18 NOTE — Assessment & Plan Note (Signed)
Well controlled 

## 2012-10-26 ENCOUNTER — Other Ambulatory Visit: Payer: Self-pay | Admitting: Family Medicine

## 2012-11-04 ENCOUNTER — Encounter: Payer: Self-pay | Admitting: Gastroenterology

## 2012-11-09 ENCOUNTER — Encounter: Payer: Self-pay | Admitting: Gastroenterology

## 2012-11-09 DIAGNOSIS — Z96659 Presence of unspecified artificial knee joint: Secondary | ICD-10-CM | POA: Diagnosis not present

## 2012-11-09 DIAGNOSIS — M1710 Unilateral primary osteoarthritis, unspecified knee: Secondary | ICD-10-CM | POA: Diagnosis not present

## 2012-11-09 DIAGNOSIS — M2550 Pain in unspecified joint: Secondary | ICD-10-CM | POA: Diagnosis not present

## 2012-11-12 ENCOUNTER — Other Ambulatory Visit: Payer: Self-pay

## 2012-11-16 ENCOUNTER — Other Ambulatory Visit: Payer: Self-pay

## 2012-11-16 MED ORDER — GLYBURIDE 1.25 MG PO TABS: 1.2500 mg | ORAL_TABLET | Freq: Two times a day (BID) | ORAL | Status: DC

## 2012-11-16 MED ORDER — METFORMIN HCL 500 MG PO TABS: 250.0000 mg | ORAL_TABLET | Freq: Two times a day (BID) | ORAL | Status: DC

## 2012-11-16 NOTE — Telephone Encounter (Signed)
Pt left v/m requesting refill glyburide-metformin to [facility]. # 60 x 5 sent.

## 2012-11-16 NOTE — Telephone Encounter (Signed)
Sent in rx for separate medications

## 2012-11-16 NOTE — Telephone Encounter (Signed)
Pt left note that insurance will not pay for glyburide-metformin; request called in separately to [facility]. Advised pt Heather had sent med.

## 2012-11-22 ENCOUNTER — Telehealth: Payer: Self-pay | Admitting: Gastroenterology

## 2012-11-22 ENCOUNTER — Telehealth: Payer: Self-pay

## 2012-11-22 NOTE — Telephone Encounter (Signed)
Pt received letter from [employer] that her insurance would pay for glyburide-metformin with 0 copay to pt. Pt wants to take just the one pill if possible. [employer] will fax over PA form today.

## 2012-11-23 NOTE — Telephone Encounter (Signed)
[phone] insurance prior auth line phone number given to the pt

## 2012-11-23 NOTE — Telephone Encounter (Signed)
Prior auth form faxed [facility]

## 2012-11-30 ENCOUNTER — Ambulatory Visit (AMBULATORY_SURGERY_CENTER): Payer: Medicare Other

## 2012-11-30 VITALS — Ht 66.0 in | Wt 180.0 lb

## 2012-11-30 DIAGNOSIS — Z85038 Personal history of other malignant neoplasm of large intestine: Secondary | ICD-10-CM

## 2012-11-30 DIAGNOSIS — Z860100 Personal history of colon polyps, unspecified: Secondary | ICD-10-CM

## 2012-11-30 MED ORDER — NA SULFATE-K SULFATE-MG SULF 17.5-3.13-1.6 GM/177ML PO SOLN: 1.0000 | Freq: Once | ORAL | Status: DC

## 2012-12-05 ENCOUNTER — Other Ambulatory Visit: Payer: Self-pay | Admitting: Gastroenterology

## 2012-12-06 NOTE — Telephone Encounter (Signed)
Spoke with pt and she will call [employer] and request them to send a PA form for glyburide-metformin; cannot see where PA has previously been received.

## 2012-12-09 ENCOUNTER — Other Ambulatory Visit: Payer: Self-pay | Admitting: Family Medicine

## 2012-12-12 DIAGNOSIS — M2550 Pain in unspecified joint: Secondary | ICD-10-CM | POA: Diagnosis not present

## 2012-12-12 DIAGNOSIS — M1710 Unilateral primary osteoarthritis, unspecified knee: Secondary | ICD-10-CM | POA: Diagnosis not present

## 2012-12-13 ENCOUNTER — Telehealth: Payer: Self-pay | Admitting: *Deleted

## 2012-12-13 NOTE — Telephone Encounter (Signed)
Prior auths are needed for glyburide and glyburide/ metformin.  Forms are on your desk.  There are seperate forms for both meds.

## 2012-12-13 NOTE — Telephone Encounter (Signed)
Completed in outbox.

## 2012-12-14 ENCOUNTER — Other Ambulatory Visit: Payer: Self-pay | Admitting: Gastroenterology

## 2012-12-14 ENCOUNTER — Ambulatory Visit (AMBULATORY_SURGERY_CENTER): Payer: Medicare Other | Admitting: Gastroenterology

## 2012-12-14 ENCOUNTER — Encounter: Payer: Self-pay | Admitting: Gastroenterology

## 2012-12-14 VITALS — BP 146/78 | HR 67 | Temp 98.5°F | Resp 20 | Ht 66.0 in | Wt 180.0 lb

## 2012-12-14 DIAGNOSIS — Z86010 Personal history of colonic polyps: Secondary | ICD-10-CM | POA: Diagnosis not present

## 2012-12-14 DIAGNOSIS — Z85038 Personal history of other malignant neoplasm of large intestine: Secondary | ICD-10-CM | POA: Diagnosis not present

## 2012-12-14 DIAGNOSIS — D126 Benign neoplasm of colon, unspecified: Secondary | ICD-10-CM | POA: Diagnosis not present

## 2012-12-14 LAB — GLUCOSE, CAPILLARY
Glucose-Capillary: 148 mg/dL — ABNORMAL HIGH (ref 70–99)
Glucose-Capillary: 113 mg/dL — ABNORMAL HIGH (ref 70–99)

## 2012-12-14 MED ORDER — SODIUM CHLORIDE 0.9 % IV SOLN: 500.0000 mL | INTRAVENOUS | Status: DC

## 2012-12-14 NOTE — Telephone Encounter (Signed)
Forms faxed

## 2012-12-14 NOTE — Telephone Encounter (Signed)
Prior auths approved for both medicines.  Advised patient and pharmacy.  Approval letters placed on doctor's desk for signature and scanning.

## 2012-12-14 NOTE — Op Note (Signed)
[place]  [employer]. [place], [address]   COLONOSCOPY PROCEDURE REPORT  PATIENT: Tonya [name], Tonya [name]  MR#: [id number] BIRTHDATE: [birth year] , 68  yrs. old GENDER: Female ENDOSCOPIST: Milus Banister, MD PROCEDURE DATE:  12/14/2012 PROCEDURE:   Colonoscopy with snare polypectomy ASA CLASS:   Class III INDICATIONS:Synrchonous early stage right sided cancers, resected 11/2008; No positive lymph nodes, she did not require adjuvant chemotherapy.  Repeat colonoscopy March 2011 showed diverticulosis, 2 small HPs, nodule at anastomosis that was NOT neoplastic, recall colonsocopy 3 year interval. MEDICATIONS: Fentanyl 50 mcg IV, Versed 5 mg IV, and These medications were titrated to patient response per physician's verbal order  DESCRIPTION OF PROCEDURE:   After the risks benefits and alternatives of the procedure were thoroughly explained, informed consent was obtained.  A digital rectal exam revealed no abnormalities of the rectum.   The LB [id number]-H180AL [id number]  endoscope was introduced through the anus and advanced to the surgical anastomosis. No adverse events experienced.   The quality of the prep was good.  The instrument was then slowly withdrawn as the colon was fully examined.  COLON FINDINGS: There was no evidence of The right sided anastomosis was normal except for small stitch related nodule.  There were three small sessile polyps, 3-35mm across, located perianastomosis and in descending segment that were all removed with cold snare and all sent to pathology.  The examination was otherwise normal. Retroflexed views revealed no abnormalities. The time to cecum=minutes 0 seconds.  Withdrawal time=19 minutes 33 seconds. The scope was withdrawn and the procedure completed. COMPLICATIONS: There were no complications.  ENDOSCOPIC IMPRESSION: There was no evidence of The right sided anastomosis was normal except for small stitch related nodule.  There were three  small sessile polyps, 3-76mm across, located perianastomosis and in descending segment that were all removed with cold snare and all sent to pathology.  The examination was otherwise normal   RECOMMENDATIONS: You will receive a letter within 1-2 weeks with the results of your biopsy as well as final recommendations.  Please call my office if you have not received a letter after 3 weeks.  Likely you will need repeat colonoscopy in 5 years   eSigned:  Milus Banister, MD 12/14/2012 10:23 AM    cc: [name], MD

## 2012-12-14 NOTE — Progress Notes (Signed)
Patient did not experience any of the following events: a burn prior to discharge; a fall within the facility; wrong site/side/patient/procedure/implant event; or a hospital transfer or hospital admission upon discharge from the facility. (G8907) Patient did not have preoperative order for IV antibiotic SSI prophylaxis. (G8918)  

## 2012-12-14 NOTE — Patient Instructions (Addendum)
Impressions/recommendations:  Polyps (handout given)  Repeat colonoscopy pending pathology results.  YOU HAD AN ENDOSCOPIC PROCEDURE TODAY AT [facility]: Refer to the procedure report that was given to you for any specific questions about what was found during the examination.  If the procedure report does not answer your questions, please call your gastroenterologist to clarify.  If you requested that your care partner not be given the details of your procedure findings, then the procedure report has been included in a sealed envelope for you to review at your convenience later.  YOU SHOULD EXPECT: Some feelings of bloating in the abdomen. Passage of more gas than usual.  Walking can help get rid of the air that was put into your GI tract during the procedure and reduce the bloating. If you had a lower endoscopy (such as a colonoscopy or flexible sigmoidoscopy) you may notice spotting of blood in your stool or on the toilet paper. If you underwent a bowel prep for your procedure, then you may not have a normal bowel movement for a few days.  DIET: Your first meal following the procedure should be a light meal and then it is ok to progress to your normal diet.  A half-sandwich or bowl of soup is an example of a good first meal.  Heavy or fried foods are harder to digest and may make you feel nauseous or bloated.  Likewise meals heavy in dairy and vegetables can cause extra gas to form and this can also increase the bloating.  Drink plenty of fluids but you should avoid alcoholic beverages for 24 hours.  ACTIVITY: Your care partner should take you home directly after the procedure.  You should plan to take it easy, moving slowly for the rest of the day.  You can resume normal activity the day after the procedure however you should NOT DRIVE or use heavy machinery for 24 hours (because of the sedation medicines used during the test).    SYMPTOMS TO REPORT IMMEDIATELY: A  gastroenterologist can be reached at any hour.  During normal business hours, 8:30 AM to 5:00 PM Monday through Friday, call [phone].  After hours and on weekends, please call the GI answering service at [phone] who will take a message and have the physician on call contact you.   Following lower endoscopy (colonoscopy or flexible sigmoidoscopy):  Excessive amounts of blood in the stool  Significant tenderness or worsening of abdominal pains  Swelling of the abdomen that is new, acute  Fever of 100F or higher   FOLLOW UP: If any biopsies were taken you will be contacted by phone or by letter within the next 1-3 weeks.  Call your gastroenterologist if you have not heard about the biopsies in 3 weeks.  Our staff will call the home number listed on your records the next business day following your procedure to check on you and address any questions or concerns that you may have at that time regarding the information given to you following your procedure. This is a courtesy call and so if there is no answer at the home number and we have not heard from you through the emergency physician on call, we will assume that you have returned to your regular daily activities without incident.  SIGNATURES/CONFIDENTIALITY: You and/or your care partner have signed paperwork which will be entered into your electronic medical record.  These signatures attest to the fact that that the information above on your After Visit Summary has  been reviewed and is understood.  Full responsibility of the confidentiality of this discharge information lies with you and/or your care-partner.

## 2012-12-15 ENCOUNTER — Telehealth: Payer: Self-pay | Admitting: *Deleted

## 2012-12-15 NOTE — Telephone Encounter (Signed)
  Follow up Call-  Call back number 12/14/2012  Post procedure Call Back phone  # cell - [phone]  Permission to leave phone message Yes     Patient questions:  Do you have a fever, pain , or abdominal swelling? no Pain Score  0 *  Have you tolerated food without any problems? yes  Have you been able to return to your normal activities? yes  Do you have any questions about your discharge instructions: Diet   no Medications  no Follow up visit  no  Do you have questions or concerns about your Care? no  Actions: * If pain score is 4 or above: No action needed, pain <4.

## 2012-12-16 DIAGNOSIS — M47817 Spondylosis without myelopathy or radiculopathy, lumbosacral region: Secondary | ICD-10-CM | POA: Diagnosis not present

## 2012-12-20 ENCOUNTER — Encounter: Payer: Self-pay | Admitting: Gastroenterology

## 2012-12-21 DIAGNOSIS — H43819 Vitreous degeneration, unspecified eye: Secondary | ICD-10-CM | POA: Diagnosis not present

## 2012-12-21 DIAGNOSIS — H04129 Dry eye syndrome of unspecified lacrimal gland: Secondary | ICD-10-CM | POA: Diagnosis not present

## 2012-12-21 DIAGNOSIS — E119 Type 2 diabetes mellitus without complications: Secondary | ICD-10-CM | POA: Diagnosis not present

## 2012-12-21 DIAGNOSIS — H2510 Age-related nuclear cataract, unspecified eye: Secondary | ICD-10-CM | POA: Diagnosis not present

## 2012-12-24 LAB — HM DIABETES EYE EXAM

## 2012-12-31 ENCOUNTER — Other Ambulatory Visit: Payer: Self-pay | Admitting: Family Medicine

## 2013-01-02 DIAGNOSIS — M48061 Spinal stenosis, lumbar region without neurogenic claudication: Secondary | ICD-10-CM | POA: Diagnosis not present

## 2013-01-03 ENCOUNTER — Other Ambulatory Visit: Payer: Self-pay | Admitting: Orthopaedic Surgery

## 2013-01-03 DIAGNOSIS — M4800 Spinal stenosis, site unspecified: Secondary | ICD-10-CM

## 2013-01-06 ENCOUNTER — Ambulatory Visit
Admission: RE | Admit: 2013-01-06 | Discharge: 2013-01-06 | Disposition: A | Discharge status: Home / Self Care | Payer: Medicare Other | Source: Ambulatory Visit | Attending: Orthopaedic Surgery | Admitting: Orthopaedic Surgery

## 2013-01-06 VITALS — BP 212/86 | HR 67

## 2013-01-06 DIAGNOSIS — M4800 Spinal stenosis, site unspecified: Secondary | ICD-10-CM

## 2013-01-06 DIAGNOSIS — M47817 Spondylosis without myelopathy or radiculopathy, lumbosacral region: Secondary | ICD-10-CM | POA: Diagnosis not present

## 2013-01-06 MED ORDER — IOHEXOL 180 MG/ML  SOLN
1.0000 mL | Freq: Once | INTRAMUSCULAR | Status: AC | PRN
  Administered 2013-01-06: 1 mL via EPIDURAL

## 2013-01-06 MED ORDER — METHYLPREDNISOLONE ACETATE 40 MG/ML INJ SUSP (RADIOLOG
120.0000 mg | Freq: Once | INTRAMUSCULAR | Status: AC
  Administered 2013-01-06: 120 mg via EPIDURAL

## 2013-01-09 ENCOUNTER — Other Ambulatory Visit: Payer: Self-pay | Admitting: Family Medicine

## 2013-01-11 DIAGNOSIS — M25569 Pain in unspecified knee: Secondary | ICD-10-CM | POA: Diagnosis not present

## 2013-01-15 ENCOUNTER — Other Ambulatory Visit: Payer: Self-pay | Admitting: Gastroenterology

## 2013-01-18 ENCOUNTER — Encounter: Payer: Self-pay | Admitting: Family Medicine

## 2013-01-24 ENCOUNTER — Encounter: Payer: Self-pay | Admitting: Family Medicine

## 2013-01-24 ENCOUNTER — Ambulatory Visit (INDEPENDENT_AMBULATORY_CARE_PROVIDER_SITE_OTHER): Payer: Medicare Other | Admitting: Family Medicine

## 2013-01-24 VITALS — BP 147/82 | HR 60 | Temp 98.2°F | Wt 181.0 lb

## 2013-01-24 DIAGNOSIS — R21 Rash and other nonspecific skin eruption: Secondary | ICD-10-CM | POA: Diagnosis not present

## 2013-01-24 DIAGNOSIS — I10 Essential (primary) hypertension: Secondary | ICD-10-CM

## 2013-01-24 MED ORDER — FLUOCINONIDE-E 0.05 % EX CREA: TOPICAL_CREAM | Freq: Two times a day (BID) | CUTANEOUS | Status: DC

## 2013-01-24 NOTE — Patient Instructions (Addendum)
Good to see you. I do you think have contact dermatitis. Please wash all of your sheets and towels.  Try to think of anything new.  Use lidex twice daily for 10 days.  If it has not improved by then, call us.  Continue the Zyrtec.

## 2013-01-24 NOTE — Progress Notes (Signed)
Subjective:    Patient ID: Tonya [name], female    DOB: 10-26-1940, 72 y.o.   MRN: [id number]  HPI 73 yo pt of Dr. Diona Browner, new to me, here for acute visit for rash.  Rash-  Started last week as very itchy spot on right leg, has spread now to entire right leg, bilateral arms and wrists. No new changes in soaps or detergents.  No contacts with animals.  Has not been outside.  Very itchy- zyrtec does help.  No SOB, difficulty swallowing or wheezing.  Never had anything like this in past.  Patient Active Problem List   Diagnosis Date Noted  . Rash and nonspecific skin eruption 01/24/2013  . Urinary urgency 10/18/2012  . ATRIAL FIBRILLATION, PAROXYSMAL 10/17/2010  . ALLERGIC RHINITIS 01/23/2009  . ADENOCARCINOMA, COLON, CECUM 10/05/2008  . CORONARY ARTERY DISEASE 11/23/2007  . HYPERLIPIDEMIA 09/19/2007  . ANXIETY 09/19/2007  . DEPRESSION 09/19/2007  . GERD 09/19/2007  . DIABETES MELLITUS, TYPE II 03/21/2007  . SYNDROME, CARPAL TUNNEL 03/21/2007  . HYPERTENSION 03/21/2007  . IBS 03/21/2007  . Osteoarth NOS-Unspec 03/21/2007   Past Medical History  Diagnosis Date  . Osteoarthritis   . Edema, peripheral   . CAD (coronary artery disease)     s/p BMS to LAD  . Breast pain     left  . Hyperlipidemia   . Hypertension   . Sinusitis     acute- NOS  . Allergic rhinitis   . Blood in stool   . Adenocarcinoma, colon   . Anemia   . Sciatica   . Cystitis, acute   . Chronic diarrhea   . Nausea   . GERD (gastroesophageal reflux disease)   . Depression   . Anxiety   . Osteoarthritis   . IBS (irritable bowel syndrome)   . Degenerative disk disease   . Carpal tunnel syndrome   . Diabetes mellitus, type 2   . Family history of diabetes mellitus   . History of pneumonia   . Urinary frequency   . Nocturia    Past Surgical History  Procedure Laterality Date  . Knee arthroscopy      bilateral  . Cholecystectomy    . Cardiac catheterization  12/10    LAD stent patent.  Insignificant CAD, otherwise EF 60-65%  . Partial colectomy  11/2008    right, for adenocarcinoma  . Replacement total knee  99991111    right, complicated by hemarthrosis   . Hematoma evacuation      after knee replacement  . Total knee revision  03/08/2012    Procedure: TOTAL KNEE REVISION;  Surgeon: Garald Balding, MD;  Location: [facility];  Service: Orthopedics;  Laterality: Right;  removal total knee hardware and placement of antibiotic cement spacer and antibiotic beads  . Carpal tunnel release    . Total knee revision  05/17/2012    Procedure: TOTAL KNEE REVISION;  Surgeon: Garald Balding, MD;  Location: [place];  Service: Orthopedics;  Laterality: Right;  right total knee revision, removal of antibiotic spacer   History  Substance Use Topics  . Smoking status: Never Smoker   . Smokeless tobacco: Never Used  . Alcohol Use: No   Family History  Problem Relation Age of Onset  . Heart disease Mother     massive MI age 44  . Heart attack Mother   . Heart disease Father     Massive MI age 55  . Prostate cancer Father   . Colon cancer Neg  Hx    Allergies  Allergen Reactions  . Amoxicillin-Pot Clavulanate Swelling    Augmentin per patient, tongue swelling   Current Outpatient Prescriptions on File Prior to Visit  Medication Sig Dispense Refill  . aspirin 81 MG tablet Take 81 mg by mouth daily.      Marland Kitchen atorvastatin (LIPITOR) 20 MG tablet Take 20 mg by mouth daily.      . calcium-vitamin D (OSCAL WITH D 500-200) 500-200 MG-UNIT per tablet Take 1 tablet by mouth 2 (two) times daily.        . carvedilol (COREG) 12.5 MG tablet TAKE 1 TABLET BY MOUTH TWICE A DAY WITH A MEAL AS DIRECTED  60 tablet  3  . citalopram (CELEXA) 40 MG tablet Take 40 mg by mouth daily.      Marland Kitchen diltiazem (CARDIZEM CD) 240 MG 24 hr capsule       . glyBURIDE-metformin (GLUCOVANCE) 1.25-250 MG per tablet TAKE 1 TABLET BY MOUTH TWICE A DAY  60 tablet  5  . hydrochlorothiazide (HYDRODIURIL) 25 MG tablet Take 25 mg by  mouth daily.      Marland Kitchen KLOR-CON M20 20 MEQ tablet TAKE 1 TABLET BY MOUTH TWICE A DAY  60 tablet  3  . Multiple Vitamin (MULTIVITAMIN WITH MINERALS) TABS Take 1 tablet by mouth daily.      . nabumetone (RELAFEN) 750 MG tablet TAKE 1 TABLET DAILY ALTERNATING WITH 1 TABLET TWICE DAILY AS DIRECTED  60 tablet  4  . Omega-3 Fatty Acids (FISH OIL) 1200 MG CAPS Take by mouth. One in AM and two at PM.      . omeprazole (PRILOSEC) 20 MG capsule TAKE 2 CAPSULES BY MOUTH EVERY DAY  180 capsule  1  . PREVALITE 4 G packet TAKE 1 PACKET (4 G TOTAL) BY MOUTH 2 (TWO) TIMES DAILY WITH A MEAL.  60 packet  1  . ferrous sulfate 325 (65 FE) MG EC tablet Take 325 mg by mouth 2 (two) times daily.       No current facility-administered medications on file prior to visit.   The PMH, PSH, Social History, Family History, Medications, and allergies have been reviewed in [facility], and have been updated if relevant.    Review of Systems See HPI    Objective:   Physical Exam  Constitutional: She appears well-developed and well-nourished. No distress.  HENT:  Head: Normocephalic.  Skin: Skin is warm and dry. Rash noted. Rash is maculopapular.     Psychiatric: She has a normal mood and affect. Her speech is normal and behavior is normal. Judgment and thought content normal. Cognition and memory are normal.   BP 147/82  Pulse 60  Temp(Src) 98.2 F (36.8 C)  Wt 181 lb (82.101 kg)  BMI 29.23 kg/m2        Assessment & Plan:  1. Rash and nonspecific skin eruption Consistent with contact dermatitis- unclear allergen. Advised washing all sheets and towels and to think about anything she may have changed. She declined oral steroids.  Will treat with topical lidex twice daily x 10 days. Zyrtec. Call or return to clinic prn if these symptoms worsen or fail to improve as anticipated. The patient indicates understanding of these issues and agrees with the plan.

## 2013-01-29 ENCOUNTER — Encounter: Payer: Self-pay | Admitting: Family Medicine

## 2013-01-29 DIAGNOSIS — R21 Rash and other nonspecific skin eruption: Secondary | ICD-10-CM

## 2013-01-30 DIAGNOSIS — M25569 Pain in unspecified knee: Secondary | ICD-10-CM | POA: Diagnosis not present

## 2013-01-31 ENCOUNTER — Ambulatory Visit (INDEPENDENT_AMBULATORY_CARE_PROVIDER_SITE_OTHER): Payer: Medicare Other | Admitting: Family Medicine

## 2013-01-31 ENCOUNTER — Encounter: Payer: Self-pay | Admitting: Family Medicine

## 2013-01-31 VITALS — BP 160/90 | HR 69 | Temp 98.3°F | Ht 66.0 in | Wt 183.8 lb

## 2013-01-31 DIAGNOSIS — R3915 Urgency of urination: Secondary | ICD-10-CM

## 2013-01-31 DIAGNOSIS — R21 Rash and other nonspecific skin eruption: Secondary | ICD-10-CM

## 2013-01-31 DIAGNOSIS — I10 Essential (primary) hypertension: Secondary | ICD-10-CM

## 2013-01-31 MED ORDER — LISINOPRIL-HYDROCHLOROTHIAZIDE 20-12.5 MG PO TABS: 1.0000 | ORAL_TABLET | Freq: Every day | ORAL | Status: DC

## 2013-01-31 NOTE — Assessment & Plan Note (Signed)
Refer to derm for further eval.

## 2013-01-31 NOTE — Telephone Encounter (Signed)
Will have pt make appt fr elevated BP. Refer to derm for rash not improving.

## 2013-01-31 NOTE — Assessment & Plan Note (Signed)
Inadequate control.. May be due to recent steroids.  Stop HCTZ and add back lisinopril HCTZ.. Will need to watch Cr closely.. May need to increase to 2 tabs po daily.

## 2013-01-31 NOTE — Patient Instructions (Addendum)
Stop at front desk to set up referral to dermatology.  Stop HCTZ , replace with lisinopril HCTZ... Return in 7-10 days for BMET to make sure kidneys working well. Let me know if you are interested in medication such as vesicare or toviaz...for urge incontinence.

## 2013-01-31 NOTE — Progress Notes (Signed)
  Subjective:    Patient ID: Tonya [name], female    DOB: 04-21-1941, 72 y.o.   MRN: [id number]  HPI 72 year old female presents for evaluation given recent increase in BP measurements at home.  She is planning on seeing Derm for rash that is spreading to arms but less itchy on legs.  Hypertension:  Inadequate control on HCTZ, diltiazem 240, coreg.  HR 69 today. She has noted BP elevations in last 5 days.  She received steroid shot in her back on 01/06/2013.  She saw ORTHo on 4/16 got cortisone in left knee. Using medication without problems or lightheadedness: Occ.. But this is chronic issues with her Chest pain: None No heart racing or arrythmia. No parox[employer] episodes Edema:None Short of breath:None Average home BPs: 145/74-171/104, HR 60-62 at home Other issues:  Continues to have issues with urge incontinence, ongoing for 6 month-1 year,worse in last 6 months. Rushing to restroom.Marland Kitchen occ loses control and wets underware/pad. No dysuria, no hematuria, no fever, no abdominal pain. UOP  Every 2-3 hours, getting up 4 times a night.   Review of Systems  Constitutional: Negative for fever and fatigue.  HENT: Negative for ear pain.   Eyes: Negative for pain.  Respiratory: Negative for chest tightness and shortness of breath.   Cardiovascular: Negative for chest pain, palpitations and leg swelling.  Gastrointestinal: Negative for abdominal pain.  Genitourinary: Negative for dysuria.       Objective:   Physical Exam  Constitutional: Vital signs are normal. She appears well-developed and well-nourished. She is cooperative.  Non-toxic appearance. She does not appear ill. No distress.  HENT:  Head: Normocephalic.  Right Ear: Hearing, tympanic membrane, external ear and ear canal normal. Tympanic membrane is not erythematous, not retracted and not bulging.  Left Ear: Hearing, tympanic membrane, external ear and ear canal normal. Tympanic membrane is not erythematous, not retracted  and not bulging.  Nose: No mucosal edema or rhinorrhea. Right sinus exhibits no maxillary sinus tenderness and no frontal sinus tenderness. Left sinus exhibits no maxillary sinus tenderness and no frontal sinus tenderness.  Mouth/Throat: Uvula is midline, oropharynx is clear and moist and mucous membranes are normal.  Eyes: Conjunctivae, EOM and lids are normal. Pupils are equal, round, and reactive to light. No foreign bodies found.  Neck: Trachea normal and normal range of motion. Neck supple. Carotid bruit is not present. No mass and no thyromegaly present.  Cardiovascular: Normal rate, regular rhythm, S1 normal, S2 normal, normal heart sounds, intact distal pulses and normal pulses.  Exam reveals no gallop and no friction rub.   No murmur heard. Pulmonary/Chest: Effort normal and breath sounds normal. Not tachypneic. No respiratory distress. She has no decreased breath sounds. She has no wheezes. She has no rhonchi. She has no rales.  Abdominal: Soft. Normal appearance and bowel sounds are normal. There is no splenomegaly. There is no tenderness. There is no CVA tenderness.  Neurological: She is alert.  Skin: Skin is warm, dry and intact. No rash noted.  nonblanching  Macules, flat rash on left calf... Arms and leg also with pink papular rash  Psychiatric: Her speech is normal and behavior is normal. Judgment and thought content normal. Her mood appears not anxious. Cognition and memory are normal. She does not exhibit a depressed mood.          Assessment & Plan:

## 2013-01-31 NOTE — Assessment & Plan Note (Signed)
Discussed medications to treat.. Pt concerned about SE and does not wish to start medication at this time.

## 2013-02-07 ENCOUNTER — Other Ambulatory Visit (INDEPENDENT_AMBULATORY_CARE_PROVIDER_SITE_OTHER): Payer: Medicare Other

## 2013-02-07 DIAGNOSIS — I10 Essential (primary) hypertension: Secondary | ICD-10-CM

## 2013-02-07 LAB — BASIC METABOLIC PANEL
BUN: 23 mg/dL (ref 6–23)
Chloride: 109 mEq/L (ref 96–112)
Glucose, Bld: 139 mg/dL — ABNORMAL HIGH (ref 70–99)
Potassium: 4.6 mEq/L (ref 3.5–5.1)

## 2013-02-21 ENCOUNTER — Other Ambulatory Visit: Payer: Self-pay | Admitting: Family Medicine

## 2013-02-21 DIAGNOSIS — L259 Unspecified contact dermatitis, unspecified cause: Secondary | ICD-10-CM | POA: Diagnosis not present

## 2013-02-21 NOTE — Telephone Encounter (Signed)
Please contact pt.. Has rash improved, it should have by now? If not we should consider different plan... Like referral to derm.

## 2013-02-27 ENCOUNTER — Other Ambulatory Visit: Payer: Self-pay

## 2013-02-27 ENCOUNTER — Other Ambulatory Visit: Payer: Self-pay | Admitting: *Deleted

## 2013-02-27 MED ORDER — CARVEDILOL 12.5 MG PO TABS: 12.5000 mg | ORAL_TABLET | Freq: Two times a day (BID) | ORAL | Status: DC

## 2013-02-27 MED ORDER — CHOLESTYRAMINE LIGHT 4 G PO PACK: PACK | ORAL | Status: DC

## 2013-02-27 MED ORDER — POTASSIUM CHLORIDE CRYS ER 20 MEQ PO TBCR: EXTENDED_RELEASE_TABLET | ORAL | Status: DC

## 2013-02-27 MED ORDER — GLYBURIDE-METFORMIN 1.25-250 MG PO TABS: ORAL_TABLET | ORAL | Status: DC

## 2013-02-27 MED ORDER — LISINOPRIL-HYDROCHLOROTHIAZIDE 20-12.5 MG PO TABS: 1.0000 | ORAL_TABLET | Freq: Every day | ORAL | Status: DC

## 2013-02-27 NOTE — Telephone Encounter (Signed)
Pt request 90 day supply to be sent to [facility]

## 2013-03-23 ENCOUNTER — Other Ambulatory Visit: Payer: Self-pay | Admitting: Cardiology

## 2013-04-03 ENCOUNTER — Encounter: Payer: Self-pay | Admitting: Cardiology

## 2013-04-03 ENCOUNTER — Ambulatory Visit (INDEPENDENT_AMBULATORY_CARE_PROVIDER_SITE_OTHER): Payer: Medicare Other | Admitting: Cardiology

## 2013-04-03 VITALS — BP 142/80 | HR 59 | Ht 66.0 in | Wt 186.8 lb

## 2013-04-03 DIAGNOSIS — I2510 Atherosclerotic heart disease of native coronary artery without angina pectoris: Secondary | ICD-10-CM

## 2013-04-03 DIAGNOSIS — I4891 Unspecified atrial fibrillation: Secondary | ICD-10-CM

## 2013-04-03 MED ORDER — LISINOPRIL 10 MG PO TABS: 10.0000 mg | ORAL_TABLET | Freq: Every day | ORAL | Status: DC

## 2013-04-03 NOTE — Progress Notes (Signed)
HPI The patient  coronary disease as described below. Since I last saw her she has had her knee replaced twice.  With all of this she has not had any chest pressure, neck or arm discomfort. She has not had any shortness of breath, PND or orthopnea above her previous baseline. She's not had any palpitations, presyncope or syncope. She did complete her physical therapy but has not yet gotten into exercising.  Allergies  Allergen Reactions  . Amoxicillin-Pot Clavulanate Swelling    Augmentin per patient, tongue swelling    Current Outpatient Prescriptions  Medication Sig Dispense Refill  . aspirin 81 MG tablet Take 81 mg by mouth daily.      Marland Kitchen atorvastatin (LIPITOR) 20 MG tablet TAKE 1 TABLET (20 MG TOTAL) BY MOUTH DAILY.  90 tablet  3  . calcium-vitamin D (OSCAL WITH D 500-200) 500-200 MG-UNIT per tablet Take 1 tablet by mouth 2 (two) times daily.        . carvedilol (COREG) 12.5 MG tablet Take 1 tablet (12.5 mg total) by mouth 2 (two) times daily with a meal.  180 tablet  1  . cholestyramine light (PREVALITE) 4 G packet TAKE 1 PACKET (4 G TOTAL) BY MOUTH 2 (TWO) TIMES DAILY WITH A MEAL.  180 packet  3  . citalopram (CELEXA) 40 MG tablet Take 40 mg by mouth daily.      Marland Kitchen diltiazem (CARDIZEM CD) 240 MG 24 hr capsule Take 240 mg by mouth daily.       . ferrous sulfate 325 (65 FE) MG EC tablet Take 325 mg by mouth 2 (two) times daily.      Marland Kitchen glyBURIDE-metformin (GLUCOVANCE) 1.25-250 MG per tablet TAKE 1 TABLET BY MOUTH TWICE A DAY  180 tablet  1  . lisinopril-hydrochlorothiazide (PRINZIDE,ZESTORETIC) 20-12.5 MG per tablet Take 1 tablet by mouth daily.  90 tablet  1  . Multiple Vitamin (MULTIVITAMIN WITH MINERALS) TABS Take 1 tablet by mouth daily.      . nabumetone (RELAFEN) 750 MG tablet TAKE 1 TABLET DAILY ALTERNATING WITH 1 TABLET TWICE DAILY AS DIRECTED  60 tablet  4  . Omega-3 Fatty Acids (FISH OIL) 1200 MG CAPS Take by mouth. One in AM and two at PM.      . omeprazole (PRILOSEC) 20 MG  capsule TAKE 2 CAPSULES BY MOUTH EVERY DAY  180 capsule  1  . potassium chloride SA (KLOR-CON M20) 20 MEQ tablet TAKE 1 TABLET BY MOUTH TWICE A DAY  180 tablet  1   No current facility-administered medications for this visit.    Past Medical History  Diagnosis Date  . Osteoarthritis   . Edema, peripheral   . CAD (coronary artery disease)     s/p BMS to LAD  . Breast pain     left  . Hyperlipidemia   . Hypertension   . Sinusitis     acute- NOS  . Allergic rhinitis   . Adenocarcinoma, colon   . Anemia   . Sciatica   . Cystitis, acute   . Chronic diarrhea   . GERD (gastroesophageal reflux disease)   . Depression   . Anxiety   . Osteoarthritis   . IBS (irritable bowel syndrome)   . Degenerative disk disease   . Carpal tunnel syndrome   . Diabetes mellitus, type 2   . History of pneumonia   . Nocturia     Past Surgical History  Procedure Laterality Date  . Knee arthroscopy  bilateral  . Cholecystectomy    . Cardiac catheterization  12/10    LAD stent patent. Insignificant CAD, otherwise EF 60-65%  . Partial colectomy  11/2008    right, for adenocarcinoma  . Replacement total knee  99991111    right, complicated by hemarthrosis   . Hematoma evacuation      after knee replacement  . Total knee revision  03/08/2012    Procedure: TOTAL KNEE REVISION;  Surgeon: Garald Balding, MD;  Location: [facility];  Service: Orthopedics;  Laterality: Right;  removal total knee hardware and placement of antibiotic cement spacer and antibiotic beads  . Carpal tunnel release    . Total knee revision  05/17/2012    Procedure: TOTAL KNEE REVISION;  Surgeon: Garald Balding, MD;  Location: [facility];  Service: Orthopedics;  Laterality: Right;  right total knee revision, removal of antibiotic spacer    ROS:  As stated in the HPI and negative for all other systems.  PHYSICAL EXAM BP 142/80  Pulse 59  Ht 5\' 6"  (1.676 m)  Wt 186 lb 12.8 oz (84.732 kg)  BMI 30.16 kg/m2  SpO2 97% GENERAL:   Well appearing NECK:  No jugular venous distention, waveform within normal limits, carotid upstroke brisk and symmetric, no bruits, no thyromegaly LUNGS:  Clear to auscultation bilaterally BACK:  No CVA tenderness CHEST:  Unremarkable HEART:  PMI not displaced or sustained,S1 and S2 within normal limits, no S3, no S4, no clicks, no rubs, no murmurs ABD:  Flat, positive bowel sounds normal in frequency in pitch, no bruits, no rebound, no guarding, no midline pulsatile mass, no hepatomegaly, no splenomegaly EXT:  2 plus pulses throughout, mild right leg dema, no cyanosis no clubbing   EKG:  Sinus rhythm, rate 56, axis within normal limits, intervals within normal limits, no acute ST-T wave changes.  04/03/2013   ASSESSMENT AND PLAN  CAD:  The patient has no new sypmtoms.  No further cardiovascular testing is indicated.  We will continue with aggressive risk reduction and meds as listed.  HTN:  Her BP is not well controlled. I will add lisinopril 10 mg each bedtime. She's been instructed on a blood pressure diary and she will notify us if she's not at target.  DYSLIPIDEMIA:  She is at target with her lipid level and I reviewed his labs from January.

## 2013-04-03 NOTE — Patient Instructions (Addendum)
Please start Lisinopril 10 mg at bedtime Continue all other medications as listed.  Follow up in 1 year with Dr Percival Spanish.  You will receive a letter in the mail 2 months before you are due.  Please call us when you receive this letter to schedule your follow up appointment.

## 2013-04-07 ENCOUNTER — Other Ambulatory Visit: Payer: Self-pay | Admitting: Orthopaedic Surgery

## 2013-04-07 DIAGNOSIS — M48061 Spinal stenosis, lumbar region without neurogenic claudication: Secondary | ICD-10-CM

## 2013-04-11 ENCOUNTER — Ambulatory Visit
Admission: RE | Admit: 2013-04-11 | Discharge: 2013-04-11 | Disposition: A | Discharge status: Home / Self Care | Payer: Medicare Other | Source: Ambulatory Visit | Attending: Orthopaedic Surgery | Admitting: Orthopaedic Surgery

## 2013-04-11 VITALS — BP 148/63 | HR 64

## 2013-04-11 DIAGNOSIS — M47817 Spondylosis without myelopathy or radiculopathy, lumbosacral region: Secondary | ICD-10-CM | POA: Diagnosis not present

## 2013-04-11 DIAGNOSIS — M48061 Spinal stenosis, lumbar region without neurogenic claudication: Secondary | ICD-10-CM | POA: Diagnosis not present

## 2013-04-11 MED ORDER — IOHEXOL 180 MG/ML  SOLN
1.0000 mL | Freq: Once | INTRAMUSCULAR | Status: AC | PRN
  Administered 2013-04-11: 1 mL via EPIDURAL

## 2013-04-11 MED ORDER — METHYLPREDNISOLONE ACETATE 40 MG/ML INJ SUSP (RADIOLOG
120.0000 mg | Freq: Once | INTRAMUSCULAR | Status: AC
  Administered 2013-04-11: 120 mg via EPIDURAL

## 2013-04-17 ENCOUNTER — Other Ambulatory Visit: Payer: Medicare Other

## 2013-04-18 ENCOUNTER — Telehealth: Payer: Self-pay | Admitting: Family Medicine

## 2013-04-18 ENCOUNTER — Other Ambulatory Visit (INDEPENDENT_AMBULATORY_CARE_PROVIDER_SITE_OTHER): Payer: Medicare Other

## 2013-04-18 DIAGNOSIS — E785 Hyperlipidemia, unspecified: Secondary | ICD-10-CM

## 2013-04-18 DIAGNOSIS — E119 Type 2 diabetes mellitus without complications: Secondary | ICD-10-CM

## 2013-04-18 LAB — COMPREHENSIVE METABOLIC PANEL
Albumin: 3.9 g/dL (ref 3.5–5.2)
BUN: 27 mg/dL — ABNORMAL HIGH (ref 6–23)
CO2: 30 mEq/L (ref 19–32)
GFR: 57.75 mL/min — ABNORMAL LOW (ref >60.00)
Glucose, Bld: 137 mg/dL — ABNORMAL HIGH (ref 70–99)
Sodium: 137 mEq/L (ref 135–145)
Total Bilirubin: 0.6 mg/dL (ref 0.3–1.2)
Total Protein: 7.1 g/dL (ref 6.0–8.3)

## 2013-04-18 LAB — LIPID PANEL
Cholesterol: 158 mg/dL (ref 0–200)
VLDL: 37.2 mg/dL (ref 0.0–40.0)

## 2013-04-18 NOTE — Telephone Encounter (Signed)
Message copied by [name] on Tue Apr 18, 2013  8:42 AM ------      Message from: Ellamae Sia      Created: Tue Apr 11, 2013 11:06 AM      Regarding: Lab orders for Tuesday, 7.22.14       Labs for a f/u appt ------

## 2013-04-20 ENCOUNTER — Other Ambulatory Visit: Payer: Self-pay | Admitting: Family Medicine

## 2013-04-25 ENCOUNTER — Encounter: Payer: Self-pay | Admitting: Family Medicine

## 2013-04-25 ENCOUNTER — Ambulatory Visit (INDEPENDENT_AMBULATORY_CARE_PROVIDER_SITE_OTHER): Payer: Medicare Other | Admitting: Family Medicine

## 2013-04-25 VITALS — BP 182/80 | HR 56 | Temp 98.3°F | Wt 186.5 lb

## 2013-04-25 DIAGNOSIS — R5381 Other malaise: Secondary | ICD-10-CM

## 2013-04-25 DIAGNOSIS — R5383 Other fatigue: Secondary | ICD-10-CM

## 2013-04-25 DIAGNOSIS — E119 Type 2 diabetes mellitus without complications: Secondary | ICD-10-CM

## 2013-04-25 DIAGNOSIS — I10 Essential (primary) hypertension: Secondary | ICD-10-CM | POA: Diagnosis not present

## 2013-04-25 DIAGNOSIS — E785 Hyperlipidemia, unspecified: Secondary | ICD-10-CM | POA: Diagnosis not present

## 2013-04-25 DIAGNOSIS — L304 Erythema intertrigo: Secondary | ICD-10-CM

## 2013-04-25 DIAGNOSIS — L538 Other specified erythematous conditions: Secondary | ICD-10-CM

## 2013-04-25 LAB — HM DIABETES FOOT EXAM

## 2013-04-25 MED ORDER — NYSTATIN 100000 UNIT/GM EX CREA: TOPICAL_CREAM | Freq: Two times a day (BID) | CUTANEOUS | Status: DC

## 2013-04-25 NOTE — Assessment & Plan Note (Signed)
Poor control. Increase lisinopril ( She refuses to increase HCTZ because of urinary frequency)

## 2013-04-25 NOTE — Progress Notes (Signed)
72 year old female presents for 6 month follow up.  She has noted brittle nails in last few months. She has constipation, fatigue, some alopecia, some depression. No cold intolerance, no change in swelling.  She wishes to have thyroid tested.   Hypertension: Poor control on lisinopril HCTZ. Additional lisinopril 10 mg added. Using medication without problems or lightheadedness: Occ off and on for longtime. Occ falls.  Chest pain with exertion: None  Edema Intermittent when on feet for long time.  Short of breath: None Average home BPs: 160-190/80-100 Other issues: CAD, afib: stable per cards, last OV in 03/2013  Diabetes: On glucovance  Lab Results  Component Value Date   HGBA1C 6.3 04/18/2013  Using medications without difficulties: None  Hypoglycemic episodes:None  Hyperglycemic episodes:None  Feet problems:None  Blood Sugars averaging: 110-120  Eye exam within last year: yes   Elevated Cholesterol:well controlled on current med.  On lipitor 20 mg daily. Lab Results  Component Value Date   CHOL 158 04/18/2013   HDL 62.50 04/18/2013   LDLCALC 58 04/18/2013   LDLDIRECT 47.9 10/11/2012   TRIG 186.0* 04/18/2013   CHOLHDL 3 04/18/2013  Using medications without problems:  Muscle aches: None  Other complaints:  Moderate diet, occ walking for exercise.   Has itching and redness under pannus on front and in B groin.  Has been using OTC fungicide.  In past nystatin has helped.  Also rash on legs and arms has returned abd she will return to derm for re-assessment. Review of Systems  Constitutional: Negative for fever and fatigue.  HENT: Negative for ear pain.  Eyes: Negative for pain.  Respiratory: Negative for chest tightness and shortness of breath.  Cardiovascular: Negative for chest pain, palpitations and leg swelling.  Gastrointestinal: Negative for abdominal pain.  Genitourinary: Negative for dysuria. Urinary frequency and urgency, occ incontinence, 1-2 UOP at night, every  2-3 hours during the day.  Objective:   Physical Exam  Constitutional: Vital signs are normal. She appears well-developed and well-nourished. She is cooperative. Non-toxic appearance. She does not appear ill. No distress.  HENT:  Head: Normocephalic.  Right Ear: Hearing, tympanic membrane, external ear and ear canal normal.  Left Ear: Hearing, tympanic membrane, external ear and ear canal normal.  Nose: Nose normal.  Eyes: Conjunctivae, EOM and lids are normal. Pupils are equal, round, and reactive to light. No foreign bodies found.  Neck: Trachea normal and normal range of motion. Neck supple. Carotid bruit is not present. No mass and no thyromegaly present.  Cardiovascular: Normal rate, regular rhythm, S1 normal, S2 normal, normal heart sounds and intact distal pulses. Exam reveals no gallop.  No murmur heard.  Pulmonary/Chest: Effort normal and breath sounds normal. No respiratory distress. She has no wheezes. She has no rhonchi. She has no rales.  Abdominal: Soft. Normal appearance and bowel sounds are normal. She exhibits no distension, no fluid wave, no abdominal bruit and no mass. There is no hepatosplenomegaly. There is no tenderness. There is no rebound, no guarding and no CVA tenderness. No hernia.  Genitourinary: No breast swelling, tenderness, discharge or bleeding. Pelvic exam was performed with patient prone.  Lymphadenopathy:  She has no cervical adenopathy.  She has no axillary adenopathy.  Neurological: She is alert. She has normal strength. No cranial nerve deficit or sensory deficit.  Skin: Skin is warm, dry and intact. No rash noted.  Psychiatric: Her speech is normal and behavior is normal. Judgment normal. Her mood appears not anxious. Cognition and memory  are normal. She does not exhibit a depressed mood.   Diabetic foot exam:  Normal inspection  No skin breakdown  No calluses  Normal DP pulses  Normal sensation to light tough and monofilament  Nails normal

## 2013-04-25 NOTE — Assessment & Plan Note (Signed)
LDL at goal. Trig improved on fish oil but she is concerned restarting this may have made her rash reappear.

## 2013-04-25 NOTE — Patient Instructions (Addendum)
Increase lisinopril to 20 mg at bedtime. Continue morning lisinopril HCTZ 20/12.5 mg. Call with BP measurement in 1 week on higher dose of lisinopril. Stop at lab on your way out.  Follow up in 1 month for BP check.

## 2013-04-25 NOTE — Assessment & Plan Note (Signed)
Treat with nystatin cream.

## 2013-04-25 NOTE — Assessment & Plan Note (Signed)
Well controlled. Continue current medication.  

## 2013-04-26 ENCOUNTER — Other Ambulatory Visit: Payer: Self-pay | Admitting: Family Medicine

## 2013-04-26 MED ORDER — LISINOPRIL 20 MG PO TABS: 20.0000 mg | ORAL_TABLET | Freq: Every day | ORAL | Status: DC

## 2013-04-26 NOTE — Telephone Encounter (Signed)
Please see my note to the pt

## 2013-04-28 DIAGNOSIS — L259 Unspecified contact dermatitis, unspecified cause: Secondary | ICD-10-CM | POA: Diagnosis not present

## 2013-05-01 ENCOUNTER — Other Ambulatory Visit: Payer: Self-pay | Admitting: Family Medicine

## 2013-05-01 NOTE — Telephone Encounter (Signed)
Received refill request electronically. Last office visit 04/25/13.  See medication warning. Is it okay to refill medication?

## 2013-05-26 ENCOUNTER — Ambulatory Visit (INDEPENDENT_AMBULATORY_CARE_PROVIDER_SITE_OTHER): Payer: Medicare Other | Admitting: Family Medicine

## 2013-05-26 ENCOUNTER — Encounter: Payer: Self-pay | Admitting: Family Medicine

## 2013-05-26 VITALS — BP 140/80 | HR 81 | Temp 98.3°F | Ht 66.0 in | Wt 188.5 lb

## 2013-05-26 DIAGNOSIS — J069 Acute upper respiratory infection, unspecified: Secondary | ICD-10-CM | POA: Insufficient documentation

## 2013-05-26 DIAGNOSIS — I10 Essential (primary) hypertension: Secondary | ICD-10-CM | POA: Diagnosis not present

## 2013-05-26 NOTE — Patient Instructions (Addendum)
Start mucinex DM for cough twice daily. Call if symptoms are not  better in 7-10 days... May be due to increase in lisinopril. Call sooner if fever, shortness of breath.  Can consider change to losartan at that time.  Schedule CPX in 09/2013 with fasting labs prior

## 2013-05-26 NOTE — Assessment & Plan Note (Signed)
Improved control with decrease in stress as well as increase in lisinopril.  She may be having dose dependant SE fof dry cough, but this may also be viral infection. Will allow it to run course per viral timeline but if not improving... Change med to losartan.

## 2013-05-26 NOTE — Assessment & Plan Note (Signed)
Symptomatic care 

## 2013-05-26 NOTE — Progress Notes (Signed)
  Subjective:    Patient ID: Tonya [name], female    DOB: November 06, 1940, 72 y.o.   MRN: [id number]  HPI  72 year old female presents for 1 month follow up on HTN.  Hypertension: improved control with additional dose of lisinopril now at 40 mg, 12.5 mg of HCTZ ( she does not want to increase this due to urinary SE) BP Readings from Last 3 Encounters:  05/26/13 140/80  04/25/13 182/80  04/11/13 148/63    Using medication without problems or lightheadedness: Occ off and on for longtime. Occ falls.  Chest pain with exertion: None  Edema Intermittent when on feet for long time.  Short of breath: None  Average home BPs: 127-130/68-70, can be elevated though when she watches her grand-daughter due to stress. No longer needs to watch her all day. Other issues: CAD, afib: stable per cards, last OV in 03/2013  She has also started with a  dry cough x 1 week. Tickle in throat.  Very bothersome for her. No other symptoms. No sneeze. No face pain, chronic intermittent right ear. Some nasal congestion.  No fever, no sob, no cp.  Using zyrtec occasionally.   Review of Systems  Constitutional: Negative for fever and fatigue.  HENT: Negative for ear pain.   Eyes: Negative for pain.  Respiratory: Positive for cough. Negative for chest tightness and shortness of breath.   Cardiovascular: Negative for chest pain, palpitations and leg swelling.  Gastrointestinal: Negative for abdominal pain.  Genitourinary: Negative for dysuria.       Objective:   Physical Exam  Constitutional: Vital signs are normal. She appears well-developed and well-nourished. She is cooperative.  Non-toxic appearance. She does not appear ill. No distress.  HENT:  Head: Normocephalic.  Right Ear: Hearing, tympanic membrane, external ear and ear canal normal. Tympanic membrane is not erythematous, not retracted and not bulging.  Left Ear: Hearing, tympanic membrane, external ear and ear canal normal. Tympanic membrane is  not erythematous, not retracted and not bulging.  Nose: No mucosal edema. Right sinus exhibits no maxillary sinus tenderness and no frontal sinus tenderness. Left sinus exhibits maxillary sinus tenderness. Left sinus exhibits no frontal sinus tenderness.  Mouth/Throat: Uvula is midline, oropharynx is clear and moist and mucous membranes are normal.  Eyes: Conjunctivae, EOM and lids are normal. Pupils are equal, round, and reactive to light. Lids are everted and swept, no foreign bodies found.  Neck: Trachea normal and normal range of motion. Neck supple. Carotid bruit is not present. No mass and no thyromegaly present.  Cardiovascular: Normal rate, regular rhythm, S1 normal, S2 normal, normal heart sounds, intact distal pulses and normal pulses.  Exam reveals no gallop and no friction rub.   No murmur heard. Pulmonary/Chest: Effort normal and breath sounds normal. Not tachypneic. No respiratory distress. She has no decreased breath sounds. She has no wheezes. She has no rhonchi. She has no rales.  Abdominal: Soft. Normal appearance and bowel sounds are normal. There is no tenderness.  Neurological: She is alert.  Skin: Skin is warm, dry and intact. No rash noted.  Psychiatric: Her speech is normal and behavior is normal. Judgment and thought content normal. Her mood appears not anxious. Cognition and memory are normal. She does not exhibit a depressed mood.          Assessment & Plan:

## 2013-05-28 ENCOUNTER — Other Ambulatory Visit: Payer: Self-pay | Admitting: Family Medicine

## 2013-05-28 ENCOUNTER — Encounter: Payer: Self-pay | Admitting: Family Medicine

## 2013-05-30 MED ORDER — LOSARTAN POTASSIUM-HCTZ 100-12.5 MG PO TABS: 1.0000 | ORAL_TABLET | Freq: Every day | ORAL | Status: DC

## 2013-05-30 NOTE — Telephone Encounter (Signed)
Diltiazem initially prescribed by  and  followed by cardiology. Drug drug interaction monitored by cardiology. Okay to refill.

## 2013-05-30 NOTE — Telephone Encounter (Signed)
See Drug-Drug warning.  Ok to refill?  Last office visit 05/26/2013.

## 2013-06-01 ENCOUNTER — Encounter: Payer: Self-pay | Admitting: Family Medicine

## 2013-06-01 ENCOUNTER — Ambulatory Visit (INDEPENDENT_AMBULATORY_CARE_PROVIDER_SITE_OTHER): Payer: Medicare Other | Admitting: Family Medicine

## 2013-06-01 VITALS — BP 140/60 | HR 76 | Temp 98.5°F | Ht 66.0 in

## 2013-06-01 DIAGNOSIS — H6690 Otitis media, unspecified, unspecified ear: Secondary | ICD-10-CM | POA: Diagnosis not present

## 2013-06-01 DIAGNOSIS — H1089 Other conjunctivitis: Secondary | ICD-10-CM

## 2013-06-01 DIAGNOSIS — A499 Bacterial infection, unspecified: Secondary | ICD-10-CM | POA: Diagnosis not present

## 2013-06-01 DIAGNOSIS — H109 Unspecified conjunctivitis: Secondary | ICD-10-CM

## 2013-06-01 DIAGNOSIS — H6691 Otitis media, unspecified, right ear: Secondary | ICD-10-CM

## 2013-06-01 MED ORDER — MOXIFLOXACIN HCL (2X DAY) 0.5 % OP SOLN: 1.0000 [drp] | Freq: Two times a day (BID) | OPHTHALMIC | Status: AC

## 2013-06-01 MED ORDER — AMOXICILLIN 500 MG PO CAPS: 1000.0000 mg | ORAL_CAPSULE | Freq: Two times a day (BID) | ORAL | Status: DC

## 2013-06-01 NOTE — Progress Notes (Signed)
Therapist, music at [facility] [place] [address] Phone: [phone] Fax: [phone]  Date:  06/01/2013   Name:  Tonya [name]   DOB:  02-Nov-1940   MRN:  [id number] Gender: female Age: 72 y.o.  Primary Physician:  [name], MD  Evaluating MD: Owens Loffler, MD   Chief Complaint: Belepharitis   History of Present Illness:  Tonya [name] is a 72 y.o. pleasant patient who presents with the following:  Saw Dr. B last week, and was coughing a lot and took off of the lisinopril. Mucous over it so could not see.   Past couple of days, the patient has had some severe redness and crusting on her left thigh. No known trauma. She has not done anything more she thinks she can scratch her eye. Left eyelid is mildly puffy.  She is also having some pain on her right ear. She has had some cold symptoms recently.  Patient Active Problem List   Diagnosis Date Noted  . Viral URI with cough 05/26/2013  . Intertrigo 04/25/2013  . Urinary urgency 10/18/2012  . ATRIAL FIBRILLATION, PAROXYSMAL 10/17/2010  . ALLERGIC RHINITIS 01/23/2009  . ADENOCARCINOMA, COLON, CECUM 10/05/2008  . CORONARY ARTERY DISEASE 11/23/2007  . HYPERLIPIDEMIA 09/19/2007  . ANXIETY 09/19/2007  . DEPRESSION 09/19/2007  . GERD 09/19/2007  . DIABETES MELLITUS, TYPE II 03/21/2007  . SYNDROME, CARPAL TUNNEL 03/21/2007  . HYPERTENSION 03/21/2007  . IBS 03/21/2007  . Osteoarth NOS-Unspec 03/21/2007    Past Medical History  Diagnosis Date  . Osteoarthritis   . Edema, peripheral   . CAD (coronary artery disease)     s/p BMS to LAD  . Breast pain     left  . Hyperlipidemia   . Hypertension   . Sinusitis     acute- NOS  . Allergic rhinitis   . Adenocarcinoma, colon   . Anemia   . Sciatica   . Cystitis, acute   . Chronic diarrhea   . GERD (gastroesophageal reflux disease)   . Depression   . Anxiety   . Osteoarthritis   . IBS (irritable bowel syndrome)   . Degenerative disk  disease   . Carpal tunnel syndrome   . Diabetes mellitus, type 2   . History of pneumonia   . Nocturia     Past Surgical History  Procedure Laterality Date  . Knee arthroscopy      bilateral  . Cholecystectomy    . Cardiac catheterization  12/10    LAD stent patent. Insignificant CAD, otherwise EF 60-65%  . Partial colectomy  11/2008    right, for adenocarcinoma  . Replacement total knee  [id number]    right, complicated by hemarthrosis   . Hematoma evacuation      after knee replacement  . Total knee revision  03/08/2012    Procedure: TOTAL KNEE REVISION;  Surgeon: Garald Balding, MD;  Location: [facility];  Service: Orthopedics;  Laterality: Right;  removal total knee hardware and placement of antibiotic cement spacer and antibiotic beads  . Carpal tunnel release    . Total knee revision  05/17/2012    Procedure: TOTAL KNEE REVISION;  Surgeon: Garald Balding, MD;  Location: [facility];  Service: Orthopedics;  Laterality: Right;  right total knee revision, removal of antibiotic spacer    History   Social History  . Marital Status: Divorced    Spouse Name: N/A    Number of Children: 3  . Years of Education:  N/A   Occupational History  . retired   .     Social History Main Topics  . Smoking status: Never Smoker   . Smokeless tobacco: Never Used  . Alcohol Use: No  . Drug Use: No  . Sexual Activity: Not on file   Other Topics Concern  . Not on file   Social History Narrative   No regular exercise, limited due to knees   Diet- addicted to sweets, some fruit and veggies, water daily.   Has NO  living will or HCPOA, full code (reviewed 2014)    Family History  Problem Relation Age of Onset  . Heart disease Mother     massive MI age 67  . Heart attack Mother   . Heart disease Father     Massive MI age 37  . Prostate cancer Father   . Colon cancer Neg Hx     Allergies  Allergen Reactions  . Amoxicillin-Pot Clavulanate Swelling    Augmentin per patient, tongue  swelling    Medication list has been reviewed and updated.  Outpatient Prescriptions Prior to Visit  Medication Sig Dispense Refill  . aspirin 81 MG tablet Take 81 mg by mouth daily.      Marland Kitchen atorvastatin (LIPITOR) 20 MG tablet TAKE 1 TABLET (20 MG TOTAL) BY MOUTH DAILY.  90 tablet  3  . calcium-vitamin D (OSCAL WITH D 500-200) 500-200 MG-UNIT per tablet Take 1 tablet by mouth 2 (two) times daily.        . carvedilol (COREG) 12.5 MG tablet Take 1 tablet (12.5 mg total) by mouth 2 (two) times daily with a meal.  180 tablet  1  . cholestyramine light (PREVALITE) 4 G packet TAKE 1 PACKET (4 G TOTAL) BY MOUTH 2 (TWO) TIMES DAILY WITH A MEAL.  180 packet  3  . citalopram (CELEXA) 40 MG tablet TAKE 1 TABLET BY MOUTH EVERY DAY  90 tablet  0  . diltiazem (CARDIZEM CD) 240 MG 24 hr capsule TAKE ONE CAPSULE BY MOUTH EVERY DAY  90 capsule  1  . ferrous sulfate 325 (65 FE) MG EC tablet Take 325 mg by mouth 2 (two) times daily.      Marland Kitchen glyBURIDE-metformin (GLUCOVANCE) 1.25-250 MG per tablet TAKE 1 TABLET BY MOUTH TWICE A DAY  180 tablet  1  . losartan-hydrochlorothiazide (HYZAAR) 100-12.5 MG per tablet Take 1 tablet by mouth daily.  90 tablet  3  . Multiple Vitamin (MULTIVITAMIN WITH MINERALS) TABS Take 1 tablet by mouth daily.      . nabumetone (RELAFEN) 750 MG tablet TAKE 1 TABLET DAILY ALTERNATING WITH 1 TABLET TWICE DAILY AS DIRECTED  60 tablet  4  . nystatin cream (MYCOSTATIN) Apply topically 2 (two) times daily.  30 g  1  . omeprazole (PRILOSEC) 20 MG capsule TAKE 2 CAPSULES BY MOUTH EVERY DAY  180 capsule  1  . potassium chloride SA (KLOR-CON M20) 20 MEQ tablet TAKE 1 TABLET BY MOUTH TWICE A DAY  180 tablet  1   No facility-administered medications prior to visit.    Review of Systems:  ROS: GEN: Acute illness details above GI: Tolerating PO intake GU: maintaining adequate hydration and urination Pulm: No SOB Interactive and getting along well at home.  Otherwise, ROS is as per the HPI.    Physical Examination: BP 140/60  Pulse 76  Temp(Src) 98.5 F (36.9 C) (Oral)  Ht 5\' 6"  (1.676 m)  Ideal Body Weight: Weight in (lb) to have  BMI = 25: 154.6   Gen: WDWN, NAD; A & O x3, cooperative. Pleasant.Globally Non-toxic HEENT: Normocephalic and atraumatic. PERRLA, EOMI. Throat clear, w/o exudate, R TM bulging and poor landmarks, L TM - good landmarks, No fluid present. rhinnorhea.  MMM L eye: Conjunctiva is quite injected and reddened/pink in character. There is no crusting currently. Frontal sinuses: NT Max sinuses: NT NECK: Anterior cervical  LAD is absent CV: RRR, No M/G/R, cap refill <2 sec PULM: Breathing comfortably in no respiratory distress. no wheezing, crackles, rhonchi EXT: No c/c/e PSYCH: Friendly, good eye contact MSK: Nml gait    Assessment and Plan:  Bacterial conjunctivitis of left eye  Otitis media, right  Mostly concerned about the left. I gave her some moxifloxacin drops, and strongly cautioned her about if this worsens, she would need to seek urgent evaluation or ophthalmological evaluation.  New onset otitis, amoxicillin.  Orders Today:  No orders of the defined types were placed in this encounter.    Updated Medication List: (Includes new medications, updates to list, dose adjustments) Meds ordered this encounter  Medications  . cetirizine (ZYRTEC) 10 MG tablet    Sig: Take 10 mg by mouth daily as needed for allergies.  Marland Kitchen amoxicillin (AMOXIL) 500 MG capsule    Sig: Take 2 capsules (1,000 mg total) by mouth 2 (two) times daily.    Dispense:  40 capsule    Refill:  0  . Moxifloxacin HCl 0.5 % SOLN    Sig: Apply 1 drop to eye 2 (two) times daily.    Dispense:  1 Bottle    Refill:  0    Medications Discontinued: There are no discontinued medications.    Signed, [name]. Verdine Grenfell, MD 06/01/2013 2:57 PM

## 2013-06-14 DIAGNOSIS — H6980 Other specified disorders of Eustachian tube, unspecified ear: Secondary | ICD-10-CM | POA: Diagnosis not present

## 2013-06-14 DIAGNOSIS — H6500 Acute serous otitis media, unspecified ear: Secondary | ICD-10-CM | POA: Diagnosis not present

## 2013-06-14 DIAGNOSIS — H905 Unspecified sensorineural hearing loss: Secondary | ICD-10-CM | POA: Diagnosis not present

## 2013-06-14 DIAGNOSIS — H908 Mixed conductive and sensorineural hearing loss, unspecified: Secondary | ICD-10-CM | POA: Diagnosis not present

## 2013-06-15 ENCOUNTER — Other Ambulatory Visit: Payer: Self-pay | Admitting: Family Medicine

## 2013-06-17 DIAGNOSIS — Z23 Encounter for immunization: Secondary | ICD-10-CM | POA: Diagnosis not present

## 2013-06-19 ENCOUNTER — Telehealth: Payer: Self-pay | Admitting: *Deleted

## 2013-06-19 NOTE — Telephone Encounter (Signed)
Received fax from [facility]-Medication Therapy Management-Quarterly Review for Tonya [name].  Placed in Dr. Rometta Emery in box to review.

## 2013-06-20 NOTE — Telephone Encounter (Signed)
Noted  

## 2013-06-25 ENCOUNTER — Encounter: Payer: Self-pay | Admitting: Family Medicine

## 2013-06-28 ENCOUNTER — Ambulatory Visit: Payer: Medicare Other

## 2013-06-30 DIAGNOSIS — M1710 Unilateral primary osteoarthritis, unspecified knee: Secondary | ICD-10-CM | POA: Diagnosis not present

## 2013-07-14 DIAGNOSIS — M1710 Unilateral primary osteoarthritis, unspecified knee: Secondary | ICD-10-CM | POA: Diagnosis not present

## 2013-07-21 DIAGNOSIS — M1710 Unilateral primary osteoarthritis, unspecified knee: Secondary | ICD-10-CM | POA: Diagnosis not present

## 2013-07-25 ENCOUNTER — Telehealth: Payer: Self-pay

## 2013-07-25 NOTE — Telephone Encounter (Signed)
I don't really see how request alternative med is different.. I have prepared a prescription for met/gly that islisted as generic.. Is this what they want? Please call to verify.

## 2013-07-25 NOTE — Telephone Encounter (Signed)
Pt left letter from [facility] about change of formulary in 2015;[employer] will no longer cover Glyb/metformin tab 1.25-250; suggest alternative med that is covered Glipizide/Metformin HCL. Letter in Dr Rometta Emery in box for review.

## 2013-07-28 ENCOUNTER — Other Ambulatory Visit: Payer: Self-pay | Admitting: Family Medicine

## 2013-07-28 DIAGNOSIS — M1710 Unilateral primary osteoarthritis, unspecified knee: Secondary | ICD-10-CM | POA: Diagnosis not present

## 2013-08-10 ENCOUNTER — Other Ambulatory Visit: Payer: Self-pay | Admitting: Orthopaedic Surgery

## 2013-08-10 DIAGNOSIS — M4800 Spinal stenosis, site unspecified: Secondary | ICD-10-CM

## 2013-08-11 ENCOUNTER — Telehealth: Payer: Self-pay | Admitting: *Deleted

## 2013-08-11 NOTE — Telephone Encounter (Signed)
Received fax from [facility] stating Tonya [name] had requested Diabetic Testing Supplies from them.  I called to verify this with Tonya [name].  She states she gets her Diabetic Testing Supplies from [facility].  Form faxed back to [facility] denying the request.

## 2013-08-15 ENCOUNTER — Ambulatory Visit
Admission: RE | Admit: 2013-08-15 | Discharge: 2013-08-15 | Disposition: A | Discharge status: Home / Self Care | Payer: Medicare Other | Source: Ambulatory Visit | Attending: Orthopaedic Surgery | Admitting: Orthopaedic Surgery

## 2013-08-15 DIAGNOSIS — M4800 Spinal stenosis, site unspecified: Secondary | ICD-10-CM

## 2013-08-15 DIAGNOSIS — M545 Low back pain: Secondary | ICD-10-CM | POA: Diagnosis not present

## 2013-08-15 MED ORDER — IOHEXOL 180 MG/ML  SOLN
1.0000 mL | Freq: Once | INTRAMUSCULAR | Status: AC | PRN
  Administered 2013-08-15: 1 mL via EPIDURAL

## 2013-08-15 MED ORDER — METHYLPREDNISOLONE ACETATE 40 MG/ML INJ SUSP (RADIOLOG
120.0000 mg | Freq: Once | INTRAMUSCULAR | Status: AC
  Administered 2013-08-15: 120 mg via EPIDURAL

## 2013-08-17 ENCOUNTER — Other Ambulatory Visit: Payer: Self-pay

## 2013-08-17 DIAGNOSIS — Z1231 Encounter for screening mammogram for malignant neoplasm of breast: Secondary | ICD-10-CM

## 2013-09-11 ENCOUNTER — Other Ambulatory Visit: Payer: Self-pay | Admitting: Family Medicine

## 2013-09-22 ENCOUNTER — Ambulatory Visit
Admission: RE | Admit: 2013-09-22 | Discharge: 2013-09-22 | Disposition: A | Discharge status: Home / Self Care | Payer: Medicare Other | Source: Ambulatory Visit

## 2013-09-22 DIAGNOSIS — Z1231 Encounter for screening mammogram for malignant neoplasm of breast: Secondary | ICD-10-CM | POA: Diagnosis not present

## 2013-09-26 ENCOUNTER — Other Ambulatory Visit: Payer: Self-pay | Admitting: Gastroenterology

## 2013-09-29 ENCOUNTER — Other Ambulatory Visit: Payer: Self-pay | Admitting: Orthopaedic Surgery

## 2013-09-29 ENCOUNTER — Ambulatory Visit
Admission: RE | Admit: 2013-09-29 | Discharge: 2013-09-29 | Disposition: A | Discharge status: Home / Self Care | Payer: Medicare Other | Source: Ambulatory Visit | Attending: Orthopaedic Surgery | Admitting: Orthopaedic Surgery

## 2013-09-29 VITALS — BP 169/87 | HR 66

## 2013-09-29 DIAGNOSIS — M549 Dorsalgia, unspecified: Secondary | ICD-10-CM

## 2013-09-29 DIAGNOSIS — IMO0002 Reserved for concepts with insufficient information to code with codable children: Secondary | ICD-10-CM | POA: Diagnosis not present

## 2013-09-29 DIAGNOSIS — M545 Low back pain, unspecified: Secondary | ICD-10-CM | POA: Diagnosis not present

## 2013-09-29 MED ORDER — METHYLPREDNISOLONE ACETATE 40 MG/ML INJ SUSP (RADIOLOG
120.0000 mg | Freq: Once | INTRAMUSCULAR | Status: AC
  Administered 2013-09-29: 120 mg via EPIDURAL

## 2013-09-29 MED ORDER — IOHEXOL 180 MG/ML  SOLN
1.0000 mL | Freq: Once | INTRAMUSCULAR | Status: AC | PRN
  Administered 2013-09-29: 1 mL via EPIDURAL

## 2013-10-05 ENCOUNTER — Other Ambulatory Visit: Payer: Self-pay | Admitting: Family Medicine

## 2013-10-05 NOTE — Telephone Encounter (Signed)
Last office visit 06/01/2013 with Dr. Lorelei Pont.  Ok to refill?

## 2013-10-09 ENCOUNTER — Other Ambulatory Visit: Payer: Self-pay | Admitting: Family Medicine

## 2013-10-13 ENCOUNTER — Other Ambulatory Visit (INDEPENDENT_AMBULATORY_CARE_PROVIDER_SITE_OTHER): Payer: Medicare Other

## 2013-10-13 ENCOUNTER — Telehealth: Payer: Self-pay | Admitting: Family Medicine

## 2013-10-13 DIAGNOSIS — E785 Hyperlipidemia, unspecified: Secondary | ICD-10-CM

## 2013-10-13 DIAGNOSIS — E119 Type 2 diabetes mellitus without complications: Secondary | ICD-10-CM | POA: Diagnosis not present

## 2013-10-13 LAB — COMPREHENSIVE METABOLIC PANEL
ALBUMIN: 3.9 g/dL (ref 3.5–5.2)
ALT: 32 U/L (ref 0–35)
AST: 24 U/L (ref 0–37)
Alkaline Phosphatase: 54 U/L (ref 39–117)
BILIRUBIN TOTAL: 0.9 mg/dL (ref 0.3–1.2)
BUN: 16 mg/dL (ref 6–23)
CALCIUM: 9.1 mg/dL (ref 8.4–10.5)
CHLORIDE: 101 meq/L (ref 96–112)
CO2: 30 mEq/L (ref 19–32)
Creatinine, Ser: 0.9 mg/dL (ref 0.4–1.2)
GFR: 62.71 mL/min (ref >60.00)
GLUCOSE: 131 mg/dL — AB (ref 70–99)
POTASSIUM: 4.8 meq/L (ref 3.5–5.1)
Sodium: 139 mEq/L (ref 135–145)
TOTAL PROTEIN: 6.8 g/dL (ref 6.0–8.3)

## 2013-10-13 LAB — LIPID PANEL
CHOLESTEROL: 150 mg/dL (ref 0–200)
HDL: 61.8 mg/dL (ref >39.00)
LDL Cholesterol: 53 mg/dL (ref 0–99)
Total CHOL/HDL Ratio: 2
Triglycerides: 177 mg/dL — ABNORMAL HIGH (ref 0.0–149.0)
VLDL: 35.4 mg/dL (ref 0.0–40.0)

## 2013-10-13 LAB — HEMOGLOBIN A1C: Hgb A1c MFr Bld: 7 % — ABNORMAL HIGH (ref 4.6–6.5)

## 2013-10-13 NOTE — Telephone Encounter (Signed)
Message copied by [name] on Fri Oct 13, 2013  8:25 AM ------      Message from: Ellamae Sia      Created: Tue Oct 10, 2013  5:30 PM      Regarding: Lab orders for Friday, 1.16.15       Patient is scheduled for CPX labs, please order future labs, Thanks , Tonya [name]       ------

## 2013-10-16 ENCOUNTER — Telehealth: Payer: Self-pay

## 2013-10-16 NOTE — Telephone Encounter (Signed)
Relevant patient education assigned to patient using Emmi. ° °

## 2013-10-18 ENCOUNTER — Other Ambulatory Visit: Payer: Self-pay | Admitting: Family Medicine

## 2013-10-20 ENCOUNTER — Ambulatory Visit (INDEPENDENT_AMBULATORY_CARE_PROVIDER_SITE_OTHER): Payer: Medicare Other | Admitting: Family Medicine

## 2013-10-20 ENCOUNTER — Encounter: Payer: Self-pay | Admitting: Family Medicine

## 2013-10-20 VITALS — BP 150/80 | HR 71 | Temp 98.3°F | Ht 66.0 in | Wt 182.0 lb

## 2013-10-20 DIAGNOSIS — Z0000 Encounter for general adult medical examination without abnormal findings: Secondary | ICD-10-CM | POA: Diagnosis not present

## 2013-10-20 MED ORDER — GLIPIZIDE-METFORMIN HCL 2.5-250 MG PO TABS: 2.0000 | ORAL_TABLET | Freq: Two times a day (BID) | ORAL | Status: DC

## 2013-10-20 NOTE — Patient Instructions (Signed)
Look into setting up living will and HCPOA.    Continue working on healthy eating and exercise as well as weight loss.  Schedule follow up in 6 months with fasting labs prior.  Diabetes and Standards of Medical Care  Diabetes is complicated. You may find that your diabetes team includes a dietitian, nurse, diabetes educator, eye doctor, and more. To help everyone know what is going on and to help you get the care you deserve, the following schedule of care was developed to help keep you on track. Below are the tests, exams, vaccines, medicines, education, and plans you will need. HbA1c test This test shows how well you have controlled your glucose over the past 2 3 months. It is used to see if your diabetes management plan needs to be adjusted.   It is performed at least 2 times a year if you are meeting treatment goals.  It is performed 4 times a year if therapy has changed or if you are not meeting treatment goals. Blood pressure test  This test is performed at every routine medical visit. The goal is less than 140/90 mmHg for most people, but 130/80 mmHg in some cases. Ask your health care provider about your goal. Dental exam  Follow up with the dentist regularly. Eye exam  If you are diagnosed with type 1 diabetes as a child, get an exam upon reaching the age of 59 years or older and have had diabetes for 3 5 years. Yearly eye exams are recommended after that initial eye exam.  If you are diagnosed with type 1 diabetes as an adult, get an exam within 5 years of diagnosis and then yearly.  If you are diagnosed with type 2 diabetes, get an exam as soon as possible after the diagnosis and then yearly. Foot care exam  Visual foot exams are performed at every routine medical visit. The exams check for cuts, injuries, or other problems with the feet.  A comprehensive foot exam should be done yearly. This includes visual inspection as well as assessing foot pulses and testing for loss of  sensation.  Check your feet nightly for cuts, injuries, or other problems with your feet. Tell your health care provider if anything is not healing. Kidney function test (urine microalbumin)  This test is performed once a year.  Type 1 diabetes: The first test is performed 5 years after diagnosis.  Type 2 diabetes: The first test is performed at the time of diagnosis.  A serum creatinine and estimated glomerular filtration rate (eGFR) test is done once a year to assess the level of chronic kidney disease (CKD), if present. Lipid profile (cholesterol, HDL, LDL, triglycerides)  Performed every 5 years for most people.  The goal for LDL is less than 100 mg/dL. If you are at high risk, the goal is less than 70 mg/dL.  The goal for HDL is 40 mg/dL 50 mg/dL for men and 50 mg/dL 60 mg/dL for women. An HDL cholesterol of 60 mg/dL or higher gives some protection against heart disease.  The goal for triglycerides is less than 150 mg/dL. Influenza vaccine, pneumococcal vaccine, and hepatitis B vaccine  The influenza vaccine is recommended yearly.  The pneumococcal vaccine is generally given once in a lifetime. However, there are some instances when another vaccination is recommended. Check with your health care provider.  The hepatitis B vaccine is also recommended for adults with diabetes. Diabetes self-management education  Education is recommended at diagnosis and ongoing as needed. Treatment  plan  Your treatment plan is reviewed at every medical visit. Document Released: 07/12/2009 Document Revised: 05/17/2013 Document Reviewed: 02/14/2013 [facility] Patient Information 2014 [employer].

## 2013-10-20 NOTE — Progress Notes (Signed)
Pre-visit discussion using our clinic review tool. No additional management support is needed unless otherwise documented below in the visit note.  

## 2013-10-20 NOTE — Progress Notes (Signed)
I have personally reviewed the Medicare Annual Wellness questionnaire and have noted  1. The patient's medical and social history  2. Their use of alcohol, tobacco or illicit drugs  3. Their current medications and supplements  4. The patient's functional ability including ADL's, fall risks, home safety risks and hearing or visual  impairment.  5. Diet and physical activities  6. Evidence for depression or mood disorders  The patients weight, height, BMI and visual acuity have been recorded in the chart  I have made referrals, counseling and provided education to the patient based review of the above and I have provided the pt with a written personalized care plan for preventive services.   Hypertension: Well controlled  Using medication without problems or lightheadedness: Occ off and on for longtime. Occ falls.  Chest pain with exertion: None  Edema Intermittent when on feet for long time.  Short of breath: Mild  Average home BPs: 130-145/60-75  Other issues: CAD, afib: stable per cards, last OV in 09/2010   Diabetes: On glucovance, worsened control since last check, steroid injection 09/29/2012, as well as 2 months priro.  Need to change to glyburide/metformin for insurance. Lab Results  Component Value Date   HGBA1C 7.0* 10/13/2013  Using medications without difficulties: None  Hypoglycemic episodes:None  Hyperglycemic episodes:None , but elevated after steroids Feet problems:see below, no ulcer Blood Sugars averaging: 118  Eye exam within last year: 12/12/10   Elevated Cholesterol:well controlled on current med.  Lab Results  Component Value Date   CHOL 150 10/13/2013   HDL 61.80 10/13/2013   LDLCALC 53 10/13/2013   LDLDIRECT 47.9 10/11/2012   TRIG 177.0* 10/13/2013   CHOLHDL 2 10/13/2013  Using medications without problems:  Muscle aches: None  Other complaints:  Moderate diet, occ walking for exercise.   Right knee pain... Has had 2 knee operation, replacement in last year.  No further options for surgical treatment.   Left knee now with pain. Low back and left hip Seeing Dr. Durward Fortes.  She plans to see him regarding right heel pain.  Review of Systems  Constitutional: Negative for fever and fatigue.  HENT: Negative for ear pain.  Eyes: Negative for pain.  Respiratory: Negative for chest tightness and shortness of breath.  Cardiovascular: Negative for chest pain, palpitations and leg swelling.  Gastrointestinal: Negative for abdominal pain.  Genitourinary: Negative for dysuria. Urinary frequency and urgency, occ incontinence, 1-2 UOP at night, every 2-3 hours during the day.  Objective:   Physical Exam  Constitutional: Vital signs are normal. She appears well-developed and well-nourished. She is cooperative. Non-toxic appearance. She does not appear ill. No distress.  HENT:  Head: Normocephalic.  Right Ear: Hearing, tympanic membrane, external ear and ear canal normal.  Left Ear: Hearing, tympanic membrane, external ear and ear canal normal.  Nose: Nose normal.  Eyes: Conjunctivae, EOM and lids are normal. Pupils are equal, round, and reactive to light. No foreign bodies found.  Neck: Trachea normal and normal range of motion. Neck supple. Carotid bruit is not present. No mass and no thyromegaly present.  Cardiovascular: Normal rate, regular rhythm, S1 normal, S2 normal, normal heart sounds and intact distal pulses. Exam reveals no gallop.  No murmur heard.  Pulmonary/Chest: Effort normal and breath sounds normal. No respiratory distress. She has no wheezes. She has no rhonchi. She has no rales.  Abdominal: Soft. Normal appearance and bowel sounds are normal. She exhibits no distension, no fluid wave, no abdominal bruit and no mass. There  is no hepatosplenomegaly. There is no tenderness. There is no rebound, no guarding and no CVA tenderness. No hernia.  Genitourinary: No breast swelling, tenderness, discharge or bleeding. Pelvic exam was performed with  patient prone.  Lymphadenopathy:  She has no cervical adenopathy.  She has no axillary adenopathy.  Neurological: She is alert. She has normal strength. No cranial nerve deficit or sensory deficit.  Skin: Skin is warm, dry and intact. No rash noted.  Psychiatric: Her speech is normal and behavior is normal. Judgment normal. Her mood appears not anxious. Cognition and memory are normal. She does not exhibit a depressed mood.   Diabetic foot exam:   pain from plantar fasciitis... Tender at insertion.. info on exercises given. Normal inspection  No skin breakdown  No calluses  Normal DP pulses  Normal sensation to light tough and monofilament  Nails normal  Assessment & Plan:   Annual Medicare Wellness: The patient's preventative maintenance and recommended screening tests for an annual wellness exam were reviewed in full today.  Brought up to date unless services declined.  Counselled on the importance of diet, exercise, and its role in overall health and mortality.  The patient's FH and SH was reviewed, including their home life, tobacco status, and drug and alcohol status.   DEXA due, last nml 2009.Marland Kitchen She did not get due to told it was not covered. Mammogram nml in 08/2013 Uptodate with colonoscopy with hx of colon cancer.. Planning repeat in 5 years. PAP/DVE not indicated at this age. No vaginal bleeding.  Nonsmoker  Vaccine: uptodate, not interested in shingles vaccine

## 2013-10-23 DIAGNOSIS — M722 Plantar fascial fibromatosis: Secondary | ICD-10-CM | POA: Diagnosis not present

## 2013-10-26 ENCOUNTER — Other Ambulatory Visit: Payer: Self-pay | Admitting: Family Medicine

## 2013-11-20 ENCOUNTER — Other Ambulatory Visit: Payer: Self-pay | Admitting: Family Medicine

## 2013-12-02 ENCOUNTER — Encounter: Payer: Self-pay | Admitting: Family Medicine

## 2013-12-08 ENCOUNTER — Ambulatory Visit (INDEPENDENT_AMBULATORY_CARE_PROVIDER_SITE_OTHER): Payer: Medicare Other | Admitting: Family Medicine

## 2013-12-08 ENCOUNTER — Encounter: Payer: Self-pay | Admitting: Family Medicine

## 2013-12-08 VITALS — BP 140/86 | HR 70 | Temp 98.1°F | Ht 66.0 in | Wt 183.2 lb

## 2013-12-08 DIAGNOSIS — L97509 Non-pressure chronic ulcer of other part of unspecified foot with unspecified severity: Secondary | ICD-10-CM | POA: Diagnosis not present

## 2013-12-08 DIAGNOSIS — E1169 Type 2 diabetes mellitus with other specified complication: Secondary | ICD-10-CM | POA: Diagnosis not present

## 2013-12-08 DIAGNOSIS — E11621 Type 2 diabetes mellitus with foot ulcer: Secondary | ICD-10-CM

## 2013-12-08 NOTE — Assessment & Plan Note (Signed)
Pt with healing ulcer/blister. Has callus. Recommend moleskin to decrease rubbing. No sign of infection. Rx given for diabetic shoes.

## 2013-12-08 NOTE — Progress Notes (Signed)
   Subjective:    Patient ID: Tonya [name], female    DOB: Jul 13, 1941, 73 y.o.   MRN: [id number]  HPI  73 year old female with history of well controlled DM presents with  new onset blister on side of 5th digit in left foot. Noted firt 3 weeks ago, slow healing. She has been walking a lot shopping. Wearing bandaid on 4th and 5th digit..hospital has helped it and now it has dried out some. No associated redness or swelling.   With walking she is having more foot pain.  Seeing ortho for plantar fasciitis.  Doing exercise      Lab Results  Component Value Date   HGBA1C 7.0* 10/13/2013      Review of Systems  Constitutional: Negative for fever and fatigue.  HENT: Negative for ear pain.   Eyes: Negative for pain.  Respiratory: Negative for chest tightness and shortness of breath.   Cardiovascular: Negative for chest pain, palpitations and leg swelling.  Gastrointestinal: Negative for abdominal pain.  Genitourinary: Negative for dysuria.       Objective:   Physical Exam  Constitutional: Vital signs are normal. She appears well-developed and well-nourished. She is cooperative.  Non-toxic appearance. She does not appear ill. No distress.  HENT:  Head: Normocephalic.  Right Ear: Hearing, tympanic membrane, external ear and ear canal normal. Tympanic membrane is not erythematous, not retracted and not bulging.  Left Ear: Hearing, tympanic membrane, external ear and ear canal normal. Tympanic membrane is not erythematous, not retracted and not bulging.  Nose: No mucosal edema or rhinorrhea. Right sinus exhibits no maxillary sinus tenderness and no frontal sinus tenderness. Left sinus exhibits no maxillary sinus tenderness and no frontal sinus tenderness.  Mouth/Throat: Uvula is midline, oropharynx is clear and moist and mucous membranes are normal.  Eyes: Conjunctivae, EOM and lids are normal. Pupils are equal, round, and reactive to light. Lids are everted and swept, no foreign  bodies found.  Neck: Trachea normal and normal range of motion. Neck supple. Carotid bruit is not present. No mass and no thyromegaly present.  Cardiovascular: Normal rate, regular rhythm, S1 normal, S2 normal, normal heart sounds, intact distal pulses and normal pulses.  Exam reveals no gallop and no friction rub.   No murmur heard. Pulmonary/Chest: Effort normal and breath sounds normal. Not tachypneic. No respiratory distress. She has no decreased breath sounds. She has no wheezes. She has no rhonchi. She has no rales.  Abdominal: Soft. Normal appearance and bowel sounds are normal. There is no tenderness.  Neurological: She is alert.  Skin: Skin is warm, dry and intact. No rash noted.  5th digit left foot angles inward.Marland Kitchen Healing ulcer and surrounding pre-ulcerative callus  Psychiatric: Her speech is normal and behavior is normal. Judgment and thought content normal. Her mood appears not anxious. Cognition and memory are normal. She does not exhibit a depressed mood.          Assessment & Plan:

## 2013-12-08 NOTE — Patient Instructions (Signed)
Apply moleskin to the are to let it heal. It will take weeks. Call if redness, heat, increasing pain. Get diabetic shoes.

## 2013-12-08 NOTE — Progress Notes (Signed)
Pre visit review using our clinic review tool, if applicable. No additional management support is needed unless otherwise documented below in the visit note. 

## 2013-12-27 DIAGNOSIS — H2589 Other age-related cataract: Secondary | ICD-10-CM | POA: Diagnosis not present

## 2013-12-27 DIAGNOSIS — H43819 Vitreous degeneration, unspecified eye: Secondary | ICD-10-CM | POA: Diagnosis not present

## 2013-12-27 DIAGNOSIS — E1139 Type 2 diabetes mellitus with other diabetic ophthalmic complication: Secondary | ICD-10-CM | POA: Diagnosis not present

## 2013-12-27 DIAGNOSIS — H02839 Dermatochalasis of unspecified eye, unspecified eyelid: Secondary | ICD-10-CM | POA: Diagnosis not present

## 2013-12-27 DIAGNOSIS — E11329 Type 2 diabetes mellitus with mild nonproliferative diabetic retinopathy without macular edema: Secondary | ICD-10-CM | POA: Diagnosis not present

## 2013-12-27 DIAGNOSIS — E119 Type 2 diabetes mellitus without complications: Secondary | ICD-10-CM | POA: Diagnosis not present

## 2013-12-27 LAB — HM DIABETES EYE EXAM

## 2014-01-02 ENCOUNTER — Encounter: Payer: Self-pay | Admitting: Family Medicine

## 2014-01-05 ENCOUNTER — Telehealth: Payer: Self-pay | Admitting: Family Medicine

## 2014-01-05 ENCOUNTER — Telehealth: Payer: Self-pay | Admitting: Cardiology

## 2014-01-05 DIAGNOSIS — M48061 Spinal stenosis, lumbar region without neurogenic claudication: Secondary | ICD-10-CM | POA: Diagnosis not present

## 2014-01-05 DIAGNOSIS — M1710 Unilateral primary osteoarthritis, unspecified knee: Secondary | ICD-10-CM | POA: Diagnosis not present

## 2014-01-05 NOTE — Telephone Encounter (Signed)
Completed.

## 2014-01-05 NOTE — Telephone Encounter (Signed)
Pt dropped of a Pre-op form to be filled out and faxed by Dr Diona Browner. Form given to Butch Penny and Dr Diona Browner Thank you

## 2014-01-05 NOTE — Telephone Encounter (Signed)
Walk-In patient form received from the patient on 4.10.15/ Will hold in the HIM department until 4.13.15 when Pam F returns:djc

## 2014-01-08 DIAGNOSIS — Z0279 Encounter for issue of other medical certificate: Secondary | ICD-10-CM

## 2014-01-25 ENCOUNTER — Other Ambulatory Visit: Payer: Self-pay | Admitting: Family Medicine

## 2014-01-26 DIAGNOSIS — K0501 Acute gingivitis, non-plaque induced: Secondary | ICD-10-CM | POA: Diagnosis not present

## 2014-01-28 ENCOUNTER — Other Ambulatory Visit: Payer: Self-pay | Admitting: Family Medicine

## 2014-02-05 NOTE — Telephone Encounter (Signed)
Medication was changed to Glipizide-Metformin 2.5-250 mg, take 2 tablets by mouth two times a day.

## 2014-02-13 ENCOUNTER — Telehealth: Payer: Self-pay | Admitting: Cardiology

## 2014-02-13 NOTE — Telephone Encounter (Signed)
Surgical Clearance form refaxed to [facility]. @ [phone]). LM on VM for Kim to alert her to fax being sent at this time.

## 2014-02-13 NOTE — Telephone Encounter (Signed)
New message     Pt brought clearance form to [place] to be completed and fax to surgeon.  They did not get it please fax to [phone] attn kim

## 2014-03-16 ENCOUNTER — Emergency Department: Payer: Self-pay | Admitting: Emergency Medicine

## 2014-03-16 DIAGNOSIS — S0083XA Contusion of other part of head, initial encounter: Secondary | ICD-10-CM | POA: Diagnosis not present

## 2014-03-16 DIAGNOSIS — I10 Essential (primary) hypertension: Secondary | ICD-10-CM | POA: Diagnosis not present

## 2014-03-16 DIAGNOSIS — S0280XA Fracture of other specified skull and facial bones, unspecified side, initial encounter for closed fracture: Secondary | ICD-10-CM | POA: Diagnosis not present

## 2014-03-16 DIAGNOSIS — S0003XA Contusion of scalp, initial encounter: Secondary | ICD-10-CM | POA: Diagnosis not present

## 2014-03-16 DIAGNOSIS — S0180XA Unspecified open wound of other part of head, initial encounter: Secondary | ICD-10-CM | POA: Diagnosis not present

## 2014-03-16 DIAGNOSIS — R9431 Abnormal electrocardiogram [ECG] [EKG]: Secondary | ICD-10-CM | POA: Diagnosis not present

## 2014-03-16 DIAGNOSIS — S1093XA Contusion of unspecified part of neck, initial encounter: Secondary | ICD-10-CM | POA: Diagnosis not present

## 2014-03-16 DIAGNOSIS — W010XXA Fall on same level from slipping, tripping and stumbling without subsequent striking against object, initial encounter: Secondary | ICD-10-CM | POA: Diagnosis not present

## 2014-03-16 DIAGNOSIS — R110 Nausea: Secondary | ICD-10-CM | POA: Diagnosis not present

## 2014-03-16 DIAGNOSIS — S0990XA Unspecified injury of head, initial encounter: Secondary | ICD-10-CM | POA: Diagnosis not present

## 2014-03-16 DIAGNOSIS — S0230XA Fracture of orbital floor, unspecified side, initial encounter for closed fracture: Secondary | ICD-10-CM | POA: Diagnosis not present

## 2014-03-19 DIAGNOSIS — H1130 Conjunctival hemorrhage, unspecified eye: Secondary | ICD-10-CM | POA: Diagnosis not present

## 2014-03-19 DIAGNOSIS — S0010XA Contusion of unspecified eyelid and periocular area, initial encounter: Secondary | ICD-10-CM | POA: Diagnosis not present

## 2014-03-19 DIAGNOSIS — S0230XA Fracture of orbital floor, unspecified side, initial encounter for closed fracture: Secondary | ICD-10-CM | POA: Diagnosis not present

## 2014-03-20 ENCOUNTER — Encounter (HOSPITAL_COMMUNITY): Payer: Self-pay | Admitting: Pharmacy Technician

## 2014-03-21 ENCOUNTER — Encounter (HOSPITAL_COMMUNITY): Payer: Self-pay

## 2014-03-21 ENCOUNTER — Ambulatory Visit (HOSPITAL_COMMUNITY)
Admission: RE | Admit: 2014-03-21 | Discharge: 2014-03-21 | Disposition: A | Discharge status: Home / Self Care | Payer: Medicare Other | Source: Ambulatory Visit | Attending: Orthopedic Surgery | Admitting: Orthopedic Surgery

## 2014-03-21 ENCOUNTER — Ambulatory Visit (HOSPITAL_COMMUNITY)
Admission: RE | Admit: 2014-03-21 | Discharge: 2014-03-21 | Disposition: A | Discharge status: Home / Self Care | Payer: Medicare Other | Source: Ambulatory Visit | Attending: Orthopaedic Surgery | Admitting: Orthopaedic Surgery

## 2014-03-21 DIAGNOSIS — Z96659 Presence of unspecified artificial knee joint: Secondary | ICD-10-CM | POA: Diagnosis not present

## 2014-03-21 DIAGNOSIS — E785 Hyperlipidemia, unspecified: Secondary | ICD-10-CM | POA: Insufficient documentation

## 2014-03-21 DIAGNOSIS — I10 Essential (primary) hypertension: Secondary | ICD-10-CM | POA: Insufficient documentation

## 2014-03-21 DIAGNOSIS — K219 Gastro-esophageal reflux disease without esophagitis: Secondary | ICD-10-CM | POA: Insufficient documentation

## 2014-03-21 DIAGNOSIS — Z9861 Coronary angioplasty status: Secondary | ICD-10-CM | POA: Diagnosis not present

## 2014-03-21 DIAGNOSIS — I2510 Atherosclerotic heart disease of native coronary artery without angina pectoris: Secondary | ICD-10-CM | POA: Diagnosis not present

## 2014-03-21 DIAGNOSIS — Z01812 Encounter for preprocedural laboratory examination: Secondary | ICD-10-CM | POA: Insufficient documentation

## 2014-03-21 DIAGNOSIS — E119 Type 2 diabetes mellitus without complications: Secondary | ICD-10-CM | POA: Diagnosis not present

## 2014-03-21 DIAGNOSIS — Z01818 Encounter for other preprocedural examination: Secondary | ICD-10-CM | POA: Diagnosis not present

## 2014-03-21 HISTORY — DX: Presence of coronary angioplasty implant and graft: Z95.5

## 2014-03-21 HISTORY — DX: Cardiac arrhythmia, unspecified: I49.9

## 2014-03-21 LAB — URINE MICROSCOPIC-ADD ON

## 2014-03-21 LAB — COMPREHENSIVE METABOLIC PANEL
ALK PHOS: 65 U/L (ref 39–117)
ALT: 32 U/L (ref 0–35)
AST: 35 U/L (ref 0–37)
Albumin: 4.2 g/dL (ref 3.5–5.2)
BILIRUBIN TOTAL: 0.5 mg/dL (ref 0.3–1.2)
BUN: 24 mg/dL — AB (ref 6–23)
CO2: 21 mEq/L (ref 19–32)
Calcium: 9.5 mg/dL (ref 8.4–10.5)
Chloride: 105 mEq/L (ref 96–112)
Creatinine, Ser: 1.24 mg/dL — ABNORMAL HIGH (ref 0.50–1.10)
GFR calc Af Amer: 49 mL/min — ABNORMAL LOW (ref >90)
GFR calc non Af Amer: 42 mL/min — ABNORMAL LOW (ref >90)
GLUCOSE: 121 mg/dL — AB (ref 70–99)
Potassium: 4.7 mEq/L (ref 3.7–5.3)
Sodium: 140 mEq/L (ref 137–147)
Total Protein: 7.1 g/dL (ref 6.0–8.3)

## 2014-03-21 LAB — URINALYSIS, ROUTINE W REFLEX MICROSCOPIC
Bilirubin Urine: NEGATIVE
Glucose, UA: NEGATIVE mg/dL
Ketones, ur: NEGATIVE mg/dL
NITRITE: NEGATIVE
Protein, ur: NEGATIVE mg/dL
SPECIFIC GRAVITY, URINE: 1.019 (ref 1.005–1.030)
Urobilinogen, UA: 0.2 mg/dL (ref 0.0–1.0)
pH: 5.5 (ref 5.0–8.0)

## 2014-03-21 LAB — PROTIME-INR
INR: 1.02 (ref 0.00–1.49)
PROTHROMBIN TIME: 13.4 s (ref 11.6–15.2)

## 2014-03-21 LAB — CBC WITH DIFFERENTIAL/PLATELET
Basophils Absolute: 0 10*3/uL (ref 0.0–0.1)
Basophils Relative: 0 % (ref 0–1)
Eosinophils Absolute: 0.2 10*3/uL (ref 0.0–0.7)
Eosinophils Relative: 2 % (ref 0–5)
HCT: 33.5 % — ABNORMAL LOW (ref 36.0–46.0)
HEMOGLOBIN: 11.4 g/dL — AB (ref 12.0–15.0)
LYMPHS ABS: 1.5 10*3/uL (ref 0.7–4.0)
LYMPHS PCT: 18 % (ref 12–46)
MCH: 31 pg (ref 26.0–34.0)
MCHC: 34 g/dL (ref 30.0–36.0)
MCV: 91 fL (ref 78.0–100.0)
MONOS PCT: 9 % (ref 3–12)
Monocytes Absolute: 0.7 10*3/uL (ref 0.1–1.0)
NEUTROS PCT: 71 % (ref 43–77)
Neutro Abs: 5.9 10*3/uL (ref 1.7–7.7)
Platelets: 196 10*3/uL (ref 150–400)
RBC: 3.68 MIL/uL — AB (ref 3.87–5.11)
RDW: 12.8 % (ref 11.5–15.5)
WBC: 8.3 10*3/uL (ref 4.0–10.5)

## 2014-03-21 LAB — SURGICAL PCR SCREEN
MRSA, PCR: NEGATIVE
STAPHYLOCOCCUS AUREUS: POSITIVE — AB

## 2014-03-21 LAB — APTT: aPTT: 40 seconds — ABNORMAL HIGH (ref 24–37)

## 2014-03-21 LAB — TYPE AND SCREEN
ABO/RH(D): A POS
Antibody Screen: NEGATIVE

## 2014-03-21 NOTE — Pre-Procedure Instructions (Signed)
Tonya [name]  03/21/2014   Your procedure is scheduled on: Tuesday, April 03, 2014  Report to [facility] Admitting at 5:30 AM.  Call this number if you have problems the morning of surgery: [phone]   Remember:   Do not eat food or drink liquids after midnight Monday, April 02, 2014    Take these medicines the morning of surgery with A SIP OF WATER: carvedilol (COREG), citalopram (CELEXA), diltiazem (CARDIZEM CD), omeprazole (PRILOSEC), if needed:cetirizine (ZYRTEC)  for allergies  Stop taking Aspirin, vitamins and herbal medications. Do not take any NSAIDs ie: Ibuprofen, Advil, Naproxen or any medication containing Aspirin; stop 5 days prior to procedure (Thurs. 03/29/14).    Do not wear jewelry, make-up or nail polish.  Do not wear lotions, powders, or perfumes. You may wear deodorant.  Do not shave 48 hours prior to surgery.   Do not bring valuables to the hospital.  [facility] is not responsible for any belongings or valuables.               Contacts, dentures or bridgework may not be worn into surgery.  Leave suitcase in the car. After surgery it may be brought to your room.  For patients admitted to the hospital, discharge time is determined by your treatment team.                  Special Instructions:  Special Instructions:Special Instructions: [facility] - Preparing for Surgery  Before surgery, you can play an important role.  Because skin is not sterile, your skin needs to be as free of germs as possible.  You can reduce the number of germs on you skin by washing with CHG (chlorahexidine gluconate) soap before surgery.  CHG is an antiseptic cleaner which kills germs and bonds with the skin to continue killing germs even after washing.  Please DO NOT use if you have an allergy to CHG or antibacterial soaps.  If your skin becomes reddened/irritated stop using the CHG and inform your nurse when you arrive at Short Stay.  Do not shave (including legs and  underarms) for at least 48 hours prior to the first CHG shower.  You may shave your face.  Please follow these instructions carefully:   1.  Shower with CHG Soap the night before surgery and the morning of Surgery.  2.  If you choose to wash your hair, wash your hair first as usual with your normal shampoo.  3.  After you shampoo, rinse your hair and body thoroughly to remove the Shampoo.  4.  Use CHG as you would any other liquid soap.  You can apply chg directly  to the skin and wash gently with scrungie or a clean washcloth.  5.  Apply the CHG Soap to your body ONLY FROM THE NECK DOWN.  Do not use on open wounds or open sores.  Avoid contact with your eyes, ears, mouth and genitals (private parts).  Wash genitals (private parts) with your normal soap.  6.  Wash thoroughly, paying special attention to the area where your surgery will be performed.  7.  Thoroughly rinse your body with warm water from the neck down.  8.  DO NOT shower/wash with your normal soap after using and rinsing off the CHG Soap.  9.  Pat yourself dry with a clean towel.            10.  Wear clean pajamas.  11.  Place clean sheets on your bed the night of your first shower and do not sleep with pets.  Day of Surgery  Do not apply any lotions the morning of surgery.  Please wear clean clothes to the hospital/surgery center.   Please read over the following fact sheets that you were given: Pain Booklet, Coughing and Deep Breathing, Blood Transfusion Information, Total Joint Packet, MRSA Information and Surgical Site Infection Prevention

## 2014-03-22 NOTE — Progress Notes (Addendum)
Anesthesia Chart Review: Patient is a 73 year old female scheduled for left TKR on 04/03/14 by Dr. Durward Fortes.  History includes non-smoker, CAD s/p LAD BMS, afib, HLD, OA, anemia, depression, DM2, peripheral edema, HTN, colon CA s/p partial colectomy, GERD, anxiety, IBS, cholecystectomy, right TKA complicated by hemarthrosis 04/2010 with removal of prothesis and placement of antibiotic spacer 03/08/12 with right TKR revision 05/17/12. PCP is Dr. [name]. Cardiologist is Dr. Percival Spanish, last seen 04/03/13.  He did sign a note of cardiac clearance for this procedure.   EKG on 03/21/14 showed: NSR, low voltage QRS, cannot rule out anterior infarct (age undetermined). It was not felt significantly changed from prior tracing.  Nuclear stress test on 02/09/12 showed: Normal stress nuclear study. There is breast attenuation that likely accounts for the mild fixed anteroseptal defect with normal motion. LV Ejection Fraction: 71%. LV Wall Motion: NL LV Function; NL Wall Motion.   Echo on 11/01/08 showed: Overall left ventricular systolic function was normal. Left ventricular ejection fraction was estimated , range being 60 % to 65 %. There were no left ventricular regional wall motion abnormalities. Trivial MR/TR/PR.   Cardiac cath on 09/17/09 showed patent LAD stent, otherwise non-obstructive CAD (40% left main stenosis, 40% diagonal stenosis, 40% circumflex stenosis), normal LV function, normal right-sided pressures.   CXR on 03/21/14 showed: No acute abnormality.  Preoperative labs noted.  UA with moderate leukocytes, negative nitrites.  WBC WNl. H/H 11.4/33.5. BUN/Cr 24/1.24, glucose 121. UA results faxed to Dr. Rudene Anda office with confirmation. (Update: 03/23/2014 1:38 PM  I called Dr. Rudene Anda office. Staff will ensure that he is aware of E. Coli from urine culture.)  She has been cleared by cardiology. Further evaluation by her assigned anesthesiologist on the day of surgery, but if no acute changes then  I would anticipate that she could proceed as planned.  George Hugh [facility] Short Stay Center/Anesthesiology Phone [phone] 03/22/2014 1:05 PM

## 2014-03-23 ENCOUNTER — Other Ambulatory Visit: Payer: Self-pay | Admitting: *Deleted

## 2014-03-23 LAB — URINE CULTURE

## 2014-03-23 MED ORDER — ATORVASTATIN CALCIUM 20 MG PO TABS: 20.0000 mg | ORAL_TABLET | Freq: Every day | ORAL | Status: DC

## 2014-03-27 DIAGNOSIS — M1710 Unilateral primary osteoarthritis, unspecified knee: Secondary | ICD-10-CM | POA: Diagnosis not present

## 2014-03-28 DIAGNOSIS — S0230XA Fracture of orbital floor, unspecified side, initial encounter for closed fracture: Secondary | ICD-10-CM | POA: Diagnosis not present

## 2014-03-28 DIAGNOSIS — IMO0002 Reserved for concepts with insufficient information to code with codable children: Secondary | ICD-10-CM | POA: Diagnosis not present

## 2014-03-29 NOTE — H&P (Signed)
CHIEF COMPLAINT:  Painful left knee.   HISTORY OF PRESENT ILLNESS:  Tonya [name] is a very pleasant 73 year old white female who is seen today for followup evaluation of her left knee.  She has had a fair amount of arthritis in her left knee and she is also having pain with now every step.  She has even gone to having to use a cane.  She has nighttime pain also.  She has been tried previously with anti-inflammatories, however, because of her cardiologist, she is not to take any of the anti-inflammatories even though she occasionally sneaks an Aleve on a daily basis.  We have done corticosteroid injections as well as viscosupplementation.  She has chronic constant pain which is certainly interfering with her activities of daily living and certainly with her life.  She is also now having giving way in the medial joint line which is causing the possibilities of her falling.  In reality she comes in today with a fairly large facial hematoma on the left hand side.  Seen today for evaluation.  Family doctor is [name], internist Dr. Ardis Hughs, cardiologist Dr. Percival Spanish.     PAST MEDICAL HISTORY:  Her past medical history and general health is fair.     PAST SURGICAL HISTORY:   2010 colon cancer resection, 2007 for cardiac stenting.  Previous total knee replacement with several different operations, finally anterior revision total knee replacement.  Also has had [place], [place] childbirth, 1976 miscarriage, 2010 for bleeding in the colon and subsequent surgery.     CURRENT MEDICATIONS:   Cartia XT 240 mg q. a.m., Celexa 40 mg q. a.m., Cholestytram 4 grams Lit[place] 1 scoop b.i.d., Coreg 12.5 mg b.i.d., glipizide/metformin 2.5/250, 2 in the morning, 2 in the evening.  Lipitor 20 mg p.m., losartan/hydrochlorothiazide 100/12.5 q. a.m., omeprazole 20 mg b.i.d., potassium 20 mg b.i.d., nabumetone 750 mg b.i.d., NitroQuick 0.4 mg sublingual p.r.n., aspirin 81 mg daily q. a.m., calcium +D 600 mg q. p.m., and iron 65 mg p.o.,  b.i.d.  Multivitamin 1 daily.   ALLERGIES:   Augmentin causing swelling of the tongue.     REVIEW OF SYSTEMS:   A 14-point review of systems negative except for cataracts which are not excised.  She does have blurred vision seeing double, but when she gets the appropriate prescription for her glasses, she is able to see without difficulty.  She does have decreased hearing in the right greater than left with some tinnitus.  Upper partial dentures noted.  She occasionally gets short of breath with more on exertion.  She had pneumonia and bronchitis at least 40 years ago without sequelae.  She does have heart disease and actually underwent stenting.  It has been 15 years.  She has had hypertension.  She states she has a heart murmur as a child and apparently had returned according to her description with the doctor.  She had previous palpitations, but those resolved with her stent.  She has had hematochezia and melena in the past with her colon cancer, nothing recent.  She has been a diabetic for 15 years, on medications.  Anemia for 15 years.  She has easy bruising, but then again she is on aspirin.  Colon cancer has been resected in 2010.  She has had bladder infection back 5 years ago.  Blood in the urine at that time.  She has nocturia 1-3 times per evening.  She states she is constantly dizzy with occasional headaches.  She also passes out at times.  She has history of depression.     FAMILY HISTORY:   Positive for mother who has heart disease.  Father who has heart disease and myocardia infarction as well as cancer.     SOCIAL HISTORY:   A 73 year old white female who is divorced and retired from [employer].  She denies the use of tobacco or alcohol.     PHYSICAL EXAMINATION:  A 73 year old white female, well-developed, well-nourished, alert, pleasant, and cooperative in moderate distress secondary to left knee pain.     Height 5 feet 6 inches.  Weight 178 pounds.  BMI 28.7.  Vital  signs reveal temperature of 99.4.  Pulse 61.  Respirations 14.  Blood pressure 151/57.   His head is normocephalic with hematoma surrounding the left forehead and eye.    Eyes:  Pupils round, reactive to light and accommodation.  Extraocular movements intact.   Neck was supple, no carotid bruits were noted today.   Chest had good expansion. Lungs are essentially clear.   Cardiac had a regular rhythm and rate.  Normal S1, S2.  I really did not find a prominent murmur today.  Pulses were 1+ bilateral and symmetric in the lower extremity. Abdomen was scaphoid soft, nontender, no mass is palpable.  Normal bowel sounds are present. CNS:  She is oriented x3 and cranial nerves II-XII grossly intact.  Genital, rectal and breast exam was not indicated for a orthopedic evaluation.   Musculoskeletal:  The left knee has range of motion from about 5-6 degrees to about 95 to 100 degrees.  Certainly she has a lot of crepitus with range of motion.  Tender over the medial more than lateral joint line.  Trace effusion.  She has pseudolaxity with varus and valgus stressing with motion of the hip.   RADIOGRAPHS:  X-rays reveal bone-on-bone medial compartment OA with periarticular spurring both medially and laterally and in the patellofemoral area.     CLINICAL IMPRESSION:   1.  End-stage OA left knee. 2.  Status post revision of right total knee arthroplasty for infection. 3.  History of colon cancer. 4.  Arthrosclerotic cardiovascular disease.  5.  Hypertension. 6.  Depression.   RECOMMENDATIONS:  At this time I have reviewed paperwork from Dr. Percival Spanish that reveals that he feels that she is clear from a surgical standpoint.  I have also reviewed a note from Dr. Diona Browner who feels that she is cleared from a medical standpoint.  Therefore our plan is to proceed with a left total knee arthroplasty.  Procedure, risks and benefits were fully explained to her and she understands this.  Models were used to show her  this.  She would like to proceed in the very near future.  Mike Craze [place], [place] [phone]  03/29/2014 4:44 PM

## 2014-04-02 MED ORDER — ACETAMINOPHEN 10 MG/ML IV SOLN
1000.0000 mg | Freq: Four times a day (QID) | INTRAVENOUS | Status: DC
  Administered 2014-04-03: 1000 mg via INTRAVENOUS
  Filled 2014-04-02: qty 100

## 2014-04-02 MED ORDER — VANCOMYCIN HCL IN DEXTROSE 1-5 GM/200ML-% IV SOLN
1000.0000 mg | INTRAVENOUS | Status: AC
  Administered 2014-04-03: 1000 mg via INTRAVENOUS
  Filled 2014-04-02: qty 200

## 2014-04-03 ENCOUNTER — Inpatient Hospital Stay (HOSPITAL_COMMUNITY): Payer: Medicare Other | Admitting: Certified Registered Nurse Anesthetist

## 2014-04-03 ENCOUNTER — Inpatient Hospital Stay (HOSPITAL_COMMUNITY)
Admission: RE | Admit: 2014-04-03 | Discharge: 2014-04-06 | DRG: 470 | Disposition: A | Discharge status: Skilled Nursing Facility | Payer: Medicare Other | Source: Ambulatory Visit | Attending: Orthopaedic Surgery | Admitting: Orthopaedic Surgery

## 2014-04-03 ENCOUNTER — Encounter (HOSPITAL_COMMUNITY)
Admission: RE | Disposition: A | Discharge status: Skilled Nursing Facility | Payer: Self-pay | Source: Ambulatory Visit | Attending: Orthopaedic Surgery

## 2014-04-03 ENCOUNTER — Encounter (HOSPITAL_COMMUNITY): Payer: Medicare Other | Admitting: Vascular Surgery

## 2014-04-03 ENCOUNTER — Encounter (HOSPITAL_COMMUNITY): Payer: Self-pay | Admitting: *Deleted

## 2014-04-03 DIAGNOSIS — S1093XA Contusion of unspecified part of neck, initial encounter: Secondary | ICD-10-CM

## 2014-04-03 DIAGNOSIS — E785 Hyperlipidemia, unspecified: Secondary | ICD-10-CM | POA: Diagnosis not present

## 2014-04-03 DIAGNOSIS — J309 Allergic rhinitis, unspecified: Secondary | ICD-10-CM | POA: Diagnosis not present

## 2014-04-03 DIAGNOSIS — D62 Acute posthemorrhagic anemia: Secondary | ICD-10-CM | POA: Diagnosis not present

## 2014-04-03 DIAGNOSIS — K589 Irritable bowel syndrome without diarrhea: Secondary | ICD-10-CM | POA: Diagnosis present

## 2014-04-03 DIAGNOSIS — F3289 Other specified depressive episodes: Secondary | ICD-10-CM | POA: Diagnosis present

## 2014-04-03 DIAGNOSIS — Z7982 Long term (current) use of aspirin: Secondary | ICD-10-CM | POA: Diagnosis not present

## 2014-04-03 DIAGNOSIS — S8990XA Unspecified injury of unspecified lower leg, initial encounter: Secondary | ICD-10-CM | POA: Diagnosis not present

## 2014-04-03 DIAGNOSIS — Z5189 Encounter for other specified aftercare: Secondary | ICD-10-CM | POA: Diagnosis not present

## 2014-04-03 DIAGNOSIS — M1990 Unspecified osteoarthritis, unspecified site: Secondary | ICD-10-CM | POA: Diagnosis not present

## 2014-04-03 DIAGNOSIS — Z96652 Presence of left artificial knee joint: Secondary | ICD-10-CM

## 2014-04-03 DIAGNOSIS — S0083XA Contusion of other part of head, initial encounter: Secondary | ICD-10-CM | POA: Diagnosis present

## 2014-04-03 DIAGNOSIS — I10 Essential (primary) hypertension: Secondary | ICD-10-CM | POA: Diagnosis present

## 2014-04-03 DIAGNOSIS — Z85038 Personal history of other malignant neoplasm of large intestine: Secondary | ICD-10-CM

## 2014-04-03 DIAGNOSIS — E119 Type 2 diabetes mellitus without complications: Secondary | ICD-10-CM | POA: Diagnosis present

## 2014-04-03 DIAGNOSIS — R609 Edema, unspecified: Secondary | ICD-10-CM | POA: Diagnosis not present

## 2014-04-03 DIAGNOSIS — K219 Gastro-esophageal reflux disease without esophagitis: Secondary | ICD-10-CM | POA: Diagnosis present

## 2014-04-03 DIAGNOSIS — Z8249 Family history of ischemic heart disease and other diseases of the circulatory system: Secondary | ICD-10-CM

## 2014-04-03 DIAGNOSIS — F411 Generalized anxiety disorder: Secondary | ICD-10-CM | POA: Diagnosis present

## 2014-04-03 DIAGNOSIS — Z96659 Presence of unspecified artificial knee joint: Secondary | ICD-10-CM | POA: Diagnosis not present

## 2014-04-03 DIAGNOSIS — H269 Unspecified cataract: Secondary | ICD-10-CM | POA: Diagnosis present

## 2014-04-03 DIAGNOSIS — I2510 Atherosclerotic heart disease of native coronary artery without angina pectoris: Secondary | ICD-10-CM | POA: Diagnosis present

## 2014-04-03 DIAGNOSIS — G8918 Other acute postprocedural pain: Secondary | ICD-10-CM | POA: Diagnosis not present

## 2014-04-03 DIAGNOSIS — S99919A Unspecified injury of unspecified ankle, initial encounter: Secondary | ICD-10-CM | POA: Diagnosis not present

## 2014-04-03 DIAGNOSIS — M1710 Unilateral primary osteoarthritis, unspecified knee: Principal | ICD-10-CM | POA: Diagnosis present

## 2014-04-03 DIAGNOSIS — Z79899 Other long term (current) drug therapy: Secondary | ICD-10-CM | POA: Diagnosis not present

## 2014-04-03 DIAGNOSIS — W19XXXA Unspecified fall, initial encounter: Secondary | ICD-10-CM | POA: Diagnosis present

## 2014-04-03 DIAGNOSIS — M25569 Pain in unspecified knee: Secondary | ICD-10-CM | POA: Diagnosis not present

## 2014-04-03 DIAGNOSIS — S0003XA Contusion of scalp, initial encounter: Secondary | ICD-10-CM | POA: Diagnosis present

## 2014-04-03 DIAGNOSIS — F329 Major depressive disorder, single episode, unspecified: Secondary | ICD-10-CM | POA: Diagnosis present

## 2014-04-03 DIAGNOSIS — R918 Other nonspecific abnormal finding of lung field: Secondary | ICD-10-CM | POA: Diagnosis not present

## 2014-04-03 DIAGNOSIS — Z9861 Coronary angioplasty status: Secondary | ICD-10-CM | POA: Diagnosis not present

## 2014-04-03 DIAGNOSIS — Z471 Aftercare following joint replacement surgery: Secondary | ICD-10-CM | POA: Diagnosis not present

## 2014-04-03 DIAGNOSIS — R110 Nausea: Secondary | ICD-10-CM | POA: Diagnosis not present

## 2014-04-03 HISTORY — PX: TOTAL KNEE ARTHROPLASTY: SHX125

## 2014-04-03 LAB — URINALYSIS, ROUTINE W REFLEX MICROSCOPIC
GLUCOSE, UA: NEGATIVE mg/dL
Hgb urine dipstick: NEGATIVE
KETONES UR: NEGATIVE mg/dL
Leukocytes, UA: NEGATIVE
Nitrite: NEGATIVE
PROTEIN: 30 mg/dL — AB
Specific Gravity, Urine: 1.017 (ref 1.005–1.030)
Urobilinogen, UA: 0.2 mg/dL (ref 0.0–1.0)
pH: 6 (ref 5.0–8.0)

## 2014-04-03 LAB — GLUCOSE, CAPILLARY
GLUCOSE-CAPILLARY: 156 mg/dL — AB (ref 70–99)
GLUCOSE-CAPILLARY: 109 mg/dL — AB (ref 70–99)
Glucose-Capillary: 179 mg/dL — ABNORMAL HIGH (ref 70–99)
Glucose-Capillary: 169 mg/dL — ABNORMAL HIGH (ref 70–99)
Glucose-Capillary: 133 mg/dL — ABNORMAL HIGH (ref 70–99)

## 2014-04-03 LAB — HEMOGLOBIN A1C
HEMOGLOBIN A1C: 5.9 % — AB (ref <5.7)
Mean Plasma Glucose: 123 mg/dL — ABNORMAL HIGH (ref <117)

## 2014-04-03 LAB — URINE MICROSCOPIC-ADD ON

## 2014-04-03 SURGERY — ARTHROPLASTY, KNEE, TOTAL
Anesthesia: Regional | Site: Knee | Laterality: Left

## 2014-04-03 MED ORDER — SUCCINYLCHOLINE CHLORIDE 20 MG/ML IJ SOLN
INTRAMUSCULAR | Status: AC
  Filled 2014-04-03: qty 1

## 2014-04-03 MED ORDER — THROMBIN 20000 UNITS EX KIT
PACK | CUTANEOUS | Status: AC
  Filled 2014-04-03: qty 1

## 2014-04-03 MED ORDER — LOSARTAN POTASSIUM 50 MG PO TABS
100.0000 mg | ORAL_TABLET | Freq: Every day | ORAL | Status: DC
  Administered 2014-04-04 – 2014-04-06 (×3): 100 mg via ORAL
  Filled 2014-04-03 (×3): qty 2

## 2014-04-03 MED ORDER — OXYCODONE HCL 5 MG PO TABS: 5.0000 mg | ORAL_TABLET | Freq: Once | ORAL | Status: DC | PRN

## 2014-04-03 MED ORDER — LACTATED RINGERS IV SOLN
INTRAVENOUS | Status: DC | PRN
  Administered 2014-04-03 (×2): via INTRAVENOUS

## 2014-04-03 MED ORDER — LOSARTAN POTASSIUM-HCTZ 100-12.5 MG PO TABS: 1.0000 | ORAL_TABLET | Freq: Every day | ORAL | Status: DC

## 2014-04-03 MED ORDER — METHOCARBAMOL 1000 MG/10ML IJ SOLN
500.0000 mg | Freq: Four times a day (QID) | INTRAVENOUS | Status: DC | PRN
  Filled 2014-04-03: qty 5

## 2014-04-03 MED ORDER — CHLORHEXIDINE GLUCONATE 4 % EX LIQD
60.0000 mL | Freq: Every day | CUTANEOUS | Status: DC
  Filled 2014-04-03: qty 60

## 2014-04-03 MED ORDER — HYDROCHLOROTHIAZIDE 12.5 MG PO CAPS
12.5000 mg | ORAL_CAPSULE | Freq: Every day | ORAL | Status: DC
  Administered 2014-04-04 – 2014-04-06 (×3): 12.5 mg via ORAL
  Filled 2014-04-03 (×3): qty 1

## 2014-04-03 MED ORDER — ONDANSETRON HCL 4 MG/2ML IJ SOLN
INTRAMUSCULAR | Status: DC | PRN
  Administered 2014-04-03: 4 mg via INTRAVENOUS

## 2014-04-03 MED ORDER — METOCLOPRAMIDE HCL 10 MG PO TABS: 5.0000 mg | ORAL_TABLET | Freq: Three times a day (TID) | ORAL | Status: DC | PRN

## 2014-04-03 MED ORDER — MIDAZOLAM HCL 5 MG/5ML IJ SOLN
INTRAMUSCULAR | Status: DC | PRN
  Administered 2014-04-03 (×2): 1 mg via INTRAVENOUS

## 2014-04-03 MED ORDER — PROPOFOL 10 MG/ML IV BOLUS
INTRAVENOUS | Status: DC | PRN
  Administered 2014-04-03: 150 mg via INTRAVENOUS
  Administered 2014-04-03: 30 mg via INTRAVENOUS

## 2014-04-03 MED ORDER — CARVEDILOL 12.5 MG PO TABS
12.5000 mg | ORAL_TABLET | Freq: Two times a day (BID) | ORAL | Status: DC
  Administered 2014-04-03 – 2014-04-06 (×6): 12.5 mg via ORAL
  Filled 2014-04-03 (×8): qty 1

## 2014-04-03 MED ORDER — SODIUM CHLORIDE 0.9 % IV SOLN: INTRAVENOUS | Status: DC

## 2014-04-03 MED ORDER — VANCOMYCIN HCL 1000 MG IV SOLR
INTRAVENOUS | Status: AC
  Filled 2014-04-03: qty 1000

## 2014-04-03 MED ORDER — INSULIN ASPART 100 UNIT/ML ~~LOC~~ SOLN: 0.0000 [IU] | Freq: Every day | SUBCUTANEOUS | Status: DC

## 2014-04-03 MED ORDER — CHOLESTYRAMINE LIGHT 4 G PO PACK
4.0000 g | PACK | Freq: Two times a day (BID) | ORAL | Status: DC
  Administered 2014-04-03 – 2014-04-06 (×4): 4 g via ORAL
  Filled 2014-04-03 (×8): qty 1

## 2014-04-03 MED ORDER — KETOROLAC TROMETHAMINE 15 MG/ML IJ SOLN
15.0000 mg | Freq: Four times a day (QID) | INTRAMUSCULAR | Status: AC
  Administered 2014-04-03 (×2): 15 mg via INTRAVENOUS
  Filled 2014-04-03 (×2): qty 1

## 2014-04-03 MED ORDER — MIDAZOLAM HCL 2 MG/2ML IJ SOLN
INTRAMUSCULAR | Status: AC
  Filled 2014-04-03: qty 2

## 2014-04-03 MED ORDER — HYDROMORPHONE HCL PF 1 MG/ML IJ SOLN: 0.5000 mg | INTRAMUSCULAR | Status: DC | PRN

## 2014-04-03 MED ORDER — LIDOCAINE HCL (CARDIAC) 20 MG/ML IV SOLN
INTRAVENOUS | Status: AC
  Filled 2014-04-03: qty 5

## 2014-04-03 MED ORDER — ONDANSETRON HCL 4 MG/2ML IJ SOLN: 4.0000 mg | Freq: Four times a day (QID) | INTRAMUSCULAR | Status: DC | PRN

## 2014-04-03 MED ORDER — PROPOFOL 10 MG/ML IV BOLUS
INTRAVENOUS | Status: AC
Start: 2014-04-03 — End: 2014-04-03
  Filled 2014-04-03: qty 20

## 2014-04-03 MED ORDER — MUPIROCIN 2 % EX OINT
TOPICAL_OINTMENT | Freq: Two times a day (BID) | CUTANEOUS | Status: DC
  Administered 2014-04-03 – 2014-04-06 (×7): via NASAL
  Filled 2014-04-03: qty 22

## 2014-04-03 MED ORDER — METOCLOPRAMIDE HCL 5 MG/ML IJ SOLN
5.0000 mg | Freq: Three times a day (TID) | INTRAMUSCULAR | Status: DC | PRN
  Administered 2014-04-04: 10 mg via INTRAVENOUS
  Filled 2014-04-03: qty 2

## 2014-04-03 MED ORDER — ONDANSETRON HCL 4 MG PO TABS
4.0000 mg | ORAL_TABLET | Freq: Four times a day (QID) | ORAL | Status: DC | PRN
  Administered 2014-04-04 (×2): 4 mg via ORAL
  Filled 2014-04-03 (×2): qty 1

## 2014-04-03 MED ORDER — ROCURONIUM BROMIDE 50 MG/5ML IV SOLN
INTRAVENOUS | Status: AC
  Filled 2014-04-03: qty 1

## 2014-04-03 MED ORDER — EPHEDRINE SULFATE 50 MG/ML IJ SOLN
INTRAMUSCULAR | Status: AC
  Filled 2014-04-03: qty 1

## 2014-04-03 MED ORDER — BUPIVACAINE-EPINEPHRINE (PF) 0.25% -1:200000 IJ SOLN
INTRAMUSCULAR | Status: AC
  Filled 2014-04-03: qty 30

## 2014-04-03 MED ORDER — PANTOPRAZOLE SODIUM 40 MG PO TBEC
80.0000 mg | DELAYED_RELEASE_TABLET | Freq: Every day | ORAL | Status: DC
  Administered 2014-04-04 – 2014-04-06 (×3): 80 mg via ORAL
  Filled 2014-04-03 (×3): qty 2

## 2014-04-03 MED ORDER — FLEET ENEMA 7-19 GM/118ML RE ENEM: 1.0000 | ENEMA | Freq: Once | RECTAL | Status: AC | PRN

## 2014-04-03 MED ORDER — RIVAROXABAN 10 MG PO TABS
10.0000 mg | ORAL_TABLET | ORAL | Status: DC
  Administered 2014-04-03 – 2014-04-04 (×2): 10 mg via ORAL
  Filled 2014-04-03 (×3): qty 1

## 2014-04-03 MED ORDER — PHENOL 1.4 % MT LIQD: 1.0000 | OROMUCOSAL | Status: DC | PRN

## 2014-04-03 MED ORDER — PROMETHAZINE HCL 25 MG/ML IJ SOLN
6.2500 mg | INTRAMUSCULAR | Status: DC | PRN
Start: 2014-04-03 — End: 2014-04-03

## 2014-04-03 MED ORDER — OXYCODONE HCL 5 MG/5ML PO SOLN: 5.0000 mg | Freq: Once | ORAL | Status: DC | PRN

## 2014-04-03 MED ORDER — OXYCODONE HCL 5 MG PO TABS
5.0000 mg | ORAL_TABLET | ORAL | Status: DC | PRN
  Administered 2014-04-03 – 2014-04-05 (×5): 10 mg via ORAL
  Filled 2014-04-03 (×5): qty 2

## 2014-04-03 MED ORDER — MUPIROCIN CALCIUM 2 % NA OINT
1.0000 "application " | TOPICAL_OINTMENT | Freq: Two times a day (BID) | NASAL | Status: DC
  Filled 2014-04-03 (×18): qty 1

## 2014-04-03 MED ORDER — MENTHOL 3 MG MT LOZG: 1.0000 | LOZENGE | OROMUCOSAL | Status: DC | PRN

## 2014-04-03 MED ORDER — DOCUSATE SODIUM 100 MG PO CAPS
100.0000 mg | ORAL_CAPSULE | Freq: Two times a day (BID) | ORAL | Status: DC
  Administered 2014-04-04 – 2014-04-06 (×5): 100 mg via ORAL
  Filled 2014-04-03 (×8): qty 1

## 2014-04-03 MED ORDER — DILTIAZEM HCL ER COATED BEADS 240 MG PO CP24
240.0000 mg | ORAL_CAPSULE | Freq: Every day | ORAL | Status: DC
  Administered 2014-04-04 – 2014-04-06 (×3): 240 mg via ORAL
  Filled 2014-04-03 (×3): qty 1

## 2014-04-03 MED ORDER — POTASSIUM CHLORIDE CRYS ER 20 MEQ PO TBCR
20.0000 meq | EXTENDED_RELEASE_TABLET | Freq: Two times a day (BID) | ORAL | Status: DC
  Administered 2014-04-03 – 2014-04-06 (×7): 20 meq via ORAL
  Filled 2014-04-03 (×8): qty 1

## 2014-04-03 MED ORDER — ACETAMINOPHEN 650 MG RE SUPP: 650.0000 mg | Freq: Four times a day (QID) | RECTAL | Status: DC | PRN

## 2014-04-03 MED ORDER — CHLORHEXIDINE GLUCONATE 4 % EX LIQD
60.0000 mL | Freq: Once | CUTANEOUS | Status: DC
  Filled 2014-04-03: qty 60

## 2014-04-03 MED ORDER — METHOCARBAMOL 500 MG PO TABS
500.0000 mg | ORAL_TABLET | Freq: Four times a day (QID) | ORAL | Status: DC | PRN
  Administered 2014-04-04 – 2014-04-05 (×2): 500 mg via ORAL
  Filled 2014-04-03 (×2): qty 1

## 2014-04-03 MED ORDER — CITALOPRAM HYDROBROMIDE 40 MG PO TABS
40.0000 mg | ORAL_TABLET | Freq: Every day | ORAL | Status: DC
  Administered 2014-04-04 – 2014-04-05 (×2): 40 mg via ORAL
  Filled 2014-04-03 (×2): qty 1

## 2014-04-03 MED ORDER — EPHEDRINE SULFATE 50 MG/ML IJ SOLN
INTRAMUSCULAR | Status: DC | PRN
  Administered 2014-04-03: 10 mg via INTRAVENOUS
  Administered 2014-04-03: 5 mg via INTRAVENOUS
  Administered 2014-04-03: 10 mg via INTRAVENOUS
  Administered 2014-04-03 (×2): 5 mg via INTRAVENOUS

## 2014-04-03 MED ORDER — ACETAMINOPHEN 10 MG/ML IV SOLN
1000.0000 mg | Freq: Four times a day (QID) | INTRAVENOUS | Status: AC
  Administered 2014-04-03 – 2014-04-04 (×4): 1000 mg via INTRAVENOUS
  Filled 2014-04-03 (×5): qty 100

## 2014-04-03 MED ORDER — BISACODYL 10 MG RE SUPP: 10.0000 mg | Freq: Every day | RECTAL | Status: DC | PRN

## 2014-04-03 MED ORDER — ACETAMINOPHEN 325 MG PO TABS
650.0000 mg | ORAL_TABLET | Freq: Four times a day (QID) | ORAL | Status: DC | PRN
  Administered 2014-04-06: 650 mg via ORAL
  Filled 2014-04-03: qty 2

## 2014-04-03 MED ORDER — VANCOMYCIN HCL IN DEXTROSE 1-5 GM/200ML-% IV SOLN
1000.0000 mg | Freq: Two times a day (BID) | INTRAVENOUS | Status: AC
  Administered 2014-04-03: 1000 mg via INTRAVENOUS
  Filled 2014-04-03: qty 200

## 2014-04-03 MED ORDER — ALUM & MAG HYDROXIDE-SIMETH 200-200-20 MG/5ML PO SUSP: 30.0000 mL | ORAL | Status: DC | PRN

## 2014-04-03 MED ORDER — INSULIN ASPART 100 UNIT/ML ~~LOC~~ SOLN
0.0000 [IU] | Freq: Three times a day (TID) | SUBCUTANEOUS | Status: DC
  Administered 2014-04-03 – 2014-04-04 (×2): 3 [IU] via SUBCUTANEOUS
  Administered 2014-04-04: 2 [IU] via SUBCUTANEOUS
  Administered 2014-04-05 (×2): 3 [IU] via SUBCUTANEOUS
  Administered 2014-04-05 – 2014-04-06 (×3): 2 [IU] via SUBCUTANEOUS

## 2014-04-03 MED ORDER — FENTANYL CITRATE 0.05 MG/ML IJ SOLN
INTRAMUSCULAR | Status: AC
  Filled 2014-04-03: qty 5

## 2014-04-03 MED ORDER — BUPIVACAINE-EPINEPHRINE 0.25% -1:200000 IJ SOLN
INTRAMUSCULAR | Status: DC | PRN
  Administered 2014-04-03: 20 mL

## 2014-04-03 MED ORDER — HYDROMORPHONE HCL PF 1 MG/ML IJ SOLN
0.2500 mg | INTRAMUSCULAR | Status: DC | PRN
Start: 2014-04-03 — End: 2014-04-03

## 2014-04-03 MED ORDER — MAGNESIUM HYDROXIDE 400 MG/5ML PO SUSP: 30.0000 mL | Freq: Every day | ORAL | Status: DC | PRN

## 2014-04-03 MED ORDER — PROPOFOL 10 MG/ML IV BOLUS
INTRAVENOUS | Status: AC
  Filled 2014-04-03: qty 20

## 2014-04-03 MED ORDER — SODIUM CHLORIDE 0.9 % IR SOLN
Status: DC | PRN
  Administered 2014-04-03: 3000 mL

## 2014-04-03 MED ORDER — ONDANSETRON HCL 4 MG/2ML IJ SOLN
INTRAMUSCULAR | Status: AC
  Filled 2014-04-03: qty 2

## 2014-04-03 MED ORDER — SODIUM CHLORIDE 0.9 % IV SOLN
75.0000 mL/h | INTRAVENOUS | Status: DC
  Administered 2014-04-03: 75 mL/h via INTRAVENOUS

## 2014-04-03 MED ORDER — FENTANYL CITRATE 0.05 MG/ML IJ SOLN
INTRAMUSCULAR | Status: DC | PRN
  Administered 2014-04-03: 25 ug via INTRAVENOUS
  Administered 2014-04-03: 50 ug via INTRAVENOUS
  Administered 2014-04-03: 25 ug via INTRAVENOUS
  Administered 2014-04-03: 100 ug via INTRAVENOUS

## 2014-04-03 MED ORDER — SODIUM CHLORIDE 0.9 % IJ SOLN
INTRAMUSCULAR | Status: AC
  Filled 2014-04-03: qty 10

## 2014-04-03 SURGICAL SUPPLY — 63 items
BANDAGE ESMARK 6X9 LF (GAUZE/BANDAGES/DRESSINGS) ×1 IMPLANT
BLADE SAGITTAL 25.0X1.19X90 (BLADE) ×2 IMPLANT
BLADE SAGITTAL 25.0X1.19X90MM (BLADE) ×1
BNDG CMPR 9X6 STRL LF SNTH (GAUZE/BANDAGES/DRESSINGS) ×1
BNDG ESMARK 6X9 LF (GAUZE/BANDAGES/DRESSINGS) ×3
BOWL SMART MIX CTS (DISPOSABLE) ×3 IMPLANT
CAPT RP KNEE ×2 IMPLANT
CEMENT HV SMART SET (Cement) ×6 IMPLANT
COVER BACK TABLE 24X17X13 BIG (DRAPES) ×3 IMPLANT
COVER SURGICAL LIGHT HANDLE (MISCELLANEOUS) ×3 IMPLANT
CUFF TOURNIQUET SINGLE 34IN LL (TOURNIQUET CUFF) ×2 IMPLANT
CUFF TOURNIQUET SINGLE 44IN (TOURNIQUET CUFF) IMPLANT
DRAPE EXTREMITY T 121X128X90 (DRAPE) ×3 IMPLANT
DRAPE PROXIMA HALF (DRAPES) ×3 IMPLANT
DRSG ADAPTIC 3X8 NADH LF (GAUZE/BANDAGES/DRESSINGS) ×3 IMPLANT
DRSG PAD ABDOMINAL 8X10 ST (GAUZE/BANDAGES/DRESSINGS) ×6 IMPLANT
DURAPREP 26ML APPLICATOR (WOUND CARE) ×3 IMPLANT
ELECT CAUTERY BLADE 6.4 (BLADE) ×3 IMPLANT
ELECT REM PT RETURN 9FT ADLT (ELECTROSURGICAL) ×3
ELECTRODE REM PT RTRN 9FT ADLT (ELECTROSURGICAL) ×1 IMPLANT
EVACUATOR 1/8 PVC DRAIN (DRAIN) IMPLANT
FACESHIELD WRAPAROUND (MASK) ×6 IMPLANT
FACESHIELD WRAPAROUND OR TEAM (MASK) ×2 IMPLANT
FLOSEAL 10ML (HEMOSTASIS) IMPLANT
GAUZE XEROFORM 5X9 LF (GAUZE/BANDAGES/DRESSINGS) ×2 IMPLANT
GLOVE BIOGEL PI IND STRL 8 (GLOVE) ×1 IMPLANT
GLOVE BIOGEL PI IND STRL 8.5 (GLOVE) ×1 IMPLANT
GLOVE BIOGEL PI INDICATOR 8 (GLOVE) ×2
GLOVE BIOGEL PI INDICATOR 8.5 (GLOVE) ×2
GLOVE ECLIPSE 8.0 STRL XLNG CF (GLOVE) ×8 IMPLANT
GLOVE SURG ORTHO 8.5 STRL (GLOVE) ×8 IMPLANT
GOWN STRL REUS W/ TWL LRG LVL3 (GOWN DISPOSABLE) ×2 IMPLANT
GOWN STRL REUS W/TWL 2XL LVL3 (GOWN DISPOSABLE) ×3 IMPLANT
GOWN STRL REUS W/TWL LRG LVL3 (GOWN DISPOSABLE) ×6
HANDPIECE INTERPULSE COAX TIP (DISPOSABLE) ×3
KIT BASIN OR (CUSTOM PROCEDURE TRAY) ×3 IMPLANT
KIT ROOM TURNOVER OR (KITS) ×3 IMPLANT
MANIFOLD NEPTUNE II (INSTRUMENTS) ×3 IMPLANT
NEEDLE 22X1 1/2 (OR ONLY) (NEEDLE) ×2 IMPLANT
NS IRRIG 1000ML POUR BTL (IV SOLUTION) ×3 IMPLANT
PACK TOTAL JOINT (CUSTOM PROCEDURE TRAY) ×3 IMPLANT
PAD ARMBOARD 7.5X6 YLW CONV (MISCELLANEOUS) ×6 IMPLANT
PAD CAST 4YDX4 CTTN HI CHSV (CAST SUPPLIES) ×1 IMPLANT
PADDING CAST COTTON 4X4 STRL (CAST SUPPLIES) ×3
PADDING CAST COTTON 6X4 STRL (CAST SUPPLIES) ×3 IMPLANT
RUBBERBAND STERILE (MISCELLANEOUS) ×2 IMPLANT
SET HNDPC FAN SPRY TIP SCT (DISPOSABLE) ×1 IMPLANT
SPONGE GAUZE 4X4 12PLY (GAUZE/BANDAGES/DRESSINGS) ×3 IMPLANT
STAPLER VISISTAT 35W (STAPLE) ×3 IMPLANT
SUCTION FRAZIER TIP 10 FR DISP (SUCTIONS) ×1 IMPLANT
SUT BONE WAX W31G (SUTURE) ×3 IMPLANT
SUT ETHIBOND NAB CT1 #1 30IN (SUTURE) ×9 IMPLANT
SUT MNCRL AB 3-0 PS2 18 (SUTURE) ×3 IMPLANT
SUT VIC AB 0 CT1 27 (SUTURE) ×3
SUT VIC AB 0 CT1 27XBRD ANBCTR (SUTURE) ×1 IMPLANT
SUT VIC AB 1 CT1 27 (SUTURE) ×6
SUT VIC AB 1 CT1 27XBRD ANBCTR (SUTURE) ×1 IMPLANT
SYR CONTROL 10ML LL (SYRINGE) ×2 IMPLANT
TOWEL OR 17X24 6PK STRL BLUE (TOWEL DISPOSABLE) ×3 IMPLANT
TOWEL OR 17X26 10 PK STRL BLUE (TOWEL DISPOSABLE) ×3 IMPLANT
TRAY FOLEY CATH 16FRSI W/METER (SET/KITS/TRAYS/PACK) IMPLANT
WATER STERILE IRR 1000ML POUR (IV SOLUTION) ×2 IMPLANT
WRAP KNEE MAXI GEL POST OP (GAUZE/BANDAGES/DRESSINGS) ×3 IMPLANT

## 2014-04-03 NOTE — Anesthesia Preprocedure Evaluation (Addendum)
Anesthesia Evaluation  Patient identified by MRN, date of birth, ID band Patient awake    Reviewed: Allergy & Precautions, H&P , NPO status , Patient's Chart, lab work & pertinent test results, reviewed documented beta blocker date and time   Airway Mallampati: II TM Distance: >3 FB Neck ROM: Full    Dental  (+) Dental Advisory Given, Edentulous Upper, Poor Dentition   Pulmonary Recent URI ,    Pulmonary exam normal       Cardiovascular hypertension, Pt. on medications and Pt. on home beta blockers + CAD and + Cardiac Stents + dysrhythmias Atrial Fibrillation     Neuro/Psych PSYCHIATRIC DISORDERS Anxiety Depression    GI/Hepatic GERD-  Medicated,  Endo/Other  diabetes, Type 2, Oral Hypoglycemic Agents  Renal/GU      Musculoskeletal  (+) Arthritis -,   Abdominal   Peds  Hematology   Anesthesia Other Findings   Reproductive/Obstetrics                         Anesthesia Physical Anesthesia Plan  ASA: III  Anesthesia Plan: General and Regional   Post-op Pain Management:    Induction: Intravenous  Airway Management Planned: Oral ETT  Additional Equipment:   Intra-op Plan:   Post-operative Plan: Extubation in OR  Informed Consent: I have reviewed the patients History and Physical, chart, labs and discussed the procedure including the risks, benefits and alternatives for the proposed anesthesia with the patient or authorized representative who has indicated his/her understanding and acceptance.   Dental advisory given  Plan Discussed with: CRNA, Anesthesiologist and Surgeon  Anesthesia Plan Comments:        Anesthesia Quick Evaluation

## 2014-04-03 NOTE — Progress Notes (Signed)
Utilization review completed.  

## 2014-04-03 NOTE — Discharge Instructions (Signed)
Rivaroxaban oral tablets °What is this medicine? °RIVAROXABAN (ri va ROX a ban) is an anticoagulant (blood thinner). It is used to treat blood clots in the lungs or in the veins. It is also used after knee or hip surgeries to prevent blood clots. It is also used to lower the chance of stroke in people with a medical condition called atrial fibrillation. °This medicine may be used for other purposes; ask your health care provider or pharmacist if you have questions. °COMMON BRAND NAME(S): Xarelto, Xarelto Starter Pack °What should I tell my health care provider before I take this medicine? °They need to know if you have any of these conditions: °-bleeding disorders °-bleeding in the brain °-blood in your stools (black or tarry stools) or if you have blood in your vomit °-history of stomach bleeding °-kidney disease °-liver disease °-low blood counts, like low white cell, platelet, or red cell counts °-recent or planned spinal or epidural procedure °-take medicines that treat or prevent blood clots °-an unusual or allergic reaction to rivaroxaban, other medicines, foods, dyes, or preservatives °-pregnant or trying to get pregnant °-breast-feeding °How should I use this medicine? °Take this medicine by mouth with a glass of water. Follow the directions on the prescription label. Take your medicine at regular intervals. Do not take it more often than directed. Do not stop taking except on your doctor's advice. Stopping this medicine may increase your risk of a blot clot. Be sure to refill your prescription before you run out of medicine. °If you are taking this medicine after hip or knee replacement surgery, take it with or without food. If you are taking this medicine for atrial fibrillation, take it with your evening meal. If you are taking this medicine to treat blood clots, take it with food at the same time each day. If you are unable to swallow your tablet, you may crush the tablet and mix it in applesauce. Then,  immediately eat the applesauce. You should eat more food right after you eat the applesauce containing the crushed tablet. °Talk to your pediatrician regarding the use of this medicine in children. Special care may be needed. °Overdosage: If you think you have taken too much of this medicine contact a poison control center or emergency room at once. °NOTE: This medicine is only for you. Do not share this medicine with others. °What if I miss a dose? °If you take your medicine once a day and miss a dose, take the missed dose as soon as you remember. If you take your medicine twice a day and miss a dose, take the missed dose immediately. In this instance, 2 tablets may be taken at the same time. The next day you should take 1 tablet twice a day as directed. °What may interact with this medicine? °-aspirin and aspirin-like medicines °-certain antibiotics like erythromycin, azithromycin, and clarithromycin °-certain medicines for fungal infections like ketoconazole and itraconazole °-certain medicines for irregular heart beat like amiodarone, quinidine, dronedarone °-certain medicines for seizures like carbamazepine, phenytoin °-certain medicines that treat or prevent blood clots like warfarin, enoxaparin, and dalteparin °-conivaptan °-diltiazem °-felodipine °-indinavir °-lopinavir; ritonavir °-NSAIDS, medicines for pain and inflammation, like ibuprofen or naproxen °-ranolazine °-rifampin °-ritonavir °-St. John's wort °-verapamil °This list may not describe all possible interactions. Give your health care provider a list of all the medicines, herbs, non-prescription drugs, or dietary supplements you use. Also tell them if you smoke, drink alcohol, or use illegal drugs. Some items may interact with your medicine. °What   should I watch for while using this medicine? Visit your doctor or health care professional for regular checks on your progress. Your condition will be monitored carefully while you are receiving this  medicine. Notify your doctor or health care professional and seek emergency treatment if you develop breathing problems; changes in vision; chest pain; severe, sudden headache; pain, swelling, warmth in the leg; trouble speaking; sudden numbness or weakness of the face, arm, or leg. These can be signs that your condition has gotten worse. If you are going to have surgery, tell your doctor or health care professional that you are taking this medicine. Tell your health care professional that you use this medicine before you have a spinal or epidural procedure. Sometimes people who take this medicine have bleeding problems around the spine when they have a spinal or epidural procedure. This bleeding is very rare. If you have a spinal or epidural procedure while on this medicine, call your health care professional immediately if you have back pain, numbness or tingling (especially in your legs and feet), muscle weakness, paralysis, or loss of bladder or bowel control. Avoid sports and activities that might cause injury while you are using this medicine. Severe falls or injuries can cause unseen bleeding. Be careful when using sharp tools or knives. Consider using an Copy. Take special care brushing or flossing your teeth. Report any injuries, bruising, or red spots on the skin to your doctor or health care professional. What side effects may I notice from receiving this medicine? Side effects that you should report to your doctor or health care professional as soon as possible: -allergic reactions like skin rash, itching or hives, swelling of the face, lips, or tongue -back pain -redness, blistering, peeling or loosening of the skin, including inside the mouth -signs and symptoms of bleeding such as bloody or black, tarry stools; red or dark-brown urine; spitting up blood or brown material that looks like coffee grounds; red spots on the skin; unusual bruising or bleeding from the eye, gums, or  nose Side effects that usually do not require medical attention (Report these to your doctor or health care professional if they continue or are bothersome.): -dizziness -muscle pain This list may not describe all possible side effects. Call your doctor for medical advice about side effects. You may report side effects to FDA at 1-800-FDA-1088. Where should I keep my medicine? Keep out of the reach of children. Store at room temperature between 15 and 30 degrees C (59 and 86 degrees F). Throw away any unused medicine after the expiration date. NOTE: This sheet is a summary. It may not cover all possible information. If you have questions about this medicine, talk to your doctor, pharmacist, or health care provider.  2015, [employer]vier/Gold Standard. (2013-03-01 09:51:31)

## 2014-04-03 NOTE — Anesthesia Postprocedure Evaluation (Signed)
Anesthesia Post Note  Patient: Tonya [name]  Procedure(s) Performed: Procedure(s) (LRB): TOTAL KNEE ARTHROPLASTY (Left)  Anesthesia type: general  Patient location: PACU  Post pain: Pain level controlled  Post assessment: Patient's Cardiovascular Status Stable  Last Vitals:  Filed Vitals:   04/03/14 1011  BP:   Pulse: 64  Temp: 36.3 C  Resp: 14    Post vital signs: Reviewed and stable  Level of consciousness: sedated  Complications: No apparent anesthesia complications

## 2014-04-03 NOTE — Progress Notes (Signed)
Clinical Social Work Department CLINICAL SOCIAL WORK PLACEMENT NOTE 04/03/2014  Patient:  Tonya [name], Tonya [name]  Account Number:  [id number] Admit date:  04/03/2014  Clinical Social Worker:  Megan Salon  Date/time:  04/03/2014 11:56 AM  Clinical Social Work is seeking post-discharge placement for this patient at the following level of care:   [place]   (*CSW will update this form in Epic as items are completed)   04/03/2014  Patient/family provided with [place] Department of Clinical Social Work's list of facilities offering this level of care within the geographic area requested by the patient (or if unable, by the patient's family).  04/03/2014  Patient/family informed of their freedom to choose among providers that offer the needed level of care, that participate in Medicare, Medicaid or managed care program needed by the patient, have an available bed and are willing to accept the patient.  04/03/2014  Patient/family informed of [facility]' ownership interest in [facility], as well as of the fact that they are under no obligation to receive care at this facility.  PASARR submitted to EDS on  PASARR number received on   FL2 transmitted to all facilities in geographic area requested by pt/family on  04/03/2014 FL2 transmitted to all facilities within larger geographic area on   Patient informed that his/her managed care company has contracts with or will negotiate with  certain facilities, including the following:     Patient/family informed of bed offers received:   Patient chooses bed at  Physician recommends and patient chooses bed at    Patient to be transferred to  on   Patient to be transferred to facility by  Patient and family notified of transfer on  Name of family member notified:    The following physician request were entered in Epic:   Additional Comments: Patient has existing [place], MSW, [place]

## 2014-04-03 NOTE — Anesthesia Procedure Notes (Addendum)
Procedure Name: LMA Insertion Date/Time: 04/03/2014 7:32 AM Performed by: Blair Heys E Pre-anesthesia Checklist: Patient identified, Emergency Drugs available, Suction available and Patient being monitored Patient Re-evaluated:Patient Re-evaluated prior to inductionOxygen Delivery Method: Circle system utilized Preoxygenation: Pre-oxygenation with 100% oxygen Intubation Type: IV induction Ventilation: Mask ventilation without difficulty LMA: LMA inserted LMA Size: 4.0 Number of attempts: 1 Placement Confirmation: positive ETCO2 and breath sounds checked- equal and bilateral Tube secured with: Tape Dental Injury: Teeth and Oropharynx as per pre-operative assessment    Anesthesia Regional Block:  Femoral nerve block  Pre-Anesthetic Checklist: ,, timeout performed, Correct Patient, Correct Site, Correct Laterality, Correct Procedure, Correct Position, site marked, Risks and benefits discussed,  Surgical consent,  Pre-op evaluation,  At surgeon's request and post-op pain management  Laterality: Left  Prep: chloraprep       Needles:  Injection technique: Single-shot  Needle Type: Echogenic Stimulator Needle     Needle Length: 5cm 5 cm Needle Gauge: 22 and 22 G    Additional Needles:  Procedures: ultrasound guided (picture in chart) and nerve stimulator Femoral nerve block  Nerve Stimulator or Paresthesia:  Response: quadraceps contraction, 0.45 mA,   Additional Responses:   Narrative:  Start time: 04/03/2014 7:55 AM End time: 04/03/2014 7:05 AM Injection made incrementally with aspirations every 5 mL.  Performed by: Personally  Anesthesiologist: Earnest Bailey, MD  Additional Notes: Functioning IV was confirmed and monitors were applied.  A 64mm 22ga Arrow echogenic stimulator needle was used. Sterile prep and drape,hand hygiene and sterile gloves were used. Ultrasound guidance: relevant anatomy identified, needle position confirmed, local anesthetic spread visualized  around nerve(s)., vascular puncture avoided.  Image printed for medical record. Negative aspiration and negative test dose prior to incremental administration of local anesthetic. The patient tolerated the procedure well.

## 2014-04-03 NOTE — Transfer of Care (Signed)
Immediate Anesthesia Transfer of Care Note  Patient: Tonya [name]  Procedure(s) Performed: Procedure(s): TOTAL KNEE ARTHROPLASTY (Left)  Patient Location: PACU  Anesthesia Type:General and Regional  Level of Consciousness: awake and alert   Airway & Oxygen Therapy: Patient Spontanous Breathing and Patient connected to nasal cannula oxygen  Post-op Assessment: Report given to PACU RN, Post -op Vital signs reviewed and stable and Patient moving all extremities X 4  Post vital signs: Reviewed and stable  Complications: No apparent anesthesia complications

## 2014-04-03 NOTE — Op Note (Signed)
PATIENT ID:      TAMARIA KOLLIAS  MRN:     [id number] DOB/AGE:    July 03, 1941 / 73 y.o.       OPERATIVE REPORT    DATE OF PROCEDURE:  04/03/2014       PREOPERATIVE DIAGNOSIS:   LEFT KNEE OSTEOARTHRITIS-END STAGE                                                       Estimated body mass index is 28.19 kg/(m^2) as calculated from the following:   Height as of this encounter: 5\' 6"  (1.676 m).   Weight as of this encounter: 79.198 kg (174 lb 9.6 oz).     POSTOPERATIVE DIAGNOSIS:   same                                                                     Estimated body mass index is 28.19 kg/(m^2) as calculated from the following:   Height as of this encounter: 5\' 6"  (1.676 m).   Weight as of this encounter: 79.198 kg (174 lb 9.6 oz).     PROCEDURE:  Procedure(s): TOTAL KNEE ARTHROPLASTY left     SURGEON:  [name], MD    ASSISTANT:   Biagio Borg, PA-C   (Present and scrubbed throughout the case, critical for assistance with exposure, retraction, instrumentation, and closure.)          ANESTHESIA: regional and general     DRAINS: (LEFT KNEE) Hemovact drain(s) in the CLAMPED with  Suction Clamped :      TOURNIQUET TIME:  Total Tourniquet Time Documented: Thigh (Left) - 69 minutes Total: Thigh (Left) - 69 minutes     COMPLICATIONS:  None   CONDITION:  stable  PROCEDURE IN DETAIL: [place], Kahlia Lagunes W 04/03/2014, 9:17 AM

## 2014-04-03 NOTE — Progress Notes (Signed)
Clinical Social Work Department BRIEF PSYCHOSOCIAL ASSESSMENT 04/03/2014  Patient:  Tonya [name], Tonya [name]     Account Number:  [id number]     Admit date:  04/03/2014  Clinical Social Worker:  Megan Salon  Date/Time:  04/03/2014 11:53 AM  Referred by:  Physician  Date Referred:  04/03/2014 Referred for  SNF Placement   Other Referral:   Interview type:  Other - See comment Other interview type:   CSW spoke to patient and patient's daughter by bedside    PSYCHOSOCIAL DATA Living Status:  ALONE Admitted from facility:   Level of care:   Primary support name:  [name] Primary support relationship to patient:  CHILD, ADULT Degree of support available:   Good    CURRENT CONCERNS Current Concerns  Post-Acute Placement   Other Concerns:    SOCIAL WORK ASSESSMENT / PLAN Clinical Social Worker received referral for SNF placement at d/c. Clinical Social Worker met with patient at bedside to offer support and discuss patient needs at discharge.  CSW introduced self and explained reason for visit. Patient had visitor by bedside, patient's daughter. CSW explained SNF process to patient and patient's daughter. Patient reported she is agreeable for SNF placement and would prefer [facility].. CSW encouraged patient and family to think about additional SNF options pending availability of preferred facility. CSW will complete FL2 for MD's signature and will update patient and family when bed offers are received.    CSW remains available for support and to facilitate patient discharge needs once medically ready.   Assessment/plan status:  Psychosocial Support/Ongoing Assessment of Needs Other assessment/ plan:   Information/referral to community resources:   CSW information    PATIENT'S/FAMILY'S RESPONSE TO PLAN OF CARE: Patient states that she would like to go to [place], MSW, [place]

## 2014-04-03 NOTE — H&P (Signed)
  The recent History & Physical has been reviewed. I have personally examined the patient today. There is no interval change to the documented History & Physical. The patient would like to proceed with the procedure.  Tonya [name] W 04/03/2014,  7:14 AM

## 2014-04-03 NOTE — Progress Notes (Signed)
Orthopedic Tech Progress Note Patient Details:  Tonya [name] [birth year] [id number]  CPM Left Knee Left Knee Flexion (Degrees): 60 Left Knee Extension (Degrees): 0   [name] 04/03/2014, 8:15 PM

## 2014-04-03 NOTE — Progress Notes (Signed)
CSW provided bed offers to patient and patient's daughter by bedside. Patient chose [facility]. CSW will continue to follow patient for dc once medically stable.  Jeanette Caprice, MSW, [place]

## 2014-04-04 ENCOUNTER — Encounter (HOSPITAL_COMMUNITY): Payer: Self-pay | Admitting: Orthopaedic Surgery

## 2014-04-04 LAB — CBC
HCT: 23.3 % — ABNORMAL LOW (ref 36.0–46.0)
HCT: 24.8 % — ABNORMAL LOW (ref 36.0–46.0)
HEMOGLOBIN: 8.6 g/dL — AB (ref 12.0–15.0)
Hemoglobin: 8 g/dL — ABNORMAL LOW (ref 12.0–15.0)
MCH: 31.3 pg (ref 26.0–34.0)
MCH: 32.1 pg (ref 26.0–34.0)
MCHC: 34.3 g/dL (ref 30.0–36.0)
MCHC: 34.7 g/dL (ref 30.0–36.0)
MCV: 91 fL (ref 78.0–100.0)
MCV: 92.5 fL (ref 78.0–100.0)
PLATELETS: 140 10*3/uL — AB (ref 150–400)
Platelets: 142 10*3/uL — ABNORMAL LOW (ref 150–400)
RBC: 2.56 MIL/uL — AB (ref 3.87–5.11)
RBC: 2.68 MIL/uL — ABNORMAL LOW (ref 3.87–5.11)
RDW: 13.6 % (ref 11.5–15.5)
RDW: 13.6 % (ref 11.5–15.5)
WBC: 7.9 10*3/uL (ref 4.0–10.5)
WBC: 9.6 10*3/uL (ref 4.0–10.5)

## 2014-04-04 LAB — GLUCOSE, CAPILLARY
Glucose-Capillary: 137 mg/dL — ABNORMAL HIGH (ref 70–99)
Glucose-Capillary: 151 mg/dL — ABNORMAL HIGH (ref 70–99)
Glucose-Capillary: 149 mg/dL — ABNORMAL HIGH (ref 70–99)
Glucose-Capillary: 154 mg/dL — ABNORMAL HIGH (ref 70–99)

## 2014-04-04 LAB — BASIC METABOLIC PANEL
Anion gap: 14 (ref 5–15)
BUN: 20 mg/dL (ref 6–23)
CALCIUM: 7.4 mg/dL — AB (ref 8.4–10.5)
CHLORIDE: 106 meq/L (ref 96–112)
CO2: 19 mEq/L (ref 19–32)
CREATININE: 1.29 mg/dL — AB (ref 0.50–1.10)
GFR calc non Af Amer: 40 mL/min — ABNORMAL LOW (ref >90)
GFR, EST AFRICAN AMERICAN: 46 mL/min — AB (ref >90)
Glucose, Bld: 132 mg/dL — ABNORMAL HIGH (ref 70–99)
Potassium: 4.1 mEq/L (ref 3.7–5.3)
Sodium: 139 mEq/L (ref 137–147)

## 2014-04-04 MED ORDER — FERROUS SULFATE 325 (65 FE) MG PO TABS
325.0000 mg | ORAL_TABLET | Freq: Every day | ORAL | Status: DC
  Administered 2014-04-05 – 2014-04-06 (×2): 325 mg via ORAL
  Filled 2014-04-04 (×4): qty 1

## 2014-04-04 NOTE — Progress Notes (Signed)
Pt pulled out hemovac drain by mistake.Drain found in recliner tubing intact emptied out 50cc 42f bloody drainage

## 2014-04-04 NOTE — Progress Notes (Signed)
OT Cancellation Note  Patient Details Name: Tonya [name] MRN: [id number] DOB: [birth year]   Cancelled Treatment:    Reason Eval/Treat Not Completed:  Pt with plans to discharge to SNF for ST rehab. Will defer OT to SNF.  Pt is in agreement with this plan. Please contact [phone] if an OT eval is needed for discharge. Thanks.  [name] 04/04/2014, 8:54 AM [phone]

## 2014-04-04 NOTE — Progress Notes (Signed)
Physical Therapy Treatment Patient Details Name: Tonya [name] MRN: [id number] DOB: 09-Feb-1941 Today's Date: 04/04/2014    History of Present Illness s/p LTKA    PT Comments    Pt. Able to walk this session however limited by N/V. RN made aware and pt medicated. Pt. Declined to complete amb again but was able to complete therex. Will continue to work on amb next session. Continue to recommend SNF at D/C.   Follow Up Recommendations  SNF     Equipment Recommendations  Rolling walker with 5" wheels;3in1 (PT)    Recommendations for Other Services       Precautions / Restrictions Precautions Precautions: Knee Restrictions LLE Weight Bearing: Partial weight bearing LLE Partial Weight Bearing Percentage or Pounds: 50%    Mobility  Bed Mobility Overal bed mobility: Needs Assistance Bed Mobility: Supine to Sit     Supine to sit: Min guard     General bed mobility comments: Pt. utilized bed rails to bring trunk up to sitting. cues for positioning   Transfers Overall transfer level: Needs assistance Equipment used: Rolling walker (2 wheeled) Transfers: Sit to/from Stand Sit to Stand: Min assist         General transfer comment: cues for hand placement and technique. Assist to power up into upright standing; also to control descent from stand to sit.  Ambulation/Gait Ambulation/Gait assistance: Min assist Ambulation Distance (Feet): 10 Feet Assistive device: Rolling walker (2 wheeled) Gait Pattern/deviations: Step-to pattern;Decreased stride length Gait velocity: very decreased Gait velocity interpretation: Below normal speed for age/gender General Gait Details: Cues for gait sequence; Noted significant knee buckle in L stance; cues to support self on RW and movement of RW and to activate L quad for stance stability.    Stairs            Wheelchair Mobility    Modified Rankin (Stroke Patients Only)       Balance                                    Cognition Arousal/Alertness: Awake/alert Behavior During Therapy: WFL for tasks assessed/performed Overall Cognitive Status: Within Functional Limits for tasks assessed                      Exercises Total Joint Exercises Ankle Circles/Pumps: AROM;Both;10 reps;Seated Quad Sets: AROM;Left;Seated;10 reps Heel Slides: AAROM;Seated;Left;5 reps Hip ABduction/ADduction: AAROM;Left;Seated;10 reps Straight Leg Raises: AAROM;Left;5 reps;Seated Long Arc Quad: AAROM;Seated;Left;5 reps    General Comments        Pertinent Vitals/Pain Pt. Did not rate pain. Pt. Given medicine for nausea.    Home Living                      Prior Function            PT Goals (current goals can now be found in the care plan section) Progress towards PT goals: Progressing toward goals    Frequency  7X/week    PT Plan Current plan remains appropriate    Co-evaluation             End of Session Equipment Utilized During Treatment: Gait belt Activity Tolerance: Patient limited by pain Patient left: in chair;with call bell/phone within reach;with nursing/sitter in room     Time: [id number] PT Time Calculation (min): 28 min  Charges:  $Gait Training: 8-22 mins $Therapeutic Exercise: 8-22 mins  G Codes:      Jacqualyn Posey 04/04/2014, 2:07 PM

## 2014-04-04 NOTE — Op Note (Signed)
NAMEMarland [name]  AILYN, GLADD NO.:  [id number]  MEDICAL RECORD NO.:  [id number]  LOCATION:  5N08C                        FACILITY:  [facility]  PHYSICIAN:  Vonna Kotyk. Whitfield, M.D.DATE OF BIRTH:  [birth year]  DATE OF PROCEDURE:  04/03/2014 DATE OF DISCHARGE:                              OPERATIVE REPORT   PREOPERATIVE DIAGNOSIS:  End-stage osteoarthritis, left knee.  POSTOPERATIVE DIAGNOSIS:  End-stage osteoarthritis, left knee.  PROCEDURE:  Left total knee replacement.  SURGEON:  Vonna Kotyk. Durward Fortes, M.D.  ASSISTANT:  Aaron Edelman D. Petrarca, P.A.-C. who is present throughout the operative procedure to ensure its timely completion.  ANESTHESIA:  General with supplemental femoral nerve block.  COMPLICATIONS:  None.  COMPONENTS:  DePuy LCS standard femoral component, 12.5 mm polyethylene bridging bearing, a #3 rotating keeled tibial tray, a metal back 3-peg rotating patella.  Components were secured with polymethyl methacrylate with added vancomycin.  PROCEDURE IN DETAIL:  Ms. Detjen was met in the holding area, identified her left knee as the appropriate operative site and marked accordingly.  Anesthesia performed a femoral nerve block.  The patient was then transported to room #7 and placed under general anesthesia without difficulty.  Nursing staff inserted a Foley catheter. The urine was a little bit cloudy.  She had a preoperative E. coli urinary tract infection and treated with sulfa for a week.  We obtained a new culture at the time of surgery.  The tourniquet was then applied to the left thigh and the left leg was then prepped with chlorhexidine scrub and then DuraPrep and the tourniquet to the tips of the toes.  Sterile draping was performed with extremity still elevated, it was Esmarch exsanguinated with a proximal tourniquet at 350 mmHg.  Time-out was called.  A midline longitudinal incision was made centered about the patella, extending from the superior  pouch to tibial tubercle.  Via sharp dissection, incision was carried down to subcutaneous tissue.  Gross bleeders were Bovie coagulated.  First layer of capsule incised in midline and medial parapatellar incision was then made with the Bovie. The joint was entered.  There was a clear yellow joint effusion.  The patella was everted 180 degrees laterally and knee flexed to 90 degrees. There was a fixed flexion contracture in a varus position.  I could correct the knee to neutral.  There was complete absence of articular cartilage along the medial femoral condyle with the eburnated bone. There were large osteophytes along the medial and lateral femoral condyles as well as the medial tibial plateau.  The osteophytes were removed.  I measured a standard femoral component.  First, bony cut was then made transversely in the proximal tibia using a 7-degree angle of declination.  With each bony cut on both the tibia and the femur, I checked with the external guide.  Subsequent cuts were then made on the femur using a standard tibial jig.  A 4-degree distal femoral valgus cut was made.  The lamina spreaders were then placed within the medial and lateral compartments to remove medial and lateral menisci.  ACL and PCL, there was a large fabella laterally that was also removed.  I removed osteophytes in the posterior femoral condyle  using a 3/4-inch curved osteotome.  There was a Baker cyst identified posterior medially in the portion that I could visualize was coagulated.  MCL and LCL remained intact throughout the procedure.  Flexion and extension gaps were perfectly symmetrical, 12.5 mm.  Retractors were then placed about the tibia, was advanced anteriorly and measured with #3 tibial tray.  This was pinned in place.  Center hole was made followed by the keeled cut.  With the #3 tibial jig in place, the 12.5 mm bridging bearing was inserted followed by the trial standard femoral component.  The  entire construct was then reduced through a full range of motion, full extension, flexion without malrotation of the tibial component.  There was no opening with varus or valgus stress.  Patella was prepared by removing 10 mm of bone leaving 14 mm patella thickness.  The patellar jig was applied for the 3 holes.  The trial patella was then inserted and reduced and through a full range of motion, it remained perfectly stable.  Trial components were then removed.  The joint was copiously irrigated with saline solution.  The final components were then impacted with polymethyl methacrylate with added vancomycin.  We initially inserted the tibial tray followed by the polyethylene bearing and the standard femoral component.  The methacrylate was removed in the periphery of the components.  The patella was also applied with the same methacrylate and a patellar clamp, and extraneous methacrylate removed from its edges as well.  At approximately 16 minutes of methacrylate had matured, during which time, we injected the joint with 0.25% Marcaine with epinephrine. Tourniquet was then deflated after complete maturation of methacrylate in approximately 69 minutes.  There was still some bleeding along the joint surface.  I used thrombin spray and was very fastidious with the Bovie to provide good hemostasis.  We did that the joint was nice and dry at the time of closure.  Medium-sized Hemovac was inserted with the clamp for several hours.  The deep capsule was closed with #1 Ethibond. Superficial capsule closed with running 0 Vicryl, subcu with 2-0 Vicryl and 3-0 Monocryl.  Skin closed with skin clips.  Sterile bulky dressing was applied followed by the patient's support stocking.  The patient tolerated the procedure without complications.     Vonna Kotyk. Durward Fortes, M.D.    PWW/MEDQ  D:  04/03/2014  T:  04/03/2014  Job:  [id number]

## 2014-04-04 NOTE — Progress Notes (Signed)
Patient ID: Tonya [name], female   DOB: 08-15-1941, 73 y.o.   MRN: [id number] PATIENT ID: Tonya [name]        MRN:  [id number]          DOB/AGE: Apr 16, 1941 / 73 y.o.    Tonya Fears, MD   Biagio Borg, PA-C [address] [place], [place]  [address]                             [phone]   PROGRESS NOTE  Subjective:  negative for Chest Pain  negative for Shortness of Breath  negative for Nausea/Vomiting   negative for Calf Pain    Tolerating Diet: yes         Patient reports pain as mild.     Minimal pain.  Very comfortable.  Eating breakfast  Objective: Vital signs in last 24 hours:   Patient Vitals for the past 24 hrs:  BP Temp Temp src Pulse Resp SpO2  04/04/14 0614 136/44 mmHg 98.7 F (37.1 C) Oral 74 16 96 %  04/04/14 0125 113/49 mmHg 98.2 F (36.8 C) Oral 67 16 97 %  04/03/14 2102 130/48 mmHg 99.3 F (37.4 C) Oral 64 16 99 %  04/03/14 1655 138/48 mmHg - - - - -  04/03/14 1634 138/48 mmHg 98.3 F (36.8 C) Oral 65 18 96 %  04/03/14 1040 133/55 mmHg 98.2 F (36.8 C) - 64 16 94 %  04/03/14 1011 - 97.3 F (36.3 C) - 64 14 93 %  04/03/14 1006 146/61 mmHg - - 68 - -  04/03/14 1000 - - - 68 17 96 %  04/03/14 0946 182/71 mmHg 97.8 F (36.6 C) - 80 16 95 %      Intake/Output from previous day:   07/07 0701 - 07/08 0700 In: 2380 [P.O.:480; I.V.:1900] Out: 1450 [Urine:1025; Drains:225]   Intake/Output this shift:       Intake/Output     07/07 0701 - 07/08 0700 07/08 0701 - 07/09 0700   P.O. 480    I.V. (mL/kg) 1900 (24)    Total Intake(mL/kg) 2380 (30.1)    Urine (mL/kg/hr) 1025 (0.5)    Drains 225 (0.1)    Blood 200 (0.1)    Total Output 1450     Net +930             LABORATORY DATA:  No labs yet!  No results found for this basename: WBC, HGB, HCT, PLT,  in the last 168 hours No results found for this basename: NA, K, CL, CO2, BUN, CREATININE, GLUCOSE, CALCIUM,  in the last 168 hours Lab Results  Component Value Date   INR 1.02 03/21/2014    INR 0.90 05/11/2012   INR 1.04 03/02/2012    Recent Radiographic Studies :  Chest 2 View  03/21/2014   CLINICAL DATA:  Knee replacement.  Preoperative chest x-ray.  EXAM: CHEST  2 VIEW  COMPARISON:  03/02/2012.  FINDINGS: Mediastinum and hilar structures are normal. The lungs are clear. No pleural effusion or pneumothorax. Cardiomegaly with normal pulmonary vascularity. No acute bony abnormality.  IMPRESSION: No acute abnormality.   Electronically Signed   By: Marcello Moores  Register   On: 03/21/2014 14:12     Examination:  General appearance: alert, cooperative and mild distress Resp: clear to auscultation bilaterally Cardio: regular rate and rhythm GI: normal findings: bowel sounds normal  Wound Exam: clean, dry, intact dressing  Drainage:  None: wound  tissue dry  Motor Exam: EHL, FHL, Anterior Tibial and Posterior Tibial Intact  Sensory Exam: Superficial Peroneal, Deep Peroneal and Tibial normal  Vascular Exam: Left posterior tibial artery has 1+ (weak) pulse  Assessment:    1 Day Post-Op  Procedure(s) (LRB): TOTAL KNEE ARTHROPLASTY (Left)  ADDITIONAL DIAGNOSIS:  Active Problems:   S/P total knee replacement using cement     Plan: Physical Therapy as ordered Partial Weight Bearing @ 50% (PWB)  DVT Prophylaxis:  Xarelto, Foot Pumps and TED hose  DISCHARGE PLAN: Skilled Nursing Facility/Rehab [facility]  DISCHARGE NEEDS: HHPT, CPM, Walker and 3-in-1 comode seat  Will check with her primary MD about Celexa         Mike Craze. [place], [place] [phone]  04/04/2014 7:56 AM  04/04/2014 7:56 AM

## 2014-04-04 NOTE — Evaluation (Signed)
Physical Therapy Evaluation (late entry for eval completed 04/03/14) Patient Details Name: Tonya [name] MRN: [id number] DOB: October 19, 1940 Today's Date: 04/04/2014   History of Present Illness  s/p LTKA  Clinical Impression  Pt is s/p TKA resulting in the deficits listed below (see PT Problem List).  Pt will benefit from skilled PT to increase their independence and safety with mobility to allow discharge to the venue listed below.      Follow Up Recommendations SNF    Equipment Recommendations  Rolling walker with 5" wheels;3in1 (PT)    Recommendations for Other Services       Precautions / Restrictions Precautions Precautions: Knee Precaution Comments: Educated pt on rationale for propping ankle and no pillow under knee Restrictions LLE Weight Bearing:  (not specified yet, likely will be 50%PWB)      Mobility  Bed Mobility Overal bed mobility: Needs Assistance Bed Mobility: Supine to Sit;Sit to Supine     Supine to sit: Min assist Sit to supine: Min guard   General bed mobility comments: Cues for technique and to self-monitor for activity tolerance; Assist for LLE off and back on the bed  Transfers Overall transfer level: Needs assistance Equipment used: Rolling walker (2 wheeled) Transfers: Sit to/from Stand Sit to Stand: +2 safety/equipment;Min assist         General transfer comment: cues for hand placement and technique; also to control descent from stand to sit  Ambulation/Gait Ambulation/Gait assistance: Min assist Ambulation Distance (Feet): 2 Feet (sidesteps toward [facility]) Assistive device: Rolling walker (2 wheeled)       General Gait Details: Cues for gait sequence; Noted significant knee buckle in L stance; cues to support self on RW and to activate L quad for stance stability  Stairs            Wheelchair Mobility    Modified Rankin (Stroke Patients Only)       Balance Overall balance assessment: Needs assistance   Sitting  balance-Leahy Scale: Good     Standing balance support: Bilateral upper extremity supported Standing balance-Leahy Scale: Poor                               Pertinent Vitals/Pain 4/10 L knee pain patient repositioned for comfort and Optimal knee extension     Home Living Family/patient expects to be discharged to:: Skilled nursing facility Living Arrangements: Children               Additional Comments: prefers [facility]    Prior Function Level of Independence: Independent               Hand Dominance        Extremity/Trunk Assessment   Upper Extremity Assessment: Overall WFL for tasks assessed           Lower Extremity Assessment: LLE deficits/detail   LLE Deficits / Details: Grossly decr strength, decr quad acitvation; able to activate for quad set, but significant quad lag with straight leg raise     Communication   Communication: No difficulties  Cognition Arousal/Alertness: Awake/alert Behavior During Therapy: WFL for tasks assessed/performed Overall Cognitive Status: Within Functional Limits for tasks assessed                      General Comments General comments (skin integrity, edema, etc.): Pt recently fell in her parking lot (stooped over, taking her trash out); Large blackeye with scab L eye  Exercises Total Joint Exercises Ankle Circles/Pumps: AROM;Both;10 reps Quad Sets: AROM;Left;5 reps Heel Slides: AAROM;Left;5 reps      Assessment/Plan    PT Assessment Patient needs continued PT services  PT Diagnosis Difficulty walking;Acute pain   PT Problem List Decreased strength;Decreased range of motion;Decreased activity tolerance;Decreased balance;Decreased knowledge of use of DME;Decreased mobility;Decreased knowledge of precautions;Pain  PT Treatment Interventions DME instruction;Gait training;Functional mobility training;Therapeutic activities;Therapeutic exercise;Balance training;Patient/family education   PT  Goals (Current goals can be found in the Care Plan section) Acute Rehab PT Goals Patient Stated Goal: wants to get back to independence PT Goal Formulation: With patient Time For Goal Achievement: 04/11/14 Potential to Achieve Goals: Good    Frequency 7X/week   Barriers to discharge        Co-evaluation               End of Session Equipment Utilized During Treatment: Gait belt               Time: [id number] PT Time Calculation (min): 21 min   Charges:  PT Evaluation Therapeutic Activity       PT G CodesRoney Marion Hamff 04/04/2014, 8:02 AM  Roney [place], PT  Acute Rehabilitation Services Pager [phone] Office [phone]

## 2014-04-04 NOTE — Care Management Note (Signed)
CARE MANAGEMENT NOTE 04/04/2014  Patient:  Tonya [name], Tonya [name]   Account Number:  [id number]  Date Initiated:  04/04/2014  Documentation initiated by:  Ricki Miller  Subjective/Objective Assessment:   73 yr old female s/p left total knee arthroplasty.     Action/Plan:   Patient is for shortterm rehab at [facility]. would like to go to [facility]. Social worker is aware.   Anticipated DC Date:  04/06/2014   Anticipated DC Plan:  SKILLED NURSING FACILITY  In-house referral  Clinical Social Worker      DC Planning Services  CM consult      [facility] Choice  NA   Choice offered to / List presented to:     DME arranged  NA        [place] arranged  NA      Status of service:  Completed, signed off Medicare Important Message given?  NA - LOS <3 / Initial given by admissions (If response is "NO", the following Medicare IM given date fields will be blank) Date Medicare IM given:   Medicare IM given by:   Date Additional Medicare IM given:   Additional Medicare IM given by:    Discharge Disposition:  [place]  Per UR Regulation:  Reviewed for med. necessity/level of care/duration of stay

## 2014-04-05 ENCOUNTER — Inpatient Hospital Stay (HOSPITAL_COMMUNITY): Payer: Medicare Other

## 2014-04-05 DIAGNOSIS — R918 Other nonspecific abnormal finding of lung field: Secondary | ICD-10-CM | POA: Diagnosis not present

## 2014-04-05 LAB — CBC
HCT: 23.7 % — ABNORMAL LOW (ref 36.0–46.0)
HEMATOCRIT: 22.6 % — AB (ref 36.0–46.0)
Hemoglobin: 7.7 g/dL — ABNORMAL LOW (ref 12.0–15.0)
Hemoglobin: 8.1 g/dL — ABNORMAL LOW (ref 12.0–15.0)
MCH: 31 pg (ref 26.0–34.0)
MCH: 31.6 pg (ref 26.0–34.0)
MCHC: 34.1 g/dL (ref 30.0–36.0)
MCHC: 34.2 g/dL (ref 30.0–36.0)
MCV: 91.1 fL (ref 78.0–100.0)
MCV: 92.6 fL (ref 78.0–100.0)
PLATELETS: 137 10*3/uL — AB (ref 150–400)
Platelets: 134 10*3/uL — ABNORMAL LOW (ref 150–400)
RBC: 2.48 MIL/uL — ABNORMAL LOW (ref 3.87–5.11)
RBC: 2.56 MIL/uL — AB (ref 3.87–5.11)
RDW: 13.7 % (ref 11.5–15.5)
RDW: 13.5 % (ref 11.5–15.5)
WBC: 10.6 10*3/uL — ABNORMAL HIGH (ref 4.0–10.5)
WBC: 10.5 10*3/uL (ref 4.0–10.5)

## 2014-04-05 LAB — URINE MICROSCOPIC-ADD ON

## 2014-04-05 LAB — URINALYSIS, ROUTINE W REFLEX MICROSCOPIC
Bilirubin Urine: NEGATIVE
Glucose, UA: NEGATIVE mg/dL
KETONES UR: 15 mg/dL — AB
LEUKOCYTES UA: NEGATIVE
NITRITE: NEGATIVE
Protein, ur: 30 mg/dL — AB
SPECIFIC GRAVITY, URINE: 1.02 (ref 1.005–1.030)
Urobilinogen, UA: 0.2 mg/dL (ref 0.0–1.0)
pH: 5.5 (ref 5.0–8.0)

## 2014-04-05 LAB — BASIC METABOLIC PANEL
ANION GAP: 15 (ref 5–15)
Anion gap: 16 — ABNORMAL HIGH (ref 5–15)
BUN: 22 mg/dL (ref 6–23)
BUN: 20 mg/dL (ref 6–23)
CHLORIDE: 99 meq/L (ref 96–112)
CHLORIDE: 102 meq/L (ref 96–112)
CO2: 18 mEq/L — ABNORMAL LOW (ref 19–32)
CO2: 19 meq/L (ref 19–32)
Calcium: 7.4 mg/dL — ABNORMAL LOW (ref 8.4–10.5)
Calcium: 7.4 mg/dL — ABNORMAL LOW (ref 8.4–10.5)
Creatinine, Ser: 1.2 mg/dL — ABNORMAL HIGH (ref 0.50–1.10)
Creatinine, Ser: 1.22 mg/dL — ABNORMAL HIGH (ref 0.50–1.10)
GFR calc Af Amer: 50 mL/min — ABNORMAL LOW (ref >90)
GFR calc Af Amer: 49 mL/min — ABNORMAL LOW (ref >90)
GFR calc non Af Amer: 43 mL/min — ABNORMAL LOW (ref >90)
GFR, EST NON AFRICAN AMERICAN: 43 mL/min — AB (ref >90)
Glucose, Bld: 156 mg/dL — ABNORMAL HIGH (ref 70–99)
Glucose, Bld: 185 mg/dL — ABNORMAL HIGH (ref 70–99)
POTASSIUM: 4.1 meq/L (ref 3.7–5.3)
POTASSIUM: 4.3 meq/L (ref 3.7–5.3)
SODIUM: 136 meq/L — AB (ref 137–147)
Sodium: 133 mEq/L — ABNORMAL LOW (ref 137–147)

## 2014-04-05 LAB — PREPARE RBC (CROSSMATCH)

## 2014-04-05 LAB — GLUCOSE, CAPILLARY
GLUCOSE-CAPILLARY: 150 mg/dL — AB (ref 70–99)
GLUCOSE-CAPILLARY: 165 mg/dL — AB (ref 70–99)
Glucose-Capillary: 174 mg/dL — ABNORMAL HIGH (ref 70–99)
Glucose-Capillary: 145 mg/dL — ABNORMAL HIGH (ref 70–99)

## 2014-04-05 MED ORDER — CITALOPRAM HYDROBROMIDE 40 MG PO TABS: 20.0000 mg | ORAL_TABLET | Freq: Every day | ORAL | Status: DC

## 2014-04-05 MED ORDER — ACETAMINOPHEN 325 MG PO TABS: 650.0000 mg | ORAL_TABLET | Freq: Four times a day (QID) | ORAL | Status: DC | PRN

## 2014-04-05 MED ORDER — CITALOPRAM HYDROBROMIDE 20 MG PO TABS
20.0000 mg | ORAL_TABLET | Freq: Every day | ORAL | Status: DC
  Administered 2014-04-06: 20 mg via ORAL
  Filled 2014-04-05: qty 1

## 2014-04-05 MED ORDER — ASPIRIN 81 MG PO CHEW
81.0000 mg | CHEWABLE_TABLET | Freq: Every day | ORAL | Status: DC
  Administered 2014-04-05 – 2014-04-06 (×2): 81 mg via ORAL
  Filled 2014-04-05 (×2): qty 1

## 2014-04-05 MED ORDER — CITALOPRAM HYDROBROMIDE 20 MG PO TABS: 20.0000 mg | ORAL_TABLET | Freq: Every day | ORAL | Status: DC

## 2014-04-05 MED ORDER — FUROSEMIDE 10 MG/ML IJ SOLN
10.0000 mg | Freq: Once | INTRAMUSCULAR | Status: AC
  Administered 2014-04-05: 10 mg via INTRAVENOUS
  Filled 2014-04-05: qty 2

## 2014-04-05 MED ORDER — OXYCODONE HCL 5 MG PO TABS: 5.0000 mg | ORAL_TABLET | ORAL | Status: DC | PRN

## 2014-04-05 MED ORDER — DSS 100 MG PO CAPS: 100.0000 mg | ORAL_CAPSULE | Freq: Two times a day (BID) | ORAL | Status: DC

## 2014-04-05 MED ORDER — ACETAMINOPHEN 325 MG PO TABS
650.0000 mg | ORAL_TABLET | Freq: Once | ORAL | Status: AC
  Administered 2014-04-05: 650 mg via ORAL
  Filled 2014-04-05: qty 2

## 2014-04-05 NOTE — Progress Notes (Signed)
Physical Therapy Treatment Patient Details Name: Tonya [name] MRN: [id number] DOB: August 18, 1941 Today's Date: 04/05/2014    History of Present Illness s/p LTKA    PT Comments    Pt was able to ambulate but only very little and had to discontinue due to nausea. Pt was having trouble staying alert and awake during exercises and not following instructions very well. Pt is not very motivated to walk or do exercises. Continue to recommend SNF for ongoing Physical Therapy.     Follow Up Recommendations  SNF     Equipment Recommendations  Rolling walker with 5" wheels;3in1 (PT)    Recommendations for Other Services       Precautions / Restrictions Precautions Precautions: Knee Restrictions Weight Bearing Restrictions: Yes LLE Weight Bearing: Partial weight bearing LLE Partial Weight Bearing Percentage or Pounds: 50%    Mobility  Bed Mobility Overal bed mobility: Needs Assistance Bed Mobility: Supine to Sit     Supine to sit: Min guard     General bed mobility comments: Pt. utilized bed rails to bring trunk up to sitting. cues on how to move legs OOB.  Transfers Overall transfer level: Needs assistance Equipment used: Rolling walker (2 wheeled) Transfers: Sit to/from Stand Sit to Stand: Min assist         General transfer comment: cues for hand placement and technique. Assist to power up into upright standing; also to control descent from stand to sit with cues for leg positioning for proper sitting technique.  Ambulation/Gait Ambulation/Gait assistance: Min guard Ambulation Distance (Feet): 10 Feet Assistive device: Rolling walker (2 wheeled) Gait Pattern/deviations: Step-to pattern;Decreased stride length;Antalgic Gait velocity: very decreased Gait velocity interpretation: Below normal speed for age/gender General Gait Details: NO knee buckling. Cues for correct posture.    Stairs            Wheelchair Mobility    Modified Rankin (Stroke Patients  Only)       Balance                                    Cognition Arousal/Alertness: Lethargic;Suspect due to medications Behavior During Therapy: [facility] for tasks assessed/performed Overall Cognitive Status: Within Functional Limits for tasks assessed                      Exercises Total Joint Exercises Ankle Circles/Pumps: AROM;20 reps;Seated;Both Quad Sets: AROM;Strengthening;Left;10 reps;Seated Heel Slides: AAROM;Left;Seated;5 reps;Limitations Heel Slides Limitations: pt. had trouble following cues and commands. Hip ABduction/ADduction: AAROM;Seated;Left;10 reps Straight Leg Raises: AAROM;Left;5 reps;Seated;Limitations Straight Leg Raises Limitations: pt. had trouble focusing and folling cues and stated that these "hurt too bad"  Long Arc Quad: AAROM;Left;10 reps;Seated    General Comments        Pertinent Vitals/Pain 6/10. Pt was repositioned for comfort per RN not time for pain meds.      Home Living                      Prior Function            PT Goals (current goals can now be found in the care plan section) Progress towards PT goals: Progressing toward goals    Frequency  7X/week    PT Plan Current plan remains appropriate    Co-evaluation             End of Session Equipment Utilized During Treatment: Gait belt  Activity Tolerance: Patient limited by pain;Patient limited by lethargy Patient left: in chair;with call bell/phone within reach     Time: 1004-1035 PT Time Calculation (min): 31 min  Charges:  $Gait Training: 8-22 mins $Therapeutic Exercise: 8-22 mins                    G Codes:      Jacqualyn Posey 04/05/2014, 11:57 AM 04/05/2014 Jacqualyn Posey PTA [phone] pager [phone] office

## 2014-04-05 NOTE — Discharge Summary (Signed)
[name], MD   Biagio Borg, PA-C [address], [place], [place]  [address]                             [phone]  PATIENT ID: Tonya [name]        MRN:  [id number]          DOB/AGE: 05-Sep-1941 / 73 y.o.    DISCHARGE SUMMARY  ADMISSION DATE:    04/03/2014 DISCHARGE DATE:   04/06/2014   ADMISSION DIAGNOSIS: LEFT KNEE OSTEOARTHRITIS    DISCHARGE DIAGNOSIS:  LEFT KNEE OSTEOARTHRITIS    ADDITIONAL DIAGNOSIS: Active Problems:   S/P total knee replacement using cement  Past Medical History  Diagnosis Date  . Osteoarthritis   . Edema, peripheral   . CAD (coronary artery disease)     s/p BMS to LAD  . Breast pain     left  . Hyperlipidemia   . Hypertension   . Sinusitis     acute- NOS  . Allergic rhinitis   . Adenocarcinoma, colon   . Sciatica   . Cystitis, acute   . Chronic diarrhea   . GERD (gastroesophageal reflux disease)   . Depression   . Anxiety   . Osteoarthritis   . IBS (irritable bowel syndrome)   . Degenerative disk disease   . Carpal tunnel syndrome   . Diabetes mellitus, type 2   . History of pneumonia   . Nocturia   . Fall at home 03/16/2014    no fx; multiple facial lacerations, abrasions and hematoma  . Dysrhythmia     h/o AFIB  . S/P coronary artery stent placement 2007  . Anemia     iron deficiency    PROCEDURE: Procedure(s): TOTAL KNEE ARTHROPLASTY Left on 04/03/2014  CONSULTS: none     HISTORY: Emilene is a very pleasant 73 year old white female who is seen today for followup evaluation of her left knee. She has had a fair amount of arthritis in her left knee and she is also having pain with now every step. She has even gone to having to use a cane. She has nighttime pain also. She has been tried previously with anti-inflammatories, however, because of her cardiologist, she is not to take any of the anti-inflammatories even though she occasionally sneaks an Aleve on a daily basis. We have done corticosteroid injections as well as  viscosupplementation. She has chronic constant pain which is certainly interfering with her activities of daily living and certainly with her life. She is also now having giving way in the medial joint line which is causing the possibilities of her falling. In reality she comes in today with a fairly large facial hematoma on the left hand side. Seen today for evaluation. Family doctor is [name], internist Dr. Ardis Hughs, cardiologist Dr. Shara Blazing COURSE:  CHENELLE ELFERS is a 73 y.o. admitted on 04/03/2014 and found to have a diagnosis of [place].  After appropriate laboratory studies were obtained  they were taken to the operating room on 04/03/2014 and underwent  Procedure(s): TOTAL KNEE ARTHROPLASTY  Left.   They were given perioperative antibiotics:  Anti-infectives   Start     Dose/Rate Route Frequency Ordered Stop   04/03/14 2000  vancomycin (VANCOCIN) IVPB 1000 mg/200 mL premix     1,000 mg 200 mL/hr over 60 Minutes Intravenous Every 12 hours 04/03/14 1051 04/03/14 2142   04/03/14 0600  vancomycin (  VANCOCIN) IVPB 1000 mg/200 mL premix     1,000 mg 200 mL/hr over 60 Minutes Intravenous On call to O.R. 04/02/14 1433 04/03/14 0750    .  Tolerated the procedure well.  Placed with a foley intraoperatively.    Toradol was given post op.  POD #1, allowed out of bed to a chair.  PT for ambulation and exercise program.  Foley D/C'd in morning.  IV saline locked.  O2 discontionued.  Hemovac pulled out accidentally.  POD #2, continued PT and ambulation.  Difficulty with nausea and unable to participate PT. Given 2 units PRBC with lasix 10 mg betweeen units.  Robaxin stopped. Narcotics held because of family concern for confusion.  POD #3, felt better today after blood. Appeared oriented today knowing me as well as where she will be going.  The remainder of the hospital course was dedicated to ambulation and strengthening.   The patient was discharged on 3 Days Post-Op in   Stable condition.  Blood products given:    two units PRBC  DIAGNOSTIC STUDIES: Recent vital signs:  Patient Vitals for the past 24 hrs:  BP Temp Temp src Pulse Resp SpO2  04/06/14 0544 136/54 mmHg 98.3 F (36.8 C) Oral 68 18 95 %  04/06/14 0300 133/52 mmHg 98.2 F (36.8 C) Oral 66 18 95 %  04/06/14 0014 140/48 mmHg 98.9 F (37.2 C) Oral 69 18 91 %  04/05/14 2344 136/49 mmHg 99.8 F (37.7 C) Oral 70 18 -  04/05/14 2125 152/58 mmHg 99.2 F (37.3 C) Oral 74 18 95 %  04/05/14 2035 125/40 mmHg 99.5 F (37.5 C) Oral 69 18 95 %  04/05/14 1853 131/49 mmHg 99.4 F (37.4 C) Oral 73 18 94 %  04/05/14 1825 145/52 mmHg 99.7 F (37.6 C) Oral 74 18 97 %  04/05/14 1634 146/55 mmHg - - 78 - -  04/05/14 1300 129/61 mmHg 99.4 F (37.4 C) - 72 18 93 %       Recent laboratory studies:  Recent Labs  04/04/14 0630 04/04/14 1620 04/05/14 0457 04/05/14 1022  WBC 7.9 9.6 10.6* 10.5  HGB 8.0* 8.6* 7.7* 8.1*  HCT 23.3* 24.8* 22.6* 23.7*  PLT 140* 142* 134* 137*    Recent Labs  04/04/14 0630 04/05/14 0457 04/05/14 1022  NA 139 133* 136*  K 4.1 4.1 4.3  CL 106 99 102  CO2 19 18* 19  BUN 20 22 20   CREATININE 1.29* 1.20* 1.22*  GLUCOSE 132* 156* 185*  CALCIUM 7.4* 7.4* 7.4*   Lab Results  Component Value Date   INR 1.02 03/21/2014   INR 0.90 05/11/2012   INR 1.04 03/02/2012     Recent Radiographic Studies :  Dg Chest 2 View  04/05/2014   CLINICAL DATA:  Postoperative knee replaced  EXAM: CHEST  2 VIEW  COMPARISON:  March 21, 2014  FINDINGS: The lungs are clear. The heart size and pulmonary vascularity are normal. No adenopathy. There is degenerative change in the thoracic spine. There is an old healed fracture of the mid right clavicle.  IMPRESSION: No edema or consolidation.  No appreciable atelectatic change.   Electronically Signed   By: Lowella Grip M.D.   On: 04/05/2014 09:43   Chest 2 View  03/21/2014   CLINICAL DATA:  Knee replacement.  Preoperative chest x-ray.  EXAM:  CHEST  2 VIEW  COMPARISON:  03/02/2012.  FINDINGS: Mediastinum and hilar structures are normal. The lungs are clear. No pleural effusion or pneumothorax. Cardiomegaly  with normal pulmonary vascularity. No acute bony abnormality.  IMPRESSION: No acute abnormality.   Electronically Signed   By: Marcello Moores  Register   On: 03/21/2014 14:12    DISCHARGE INSTRUCTIONS:     Discharge Instructions   CPM    Complete by:  As directed   Continuous passive motion machine (CPM):      Use the CPM from 0 to 60 for 6-8 hours per day.      You may increase by 5-10 per day.  You may break it up into 2 or 3 sessions per day.      Use CPM for 3-4 weeks or until you are told to stop.     Call MD / Call 911    Complete by:  As directed   If you experience chest pain or shortness of breath, CALL 911 and be transported to the hospital emergency room.  If you develop a fever above 101 F, pus (white drainage) or increased drainage or redness at the wound, or calf pain, call your surgeon's office.     Change dressing    Complete by:  As directed   Change dressing on SUNDAY, then change the dressing daily with sterile 4 x 4 inch gauze dressing and apply TED hose.  You may clean the incision with alcohol prior to redressing.     Constipation Prevention    Complete by:  As directed   Drink plenty of fluids.  Prune juice and/or coffee may be helpful.  You may use a stool softener, such as Colace (over the counter) 100 mg twice a day.  Use MiraLax (over the counter) for constipation as needed but this may take several days to work.  Mag Citrate --OR-- Milk of Magnesia --OR -- Dulcolax pills/suppositories may also be used but follow directions on the label.     Diet general    Complete by:  As directed      Discharge instructions    Complete by:  As directed   YOU WERE GIVEN A DEVICE CALLED AN INCENTIVE SPIROMETER TO HELP YOU TAKE DEEP BREATHS.  PLEASE USE THIS AT LEAST TEN (10) TIMES EVERY 1-2 HOURS EVERY DAY TO PREVENT  PNEUMONIA.     Do not put a pillow under the knee. Place it under the heel.    Complete by:  As directed      Driving restrictions    Complete by:  As directed   No driving for 6 weeks     Increase activity slowly as tolerated    Complete by:  As directed      Lifting restrictions    Complete by:  As directed   No lifting for 6 weeks     Partial weight bearing    Complete by:  As directed   50 % WEIGHT BEARING AS TAUGHT IN PHYSICAL THERAPY     Patient may shower    Complete by:  As directed   You may shower over the brown dressing.  Once the dressing is removed you may shower without a dressing once there is no drainage.  Do not wash over the wound.  If drainage remains, cover wound with plastic wrap and then shower.     TED hose    Complete by:  As directed   Use stockings (TED hose) for 2 weeks on operative leg(s).  You may remove them at night for sleeping.           DISCHARGE MEDICATIONS:  Medication List    STOP taking these medications       nabumetone 750 MG tablet  Commonly known as:  RELAFEN     potassium chloride SA 20 MEQ tablet  Commonly known as:  K-DUR,KLOR-CON      TAKE these medications       acetaminophen 325 MG tablet  Commonly known as:  TYLENOL  Take 2 tablets (650 mg total) by mouth every 6 (six) hours as needed for mild pain (or Fever >/= 101).     aspirin 81 MG tablet  Take 81 mg by mouth daily.     atorvastatin 20 MG tablet  Commonly known as:  LIPITOR  Take 1 tablet (20 mg total) by mouth daily.     calcium-vitamin D 500-200 MG-UNIT per tablet  Commonly known as:  OSCAL WITH D  Take 1 tablet by mouth 2 (two) times daily.     carvedilol 12.5 MG tablet  Commonly known as:  COREG  Take 12.5 mg by mouth 2 (two) times daily with a meal.     cetirizine 10 MG tablet  Commonly known as:  ZYRTEC  Take 10 mg by mouth daily as needed for allergies.     cholestyramine light 4 G packet  Commonly known as:  PREVALITE  Take 4 g by mouth 2  (two) times daily.     citalopram 40 MG tablet  Commonly known as:  CELEXA  Take 0.5 tablets (20 mg total) by mouth daily.     citalopram 20 MG tablet  Commonly known as:  CELEXA  Take 1 tablet (20 mg total) by mouth daily.     diltiazem 240 MG 24 hr capsule  Commonly known as:  CARDIZEM CD  Take 240 mg by mouth daily.     DSS 100 MG Caps  Take 100 mg by mouth 2 (two) times daily.     ferrous sulfate 325 (65 FE) MG EC tablet  Take 325 mg by mouth 2 (two) times daily.     glipizide-metformin 2.5-250 MG per tablet  Commonly known as:  METAGLIP  Take 2 tablets by mouth 2 (two) times daily before a meal.     losartan-hydrochlorothiazide 100-12.5 MG per tablet  Commonly known as:  HYZAAR  Take 1 tablet by mouth daily.     multivitamin with minerals Tabs tablet  Take 1 tablet by mouth daily.     mupirocin nasal ointment 2 %  Commonly known as:  BACTROBAN  Place 1 application into the nose 2 (two) times daily. Use one-half of tube in each nostril twice daily for five (5) days. After application, press sides of nose together and gently massage.     nystatin cream  Commonly known as:  MYCOSTATIN  Apply 1 application topically 2 (two) times daily as needed for dry skin.     omeprazole 20 MG capsule  Commonly known as:  PRILOSEC  Take 40 mg by mouth daily.     oxyCODONE 5 MG immediate release tablet  Commonly known as:  Oxy IR/ROXICODONE  Take 1-2 tablets (5-10 mg total) by mouth every 4 (four) hours as needed for moderate pain, severe pain or breakthrough pain.        FOLLOW UP VISIT:   Follow-up Information   Follow up with [facility], Vonna Kotyk, MD. Schedule an appointment as soon as possible for a visit on 04/16/2014.   Specialty:  Orthopedic Surgery   Contact information:   [place]. [place]  [place]:   Skilled Nursing Facility/Rehab  CONDITION:  Stable   Mike Craze. [place], [place] [phone]  04/06/2014 8:18 AM   04/06/2014, 8:18 AM

## 2014-04-05 NOTE — Progress Notes (Signed)
Patient's family very concerned about patient's confusion from earlier in admission.  Patient did not appear to be confused upon interaction with her during the day.  Family requesting that no narcotics be given to patient.  Dr. Marlou Sa on call for Dr. Durward Fortes, instructions to hold narcotics until 11PM tonight 04/05/14.  NIght shift made aware of this.  Nursing will continue to monitor.

## 2014-04-05 NOTE — Progress Notes (Signed)
Patient ID: Tonya [name], female   DOB: 07/06/1941, 73 y.o.   MRN: [id number] PATIENT ID: Tonya [name]        MRN:  [id number]          DOB/AGE: Feb 29, 1941 / 73 y.o.    [name], MD   Biagio Borg, PA-C [address] [place], [place]  [address]                             [phone]   PROGRESS NOTE  Subjective:  negative for Chest Pain  negative for Shortness of Breath  negative for Nausea/Vomiting   negative for Calf Pain    Tolerating Diet: yes         Patient reports pain as moderate.     Now dizzy when OOB  Objective: Vital signs in last 24 hours:   Patient Vitals for the past 24 hrs:  BP Temp Temp src Pulse Resp SpO2  04/05/14 1300 129/61 mmHg 99.4 F (37.4 C) - 72 18 93 %  04/05/14 0452 163/54 mmHg 100.1 F (37.8 C) Oral 82 18 91 %  04/04/14 2320 - 99.7 F (37.6 C) Oral - - -  04/04/14 2039 150/47 mmHg 102.1 F (38.9 C) Oral 78 18 91 %  04/04/14 1659 122/43 mmHg - - - - -      Intake/Output from previous day:   07/08 0701 - 07/09 0700 In: 720 [P.O.:720] Out: 50 [Drains:50]   Intake/Output this shift:   07/09 0701 - 07/09 1900 In: 264 [P.O.:264] Out: 800 [Urine:800]   Intake/Output     07/08 0701 - 07/09 0700 07/09 0701 - 07/10 0700   P.O. 720 264   I.V. (mL/kg)     Total Intake(mL/kg) 720 (9.1) 264 (3.3)   Urine (mL/kg/hr)  800 (1.1)   Drains 50 (0)    Blood     Total Output 50 800   Net +670 -536        Urine Occurrence 2 x       LABORATORY DATA:  Recent Labs  04/04/14 0630 04/04/14 1620 04/05/14 0457 04/05/14 1022  WBC 7.9 9.6 10.6* 10.5  HGB 8.0* 8.6* 7.7* 8.1*  HCT 23.3* 24.8* 22.6* 23.7*  PLT 140* 142* 134* 137*    Recent Labs  04/04/14 0630 04/05/14 0457 04/05/14 1022  NA 139 133* 136*  K 4.1 4.1 4.3  CL 106 99 102  CO2 19 18* 19  BUN 20 22 20   CREATININE 1.29* 1.20* 1.22*  GLUCOSE 132* 156* 185*  CALCIUM 7.4* 7.4* 7.4*   Lab Results  Component Value Date   INR 1.02 03/21/2014   INR 0.90 05/11/2012     INR 1.04 03/02/2012    Recent Radiographic Studies :  Dg Chest 2 View  04/05/2014   CLINICAL DATA:  Postoperative knee replaced  EXAM: CHEST  2 VIEW  COMPARISON:  March 21, 2014  FINDINGS: The lungs are clear. The heart size and pulmonary vascularity are normal. No adenopathy. There is degenerative change in the thoracic spine. There is an old healed fracture of the mid right clavicle.  IMPRESSION: No edema or consolidation.  No appreciable atelectatic change.   Electronically Signed   By: Lowella Grip M.D.   On: 04/05/2014 09:43   Chest 2 View  03/21/2014   CLINICAL DATA:  Knee replacement.  Preoperative chest x-ray.  EXAM: CHEST  2 VIEW  COMPARISON:  03/02/2012.  FINDINGS: Mediastinum  and hilar structures are normal. The lungs are clear. No pleural effusion or pneumothorax. Cardiomegaly with normal pulmonary vascularity. No acute bony abnormality.  IMPRESSION: No acute abnormality.   Electronically Signed   By: Marcello Moores  Register   On: 03/21/2014 14:12     Examination:  General appearance: alert, cooperative and no distress  Wound Exam: clean, dry, intact   Drainage:  None: wound tissue dry  Motor Exam: EHL, FHL, Anterior Tibial and Posterior Tibial Intact  Sensory Exam: Superficial Peroneal, Deep Peroneal and Tibial normal  Vascular Exam: Normal  Assessment:    2 Days Post-Op  Procedure(s) (LRB): TOTAL KNEE ARTHROPLASTY (Left)  ADDITIONAL DIAGNOSIS:  Active Problems:   S/P total knee replacement using cement  Acute Blood Loss Anemia-now symptomatic-will transfuse 2 units pc with lasix in between   Plan: Physical Therapy as ordered Partial Weight Bearing @ 50% (PWB)  DVT Prophylaxis:  Xarelto  DISCHARGE PLAN: Skilled Nursing Facility/Rehab  DISCHARGE NEEDS: HHPT, CPM, Walker and 3-in-1 comode seat  Dressing changed-some bleeding from hemovac site, no calf pain. Pt symptomatic with dizzyness when OOB and with hx of CAD will transfuse 2 units packed cells. Will also  cut celexa dose in half per pharmacy and stop xarelto and start ASA. Hope for D/C to [facility] in am     Mike Craze. [place], [place] [phone]  04/05/2014 3:52 PM  04/05/2014 3:52 PM

## 2014-04-06 DIAGNOSIS — I10 Essential (primary) hypertension: Secondary | ICD-10-CM | POA: Diagnosis not present

## 2014-04-06 DIAGNOSIS — F411 Generalized anxiety disorder: Secondary | ICD-10-CM | POA: Diagnosis not present

## 2014-04-06 DIAGNOSIS — K219 Gastro-esophageal reflux disease without esophagitis: Secondary | ICD-10-CM | POA: Diagnosis not present

## 2014-04-06 DIAGNOSIS — M25569 Pain in unspecified knee: Secondary | ICD-10-CM | POA: Diagnosis not present

## 2014-04-06 DIAGNOSIS — R609 Edema, unspecified: Secondary | ICD-10-CM | POA: Diagnosis not present

## 2014-04-06 DIAGNOSIS — I2510 Atherosclerotic heart disease of native coronary artery without angina pectoris: Secondary | ICD-10-CM | POA: Diagnosis not present

## 2014-04-06 DIAGNOSIS — E119 Type 2 diabetes mellitus without complications: Secondary | ICD-10-CM | POA: Diagnosis not present

## 2014-04-06 DIAGNOSIS — Z96659 Presence of unspecified artificial knee joint: Secondary | ICD-10-CM | POA: Diagnosis not present

## 2014-04-06 DIAGNOSIS — D509 Iron deficiency anemia, unspecified: Secondary | ICD-10-CM | POA: Diagnosis not present

## 2014-04-06 DIAGNOSIS — Z5189 Encounter for other specified aftercare: Secondary | ICD-10-CM | POA: Diagnosis not present

## 2014-04-06 DIAGNOSIS — J309 Allergic rhinitis, unspecified: Secondary | ICD-10-CM | POA: Diagnosis not present

## 2014-04-06 DIAGNOSIS — Z471 Aftercare following joint replacement surgery: Secondary | ICD-10-CM | POA: Diagnosis not present

## 2014-04-06 DIAGNOSIS — K589 Irritable bowel syndrome without diarrhea: Secondary | ICD-10-CM | POA: Diagnosis not present

## 2014-04-06 DIAGNOSIS — F329 Major depressive disorder, single episode, unspecified: Secondary | ICD-10-CM | POA: Diagnosis not present

## 2014-04-06 DIAGNOSIS — F3289 Other specified depressive episodes: Secondary | ICD-10-CM | POA: Diagnosis not present

## 2014-04-06 DIAGNOSIS — I82409 Acute embolism and thrombosis of unspecified deep veins of unspecified lower extremity: Secondary | ICD-10-CM | POA: Diagnosis not present

## 2014-04-06 DIAGNOSIS — M1990 Unspecified osteoarthritis, unspecified site: Secondary | ICD-10-CM | POA: Diagnosis not present

## 2014-04-06 DIAGNOSIS — E785 Hyperlipidemia, unspecified: Secondary | ICD-10-CM | POA: Diagnosis not present

## 2014-04-06 DIAGNOSIS — S8990XA Unspecified injury of unspecified lower leg, initial encounter: Secondary | ICD-10-CM | POA: Diagnosis not present

## 2014-04-06 DIAGNOSIS — M1710 Unilateral primary osteoarthritis, unspecified knee: Secondary | ICD-10-CM | POA: Diagnosis not present

## 2014-04-06 DIAGNOSIS — S99919A Unspecified injury of unspecified ankle, initial encounter: Secondary | ICD-10-CM | POA: Diagnosis not present

## 2014-04-06 LAB — TYPE AND SCREEN
ABO/RH(D): A POS
Antibody Screen: NEGATIVE
UNIT DIVISION: 0
Unit division: 0

## 2014-04-06 LAB — CBC
HCT: 28.6 % — ABNORMAL LOW (ref 36.0–46.0)
HEMOGLOBIN: 9.9 g/dL — AB (ref 12.0–15.0)
MCH: 31.1 pg (ref 26.0–34.0)
MCHC: 34.6 g/dL (ref 30.0–36.0)
MCV: 89.9 fL (ref 78.0–100.0)
Platelets: 131 10*3/uL — ABNORMAL LOW (ref 150–400)
RBC: 3.18 MIL/uL — ABNORMAL LOW (ref 3.87–5.11)
RDW: 13.9 % (ref 11.5–15.5)
WBC: 10.6 10*3/uL — ABNORMAL HIGH (ref 4.0–10.5)

## 2014-04-06 LAB — BASIC METABOLIC PANEL
Anion gap: 18 — ABNORMAL HIGH (ref 5–15)
BUN: 20 mg/dL (ref 6–23)
CALCIUM: 7.9 mg/dL — AB (ref 8.4–10.5)
CO2: 19 meq/L (ref 19–32)
CREATININE: 1.11 mg/dL — AB (ref 0.50–1.10)
Chloride: 101 mEq/L (ref 96–112)
GFR calc Af Amer: 55 mL/min — ABNORMAL LOW (ref >90)
GFR calc non Af Amer: 48 mL/min — ABNORMAL LOW (ref >90)
GLUCOSE: 154 mg/dL — AB (ref 70–99)
Potassium: 4.7 mEq/L (ref 3.7–5.3)
Sodium: 138 mEq/L (ref 137–147)

## 2014-04-06 LAB — GLUCOSE, CAPILLARY
GLUCOSE-CAPILLARY: 148 mg/dL — AB (ref 70–99)
Glucose-Capillary: 135 mg/dL — ABNORMAL HIGH (ref 70–99)

## 2014-04-06 NOTE — Progress Notes (Signed)
Patient ready for discharge. Waiting on transportation.Marland Kitchen

## 2014-04-06 NOTE — Progress Notes (Signed)
Patient d/c to SNF.  Report called to SNF.

## 2014-04-06 NOTE — Progress Notes (Addendum)
Clinical Social Worker facilitated patient discharge including contacting patient and facility to confirm patient discharge plans.  Clinical information faxed to facility and patient agreeable with plan.  Patient states that she will call her daughter to inform. CSW arranged ambulance transport via [facility] to EMS for 2:30pm.  RN to call report prior to discharge.  Clinical Social Worker will sign off for now as social work intervention is no longer needed. Please consult [place] again if new need arises.  Jeanette Caprice, MSW, [place]

## 2014-04-06 NOTE — Progress Notes (Signed)
Clinical Social Work Department CLINICAL SOCIAL WORK PLACEMENT NOTE 04/06/2014  Patient:  Tonya [name], Tonya [name]  Account Number:  [id number] Admit date:  04/03/2014  Clinical Social Worker:  Megan Salon  Date/time:  04/03/2014 11:56 AM  Clinical Social Work is seeking post-discharge placement for this patient at the following level of care:   [place]   (*CSW will update this form in Epic as items are completed)   04/03/2014  Patient/family provided with [place] Department of Clinical Social Work's list of facilities offering this level of care within the geographic area requested by the patient (or if unable, by the patient's family).  04/03/2014  Patient/family informed of their freedom to choose among providers that offer the needed level of care, that participate in Medicare, Medicaid or managed care program needed by the patient, have an available bed and are willing to accept the patient.  04/03/2014  Patient/family informed of [facility]' ownership interest in [facility], as well as of the fact that they are under no obligation to receive care at this facility.  PASARR submitted to EDS on  PASARR number received on   FL2 transmitted to all facilities in geographic area requested by pt/family on  04/03/2014 FL2 transmitted to all facilities within larger geographic area on   Patient informed that his/her managed care company has contracts with or will negotiate with  certain facilities, including the following:     Patient/family informed of bed offers received:  04/04/2014 Patient chooses bed at [facility] [place] Physician recommends and patient chooses bed at    Patient to be transferred to [place] on  04/06/2014 Patient to be transferred to facility by EMS Patient and family notified of transfer on 04/06/2014 Name of family member notified:  DAUGHTER  The following physician request were  entered in Epic:   Additional Comments: Patient has existing [place], [place], [place]

## 2014-04-06 NOTE — Progress Notes (Signed)
Patient ID: Tonya [name], female   DOB: 05-04-1941, 73 y.o.   MRN: [id number] PATIENT ID: Tonya [name]        MRN:  [id number]          DOB/AGE: 74-Apr-1942 / 73 y.o.    [name], MD   Biagio Borg, PA-C [address] [place], [place]  [address]                             [phone]   PROGRESS NOTE  Subjective:  negative for Chest Pain  negative for Shortness of Breath  negative for Nausea/Vomiting   negative for Calf Pain    Tolerating Diet: yes         Patient reports pain as mild.     States pain minimal.  Resting comfortably.  Objective: Vital signs in last 24 hours:   Patient Vitals for the past 24 hrs:  BP Temp Temp src Pulse Resp SpO2  04/06/14 0544 136/54 mmHg 98.3 F (36.8 C) Oral 68 18 95 %  04/06/14 0300 133/52 mmHg 98.2 F (36.8 C) Oral 66 18 95 %  04/06/14 0014 140/48 mmHg 98.9 F (37.2 C) Oral 69 18 91 %  04/05/14 2344 136/49 mmHg 99.8 F (37.7 C) Oral 70 18 -  04/05/14 2125 152/58 mmHg 99.2 F (37.3 C) Oral 74 18 95 %  04/05/14 2035 125/40 mmHg 99.5 F (37.5 C) Oral 69 18 95 %  04/05/14 1853 131/49 mmHg 99.4 F (37.4 C) Oral 73 18 94 %  04/05/14 1825 145/52 mmHg 99.7 F (37.6 C) Oral 74 18 97 %  04/05/14 1634 146/55 mmHg - - 78 - -  04/05/14 1300 129/61 mmHg 99.4 F (37.4 C) - 72 18 93 %      Intake/Output from previous day:   07/09 0701 - 07/10 0700 In: 289 [P.O.:264] Out: 800 [Urine:800]   Intake/Output this shift:       Intake/Output     07/09 0701 - 07/10 0700 07/10 0701 - 07/11 0700   P.O. 264    Blood 25    Total Intake(mL/kg) 289 (3.6)    Urine (mL/kg/hr) 800 (0.4)    Drains     Total Output 800     Net -511          Urine Occurrence 2 x       LABORATORY DATA:  Recent Labs  04/04/14 0630 04/04/14 1620 04/05/14 0457 04/05/14 1022  WBC 7.9 9.6 10.6* 10.5  HGB 8.0* 8.6* 7.7* 8.1*  HCT 23.3* 24.8* 22.6* 23.7*  PLT 140* 142* 134* 137*    Recent Labs  04/04/14 0630 04/05/14 0457 04/05/14 1022  NA  139 133* 136*  K 4.1 4.1 4.3  CL 106 99 102  CO2 19 18* 19  BUN 20 22 20   CREATININE 1.29* 1.20* 1.22*  GLUCOSE 132* 156* 185*  CALCIUM 7.4* 7.4* 7.4*   Lab Results  Component Value Date   INR 1.02 03/21/2014   INR 0.90 05/11/2012   INR 1.04 03/02/2012    Recent Radiographic Studies :  Dg Chest 2 View  04/05/2014   CLINICAL DATA:  Postoperative knee replaced  EXAM: CHEST  2 VIEW  COMPARISON:  March 21, 2014  FINDINGS: The lungs are clear. The heart size and pulmonary vascularity are normal. No adenopathy. There is degenerative change in the thoracic spine. There is an old healed fracture of the mid right clavicle.  IMPRESSION: No edema or consolidation.  No appreciable atelectatic change.   Electronically Signed   By: Lowella Grip M.D.   On: 04/05/2014 09:43   Chest 2 View  03/21/2014   CLINICAL DATA:  Knee replacement.  Preoperative chest x-ray.  EXAM: CHEST  2 VIEW  COMPARISON:  03/02/2012.  FINDINGS: Mediastinum and hilar structures are normal. The lungs are clear. No pleural effusion or pneumothorax. Cardiomegaly with normal pulmonary vascularity. No acute bony abnormality.  IMPRESSION: No acute abnormality.   Electronically Signed   By: Marcello Moores  Register   On: 03/21/2014 14:12     Examination:  General appearance: alert, cooperative and mild distress Resp: clear to auscultation bilaterally Cardio: regular rate and rhythm GI: normal findings: bowel sounds normal  Oriented to person, place, and where she plans to go for rehab  Wound Exam: clean, dry, intact   Drainage:  Scant/small amount Serosanguinous exudate  Motor Exam: EHL, FHL, Anterior Tibial and Posterior Tibial Intact  Sensory Exam: Superficial Peroneal, Deep Peroneal and Tibial normal  Vascular Exam: Left posterior tibial artery has trace pulse  Assessment:    3 Days Post-Op  Procedure(s) (LRB): TOTAL KNEE ARTHROPLASTY (Left)  ADDITIONAL DIAGNOSIS:  Active Problems:   S/P total knee replacement using  cement  Acute Blood Loss Anemia   Plan: Physical Therapy as ordered Partial Weight Bearing @ 50% (PWB)  DVT Prophylaxis:  Aspirin, Foot Pumps and TED hose  DISCHARGE PLAN: Skilled Nursing Facility/Rehab [facility] today pending labs  DISCHARGE NEEDS: PT, CPM, Walker and 3-in-1 comode seat         Aaron Edelman D. [place], [place] [phone]  04/06/2014 8:06 AM  04/06/2014 8:06 AM

## 2014-04-06 NOTE — Progress Notes (Signed)
Physical Therapy Treatment Patient Details Name: Tonya [name] MRN: [id number] DOB: 03-10-1941 Today's Date: 04/06/2014    History of Present Illness s/p LTKA    PT Comments    Pt had more motivation today to ambulate and do exercises. Pts ROM was increased today and had good strength with LLE. Pts head much more clear and alert today while off of pain meds. Pt. D/C this afternoon to SNF.   Follow Up Recommendations  SNF     Equipment Recommendations  Rolling walker with 5" wheels;3in1 (PT)    Recommendations for Other Services       Precautions / Restrictions Precautions Precautions: Knee Precaution Comments: Educated pt on rationale for propping ankle and no pillow under knee Restrictions Weight Bearing Restrictions: Yes LLE Weight Bearing: Partial weight bearing LLE Partial Weight Bearing Percentage or Pounds: 50    Mobility  Bed Mobility Overal bed mobility: Needs Assistance Bed Mobility: Supine to Sit     Supine to sit: Supervision     General bed mobility comments: Pt. utilized bed rails to bring trunk up to sitting. supervision for safety and cues for hand placement.  Transfers Overall transfer level: Needs assistance Equipment used: Rolling walker (2 wheeled) Transfers: Sit to/from Stand Sit to Stand: Min assist         General transfer comment: assist to power up from standing; also control descent from stand to sit with cues for leg positioning and a controled descent.  Ambulation/Gait Ambulation/Gait assistance: Min guard Ambulation Distance (Feet): 100 Feet Assistive device: Rolling walker (2 wheeled) Gait Pattern/deviations: Step-to pattern;Step-through pattern;Decreased stride length Gait velocity: decreased Gait velocity interpretation: Below normal speed for age/gender General Gait Details: Pt started amb with step-through pattern toward the end with increased gait velocity. cues for posture.   Stairs            Wheelchair  Mobility    Modified Rankin (Stroke Patients Only)       Balance                                    Cognition Arousal/Alertness: Awake/alert Behavior During Therapy: WFL for tasks assessed/performed Overall Cognitive Status: Within Functional Limits for tasks assessed                      Exercises Total Joint Exercises Ankle Circles/Pumps: AROM;20 reps;Seated;Both Quad Sets: AROM;Strengthening;Left;10 reps;Seated Heel Slides: AAROM;Left;Seated;5 reps Hip ABduction/ADduction: AAROM;Seated;Left;10 reps Straight Leg Raises: AAROM;Left;5 reps;Seated;Strengthening Long Arc QuadSinclair Ship;Left;5 reps    General Comments        Pertinent Vitals/Pain 7/10. Pt. repostioned for comfort in recliner.     Home Living                      Prior Function            PT Goals (current goals can now be found in the care plan section) Progress towards PT goals: Progressing toward goals    Frequency  7X/week    PT Plan Current plan remains appropriate    Co-evaluation             End of Session Equipment Utilized During Treatment: Gait belt Activity Tolerance: Patient tolerated treatment well Patient left: in chair;with nursing/sitter in room;with family/visitor present;with call bell/phone within reach     Time: 1026-1054 PT Time Calculation (min): 28 min  Charges:  $Gait Training: 8-22  mins $Therapeutic Exercise: 8-22 mins                    G Codes:      Jacqualyn Posey 04/06/2014, 11:00 AM  04/06/2014 Jacqualyn Posey PTA [phone] pager [phone] office

## 2014-04-08 DIAGNOSIS — I2510 Atherosclerotic heart disease of native coronary artery without angina pectoris: Secondary | ICD-10-CM | POA: Diagnosis not present

## 2014-04-08 DIAGNOSIS — E119 Type 2 diabetes mellitus without complications: Secondary | ICD-10-CM | POA: Diagnosis not present

## 2014-04-08 DIAGNOSIS — D509 Iron deficiency anemia, unspecified: Secondary | ICD-10-CM | POA: Diagnosis not present

## 2014-04-08 DIAGNOSIS — I82409 Acute embolism and thrombosis of unspecified deep veins of unspecified lower extremity: Secondary | ICD-10-CM | POA: Diagnosis not present

## 2014-04-13 ENCOUNTER — Other Ambulatory Visit: Payer: Self-pay | Admitting: Family Medicine

## 2014-04-13 ENCOUNTER — Other Ambulatory Visit: Payer: Medicare Other

## 2014-04-16 DIAGNOSIS — M1710 Unilateral primary osteoarthritis, unspecified knee: Secondary | ICD-10-CM | POA: Diagnosis not present

## 2014-04-20 ENCOUNTER — Encounter: Payer: Self-pay | Admitting: Internal Medicine

## 2014-04-20 ENCOUNTER — Encounter: Payer: Self-pay | Admitting: Family Medicine

## 2014-04-20 ENCOUNTER — Ambulatory Visit: Payer: Medicare Other | Admitting: Family Medicine

## 2014-04-23 ENCOUNTER — Other Ambulatory Visit: Payer: Self-pay | Admitting: Family Medicine

## 2014-04-29 ENCOUNTER — Other Ambulatory Visit: Payer: Self-pay | Admitting: Family Medicine

## 2014-04-30 ENCOUNTER — Encounter: Payer: Self-pay | Admitting: Cardiology

## 2014-04-30 ENCOUNTER — Ambulatory Visit (INDEPENDENT_AMBULATORY_CARE_PROVIDER_SITE_OTHER): Payer: Medicare Other | Admitting: Cardiology

## 2014-04-30 VITALS — BP 160/90 | HR 60 | Ht 66.0 in | Wt 169.8 lb

## 2014-04-30 DIAGNOSIS — I4891 Unspecified atrial fibrillation: Secondary | ICD-10-CM | POA: Diagnosis not present

## 2014-04-30 DIAGNOSIS — I10 Essential (primary) hypertension: Secondary | ICD-10-CM | POA: Diagnosis not present

## 2014-04-30 DIAGNOSIS — I2510 Atherosclerotic heart disease of native coronary artery without angina pectoris: Secondary | ICD-10-CM | POA: Diagnosis not present

## 2014-04-30 DIAGNOSIS — I480 Paroxysmal atrial fibrillation: Secondary | ICD-10-CM

## 2014-04-30 NOTE — Progress Notes (Signed)
HPI The patient  coronary disease as described below. Since I last saw her she has had her knee replaced on the left.  With all of this including in patient rehab she has not had any chest pressure, neck or arm discomfort. She has not had any shortness of breath, PND or orthopnea above her previous baseline. She's not had any palpitations, presyncope or syncope. She is to start out patient rehab  Allergies  Allergen Reactions  . Amoxicillin-Pot Clavulanate Swelling    Augmentin per patient, tongue swelling    Current Outpatient Prescriptions  Medication Sig Dispense Refill  . acetaminophen (TYLENOL) 325 MG tablet Take 2 tablets (650 mg total) by mouth every 6 (six) hours as needed for mild pain (or Fever >/= 101).      Marland Kitchen aspirin 81 MG tablet Take 81 mg by mouth daily.      Marland Kitchen atorvastatin (LIPITOR) 20 MG tablet Take 1 tablet (20 mg total) by mouth daily.  90 tablet  0  . calcium-vitamin D (OSCAL WITH D 500-200) 500-200 MG-UNIT per tablet Take 1 tablet by mouth 2 (two) times daily.        . carvedilol (COREG) 12.5 MG tablet TAKE 1 TABLET BY MOUTH 2 TIMES DAILY WITH A MEAL.  180 tablet  1  . cetirizine (ZYRTEC) 10 MG tablet Take 10 mg by mouth daily as needed for allergies.      . cholestyramine light (PREVALITE) 4 G packet Take 4 g by mouth 2 (two) times daily.      . citalopram (CELEXA) 20 MG tablet Take 1 tablet (20 mg total) by mouth daily.  30 tablet  0  . diltiazem (CARDIZEM CD) 240 MG 24 hr capsule Take 240 mg by mouth daily.      Marland Kitchen docusate sodium 100 MG CAPS Take 100 mg by mouth 2 (two) times daily.  10 capsule  0  . ferrous sulfate 325 (65 FE) MG EC tablet Take 325 mg by mouth 2 (two) times daily.      Marland Kitchen glipizide-metformin (METAGLIP) 2.5-250 MG per tablet TAKE 2 TABLETS BY MOUTH TWICE A DAY BEFORE A MEAL  180 tablet  0  . KLOR-CON M20 20 MEQ tablet TAKE 1 TABLET BY MOUTH TWICE A DAY  180 tablet  1  . losartan-hydrochlorothiazide (HYZAAR) 100-12.5 MG per tablet Take 1 tablet by  mouth daily.  90 tablet  3  . Multiple Vitamin (MULTIVITAMIN WITH MINERALS) TABS Take 1 tablet by mouth daily.      . nabumetone (RELAFEN) 750 MG tablet Take 1 tablet by mouth as directed.      . nystatin cream (MYCOSTATIN) Apply 1 application topically 2 (two) times daily as needed for dry skin.      Marland Kitchen omeprazole (PRILOSEC) 20 MG capsule TAKE 2 CAPSULES BY MOUTH EVERY DAY  180 capsule  1   No current facility-administered medications for this visit.    Past Medical History  Diagnosis Date  . Osteoarthritis   . Edema, peripheral   . CAD (coronary artery disease)     s/p BMS to LAD  . Breast pain     left  . Hyperlipidemia   . Hypertension   . Sinusitis     acute- NOS  . Allergic rhinitis   . Adenocarcinoma, colon   . Sciatica   . Cystitis, acute   . Chronic diarrhea   . GERD (gastroesophageal reflux disease)   . Depression   . Anxiety   . Osteoarthritis   .  IBS (irritable bowel syndrome)   . Degenerative disk disease   . Carpal tunnel syndrome   . Diabetes mellitus, type 2   . History of pneumonia   . Nocturia   . Fall at home 03/16/2014    no fx; multiple facial lacerations, abrasions and hematoma  . Dysrhythmia     h/o AFIB  . S/P coronary artery stent placement 2007  . Anemia     iron deficiency    Past Surgical History  Procedure Laterality Date  . Knee arthroscopy      bilateral  . Cholecystectomy    . Cardiac catheterization  12/10    LAD stent patent. Insignificant CAD, otherwise EF 60-65%  . Partial colectomy  11/2008    right, for adenocarcinoma  . Replacement total knee  99991111    right, complicated by hemarthrosis   . Hematoma evacuation      after knee replacement  . Total knee revision  03/08/2012    Procedure: TOTAL KNEE REVISION;  Surgeon: Garald Balding, MD;  Location: [facility];  Service: Orthopedics;  Laterality: Right;  removal total knee hardware and placement of antibiotic cement spacer and antibiotic beads  . Carpal tunnel release    .  Total knee revision  05/17/2012    Procedure: TOTAL KNEE REVISION;  Surgeon: Garald Balding, MD;  Location: [facility];  Service: Orthopedics;  Laterality: Right;  right total knee revision, removal of antibiotic spacer  . Total knee arthroplasty Left 04/03/2014    dr whitfield  . Total knee arthroplasty Left 04/03/2014    Procedure: TOTAL KNEE ARTHROPLASTY;  Surgeon: Garald Balding, MD;  Location: [facility];  Service: Orthopedics;  Laterality: Left;    ROS:  As stated in the HPI and negative for all other systems.  PHYSICAL EXAM BP 160/90  Pulse 60  Ht 5\' 6"  (1.676 m)  Wt 169 lb 12.8 oz (77.021 kg)  BMI 27.42 kg/m2 GENERAL:  Well appearing NECK:  No jugular venous distention, waveform within normal limits, carotid upstroke brisk and symmetric, no bruits, no thyromegaly LUNGS:  Clear to auscultation bilaterally BACK:  No CVA tenderness CHEST:  Unremarkable HEART:  PMI not displaced or sustained,S1 and S2 within normal limits, no S3, no S4, no clicks, no rubs, no murmurs ABD:  Flat, positive bowel sounds normal in frequency in pitch, no bruits, no rebound, no guarding, no midline pulsatile mass, no hepatomegaly, no splenomegaly EXT:  2 plus pulses throughout, mild left leg edema, no cyanosis no clubbing  ASSESSMENT AND PLAN  CAD:  The patient has no new sypmtoms.  No further cardiovascular testing is indicated.  We will continue with aggressive risk reduction and meds as listed.  HTN:  Her BP fluctuates.  She is in pain this AM and will continue the meds as listed while she keeps a BP diary.   DYSLIPIDEMIA:  She is at target with her lipid level and I reviewed his labs from January.  (LDL 61 with HDL of 53).  She will remain on the meds as listed.  DM:  A1C was 5.9 in July

## 2014-04-30 NOTE — Patient Instructions (Signed)
Your physician recommends that you schedule a follow-up appointment in: one year with Dr. Hochrein  

## 2014-05-04 ENCOUNTER — Encounter: Payer: Self-pay | Admitting: Gastroenterology

## 2014-05-11 ENCOUNTER — Ambulatory Visit (INDEPENDENT_AMBULATORY_CARE_PROVIDER_SITE_OTHER): Payer: Medicare Other | Admitting: Family Medicine

## 2014-05-11 ENCOUNTER — Encounter: Payer: Self-pay | Admitting: Family Medicine

## 2014-05-11 VITALS — BP 152/82 | HR 60 | Temp 98.5°F | Wt 166.0 lb

## 2014-05-11 DIAGNOSIS — I10 Essential (primary) hypertension: Secondary | ICD-10-CM | POA: Diagnosis not present

## 2014-05-11 DIAGNOSIS — E785 Hyperlipidemia, unspecified: Secondary | ICD-10-CM | POA: Diagnosis not present

## 2014-05-11 DIAGNOSIS — E119 Type 2 diabetes mellitus without complications: Secondary | ICD-10-CM

## 2014-05-11 DIAGNOSIS — I2510 Atherosclerotic heart disease of native coronary artery without angina pectoris: Secondary | ICD-10-CM

## 2014-05-11 LAB — HM DIABETES FOOT EXAM

## 2014-05-11 NOTE — Assessment & Plan Note (Signed)
Well controlled. Continue current medication. May consider decrease of med if CBGs lower further with further weight loss

## 2014-05-11 NOTE — Assessment & Plan Note (Signed)
Borderline and fluctuating control. Pt will call here or cards if consistently high for med change

## 2014-05-11 NOTE — Progress Notes (Signed)
Pre visit review using our clinic review tool, if applicable. No additional management support is needed unless otherwise documented below in the visit note. 

## 2014-05-11 NOTE — Progress Notes (Signed)
Subjective:    Patient ID: Tonya [name], female    DOB: 01/22/1941, 73 y.o.   MRN: [id number]  HPI   73 year old female presents for follow up after left  knee replacement surgery 03/2014.  No complications with surgery. She healed well.  She is in PT now. Pain is improving with time, she uses tyelnol after PT. Not requiring narcotics.  Hypertension:  Poor control on diltiazem, hyzzar, coreg.  Saw Dr. Percival Spanish 8/7... No recs for change in BP Readings from Last 3 Encounters:  05/11/14 152/82  04/30/14 160/90  04/06/14 136/54  Using medication without problems or lightheadedness: intermtiiant Chest pain with exertion:None Edema:None Short of breath:None Average home BPs: She has fluctuating BPs usually 145-150/60-70 , has keeping a diary of it.  Afib, stable on  Aspirin. Cannot tolerated coumadin or xarelto per pt.   Diabetes:  Stable on glipizide/ metformin. Lab Results  Component Value Date   HGBA1C 5.9* 04/03/2014  Using medications without difficulties: none Hypoglycemic episodes: None Hyperglycemic episodes:None Feet problems: no ulcers Blood Sugars averaging: 88 eye exam within last year: 12/2013 retinopathy.  Wt Readings from Last 3 Encounters:  05/11/14 166 lb (75.297 kg)  04/30/14 169 lb 12.8 oz (77.021 kg)  04/03/14 174 lb 9.6 oz (79.198 kg)        Review of Systems  Constitutional: Negative for fever and fatigue.  HENT: Negative for ear pain.   Eyes: Negative for pain.  Respiratory: Negative for chest tightness and shortness of breath.   Cardiovascular: Positive for leg swelling. Negative for chest pain and palpitations.  Gastrointestinal: Negative for abdominal pain.  Genitourinary: Negative for dysuria.       Objective:   Physical Exam  Constitutional: Vital signs are normal. She appears well-developed and well-nourished. She is cooperative.  Non-toxic appearance. She does not appear ill. No distress.  HENT:  Head: Normocephalic.  Right Ear:  Hearing, tympanic membrane, external ear and ear canal normal. Tympanic membrane is not erythematous, not retracted and not bulging.  Left Ear: Hearing, tympanic membrane, external ear and ear canal normal. Tympanic membrane is not erythematous, not retracted and not bulging.  Nose: No mucosal edema or rhinorrhea. Right sinus exhibits no maxillary sinus tenderness and no frontal sinus tenderness. Left sinus exhibits no maxillary sinus tenderness and no frontal sinus tenderness.  Mouth/Throat: Uvula is midline, oropharynx is clear and moist and mucous membranes are normal.  Eyes: Conjunctivae, EOM and lids are normal. Pupils are equal, round, and reactive to light. Lids are everted and swept, no foreign bodies found.  Neck: Trachea normal and normal range of motion. Neck supple. Carotid bruit is not present. No mass and no thyromegaly present.  Cardiovascular: Normal rate, regular rhythm, S1 normal, S2 normal, normal heart sounds, intact distal pulses and normal pulses.  Exam reveals no gallop and no friction rub.   No murmur heard. Pulmonary/Chest: Effort normal and breath sounds normal. Not tachypneic. No respiratory distress. She has no decreased breath sounds. She has no wheezes. She has no rhonchi. She has no rales.  Abdominal: Soft. Normal appearance and bowel sounds are normal. There is no tenderness.  Neurological: She is alert.  Skin: Skin is warm, dry and intact. No rash noted.  Psychiatric: Her speech is normal and behavior is normal. Judgment and thought content normal. Her mood appears not anxious. Cognition and memory are normal. She does not exhibit a depressed mood.    Diabetic foot exam: Normal inspection No skin breakdown No calluses  Normal DP pulses Normal sensation to light touch and monofilament Nails normal   Left knee surgical scar well healing.       Assessment & Plan:

## 2014-05-11 NOTE — Patient Instructions (Signed)
Schedule medicare wellness in 09/2014 wth labs prior. Cancel other appts with me.  Keep up the good work, increase exercise as able.  Follow BP at home.. Call if consistently elevated.

## 2014-05-11 NOTE — Assessment & Plan Note (Signed)
Well controlled last check... Re-eval in 09/2014

## 2014-05-14 ENCOUNTER — Other Ambulatory Visit: Payer: Self-pay | Admitting: Family Medicine

## 2014-05-18 ENCOUNTER — Other Ambulatory Visit: Payer: Medicare Other

## 2014-05-22 ENCOUNTER — Other Ambulatory Visit: Payer: Self-pay | Admitting: Family Medicine

## 2014-05-25 ENCOUNTER — Ambulatory Visit: Payer: Medicare Other | Admitting: Family Medicine

## 2014-05-29 DIAGNOSIS — Z96659 Presence of unspecified artificial knee joint: Secondary | ICD-10-CM | POA: Diagnosis not present

## 2014-05-29 DIAGNOSIS — M25569 Pain in unspecified knee: Secondary | ICD-10-CM | POA: Diagnosis not present

## 2014-06-12 ENCOUNTER — Ambulatory Visit (INDEPENDENT_AMBULATORY_CARE_PROVIDER_SITE_OTHER): Payer: Medicare Other | Admitting: Family Medicine

## 2014-06-12 VITALS — BP 172/76

## 2014-06-12 DIAGNOSIS — I10 Essential (primary) hypertension: Secondary | ICD-10-CM

## 2014-06-12 DIAGNOSIS — I2510 Atherosclerotic heart disease of native coronary artery without angina pectoris: Secondary | ICD-10-CM

## 2014-06-12 NOTE — Progress Notes (Signed)
Please call pt. Her BP was up at office check today.  Has it remained consistently high at home as well? If so, I would reocommend  either adding an additional med to the coreg, losartan/HCTZ  or having her call her cardiologist to adjust/add meds.  Let me know.   BP Readings from Last 3 Encounters:  06/12/14 172/76  05/11/14 152/82  04/30/14 160/90

## 2014-06-13 ENCOUNTER — Telehealth: Payer: Self-pay | Admitting: Cardiology

## 2014-06-13 NOTE — Progress Notes (Signed)
Spoke with Tonya [name].  She states she has been keeping a check on her blood pressure readings at home and on average they have been running in the 140/70 range.  She states she was in to see her Cardiologist on 04/30/2014 and he told her if her blood pressures were consistently running high, to let him know.  She states she will call her Cardiologist to adjust/add medications.

## 2014-06-13 NOTE — Telephone Encounter (Signed)
Mrs. Tonya [name] is call ing because she was told to call back and let Dr.Hochrein know if her Blood Pressure was still high and it was 176-72 as of yesterday afternoon . Please call    Thanks

## 2014-06-13 NOTE — Progress Notes (Signed)
Agreed -

## 2014-06-13 NOTE — Telephone Encounter (Signed)
Returned call to patient. She went to Dr. Rometta Emery office yesterday for BP check. BP was 172/76 (in epic). She reports she was anxious and this BP reading is not her usual home readings, although she states they have been elevated.   BP 9/12 145/72 A week prior 138/68  Patient reports while in [facility] for rehab after left knee replacement, her BP fluctuated often.   Patient has no cardiac complaints during this call and felt fine yesterday when her BP was elevated, just felt anxious.   Will defer to Dr. Percival Spanish to advise if med changes/adjustments are necessary

## 2014-06-13 NOTE — Telephone Encounter (Signed)
No change in meds at this point although she should keep a BP diary and present this to me.

## 2014-06-14 ENCOUNTER — Other Ambulatory Visit: Payer: Self-pay | Admitting: Gastroenterology

## 2014-06-14 NOTE — Telephone Encounter (Signed)
Pt. Informed of Dr. Cherlyn Cushing

## 2014-06-18 ENCOUNTER — Encounter: Payer: Self-pay | Admitting: Cardiology

## 2014-06-18 ENCOUNTER — Ambulatory Visit (INDEPENDENT_AMBULATORY_CARE_PROVIDER_SITE_OTHER): Payer: Medicare Other | Admitting: Cardiology

## 2014-06-18 VITALS — BP 158/70 | HR 62 | Ht 66.0 in | Wt 169.0 lb

## 2014-06-18 DIAGNOSIS — I4891 Unspecified atrial fibrillation: Secondary | ICD-10-CM | POA: Diagnosis not present

## 2014-06-18 DIAGNOSIS — I10 Essential (primary) hypertension: Secondary | ICD-10-CM

## 2014-06-18 DIAGNOSIS — I480 Paroxysmal atrial fibrillation: Secondary | ICD-10-CM

## 2014-06-18 DIAGNOSIS — I2510 Atherosclerotic heart disease of native coronary artery without angina pectoris: Secondary | ICD-10-CM

## 2014-06-18 MED ORDER — LOSARTAN POTASSIUM-HCTZ 100-25 MG PO TABS: 1.0000 | ORAL_TABLET | Freq: Every day | ORAL | Status: DC

## 2014-06-18 NOTE — Progress Notes (Signed)
HPI The patient has  coronary disease as described below and she follows for her BP.  She has a followup appointment because her blood pressure was elevated at her primary care office. In addition she's been keeping a diary which are reviewed today. Her blood pressure systolics have typically been in the 160s.. She has not had any shortness of breath, PND or orthopnea above her previous baseline. She's not had any palpitations, presyncope or syncope.  She continues to have back pain.  Allergies  Allergen Reactions  . Amoxicillin-Pot Clavulanate Swelling    Augmentin per patient, tongue swelling    Current Outpatient Prescriptions  Medication Sig Dispense Refill  . acetaminophen (TYLENOL) 325 MG tablet Take 2 tablets (650 mg total) by mouth every 6 (six) hours as needed for mild pain (or Fever >/= 101).      Marland Kitchen aspirin 81 MG tablet Take 81 mg by mouth daily.      Marland Kitchen atorvastatin (LIPITOR) 20 MG tablet Take 1 tablet (20 mg total) by mouth daily.  90 tablet  0  . calcium-vitamin D (OSCAL WITH D 500-200) 500-200 MG-UNIT per tablet Take 1 tablet by mouth 2 (two) times daily.        . carvedilol (COREG) 12.5 MG tablet TAKE 1 TABLET BY MOUTH 2 TIMES DAILY WITH A MEAL.  180 tablet  1  . cetirizine (ZYRTEC) 10 MG tablet Take 10 mg by mouth daily as needed for allergies.      . cholestyramine light (PREVALITE) 4 G packet Take 4 g by mouth 2 (two) times daily.      . citalopram (CELEXA) 20 MG tablet Take 1 tablet (20 mg total) by mouth daily.  30 tablet  0  . diltiazem (CARDIZEM CD) 240 MG 24 hr capsule TAKE ONE CAPSULE BY MOUTH EVERY DAY  90 capsule  1  . ferrous sulfate 325 (65 FE) MG EC tablet Take 325 mg by mouth 2 (two) times daily.      Marland Kitchen glipizide-metformin (METAGLIP) 2.5-250 MG per tablet TAKE 2 TABLETS BY MOUTH TWICE A DAY BEFORE A MEAL  180 tablet  0  . KLOR-CON M20 20 MEQ tablet TAKE 1 TABLET BY MOUTH TWICE A DAY  180 tablet  1  . losartan-hydrochlorothiazide (HYZAAR) 100-12.5 MG per  tablet TAKE 1 TABLET BY MOUTH DAILY.  90 tablet  1  . Multiple Vitamin (MULTIVITAMIN WITH MINERALS) TABS Take 1 tablet by mouth daily.      . nabumetone (RELAFEN) 750 MG tablet Take 1 tablet by mouth as directed.      . nystatin cream (MYCOSTATIN) Apply 1 application topically 2 (two) times daily as needed for dry skin.      Marland Kitchen omeprazole (PRILOSEC) 20 MG capsule TAKE 2 CAPSULES BY MOUTH EVERY DAY  180 capsule  1  . PREVALITE 4 G packet TAKE 1 PACKET TWICE A DAY WITH A MEAL  180 packet  1   No current facility-administered medications for this visit.    Past Medical History  Diagnosis Date  . Osteoarthritis   . Edema, peripheral   . CAD (coronary artery disease)     s/p BMS to LAD  . Breast pain     left  . Hyperlipidemia   . Hypertension   . Sinusitis     acute- NOS  . Allergic rhinitis   . Adenocarcinoma, colon   . Sciatica   . Cystitis, acute   . Chronic diarrhea   . GERD (gastroesophageal reflux disease)   .  Depression   . Anxiety   . Osteoarthritis   . IBS (irritable bowel syndrome)   . Degenerative disk disease   . Carpal tunnel syndrome   . Diabetes mellitus, type 2   . History of pneumonia   . Nocturia   . Fall at home 03/16/2014    no fx; multiple facial lacerations, abrasions and hematoma  . Dysrhythmia     h/o AFIB  . S/P coronary artery stent placement 2007  . Anemia     iron deficiency    Past Surgical History  Procedure Laterality Date  . Knee arthroscopy      bilateral  . Cholecystectomy    . Cardiac catheterization  12/10    LAD stent patent. Insignificant CAD, otherwise EF 60-65%  . Partial colectomy  11/2008    right, for adenocarcinoma  . Replacement total knee  99991111    right, complicated by hemarthrosis   . Hematoma evacuation      after knee replacement  . Total knee revision  03/08/2012    Procedure: TOTAL KNEE REVISION;  Surgeon: Garald Balding, MD;  Location: [facility];  Service: Orthopedics;  Laterality: Right;  removal total knee  hardware and placement of antibiotic cement spacer and antibiotic beads  . Carpal tunnel release    . Total knee revision  05/17/2012    Procedure: TOTAL KNEE REVISION;  Surgeon: Garald Balding, MD;  Location: [facility];  Service: Orthopedics;  Laterality: Right;  right total knee revision, removal of antibiotic spacer  . Total knee arthroplasty Left 04/03/2014    dr whitfield  . Total knee arthroplasty Left 04/03/2014    Procedure: TOTAL KNEE ARTHROPLASTY;  Surgeon: Garald Balding, MD;  Location: [facility];  Service: Orthopedics;  Laterality: Left;    ROS:  Back pain.  Otherwise as stated in the HPI and negative for all other systems.  PHYSICAL EXAM BP 158/70  Pulse 62  Ht 5\' 6"  (1.676 m)  Wt 169 lb (76.658 kg)  BMI 27.29 kg/m2 GENERAL:  Well appearing NECK:  No jugular venous distention, waveform within normal limits, carotid upstroke brisk and symmetric, no bruits, no thyromegaly LUNGS:  Clear to auscultation bilaterally BACK:  No CVA tenderness CHEST:  Unremarkable HEART:  PMI not displaced or sustained,S1 and S2 within normal limits, no S3, no S4, no clicks, no rubs, no murmurs ABD:  Flat, positive bowel sounds normal in frequency in pitch, no bruits, no rebound, no guarding, no midline pulsatile mass, no hepatomegaly, no splenomegaly EXT:  2 plus pulses throughout, mild left leg edema unchanged, no cyanosis no clubbing  ASSESSMENT AND PLAN  CAD:  The patient has no new sypmtoms.  No further cardiovascular testing is indicated.  We will continue with aggressive risk reduction and meds as listed.  HTN:  I will increase her Cozaar HCT 100/25. Her potassium was fine previously she will continue current supplements. She will keep her blood pressure diary.  DYSLIPIDEMIA:  She is at target with her lipid level and I reviewed his labs from January.  (LDL 61 with HDL of 53).  She will remain on the meds as listed.  DM:  A1C was 5.9 in July

## 2014-06-18 NOTE — Patient Instructions (Addendum)
We have increased your losartan hct to 100/25 daily  Your physician recommends that you schedule a follow-up appointment in: six months with Dr. Percival Spanish

## 2014-06-22 ENCOUNTER — Other Ambulatory Visit: Payer: Self-pay | Admitting: Cardiology

## 2014-06-22 DIAGNOSIS — S0010XA Contusion of unspecified eyelid and periocular area, initial encounter: Secondary | ICD-10-CM | POA: Diagnosis not present

## 2014-06-22 DIAGNOSIS — H2589 Other age-related cataract: Secondary | ICD-10-CM | POA: Diagnosis not present

## 2014-06-22 DIAGNOSIS — IMO0002 Reserved for concepts with insufficient information to code with codable children: Secondary | ICD-10-CM | POA: Diagnosis not present

## 2014-06-26 DIAGNOSIS — Z23 Encounter for immunization: Secondary | ICD-10-CM | POA: Diagnosis not present

## 2014-07-03 ENCOUNTER — Other Ambulatory Visit: Payer: Self-pay | Admitting: Family Medicine

## 2014-07-04 DIAGNOSIS — Z96652 Presence of left artificial knee joint: Secondary | ICD-10-CM | POA: Diagnosis not present

## 2014-07-04 DIAGNOSIS — M179 Osteoarthritis of knee, unspecified: Secondary | ICD-10-CM | POA: Diagnosis not present

## 2014-07-13 ENCOUNTER — Other Ambulatory Visit: Payer: Self-pay | Admitting: Family Medicine

## 2014-07-13 ENCOUNTER — Other Ambulatory Visit: Payer: Self-pay

## 2014-07-13 NOTE — Telephone Encounter (Signed)
Last office visit 06/12/2014.

## 2014-07-14 ENCOUNTER — Other Ambulatory Visit: Payer: Self-pay | Admitting: Gastroenterology

## 2014-07-17 ENCOUNTER — Telehealth: Payer: Self-pay

## 2014-07-17 NOTE — Telephone Encounter (Signed)
Pt left v/m; pt missed call from [facility]; Dr Rometta Emery medical asst and front desk personnel had not tried to call pt. Pt was not sure who called her and will wait for cb.

## 2014-08-06 ENCOUNTER — Telehealth: Payer: Self-pay

## 2014-08-06 NOTE — Telephone Encounter (Signed)
Pt expecting a grandchild and wants to know if had tdap; advised td 2010 but did not have pertussis. Pt asked cost of med advised per Rose approx $110.00. Pt will contact health dept and call office back if needed.

## 2014-08-16 ENCOUNTER — Other Ambulatory Visit: Payer: Self-pay

## 2014-08-16 DIAGNOSIS — Z1231 Encounter for screening mammogram for malignant neoplasm of breast: Secondary | ICD-10-CM

## 2014-09-18 ENCOUNTER — Other Ambulatory Visit: Payer: Self-pay | Admitting: Gastroenterology

## 2014-09-19 ENCOUNTER — Other Ambulatory Visit: Payer: Self-pay | Admitting: Family Medicine

## 2014-10-21 ENCOUNTER — Telehealth: Payer: Self-pay | Admitting: Family Medicine

## 2014-10-21 DIAGNOSIS — E785 Hyperlipidemia, unspecified: Secondary | ICD-10-CM

## 2014-10-21 DIAGNOSIS — E119 Type 2 diabetes mellitus without complications: Secondary | ICD-10-CM

## 2014-10-21 NOTE — Telephone Encounter (Signed)
-----   Message from Ellamae Sia sent at 10/18/2014 12:56 PM EST ----- Regarding: Lab orders for Monday, 1.25.16 Patient is scheduled for CPX labs, please order future labs, Thanks , Karna Christmas

## 2014-10-22 ENCOUNTER — Other Ambulatory Visit (INDEPENDENT_AMBULATORY_CARE_PROVIDER_SITE_OTHER): Payer: Medicare Other

## 2014-10-22 DIAGNOSIS — E119 Type 2 diabetes mellitus without complications: Secondary | ICD-10-CM | POA: Diagnosis not present

## 2014-10-22 DIAGNOSIS — E785 Hyperlipidemia, unspecified: Secondary | ICD-10-CM | POA: Diagnosis not present

## 2014-10-22 LAB — COMPREHENSIVE METABOLIC PANEL
ALBUMIN: 4.4 g/dL (ref 3.5–5.2)
ALT: 28 U/L (ref 0–35)
AST: 30 U/L (ref 0–37)
Alkaline Phosphatase: 70 U/L (ref 39–117)
BUN: 17 mg/dL (ref 6–23)
CO2: 28 mEq/L (ref 19–32)
Calcium: 9.6 mg/dL (ref 8.4–10.5)
Chloride: 104 mEq/L (ref 96–112)
Creatinine, Ser: 1.13 mg/dL (ref 0.40–1.20)
GFR: 49.95 mL/min — AB (ref >60.00)
Glucose, Bld: 146 mg/dL — ABNORMAL HIGH (ref 70–99)
POTASSIUM: 4.2 meq/L (ref 3.5–5.1)
SODIUM: 141 meq/L (ref 135–145)
TOTAL PROTEIN: 7.1 g/dL (ref 6.0–8.3)
Total Bilirubin: 0.6 mg/dL (ref 0.2–1.2)

## 2014-10-22 LAB — LIPID PANEL
Cholesterol: 127 mg/dL (ref 0–200)
HDL: 42 mg/dL (ref >39.00)
NonHDL: 85
Total CHOL/HDL Ratio: 3
Triglycerides: 285 mg/dL — ABNORMAL HIGH (ref 0.0–149.0)
VLDL: 57 mg/dL — ABNORMAL HIGH (ref 0.0–40.0)

## 2014-10-22 LAB — HEMOGLOBIN A1C: Hgb A1c MFr Bld: 5.7 % (ref 4.6–6.5)

## 2014-10-22 LAB — LDL CHOLESTEROL, DIRECT: Direct LDL: 50 mg/dL

## 2014-10-24 ENCOUNTER — Other Ambulatory Visit: Payer: Self-pay | Admitting: Family Medicine

## 2014-10-24 ENCOUNTER — Ambulatory Visit
Admission: RE | Admit: 2014-10-24 | Discharge: 2014-10-24 | Disposition: A | Discharge status: Home / Self Care | Payer: Medicare Other | Source: Ambulatory Visit

## 2014-10-24 DIAGNOSIS — Z1231 Encounter for screening mammogram for malignant neoplasm of breast: Secondary | ICD-10-CM

## 2014-10-26 ENCOUNTER — Encounter: Payer: Self-pay | Admitting: Family Medicine

## 2014-10-26 ENCOUNTER — Ambulatory Visit (INDEPENDENT_AMBULATORY_CARE_PROVIDER_SITE_OTHER): Payer: Medicare Other | Admitting: Family Medicine

## 2014-10-26 VITALS — BP 170/80 | HR 61 | Temp 97.6°F | Ht 65.5 in | Wt 171.2 lb

## 2014-10-26 DIAGNOSIS — Z0000 Encounter for general adult medical examination without abnormal findings: Secondary | ICD-10-CM | POA: Diagnosis not present

## 2014-10-26 DIAGNOSIS — Z23 Encounter for immunization: Secondary | ICD-10-CM | POA: Diagnosis not present

## 2014-10-26 DIAGNOSIS — E785 Hyperlipidemia, unspecified: Secondary | ICD-10-CM

## 2014-10-26 DIAGNOSIS — E119 Type 2 diabetes mellitus without complications: Secondary | ICD-10-CM | POA: Diagnosis not present

## 2014-10-26 DIAGNOSIS — E2839 Other primary ovarian failure: Secondary | ICD-10-CM

## 2014-10-26 DIAGNOSIS — Z7189 Other specified counseling: Secondary | ICD-10-CM

## 2014-10-26 DIAGNOSIS — I10 Essential (primary) hypertension: Secondary | ICD-10-CM | POA: Diagnosis not present

## 2014-10-26 LAB — HM DIABETES FOOT EXAM

## 2014-10-26 NOTE — Progress Notes (Signed)
Pre visit review using our clinic review tool, if applicable. No additional management support is needed unless otherwise documented below in the visit note. 

## 2014-10-26 NOTE — Assessment & Plan Note (Signed)
Elevated today, she will follow at home, pt on 4 meds, she will let her cardiologist know for further recommendations on BP addition/change.

## 2014-10-26 NOTE — Progress Notes (Signed)
I have personally reviewed the Medicare Annual Wellness questionnaire and have noted  1. The patient's medical and social history  2. Their use of alcohol, tobacco or illicit drugs  3. Their current medications and supplements  4. The patient's functional ability including ADL's, fall risks, home safety risks and hearing or visual  impairment.  5. Diet and physical activities  6. Evidence for depression or mood disorders  The patients weight, height, BMI and visual acuity have been recorded in the chart  I have made referrals, counseling and provided education to the patient based review of the above and I have provided the pt with a written personalized care plan for preventive services.   Hypertension: Inadequate control on coreg, diltiazem, losartan/HCTZ. Has appt in 11/2014 with cards. BP Readings from Last 3 Encounters:  10/26/14 170/80  06/18/14 158/70  06/12/14 172/76  Using medication without problems or lightheadedness: Occ off and on for longtime. Occ falls.  Chest pain with exertion: None  Edema Intermittent when on feet for long time.  Short of breath: Mild  Average home BPs: 145-150/60-75  Other issues: CAD, afib: stable per cards, last OV in 04/2014  Diabetes: On metaglip, Excellent  control since last check! Lab Results  Component Value Date   HGBA1C 5.7 10/22/2014  Hypoglycemic episodes:None  Hyperglycemic episodes:None  Feet problems:blister between toes where shoes rubbing, no ulcer Blood Sugars averaging:   FBS 87-120 Eye exam within last year:  12/2013  Elevated Cholesterol:well controlled on atorvastatin 20 LDL at goal < 70!!! Lab Results  Component Value Date   CHOL 127 10/22/2014   HDL 42.00 10/22/2014   LDLCALC 53 10/13/2013   LDLDIRECT 50.0 10/22/2014   TRIG 285.0* 10/22/2014   CHOLHDL 3 10/22/2014  Muscle aches: None  Other complaints:  Moderate diet, occ walking for exercise but not macuh.   Review of Systems   Constitutional: Negative for fever and fatigue.  HENT: Negative for ear pain.  Eyes: Negative for pain.  Respiratory: Negative for chest tightness and shortness of breath.  Cardiovascular: Negative for chest pain, palpitations and leg swelling.  Gastrointestinal: Negative for abdominal pain.  Genitourinary: Negative for dysuria. Urinary frequency and urgency, occ incontinence, 1-2 UOP at night, every 2-3 hours during the day.  Objective:   Physical Exam  Constitutional: Vital signs are normal. She appears well-developed and well-nourished. She is cooperative. Non-toxic appearance. She does not appear ill. No distress.  HENT:  Head: Normocephalic.  Right Ear: Hearing, tympanic membrane, external ear and ear canal normal.  Left Ear: Hearing, tympanic membrane, external ear and ear canal normal.  Nose: Nose normal.  Eyes: Conjunctivae, EOM and lids are normal. Pupils are equal, round, and reactive to light. No foreign bodies found.  Neck: Trachea normal and normal range of motion. Neck supple. Carotid bruit is not present. No mass and no thyromegaly present.  Cardiovascular: Normal rate, regular rhythm, S1 normal, S2 normal, normal heart sounds and intact distal pulses. Exam reveals no gallop.  No murmur heard.  Pulmonary/Chest: Effort normal and breath sounds normal. No respiratory distress. She has no wheezes. She has no rhonchi. She has no rales.  Abdominal: Soft. Normal appearance and bowel sounds are normal. She exhibits no distension, no fluid wave, no abdominal bruit and no mass. There is no hepatosplenomegaly. There is no tenderness. There is no rebound, no guarding and no CVA tenderness. No hernia.  Genitourinary: No breast swelling, tenderness, discharge or bleeding. Pelvic exam was performed with patient prone.  Lymphadenopathy:  She has no cervical adenopathy.  She has no axillary adenopathy.  Neurological: She is alert. She has normal strength. No cranial nerve  deficit or sensory deficit.  Skin: Skin is warm, dry and intact. No rash noted.  Psychiatric: Her speech is normal and behavior is normal. Judgment normal. Her mood appears not anxious. Cognition and memory are normal. She does not exhibit a depressed mood.   Diabetic foot exam:  blisters between toes, no sign of infection, keep shoe change. Normal inspection  No skin breakdown  No calluses  Normal DP pulses  Normal sensation to light tough and monofilament  Nails normal  Assessment & Plan:   Annual Medicare Wellness: The patient's preventative maintenance and recommended screening tests for an annual wellness exam were reviewed in full today.  Brought up to date unless services declined.  Counselled on the importance of diet, exercise, and its role in overall health and mortality.  The patient's FH and SH was reviewed, including their home life, tobacco status, and drug and alcohol status.   DEXA due, last nml 2009.  Mammogram nml in 10/24/2014 Uptodate with colonoscopy with hx of colon cancer.. Last 11/2012, repeat in 5 years. PAP/DVE not indicated at this age. No vaginal bleeding.  Nonsmoker  Vaccine: uptodate, given prevnar today

## 2014-10-26 NOTE — Patient Instructions (Addendum)
Call cardiology about BP being elevated.. Likely want to add a medicine sooner. Keep appt with cards in 11/2014.  Consider water exercise.Stop at front desk to set up bone density referral.

## 2014-10-26 NOTE — Assessment & Plan Note (Signed)
Well controlled. Continue current medication.  

## 2014-10-27 ENCOUNTER — Encounter: Payer: Self-pay | Admitting: Family Medicine

## 2014-10-29 ENCOUNTER — Telehealth: Payer: Self-pay | Admitting: Family Medicine

## 2014-10-29 NOTE — Telephone Encounter (Signed)
emmi emailed °

## 2014-11-01 ENCOUNTER — Ambulatory Visit (INDEPENDENT_AMBULATORY_CARE_PROVIDER_SITE_OTHER)
Admission: RE | Admit: 2014-11-01 | Discharge: 2014-11-01 | Disposition: A | Discharge status: Home / Self Care | Payer: Medicare Other | Source: Ambulatory Visit | Attending: Family Medicine | Admitting: Family Medicine

## 2014-11-01 DIAGNOSIS — E2839 Other primary ovarian failure: Secondary | ICD-10-CM

## 2014-11-18 ENCOUNTER — Other Ambulatory Visit: Payer: Self-pay | Admitting: Gastroenterology

## 2014-11-18 ENCOUNTER — Other Ambulatory Visit: Payer: Self-pay | Admitting: Family Medicine

## 2014-11-21 ENCOUNTER — Other Ambulatory Visit: Payer: Self-pay | Admitting: Family Medicine

## 2014-12-10 ENCOUNTER — Ambulatory Visit (INDEPENDENT_AMBULATORY_CARE_PROVIDER_SITE_OTHER): Payer: Medicare Other | Admitting: Cardiology

## 2014-12-10 ENCOUNTER — Encounter: Payer: Self-pay | Admitting: Cardiology

## 2014-12-10 VITALS — BP 160/80 | Ht 66.0 in | Wt 173.0 lb

## 2014-12-10 DIAGNOSIS — E878 Other disorders of electrolyte and fluid balance, not elsewhere classified: Secondary | ICD-10-CM

## 2014-12-10 MED ORDER — SPIRONOLACTONE 25 MG PO TABS: 25.0000 mg | ORAL_TABLET | Freq: Every day | ORAL | Status: DC

## 2014-12-10 NOTE — Progress Notes (Signed)
HPI The patient has  coronary disease as described below and she follows for her BP.  At the last visit I increased her losartan HCT. She was to keep a blood pressure diary but she didn't have access to the cuff. However, to readings at her primary care office were elevated since I have seen her.  She says she's been doing well. She denies any cardiovascular symptoms. She walks up and down her stairs frequently. The patient denies any new symptoms such as chest discomfort, neck or arm discomfort. There has been no new shortness of breath, PND or orthopnea. There have been no reported palpitations, presyncope or syncope.  Allergies  Allergen Reactions  . Amoxicillin-Pot Clavulanate Swelling    Augmentin per patient, tongue swelling    Current Outpatient Prescriptions  Medication Sig Dispense Refill  . acetaminophen (TYLENOL) 325 MG tablet Take 2 tablets (650 mg total) by mouth every 6 (six) hours as needed for mild pain (or Fever >/= 101).    Marland Kitchen aspirin 81 MG tablet Take 81 mg by mouth daily.    Marland Kitchen atorvastatin (LIPITOR) 20 MG tablet TAKE 1 TABLET BY MOUTH DAILY. 90 tablet 1  . calcium-vitamin D (OSCAL WITH D 500-200) 500-200 MG-UNIT per tablet Take 1 tablet by mouth 2 (two) times daily.      . carvedilol (COREG) 12.5 MG tablet TAKE 1 TABLET BY MOUTH 2 TIMES DAILY WITH A MEAL. 180 tablet 1  . cetirizine (ZYRTEC) 10 MG tablet Take 10 mg by mouth daily as needed for allergies.    . citalopram (CELEXA) 40 MG tablet TAKE 1 TABLET BY MOUTH EVERY DAY 90 tablet 1  . diltiazem (CARDIZEM CD) 240 MG 24 hr capsule TAKE ONE CAPSULE BY MOUTH EVERY DAY 90 capsule 1  . ferrous sulfate 325 (65 FE) MG EC tablet Take 325 mg by mouth 2 (two) times daily.    Marland Kitchen glipizide-metformin (METAGLIP) 2.5-250 MG per tablet TAKE 2 TABLETS BY MOUTH TWICE A DAY BEFORE A MEAL 120 tablet 5  . KLOR-CON M20 20 MEQ tablet TAKE 1 TABLET BY MOUTH TWICE A DAY 180 tablet 1  . losartan-hydrochlorothiazide (HYZAAR) 100-25 MG per tablet  Take 1 tablet by mouth daily. 90 tablet 3  . Multiple Vitamin (MULTIVITAMIN WITH MINERALS) TABS Take 1 tablet by mouth daily.    . nabumetone (RELAFEN) 750 MG tablet TAKE 1 TABLET BY MOUTH EVERY DAY ON ODD DAYS AND 2 TABLETS ON EVEN DAYS 60 tablet 2  . nystatin cream (MYCOSTATIN) Apply 1 application topically 2 (two) times daily as needed for dry skin.    Marland Kitchen omeprazole (PRILOSEC) 20 MG capsule TAKE 2 CAPSULES BY MOUTH EVERY DAY 180 capsule 1  . PREVALITE 4 G packet TAKE 1 PACKET TWICE A DAY WITH A MEAL 180 packet 0   No current facility-administered medications for this visit.    Past Medical History  Diagnosis Date  . Osteoarthritis   . Edema, peripheral   . CAD (coronary artery disease)     s/p BMS to LAD  . Breast pain     left  . Hyperlipidemia   . Hypertension   . Sinusitis     acute- NOS  . Allergic rhinitis   . Adenocarcinoma, colon   . Sciatica   . Cystitis, acute   . Chronic diarrhea   . GERD (gastroesophageal reflux disease)   . Depression   . Anxiety   . Osteoarthritis   . IBS (irritable bowel syndrome)   . Degenerative  disk disease   . Carpal tunnel syndrome   . Diabetes mellitus, type 2   . History of pneumonia   . Nocturia   . Fall at home 03/16/2014    no fx; multiple facial lacerations, abrasions and hematoma  . Dysrhythmia     h/o AFIB  . S/P coronary artery stent placement 2007  . Anemia     iron deficiency    Past Surgical History  Procedure Laterality Date  . Knee arthroscopy      bilateral  . Cholecystectomy    . Cardiac catheterization  12/10    LAD stent patent. Insignificant CAD, otherwise EF 60-65%  . Partial colectomy  11/2008    right, for adenocarcinoma  . Replacement total knee  [id number]    right, complicated by hemarthrosis   . Hematoma evacuation      after knee replacement  . Total knee revision  03/08/2012    Procedure: TOTAL KNEE REVISION;  Surgeon: Garald Balding, MD;  Location: [facility];  Service: Orthopedics;  Laterality:  Right;  removal total knee hardware and placement of antibiotic cement spacer and antibiotic beads  . Carpal tunnel release    . Total knee revision  05/17/2012    Procedure: TOTAL KNEE REVISION;  Surgeon: Garald Balding, MD;  Location: [facility];  Service: Orthopedics;  Laterality: Right;  right total knee revision, removal of antibiotic spacer  . Total knee arthroplasty Left 04/03/2014    dr whitfield  . Total knee arthroplasty Left 04/03/2014    Procedure: TOTAL KNEE ARTHROPLASTY;  Surgeon: Garald Balding, MD;  Location: [facility];  Service: Orthopedics;  Laterality: Left;    ROS:  Back pain.  Otherwise as stated in the HPI and negative for all other systems.  PHYSICAL EXAM BP 160/80 mmHg  Ht 5\' 6"  (1.676 m)  Wt 173 lb (78.472 kg)  BMI 27.94 kg/m2 GENERAL:  Well appearing NECK:  No jugular venous distention, waveform within normal limits, carotid upstroke brisk and symmetric, no bruits, no thyromegaly LUNGS:  Clear to auscultation bilaterally BACK:  No CVA tenderness CHEST:  Unremarkable HEART:  PMI not displaced or sustained,S1 and S2 within normal limits, no S3, no S4, no clicks, no rubs, no murmurs ABD:  Flat, positive bowel sounds normal in frequency in pitch, no bruits, no rebound, no guarding, no midline pulsatile mass, no hepatomegaly, no splenomegaly EXT:  2 plus pulses throughout, mild left leg edema unchanged, no cyanosis no clubbing  EKG:  Sinus rhythm, rate 58, axis within normal limits, intervals within normal limits, no acute ST-T wave changes. 12/10/2014   ASSESSMENT AND PLAN  CAD:  The patient has no new sypmtoms.  No further cardiovascular testing is indicated.  We will continue with aggressive risk reduction and meds as listed.  HTN:  Today I will add spironolactone 25 mg daily to her regimen. She's going to stop her potassium supplements and increase the potassium in her diet. We will get a basic metabolic profile in 2 weeks. I will get another one 4 weeks after that.  She will keep her blood pressure diary.  DYSLIPIDEMIA:  She is at target with her lipid level and I reviewed his labs from January.  (LDL 50 with HDL of 42).  She will remain on the meds as listed.  DM:  A1C was 5.7 in Jan

## 2014-12-10 NOTE — Patient Instructions (Signed)
Your physician recommends that you schedule a follow-up appointment in: 6 months with Dr. Percival Spanish  Have your lab drawn in two weeks and again in 4 weeks  Stop taking your potassium  Start taking the spironolactone 25 mg daily

## 2014-12-15 ENCOUNTER — Other Ambulatory Visit: Payer: Self-pay | Admitting: Family Medicine

## 2014-12-16 ENCOUNTER — Other Ambulatory Visit: Payer: Self-pay | Admitting: Family Medicine

## 2014-12-16 ENCOUNTER — Other Ambulatory Visit: Payer: Self-pay | Admitting: Gastroenterology

## 2014-12-16 ENCOUNTER — Encounter: Payer: Self-pay | Admitting: Family Medicine

## 2014-12-17 MED ORDER — GLIPIZIDE-METFORMIN HCL 2.5-250 MG PO TABS: ORAL_TABLET | ORAL | Status: DC

## 2014-12-24 ENCOUNTER — Other Ambulatory Visit: Payer: Self-pay | Admitting: Family Medicine

## 2014-12-24 DIAGNOSIS — I10 Essential (primary) hypertension: Secondary | ICD-10-CM

## 2014-12-26 ENCOUNTER — Other Ambulatory Visit (INDEPENDENT_AMBULATORY_CARE_PROVIDER_SITE_OTHER): Payer: Medicare Other

## 2014-12-26 DIAGNOSIS — I10 Essential (primary) hypertension: Secondary | ICD-10-CM | POA: Diagnosis not present

## 2014-12-26 DIAGNOSIS — E878 Other disorders of electrolyte and fluid balance, not elsewhere classified: Secondary | ICD-10-CM | POA: Diagnosis not present

## 2014-12-26 NOTE — Addendum Note (Signed)
Addended by: Ellamae Sia on: 12/26/2014 01:00 PM   Modules accepted: Orders

## 2014-12-27 LAB — BASIC METABOLIC PANEL
BUN: 24 mg/dL — ABNORMAL HIGH (ref 6–23)
CALCIUM: 9.1 mg/dL (ref 8.4–10.5)
CO2: 22 meq/L (ref 19–32)
Chloride: 109 mEq/L (ref 96–112)
Creat: 1.3 mg/dL — ABNORMAL HIGH (ref 0.50–1.10)
Glucose, Bld: 114 mg/dL — ABNORMAL HIGH (ref 70–99)
Potassium: 4.7 mEq/L (ref 3.5–5.3)
Sodium: 141 mEq/L (ref 135–145)

## 2014-12-28 ENCOUNTER — Other Ambulatory Visit: Payer: Self-pay | Admitting: *Deleted

## 2014-12-28 DIAGNOSIS — I10 Essential (primary) hypertension: Secondary | ICD-10-CM

## 2015-01-02 ENCOUNTER — Other Ambulatory Visit (INDEPENDENT_AMBULATORY_CARE_PROVIDER_SITE_OTHER): Payer: Medicare Other

## 2015-01-02 DIAGNOSIS — R899 Unspecified abnormal finding in specimens from other organs, systems and tissues: Secondary | ICD-10-CM

## 2015-01-02 LAB — BASIC METABOLIC PANEL
BUN: 25 mg/dL — AB (ref 6–23)
CHLORIDE: 109 meq/L (ref 96–112)
CO2: 21 mEq/L (ref 19–32)
Calcium: 9.4 mg/dL (ref 8.4–10.5)
Creatinine, Ser: 1.44 mg/dL — ABNORMAL HIGH (ref 0.40–1.20)
GFR: 37.74 mL/min — ABNORMAL LOW (ref >60.00)
GLUCOSE: 106 mg/dL — AB (ref 70–99)
Potassium: 4.5 mEq/L (ref 3.5–5.1)
Sodium: 138 mEq/L (ref 135–145)

## 2015-01-03 ENCOUNTER — Encounter: Payer: Self-pay | Admitting: Cardiology

## 2015-01-04 ENCOUNTER — Other Ambulatory Visit: Payer: Self-pay | Admitting: Cardiology

## 2015-01-04 DIAGNOSIS — I10 Essential (primary) hypertension: Secondary | ICD-10-CM

## 2015-01-07 ENCOUNTER — Telehealth: Payer: Self-pay | Admitting: *Deleted

## 2015-01-07 DIAGNOSIS — Z79899 Other long term (current) drug therapy: Secondary | ICD-10-CM

## 2015-01-07 NOTE — Telephone Encounter (Signed)
-----   Message from Minus Breeding, MD sent at 01/04/2015  8:36 AM EDT ----- Please order a BMET for next week.  Make sure it comes to me.  The last one went to her PCP because it was done at their office.  Thanks.

## 2015-01-07 NOTE — Telephone Encounter (Signed)
BMET ordered. Spoke with patient who states that she will be going Friday to get it drawn

## 2015-01-11 ENCOUNTER — Other Ambulatory Visit: Payer: Medicare Other

## 2015-01-11 DIAGNOSIS — I10 Essential (primary) hypertension: Secondary | ICD-10-CM | POA: Diagnosis not present

## 2015-01-12 LAB — BASIC METABOLIC PANEL WITHOUT GFR
BUN: 31 mg/dL — ABNORMAL HIGH (ref 6–23)
CO2: 22 meq/L (ref 19–32)
Calcium: 9.4 mg/dL (ref 8.4–10.5)
Chloride: 107 meq/L (ref 96–112)
Creat: 1.42 mg/dL — ABNORMAL HIGH (ref 0.50–1.10)
GFR, Est African American: 42 mL/min — ABNORMAL LOW
GFR, Est Non African American: 36 mL/min — ABNORMAL LOW
Glucose, Bld: 127 mg/dL — ABNORMAL HIGH (ref 70–99)
Potassium: 5.1 meq/L (ref 3.5–5.3)
Sodium: 139 meq/L (ref 135–145)

## 2015-01-15 ENCOUNTER — Other Ambulatory Visit: Payer: Self-pay | Admitting: Gastroenterology

## 2015-01-16 ENCOUNTER — Encounter: Payer: Self-pay | Admitting: Cardiology

## 2015-01-16 DIAGNOSIS — M545 Low back pain: Secondary | ICD-10-CM | POA: Diagnosis not present

## 2015-01-21 ENCOUNTER — Other Ambulatory Visit: Payer: Self-pay | Admitting: Orthopaedic Surgery

## 2015-01-21 DIAGNOSIS — M545 Low back pain: Secondary | ICD-10-CM

## 2015-01-23 ENCOUNTER — Other Ambulatory Visit: Payer: BLUE CROSS/BLUE SHIELD

## 2015-01-23 DIAGNOSIS — H25813 Combined forms of age-related cataract, bilateral: Secondary | ICD-10-CM | POA: Diagnosis not present

## 2015-01-23 DIAGNOSIS — H25812 Combined forms of age-related cataract, left eye: Secondary | ICD-10-CM | POA: Diagnosis not present

## 2015-01-23 DIAGNOSIS — H43813 Vitreous degeneration, bilateral: Secondary | ICD-10-CM | POA: Diagnosis not present

## 2015-01-23 DIAGNOSIS — E119 Type 2 diabetes mellitus without complications: Secondary | ICD-10-CM | POA: Diagnosis not present

## 2015-01-23 LAB — HM DIABETES EYE EXAM

## 2015-01-25 ENCOUNTER — Ambulatory Visit
Admission: RE | Admit: 2015-01-25 | Discharge: 2015-01-25 | Disposition: A | Discharge status: Home / Self Care | Payer: Medicare Other | Source: Ambulatory Visit | Attending: Orthopaedic Surgery | Admitting: Orthopaedic Surgery

## 2015-01-25 DIAGNOSIS — M9973 Connective tissue and disc stenosis of intervertebral foramina of lumbar region: Secondary | ICD-10-CM | POA: Diagnosis not present

## 2015-01-25 DIAGNOSIS — M4806 Spinal stenosis, lumbar region: Secondary | ICD-10-CM | POA: Diagnosis not present

## 2015-01-25 DIAGNOSIS — M545 Low back pain: Secondary | ICD-10-CM

## 2015-01-27 ENCOUNTER — Other Ambulatory Visit: Payer: Self-pay | Admitting: Cardiology

## 2015-01-30 ENCOUNTER — Encounter: Payer: Self-pay | Admitting: Family Medicine

## 2015-01-30 DIAGNOSIS — M47816 Spondylosis without myelopathy or radiculopathy, lumbar region: Secondary | ICD-10-CM | POA: Diagnosis not present

## 2015-01-30 DIAGNOSIS — M5136 Other intervertebral disc degeneration, lumbar region: Secondary | ICD-10-CM | POA: Diagnosis not present

## 2015-01-30 DIAGNOSIS — M4806 Spinal stenosis, lumbar region: Secondary | ICD-10-CM | POA: Diagnosis not present

## 2015-01-31 ENCOUNTER — Telehealth: Payer: Self-pay | Admitting: Family Medicine

## 2015-01-31 NOTE — Telephone Encounter (Signed)
Mychart

## 2015-02-04 MED ORDER — SITAGLIPTIN PHOSPHATE 100 MG PO TABS: 100.0000 mg | ORAL_TABLET | Freq: Every day | ORAL | Status: DC

## 2015-02-04 NOTE — Telephone Encounter (Signed)
Tonya [name] notified as instructed by telephone.

## 2015-02-04 NOTE — Telephone Encounter (Signed)
Let pt know I have sent in januvia 100 mg daily for her to start when CBGs increase after steroifd injection.

## 2015-02-04 NOTE — Addendum Note (Signed)
Addended byEliezer Lofts E on: 02/04/2015 12:09 AM   Modules accepted: Orders

## 2015-02-08 ENCOUNTER — Encounter: Payer: Self-pay | Admitting: Family Medicine

## 2015-02-12 DIAGNOSIS — M47816 Spondylosis without myelopathy or radiculopathy, lumbar region: Secondary | ICD-10-CM | POA: Diagnosis not present

## 2015-02-12 DIAGNOSIS — M5417 Radiculopathy, lumbosacral region: Secondary | ICD-10-CM | POA: Diagnosis not present

## 2015-02-12 DIAGNOSIS — M4806 Spinal stenosis, lumbar region: Secondary | ICD-10-CM | POA: Diagnosis not present

## 2015-02-18 DIAGNOSIS — H2512 Age-related nuclear cataract, left eye: Secondary | ICD-10-CM | POA: Diagnosis not present

## 2015-02-18 DIAGNOSIS — H268 Other specified cataract: Secondary | ICD-10-CM | POA: Diagnosis not present

## 2015-03-05 DIAGNOSIS — M47816 Spondylosis without myelopathy or radiculopathy, lumbar region: Secondary | ICD-10-CM | POA: Diagnosis not present

## 2015-03-05 DIAGNOSIS — M545 Low back pain: Secondary | ICD-10-CM | POA: Diagnosis not present

## 2015-03-13 DIAGNOSIS — H2511 Age-related nuclear cataract, right eye: Secondary | ICD-10-CM | POA: Diagnosis not present

## 2015-03-18 DIAGNOSIS — H25811 Combined forms of age-related cataract, right eye: Secondary | ICD-10-CM | POA: Diagnosis not present

## 2015-03-18 DIAGNOSIS — H2511 Age-related nuclear cataract, right eye: Secondary | ICD-10-CM | POA: Diagnosis not present

## 2015-03-29 HISTORY — PX: CATARACT EXTRACTION: SUR2

## 2015-04-04 DIAGNOSIS — M4806 Spinal stenosis, lumbar region: Secondary | ICD-10-CM | POA: Diagnosis not present

## 2015-04-04 DIAGNOSIS — M5416 Radiculopathy, lumbar region: Secondary | ICD-10-CM | POA: Diagnosis not present

## 2015-04-09 DIAGNOSIS — M5416 Radiculopathy, lumbar region: Secondary | ICD-10-CM | POA: Diagnosis not present

## 2015-04-09 DIAGNOSIS — M4806 Spinal stenosis, lumbar region: Secondary | ICD-10-CM | POA: Diagnosis not present

## 2015-04-18 ENCOUNTER — Telehealth: Payer: Self-pay | Admitting: Family Medicine

## 2015-04-18 DIAGNOSIS — E119 Type 2 diabetes mellitus without complications: Secondary | ICD-10-CM

## 2015-04-18 NOTE — Telephone Encounter (Signed)
-----   Message from Marchia Bond sent at 04/11/2015  4:11 PM EDT ----- Regarding: 6 mo dm f/o labs Mon 7/25, need orders please :-) Please order  future 6 mo dm f/u labs for pt's upcoming lab appt. Thanks Aniceto Boss

## 2015-04-20 ENCOUNTER — Other Ambulatory Visit: Payer: Self-pay | Admitting: Gastroenterology

## 2015-04-22 ENCOUNTER — Other Ambulatory Visit (INDEPENDENT_AMBULATORY_CARE_PROVIDER_SITE_OTHER): Payer: Medicare Other

## 2015-04-22 ENCOUNTER — Telehealth: Payer: Self-pay | Admitting: Gastroenterology

## 2015-04-22 DIAGNOSIS — E119 Type 2 diabetes mellitus without complications: Secondary | ICD-10-CM | POA: Diagnosis not present

## 2015-04-22 LAB — LIPID PANEL
Cholesterol: 105 mg/dL (ref 0–200)
HDL: 44.7 mg/dL (ref >39.00)
LDL Cholesterol: 33 mg/dL (ref 0–99)
NonHDL: 60.3
TRIGLYCERIDES: 136 mg/dL (ref 0.0–149.0)
Total CHOL/HDL Ratio: 2
VLDL: 27.2 mg/dL (ref 0.0–40.0)

## 2015-04-22 LAB — COMPREHENSIVE METABOLIC PANEL
ALBUMIN: 4.2 g/dL (ref 3.5–5.2)
ALK PHOS: 51 U/L (ref 39–117)
ALT: 25 U/L (ref 0–35)
AST: 27 U/L (ref 0–37)
BUN: 23 mg/dL (ref 6–23)
CHLORIDE: 107 meq/L (ref 96–112)
CO2: 24 mEq/L (ref 19–32)
Calcium: 9.6 mg/dL (ref 8.4–10.5)
Creatinine, Ser: 1.33 mg/dL — ABNORMAL HIGH (ref 0.40–1.20)
GFR: 41.33 mL/min — ABNORMAL LOW (ref >60.00)
GLUCOSE: 72 mg/dL (ref 70–99)
POTASSIUM: 5.7 meq/L — AB (ref 3.5–5.1)
Sodium: 136 mEq/L (ref 135–145)
Total Bilirubin: 0.5 mg/dL (ref 0.2–1.2)
Total Protein: 6.6 g/dL (ref 6.0–8.3)

## 2015-04-22 LAB — HEMOGLOBIN A1C: HEMOGLOBIN A1C: 5.5 % (ref 4.6–6.5)

## 2015-04-22 MED ORDER — CHOLESTYRAMINE LIGHT 4 G PO PACK: PACK | ORAL | Status: DC

## 2015-04-22 NOTE — Telephone Encounter (Signed)
1 refill has been sent to the pharmacy, pt aware she needs to keep appt

## 2015-04-23 ENCOUNTER — Other Ambulatory Visit: Payer: Self-pay | Admitting: Family Medicine

## 2015-04-23 MED ORDER — SODIUM POLYSTYRENE SULFONATE 15 GM/60ML PO SUSP: 15.0000 g | Freq: Once | ORAL | Status: DC

## 2015-04-25 ENCOUNTER — Ambulatory Visit (INDEPENDENT_AMBULATORY_CARE_PROVIDER_SITE_OTHER): Payer: Medicare Other | Admitting: Family Medicine

## 2015-04-25 ENCOUNTER — Encounter: Payer: Self-pay | Admitting: Family Medicine

## 2015-04-25 VITALS — BP 134/60 | HR 65 | Temp 98.8°F | Ht 65.5 in | Wt 161.5 lb

## 2015-04-25 DIAGNOSIS — E875 Hyperkalemia: Secondary | ICD-10-CM

## 2015-04-25 DIAGNOSIS — K589 Irritable bowel syndrome without diarrhea: Secondary | ICD-10-CM | POA: Diagnosis not present

## 2015-04-25 DIAGNOSIS — I10 Essential (primary) hypertension: Secondary | ICD-10-CM | POA: Diagnosis not present

## 2015-04-25 DIAGNOSIS — L304 Erythema intertrigo: Secondary | ICD-10-CM

## 2015-04-25 DIAGNOSIS — E785 Hyperlipidemia, unspecified: Secondary | ICD-10-CM

## 2015-04-25 DIAGNOSIS — E119 Type 2 diabetes mellitus without complications: Secondary | ICD-10-CM

## 2015-04-25 LAB — BASIC METABOLIC PANEL
BUN: 32 mg/dL — ABNORMAL HIGH (ref 6–23)
CHLORIDE: 105 meq/L (ref 96–112)
CO2: 23 mEq/L (ref 19–32)
CREATININE: 1.47 mg/dL — AB (ref 0.40–1.20)
Calcium: 9.5 mg/dL (ref 8.4–10.5)
GFR: 36.82 mL/min — ABNORMAL LOW (ref >60.00)
Glucose, Bld: 89 mg/dL (ref 70–99)
POTASSIUM: 5.5 meq/L — AB (ref 3.5–5.1)
Sodium: 134 mEq/L — ABNORMAL LOW (ref 135–145)

## 2015-04-25 LAB — HM DIABETES FOOT EXAM

## 2015-04-25 MED ORDER — NYSTATIN 100000 UNIT/GM EX CREA: 1.0000 "application " | TOPICAL_CREAM | Freq: Two times a day (BID) | CUTANEOUS | Status: DC | PRN

## 2015-04-25 NOTE — Assessment & Plan Note (Signed)
Likely due to high potassium diet and addition of spironolactone to ACEI despite use of HCTZ.  Pt was nervous about SE to  kayexalate so did not take. She was also hesitant about holding spironolactone given recent improvement in BP so she did not hold it. Will check potassium again today after several days of fluids and low potassium diet.

## 2015-04-25 NOTE — Patient Instructions (Addendum)
Stop at lab on way out for potassium check.   Hyperkalemia Hyperkalemia is when you have too much potassium in your blood. This can be a life-threatening condition. Potassium is normally removed (excreted) from the body by the kidneys. CAUSES  The potassium level in your body can become too high for the following reasons:  You take in too much potassium. You can do this by:  Using salt substitutes. They contain large amounts of potassium.  Taking potassium supplements from your caregiver. The dose may be too high for you.  Eating foods or taking nutritional products with potassium.  You excrete too little potassium. This can happen if:  Your kidneys are not functioning properly. Kidney (renal) disease is a very common cause of hyperkalemia.  You are taking medicines that lower your excretion of potassium, such as certain diuretic medicines.  You have an adrenal gland disease called Addison's disease.  You have a urinary tract obstruction, such as kidney stones.  You are on treatment to mechanically clean your blood (dialysis) and you skip a treatment.  You release a high amount of potassium from your cells into your blood. You may have a condition that causes potassium to move from your cells to your bloodstream. This can happen with:  Injury to muscles or other tissues. Most potassium is stored in the muscles.  Severe burns or infections.  Acidic blood plasma (acidosis). Acidosis can result from many diseases, such as uncontrolled diabetes. SYMPTOMS  Usually, there are no symptoms unless the potassium is dangerously high or has risen very quickly. Symptoms may include:  Irregular or very slow heartbeat.  Feeling sick to your stomach (nauseous).  Tiredness (fatigue).  Nerve problems such as tingling of the skin, numbness of the hands or feet, weakness, or paralysis. DIAGNOSIS  A simple blood test can measure the amount of potassium in your body. An electrocardiogram test  of the heart can also help make the diagnosis. The heart may beat dangerously fast or slow down and stop beating with severe hyperkalemia.  TREATMENT  Treatment depends on how bad the condition is and on the underlying cause.  If the hyperkalemia is an emergency (causing heart problems or paralysis), many different medicines can be used alone or together to lower the potassium level briefly. This may include an insulin injection even if you are not diabetic. Emergency dialysis may be needed to remove potassium from the body.  If the hyperkalemia is less severe or dangerous, the underlying cause is treated. This can include taking medicines if needed. Your prescription medicines may be changed. You may also need to take a medicine to help your body get rid of potassium. You may need to eat a diet low in potassium. HOME CARE INSTRUCTIONS   Take medicines and supplements as directed by your caregiver.  Do not take any over-the-counter medicines, supplements, natural products, herbs, or vitamins without reviewing them with your caregiver. Certain supplements and natural food products can have high amounts of potassium. Other products (such as ibuprofen) can damage weak kidneys and raise your potassium.  You may be asked to do repeat lab tests. Be sure to follow these directions.  If you have kidney disease, you may need to follow a low potassium diet. SEEK MEDICAL CARE IF:   You notice an irregular or very slow heartbeat.  You feel lightheaded.  You develop weakness that is unusual for you. SEEK IMMEDIATE MEDICAL CARE IF:   You have shortness of breath.  You have chest discomfort.  You pass out (faint). MAKE SURE YOU:   Understand these instructions.  Will watch your condition.  Will get help right away if you are not doing well or get worse. Document Released: 09/04/2002 Document Revised: 12/07/2011 Document Reviewed: 12/20/2013 [facility] Patient Information 2015 [place], [place].  This information is not intended to replace advice given to you by your health care provider. Make sure you discuss any questions you have with your health care provider.

## 2015-04-25 NOTE — Assessment & Plan Note (Signed)
Finally at goal with addition of spironolactone to current regimen.

## 2015-04-25 NOTE — Assessment & Plan Note (Signed)
LDL at goal on atorvastatin.  

## 2015-04-25 NOTE — Assessment & Plan Note (Signed)
Excellent control on metaglip.

## 2015-04-25 NOTE — Assessment & Plan Note (Signed)
Refill nystatin cream

## 2015-04-25 NOTE — Progress Notes (Signed)
Pre visit review using our clinic review tool, if applicable. No additional management support is needed unless otherwise documented below in the visit note. 

## 2015-04-25 NOTE — Assessment & Plan Note (Signed)
Likely cause of diarrhea. Modify diet. HAs follow up with GI upcoming.  On prevalite

## 2015-04-25 NOTE — Progress Notes (Signed)
Subjective:    Patient ID: Tonya [name], female    DOB: July 29, 1941, 74 y.o.   MRN: [id number]  HPI   74 year old female presents for 6 month follow up.  Hyperkalemia: K 5.7 on 7/25. Given kayexylate x1 ( She never took it, was scared to) Pt on ACEI and spirnolactone.  She has been eating  A ,ot of tomato sandwiches ( has stopped now), she trys to drink fluids.  She is no longer on potassium  Hypertension: Finally improved control on  Control with addition of spirolactone as well as continued use of  coreg, diltiazem, losartan/HCTZ. BP Readings from Last 3 Encounters:  04/25/15 134/60  12/10/14 160/80  10/26/14 170/80   Using medication without problems or lightheadedness: Occ off and on for longtime. Occ falls.  Chest pain with exertion: None  Edema less so with better improvement of BP  Short of breath: Mild  Average home BPs: 115/60  Other issues: CAD, afib: stable per cards, last OV in 11/2014  Diabetes: On metaglip, Excellent control since last check! Lab Results  Component Value Date   HGBA1C 5.5 04/22/2015  Hypoglycemic episodes:None  Hyperglycemic episodes:None  Feet problems:none Blood Sugars averaging: FBS 88-110 Eye exam within last year: 12/2013 She has had cataract surgery on both eyes in last few months.  Elevated Cholesterol:well controlled on atorvastatin 20 LDL at goal < 70!!! Lab Results  Component Value Date   CHOL 105 04/22/2015   HDL 44.70 04/22/2015   LDLCALC 33 04/22/2015   LDLDIRECT 50.0 10/22/2014   TRIG 136.0 04/22/2015   CHOLHDL 2 04/22/2015  Muscle aches: None  Other complaints Good  Diet now living at he daughters house,           Wt Readings from Last 3 Encounters:  04/25/15 161 lb 8 oz (73.256 kg)  12/10/14 173 lb (78.472 kg)  10/26/14 171 lb 4 oz (77.678 kg)  12 lb weight loss.     Review of Systems  Constitutional: Negative for fatigue.  HENT: Negative for ear pain.   Eyes: Negative for pain.    Respiratory: Negative for shortness of breath.   Cardiovascular: Negative for chest pain and palpitations.  Gastrointestinal: Negative for abdominal pain.   She has had some diarrhea off and on.. Thinks is due to the frequent tomatos in diet  Also has recurrence of yeast in her leg and stomach folds.    Objective:   Physical Exam  Constitutional: Vital signs are normal. She appears well-developed and well-nourished. She is cooperative.  Non-toxic appearance. She does not appear ill. No distress.  HENT:  Head: Normocephalic.  Right Ear: Hearing, tympanic membrane, external ear and ear canal normal. Tympanic membrane is not erythematous, not retracted and not bulging.  Left Ear: Hearing, tympanic membrane, external ear and ear canal normal. Tympanic membrane is not erythematous, not retracted and not bulging.  Nose: No mucosal edema or rhinorrhea. Right sinus exhibits no maxillary sinus tenderness and no frontal sinus tenderness. Left sinus exhibits no maxillary sinus tenderness and no frontal sinus tenderness.  Mouth/Throat: Uvula is midline, oropharynx is clear and moist and mucous membranes are normal.  Eyes: Conjunctivae, EOM and lids are normal. Pupils are equal, round, and reactive to light. Lids are everted and swept, no foreign bodies found.  Neck: Trachea normal and normal range of motion. Neck supple. Carotid bruit is not present. No thyroid mass and no thyromegaly present.  Cardiovascular: Normal rate, regular rhythm, S1 normal, S2 normal, normal heart  sounds, intact distal pulses and normal pulses.  Exam reveals no gallop and no friction rub.   No murmur heard. Pulmonary/Chest: Effort normal and breath sounds normal. No tachypnea. No respiratory distress. She has no decreased breath sounds. She has no wheezes. She has no rhonchi. She has no rales.  Abdominal: Soft. Normal appearance and bowel sounds are normal. There is no tenderness.  Neurological: She is alert.  Skin: Skin is  warm, dry and intact. No rash noted.  Psychiatric: Her speech is normal and behavior is normal. Judgment and thought content normal. Her mood appears not anxious. Cognition and memory are normal. She does not exhibit a depressed mood.     Diabetic foot exam: Normal inspection No skin breakdown  calluses  On left 5th digit, blister gone, also callus on bilaterral great toes Normal DP pulses Normal sensation to light touch and monofilament Nails normal      Assessment & Plan:

## 2015-04-26 ENCOUNTER — Ambulatory Visit: Payer: Medicare Other | Admitting: Family Medicine

## 2015-04-26 ENCOUNTER — Other Ambulatory Visit: Payer: Self-pay | Admitting: Cardiology

## 2015-04-29 ENCOUNTER — Other Ambulatory Visit: Payer: Self-pay | Admitting: Cardiology

## 2015-04-29 NOTE — Telephone Encounter (Signed)
REFILL 

## 2015-05-02 ENCOUNTER — Other Ambulatory Visit (INDEPENDENT_AMBULATORY_CARE_PROVIDER_SITE_OTHER): Payer: Medicare Other

## 2015-05-02 DIAGNOSIS — E875 Hyperkalemia: Secondary | ICD-10-CM

## 2015-05-02 LAB — BASIC METABOLIC PANEL
BUN: 28 mg/dL — ABNORMAL HIGH (ref 6–23)
CHLORIDE: 108 meq/L (ref 96–112)
CO2: 23 meq/L (ref 19–32)
Calcium: 9.3 mg/dL (ref 8.4–10.5)
Creatinine, Ser: 1.39 mg/dL — ABNORMAL HIGH (ref 0.40–1.20)
GFR: 39.27 mL/min — ABNORMAL LOW (ref >60.00)
Glucose, Bld: 91 mg/dL (ref 70–99)
POTASSIUM: 4.5 meq/L (ref 3.5–5.1)
SODIUM: 138 meq/L (ref 135–145)

## 2015-05-02 NOTE — Addendum Note (Signed)
Addended by: Marchia Bond on: 05/02/2015 08:19 AM   Modules accepted: Orders

## 2015-05-07 ENCOUNTER — Encounter: Payer: Self-pay | Admitting: *Deleted

## 2015-05-13 ENCOUNTER — Encounter: Payer: Self-pay | Admitting: Physician Assistant

## 2015-05-13 ENCOUNTER — Ambulatory Visit (INDEPENDENT_AMBULATORY_CARE_PROVIDER_SITE_OTHER): Payer: Medicare Other | Admitting: Physician Assistant

## 2015-05-13 VITALS — BP 138/70 | HR 60 | Ht 66.0 in | Wt 160.8 lb

## 2015-05-13 DIAGNOSIS — Z85038 Personal history of other malignant neoplasm of large intestine: Secondary | ICD-10-CM

## 2015-05-13 DIAGNOSIS — R197 Diarrhea, unspecified: Secondary | ICD-10-CM | POA: Diagnosis not present

## 2015-05-13 MED ORDER — CHOLESTYRAMINE LIGHT 4 G PO PACK: PACK | ORAL | Status: DC

## 2015-05-13 NOTE — Patient Instructions (Signed)
We sent prescription refills for Prevalite packets, to [facility] [address], [place], [place].  Take at least 2 hours away from other medications.

## 2015-05-13 NOTE — Progress Notes (Signed)
Patient ID: Tonya [name], female   DOB: 1941/05/30, 74 y.o.   MRN: [id number]   Subjective:    Patient ID: Tonya [name], female    DOB: 1941/09/18, 74 y.o.   MRN: [id number]  HPI Tonya [name] is a pleasant 74 year old white female known to Dr. Ardis [name] who comes in today for follow-up and med refills. She has history of early stage synchronous right-sided colon cancers diagnosed and resected in March 2010. She did not require any adjuvant chemotherapy. She has had serial interval follow-up and last colonoscopy was done in March 2014. She had 3 sessile polyps removed which were tubular adenomas and she is recommended for 5 year interval follow-up. She has had problems with diarrhea since her right-sided hemicolectomy and terminal ileal resection. She has done well with Questran and has maintained this twice daily. She says she still has problems with significant diarrhea if she does not take Questran. Occasionally still has cramping and episodes of diarrhea even with the Questran. She denies any regular abdominal pain only if she has cramping preceding diarrhea. Appetite fine, and weight has been decreasing over the past 3-4 pounds months. She says she is down about 12 pounds but also started on spironolactone around that same time. She has no concerns about changes in her bowels, abdominal discomfort ,melena or hematochezia.  Review of Systems Pertinent positive and negative review of systems were noted in the above HPI section.  All other review of systems was otherwise negative.  Outpatient Encounter Prescriptions as of 05/13/2015  Medication Sig  . acetaminophen (TYLENOL) 325 MG tablet Take 2 tablets (650 mg total) by mouth every 6 (six) hours as needed for mild pain (or Fever >/= 101).  Marland Kitchen aspirin 81 MG tablet Take 81 mg by mouth daily.  Marland Kitchen atorvastatin (LIPITOR) 20 MG tablet TAKE 1 TABLET BY MOUTH DAILY.  . calcium-vitamin D (OSCAL WITH D 500-200) 500-200 MG-UNIT per tablet Take 1 tablet by mouth 2  (two) times daily.    . carvedilol (COREG) 12.5 MG tablet TAKE 1 TABLET BY MOUTH 2 TIMES DAILY WITH A MEAL.  . cetirizine (ZYRTEC) 10 MG tablet Take 10 mg by mouth daily as needed for allergies.  . cholestyramine light (PREVALITE) 4 G packet TAKE 1 PACKET TWICE A DAY WITH A MEAL, take at least 2 hours away from other medications.  . citalopram (CELEXA) 40 MG tablet TAKE 1 TABLET BY MOUTH EVERY DAY  . diltiazem (CARDIZEM CD) 240 MG 24 hr capsule TAKE ONE CAPSULE BY MOUTH EVERY DAY  . ferrous sulfate 325 (65 FE) MG EC tablet Take 325 mg by mouth 2 (two) times daily.  Marland Kitchen glipizide-metformin (METAGLIP) 2.5-250 MG per tablet TAKE 2 TABLETS BY MOUTH TWICE A DAY BEFORE A MEAL  . losartan-hydrochlorothiazide (HYZAAR) 100-25 MG per tablet Take 1 tablet by mouth daily.  . Multiple Vitamin (MULTIVITAMIN WITH MINERALS) TABS Take 1 tablet by mouth daily.  Marland Kitchen nystatin cream (MYCOSTATIN) Apply 1 application topically 2 (two) times daily as needed for dry skin.  Marland Kitchen omeprazole (PRILOSEC) 20 MG capsule TAKE 2 CAPSULES BY MOUTH EVERY DAY  . sodium polystyrene (KAYEXALATE) 15 GM/60ML suspension Take 60 mLs (15 g total) by mouth once.  Marland Kitchen spironolactone (ALDACTONE) 25 MG tablet Take 1 tablet (25 mg total) by mouth daily.  . [DISCONTINUED] cholestyramine light (PREVALITE) 4 G packet TAKE 1 PACKET TWICE A DAY WITH A MEAL   No facility-administered encounter medications on file as of 05/13/2015.   Allergies  Allergen Reactions  .  Amoxicillin-Pot Clavulanate Swelling    Augmentin per patient, tongue swelling   Patient Active Problem List   Diagnosis Date Noted  . Hyperkalemia 04/25/2015  . Counseling regarding end of life decision making 10/26/2014  . S/P total knee replacement using cement 04/03/2014  . Intertrigo 04/25/2013  . Urinary urgency 10/18/2012  . ATRIAL FIBRILLATION, PAROXYSMAL 10/17/2010  . ALLERGIC RHINITIS 01/23/2009  . ADENOCARCINOMA, COLON, CECUM 10/05/2008  . CORONARY ARTERY DISEASE 11/23/2007    . Hyperlipidemia 09/19/2007  . ANXIETY 09/19/2007  . DEPRESSION 09/19/2007  . GERD 09/19/2007  . Diabetes mellitus with no complication 123XX123  . SYNDROME, CARPAL TUNNEL 03/21/2007  . Essential hypertension, benign 03/21/2007  . IBS 03/21/2007  . Osteoarthrosis, unspecified whether generalized or localized, unspecified site 03/21/2007   Social History   Social History  . Marital Status: Divorced    Spouse Name: N/A  . Number of Children: 3  . Years of Education: N/A   Occupational History  . retired   .     Social History Main Topics  . Smoking status: Never Smoker   . Smokeless tobacco: Never Used  . Alcohol Use: No  . Drug Use: No  . Sexual Activity: No   Other Topics Concern  . Not on file   Social History Narrative   No regular exercise, limited due to knees   Diet- addicted to sweets, some fruit and veggies, water daily.    Tonya [name] family history includes Heart attack in her mother; Heart disease in her father and mother; Prostate cancer in her father. There is no history of Colon cancer.      Objective:    Filed Vitals:   05/13/15 0921  BP: 138/70  Pulse: 60    Physical Exam  well-developed older white female in no acute distress, quite pleasant blood pressure 138/70 pulse 60 height 5 foot 6 weight 160. HEENT; nontraumatic normocephalic EOMI PERRLA sclera anicteric, Supple ;no JVD, Cardiovascular; regular rate and rhythm with S1-S2 no murmur or gallop, Pulmonary ;clear bilaterally, Abdomen ;soft, nontender nondistended bowel sounds are present there is no palpable mass or hepatosplenomegaly, lower midline incisional scar, Rectal; exam not done, Extremities; no clubbing, cyanosis ,or edema skin warm and dry, Neuropsych ;mood and affect appropriate       Assessment & Plan:   #1 74 yo female with hx of early stage  Synchronous right sided Colon cancers 2010- s/p resection with right hemicolectomy and TI resection Up to date with colon screening-  last colon 11/2012 #2 adenomatous polyps #3 post colon resection chronic diarrhea #4 AODM  #5 anxiety #6 afib  Plan; Refill questran 4 gm BID x one year May use imodium prn  Plan follow up colonoscopy in 2019 Follow up with Dr.Jacobs or myself  In the interim as needed   Amy Genia Harold PA-C 05/13/2015   Cc: [name], MD

## 2015-05-13 NOTE — Progress Notes (Signed)
i agree with the above  Note, plan

## 2015-05-27 ENCOUNTER — Other Ambulatory Visit: Payer: Self-pay | Admitting: Family Medicine

## 2015-05-27 ENCOUNTER — Other Ambulatory Visit (INDEPENDENT_AMBULATORY_CARE_PROVIDER_SITE_OTHER): Payer: Medicare Other

## 2015-05-27 DIAGNOSIS — E875 Hyperkalemia: Secondary | ICD-10-CM | POA: Diagnosis not present

## 2015-05-27 LAB — BASIC METABOLIC PANEL
BUN: 23 mg/dL (ref 6–23)
CHLORIDE: 112 meq/L (ref 96–112)
CO2: 21 mEq/L (ref 19–32)
Calcium: 9.3 mg/dL (ref 8.4–10.5)
Creatinine, Ser: 1.5 mg/dL — ABNORMAL HIGH (ref 0.40–1.20)
GFR: 35.96 mL/min — ABNORMAL LOW (ref >60.00)
Glucose, Bld: 56 mg/dL — ABNORMAL LOW (ref 70–99)
Potassium: 4.7 mEq/L (ref 3.5–5.1)
SODIUM: 141 meq/L (ref 135–145)

## 2015-06-05 ENCOUNTER — Other Ambulatory Visit: Payer: Self-pay | Admitting: Cardiology

## 2015-06-10 ENCOUNTER — Ambulatory Visit (INDEPENDENT_AMBULATORY_CARE_PROVIDER_SITE_OTHER): Payer: Medicare Other | Admitting: Cardiology

## 2015-06-10 ENCOUNTER — Encounter: Payer: Self-pay | Admitting: Cardiology

## 2015-06-10 VITALS — BP 110/58 | HR 60 | Ht 65.0 in | Wt 160.8 lb

## 2015-06-10 DIAGNOSIS — I10 Essential (primary) hypertension: Secondary | ICD-10-CM

## 2015-06-10 DIAGNOSIS — I2510 Atherosclerotic heart disease of native coronary artery without angina pectoris: Secondary | ICD-10-CM

## 2015-06-10 DIAGNOSIS — I480 Paroxysmal atrial fibrillation: Secondary | ICD-10-CM | POA: Diagnosis not present

## 2015-06-10 DIAGNOSIS — Z23 Encounter for immunization: Secondary | ICD-10-CM | POA: Diagnosis not present

## 2015-06-10 NOTE — Progress Notes (Signed)
HPI The patient has  coronary disease as described below and she follows for her BP.  At the last visit I added spironolactone to her regimen. Her potassium was elevated on a couple of appointments but has since come down in the last 2 checks to be normal. Her blood pressure is much better. She brings a diary and I reviewed this.   She says she's been doing well. She denies any cardiovascular symptoms. She walks up and down her stairs frequently. The patient denies any new symptoms such as chest discomfort, neck or arm discomfort. There has been no new shortness of breath, PND or orthopnea. There have been no reported palpitations, presyncope or syncope.  Allergies  Allergen Reactions  . Amoxicillin-Pot Clavulanate Swelling    Augmentin per patient, tongue swelling    Current Outpatient Prescriptions  Medication Sig Dispense Refill  . acetaminophen (TYLENOL) 325 MG tablet Take 2 tablets (650 mg total) by mouth every 6 (six) hours as needed for mild pain (or Fever >/= 101).    Marland Kitchen aspirin 81 MG tablet Take 81 mg by mouth daily.    Marland Kitchen atorvastatin (LIPITOR) 20 MG tablet TAKE 1 TABLET BY MOUTH DAILY. 90 tablet 0  . calcium-vitamin D (OSCAL WITH D 500-200) 500-200 MG-UNIT per tablet Take 1 tablet by mouth 2 (two) times daily.      . carvedilol (COREG) 12.5 MG tablet TAKE 1 TABLET BY MOUTH 2 TIMES DAILY WITH A MEAL. 180 tablet 1  . cetirizine (ZYRTEC) 10 MG tablet Take 10 mg by mouth daily as needed for allergies.    . cholestyramine light (PREVALITE) 4 G packet TAKE 1 PACKET TWICE A DAY WITH A MEAL, take at least 2 hours away from other medications. 60 packet 1  . citalopram (CELEXA) 40 MG tablet TAKE 1 TABLET BY MOUTH EVERY DAY 90 tablet 1  . diltiazem (CARDIZEM CD) 240 MG 24 hr capsule TAKE ONE CAPSULE BY MOUTH EVERY DAY 90 capsule 1  . ferrous sulfate 325 (65 FE) MG EC tablet Take 325 mg by mouth 2 (two) times daily.    Marland Kitchen glipizide-metformin (METAGLIP) 2.5-250 MG per tablet TAKE 2 TABLETS BY  MOUTH TWICE A DAY BEFORE A MEAL 360 tablet 1  . losartan-hydrochlorothiazide (HYZAAR) 100-25 MG per tablet TAKE 1 TABLET BY MOUTH DAILY. 90 tablet 3  . Multiple Vitamin (MULTIVITAMIN WITH MINERALS) TABS Take 1 tablet by mouth daily.    Marland Kitchen nystatin cream (MYCOSTATIN) Apply 1 application topically 2 (two) times daily as needed for dry skin. 30 g 1  . omeprazole (PRILOSEC) 20 MG capsule TAKE 2 CAPSULES BY MOUTH EVERY DAY 180 capsule 1  . sodium polystyrene (KAYEXALATE) 15 GM/60ML suspension Take 60 mLs (15 g total) by mouth once. 60 mL 0  . spironolactone (ALDACTONE) 25 MG tablet Take 1 tablet (25 mg total) by mouth daily. 90 tablet 3   No current facility-administered medications for this visit.    Past Medical History  Diagnosis Date  . Osteoarthritis   . Edema, peripheral   . CAD (coronary artery disease)     s/p BMS to LAD  . Breast pain     left  . Hyperlipidemia   . Hypertension   . Sinusitis     acute- NOS  . Allergic rhinitis   . Adenocarcinoma, colon   . Sciatica   . Cystitis, acute   . Chronic diarrhea   . GERD (gastroesophageal reflux disease)   . Depression   . Anxiety   .  Osteoarthritis   . IBS (irritable bowel syndrome)   . Degenerative disk disease   . Carpal tunnel syndrome   . Diabetes mellitus, type 2   . History of pneumonia   . Nocturia   . Fall at home 03/16/2014    no fx; multiple facial lacerations, abrasions and hematoma  . Dysrhythmia     h/o AFIB  . S/P coronary artery stent placement 2007  . Anemia     iron deficiency  . Colon polyps     Tubular adenoma    Past Surgical History  Procedure Laterality Date  . Knee arthroscopy      bilateral  . Cholecystectomy    . Cardiac catheterization  12/10    LAD stent patent. Insignificant CAD, otherwise EF 60-65%  . Partial colectomy  11/2008    right, for adenocarcinoma  . Replacement total knee  [id number]    right, complicated by hemarthrosis   . Hematoma evacuation      after knee replacement  .  Total knee revision  03/08/2012    Procedure: TOTAL KNEE REVISION;  Surgeon: Garald Balding, MD;  Location: [place] [facility];  Service: Orthopedics;  Laterality: Right;  removal total knee hardware and placement of antibiotic cement spacer and antibiotic beads  . Carpal tunnel release    . Total knee revision  05/17/2012    Procedure: TOTAL KNEE REVISION;  Surgeon: Garald Balding, MD;  Location: [place];  Service: Orthopedics;  Laterality: Right;  right total knee revision, removal of antibiotic spacer  . Total knee arthroplasty Left 04/03/2014    dr whitfield  . Total knee arthroplasty Left 04/03/2014    Procedure: TOTAL KNEE ARTHROPLASTY;  Surgeon: Garald Balding, MD;  Location: [facility];  Service: Orthopedics;  Laterality: Left;  . Cataract extraction  03/2015    ROS:  Back pain.  Otherwise as stated in the HPI and negative for all other systems.  PHYSICAL EXAM BP 110/58 mmHg  Pulse 60  Ht 5\' 5"  (1.651 m)  Wt 160 lb 12.8 oz (72.938 kg)  BMI 26.76 kg/m2 GENERAL:  Well appearing NECK:  No jugular venous distention, waveform within normal limits, carotid upstroke brisk and symmetric, no bruits, no thyromegaly LUNGS:  Clear to auscultation bilaterally BACK:  No CVA tenderness CHEST:  Unremarkable HEART:  PMI not displaced or sustained,S1 and S2 within normal limits, no S3, no S4, no clicks, no rubs, no murmurs ABD:  Flat, positive bowel sounds normal in frequency in pitch, no bruits, no rebound, no guarding, no midline pulsatile mass, no hepatomegaly, no splenomegaly EXT:  2 plus pulses throughout, mild left leg edema unchanged, no cyanosis no clubbing  EKG:  Sinus rhythm, rate 58, axis within normal limits, intervals within normal limits, no acute ST-T wave changes. 06/10/2015   ASSESSMENT AND PLAN  CAD:  The patient has no new sypmtoms.  No further cardiovascular testing is indicated.  We will continue with aggressive risk reduction and meds as listed.  HTN:   The blood pressure is at target.  No change in medications is indicated. We will continue with therapeutic lifestyle changes (TLC).  DYSLIPIDEMIA:  She is at target with her lipid level and I reviewed her labs drawn recently.  (LDL 33 calculated with HDL of 45).  She will remain on the meds as listed.  DM:  A1C was 5.5 in July

## 2015-06-10 NOTE — Patient Instructions (Signed)
Medication Instructions:  Your physician recommends that you continue on your current medications as directed. Please refer to the Current Medication list given to you today.   Labwork: NONE  Testing/Procedures: NONE  Follow-Up: Your physician wants you to follow-up in: 1 year with Dr. Percival Spanish. You will receive a reminder letter in the mail two months in advance. If you don't receive a letter, please call our office to schedule the follow-up appointment.   Any Other Special Instructions Will Be Listed Below (If Applicable).

## 2015-06-14 ENCOUNTER — Other Ambulatory Visit: Payer: Self-pay | Admitting: Family Medicine

## 2015-06-19 ENCOUNTER — Other Ambulatory Visit: Payer: Self-pay | Admitting: Family Medicine

## 2015-06-20 DIAGNOSIS — M5415 Radiculopathy, thoracolumbar region: Secondary | ICD-10-CM | POA: Diagnosis not present

## 2015-06-20 DIAGNOSIS — M4806 Spinal stenosis, lumbar region: Secondary | ICD-10-CM | POA: Diagnosis not present

## 2015-06-21 ENCOUNTER — Other Ambulatory Visit: Payer: Self-pay | Admitting: Family Medicine

## 2015-06-21 DIAGNOSIS — M4806 Spinal stenosis, lumbar region: Secondary | ICD-10-CM | POA: Diagnosis not present

## 2015-06-21 DIAGNOSIS — M5416 Radiculopathy, lumbar region: Secondary | ICD-10-CM | POA: Diagnosis not present

## 2015-06-21 DIAGNOSIS — E875 Hyperkalemia: Secondary | ICD-10-CM

## 2015-06-25 ENCOUNTER — Other Ambulatory Visit (INDEPENDENT_AMBULATORY_CARE_PROVIDER_SITE_OTHER): Payer: Medicare Other

## 2015-06-25 ENCOUNTER — Encounter: Payer: Self-pay | Admitting: Family Medicine

## 2015-06-25 DIAGNOSIS — E875 Hyperkalemia: Secondary | ICD-10-CM | POA: Diagnosis not present

## 2015-06-25 DIAGNOSIS — N184 Chronic kidney disease, stage 4 (severe): Secondary | ICD-10-CM

## 2015-06-25 DIAGNOSIS — E1122 Type 2 diabetes mellitus with diabetic chronic kidney disease: Secondary | ICD-10-CM | POA: Insufficient documentation

## 2015-06-25 LAB — BASIC METABOLIC PANEL
BUN: 26 mg/dL — ABNORMAL HIGH (ref 6–23)
CALCIUM: 9.4 mg/dL (ref 8.4–10.5)
CO2: 23 mEq/L (ref 19–32)
Chloride: 113 mEq/L — ABNORMAL HIGH (ref 96–112)
Creatinine, Ser: 1.47 mg/dL — ABNORMAL HIGH (ref 0.40–1.20)
GFR: 36.8 mL/min — AB (ref >60.00)
GLUCOSE: 74 mg/dL (ref 70–99)
Potassium: 4.4 mEq/L (ref 3.5–5.1)
SODIUM: 143 meq/L (ref 135–145)

## 2015-07-18 ENCOUNTER — Other Ambulatory Visit: Payer: Self-pay | Admitting: Physician Assistant

## 2015-07-18 ENCOUNTER — Telehealth: Payer: Self-pay | Admitting: *Deleted

## 2015-07-18 NOTE — Telephone Encounter (Signed)
Pt left voicemail at Triage. Pt called to check the status of the surgical clearance form from [facility], pt said they sent letter to [place] a while ago (didn't say exact date), and they have not received the clearance letter back, pt request that we send them the form back asap

## 2015-07-18 NOTE — Telephone Encounter (Signed)
This was completed last week. Please check on it.

## 2015-07-19 NOTE — Telephone Encounter (Signed)
Surgical Clearance Form faxed to [facility] [phone] Attn: [place].

## 2015-07-25 ENCOUNTER — Telehealth: Payer: Self-pay | Admitting: *Deleted

## 2015-07-25 NOTE — Telephone Encounter (Signed)
Requesting surgical clearance:   1. Type of surgery: Lumbar Laminectomy  2. Surgeon: Basil Dess  3. Surgical date: Pending  4. Medications that need to be help: Aspirin  5. Phone # [phone] and Fax # [phone]  You saw pt on 06/10/2015, is pt cleared for Surgery? Last Echo 2010 and last stress test 2013.

## 2015-07-30 NOTE — Telephone Encounter (Signed)
The patient has had no new symptoms since her stress test in 2013.  Therefore, based on ACC/AHA guidelines, the patient would be at acceptable risk for the planned procedure without further cardiovascular testing.  Can hold ASA.

## 2015-07-31 NOTE — Telephone Encounter (Signed)
Surgical clearance faxed to [facility]

## 2015-09-02 ENCOUNTER — Ambulatory Visit (INDEPENDENT_AMBULATORY_CARE_PROVIDER_SITE_OTHER): Payer: Medicare Other | Admitting: Family Medicine

## 2015-09-02 ENCOUNTER — Encounter: Payer: Self-pay | Admitting: Family Medicine

## 2015-09-02 ENCOUNTER — Telehealth: Payer: Self-pay | Admitting: *Deleted

## 2015-09-02 VITALS — BP 116/56 | HR 62 | Temp 98.2°F | Ht 65.0 in | Wt 161.2 lb

## 2015-09-02 DIAGNOSIS — B9789 Other viral agents as the cause of diseases classified elsewhere: Principal | ICD-10-CM

## 2015-09-02 DIAGNOSIS — I2510 Atherosclerotic heart disease of native coronary artery without angina pectoris: Secondary | ICD-10-CM

## 2015-09-02 DIAGNOSIS — J028 Acute pharyngitis due to other specified organisms: Principal | ICD-10-CM

## 2015-09-02 DIAGNOSIS — J029 Acute pharyngitis, unspecified: Secondary | ICD-10-CM

## 2015-09-02 NOTE — Progress Notes (Signed)
Pre visit review using our clinic review tool, if applicable. No additional management support is needed unless otherwise documented below in the visit note. 

## 2015-09-02 NOTE — Progress Notes (Signed)
   Subjective:    Patient ID: Tonya [name], female    DOB: 1941/09/09, 74 y.o.   MRN: [id number]  Sore Throat  This is a new problem. The current episode started in the past 7 days (2-3 days ago, has had a clot of naslacongestion in last 3 weeks). The problem has been gradually worsening. The pain is worse on the right side. There has been no fever. The pain is moderate. Associated symptoms include congestion, coughing, ear pain, headaches, swollen glands and trouble swallowing. Pertinent negatives include no ear discharge, shortness of breath or vomiting. Associated symptoms comments: Right ear pain  post nasal drip. She has had no exposure to strep or mono. Exposure to: viral infection in grandkids. She has tried nothing for the symptoms. The treatment provided mild relief.    Social History /Family History/Past Medical History reviewed and updated if needed.   Review of Systems  HENT: Positive for congestion, ear pain and trouble swallowing. Negative for ear discharge.   Respiratory: Positive for cough. Negative for shortness of breath.   Gastrointestinal: Negative for vomiting.  Neurological: Positive for headaches.       Objective:   Physical Exam  Constitutional: Vital signs are normal. She appears well-developed and well-nourished. She is cooperative.  Non-toxic appearance. She does not appear ill. No distress.  HENT:  Head: Normocephalic.  Right Ear: Hearing, tympanic membrane, external ear and ear canal normal. Tympanic membrane is not erythematous, not retracted and not bulging.  Left Ear: Hearing, tympanic membrane, external ear and ear canal normal. Tympanic membrane is not erythematous, not retracted and not bulging.  Nose: Mucosal edema and rhinorrhea present. Right sinus exhibits no maxillary sinus tenderness and no frontal sinus tenderness. Left sinus exhibits no maxillary sinus tenderness and no frontal sinus tenderness.  Mouth/Throat: Uvula is midline and mucous  membranes are normal. Posterior oropharyngeal erythema present. No oropharyngeal exudate or posterior oropharyngeal edema.  Eyes: Conjunctivae, EOM and lids are normal. Pupils are equal, round, and reactive to light. Lids are everted and swept, no foreign bodies found.  Neck: Trachea normal and normal range of motion. Neck supple. Carotid bruit is not present. No thyroid mass and no thyromegaly present.  Cardiovascular: Normal rate, regular rhythm, S1 normal, S2 normal, normal heart sounds, intact distal pulses and normal pulses.  Exam reveals no gallop and no friction rub.   No murmur heard. Pulmonary/Chest: Effort normal and breath sounds normal. No tachypnea. No respiratory distress. She has no decreased breath sounds. She has no wheezes. She has no rhonchi. She has no rales.  Neurological: She is alert.  Skin: Skin is warm, dry and intact. No rash noted.  Psychiatric: Her speech is normal and behavior is normal. Judgment normal. Her mood appears not anxious. Cognition and memory are normal. She does not exhibit a depressed mood.          Assessment & Plan:

## 2015-09-02 NOTE — Telephone Encounter (Signed)
Requesting surgical clearance:   1. Type of surgery: Lumbar laminectomy L3-4 and L4-5  2. Surgeon: Dr Basil Dess   3. Surgical date: Pending  4. Medications that need to be help: Aspirin  5. [facility] : (P) [phone] (F) [phone]  Pt saw you last on 09/12, is pt cleared for surgery and how long can pt hold aspirin

## 2015-09-02 NOTE — Patient Instructions (Signed)
Push fluids, rest. Use tylenol for sore throat pain. Expect lasting 5-7 days more.

## 2015-09-03 NOTE — Telephone Encounter (Signed)
Please refer to my response to this same question last month.

## 2015-09-04 NOTE — Telephone Encounter (Signed)
surgical form already faxed to [facility] last month

## 2015-09-16 ENCOUNTER — Other Ambulatory Visit: Payer: Self-pay | Admitting: Physician Assistant

## 2015-09-17 ENCOUNTER — Telehealth: Payer: Self-pay | Admitting: *Deleted

## 2015-09-17 MED ORDER — CITALOPRAM HYDROBROMIDE 20 MG PO TABS: 20.0000 mg | ORAL_TABLET | Freq: Every day | ORAL | Status: DC

## 2015-09-17 NOTE — Telephone Encounter (Signed)
Received fax from [facility] stating that patient's over the age of 5 should not take citalopram over 20 mg daily due to can cause heart issues.  Spoke with Tonya [name] to advise her of the risk and to see if she would be willing to decrease her citalopram down to 20 mg daily or change medications.  Tonya [name] wants to decrease her citalopram to 20 mg daily to see how that goes.  New prescription sent in to her pharmacy.

## 2015-10-03 ENCOUNTER — Other Ambulatory Visit: Payer: Self-pay

## 2015-10-03 DIAGNOSIS — Z1231 Encounter for screening mammogram for malignant neoplasm of breast: Secondary | ICD-10-CM

## 2015-10-19 ENCOUNTER — Other Ambulatory Visit: Payer: Self-pay | Admitting: Cardiology

## 2015-10-21 NOTE — Telephone Encounter (Signed)
Rx request sent to pharmacy.  

## 2015-10-24 ENCOUNTER — Other Ambulatory Visit (INDEPENDENT_AMBULATORY_CARE_PROVIDER_SITE_OTHER): Payer: Medicare Other

## 2015-10-24 ENCOUNTER — Telehealth: Payer: Self-pay | Admitting: Family Medicine

## 2015-10-24 DIAGNOSIS — E119 Type 2 diabetes mellitus without complications: Secondary | ICD-10-CM

## 2015-10-24 DIAGNOSIS — E785 Hyperlipidemia, unspecified: Secondary | ICD-10-CM | POA: Diagnosis not present

## 2015-10-24 LAB — COMPREHENSIVE METABOLIC PANEL
ALT: 18 U/L (ref 0–35)
AST: 22 U/L (ref 0–37)
Albumin: 4.1 g/dL (ref 3.5–5.2)
Alkaline Phosphatase: 68 U/L (ref 39–117)
BILIRUBIN TOTAL: 0.4 mg/dL (ref 0.2–1.2)
BUN: 26 mg/dL — ABNORMAL HIGH (ref 6–23)
CALCIUM: 9 mg/dL (ref 8.4–10.5)
CHLORIDE: 109 meq/L (ref 96–112)
CO2: 24 meq/L (ref 19–32)
Creatinine, Ser: 1.5 mg/dL — ABNORMAL HIGH (ref 0.40–1.20)
GFR: 35.92 mL/min — AB (ref >60.00)
Glucose, Bld: 114 mg/dL — ABNORMAL HIGH (ref 70–99)
POTASSIUM: 5 meq/L (ref 3.5–5.1)
Sodium: 140 mEq/L (ref 135–145)
Total Protein: 6.8 g/dL (ref 6.0–8.3)

## 2015-10-24 LAB — LIPID PANEL
Cholesterol: 78 mg/dL (ref 0–200)
HDL: 38.6 mg/dL — ABNORMAL LOW (ref >39.00)
LDL Cholesterol: 22 mg/dL (ref 0–99)
NonHDL: 39.83
Total CHOL/HDL Ratio: 2
Triglycerides: 87 mg/dL (ref 0.0–149.0)
VLDL: 17.4 mg/dL (ref 0.0–40.0)

## 2015-10-24 LAB — HEMOGLOBIN A1C: Hgb A1c MFr Bld: 5.6 % (ref 4.6–6.5)

## 2015-10-24 NOTE — Telephone Encounter (Signed)
-----   Message from Ellamae Sia sent at 10/18/2015 11:26 AM EST ----- Regarding: Lab orders for Thursday, 1.26.17 Patient is scheduled for CPX labs, please order future labs, Thanks , Karna Christmas

## 2015-10-28 ENCOUNTER — Ambulatory Visit
Admission: RE | Admit: 2015-10-28 | Discharge: 2015-10-28 | Disposition: A | Discharge status: Home / Self Care | Payer: Medicare Other | Source: Ambulatory Visit

## 2015-10-28 DIAGNOSIS — Z1231 Encounter for screening mammogram for malignant neoplasm of breast: Secondary | ICD-10-CM

## 2015-10-29 ENCOUNTER — Encounter: Payer: Self-pay | Admitting: Family Medicine

## 2015-10-29 ENCOUNTER — Ambulatory Visit (INDEPENDENT_AMBULATORY_CARE_PROVIDER_SITE_OTHER): Payer: Medicare Other | Admitting: Family Medicine

## 2015-10-29 VITALS — BP 114/64 | HR 100 | Temp 97.4°F | Ht 65.0 in | Wt 163.0 lb

## 2015-10-29 DIAGNOSIS — I10 Essential (primary) hypertension: Secondary | ICD-10-CM

## 2015-10-29 DIAGNOSIS — N183 Chronic kidney disease, stage 3 unspecified: Secondary | ICD-10-CM

## 2015-10-29 DIAGNOSIS — N3946 Mixed incontinence: Secondary | ICD-10-CM | POA: Insufficient documentation

## 2015-10-29 DIAGNOSIS — Z0000 Encounter for general adult medical examination without abnormal findings: Secondary | ICD-10-CM | POA: Diagnosis not present

## 2015-10-29 DIAGNOSIS — Z7189 Other specified counseling: Secondary | ICD-10-CM

## 2015-10-29 DIAGNOSIS — E119 Type 2 diabetes mellitus without complications: Secondary | ICD-10-CM

## 2015-10-29 LAB — HM DIABETES FOOT EXAM

## 2015-10-29 NOTE — Assessment & Plan Note (Signed)
Has no living will or HCPOA, full code, has info packet (reviewed 2017)

## 2015-10-29 NOTE — Patient Instructions (Addendum)
Start Kegel exercises for stress incontinence.  Empty bladder regularly.  Call insurance about shingles vaccine coverage.

## 2015-10-29 NOTE — Assessment & Plan Note (Signed)
Well controlled. Continue current medication.  

## 2015-10-29 NOTE — Assessment & Plan Note (Signed)
Well controlled. Continue current medication. Encouraged exercise, weight loss, healthy eating habits.  

## 2015-10-29 NOTE — Assessment & Plan Note (Signed)
Stable control overall. Avoid nephrotoxic meds.

## 2015-10-29 NOTE — Progress Notes (Signed)
I have personally reviewed the Medicare Annual Wellness questionnaire and have noted 1. The patient's medical and social history 2. Their use of alcohol, tobacco or illicit drugs 3. Their current medications and supplements 4. The patient's functional ability including ADL's, fall risks, home safety risks and hearing or visual             impairment. 5. Diet and physical activities 6. Evidence for depression or mood disorders 7.         Updated provider list Cognitive evaluation was performed and recorded on pt medicare questionnaire form. The patients weight, height, BMI and visual acuity have been recorded in the chart  I have made referrals, counseling and provided education to the patient based review of the above and I have provided the pt with a written personalized care plan for preventive services.   Hypertension: Good control on coreg, diltiazem, losartan/HCTZ and spironolactone. BP Readings from Last 3 Encounters:  10/29/15 114/64  09/02/15 116/56  06/10/15 110/58  Using medication without problems or lightheadedness: none Chest pain with exertion: None  Edema Intermittent when on feet for long time.  Short of breath: Mild  Average home BPs:  Other issues: CAD, afib: stable per cards  Diabetes: On metaglip, Excellent control! Lab Results  Component Value Date   HGBA1C 5.6 10/24/2015  Hypoglycemic episodes:None  Hyperglycemic episodes:None  Feet problems:none Blood Sugars averaging: FBS 94-100 Eye exam within last year: 12/2014 bilateral cataract surgery 5 and 02/2015  Elevated Cholesterol: Well controlled on atorvastatin 20 mg  LDL at goal < 70! Lab Results  Component Value Date   CHOL 78 10/24/2015   HDL 38.60* 10/24/2015   LDLCALC 22 10/24/2015   LDLDIRECT 50.0 10/22/2014   TRIG 87.0 10/24/2015   CHOLHDL 2 10/24/2015  Muscle aches: None  Other complaints:  Moderate diet.  Minimal walking due to cold temperatures.  Body mass index is 27.12  kg/(m^2).  CKD, stable.  Review of Systems  Constitutional: Negative for fever and fatigue.  HENT: Negative for ear pain.  Eyes: Negative for pain.  Respiratory: Negative for chest tightness and shortness of breath.  Cardiovascular: Negative for chest pain, palpitations and leg swelling.  Gastrointestinal: Negative for abdominal pain.  Genitourinary: Negative for dysuria. Urinary frequency and urgency, incontinence with cough sneeze more frequent now, 1-2 UOP at night, every 2-3 hours during the day ( not bothering her enough to treat).  Objective:   Physical Exam  Constitutional: Vital signs are normal. She appears well-developed and well-nourished. She is cooperative. Non-toxic appearance. She does not appear ill. No distress.  HENT:  Head: Normocephalic.  Right Ear: Hearing, tympanic membrane, external ear and ear canal normal.  Left Ear: Hearing, tympanic membrane, external ear and ear canal normal.  Nose: Nose normal.  Eyes: Conjunctivae, EOM and lids are normal. Pupils are equal, round, and reactive to light. No foreign bodies found.  Neck: Trachea normal and normal range of motion. Neck supple. Carotid bruit is not present. No mass and no thyromegaly present.  Cardiovascular: Normal rate, regular rhythm, S1 normal, S2 normal, normal heart sounds and intact distal pulses. Exam reveals no gallop.  No murmur heard.  Pulmonary/Chest: Effort normal and breath sounds normal. No respiratory distress. She has no wheezes. She has no rhonchi. She has no rales.  Abdominal: Soft. Normal appearance and bowel sounds are normal. She exhibits no distension, no fluid wave, no abdominal bruit and no mass. There is no hepatosplenomegaly. There is no tenderness. There is no rebound, no  guarding and no CVA tenderness. No hernia.  Genitourinary: No breast swelling, tenderness, discharge or bleeding. Pelvic exam was performed with patient prone.  Lymphadenopathy:  She has no cervical  adenopathy.  She has no axillary adenopathy.  Neurological: She is alert. She has normal strength. No cranial nerve deficit or sensory deficit.  Skin: Skin is warm, dry and intact. No rash noted.  Psychiatric: Her speech is normal and behavior is normal. Judgment normal. Her mood appears not anxious. Cognition and memory are normal. She does not exhibit a depressed mood.   Diabetic foot exam:  blisters between toes, no sign of infection, keep shoe change. Normal inspection  No skin breakdown  No calluses  Normal DP pulses  Normal sensation to light tough and monofilament  Nails normal  Assessment & Plan:   Annual Medicare Wellness: The patient's preventative maintenance and recommended screening tests for an annual wellness exam were reviewed in full today.  Brought up to date unless services declined.  Counselled on the importance of diet, exercise, and its role in overall health and mortality.  The patient's FH and SH was reviewed, including their home life, tobacco status, and drug and alcohol status.   DEXA 10/2014 normal  Repeat in 5 years, 2021  Mammogram nml in 10/28/2015 Uptodate with colonoscopy with hx of colon cancer.. Last 11/2012, repeat in 5 years. PAP/DVE not indicated at this age. No vaginal bleeding.  Nonsmoker  Vaccine: uptodate except for shingles

## 2015-10-29 NOTE — Assessment & Plan Note (Addendum)
No very bothersome per pt. Start Kegels. Wear pads.  No symptoms of prolapse.  Not interested in medication to treat urge.

## 2015-10-29 NOTE — Addendum Note (Signed)
Addended byEliezer Lofts E on: 10/29/2015 10:44 AM   Modules accepted: Miquel Dunn

## 2015-10-29 NOTE — Progress Notes (Signed)
Pre visit review using our clinic review tool, if applicable. No additional management support is needed unless otherwise documented below in the visit note. 

## 2015-11-01 DIAGNOSIS — M4806 Spinal stenosis, lumbar region: Secondary | ICD-10-CM | POA: Diagnosis not present

## 2015-11-01 DIAGNOSIS — M5416 Radiculopathy, lumbar region: Secondary | ICD-10-CM | POA: Diagnosis not present

## 2015-11-07 ENCOUNTER — Other Ambulatory Visit: Payer: Self-pay | Admitting: Physician Assistant

## 2015-11-07 ENCOUNTER — Other Ambulatory Visit: Payer: Self-pay | Admitting: Specialist

## 2015-11-07 DIAGNOSIS — M545 Low back pain: Secondary | ICD-10-CM

## 2015-11-13 ENCOUNTER — Ambulatory Visit
Admission: RE | Admit: 2015-11-13 | Discharge: 2015-11-13 | Disposition: A | Discharge status: Home / Self Care | Payer: Medicare Other | Source: Ambulatory Visit | Attending: Specialist | Admitting: Specialist

## 2015-11-13 DIAGNOSIS — M4806 Spinal stenosis, lumbar region: Secondary | ICD-10-CM | POA: Diagnosis not present

## 2015-11-13 DIAGNOSIS — M545 Low back pain: Secondary | ICD-10-CM

## 2015-11-14 ENCOUNTER — Other Ambulatory Visit: Payer: Self-pay | Admitting: Specialist

## 2015-11-18 ENCOUNTER — Other Ambulatory Visit: Payer: Self-pay | Admitting: Family Medicine

## 2015-11-19 ENCOUNTER — Other Ambulatory Visit (HOSPITAL_COMMUNITY): Payer: Self-pay | Admitting: *Deleted

## 2015-11-19 NOTE — Pre-Procedure Instructions (Addendum)
Tonya [name]  11/19/2015      [id number] [place], [place] [address] [place] [address] Phone: [phone] Fax: [phone] - [place], [place] - [address] [place]ilda [place] [address] Phone: [phone] Fax: [phone]  [id number] [place], [facility] [place] [address] [place] [address] Phone: [phone] Fax: [phone]    Your procedure is scheduled on  Tuesday , November 26, 2015 at 12:35 PM.   Report to [facility] Entrance "A" Admitting Office at 10:30 AM.   Call this number if you have problems the morning of surgery: [phone]    Remember:  Do not eat food or drink liquids after midnight MONDAY.   Take these medicines the morning of surgery with A SIP OF WATER: Carvedilol (Coreg), Citalopram (Celexa), Diltiazem (Cardizem), Omeprazole (Prilosec), Tylenol - if needed, Cetirizine (Zyrtec) - if needed. STOP ASPIRIN, IBUPROFEN/ ADVIL/ MOTRIN, GOODY POWDERS/ BC'S, VITAMINS, HERBAL MEDICINES.   Monday ONLY TAKE METAGLIP MORNING DOSE WITH REGULAR MEDICINES. How to Manage Your Diabetes Before Surgery   Why is it important to control my blood sugar before and after surgery?   Improving blood sugar levels before and after surgery helps healing and can limit problems.  A way of improving blood sugar control is eating a healthy diet by:  - Eating less sugar and carbohydrates  - Increasing activity/exercise  - Talk with your doctor about reaching your blood sugar goals  High blood sugars (greater than 180 mg/dL) can raise your risk of infections and slow down your recovery so you will need to focus on controlling your diabetes during the weeks before surgery.  Make sure that the doctor who takes care of your diabetes knows about your planned surgery including the date and location.  How do I manage my blood sugars before surgery?   Check your blood sugar at  least 4 times a day, 2 days before surgery to make sure that they are not too high or low.  Check your blood sugar the morning of your surgery when you wake up and every 2 hours until you get to the Short-Stay unit.  Treat a low blood sugar (less than 70 mg/dL) with 1/2 cup of clear juice (cranberry or apple), 4 glucose tablets, OR glucose gel.  Recheck blood sugar in 15 minutes after treatment (to make sure it is greater than 70 mg/dL).  If blood sugar is not greater than 70 mg/dL on re-check, call [phone] for further instructions.   Report your blood sugar to the Short-Stay nurse when you get to Short-Stay.  References:  [facility], 2007 "How to Manage your Diabetes Before and After Surgery".  What do I do about my diabetes medications?   Do not take oral diabetes medicines (pills) the morning of surgery.   Do not wear jewelry, make-up or nail polish.  Do not wear lotions, powders, or perfumes.  You may wear deodorant.  Do not shave 48 hours prior to surgery.    Do not bring valuables to the hospital.  [facility] is not responsible for any belongings or valuables.  Contacts, dentures or bridgework may not be worn into surgery.  Leave your suitcase in the car.  After surgery it may be brought to your room.  For patients admitted to the hospital, discharge time will be determined by your treatment team.  Special instructions:  See "Preparing for Surgery"  Instruction sheet.  Please read over the following fact sheets that you were given. Pain Booklet, Coughing and Deep Breathing, Blood Transfusion Information and Surgical Site Infection Prevention

## 2015-11-20 ENCOUNTER — Encounter (HOSPITAL_COMMUNITY): Payer: Self-pay

## 2015-11-20 ENCOUNTER — Encounter (HOSPITAL_COMMUNITY)
Admission: RE | Admit: 2015-11-20 | Discharge: 2015-11-20 | Disposition: A | Discharge status: Home / Self Care | Payer: Medicare Other | Source: Ambulatory Visit | Attending: Specialist | Admitting: Specialist

## 2015-11-20 DIAGNOSIS — Z0183 Encounter for blood typing: Secondary | ICD-10-CM | POA: Diagnosis not present

## 2015-11-20 DIAGNOSIS — Z01812 Encounter for preprocedural laboratory examination: Secondary | ICD-10-CM | POA: Insufficient documentation

## 2015-11-20 DIAGNOSIS — Z79899 Other long term (current) drug therapy: Secondary | ICD-10-CM | POA: Diagnosis not present

## 2015-11-20 DIAGNOSIS — M4806 Spinal stenosis, lumbar region: Secondary | ICD-10-CM | POA: Insufficient documentation

## 2015-11-20 LAB — SURGICAL PCR SCREEN
MRSA, PCR: NEGATIVE
STAPHYLOCOCCUS AUREUS: POSITIVE — AB

## 2015-11-20 LAB — CBC
HCT: 33.6 % — ABNORMAL LOW (ref 36.0–46.0)
HEMOGLOBIN: 11.4 g/dL — AB (ref 12.0–15.0)
MCH: 31.2 pg (ref 26.0–34.0)
MCHC: 33.9 g/dL (ref 30.0–36.0)
MCV: 92.1 fL (ref 78.0–100.0)
Platelets: 239 10*3/uL (ref 150–400)
RBC: 3.65 MIL/uL — AB (ref 3.87–5.11)
RDW: 12.7 % (ref 11.5–15.5)
WBC: 7.4 10*3/uL (ref 4.0–10.5)

## 2015-11-20 LAB — COMPREHENSIVE METABOLIC PANEL
ALK PHOS: 71 U/L (ref 38–126)
ALT: 21 U/L (ref 14–54)
AST: 24 U/L (ref 15–41)
Albumin: 3.9 g/dL (ref 3.5–5.0)
Anion gap: 7 (ref 5–15)
BUN: 21 mg/dL — AB (ref 6–20)
CALCIUM: 9.2 mg/dL (ref 8.9–10.3)
CO2: 20 mmol/L — AB (ref 22–32)
CREATININE: 1.39 mg/dL — AB (ref 0.44–1.00)
Chloride: 113 mmol/L — ABNORMAL HIGH (ref 101–111)
GFR, EST AFRICAN AMERICAN: 42 mL/min — AB (ref >60)
GFR, EST NON AFRICAN AMERICAN: 36 mL/min — AB (ref >60)
Glucose, Bld: 81 mg/dL (ref 65–99)
Potassium: 5 mmol/L (ref 3.5–5.1)
Sodium: 140 mmol/L (ref 135–145)
Total Bilirubin: 0.3 mg/dL (ref 0.3–1.2)
Total Protein: 6.9 g/dL (ref 6.5–8.1)

## 2015-11-20 LAB — GLUCOSE, CAPILLARY: GLUCOSE-CAPILLARY: 78 mg/dL (ref 65–99)

## 2015-11-20 LAB — TYPE AND SCREEN
ABO/RH(D): A POS
ANTIBODY SCREEN: NEGATIVE

## 2015-11-25 MED ORDER — VANCOMYCIN HCL IN DEXTROSE 1-5 GM/200ML-% IV SOLN
1000.0000 mg | INTRAVENOUS | Status: AC
  Administered 2015-11-26: 1000 mg via INTRAVENOUS
  Filled 2015-11-25: qty 200

## 2015-11-25 MED ORDER — CHLORHEXIDINE GLUCONATE 4 % EX LIQD: 60.0000 mL | Freq: Once | CUTANEOUS | Status: DC

## 2015-11-26 ENCOUNTER — Inpatient Hospital Stay (HOSPITAL_COMMUNITY): Payer: Medicare Other

## 2015-11-26 ENCOUNTER — Encounter (HOSPITAL_COMMUNITY)
Admission: RE | Disposition: A | Discharge status: Home Health | Payer: Self-pay | Source: Ambulatory Visit | Attending: Specialist

## 2015-11-26 ENCOUNTER — Observation Stay (HOSPITAL_COMMUNITY)
Admission: RE | Admit: 2015-11-26 | Discharge: 2015-11-30 | Disposition: A | Discharge status: Home Health | Payer: Medicare Other | Source: Ambulatory Visit | Attending: Specialist | Admitting: Specialist

## 2015-11-26 ENCOUNTER — Inpatient Hospital Stay (HOSPITAL_COMMUNITY): Payer: Medicare Other | Admitting: Certified Registered Nurse Anesthetist

## 2015-11-26 ENCOUNTER — Encounter (HOSPITAL_COMMUNITY): Payer: Self-pay | Admitting: Surgery

## 2015-11-26 DIAGNOSIS — F329 Major depressive disorder, single episode, unspecified: Secondary | ICD-10-CM | POA: Insufficient documentation

## 2015-11-26 DIAGNOSIS — E1121 Type 2 diabetes mellitus with diabetic nephropathy: Secondary | ICD-10-CM

## 2015-11-26 DIAGNOSIS — Z7984 Long term (current) use of oral hypoglycemic drugs: Secondary | ICD-10-CM | POA: Diagnosis not present

## 2015-11-26 DIAGNOSIS — Z7982 Long term (current) use of aspirin: Secondary | ICD-10-CM | POA: Insufficient documentation

## 2015-11-26 DIAGNOSIS — Z419 Encounter for procedure for purposes other than remedying health state, unspecified: Secondary | ICD-10-CM

## 2015-11-26 DIAGNOSIS — N183 Chronic kidney disease, stage 3 (moderate): Secondary | ICD-10-CM | POA: Insufficient documentation

## 2015-11-26 DIAGNOSIS — M4806 Spinal stenosis, lumbar region: Principal | ICD-10-CM | POA: Insufficient documentation

## 2015-11-26 DIAGNOSIS — D649 Anemia, unspecified: Secondary | ICD-10-CM | POA: Diagnosis not present

## 2015-11-26 DIAGNOSIS — K589 Irritable bowel syndrome without diarrhea: Secondary | ICD-10-CM | POA: Diagnosis not present

## 2015-11-26 DIAGNOSIS — I2510 Atherosclerotic heart disease of native coronary artery without angina pectoris: Secondary | ICD-10-CM | POA: Diagnosis present

## 2015-11-26 DIAGNOSIS — D638 Anemia in other chronic diseases classified elsewhere: Secondary | ICD-10-CM | POA: Diagnosis present

## 2015-11-26 DIAGNOSIS — M5416 Radiculopathy, lumbar region: Secondary | ICD-10-CM | POA: Diagnosis not present

## 2015-11-26 DIAGNOSIS — Z96652 Presence of left artificial knee joint: Secondary | ICD-10-CM | POA: Diagnosis not present

## 2015-11-26 DIAGNOSIS — I4891 Unspecified atrial fibrillation: Secondary | ICD-10-CM | POA: Diagnosis present

## 2015-11-26 DIAGNOSIS — I959 Hypotension, unspecified: Secondary | ICD-10-CM | POA: Insufficient documentation

## 2015-11-26 DIAGNOSIS — R112 Nausea with vomiting, unspecified: Secondary | ICD-10-CM | POA: Insufficient documentation

## 2015-11-26 DIAGNOSIS — N184 Chronic kidney disease, stage 4 (severe): Secondary | ICD-10-CM

## 2015-11-26 DIAGNOSIS — I10 Essential (primary) hypertension: Secondary | ICD-10-CM | POA: Diagnosis present

## 2015-11-26 DIAGNOSIS — F331 Major depressive disorder, recurrent, moderate: Secondary | ICD-10-CM | POA: Diagnosis present

## 2015-11-26 DIAGNOSIS — I480 Paroxysmal atrial fibrillation: Secondary | ICD-10-CM | POA: Diagnosis not present

## 2015-11-26 DIAGNOSIS — K219 Gastro-esophageal reflux disease without esophagitis: Secondary | ICD-10-CM | POA: Insufficient documentation

## 2015-11-26 DIAGNOSIS — R509 Fever, unspecified: Secondary | ICD-10-CM | POA: Diagnosis not present

## 2015-11-26 DIAGNOSIS — Z85038 Personal history of other malignant neoplasm of large intestine: Secondary | ICD-10-CM | POA: Diagnosis not present

## 2015-11-26 DIAGNOSIS — Z9889 Other specified postprocedural states: Secondary | ICD-10-CM | POA: Diagnosis not present

## 2015-11-26 DIAGNOSIS — E785 Hyperlipidemia, unspecified: Secondary | ICD-10-CM | POA: Insufficient documentation

## 2015-11-26 DIAGNOSIS — E1169 Type 2 diabetes mellitus with other specified complication: Secondary | ICD-10-CM | POA: Diagnosis present

## 2015-11-26 DIAGNOSIS — E1122 Type 2 diabetes mellitus with diabetic chronic kidney disease: Secondary | ICD-10-CM | POA: Diagnosis not present

## 2015-11-26 DIAGNOSIS — Z955 Presence of coronary angioplasty implant and graft: Secondary | ICD-10-CM | POA: Insufficient documentation

## 2015-11-26 DIAGNOSIS — M4316 Spondylolisthesis, lumbar region: Secondary | ICD-10-CM | POA: Insufficient documentation

## 2015-11-26 DIAGNOSIS — I129 Hypertensive chronic kidney disease with stage 1 through stage 4 chronic kidney disease, or unspecified chronic kidney disease: Secondary | ICD-10-CM | POA: Insufficient documentation

## 2015-11-26 DIAGNOSIS — Z79899 Other long term (current) drug therapy: Secondary | ICD-10-CM | POA: Insufficient documentation

## 2015-11-26 DIAGNOSIS — M48062 Spinal stenosis, lumbar region with neurogenic claudication: Secondary | ICD-10-CM | POA: Diagnosis present

## 2015-11-26 HISTORY — PX: LUMBAR LAMINECTOMY: SHX95

## 2015-11-26 LAB — GLUCOSE, CAPILLARY
GLUCOSE-CAPILLARY: 79 mg/dL (ref 65–99)
Glucose-Capillary: 107 mg/dL — ABNORMAL HIGH (ref 65–99)

## 2015-11-26 SURGERY — MICRODISCECTOMY LUMBAR LAMINECTOMY
Anesthesia: General

## 2015-11-26 MED ORDER — POTASSIUM CHLORIDE IN NACL 20-0.9 MEQ/L-% IV SOLN
INTRAVENOUS | Status: DC
  Administered 2015-11-26 – 2015-11-29 (×2): via INTRAVENOUS
  Filled 2015-11-26 (×4): qty 1000

## 2015-11-26 MED ORDER — FENTANYL CITRATE (PF) 250 MCG/5ML IJ SOLN
INTRAMUSCULAR | Status: AC
  Filled 2015-11-26: qty 5

## 2015-11-26 MED ORDER — HYDROCHLOROTHIAZIDE 25 MG PO TABS
25.0000 mg | ORAL_TABLET | Freq: Every day | ORAL | Status: DC
  Administered 2015-11-27 – 2015-11-28 (×2): 25 mg via ORAL
  Filled 2015-11-26 (×2): qty 1

## 2015-11-26 MED ORDER — ARTIFICIAL TEARS OP OINT
TOPICAL_OINTMENT | OPHTHALMIC | Status: DC | PRN
  Administered 2015-11-26: 1 via OPHTHALMIC

## 2015-11-26 MED ORDER — ALBUMIN HUMAN 5 % IV SOLN
INTRAVENOUS | Status: DC | PRN
  Administered 2015-11-26: 16:00:00 via INTRAVENOUS

## 2015-11-26 MED ORDER — FENTANYL CITRATE (PF) 100 MCG/2ML IJ SOLN
INTRAMUSCULAR | Status: AC
  Filled 2015-11-26: qty 2

## 2015-11-26 MED ORDER — OXYCODONE-ACETAMINOPHEN 5-325 MG PO TABS
ORAL_TABLET | ORAL | Status: AC
  Filled 2015-11-26: qty 2

## 2015-11-26 MED ORDER — VANCOMYCIN HCL IN DEXTROSE 1-5 GM/200ML-% IV SOLN
1000.0000 mg | Freq: Once | INTRAVENOUS | Status: AC
  Administered 2015-11-27: 1000 mg via INTRAVENOUS
  Filled 2015-11-26 (×2): qty 200

## 2015-11-26 MED ORDER — FENTANYL CITRATE (PF) 250 MCG/5ML IJ SOLN
INTRAMUSCULAR | Status: DC | PRN
  Administered 2015-11-26: 75 ug via INTRAVENOUS
  Administered 2015-11-26 (×2): 25 ug via INTRAVENOUS
  Administered 2015-11-26: 50 ug via INTRAVENOUS

## 2015-11-26 MED ORDER — CITALOPRAM HYDROBROMIDE 40 MG PO TABS
40.0000 mg | ORAL_TABLET | Freq: Every day | ORAL | Status: DC
  Administered 2015-11-27 – 2015-11-30 (×4): 40 mg via ORAL
  Filled 2015-11-26 (×5): qty 1

## 2015-11-26 MED ORDER — HYDROCODONE-ACETAMINOPHEN 5-325 MG PO TABS
1.0000 | ORAL_TABLET | ORAL | Status: DC | PRN
  Administered 2015-11-26 – 2015-11-29 (×8): 2 via ORAL
  Filled 2015-11-26 (×8): qty 2

## 2015-11-26 MED ORDER — OXYCODONE-ACETAMINOPHEN 5-325 MG PO TABS
1.0000 | ORAL_TABLET | ORAL | Status: DC | PRN
  Administered 2015-11-26: 2 via ORAL

## 2015-11-26 MED ORDER — ONDANSETRON HCL 4 MG/2ML IJ SOLN
4.0000 mg | INTRAMUSCULAR | Status: DC | PRN
  Administered 2015-11-27 – 2015-11-28 (×3): 4 mg via INTRAVENOUS
  Filled 2015-11-26 (×3): qty 2

## 2015-11-26 MED ORDER — BISACODYL 5 MG PO TBEC: 5.0000 mg | DELAYED_RELEASE_TABLET | Freq: Every day | ORAL | Status: DC | PRN

## 2015-11-26 MED ORDER — SODIUM CHLORIDE 0.9% FLUSH
3.0000 mL | Freq: Two times a day (BID) | INTRAVENOUS | Status: DC
  Administered 2015-11-26 – 2015-11-30 (×5): 3 mL via INTRAVENOUS

## 2015-11-26 MED ORDER — SODIUM CHLORIDE 0.9% FLUSH: 3.0000 mL | INTRAVENOUS | Status: DC | PRN

## 2015-11-26 MED ORDER — POLYETHYLENE GLYCOL 3350 17 G PO PACK
17.0000 g | PACK | Freq: Every day | ORAL | Status: DC | PRN
  Administered 2015-11-29: 17 g via ORAL
  Filled 2015-11-26: qty 1

## 2015-11-26 MED ORDER — ONDANSETRON HCL 4 MG/2ML IJ SOLN
INTRAMUSCULAR | Status: AC
  Filled 2015-11-26: qty 2

## 2015-11-26 MED ORDER — DOCUSATE SODIUM 100 MG PO CAPS
100.0000 mg | ORAL_CAPSULE | Freq: Two times a day (BID) | ORAL | Status: DC
  Administered 2015-11-27 – 2015-11-30 (×6): 100 mg via ORAL
  Filled 2015-11-26 (×8): qty 1

## 2015-11-26 MED ORDER — CALCIUM CARBONATE-VITAMIN D 500-200 MG-UNIT PO TABS
1.0000 | ORAL_TABLET | Freq: Two times a day (BID) | ORAL | Status: DC
  Administered 2015-11-26 – 2015-11-30 (×8): 1 via ORAL
  Filled 2015-11-26 (×8): qty 1

## 2015-11-26 MED ORDER — FLEET ENEMA 7-19 GM/118ML RE ENEM: 1.0000 | ENEMA | Freq: Once | RECTAL | Status: DC | PRN

## 2015-11-26 MED ORDER — DILTIAZEM HCL ER COATED BEADS 240 MG PO CP24
240.0000 mg | ORAL_CAPSULE | Freq: Every day | ORAL | Status: DC
  Administered 2015-11-27 – 2015-11-30 (×4): 240 mg via ORAL
  Filled 2015-11-26 (×4): qty 1

## 2015-11-26 MED ORDER — LIDOCAINE HCL (CARDIAC) 20 MG/ML IV SOLN
INTRAVENOUS | Status: AC
  Filled 2015-11-26: qty 5

## 2015-11-26 MED ORDER — PHENOL 1.4 % MT LIQD: 1.0000 | OROMUCOSAL | Status: DC | PRN

## 2015-11-26 MED ORDER — ACETAMINOPHEN 325 MG PO TABS: 650.0000 mg | ORAL_TABLET | Freq: Four times a day (QID) | ORAL | Status: DC | PRN

## 2015-11-26 MED ORDER — GLYCOPYRROLATE 0.2 MG/ML IJ SOLN
INTRAMUSCULAR | Status: DC | PRN
  Administered 2015-11-26: 0.3 mg via INTRAVENOUS
  Administered 2015-11-26: 0.2 mg via INTRAVENOUS

## 2015-11-26 MED ORDER — EPHEDRINE SULFATE 50 MG/ML IJ SOLN
INTRAMUSCULAR | Status: AC
  Filled 2015-11-26: qty 1

## 2015-11-26 MED ORDER — ATORVASTATIN CALCIUM 20 MG PO TABS
20.0000 mg | ORAL_TABLET | Freq: Every day | ORAL | Status: DC
  Administered 2015-11-27 – 2015-11-28 (×2): 20 mg via ORAL
  Filled 2015-11-26 (×4): qty 1

## 2015-11-26 MED ORDER — LOSARTAN POTASSIUM-HCTZ 100-25 MG PO TABS: 1.0000 | ORAL_TABLET | Freq: Every day | ORAL | Status: DC

## 2015-11-26 MED ORDER — CARVEDILOL 12.5 MG PO TABS
12.5000 mg | ORAL_TABLET | Freq: Two times a day (BID) | ORAL | Status: DC
  Administered 2015-11-27 – 2015-11-30 (×6): 12.5 mg via ORAL
  Filled 2015-11-26 (×6): qty 1

## 2015-11-26 MED ORDER — FERROUS SULFATE 325 (65 FE) MG PO TABS
325.0000 mg | ORAL_TABLET | Freq: Two times a day (BID) | ORAL | Status: DC
  Administered 2015-11-27 – 2015-11-30 (×7): 325 mg via ORAL
  Filled 2015-11-26 (×8): qty 1

## 2015-11-26 MED ORDER — PHENYLEPHRINE HCL 10 MG/ML IJ SOLN
10.0000 mg | INTRAMUSCULAR | Status: DC | PRN
  Administered 2015-11-26: 20 ug/min via INTRAVENOUS

## 2015-11-26 MED ORDER — BUPIVACAINE HCL (PF) 0.5 % IJ SOLN
INTRAMUSCULAR | Status: AC
  Filled 2015-11-26: qty 30

## 2015-11-26 MED ORDER — ALUM & MAG HYDROXIDE-SIMETH 200-200-20 MG/5ML PO SUSP: 30.0000 mL | Freq: Four times a day (QID) | ORAL | Status: DC | PRN

## 2015-11-26 MED ORDER — CHOLESTYRAMINE LIGHT 4 G PO PACK
4.0000 g | PACK | Freq: Two times a day (BID) | ORAL | Status: DC
  Administered 2015-11-26 – 2015-11-28 (×4): 4 g via ORAL
  Filled 2015-11-26 (×10): qty 1

## 2015-11-26 MED ORDER — ARTIFICIAL TEARS OP OINT
TOPICAL_OINTMENT | OPHTHALMIC | Status: AC
  Filled 2015-11-26: qty 3.5

## 2015-11-26 MED ORDER — BUPIVACAINE HCL 0.5 % IJ SOLN
INTRAMUSCULAR | Status: DC | PRN
  Administered 2015-11-26: 10 mL

## 2015-11-26 MED ORDER — LOSARTAN POTASSIUM 50 MG PO TABS
100.0000 mg | ORAL_TABLET | Freq: Every day | ORAL | Status: DC
  Administered 2015-11-27 – 2015-11-28 (×2): 100 mg via ORAL
  Filled 2015-11-26 (×3): qty 2

## 2015-11-26 MED ORDER — LORATADINE 10 MG PO TABS
10.0000 mg | ORAL_TABLET | Freq: Every day | ORAL | Status: DC
  Administered 2015-11-27 – 2015-11-30 (×2): 10 mg via ORAL
  Filled 2015-11-26 (×4): qty 1

## 2015-11-26 MED ORDER — SODIUM CHLORIDE 0.9 % IJ SOLN
INTRAMUSCULAR | Status: AC
  Filled 2015-11-26: qty 10

## 2015-11-26 MED ORDER — ROCURONIUM BROMIDE 100 MG/10ML IV SOLN
INTRAVENOUS | Status: DC | PRN
  Administered 2015-11-26: 10 mg via INTRAVENOUS
  Administered 2015-11-26: 40 mg via INTRAVENOUS
  Administered 2015-11-26 (×2): 10 mg via INTRAVENOUS

## 2015-11-26 MED ORDER — THROMBIN 20000 UNITS EX KIT
PACK | CUTANEOUS | Status: DC | PRN
  Administered 2015-11-26: 15:00:00 via TOPICAL

## 2015-11-26 MED ORDER — NYSTATIN 100000 UNIT/GM EX CREA
1.0000 "application " | TOPICAL_CREAM | Freq: Two times a day (BID) | CUTANEOUS | Status: DC
  Administered 2015-11-27 – 2015-11-30 (×4): 1 via TOPICAL
  Filled 2015-11-26: qty 15

## 2015-11-26 MED ORDER — 0.9 % SODIUM CHLORIDE (POUR BTL) OPTIME
TOPICAL | Status: DC | PRN
Start: 2015-11-26 — End: 2015-11-26
  Administered 2015-11-26: 1000 mL

## 2015-11-26 MED ORDER — METHOCARBAMOL 500 MG PO TABS
500.0000 mg | ORAL_TABLET | Freq: Four times a day (QID) | ORAL | Status: DC | PRN
  Administered 2015-11-27 – 2015-11-30 (×2): 500 mg via ORAL
  Filled 2015-11-26 (×2): qty 1

## 2015-11-26 MED ORDER — ONDANSETRON HCL 4 MG/2ML IJ SOLN
INTRAMUSCULAR | Status: DC | PRN
  Administered 2015-11-26: 4 mg via INTRAVENOUS

## 2015-11-26 MED ORDER — ASPIRIN 81 MG PO CHEW
81.0000 mg | CHEWABLE_TABLET | Freq: Every day | ORAL | Status: DC
  Administered 2015-11-27 – 2015-11-28 (×2): 81 mg via ORAL
  Filled 2015-11-26 (×3): qty 1

## 2015-11-26 MED ORDER — THROMBIN 20000 UNITS EX SOLR
CUTANEOUS | Status: AC
  Filled 2015-11-26: qty 20000

## 2015-11-26 MED ORDER — CHLORHEXIDINE GLUCONATE 4 % EX LIQD: 60.0000 mL | Freq: Once | CUTANEOUS | Status: DC

## 2015-11-26 MED ORDER — PROPOFOL 10 MG/ML IV BOLUS
INTRAVENOUS | Status: DC | PRN
  Administered 2015-11-26: 100 mg via INTRAVENOUS

## 2015-11-26 MED ORDER — GLYCOPYRROLATE 0.2 MG/ML IJ SOLN
INTRAMUSCULAR | Status: AC
  Filled 2015-11-26: qty 1

## 2015-11-26 MED ORDER — METHOCARBAMOL 1000 MG/10ML IJ SOLN
500.0000 mg | Freq: Four times a day (QID) | INTRAVENOUS | Status: DC | PRN
  Filled 2015-11-26: qty 5

## 2015-11-26 MED ORDER — SPIRONOLACTONE 25 MG PO TABS
25.0000 mg | ORAL_TABLET | Freq: Every day | ORAL | Status: DC
  Administered 2015-11-27 – 2015-11-28 (×2): 25 mg via ORAL
  Filled 2015-11-26 (×6): qty 1

## 2015-11-26 MED ORDER — BUPIVACAINE LIPOSOME 1.3 % IJ SUSP
INTRAMUSCULAR | Status: DC | PRN
  Administered 2015-11-26: 20 mL

## 2015-11-26 MED ORDER — MORPHINE SULFATE (PF) 2 MG/ML IV SOLN: 1.0000 mg | INTRAVENOUS | Status: DC | PRN

## 2015-11-26 MED ORDER — LIDOCAINE HCL (CARDIAC) 20 MG/ML IV SOLN
INTRAVENOUS | Status: DC | PRN
  Administered 2015-11-26: 100 mg via INTRAVENOUS

## 2015-11-26 MED ORDER — ONDANSETRON HCL 4 MG/2ML IJ SOLN: 4.0000 mg | Freq: Once | INTRAMUSCULAR | Status: DC | PRN

## 2015-11-26 MED ORDER — NEOSTIGMINE METHYLSULFATE 10 MG/10ML IV SOLN
INTRAVENOUS | Status: AC
  Filled 2015-11-26: qty 1

## 2015-11-26 MED ORDER — INSULIN ASPART 100 UNIT/ML ~~LOC~~ SOLN
0.0000 [IU] | Freq: Three times a day (TID) | SUBCUTANEOUS | Status: DC
  Administered 2015-11-28: 2 [IU] via SUBCUTANEOUS
  Administered 2015-11-29 (×3): 1 [IU] via SUBCUTANEOUS

## 2015-11-26 MED ORDER — LACTATED RINGERS IV SOLN
INTRAVENOUS | Status: DC
  Administered 2015-11-26 (×2): via INTRAVENOUS

## 2015-11-26 MED ORDER — MENTHOL 3 MG MT LOZG: 1.0000 | LOZENGE | OROMUCOSAL | Status: DC | PRN

## 2015-11-26 MED ORDER — FENTANYL CITRATE (PF) 100 MCG/2ML IJ SOLN
25.0000 ug | INTRAMUSCULAR | Status: DC | PRN
  Administered 2015-11-26: 25 ug via INTRAVENOUS

## 2015-11-26 MED ORDER — SODIUM CHLORIDE 0.9 % IV SOLN: 250.0000 mL | INTRAVENOUS | Status: DC

## 2015-11-26 MED ORDER — PANTOPRAZOLE SODIUM 40 MG PO TBEC
80.0000 mg | DELAYED_RELEASE_TABLET | Freq: Every day | ORAL | Status: DC
  Administered 2015-11-27 – 2015-11-30 (×4): 80 mg via ORAL
  Filled 2015-11-26 (×4): qty 2

## 2015-11-26 MED ORDER — BUPIVACAINE LIPOSOME 1.3 % IJ SUSP
20.0000 mL | INTRAMUSCULAR | Status: DC
  Filled 2015-11-26: qty 20

## 2015-11-26 MED ORDER — SUCCINYLCHOLINE CHLORIDE 20 MG/ML IJ SOLN
INTRAMUSCULAR | Status: AC
  Filled 2015-11-26: qty 1

## 2015-11-26 MED ORDER — MIDAZOLAM HCL 2 MG/2ML IJ SOLN
INTRAMUSCULAR | Status: AC
  Filled 2015-11-26: qty 2

## 2015-11-26 MED ORDER — NEOSTIGMINE METHYLSULFATE 10 MG/10ML IV SOLN
INTRAVENOUS | Status: DC | PRN
  Administered 2015-11-26: 4 mg via INTRAVENOUS

## 2015-11-26 MED ORDER — ROCURONIUM BROMIDE 50 MG/5ML IV SOLN
INTRAVENOUS | Status: AC
  Filled 2015-11-26: qty 2

## 2015-11-26 SURGICAL SUPPLY — 59 items
ADH SKN CLS APL DERMABOND .7 (GAUZE/BANDAGES/DRESSINGS) ×1
BUR ROUND FLUTED 4 SOFT TCH (BURR) ×1 IMPLANT
BUR ROUND FLUTED 4MM SOFT TCH (BURR) ×1
BUR SABER RD CUTTING 3.0 (BURR) ×1 IMPLANT
BUR SABER RD CUTTING 3.0MM (BURR)
CANISTER SUCTION 2500CC (MISCELLANEOUS) ×3 IMPLANT
COVER MAYO STAND STRL (DRAPES) ×3 IMPLANT
COVER SURGICAL LIGHT HANDLE (MISCELLANEOUS) ×3 IMPLANT
DERMABOND ADVANCED (GAUZE/BANDAGES/DRESSINGS) ×2
DERMABOND ADVANCED .7 DNX12 (GAUZE/BANDAGES/DRESSINGS) ×1 IMPLANT
DRAPE C-ARM 42X72 X-RAY (DRAPES) IMPLANT
DRAPE MICROSCOPE LEICA (MISCELLANEOUS) ×3 IMPLANT
DRAPE PROXIMA HALF (DRAPES) IMPLANT
DRAPE SURG 17X23 STRL (DRAPES) ×12 IMPLANT
DRSG MEPILEX BORDER 4X4 (GAUZE/BANDAGES/DRESSINGS) IMPLANT
DRSG MEPILEX BORDER 4X8 (GAUZE/BANDAGES/DRESSINGS) ×2 IMPLANT
DURAPREP 26ML APPLICATOR (WOUND CARE) ×3 IMPLANT
ELECT BLADE 4.0 EZ CLEAN MEGAD (MISCELLANEOUS) ×3
ELECT CAUTERY BLADE 6.4 (BLADE) ×3 IMPLANT
ELECT REM PT RETURN 9FT ADLT (ELECTROSURGICAL) ×3
ELECTRODE BLDE 4.0 EZ CLN MEGD (MISCELLANEOUS) ×1 IMPLANT
ELECTRODE REM PT RTRN 9FT ADLT (ELECTROSURGICAL) ×1 IMPLANT
GLOVE BIOGEL PI IND STRL 6.5 (GLOVE) IMPLANT
GLOVE BIOGEL PI IND STRL 8 (GLOVE) ×1 IMPLANT
GLOVE BIOGEL PI INDICATOR 6.5 (GLOVE) ×2
GLOVE BIOGEL PI INDICATOR 8 (GLOVE) ×2
GLOVE ECLIPSE 6.5 STRL STRAW (GLOVE) ×2 IMPLANT
GLOVE ECLIPSE 8.5 STRL (GLOVE) ×3 IMPLANT
GLOVE ORTHO TXT STRL SZ7.5 (GLOVE) ×3 IMPLANT
GLOVE SURG 8.5 LATEX PF (GLOVE) ×3 IMPLANT
GOWN STRL REUS W/ TWL LRG LVL3 (GOWN DISPOSABLE) ×1 IMPLANT
GOWN STRL REUS W/TWL 2XL LVL3 (GOWN DISPOSABLE) ×6 IMPLANT
GOWN STRL REUS W/TWL LRG LVL3 (GOWN DISPOSABLE) ×3
KIT BASIN OR (CUSTOM PROCEDURE TRAY) ×3 IMPLANT
KIT ROOM TURNOVER OR (KITS) ×3 IMPLANT
NDL SPNL 18GX3.5 QUINCKE PK (NEEDLE) ×2 IMPLANT
NEEDLE SPNL 18GX3.5 QUINCKE PK (NEEDLE) ×6 IMPLANT
NS IRRIG 1000ML POUR BTL (IV SOLUTION) ×3 IMPLANT
PACK LAMINECTOMY ORTHO (CUSTOM PROCEDURE TRAY) ×3 IMPLANT
PAD ARMBOARD 7.5X6 YLW CONV (MISCELLANEOUS) ×8 IMPLANT
PATTIES SURGICAL .5 X.5 (GAUZE/BANDAGES/DRESSINGS) ×2 IMPLANT
PATTIES SURGICAL .75X.75 (GAUZE/BANDAGES/DRESSINGS) IMPLANT
SPONGE LAP 4X18 X RAY DECT (DISPOSABLE) IMPLANT
SPONGE SURGIFOAM ABS GEL 100 (HEMOSTASIS) ×3 IMPLANT
SUT VIC AB 0 CT1 27 (SUTURE) ×3
SUT VIC AB 0 CT1 27XBRD ANBCTR (SUTURE) IMPLANT
SUT VIC AB 1 CTX 36 (SUTURE) ×3
SUT VIC AB 1 CTX36XBRD ANBCTR (SUTURE) ×1 IMPLANT
SUT VIC AB 2-0 CT1 27 (SUTURE) ×3
SUT VIC AB 2-0 CT1 TAPERPNT 27 (SUTURE) ×1 IMPLANT
SUT VIC AB 3-0 X1 27 (SUTURE) ×3 IMPLANT
SUT VICRYL 0 UR6 27IN ABS (SUTURE) ×3 IMPLANT
SYR 20CC LL (SYRINGE) ×3 IMPLANT
SYR CONTROL 10ML LL (SYRINGE) ×3 IMPLANT
TOWEL OR 17X24 6PK STRL BLUE (TOWEL DISPOSABLE) ×3 IMPLANT
TOWEL OR 17X26 10 PK STRL BLUE (TOWEL DISPOSABLE) ×3 IMPLANT
TRAY FOLEY CATH 16FRSI W/METER (SET/KITS/TRAYS/PACK) ×2 IMPLANT
WATER STERILE IRR 1000ML POUR (IV SOLUTION) ×3 IMPLANT
YANKAUER SUCT BULB TIP NO VENT (SUCTIONS) ×2 IMPLANT

## 2015-11-26 NOTE — Discharge Instructions (Addendum)
° ° °  No lifting greater than 10 lbs. Avoid bending, stooping and twisting. Walk in house for first week them may start to get out slowly increasing distance up to one half block by 3 weeks post op. Keep incision dry for 3 days, may use tegaderm or similar water impervious dressing.  Need to check blood pressures daily and if systolic BP is less than [id number]  Hold on hyzaar and spirolactone. Be sure to contact your primary care physician and see her in 1-2 weeks for anemia, blood pressure medication adjustment and for diabetes.

## 2015-11-26 NOTE — Anesthesia Preprocedure Evaluation (Addendum)
Anesthesia Evaluation  Patient identified by MRN, date of birth, ID band Patient awake    Reviewed: Allergy & Precautions, NPO status , Patient's Chart, lab work & pertinent test results, reviewed documented beta blocker date and time   Airway Mallampati: I  TM Distance: >3 FB Neck ROM: Full    Dental  (+) Dental Advisory Given, Edentulous Upper, Upper Dentures, Missing   Pulmonary neg pulmonary ROS,    Pulmonary exam normal breath sounds clear to auscultation       Cardiovascular hypertension, Pt. on home beta blockers and Pt. on medications (-) angina+ CAD and + Cardiac Stents (s/p BMS to LAD)  (-) Past MI Normal cardiovascular exam+ dysrhythmias Atrial Fibrillation  Rhythm:Regular Rate:Normal     Neuro/Psych  Headaches, PSYCHIATRIC DISORDERS Anxiety Depression  Neuromuscular disease    GI/Hepatic Neg liver ROS, GERD  Medicated,  Endo/Other  diabetes, Type 2, Oral Hypoglycemic Agents  Renal/GU Renal InsufficiencyRenal disease     Musculoskeletal  (+) Arthritis , Osteoarthritis,    Abdominal   Peds  Hematology  (+) Blood dyscrasia, anemia ,   Anesthesia Other Findings Day of surgery medications reviewed with the patient.  Reproductive/Obstetrics                         Anesthesia Physical Anesthesia Plan  ASA: III  Anesthesia Plan: General   Post-op Pain Management:    Induction: Intravenous  Airway Management Planned: Oral ETT  Additional Equipment:   Intra-op Plan:   Post-operative Plan: Extubation in OR  Informed Consent: I have reviewed the patients History and Physical, chart, labs and discussed the procedure including the risks, benefits and alternatives for the proposed anesthesia with the patient or authorized representative who has indicated his/her understanding and acceptance.   Dental advisory given  Plan Discussed with: CRNA  Anesthesia Plan Comments:  (Risks/benefits of general anesthesia discussed with patient including risk of damage to teeth, lips, gum, and tongue, nausea/vomiting, allergic reactions to medications, and the possibility of heart attack, stroke and death.  All patient questions answered.  Patient wishes to proceed.)        Anesthesia Quick Evaluation

## 2015-11-26 NOTE — Transfer of Care (Signed)
Immediate Anesthesia Transfer of Care Note  Patient: Tonya [name]  Procedure(s) Performed: Procedure(s): LUMBAR DECOMPRESSIVE LAMINECTOMY L3-4 AND L4-5, LEFT L2-3 HEMILAMINECTOMY (N/A)  Patient Location: PACU  Anesthesia Type:General  Level of Consciousness: lethargic and responds to stimulation  Airway & Oxygen Therapy: Patient Spontanous Breathing and Patient connected to nasal cannula oxygen  Post-op Assessment: Report given to RN and Patient moving all extremities X 4  Post vital signs: Reviewed and stable  Last Vitals:  Filed Vitals:   11/26/15 1028  BP: 163/54  Pulse: 59  Temp: 0000000 C    Complications: No apparent anesthesia complications

## 2015-11-26 NOTE — Interval H&P Note (Signed)
The patient has been re-examined, and the chart reviewed, and there have been no interval changes to the documented history and physical.    The risks, benefits, and alternatives have been discussed at length, and the patient is willing to proceed.   

## 2015-11-26 NOTE — Progress Notes (Signed)
Pharmacy Antibiotic Note  Tonya [name] is a 75 y.o. female admitted on 11/26/2015 with back surgery.  Pharmacy has been consulted for vancomycin dosing for surgical prophylaxis.  Vanc 1 gm given preop at 1339.  Wt 72 kg, creat cl ~ 36 ml/min. No drain in place.   Plan: Vancomycin 1 gm x 1 dose 3/1 at 0600 to complete prophylaxis   Height: 5\' 5"  (165.1 cm) Weight: 159 lb (72.122 kg) IBW/kg (Calculated) : 57  Temp (24hrs), Avg:97.8 F (36.6 C), Min:97.2 F (36.2 C), Max:98.4 F (36.9 C)   Recent Labs Lab 11/20/15 0929  WBC 7.4  CREATININE 1.39*    Estimated Creatinine Clearance: 34.8 mL/min (by C-G formula based on Cr of 1.39).    Allergies  Allergen Reactions  . Amoxicillin-Pot Clavulanate Swelling    Augmentin per patient, tongue swelling    Thank you for allowing pharmacy to be a part of this patient's care.  Eudelia Bunch, Pharm.D. BP:[phone] 11/26/2015 4:43 PM

## 2015-11-26 NOTE — H&P (Signed)
Tonya [name] is an 75 y.o. female.    CHIEF COMPLAINT:  The patient is a 75 year old female who presents today with complaints of increasing back pain with radiation into her left lower extremity with standing and ambulation.   HISTORY OF PRESENT ILLNESS:  The patient is a 75 year old female who was seen previously with complaints of increasing discomfort with prolonged standing and prolonged ambulation.  She notes that with just walking from her home, up several houses, she begins experiencing pain into her back with some radiation down the left lower extremity.  She underwent an evaluation in September and had findings consistent with lumbar spinal stenosis.  She has had attempts at conservative management and has undergone epidural steroid injections.  They only help for about 2 or 3 weeks and then her pain seems to recur after initial decrease in discomfort.  She has been seen by Dr. Ernestina Patches last summer and had a series of 3 epidural steroid injections, again with pain relief that was initially quite good with the first injection, the second only lasting about a week, and then the last injection with no relief at all.  The pain is mainly into her back and left buttock, but radiates down the left lower extremity with feelings of weakness the longer she is standing and the further that she ambulates.  She is unable to walk a mile.  She relates that with grocery shopping, she is leaning on carts in order to stay upright.  She does use the electric buggy when she is at the grocery store.  She has been undergoing evaluation since our initial indication that she was a surgical candidate.  She had been seen by Dr. Percival Spanish of cardiology and evaluated and felt to be an acceptable risk for a procedure that was initially recommended, which was an L4-5 and L3-4 laminectomy for decompression of spinal stenosis.  She has received approval for recommended surgery.  It is, however, now almost 10 months since her MRI scan  that was done on January 25, 2015, and her pain complaints, although the same, with the planned surgery for the next 2 or 3 weeks, suggests that we should repeat her study to be sure that we are addressing the pathology concern.   ALLERGIES:  AUGMENTIN.    Past Medical History  Diagnosis Date  . Osteoarthritis   . Edema, peripheral   . CAD (coronary artery disease)     s/p BMS to LAD  . Breast pain     left  . Hyperlipidemia   . Hypertension   . Adenocarcinoma, colon ([place])   . Sciatica   . Chronic diarrhea   . GERD (gastroesophageal reflux disease)   . Depression   . Anxiety   . Osteoarthritis   . IBS (irritable bowel syndrome)   . Degenerative disk disease   . Carpal tunnel syndrome   . Diabetes mellitus, type 2 ([place])   . History of pneumonia   . Nocturia   . Fall at home 03/16/2014    no fx; multiple facial lacerations, abrasions and hematoma  . Dysrhythmia     h/o AFIB  . S/P coronary artery stent placement 2007  . Anemia     iron deficiency  . Colon polyps     Tubular adenoma  . Headache     Past Surgical History  Procedure Laterality Date  . Knee arthroscopy      bilateral  . Cholecystectomy    . Cardiac catheterization  12/10  LAD stent patent. Insignificant CAD, otherwise EF 60-65%  . Partial colectomy  11/2008    right, for adenocarcinoma  . Replacement total knee  [id number]    right, complicated by hemarthrosis   . Hematoma evacuation      after knee replacement  . Total knee revision  03/08/2012    Procedure: TOTAL KNEE REVISION;  Surgeon: Garald Balding, MD;  Location: [place];  Service: Orthopedics;  Laterality: Right;  removal total knee hardware and placement of antibiotic cement spacer and antibiotic beads  . Carpal tunnel release    . Total knee revision  05/17/2012    Procedure: TOTAL KNEE REVISION;  Surgeon: Garald Balding, MD;  Location: [place];  Service: Orthopedics;  Laterality: Right;  right total knee revision, removal of antibiotic spacer   . Total knee arthroplasty Left 04/03/2014    dr whitfield  . Total knee arthroplasty Left 04/03/2014    Procedure: TOTAL KNEE ARTHROPLASTY;  Surgeon: Garald Balding, MD;  Location: [place];  Service: Orthopedics;  Laterality: Left;  . Cataract extraction  03/2015  . Dilation and curettage of uterus    . Tonsillectomy      Family History  Problem Relation Age of Onset  . Heart disease Mother     massive MI age 85  . Heart attack Mother   . Heart disease Father     Massive MI age 76  . Prostate cancer Father   . Colon cancer Neg Hx    Social History:  reports that she has never smoked. She has never used smokeless tobacco. She reports that she does not drink alcohol or use illicit drugs.  Allergies:  Allergies  Allergen Reactions  . Amoxicillin-Pot Clavulanate Swelling    Augmentin per patient, tongue swelling    No prescriptions prior to admission    No results found for this or any previous visit (from the past 48 hour(s)). No results found.  Review of Systems  Constitutional: Negative.   HENT: Negative.   Eyes: Negative.   Respiratory: Negative.   Cardiovascular: Negative.   Gastrointestinal: Negative.   Genitourinary: Negative.   Musculoskeletal: Positive for back pain.  Skin: Negative.   Psychiatric/Behavioral: Negative.     There were no vitals taken for this visit. Physical Exam  Constitutional: She is oriented to person, place, and time. No distress.  HENT:  Head: Atraumatic.  Eyes: EOM are normal.  Neck: Normal range of motion.  Cardiovascular: Normal rate.   Respiratory: No respiratory distress.  GI: She exhibits no distension.  Musculoskeletal: She exhibits tenderness.  Neurological: She is alert and oriented to person, place, and time.  Skin: Skin is warm and dry.  Psychiatric: She has a normal mood and affect.    PHYSICAL EXAMINATION:  She has some minimal weakness in left knee extension, which is 4+/5.  She has hip flexion strength of 5/5  bilaterally.  Hip abduction and hip adduction appear intact.  Foot dorsiflexion and plantar flexion strength are normal.  Sciatic stretch signs are unremarkable.   RADIOGRAPHS:  Her MRI scan shows that she has severe lumbar spinal stenosis, multifactorial at the L4-5 level, with a minimal 1 or 2 mm anterolisthesis at this segment.  Severe facet arthrosis changes at the L4-5 level with some very minor subluxation of the right-sided facet causing impingement on the lateral recesses and triangulation of the thecal sac with the central portions of the thecal sac being nearly divided into 2 regions, a ventral and dorsal area  due to the facet joints approximating one another in the midline.  At L3-4, she has moderate degenerative changes with triangulation of the thecal sac, crowding of the nerve roots, and subarticular and central stenosis that is moderate degree.  At the L2-3 level, she has left-sided greater than right-sided subarticular lateral recess stenosis with ligamentum flavum hypertrophy, left side greater than right.  The findings on the left side appear to be mild to moderate; on the opposite right side subarticular area, mild.  As her symptoms are primarily left-sided in her buttock, thigh and leg, I think we probably should include the left L2-3 subarticular lateral recess in decompression as well as the L3-4 and L4-5 levels.   ASSESSMENT:  Lumbar spinal stenosis with neurogenic claudication.  Her worst area of stenosis is at L4-5 with a minimal anterolisthesis that does not change with flexion and extension radiographs obtained at her appointment in October.  She has moderate changes of stenosis at L3-4 and moderate along the left side at L2-3 subarticular lateral recess stenosis.   PLAN:  My plan is to consider a central laminectomy at L4-5 and a bilateral lateral recess decompression at L3-4 and a likely left-sided lateral recess decompression at L2-3.  I recommend that we repeat her MRI study, as  it has been nearly 10 months since her last study.  This should be done in regard to examining current pathology of her lumbar spine so that it is addressed appropriately.  The risks of surgery including the risks of infection, bleeding, and neurologic compromise were discussed with Tonya [name].  She does have diabetes and takes oral antihyperglycemic medicines, metformin and glipizide, so that she has a slightly elevated risk of infection due to this at about 1 in 100 to 1 in 200 versus 1 in 300 in the normal population.  The risk to the nerves themselves is also slightly elevated with diabetes, as the microvasculature is impaired in patients having diabetes.  The risk of bleeding requiring a transfusion is less than 1%.  I will go ahead and ask our scheduler to see if we can obtain permission for a left-sided lateral recess decompression at the L2-3 level in addition to her current planned surgery levels at L3-4 and L4-5.  Also would recommend for an MRI before we proceed with surgery, as her last MRI was 10 months ago.  Will call her with the results of the MRI study, but tentatively I would recommend that we go ahead and schedule her for a left-sided L2-3 hemilaminectomy with lateral recess decompression and bilateral hemilaminectomies and semi-hemilaminectomies at the L3-4 level with decompression of the lateral recess here, and then at L4-5, a central laminectomy with decompression of both lateral recess and central portions of the canal at this level.  The risks were discussed with the patient and she understands them.   Lanae Crumbly, PA-C 11/26/2015, 7:12 AM   Patient examined and lab reviewed with Ricard Dillon, PA-C.

## 2015-11-26 NOTE — Op Note (Addendum)
11/26/2015  4:32 PM  PATIENT:  Tonya [name]  75 y.o. female  MRN: [id number]  OPERATIVE REPORT  PRE-OPERATIVE DIAGNOSIS:  L2-3 lateral recess stenosis, lumbar spinal stenosis L3-4 and L4-5, spondylolisthesis L4-5  POST-OPERATIVE DIAGNOSIS:  L2-3 left lateral recess stenosis, lumbar spinal stenosis L3-4 and L4-5, Spondylolisthesis L4-5.  PROCEDURE:  Procedure(s): LUMBAR DECOMPRESSIVE SEMI HEMILAMINECTOMIES L3-4 AND L4-5, LEFT L2-3 SEM HEMILAMINECTOMY    SURGEON:  Jessy Oto, MD     ASSISTANT:  Benjiman Core, PA-C  (Present throughout the entire procedure and necessary for completion of procedure in a timely manner)     ANESTHESIA:  General,supplemented with local anesthetic marcaine 0.5% 1:1 exparel 1.3% total 30cc, Dr. Gifford Shave.    COMPLICATIONS:  None.  EBL: 150cc  DRAINS: Foley to SD.     PROCEDURE:The patient was met in the holding area, and the appropriate left Lumbar level L2-3,and bilateral L3-4 and L4-5 identified and marked with "x" and my initials.The patient was then transported to OR and was placed under general anesthesia without difficulty. The patient received appropriate preoperative antibiotic prophylaxis. Foley catheter was placed sterilely. The patient after intubation atraumatically was transferred to the operating room table, prone position, Wilson frame, sliding OR table. All pressure points were well padded. The arms in 90-90 well-padded at the elbows. Standard prep with DuraPrep solution lower dorsal spine to the mid sacral segment. Draped in the usual manner iodine Vi-Drape was used. Time-out procedure was called and correct. Skin infiltrated with Marcaine half percent 1:1 with exparel 1.3% total of 15 cc used. An incision approximately an inch inch and a half in length was then made through skin and subcutaneous layers in line with the right side of the expected midline just superior to the spinal needle entry point. An incision made into the bilateral lumbosacral  fascia approximately an inch in length .  Cobb elevators were then introduced into the incision site and used to carefully form subperiosteal movement of the bilateral paralumbar muscles off of the posterior lamina of the expected left L2-3 and bilateral L3-4 and L4-5 levels. Cross table lateral identified Kocher clamp at the L5-S1 level the L5 spinous process was marked. Head light and loupe magnification used for this case, the bilateral L3-4 andL4-5 interspaces carefully debrided the small amount of muscle attachment here and high-speed bur used to drill the medial aspect of the inferior articular processes of bilateral L3 and L4 approximately 10%. 3 mm Kerrison then used to enter the spinal canal over the superior aspect of the left L5 lamina carefully using the Kerrison to debris the attachment as a curet. Foraminotomy was then performed over the left L5 nerve root. The medial 10% superior articular process of L5 and then resected using an osteotome and 2 mm Kerrison. This allowed for identification of the thecal sac. Penfield 4 was then used to carefully mobilize the thecal sac medially and the L5 nerve root identified within the lateral recess flattened within the lateral recess of the disc. Carefully the lateral aspect of the L5 nerve root was identified and a Penfield 4 was used to mobilize the nerve medially and then the thecal sac more easily able to be mobilized medially and retracted using a Derricho retractor. Further foraminotomies was performed over the L4 nerve root and  the nerve root was noted to be decompressed using a ball tipped nerve hook for palpation of the neuroforamen. Ligamentum flavum was further debrided superior to the level L4-5 disc. Had a moderate amount of  further resection of the L4 lamina inferiorly was performed. With this then the disc space at L4-5 was easily visualized. Ligamentum flavum was debrided and lateral recess along the medial aspect L4-5 facet no further  decompression was necessary. Ball tip nerve probe was then able to carefully palpate the neuroforamen for L4 and L5 finding these to be well decompressed. Attention then turned to the left L3-4 level which was easily visualized with the loupes. Soft tissues debrided about the posterior aspect of the L3-4 interspace. High-speed bur and then used to carefully drill inferior 3 or 4 mm of the left side L3 lamina and on the medial aspect of the left L3 inferior articular process of 3 mm. The superior margin of the L4 lamina then carefully debrided with curette and a 2 mm kerrison then used to enter the spinal canal over the superior aspect of the L4 lamina resecting bone over the superior aspect and freeing up the attachment of ligamentum flavum here. Ligamentum flavum then debrided with the 2 mm and 3 mm Kerrisons we decompressed the L4 nerve root and the lateral recess left L3-4 decompressed using 2 and 3 mm Kerrisons sizing hypertrophic reflected ligamentum flavum extending superiorly. From was resected off the ventral aspect of the inferior margin of the L3 lamina. Hockey-stick nerve probe could then be passed out the L3 neuroforamen and the L4 neuroforamen. Venous bleeding encountered. Thrombin-soaked Gelfoam used to control this following this then the sac and the L5 nerve root were mobilized medially and the L3-4 disc examined and found not to be herniated. A high-speed bur used to drill the medial aspect of the inferior articular processes of right L3 and L4 approximately 10%. 3 mm Kerrison then used to enter the spinal canal over the superior aspect of the left L5 lamina carefully using the Kerrison to debris the attachment as a curet. Foraminotomy was then performed over the right L5 nerve root. The medial 10% superior articular process of L5 and then resected using an osteotome and 2 mm Kerrison. This allowed for identification of the thecal sac. Penfield 4 was then used to carefully mobilize the thecal sac  medially and the L5 nerve root identified within the lateral recess flattened within the lateral recess. Carefully the lateral aspect of the L5 nerve root was identified and a Penfield 4 was used to mobilize the nerve medially and then the thecal sac more easily able to be mobilized medially and retracted using a love retractor. Further foraminotomies was performed over the L4 nerve root the nerve root was noted to be decompressed. Ligamentum flavum was further debrided superiorly to the level L4-5 disc. Had a moderate amount of further resection of the L4 lamina inferiorly was performed. With this then the disc space at L4-5 was easily visualized. Ligamentum flavum was debrided and lateral recess along the medial aspect L4-5 facet no further decompression was necessary. Ball tip nerve probe was then able to carefully palpate the neuroforamen for L4 and L5 finding these to be well decompressed. Attention then turned to the right L3-4 level which was easily visualized with the loupes. Soft tissues debrided about the posterior aspect of the L3-4 interspace. High-speed bur and then used to carefully drill inferior 3 or 4 mm of the right side L3 lamina and on the medial aspect of the right L3 inferior articular process of 3 mm. The superior margin of the L4 lamina then carefully debrided with curette and a 2 mm kerrison then used to enter the spinal  canal over the superior aspect of the L4 lamina resecting bone over the superior aspect and freeing up the attachment of ligamentum flavum here. Ligamentum flavum then debrided with the 2 mm and 3 mm Kerrisons we decompressed the L4 nerve root and the lateral recess right L3-4 decompressed using 2 and 3 mm Kerrisons sizing hypertrophic reflected ligamentum flavum extending superiorly. From was resected off the ventral aspect of the inferior margin of the L3 lamina. Hockey-stick nerve probe could then be passed out the L3 neuroforamen and the L4 neuroforamen. Venous bleeding  encountered. Thrombin-soaked Gelfoam used to control this following this then the sac and the L4 nerve root were mobilized medially and the L3-4 level examined and found to be well decompressed.  Soft tissues debrided about the posterior aspect of the left L2-3 interspace. High-speed bur and then used to carefully drill inferior 3 or 4 mm of the left side L2 lamina and on the medial aspect of the left L2 inferior articular process of 3 mm. The superior margin of the L3 lamina then carefully debrided with curette and a 2 mm kerrison then used to enter the spinal canal over the superior aspect of the L3 lamina resecting bone over the superior aspect and freeing up the attachment of ligamentum flavum here. Ligamentum flavum then debrided with the 2 mm and 3 mm Kerrisons we decompressed the L4 nerve root and the lateral recess left L2-3 decompressed using 2 and 3 mm Kerrisons sizing hypertrophic reflected ligamentum flavum extending superiorly. From was resected off the ventral aspect of the inferior margin of the L2 lamina. Hockey-stick nerve probe could then be passed out the L2 neuroforamen and the L3 neuroforamen. Venous bleeding encountered. Thrombin-soaked Gelfoam used to control this following this then the sac and the L3 nerve root were mobilized medially and the L2-3 level examined and found to be well decompressed.  Irrigation was carried out down to this bleeding controlled with Gelfoam. Gelfoam was then removed. Irrigation carried careful examination demonstrated no active bleeding present. Retractors were then carefully removed Since carefully then the Bleeding was then controlled using thrombin-soaked Gelfoam small cottonoids. Small amount of bleeding within the soft tissue mass the laminotomy area was controlled using bipolar electrocautery. Irrigation was carried out using copious amounts of irrigant solution. All Gelfoam were then removed. No significant active bleeding present at the time of removal.  All instruments sponge counts were correct traction system was then carefully removed and bipolar electrocautery of any small bleeders. Lumbodorsal fascia was then carefully approximated with interrupted #1 Vicryl sutures, then deep subcutaneous layers were approximated with interrupted 0 Vicryl sutures the superficial subcutaneous layers approximated with interrupted 2-0 Vicryl sutures and the skin closed with a running subcutaneous stitch of 4-0 Vicryl. Dermabond was applied allowed to dry and then Mepilex bandage applied. Patient was then carefully returned to supine position on a stretcher, reactivated and extubated. She was then returned to recovery room in satisfactory condition.   Benjiman Core, PA-C perform the duties of assistant surgeon during this case. he was present from the beginning of the case to the end of the case assisting in transfer the patient from his stretcher to the OR table and back to the stretcher at the end of the case. Assisted in careful retraction and suction of the laminectomy site delicate neural structures operating under the operating room microscope. He performed closure of the incision from the fascia to the skin applying the dressing.     Jordynn Marcella E  11/26/2015, 4:32  PM

## 2015-11-26 NOTE — Anesthesia Procedure Notes (Addendum)
Procedure Name: Intubation Date/Time: 11/26/2015 1:48 PM Performed by: Sampson Si E Pre-anesthesia Checklist: Patient identified, Emergency Drugs available, Suction available, Patient being monitored and Timeout performed Patient Re-evaluated:Patient Re-evaluated prior to inductionOxygen Delivery Method: Circle system utilized Preoxygenation: Pre-oxygenation with 100% oxygen Intubation Type: IV induction Ventilation: Mask ventilation without difficulty and Oral airway inserted - appropriate to patient size Laryngoscope Size: Miller and 2 Grade View: Grade I Tube type: Oral Tube size: 7.5 mm Number of attempts: 1 Airway Equipment and Method: Stylet Placement Confirmation: ETT inserted through vocal cords under direct vision,  positive ETCO2,  CO2 detector and breath sounds checked- equal and bilateral Secured at: 22 cm Dental Injury: Teeth and Oropharynx as per pre-operative assessment  Comments: Intubation performed by Ryland Vira Agar, SRNA under direct supervision of CRNA and MDA

## 2015-11-26 NOTE — Brief Op Note (Signed)
PATIENT ID:      Tonya [name]  MRN:     [id number] DOB/AGE:    1940/12/21 / 75 y.o.       OPERATIVE REPORT   DATE OF PROCEDURE:  11/26/2015      PREOPERATIVE DIAGNOSIS:   L2-3 lateral recess stenosis, lumbar spinal stenosis L3-4 and L4-5, spondylolisthesis L4-5                                                       Body mass index is 26.46 kg/(m^2).    POSTOPERATIVE DIAGNOSIS:   L2-3 lateral recess stenosis, lumbar spinal stenosis L3-4 and                                                                     Body mass index is 26.46 kg/(m^2).    PROCEDURE:  Procedure(s): LUMBAR DECOMPRESSIVE SEMI HEMILAMINECTOMIES bilateral L3-4 AND bilateral L4-5, LEFT L2-3 SEMI HEMILAMINECTOMY    SURGEON: NITKA,JAMES E   ASSISTANT:  Benjiman Core, PA-C          ANESTHESIA:  General and marcaine 0.5% 1:1 exparel 1.3% total 30cc, Dr. Gifford Shave.     DRAINS:Foley to SD.  COMPLICATIONS:  None     EBL: 150cc  CONDITION:  Stable, extubated and in stable condition.    NITKA,JAMES E 11/26/2015, 4:11 PM

## 2015-11-27 ENCOUNTER — Encounter (HOSPITAL_COMMUNITY): Payer: Self-pay | Admitting: Specialist

## 2015-11-27 DIAGNOSIS — M4316 Spondylolisthesis, lumbar region: Secondary | ICD-10-CM | POA: Diagnosis not present

## 2015-11-27 DIAGNOSIS — E1122 Type 2 diabetes mellitus with diabetic chronic kidney disease: Secondary | ICD-10-CM | POA: Diagnosis not present

## 2015-11-27 DIAGNOSIS — F329 Major depressive disorder, single episode, unspecified: Secondary | ICD-10-CM | POA: Diagnosis not present

## 2015-11-27 DIAGNOSIS — I129 Hypertensive chronic kidney disease with stage 1 through stage 4 chronic kidney disease, or unspecified chronic kidney disease: Secondary | ICD-10-CM | POA: Diagnosis not present

## 2015-11-27 DIAGNOSIS — N183 Chronic kidney disease, stage 3 (moderate): Secondary | ICD-10-CM | POA: Diagnosis not present

## 2015-11-27 DIAGNOSIS — M4806 Spinal stenosis, lumbar region: Secondary | ICD-10-CM | POA: Diagnosis not present

## 2015-11-27 LAB — GLUCOSE, CAPILLARY
GLUCOSE-CAPILLARY: 87 mg/dL (ref 65–99)
GLUCOSE-CAPILLARY: 95 mg/dL (ref 65–99)
GLUCOSE-CAPILLARY: 127 mg/dL — AB (ref 65–99)
Glucose-Capillary: 98 mg/dL (ref 65–99)
Glucose-Capillary: 130 mg/dL — ABNORMAL HIGH (ref 65–99)

## 2015-11-27 LAB — CBC
HEMATOCRIT: 29.9 % — AB (ref 36.0–46.0)
Hemoglobin: 9.7 g/dL — ABNORMAL LOW (ref 12.0–15.0)
MCH: 29.7 pg (ref 26.0–34.0)
MCHC: 32.4 g/dL (ref 30.0–36.0)
MCV: 91.4 fL (ref 78.0–100.0)
PLATELETS: 184 10*3/uL (ref 150–400)
RBC: 3.27 MIL/uL — ABNORMAL LOW (ref 3.87–5.11)
RDW: 12.5 % (ref 11.5–15.5)
WBC: 9.5 10*3/uL (ref 4.0–10.5)

## 2015-11-27 LAB — BASIC METABOLIC PANEL
ANION GAP: 7 (ref 5–15)
BUN: 20 mg/dL (ref 6–20)
CALCIUM: 8.4 mg/dL — AB (ref 8.9–10.3)
CO2: 24 mmol/L (ref 22–32)
CREATININE: 1.28 mg/dL — AB (ref 0.44–1.00)
Chloride: 110 mmol/L (ref 101–111)
GFR, EST AFRICAN AMERICAN: 46 mL/min — AB (ref >60)
GFR, EST NON AFRICAN AMERICAN: 40 mL/min — AB (ref >60)
Glucose, Bld: 100 mg/dL — ABNORMAL HIGH (ref 65–99)
Potassium: 4.1 mmol/L (ref 3.5–5.1)
SODIUM: 141 mmol/L (ref 135–145)

## 2015-11-27 MED ORDER — CHLORHEXIDINE GLUCONATE CLOTH 2 % EX PADS
6.0000 | MEDICATED_PAD | Freq: Every day | CUTANEOUS | Status: DC
  Administered 2015-11-27 – 2015-11-30 (×4): 6 via TOPICAL

## 2015-11-27 MED ORDER — MUPIROCIN 2 % EX OINT
1.0000 "application " | TOPICAL_OINTMENT | Freq: Two times a day (BID) | CUTANEOUS | Status: DC
  Filled 2015-11-27: qty 22

## 2015-11-27 MED FILL — Thrombin For Soln 20000 Unit: CUTANEOUS | Qty: 1 | Status: AC

## 2015-11-27 NOTE — Anesthesia Postprocedure Evaluation (Signed)
Anesthesia Post Note  Patient: Tonya [name]  Procedure(s) Performed: Procedure(s) (LRB): LUMBAR DECOMPRESSIVE LAMINECTOMY L3-4 AND L4-5, LEFT L2-3 HEMILAMINECTOMY (N/A)  Patient location during evaluation: PACU Anesthesia Type: General Level of consciousness: awake and alert Pain management: pain level controlled Vital Signs Assessment: post-procedure vital signs reviewed and stable Respiratory status: spontaneous breathing, nonlabored ventilation, respiratory function stable and patient connected to nasal cannula oxygen Cardiovascular status: blood pressure returned to baseline and stable Postop Assessment: no signs of nausea or vomiting Anesthetic complications: no    Last Vitals:  Filed Vitals:   11/26/15 1900 11/27/15 0300  BP: 124/54 109/65  Pulse: 90 94  Temp: 37.1 C 36.8 C  Resp: 16 16    Last Pain:  Filed Vitals:   11/27/15 0841  PainSc: 2                  Catalina Gravel

## 2015-11-27 NOTE — Care Management Note (Signed)
Case Management Note  Patient Details  Name: Tonya [name] MRN: [id number] Date of Birth: [birth year]  Subjective/Objective:  75 yr old female s/p LUMBAR DECOMPRESSIVE SEMI HEMILAMINECTOMIES L3-4 AND L4-5, LEFT L2-3 SEM HEMILAMINECTOMY.               Action/Plan: Case manager spoke with patient concerning home health and DME needs at discharge. Choice was offered for home health, referral was called to Rhett Bannister, [facility] Liaison. Patient states she will be borrowing a rolling walker.  Expected Discharge Date:    11/28/15              Expected Discharge Plan:  [place]  In-House Referral:     Discharge planning Services  CM Consult  Post Acute Care Choice:  Home Health Choice offered to:  Patient  DME Arranged:  N/A DME Agency:  NA  HH Arranged:  PT [place] Agency:  [facility]  Status of Service:  Completed, signed off  Medicare Important Message Given:    Date Medicare IM Given:    Medicare IM give by:    Date Additional Medicare IM Given:    Additional Medicare Important Message give by:     If discussed at [place] of Stay Meetings, dates discussed:    Additional Comments:  Ninfa Meeker, RN 11/27/2015, 11:42 AM

## 2015-11-27 NOTE — Progress Notes (Signed)
Occupational Therapy Evaluation Patient Details Name: Tonya [name] MRN: [id number] DOB: 22-Apr-1941 Today's Date: 11/27/2015    History of Present Illness 75 yr old female s/p LUMBAR DECOMPRESSIVE SEMI HEMILAMINECTOMIES L3-4 AND L4-5, LEFT L2-3 SEM HEMILAMINECTOMY   Clinical Impression   PTA, pt independent with ADL and mobility and lived with her daughter/family. Began education regarding back precautions and ADL and functional mobility for ADL. Pt complaining of being "lightneaded". BP WNL. Pt vomited at end of session - nsg aware. Will follow acutely to address established goals to facilitate a safe D/C ome with initial 24/7 S and HHOT.     Follow Up Recommendations  Home health OT;Supervision/Assistance - 24 hour (initially)    Equipment Recommendations  3 in 1 bedside comode    Recommendations for Other Services       Precautions / Restrictions Precautions Precautions: Back Precaution Comments: verbally educated and reviewed with patient  Restrictions Weight Bearing Restrictions: No      Mobility Bed Mobility Overal bed mobility: Needs Assistance Bed Mobility: Rolling;Sidelying to Sit Rolling: Min guard Sidelying to sit: Min guard       General bed mobility comments: VCs for technique and sequencing with regards to spinal precautions  Transfers Overall transfer level: Needs assistance Equipment used: None Transfers: Sit to/from Stand Sit to Stand: Min assist         General transfer comment: Posterior lean when coming to standing requiring assist to stabilize. Provided RW to faciliate forward weight over base of support    Balance Overall balance assessment: Needs assistance Sitting-balance support: Feet supported Sitting balance-Leahy Scale: Good Sitting balance - Comments: no physical assist required   Standing balance support: During functional activity Standing balance-Leahy Scale: Poor Standing balance comment: assist to maintain center of  gravity, posterior bias                            ADL Overall ADL's : Needs assistance/impaired     Grooming: Sitting;Set up;Supervision/safety   Upper Body Bathing: Set up;Supervision/ safety;Sitting   Lower Body Bathing: Minimal assistance;Sit to/from stand   Upper Body Dressing : Set up;Supervision/safety;Sitting   Lower Body Dressing: Minimal assistance;Sit to/from stand   Toilet Transfer: Minimal assistance;Ambulation;RW Toilet Transfer Details (indicate cue type and reason): slight posterior lean Toileting- Clothing Manipulation and Hygiene: Minimal assistance;Sit to/from stand       Functional mobility during ADLs: Minimal assistance;Rolling walker;Cueing for safety;Cueing for sequencing General ADL Comments: Began education regarding compensatory techniques for ADL. Pt will be alone during the day while daughter is at work. More stable with use of RW due to slight posterior lean.      Vision     Perception     Praxis      Pertinent Vitals/Pain  3; back; repositioned;limited within pt's tolerance     Hand Dominance Right   Extremity/Trunk Assessment Upper Extremity Assessment Upper Extremity Assessment: Overall WFL for tasks assessed   Lower Extremity Assessment Lower Extremity Assessment: Overall WFL for tasks assessed   Cervical / Trunk Assessment Cervical / Trunk Assessment: Kyphotic   Communication Communication Communication: No difficulties   Cognition Arousal/Alertness: Awake/alert Behavior During Therapy: WFL for tasks assessed/performed Overall Cognitive Status: Within Functional Limits for tasks assessed                     General Comments       Exercises       Shoulder Instructions  Home Living Family/patient expects to be discharged to:: Private residence Living Arrangements: Children Available Help at Discharge: Family;Available PRN/intermittently Type of Home: House Home Access: Level entry      Home Layout: Two level;Able to live on main level with bedroom/bathroom     Bathroom Shower/Tub: Tub/shower unit Shower/tub characteristics: Architectural technologist: Standard Bathroom Accessibility: Yes How Accessible: Accessible via walker Home Equipment: [place] - single point;Shower seat;Grab bars - tub/shower          Prior Functioning/Environment Level of Independence: Independent             OT Diagnosis: Generalized weakness;Acute pain   OT Problem List: Decreased strength;Decreased range of motion;Decreased activity tolerance;Impaired balance (sitting and/or standing);Decreased safety awareness;Decreased knowledge of use of DME or AE;Decreased knowledge of precautions;Pain   OT Treatment/Interventions: Self-care/ADL training;DME and/or AE instruction;Therapeutic activities;Patient/family education;Balance training    OT Goals(Current goals can be found in the care plan section) Acute Rehab OT Goals Patient Stated Goal: to go home OT Goal Formulation: With patient Time For Goal Achievement: 12/11/15 Potential to Achieve Goals: Good  OT Frequency: Min 2X/week   Barriers to D/C:            Co-evaluation PT/OT/SLP Co-Evaluation/Treatment: Yes Reason for Co-Treatment: For patient/therapist safety (partial session)   OT goals addressed during session: ADL's and self-care;Proper use of Adaptive equipment and DME      End of Session Equipment Utilized During Treatment: Gait belt;Rolling walker Nurse Communication: Mobility status  Activity Tolerance: Patient tolerated treatment well Patient left: in chair;with call bell/phone within reach   Time: 1035-1102 OT Time Calculation (min): 27 min Charges:  OT General Charges $OT Visit: 1 Procedure OT Evaluation $OT Eval Moderate Complexity: 1 Procedure G-Codes: OT G-codes **NOT FOR INPATIENT CLASS** Functional Assessment Tool Used: clinical judgement Functional Limitation: Self care Self Care Current Status  [id number]): At least 20 percent but less than 40 percent impaired, limited or restricted Self Care Goal Status [id number]): At least 1 percent but less than 20 percent impaired, limited or restricted  Cam Dauphin,HILLARY 11/27/2015, 1:28 PM   [facility], OTR/L  [phone] 11/27/2015

## 2015-11-27 NOTE — Evaluation (Signed)
Physical Therapy Evaluation Patient Details Name: Tonya [name] MRN: [id number] DOB: July 31, 1941 Today's Date: 11/27/2015   History of Present Illness  75 yr old female s/p LUMBAR DECOMPRESSIVE SEMI HEMILAMINECTOMIES L3-4 AND L4-5, LEFT L2-3 SEM HEMILAMINECTOMY  Clinical Impression  Patient demonstrates deficits in functional mobility as indicated below. Will need continued skilled PT to address deficits and maximize function. Will see as indicated and progress as tolerated.    Follow Up Recommendations Home health PT;Supervision for mobility/OOB    Equipment Recommendations  Rolling walker with 5" wheels;3in1 (PT)    Recommendations for Other Services       Precautions / Restrictions Precautions Precautions: Back Precaution Comments: verbally educated and reviewed with patient  Restrictions Weight Bearing Restrictions: No      Mobility  Bed Mobility Overal bed mobility: Needs Assistance Bed Mobility: Rolling;Sidelying to Sit Rolling: Min guard Sidelying to sit: Min guard       General bed mobility comments: VCs for technique and sequencing with regards to spinal precautions  Transfers Overall transfer level: Needs assistance Equipment used: None Transfers: Sit to/from Stand Sit to Stand: Min assist         General transfer comment: Posterior lean when coming to standing requiring assist to stabilize. Provided RW to faciliate forward weight over base of support  Ambulation/Gait Ambulation/Gait assistance: Min assist Ambulation Distance (Feet): 90 Feet Assistive device: Rolling walker (2 wheeled) Gait Pattern/deviations: Step-through pattern;Decreased stride length;Narrow base of support Gait velocity: decreased Gait velocity interpretation: Below normal speed for age/gender General Gait Details: very slow and cautious with gait, posterior bias, min assist at times to faciliate forward weight shift over BOS. Use of Rw for stability. Cues for increased cadence. +  lightheadedness with ambulation  Stairs            Wheelchair Mobility    Modified Rankin (Stroke Patients Only)       Balance Overall balance assessment: Needs assistance Sitting-balance support: Feet supported Sitting balance-Leahy Scale: Good Sitting balance - Comments: no physical assist required   Standing balance support: During functional activity Standing balance-Leahy Scale: Poor Standing balance comment: assist to maintain center of gravity, posterior bias                             Pertinent Vitals/Pain      Home Living Family/patient expects to be discharged to:: Private residence Living Arrangements: Children Available Help at Discharge: Family;Available PRN/intermittently Type of Home: House Home Access: Level entry     Home Layout: Two level;Able to live on main level with bedroom/bathroom Home Equipment: Kasandra Knudsen - single point;Shower seat;Grab bars - tub/shower      Prior Function Level of Independence: Independent               Hand Dominance   Dominant Hand: Right    Extremity/Trunk Assessment   Upper Extremity Assessment: Overall WFL for tasks assessed           Lower Extremity Assessment: Overall WFL for tasks assessed      Cervical / Trunk Assessment: Kyphotic  Communication   Communication: No difficulties  Cognition Arousal/Alertness: Awake/alert Behavior During Therapy: WFL for tasks assessed/performed Overall Cognitive Status: Within Functional Limits for tasks assessed                      General Comments General comments (skin integrity, edema, etc.): patient with nausea and vomiting during session, + light headedness with  ambulation    Exercises        Assessment/Plan    PT Assessment Patient needs continued PT services  PT Diagnosis Difficulty walking;Abnormality of gait;Acute pain   PT Problem List Decreased strength;Decreased activity tolerance;Decreased balance;Decreased  mobility;Pain;Decreased knowledge of use of DME  PT Treatment Interventions DME instruction;Gait training;Functional mobility training;Therapeutic activities;Therapeutic exercise;Balance training;Patient/family education   PT Goals (Current goals can be found in the Care Plan section) Acute Rehab PT Goals Patient Stated Goal: to go home PT Goal Formulation: With patient Time For Goal Achievement: 12/11/15 Potential to Achieve Goals: Good    Frequency Min 5X/week   Barriers to discharge        Co-evaluation               End of Session Equipment Utilized During Treatment: Gait belt Activity Tolerance: Other (comment);Treatment limited secondary to medical complications (Comment) (patient with increased nausea and vomiting) Patient left: in chair;with call bell/phone within reach;with nursing/sitter in room Nurse Communication: Mobility status;Precautions;Other (comment) (vomiting)    Functional Assessment Tool Used: clinical judgement Functional Limitation: Mobility: Walking and moving around Mobility: Walking and Moving Around Current Status [phone]): At least 20 percent but less than 40 percent impaired, limited or restricted Mobility: Walking and Moving Around Goal Status [phone]): At least 1 percent but less than 20 percent impaired, limited or restricted    Time: 1039-1059 PT Time Calculation (min) (ACUTE ONLY): 20 min   Charges:   PT Evaluation $PT Eval Moderate Complexity: 1 Procedure     PT G Codes:   PT G-Codes **NOT FOR INPATIENT CLASS** Functional Assessment Tool Used: clinical judgement Functional Limitation: Mobility: Walking and moving around Mobility: Walking and Moving Around Current Status [id number]): At least 20 percent but less than 40 percent impaired, limited or restricted Mobility: Walking and Moving Around Goal Status [phone]): At least 1 percent but less than 20 percent impaired, limited or restricted    Duncan Dull 11/27/2015, 1:01 PM  Alben Deeds, [place] DPT  [phone]

## 2015-11-27 NOTE — Care Management Obs Status (Signed)
[place] NOTIFICATION   Patient Details  Name: KAYLINE WALLIS MRN: [id number] Date of Birth: 1941/02/16   Medicare Observation Status Notification Given:  Yes    Ninfa Meeker, RN 11/27/2015, 11:38 AM

## 2015-11-28 ENCOUNTER — Other Ambulatory Visit: Payer: Self-pay | Admitting: Family Medicine

## 2015-11-28 ENCOUNTER — Other Ambulatory Visit: Payer: Self-pay | Admitting: Cardiology

## 2015-11-28 DIAGNOSIS — M4316 Spondylolisthesis, lumbar region: Secondary | ICD-10-CM | POA: Diagnosis not present

## 2015-11-28 DIAGNOSIS — N183 Chronic kidney disease, stage 3 (moderate): Secondary | ICD-10-CM | POA: Diagnosis not present

## 2015-11-28 DIAGNOSIS — F329 Major depressive disorder, single episode, unspecified: Secondary | ICD-10-CM | POA: Diagnosis not present

## 2015-11-28 DIAGNOSIS — I129 Hypertensive chronic kidney disease with stage 1 through stage 4 chronic kidney disease, or unspecified chronic kidney disease: Secondary | ICD-10-CM | POA: Diagnosis not present

## 2015-11-28 DIAGNOSIS — M4806 Spinal stenosis, lumbar region: Secondary | ICD-10-CM | POA: Diagnosis not present

## 2015-11-28 DIAGNOSIS — E1122 Type 2 diabetes mellitus with diabetic chronic kidney disease: Secondary | ICD-10-CM | POA: Diagnosis not present

## 2015-11-28 LAB — BASIC METABOLIC PANEL
ANION GAP: 9 (ref 5–15)
Anion gap: 10 (ref 5–15)
BUN: 23 mg/dL — AB (ref 6–20)
BUN: 24 mg/dL — ABNORMAL HIGH (ref 6–20)
CHLORIDE: 105 mmol/L (ref 101–111)
CHLORIDE: 102 mmol/L (ref 101–111)
CO2: 22 mmol/L (ref 22–32)
CO2: 22 mmol/L (ref 22–32)
CREATININE: 1.76 mg/dL — AB (ref 0.44–1.00)
Calcium: 8.4 mg/dL — ABNORMAL LOW (ref 8.9–10.3)
Calcium: 8.4 mg/dL — ABNORMAL LOW (ref 8.9–10.3)
Creatinine, Ser: 1.63 mg/dL — ABNORMAL HIGH (ref 0.44–1.00)
GFR calc non Af Amer: 27 mL/min — ABNORMAL LOW (ref >60)
GFR, EST AFRICAN AMERICAN: 34 mL/min — AB (ref >60)
GFR, EST AFRICAN AMERICAN: 31 mL/min — AB (ref >60)
GFR, EST NON AFRICAN AMERICAN: 30 mL/min — AB (ref >60)
Glucose, Bld: 119 mg/dL — ABNORMAL HIGH (ref 65–99)
Glucose, Bld: 159 mg/dL — ABNORMAL HIGH (ref 65–99)
POTASSIUM: 3.9 mmol/L (ref 3.5–5.1)
POTASSIUM: 4.1 mmol/L (ref 3.5–5.1)
SODIUM: 136 mmol/L (ref 135–145)
SODIUM: 134 mmol/L — AB (ref 135–145)

## 2015-11-28 LAB — CBC
HEMATOCRIT: 26.4 % — AB (ref 36.0–46.0)
HEMOGLOBIN: 8.6 g/dL — AB (ref 12.0–15.0)
MCH: 29.7 pg (ref 26.0–34.0)
MCHC: 32.6 g/dL (ref 30.0–36.0)
MCV: 91 fL (ref 78.0–100.0)
PLATELETS: 154 10*3/uL (ref 150–400)
RBC: 2.9 MIL/uL — AB (ref 3.87–5.11)
RDW: 12.4 % (ref 11.5–15.5)
WBC: 11.3 10*3/uL — AB (ref 4.0–10.5)

## 2015-11-28 LAB — HEMOGLOBIN A1C
Hgb A1c MFr Bld: 5.6 % (ref 4.8–5.6)
Mean Plasma Glucose: 114 mg/dL

## 2015-11-28 LAB — GLUCOSE, CAPILLARY
GLUCOSE-CAPILLARY: 112 mg/dL — AB (ref 65–99)
GLUCOSE-CAPILLARY: 159 mg/dL — AB (ref 65–99)
Glucose-Capillary: 119 mg/dL — ABNORMAL HIGH (ref 65–99)
Glucose-Capillary: 138 mg/dL — ABNORMAL HIGH (ref 65–99)

## 2015-11-28 LAB — BRAIN NATRIURETIC PEPTIDE: B NATRIURETIC PEPTIDE 5: 45 pg/mL (ref 0.0–100.0)

## 2015-11-28 LAB — TROPONIN I

## 2015-11-28 MED ORDER — FERROUS GLUCONATE 324 (38 FE) MG PO TABS: 324.0000 mg | ORAL_TABLET | Freq: Two times a day (BID) | ORAL | Status: DC

## 2015-11-28 NOTE — Telephone Encounter (Signed)
REFILL 

## 2015-11-28 NOTE — Progress Notes (Signed)
     Subjective: 2 Days Post-Op Procedure(s) (LRB): LUMBAR DECOMPRESSIVE LAMINECTOMY L3-4 AND L4-5, LEFT L2-3 HEMILAMINECTOMY (N/A) Awake,alert and oriented x 4. Walked in hallway with PT yesterday then experienced nausea and emesis. Tolerating Po narcotics and nourishment.   Patient reports pain as mild.    Objective:   VITALS:  Temp:  [98.1 F (36.7 C)-99.1 F (37.3 C)] 98.6 F (37 C) (03/02 0500) Pulse Rate:  [62-65] 64 (03/02 0500) Resp:  [16] 16 (03/02 0500) BP: (106-128)/(37-60) 111/45 mmHg (03/02 0500) SpO2:  [94 %-98 %] 94 % (03/02 0500)  Neurologically intact ABD soft Neurovascular intact Sensation intact distally Intact pulses distally Dorsiflexion/Plantar flexion intact Incision: scant drainage   LABS  Recent Labs  11/27/15 0405  HGB 9.7*  WBC 9.5  PLT 184    Recent Labs  11/27/15 0405 11/28/15 0302  NA 141 136  K 4.1 3.9  CL 110 105  CO2 24 22  BUN 20 23*  CREATININE 1.28* 1.63*  GLUCOSE 100* 119*   No results for input(s): LABPT, INR in the last 72 hours.   Assessment/Plan: 2 Days Post-Op Procedure(s) (LRB): LUMBAR DECOMPRESSIVE LAMINECTOMY L3-4 AND L4-5, LEFT L2-3 HEMILAMINECTOMY (N/A)  Anemia, mild, creatinine slight increase normal 1.5 for her.  Advance diet Up with therapy Plan for discharge tomorrow Discharge home with home health  Push po fluid try for 500cc more intake. Recheck BMET and CBC in AM 3/3.   Tonya [name] E 11/28/2015, 8:50 AM

## 2015-11-28 NOTE — Progress Notes (Signed)
Physical Therapy Treatment Patient Details Name: Tonya [name] MRN: [id number] DOB: [birth year] Today's Date: 11/28/2015    History of Present Illness 75 yr old female s/p LUMBAR DECOMPRESSIVE SEMI HEMILAMINECTOMIES L3-4 AND L4-5, LEFT L2-3 SEM HEMILAMINECTOMY    PT Comments    Patient seen for progression of activity and training for home. Patient mobilizing well with less assist this session. Was able to perform stair negotiation without difficulty. Extensive education with patient and daughter regarding activities and compliance for home.  OF NOTE: During session, patient with vomiting episodes as well as incontinence during vomiting. Nsg notified.     Follow Up Recommendations  Home health PT;Supervision for mobility/OOB     Equipment Recommendations  Rolling walker with 5" wheels;3in1 (PT)    Recommendations for Other Services       Precautions / Restrictions Precautions Precautions: Back Precaution Comments: patient still with poor recall of precautions Restrictions Weight Bearing Restrictions: No    Mobility  Bed Mobility               General bed mobility comments: received in chair, ended EOB  Transfers Overall transfer level: Needs assistance Equipment used: Rolling walker (2 wheeled)   Sit to Stand: Min guard         General transfer comment: Min guard for safety with sit to stand from EOB x 1. VCs for hand placement and technique.  Ambulation/Gait Ambulation/Gait assistance: Min guard Ambulation Distance (Feet): 160 Feet Assistive device: Rolling walker (2 wheeled) Gait Pattern/deviations: Step-through pattern;Decreased stride length;Narrow base of support Gait velocity: decreased Gait velocity interpretation: Below normal speed for age/gender General Gait Details: Improved stability this session but remians very slow and guarded with ambulation speed.    Stairs Stairs: Yes Stairs assistance: Supervision Stair Management: One rail  Right;Step to pattern;Forwards Number of Stairs: 5 (x2) General stair comments: No physical assist required, cues for safety with inital sequencing  Wheelchair Mobility    Modified Rankin (Stroke Patients Only)       Balance Overall balance assessment: Needs assistance Sitting-balance support: Feet supported Sitting balance-Leahy Scale: Good     Standing balance support: Bilateral upper extremity supported Standing balance-Leahy Scale: Poor Standing balance comment: RW for support, inital posterior bias                    Cognition Arousal/Alertness: Awake/alert Behavior During Therapy: WFL for tasks assessed/performed Overall Cognitive Status: Within Functional Limits for tasks assessed       Memory: Decreased recall of precautions              Exercises      General Comments General comments (skin integrity, edema, etc.): Patient with 3 episodes of nausea and vomiting. During vomiting, patient incontinent of urine. Required assist with bathing and hygiene.       Pertinent Vitals/Pain Pain Assessment: Faces Faces Pain Scale: Hurts little more Pain Location: back pain Pain Descriptors / Indicators: Aching;Discomfort;Grimacing;Guarding Pain Intervention(s): Monitored during session;Repositioned    Home Living                      Prior Function            PT Goals (current goals can now be found in the care plan section) Acute Rehab PT Goals Patient Stated Goal: to go home PT Goal Formulation: With patient Time For Goal Achievement: 12/11/15 Potential to Achieve Goals: Good Progress towards PT goals: Progressing toward goals    Frequency  Min  5X/week    PT Plan Current plan remains appropriate    Co-evaluation             End of Session Equipment Utilized During Treatment: Gait belt Activity Tolerance: Other (comment);Treatment limited secondary to medical complications (Comment) (patient with increased nausea and  vomiting) Patient left: in bed;with call bell/phone within reach;with family/visitor present (sitting EOB for lunch)     Time: [id number] PT Time Calculation (min) (ACUTE ONLY): 29 min  Charges:  $Gait Training: 8-22 mins $Self Care/Home Management: 8-22                    G CodesDuncan Dull 12-21-2015, 1:33 PM Alben Deeds, [place] DPT  [phone]

## 2015-11-28 NOTE — Progress Notes (Addendum)
Nausea and emesis x 3 after PT today. Cr. 1.6 . BP is low But receiving all of her antihhypertensive agents.  Tolerating PO diet and is voiding. Yesterday's HGb 9.7 Hct 29.9 Troponin neg <0.03 and BNP 38. ECG NSR no acute changes. No cardiac event. Recurring nausea and emesis with PT, low SBP. I suspect she is anemic with mild hypovolemia and combined with Antihypertensive agents is having orthostatic hypotension Symptoms. I have asked [facility] to see and help [place] to manage Her medications. Will restart IVF with NS with 20 meq Kcl/liter at 100cc/hr.

## 2015-11-28 NOTE — Progress Notes (Signed)
Occupational Therapy Treatment Patient Details Name: Tonya [name] MRN: [id number] DOB: [birth year] Today's Date: 11/28/2015    History of present illness 75 yr old female s/p LUMBAR DECOMPRESSIVE SEMI HEMILAMINECTOMIES L3-4 AND L4-5, LEFT L2-3 SEM HEMILAMINECTOMY   OT comments  Pt making progress toward OT goals but limited this session by nausea with emesis; RN aware and giving medication at end of session. Educated pt on compensatory strategies for LB ADLs and use of AE for increased independence; pt able to return demo use of reacher. Pt able to recall 1/3 precautions at start of session; reviewed all precautions with pt. D/c plan remains appropriate at this time. Will continue to follow acutely.   Follow Up Recommendations  Home health OT;Supervision/Assistance - 24 hour (initially)    Equipment Recommendations  3 in 1 bedside comode    Recommendations for Other Services      Precautions / Restrictions Precautions Precautions: Back Precaution Comments: Pt able to verbally recall 1/3 back precautions. Reviewed all precautions with pt. Restrictions Weight Bearing Restrictions: No       Mobility Bed Mobility Overal bed mobility: Needs Assistance Bed Mobility: Rolling;Sidelying to Sit Rolling: Supervision Sidelying to sit: Supervision       General bed mobility comments: VCs for log roll technique. Pt able to perform bed mobility with supervision for safety. Heavy use of bed rails with HOB flat.  Transfers Overall transfer level: Needs assistance Equipment used: Rolling walker (2 wheeled) Transfers: Sit to/from Stand Sit to Stand: Min guard         General transfer comment: Min guard for safety with sit to stand from EOB x 1. VCs for hand placement and technique.    Balance Overall balance assessment: Needs assistance Sitting-balance support: Feet supported;No upper extremity supported Sitting balance-Leahy Scale: Good     Standing balance support: Bilateral  upper extremity supported;During functional activity Standing balance-Leahy Scale: Poor Standing balance comment: RW for support                   ADL Overall ADL's : Needs assistance/impaired     Grooming: Wash/dry face;Set up;Sitting         Lower Body Bathing Details (indicate cue type and reason): Educated pt on use of long handled sponge; pt verbalized understanding. Upper Body Dressing : Supervision/safety;Sitting Upper Body Dressing Details (indicate cue type and reason): to doff hospital gown Lower Body Dressing: Min guard;Sit to/from stand Lower Body Dressing Details (indicate cue type and reason): Educated pt on use of long handled shoe horn and reacher; pt able to return demo use of reacher for doffing socks. Pt able to cross foot over opposte knee; educated on compensatory strategies for LB ADLs.           Tub/Shower Transfer Details (indicate cue type and reason): Educated on need fo supervision with tub transfer and bathing upon return home; pt reports she will be able to completing bathing with daughter present. Functional mobility during ADLs: Min guard;Rolling walker General ADL Comments: Reviewed precautions and compensatory strategies. Began AE education but session limited secondary to pt with nausea and emesis.       Vision                     Perception     Praxis      Cognition   Behavior During Therapy: [facility] for tasks assessed/performed Overall Cognitive Status: Within Functional Limits for tasks assessed       Memory: Decreased recall  of precautions               Extremity/Trunk Assessment               Exercises     Shoulder Instructions       General Comments      Pertinent Vitals/ Pain       Pain Assessment: Faces Faces Pain Scale: Hurts little more Pain Location: back with movement +nausea Pain Descriptors / Indicators: Grimacing;Sore Pain Intervention(s): Limited activity within patient's  tolerance;Monitored during session;Repositioned  Home Living                                          Prior Functioning/Environment              Frequency Min 2X/week     Progress Toward Goals  OT Goals(current goals can now be found in the care plan section)  Progress towards OT goals: Progressing toward goals  Acute Rehab OT Goals Patient Stated Goal: to go home OT Goal Formulation: With patient  Plan Discharge plan remains appropriate    Co-evaluation                 End of Session Equipment Utilized During Treatment: Gait belt;Rolling walker;Other (comment) (AE)   Activity Tolerance Other (comment);Patient tolerated treatment well (Limited by nausea with emesis)   Patient Left in chair;with call bell/phone within reach;with nursing/sitter in room   Nurse Communication Other (comment) (nausea with emesis)        Time: [id number] OT Time Calculation (min): 24 min  Charges: OT General Charges $OT Visit: 1 Procedure OT Treatments $Self Care/Home Management : 23-37 mins  Binnie Kand M.S., OTR/L Pager: [phone]  11/28/2015, 9:06 AM

## 2015-11-28 NOTE — Progress Notes (Signed)
Each time pt has worked with therapy this morning pt has become nauseous and vomited (3 times total thus far) . Received IV zofran and reports relief of symptoms, also says she feels improvement after vomiting. She denies chest pain, SOB, or feeling light headed or dizzy. Dr. Louanne Skye notified, received orders for EKG, stat troponin and BNP. Will continue to monitor pt.      Raquel James     11/28/15 1226  Vitals  Temp 98.4 F (36.9 C)  BP (!) 95/46 mmHg  Pulse Rate 64  Resp 18  Oxygen Therapy  SpO2 93 %  O2 Device Room Air

## 2015-11-29 ENCOUNTER — Observation Stay (HOSPITAL_COMMUNITY): Payer: Medicare Other

## 2015-11-29 ENCOUNTER — Encounter (HOSPITAL_COMMUNITY): Payer: Self-pay | Admitting: Family Medicine

## 2015-11-29 DIAGNOSIS — I10 Essential (primary) hypertension: Secondary | ICD-10-CM | POA: Diagnosis not present

## 2015-11-29 DIAGNOSIS — E1122 Type 2 diabetes mellitus with diabetic chronic kidney disease: Secondary | ICD-10-CM | POA: Diagnosis not present

## 2015-11-29 DIAGNOSIS — D649 Anemia, unspecified: Secondary | ICD-10-CM | POA: Diagnosis present

## 2015-11-29 DIAGNOSIS — F329 Major depressive disorder, single episode, unspecified: Secondary | ICD-10-CM

## 2015-11-29 DIAGNOSIS — N183 Chronic kidney disease, stage 3 (moderate): Secondary | ICD-10-CM

## 2015-11-29 DIAGNOSIS — E119 Type 2 diabetes mellitus without complications: Secondary | ICD-10-CM

## 2015-11-29 DIAGNOSIS — M4806 Spinal stenosis, lumbar region: Principal | ICD-10-CM

## 2015-11-29 DIAGNOSIS — I480 Paroxysmal atrial fibrillation: Secondary | ICD-10-CM | POA: Diagnosis not present

## 2015-11-29 DIAGNOSIS — Z419 Encounter for procedure for purposes other than remedying health state, unspecified: Secondary | ICD-10-CM | POA: Insufficient documentation

## 2015-11-29 DIAGNOSIS — R509 Fever, unspecified: Secondary | ICD-10-CM | POA: Insufficient documentation

## 2015-11-29 DIAGNOSIS — M4316 Spondylolisthesis, lumbar region: Secondary | ICD-10-CM | POA: Diagnosis not present

## 2015-11-29 DIAGNOSIS — J9811 Atelectasis: Secondary | ICD-10-CM | POA: Diagnosis not present

## 2015-11-29 DIAGNOSIS — D638 Anemia in other chronic diseases classified elsewhere: Secondary | ICD-10-CM | POA: Diagnosis present

## 2015-11-29 DIAGNOSIS — K5900 Constipation, unspecified: Secondary | ICD-10-CM | POA: Diagnosis not present

## 2015-11-29 DIAGNOSIS — I129 Hypertensive chronic kidney disease with stage 1 through stage 4 chronic kidney disease, or unspecified chronic kidney disease: Secondary | ICD-10-CM | POA: Diagnosis not present

## 2015-11-29 DIAGNOSIS — R5082 Postprocedural fever: Secondary | ICD-10-CM

## 2015-11-29 LAB — LACTIC ACID, PLASMA
Lactic Acid, Venous: 0.6 mmol/L (ref 0.5–2.0)
Lactic Acid, Venous: 0.8 mmol/L (ref 0.5–2.0)

## 2015-11-29 LAB — CBC
HEMATOCRIT: 24.9 % — AB (ref 36.0–46.0)
HEMOGLOBIN: 8.4 g/dL — AB (ref 12.0–15.0)
MCH: 30.8 pg (ref 26.0–34.0)
MCHC: 33.7 g/dL (ref 30.0–36.0)
MCV: 91.2 fL (ref 78.0–100.0)
Platelets: 142 10*3/uL — ABNORMAL LOW (ref 150–400)
RBC: 2.73 MIL/uL — ABNORMAL LOW (ref 3.87–5.11)
RDW: 12.3 % (ref 11.5–15.5)
WBC: 9.4 10*3/uL (ref 4.0–10.5)

## 2015-11-29 LAB — URINALYSIS, ROUTINE W REFLEX MICROSCOPIC
BILIRUBIN URINE: NEGATIVE
GLUCOSE, UA: NEGATIVE mg/dL
HGB URINE DIPSTICK: NEGATIVE
KETONES UR: NEGATIVE mg/dL
Leukocytes, UA: NEGATIVE
Nitrite: NEGATIVE
PH: 5.5 (ref 5.0–8.0)
Protein, ur: NEGATIVE mg/dL
SPECIFIC GRAVITY, URINE: 1.017 (ref 1.005–1.030)

## 2015-11-29 LAB — GLUCOSE, CAPILLARY
GLUCOSE-CAPILLARY: 124 mg/dL — AB (ref 65–99)
GLUCOSE-CAPILLARY: 138 mg/dL — AB (ref 65–99)
GLUCOSE-CAPILLARY: 145 mg/dL — AB (ref 65–99)
Glucose-Capillary: 122 mg/dL — ABNORMAL HIGH (ref 65–99)

## 2015-11-29 LAB — BASIC METABOLIC PANEL
ANION GAP: 12 (ref 5–15)
BUN: 25 mg/dL — ABNORMAL HIGH (ref 6–20)
CHLORIDE: 102 mmol/L (ref 101–111)
CO2: 18 mmol/L — ABNORMAL LOW (ref 22–32)
Calcium: 8.2 mg/dL — ABNORMAL LOW (ref 8.9–10.3)
Creatinine, Ser: 1.67 mg/dL — ABNORMAL HIGH (ref 0.44–1.00)
GFR, EST AFRICAN AMERICAN: 33 mL/min — AB (ref >60)
GFR, EST NON AFRICAN AMERICAN: 29 mL/min — AB (ref >60)
Glucose, Bld: 120 mg/dL — ABNORMAL HIGH (ref 65–99)
POTASSIUM: 4.1 mmol/L (ref 3.5–5.1)
SODIUM: 132 mmol/L — AB (ref 135–145)

## 2015-11-29 LAB — PROCALCITONIN: Procalcitonin: <0.1 ng/mL

## 2015-11-29 MED ORDER — METHOCARBAMOL 500 MG PO TABS: 500.0000 mg | ORAL_TABLET | Freq: Three times a day (TID) | ORAL | Status: DC | PRN

## 2015-11-29 MED ORDER — HYDROCODONE-ACETAMINOPHEN 5-325 MG PO TABS: 1.0000 | ORAL_TABLET | ORAL | Status: DC | PRN

## 2015-11-29 NOTE — Progress Notes (Signed)
Physical Therapy Treatment Patient Details Name: Tonya [name] MRN: [id number] DOB: [birth year] Today's Date: Dec 07, 2015    History of Present Illness 75 yr old female s/p LUMBAR DECOMPRESSIVE SEMI HEMILAMINECTOMIES L3-4 AND L4-5, LEFT L2-3 SEM HEMILAMINECTOMY    PT Comments    Patient doing much better this am, mobilizing well without pain. Reports no nausea or vomiting today. Ambulated increased distance and educated on car transfers. Anticipate patient will be safe for d/c home when medically ready.  Follow Up Recommendations  Home health PT;Supervision for mobility/OOB     Equipment Recommendations  Rolling walker with 5" wheels;3in1 (PT)    Recommendations for Other Services       Precautions / Restrictions Precautions Precautions: Back Precaution Comments: pt. able to recall 2/3, required cues for "no arching" Restrictions Weight Bearing Restrictions: No    Mobility  Bed Mobility               General bed mobility comments: received in chair   Transfers Overall transfer level: Needs assistance Equipment used: Rolling walker (2 wheeled) Transfers: Sit to/from Stand;Stand Pivot Transfers Sit to Stand: Supervision Stand pivot transfers: Supervision          Ambulation/Gait Ambulation/Gait assistance: Supervision Ambulation Distance (Feet): 410 Feet Assistive device: Rolling walker (2 wheeled) Gait Pattern/deviations: Step-through pattern;Decreased stride length Gait velocity: decreased   General Gait Details: improved cadence with VCs, improved stability, no evidence of posterior bias this session   Stairs            Wheelchair Mobility    Modified Rankin (Stroke Patients Only)       Balance     Sitting balance-Leahy Scale: Good       Standing balance-Leahy Scale: Fair                      Cognition Arousal/Alertness: Awake/alert Behavior During Therapy: WFL for tasks assessed/performed Overall Cognitive Status: Within  Functional Limits for tasks assessed                      Exercises      General Comments General comments (skin integrity, edema, etc.): educated on car transfers      Pertinent Vitals/Pain Pain Assessment: No/denies pain    Home Living                      Prior Function            PT Goals (current goals can now be found in the care plan section) Acute Rehab PT Goals Patient Stated Goal: to go home PT Goal Formulation: With patient Time For Goal Achievement: 12/11/15 Potential to Achieve Goals: Good Progress towards PT goals: Progressing toward goals    Frequency  Min 5X/week    PT Plan Current plan remains appropriate    Co-evaluation             End of Session Equipment Utilized During Treatment: Gait belt Activity Tolerance: Patient tolerated treatment well Patient left: in chair;with call bell/phone within reach;with nursing/sitter in room     Time: [id number] PT Time Calculation (min) (ACUTE ONLY): 16 min  Charges:  $Gait Training: 8-22 mins                    G CodesDuncan Dull 07-Dec-2015, 11:03 AM Alben Deeds, PT DPT  [phone]

## 2015-11-29 NOTE — Progress Notes (Signed)
     Subjective: 3 Days Post-Op Procedure(s) (LRB): LUMBAR DECOMPRESSIVE LAMINECTOMY L3-4 AND L4-5, LEFT L2-3 HEMILAMINECTOMY (N/A) Awake,alert and oriented x 4. Low grade temp I suspect pulmonary due to atelectasis. No BM as yet. She is on prevalite for diarrhea stools due to colon surgery.Feels better. Seen by Dr. Wyline Copas, [facility], I appreciate his help with her numerous antihypertension medications. BP is stablizing, still has IVF running and will continue.  Patient reports pain as mild.    Objective:   VITALS:  Temp:  [98.7 F (37.1 C)-100.6 F (38.1 C)] 98.7 F (37.1 C) (03/03 1459) Pulse Rate:  [56-62] 56 (03/03 1459) Resp:  [17-18] 17 (03/03 1459) BP: (102-110)/(42-46) 108/46 mmHg (03/03 1459) SpO2:  [92 %] 92 % (03/03 1459) Weight:  [176 lb 11.2 oz (80.151 kg)] 176 lb 11.2 oz (80.151 kg) (03/03 0500)  Neurologically intact ABD soft Neurovascular intact Sensation intact distally Intact pulses distally Dorsiflexion/Plantar flexion intact Incision: no drainage No cellulitis present   LABS  Recent Labs  11/27/15 0405 11/28/15 1901 11/29/15 0545  HGB 9.7* 8.6* 8.4*  WBC 9.5 11.3* 9.4  PLT 184 154 142*    Recent Labs  11/28/15 1901 11/29/15 0545  NA 134* 132*  K 4.1 4.1  CL 102 102  CO2 22 18*  BUN 24* 25*  CREATININE 1.76* 1.67*  GLUCOSE 159* 120*   No results for input(s): LABPT, INR in the last 72 hours.   Assessment/Plan: 3 Days Post-Op Procedure(s) (LRB): LUMBAR DECOMPRESSIVE LAMINECTOMY L3-4 AND L4-5, LEFT L2-3 HEMILAMINECTOMY (N/A)  Up with therapy D/C IV fluids Plan for discharge tomorrow Discharge home with home health  Will be seen by PT/OT and will check AM lab. If she is stable then she will need to Follow up with her primary care Dr. Bevelyn Ngo in the 1-2 weeks to follow for anemia and for BP and diabetes. Will see in the AM and determine if she is stable for discharge.  Tonya [name] E 11/29/2015, 6:57 PM

## 2015-11-29 NOTE — Progress Notes (Signed)
[facility] PROGRESS NOTE  Tonya [name] [id number] DOB: Mar 25, 1941 DOA: 11/26/2015 PCP: [name], MD  Assessment/Plan: 1. Hypotension  - Continue with gentle IVF hydration as tolerated - Continue to hold losartan, HCTZ, spironolactone for now, and cautiously continuing diltiazem and Coreg  - Nursing has been asked to perform daily wts and chart strict I/Os for better assessment of fluid status  - Orthostatics neg  2. Fever - Tmax of 38.1C orally overnight with no clear source  - There are no respiratory complaints and lungs CTAB  - Dysuria, hematuria are denied and no suprapubic tenderness on exam  - CXR clear, UA unremarkable - Blood cx pending - Will hold off on empiric abx for now as pt stable and no clear source  - If pt noted to be febrile again, recommend follow up core/rectal temp to confirm fever  3. Nausea and vomiting  - Pt is s/p bowel resection for cancer with resultant diarrhea vs constipation - Pt reports lack of regular bowel movement x over past week - Abd xray demonstrates stool per my own read with decreased BS on exam - Given trial of miralax  4. POD #2 from multilevel lumbar decompression  - Pain is well-controlled - Site appears c/d/i  - Management per ortho   5. Depression  - Appears to be stable  - Continue Celexa   6. Hypertension  - Has been running low here as above   7. Type II DM  - Managed with oral agents at home  - On SSI correctional here with good glycemic control   8. CKD stage III - SCr 1.67, up from apparent baseline of 1.4  - Improving with IVF  - Avoid nephrotoxins where possible  - Will repeat BMET in AM  9. DVT ppx: SCDs  Antibiotics: Anti-infectives    Start     Dose/Rate Route Frequency Ordered Stop   11/27/15 0600  vancomycin (VANCOCIN) IVPB 1000 mg/200 mL premix     1,000 mg 200 mL/hr over 60 Minutes Intravenous  Once 11/26/15 1639 11/27/15 0931   11/26/15 0900  vancomycin  (VANCOCIN) IVPB 1000 mg/200 mL premix     1,000 mg 200 mL/hr over 60 Minutes Intravenous To [facility] 11/25/15 1242 11/26/15 1439      HPI/Subjective: Reports feeling somewhat bloated, early satiety  Objective: Filed Vitals:   11/28/15 1258 11/28/15 1900 11/29/15 0500 11/29/15 1459  BP: 98/46 110/42 102/42 108/46  Pulse:  62 59 56  Temp:  100.6 F (38.1 C) 99.1 F (37.3 C) 98.7 F (37.1 C)  TempSrc:  Oral Oral Oral  Resp:  18 18 17   Height:      Weight:   80.151 kg (176 lb 11.2 oz)   SpO2:    92%    Intake/Output Summary (Last 24 hours) at 11/29/15 1550 Last data filed at 11/29/15 0650  Gross per 24 hour  Intake   1140 ml  Output    800 ml  Net    340 ml   Filed Weights   11/26/15 1028 11/29/15 0500  Weight: 72.122 kg (159 lb) 80.151 kg (176 lb 11.2 oz)    Exam:   General:  Awake, in nad  Cardiovascular: regular, s1, s2  Respiratory: normal resp effort, no wheezing  Abdomen: soft,nondistended  Musculoskeletal: perfused, no clubbing   Data Reviewed: Basic Metabolic Panel:  Recent Labs Lab 11/27/15 0405 11/28/15 0302 11/28/15 1901 11/29/15 0545  NA 141 136 134* 132*  K 4.1 3.9 4.1 4.1  CL 110 105 102 102  CO2 24 22 22  18*  GLUCOSE 100* 119* 159* 120*  BUN 20 23* 24* 25*  CREATININE 1.28* 1.63* 1.76* 1.67*  CALCIUM 8.4* 8.4* 8.4* 8.2*   Liver Function Tests: No results for input(s): AST, ALT, ALKPHOS, BILITOT, PROT, ALBUMIN in the last 168 hours. No results for input(s): LIPASE, AMYLASE in the last 168 hours. No results for input(s): AMMONIA in the last 168 hours. CBC:  Recent Labs Lab 11/27/15 0405 11/28/15 1901 11/29/15 0545  WBC 9.5 11.3* 9.4  HGB 9.7* 8.6* 8.4*  HCT 29.9* 26.4* 24.9*  MCV 91.4 91.0 91.2  PLT 184 154 142*   Cardiac Enzymes:  Recent Labs Lab 11/28/15 1239  TROPONINI <0.03   BNP (last 3 results)  Recent Labs  11/28/15 1239  BNP 45.0    ProBNP (last 3 results) No results for input(s): PROBNP in  the last 8760 hours.  CBG:  Recent Labs Lab 11/28/15 1122 11/28/15 1658 11/28/15 2018 11/29/15 0556 11/29/15 1157  GLUCAP 159* 119* 138* 122* 145*    Recent Results (from the past 240 hour(s))  Surgical pcr screen     Status: Abnormal   Collection Time: 11/20/15  9:28 AM  Result Value Ref Range Status   MRSA, PCR NEGATIVE NEGATIVE Final   Staphylococcus aureus POSITIVE (A) NEGATIVE Final    Comment:        The Xpert SA Assay (FDA approved for NASAL specimens in patients over 73 years of age), is one component of a comprehensive surveillance program.  Test performance has been validated by [facility] for patients greater than or equal to 12 year old. It is not intended to diagnose infection nor to guide or monitor treatment.      Studies: Dg Chest Port 1 View  11/29/2015  CLINICAL DATA:  Fever. EXAM: PORTABLE CHEST 1 VIEW COMPARISON:  04/05/2014. FINDINGS: Mediastinum hilar structures are normal. Cardiomegaly with normal pulmonary vascularity. Low lung volumes with mild bibasilar atelectasis and infiltrates. No pleural effusion or pneumothorax IMPRESSION: 1.  Stable cardiomegaly.  No pulmonary venous congestion 2. Low lung volumes with mild bibasilar atelectasis and/or infiltrates. Electronically Signed   By: Marcello Moores  Register   On: 11/29/2015 07:32   Dg Abd Portable 1v  11/29/2015  CLINICAL DATA:  Nausea, vomiting, constipation. EXAM: PORTABLE ABDOMEN - 1 VIEW COMPARISON:  None. FINDINGS: The bowel gas pattern is normal. Phleboliths are noted in the pelvis. Surgical sutures are seen in the right side of the abdomen. IMPRESSION: No evidence of bowel obstruction or ileus. Electronically Signed   By: Marijo Conception, M.D.   On: 11/29/2015 12:17    Scheduled Meds: . aspirin  81 mg Oral Daily  . atorvastatin  20 mg Oral Daily  . calcium-vitamin D  1 tablet Oral BID  . carvedilol  12.5 mg Oral BID WC  . Chlorhexidine Gluconate Cloth  6 each Topical Daily  . cholestyramine  light  4 g Oral BID AC & HS  . citalopram  40 mg Oral Daily  . diltiazem  240 mg Oral Daily  . docusate sodium  100 mg Oral BID  . ferrous sulfate  325 mg Oral BID  . insulin aspart  0-9 Units Subcutaneous TID WC  . loratadine  10 mg Oral Daily  . nystatin cream  1 application Topical BID  . pantoprazole  80 mg Oral Daily  . sodium chloride flush  3 mL Intravenous Q12H   Continuous Infusions: . sodium chloride    .  0.9 % NaCl with KCl 20 mEq / L 100 mL/hr at 11/29/15 [id number]    Principal Problem:   Spinal stenosis, lumbar region, with neurogenic claudication Active Problems:   Diabetes mellitus with no complication (HCC)   Depression   Essential hypertension, benign   Coronary atherosclerosis   ATRIAL FIBRILLATION, PAROXYSMAL   CKD (chronic kidney disease) stage 3, GFR 30-59 ml/min   Anemia   Fever   Surgery, elective    Savanna Dooley, [facility] Pager [phone]. If 7PM-7AM, please contact night-coverage at [email or web address], password [facility] 11/29/2015, 3:50 PM  LOS: 3 days

## 2015-11-29 NOTE — Progress Notes (Addendum)
Occupational Therapy Treatment Patient Details Name: Tonya [name] MRN: [id number] DOB: 11/18/1940 Today's Date: 11/29/2015    History of present illness 75 yr old female s/p LUMBAR DECOMPRESSIVE SEMI HEMILAMINECTOMIES L3-4 AND L4-5, LEFT L2-3 SEM HEMILAMINECTOMY   OT comments  Pt. Reports feeling much better today. Denies any nausea.  Able to complete bed mobility, and ambulation To/from b.room including tub transfer.  Pt. Clear for d/c from OT.  Will alert OTR/L to sign off.    Follow Up Recommendations  Home health OT;Supervision/Assistance - 24 hour    Equipment Recommendations  3 in 1 bedside comode    Recommendations for Other Services      Precautions / Restrictions Precautions Precautions: Back Precaution Comments: pt. able to recall 2/3, required cues for "no arching" Restrictions Weight Bearing Restrictions: No       Mobility Bed Mobility Overal bed mobility: Modified Independent Bed Mobility: Rolling;Sidelying to Sit Rolling: Modified independent (Device/Increase time) Sidelying to sit: Modified independent (Device/Increase time)       General bed mobility comments: hob flat, no rails, exits on left side. initiated log roll without cues  Transfers Overall transfer level: Needs assistance Equipment used: Rolling walker (2 wheeled) Transfers: Sit to/from [facility] Sit to Stand: Supervision Stand pivot transfers: Supervision            Balance                                   ADL Overall ADL's : Needs assistance/impaired                                 Tub/ Shower Transfer: Tub transfer;Supervision/safety;Grab bars Tub/Shower Transfer Details (indicate cue type and reason): pt. able to side step over tub for use of simulated R faucet, reports she has grab bars at home for use Functional mobility during ADLs: Supervision/safety General ADL Comments: Reviewed precautions and compensatory strategies. able  to complete toileting, tub transfer, and bed mobility      Vision                     Perception     Praxis      Cognition   Behavior During Therapy: [facility] for tasks assessed/performed Overall Cognitive Status: Within Functional Limits for tasks assessed                       Extremity/Trunk Assessment               Exercises     Shoulder Instructions       General Comments      Pertinent Vitals/ Pain       Pain Assessment: No/denies pain  Home Living                                          Prior Functioning/Environment              Frequency Min 2X/week     Progress Toward Goals  OT Goals(current goals can now be found in the care plan section)  Progress towards OT goals: Goals met/education completed, patient discharged from [facility] Discharge plan remains appropriate    Co-evaluation  End of Session Equipment Utilized During Treatment: Gait belt;Rolling walker   Activity Tolerance Patient tolerated treatment well   Patient Left in chair;with call bell/phone within reach   Nurse Communication          Time: 0223-3612 OT Time Calculation (min): 12 min  Charges: OT General Charges $OT Visit: 1 Procedure OT Treatments $Self Care/Home Management : 8-22 mins  Janice Coffin, COTA/L 11/29/2015, 9:53 AM

## 2015-11-29 NOTE — Consult Note (Signed)
[facility] Consultation - Medicine  DONNALYN TIESZEN [id number] DOB: 06/08/1941 DOA: 11/26/2015  Referring physician: ED physician PCP: [name], MD  Specialists: Dr. Louanne Skye (orthopedic surgery), Dr. Percival Spanish (cardiology)   Chief Complaint:  Nausea and vomiting  HPI: Tonya [name] is a 75 y.o. female with PMH of hypertension, type 2 diabetes mellitus, coronary artery disease, depression, and severe lumbar spinal stenosis who was admitted to the hospital on 11/26/2015 for elective multilevel decompression of the lumbar spine. Patient was taken to the OR by Dr. Louanne Skye 11/26/2015 and reportedly tolerated the procedure quite well. Unfortunately, the postoperative period has been complicated by low blood pressures and nausea/vomiting. Each time that physical therapy has attempted to work with the patient, the activities of been precluded by patient's nausea and vomiting. Diastolic blood pressure had been marginal, in the 40s to low 50s, prior to surgery, but subsequently, systolic pressures have also been running low, now in the 90s. Patient has history hypertension followed by Dr. Percival Spanish and is managed with losartan, hydrochlorothiazide, spironolactone, diltiazem, and Coreg in the outpatient setting. All of these agents have been continued here. Dr. Louanne Skye has requested medical consultation to assist with evaluation of marginal blood pressures and nausea/vomiting.   Patient is evaluated on the surgical unit where she appears to be resting comfortably and in no acute distress. She is noted to be febrile at 38.1 C, but is surprised to hear this. She denies subjective fever or chills. She also denies dysuria, hematuria, cough, or dyspnea. She endorses nausea and vomiting with minimal activity, but denies GI upset while at rest. She denies diarrhea. She endorses sore throat, onset following surgery, but denies rhinorrhea or recent sick contacts. Site from the surgical incision site, patient denies  presence of wounds. She reports feeling quite well at rest, that consistently develops nausea and vomiting with activity. She denies dizziness or vertigo and does not typically have the symptoms at home.  We greatly appreciate the opportunity to participate in this wonderful lady's care.    Where does patient live?   At home   Can patient participate in ADLs?  Yes       Review of Systems:   General: no fevers, chills, sweats, weight change, poor appetite, or fatigue HEENT: no blurry vision, hearing changes or sore throat Pulm: no dyspnea, cough, or wheeze CV: no chest pain or palpitations Abd: no abdominal pain, diarrhea, or constipation. Nausea and vomiting GU: no dysuria, hematuria, increased urinary frequency, or urgency  Ext: no leg edema Neuro: no focal weakness, numbness, or tingling, no vision change or hearing loss Skin: no rash, no wounds aside from surgical site MSK: No muscle spasm, no deformity, no red, hot, or swollen joint Heme: No easy bruising or bleeding Travel history: No recent long distant travel    Allergy:  Allergies  Allergen Reactions  . Amoxicillin-Pot Clavulanate Swelling    Augmentin per patient, tongue swelling    Past Medical History  Diagnosis Date  . Osteoarthritis   . Edema, peripheral   . CAD (coronary artery disease)     s/p BMS to LAD  . Breast pain     left  . Hyperlipidemia   . Hypertension   . Adenocarcinoma, colon ([place])   . Sciatica   . Chronic diarrhea   . GERD (gastroesophageal reflux disease)   . Depression   . Anxiety   . Osteoarthritis   . IBS (irritable bowel syndrome)   . Degenerative disk disease   . Carpal  tunnel syndrome   . Diabetes mellitus, type 2 ([place])   . History of pneumonia   . Nocturia   . Fall at home 03/16/2014    no fx; multiple facial lacerations, abrasions and hematoma  . Dysrhythmia     h/o AFIB  . S/P coronary artery stent placement 2007  . Anemia     iron deficiency  . Colon polyps     Tubular  adenoma  . Headache     Past Surgical History  Procedure Laterality Date  . Knee arthroscopy      bilateral  . Cholecystectomy    . Cardiac catheterization  12/10    LAD stent patent. Insignificant CAD, otherwise EF 60-65%  . Partial colectomy  11/2008    right, for adenocarcinoma  . Replacement total knee  [id number]    right, complicated by hemarthrosis   . Hematoma evacuation      after knee replacement  . Total knee revision  03/08/2012    Procedure: TOTAL KNEE REVISION;  Surgeon: Garald Balding, MD;  Location: [place];  Service: Orthopedics;  Laterality: Right;  removal total knee hardware and placement of antibiotic cement spacer and antibiotic beads  . Carpal tunnel release    . Total knee revision  05/17/2012    Procedure: TOTAL KNEE REVISION;  Surgeon: Garald Balding, MD;  Location: [place];  Service: Orthopedics;  Laterality: Right;  right total knee revision, removal of antibiotic spacer  . Total knee arthroplasty Left 04/03/2014    dr whitfield  . Total knee arthroplasty Left 04/03/2014    Procedure: TOTAL KNEE ARTHROPLASTY;  Surgeon: Garald Balding, MD;  Location: [place];  Service: Orthopedics;  Laterality: Left;  . Cataract extraction  03/2015  . Dilation and curettage of uterus    . Tonsillectomy    . Lumbar laminectomy N/A 11/26/2015    Procedure: LUMBAR DECOMPRESSIVE LAMINECTOMY L3-4 AND L4-5, LEFT L2-3 HEMILAMINECTOMY;  Surgeon: Jessy Oto, MD;  Location: [place];  Service: Orthopedics;  Laterality: N/A;    Social History:  reports that she has never smoked. She has never used smokeless tobacco. She reports that she does not drink alcohol or use illicit drugs.  Family History:  Family History  Problem Relation Age of Onset  . Heart disease Mother     massive MI age 24  . Heart attack Mother   . Heart disease Father     Massive MI age 78  . Prostate cancer Father   . Colon cancer Neg Hx      Prior to Admission medications   Medication Sig Start Date End Date  Taking? Authorizing Provider  acetaminophen (TYLENOL) 325 MG tablet Take 2 tablets (650 mg total) by mouth every 6 (six) hours as needed for mild pain (or Fever >/= 101). 04/05/14  Yes Mike Craze Petrarca, PA-C  aspirin 81 MG tablet Take 81 mg by mouth daily.   Yes Historical Provider, MD  atorvastatin (LIPITOR) 20 MG tablet TAKE 1 TABLET BY MOUTH DAILY. 10/21/15  Yes Minus Breeding, MD  calcium-vitamin D (OSCAL WITH D 500-200) 500-200 MG-UNIT per tablet Take 1 tablet by mouth 2 (two) times daily.     Yes Historical Provider, MD  citalopram (CELEXA) 40 MG tablet TAKE 1 TABLET BY MOUTH EVERY DAY 11/18/15  Yes Amy E Bedsole, MD  diltiazem (CARDIZEM CD) 240 MG 24 hr capsule TAKE ONE CAPSULE BY MOUTH EVERY DAY 06/14/15  Yes Amy E Bedsole, MD  ferrous sulfate 325 (65 FE)  MG EC tablet Take 325 mg by mouth 2 (two) times daily.   Yes Amy E Bedsole, MD  glipizide-metformin (METAGLIP) 2.5-250 MG per tablet TAKE 2 TABLETS BY MOUTH TWICE A DAY BEFORE A MEAL 06/19/15  Yes Amy E Bedsole, MD  losartan-hydrochlorothiazide (HYZAAR) 100-25 MG per tablet TAKE 1 TABLET BY MOUTH DAILY. 06/05/15  Yes Minus Breeding, MD  omeprazole (PRILOSEC) 20 MG capsule TAKE 2 CAPSULES BY MOUTH EVERY DAY Patient taking differently: TAKE 1 CAPSULES BY MOUTH EVERY DAY 06/14/15  Yes Amy Cletis [place], MD  PREVALITE 4 g packet TAKE 1 PACKET TWICE A DAY WITH A MEAL, TAKE AT LEAST 2 HOURS AWAY FROM OTHER MEDICATIONS. 11/07/15  Yes Milus Banister, MD  carvedilol (COREG) 12.5 MG tablet TAKE 1 TABLET BY MOUTH 2 TIMES DAILY WITH A MEAL. 11/28/15   Amy Cletis [place], MD  cetirizine (ZYRTEC) 10 MG tablet Take 10 mg by mouth daily as needed for allergies.    Historical Provider, MD  nystatin cream (MYCOSTATIN) Apply 1 application topically 2 (two) times daily as needed for dry skin. Patient not taking: Reported on 11/15/2015 04/25/15   [name], MD  spironolactone (ALDACTONE) 25 MG tablet Take 1 tablet (25 mg total) by mouth daily. NEED OV. 11/28/15   Minus Breeding, MD     Physical Exam: Filed Vitals:   11/28/15 0500 11/28/15 1226 11/28/15 1258 11/28/15 1900  BP: 111/45 95/46 98/46  110/42  Pulse: 64 64  62  Temp: 98.6 F (37 C) 98.4 F (36.9 C)  100.6 F (38.1 C)  TempSrc: Oral   Oral  Resp: 16 18  18   Height:      Weight:      SpO2: 94% 93%     General: Not in acute distress HEENT:       Eyes: PERRL, EOMI, no scleral icterus or conjunctival pallor.       ENT: No discharge from the ears or nose, no pharyngeal ulcers, oral mucosa moist and pink       Neck: No JVD, no bruit, no appreciable mass Heme: No cervical adenopathy, no pallor Cardiac: S1/S2, RRR, soft systolic murmur at the apex, No gallops or rubs. Pulm: Good air movement bilaterally. No rales, wheezing, rhonchi or rubs. Abd: Soft, nondistended, nontender, no rebound pain or gaurding, no mass or organomegaly, BS present. Ext: No LE edema bilaterally. 2+DP/PT pulse bilaterally. Musculoskeletal: No gross deformity, no red, hot, swollen joints   Skin: No rashes or wounds on exposed surfaces. Surgical dressings c/d/i Neuro: Alert, oriented X3, cranial nerves II-XII grossly intact. No focal findings Psych: Patient is not overtly psychotic, appropriate mood and affect.  Labs on Admission:  Basic Metabolic Panel:  Recent Labs Lab 11/27/15 0405 11/28/15 0302 11/28/15 1901  NA 141 136 134*  K 4.1 3.9 4.1  CL 110 105 102  CO2 24 22 22   GLUCOSE 100* 119* 159*  BUN 20 23* 24*  CREATININE 1.28* 1.63* 1.76*  CALCIUM 8.4* 8.4* 8.4*   Liver Function Tests: No results for input(s): AST, ALT, ALKPHOS, BILITOT, PROT, ALBUMIN in the last 168 hours. No results for input(s): LIPASE, AMYLASE in the last 168 hours. No results for input(s): AMMONIA in the last 168 hours. CBC:  Recent Labs Lab 11/27/15 0405 11/28/15 1901  WBC 9.5 11.3*  HGB 9.7* 8.6*  HCT 29.9* 26.4*  MCV 91.4 91.0  PLT 184 154   Cardiac Enzymes:  Recent Labs Lab 11/28/15 1239  TROPONINI <0.03    BNP (last 3  results)  Recent Labs  11/28/15 1239  BNP 45.0    ProBNP (last 3 results) No results for input(s): PROBNP in the last 8760 hours.  CBG:  Recent Labs Lab 11/27/15 1650 11/28/15 0641 11/28/15 1122 11/28/15 1658 11/28/15 2018  GLUCAP 127* 112* 159* 119* 138*    Radiological Exams on Admission: No results found.  EKG: Independently reviewed.  Abnormal findings:  Normal sinus rhythm, low-voltage QRS  Assessment/Plan  1. Hypotension  - Suspect this is multifactorial and primarily related to dehydration and antihypertensives, with possible contributions from narcotic analgesia and perhaps even developing sepsis  - Agree with gentle IVF hydration overnight, continue NS at 100 cc/hr  - Recommend holding losartan and HCTZ for now, and cautiously continuing spironolactone, diltiazem, and Coreg  - Nursing has been asked to perform daily wts and chart strict I/Os for better assessment of fluid status  - In light of new fever, infectious work-up recommended as below  - MI ruled-out with negative EKG and troponin in pt without CP   - Check orthostatics  2. Fever - Developed fever to 38.1C overnight with no clear source  - There are no respiratory complaints and lungs CTAB  - Dysuria, hematuria are denied and no suprapubic tenderness on exam  - Will check CXR, UA, blood and urine cultures, procalcitonin, lactic acid  - Will hold off on empiric abx for now as pt stable and no clear source  - Sore throat endorsed since coming out of surgery, but likely secondary to endotracheal intubation rather than URI    3. Nausea and vomiting  - Etiology remains uncertain  - Anticipate improvement with volume resuscitation and elevation in BPs  - If N/V persists with improvement in fluid status and BP, may need to extend workup   4. POD #2 from multilevel lumbar decompression  - Pain is well-controlled - Site appears c/d/i  - Management per ortho    5. Depression  - Appears to be stable   - Continue Celexa     6. Hypertension  - Has been running low here as above   7. Type II DM  - Managed with oral agents at home  - On SSI correctional here with good glycemic control   8. CKD stage III - SCr 1.76, up from apparent baseline of 1.4  - Anticipate improvement with IVF  - Repeat chem panel tomorrow  - Avoid nephrotoxins where possible    DVT ppx:   SCDs  Code Status: Full code Family Communication: None at bed side.                Disposition Plan: Admit to inpatient   Date of Service 11/29/2015    Vianne Bulls, MD [facility] Pager [phone]  If 7PM-7AM, please contact night-coverage [email or web address] Password [email or web address] 11/29/2015, 2:59 AM

## 2015-11-30 DIAGNOSIS — M4806 Spinal stenosis, lumbar region: Secondary | ICD-10-CM | POA: Diagnosis not present

## 2015-11-30 DIAGNOSIS — M4316 Spondylolisthesis, lumbar region: Secondary | ICD-10-CM | POA: Diagnosis not present

## 2015-11-30 DIAGNOSIS — E1122 Type 2 diabetes mellitus with diabetic chronic kidney disease: Secondary | ICD-10-CM | POA: Diagnosis not present

## 2015-11-30 DIAGNOSIS — I129 Hypertensive chronic kidney disease with stage 1 through stage 4 chronic kidney disease, or unspecified chronic kidney disease: Secondary | ICD-10-CM | POA: Diagnosis not present

## 2015-11-30 DIAGNOSIS — N183 Chronic kidney disease, stage 3 (moderate): Secondary | ICD-10-CM | POA: Diagnosis not present

## 2015-11-30 DIAGNOSIS — F329 Major depressive disorder, single episode, unspecified: Secondary | ICD-10-CM | POA: Diagnosis not present

## 2015-11-30 LAB — BASIC METABOLIC PANEL
ANION GAP: 10 (ref 5–15)
BUN: 24 mg/dL — AB (ref 6–20)
CALCIUM: 8.4 mg/dL — AB (ref 8.9–10.3)
CO2: 19 mmol/L — ABNORMAL LOW (ref 22–32)
Chloride: 106 mmol/L (ref 101–111)
Creatinine, Ser: 1.51 mg/dL — ABNORMAL HIGH (ref 0.44–1.00)
GFR calc Af Amer: 38 mL/min — ABNORMAL LOW (ref >60)
GFR, EST NON AFRICAN AMERICAN: 33 mL/min — AB (ref >60)
Glucose, Bld: 132 mg/dL — ABNORMAL HIGH (ref 65–99)
POTASSIUM: 4.6 mmol/L (ref 3.5–5.1)
SODIUM: 135 mmol/L (ref 135–145)

## 2015-11-30 LAB — URINE CULTURE

## 2015-11-30 LAB — GLUCOSE, CAPILLARY
GLUCOSE-CAPILLARY: 122 mg/dL — AB (ref 65–99)
Glucose-Capillary: 132 mg/dL — ABNORMAL HIGH (ref 65–99)

## 2015-11-30 NOTE — Progress Notes (Signed)
Pt discharge education and instructions completed with pt; pt voices understanding and denied any questions. Pt IV removed; back incision foam dsg remains clean dry and intact with no active stain or bleeding noted. Pt handed her prescription for Norco; pt discharge home with daughter to pick her up to transport home. Pt transported off unit via wheelchair with belongings to the side. Delia Heady RN

## 2015-11-30 NOTE — Progress Notes (Addendum)
Doing well IVF discontinued. PT/OT today, may shower today and change Bandage post shower. Legs NV normal. Plan to discharge post PT/OT Told to see her primary care MD in 1-2 weeks due to fluctuating BP with anemia and for followup of her diabetes.

## 2015-11-30 NOTE — Progress Notes (Signed)
Subjective: 4 Days Post-Op Procedure(s) (LRB): LUMBAR DECOMPRESSIVE LAMINECTOMY L3-4 AND L4-5, LEFT L2-3 HEMILAMINECTOMY (N/A) Patient reports pain as mild.    Objective: Vital signs in last 24 hours: Temp:  [98.7 F (37.1 C)] 98.7 F (37.1 C) (03/04 0559) Pulse Rate:  [56-62] 62 (03/04 0559) Resp:  [16-17] 16 (03/04 0559) BP: (108-129)/(46-58) 129/58 mmHg (03/04 0559) SpO2:  [92 %-93 %] 93 % (03/04 0559) Weight:  [51.12 kg (112 lb 11.2 oz)] 51.12 kg (112 lb 11.2 oz) (03/04 0559)  Intake/Output from previous day: 03/03 0701 - 03/04 0700 In: -  Out: 1400 [Urine:1400] Intake/Output this shift:     Recent Labs  11/28/15 1901 11/29/15 0545  HGB 8.6* 8.4*    Recent Labs  11/28/15 1901 11/29/15 0545  WBC 11.3* 9.4  RBC 2.90* 2.73*  HCT 26.4* 24.9*  PLT 154 142*    Recent Labs  11/29/15 0545 11/30/15 0345  NA 132* 135  K 4.1 4.6  CL 102 106  CO2 18* 19*  BUN 25* 24*  CREATININE 1.67* 1.51*  GLUCOSE 120* 132*  CALCIUM 8.2* 8.4*   No results for input(s): LABPT, INR in the last 72 hours.  Neurologically intact  Assessment/Plan: 4 Days Post-Op Procedure(s) (LRB): LUMBAR DECOMPRESSIVE LAMINECTOMY L3-4 AND L4-5, LEFT L2-3 HEMILAMINECTOMY (N/A) Up with therapy Discharge home with home health  Bobby Barton C 11/30/2015, 8:52 AM

## 2015-11-30 NOTE — Discharge Summary (Signed)
Physician Discharge Summary      Patient ID: Tonya [name] MRN: [id number] DOB/AGE: [birth year] 75 y.o.  Admit date: 11/26/2015 Discharge date:   Admission Diagnoses:  Principal Problem:   Spinal stenosis, lumbar region, with neurogenic claudication Active Problems:   Diabetes mellitus with no complication ([place])   Depression   Essential hypertension, benign   Coronary atherosclerosis   ATRIAL FIBRILLATION, PAROXYSMAL   CKD (chronic kidney disease) stage 3, GFR 30-59 ml/min   Anemia   Fever   Surgery, elective   Discharge Diagnoses:  Same  Past Medical History  Diagnosis Date  . Osteoarthritis   . Edema, peripheral   . CAD (coronary artery disease)     s/p BMS to LAD  . Breast pain     left  . Hyperlipidemia   . Hypertension   . Adenocarcinoma, colon ([place])   . Sciatica   . Chronic diarrhea   . GERD (gastroesophageal reflux disease)   . Depression   . Anxiety   . Osteoarthritis   . IBS (irritable bowel syndrome)   . Degenerative disk disease   . Carpal tunnel syndrome   . Diabetes mellitus, type 2 ([place])   . History of pneumonia   . Nocturia   . Fall at home 03/16/2014    no fx; multiple facial lacerations, abrasions and hematoma  . Dysrhythmia     h/o AFIB  . S/P coronary artery stent placement 2007  . Anemia     iron deficiency  . Colon polyps     Tubular adenoma  . Headache     Surgeries: Procedure(s): LUMBAR DECOMPRESSIVE LAMINECTOMY L3-4 AND L4-5, LEFT L2-3 HEMILAMINECTOMY on 11/26/2015   Consultants:    Discharged Condition: Improved  Hospital Course: Tonya [name] is an 75 y.o. female who was admitted 11/26/2015 with a chief complaint of No chief complaint on file. , and found to have a diagnosis of Spinal stenosis, lumbar region, with neurogenic claudication.  She was brought to the operating room on 11/26/2015 and underwent the above named procedures.    She was given perioperative antibiotics:  Anti-infectives    Start     Dose/Rate  Route Frequency Ordered Stop   11/27/15 0600  vancomycin (VANCOCIN) IVPB 1000 mg/200 mL premix     1,000 mg 200 mL/hr over 60 Minutes Intravenous  Once 11/26/15 1639 11/27/15 0931   11/26/15 0900  vancomycin (VANCOCIN) IVPB 1000 mg/200 mL premix     1,000 mg 200 mL/hr over 60 Minutes Intravenous To [facility] 11/25/15 1242 11/26/15 1439    Postoperatively this patient recovered in the PACU was transferred to the [place] floor 5 N room 4. She was remained on IV antibiotics has a history of hypertension. Postoperative day #1 her incision was dry she was awake alert and oriented 4. Her blood pressures were low and she was given antihypertensive agents just the same and following physical therapy demonstrated nausea and vomiting in a vasovagal pattern. Her IV fluids were discontinued as she was tolerating by mouth liquids and by mouth nourishment and by mouth narcotics. On postoperative day 2 she demonstrated a similar outer no nausea and vomiting following physical therapy so that EKG bone and and BNP levels were obtained which were all normal. Internal medicine consult noted the 5 antihypertensive agents that the patient was continued on despite low systolic blood pressure and continuing her IV. IV was restarted and her medicines of Hyzaar and spironolactone were held for systolic blood pressure of less than 115.  Gradually her blood pressures improved and on postoperative day #3 she demonstrated improved tolerance of therapy and occupational therapy without nausea or vomiting KUB demonstrated retained stool but no acute abnormalities. She tolerated therapy and showed improved overall mental function and physical function so that her laboratory tests also demonstrated improved creatinine from 1.7 to 1.5. Her baseline is 1.5. Her dressings remained dry and were changed on a daily basis after postoperative day 2 on postoperative day #4 she was allowed to shower and change dressing following shower. IVs  were discontinued and she remained stable. Her hemoglobin initially preop was 12 following surgery was 9.9 however with hydration her hemoglobin fell to 8.4. She has been maintained on iron supplements which she had been taking preadmission ferrous sulfate 340 mg twice a day with meal. She is advised to see her primary care physician regarding her antihypertensive medicines as she will need to remain off of the Hyzaar and Spironolactone until her blood pressure is further evaluated and if she resumes hypertension. Her primary care physician is Dr. Shanon Brow. On postoperative day #4 she was seen to be awake alert oriented 4 lower extremities neurovascularly normal. Her incision dry resting to be changed following bathing. She was discharged home on postoperative day #4. She was given sequential compression devices and early ambulation, for DVT prophylaxis.  She benefited maximally from their hospital stay and there were no complications.    Recent vital signs:  Filed Vitals:   11/30/15 0559 11/30/15 0912  BP: 129/58 130/53  Pulse: 62 65  Temp: 98.7 F (37.1 C) 99.6 F (37.6 C)  Resp: 16 18    Recent laboratory studies:  Results for orders placed or performed during the hospital encounter of 11/26/15  Glucose, capillary  Result Value Ref Range   Glucose-Capillary 107 (H) 65 - 99 mg/dL  Glucose, capillary  Result Value Ref Range   Glucose-Capillary 79 65 - 99 mg/dL  CBC  Result Value Ref Range   WBC 9.5 4.0 - 10.5 K/uL   RBC 3.27 (L) 3.87 - 5.11 MIL/uL   Hemoglobin 9.7 (L) 12.0 - 15.0 g/dL   HCT 29.9 (L) 36.0 - 46.0 %   MCV 91.4 78.0 - 100.0 fL   MCH 29.7 26.0 - 34.0 pg   MCHC 32.4 30.0 - 36.0 g/dL   RDW 12.5 11.5 - 15.5 %   Platelets 184 150 - 400 K/uL  Basic metabolic panel  Result Value Ref Range   Sodium 141 135 - 145 mmol/L   Potassium 4.1 3.5 - 5.1 mmol/L   Chloride 110 101 - 111 mmol/L   CO2 24 22 - 32 mmol/L   Glucose, Bld 100 (H) 65 - 99 mg/dL   BUN 20 6 - 20 mg/dL    Creatinine, Ser 1.28 (H) 0.44 - 1.00 mg/dL   Calcium 8.4 (L) 8.9 - 10.3 mg/dL   GFR calc non Af Amer 40 (L) >60 mL/min   GFR calc Af Amer 46 (L) >60 mL/min   Anion gap 7 5 - 15  Hemoglobin A1c  Result Value Ref Range   Hgb A1c MFr Bld 5.6 4.8 - 5.6 %   Mean Plasma Glucose 114 mg/dL  Glucose, capillary  Result Value Ref Range   Glucose-Capillary 87 65 - 99 mg/dL   Comment 1 Notify RN    Comment 2 Document in Chart   Glucose, capillary  Result Value Ref Range   Glucose-Capillary 95 65 - 99 mg/dL  Glucose, capillary  Result Value Ref  Range   Glucose-Capillary 98 65 - 99 mg/dL  Glucose, capillary  Result Value Ref Range   Glucose-Capillary 130 (H) 65 - 99 mg/dL  Basic metabolic panel  Result Value Ref Range   Sodium 136 135 - 145 mmol/L   Potassium 3.9 3.5 - 5.1 mmol/L   Chloride 105 101 - 111 mmol/L   CO2 22 22 - 32 mmol/L   Glucose, Bld 119 (H) 65 - 99 mg/dL   BUN 23 (H) 6 - 20 mg/dL   Creatinine, Ser 1.63 (H) 0.44 - 1.00 mg/dL   Calcium 8.4 (L) 8.9 - 10.3 mg/dL   GFR calc non Af Amer 30 (L) >60 mL/min   GFR calc Af Amer 34 (L) >60 mL/min   Anion gap 9 5 - 15  Glucose, capillary  Result Value Ref Range   Glucose-Capillary 127 (H) 65 - 99 mg/dL  Glucose, capillary  Result Value Ref Range   Glucose-Capillary 112 (H) 65 - 99 mg/dL  Glucose, capillary  Result Value Ref Range   Glucose-Capillary 159 (H) 65 - 99 mg/dL   Comment 1 Notify RN   Brain natriuretic peptide  Result Value Ref Range   B Natriuretic Peptide 45.0 0.0 - 100.0 pg/mL  Troponin I  Result Value Ref Range   Troponin I <0.03 <0.031 ng/mL  Glucose, capillary  Result Value Ref Range   Glucose-Capillary 119 (H) 65 - 99 mg/dL   Comment 1 Notify RN    Comment 2 Document in Chart   Basic metabolic panel  Result Value Ref Range   Sodium 132 (L) 135 - 145 mmol/L   Potassium 4.1 3.5 - 5.1 mmol/L   Chloride 102 101 - 111 mmol/L   CO2 18 (L) 22 - 32 mmol/L   Glucose, Bld 120 (H) 65 - 99 mg/dL   BUN 25 (H)  6 - 20 mg/dL   Creatinine, Ser 1.67 (H) 0.44 - 1.00 mg/dL   Calcium 8.2 (L) 8.9 - 10.3 mg/dL   GFR calc non Af Amer 29 (L) >60 mL/min   GFR calc Af Amer 33 (L) >60 mL/min   Anion gap 12 5 - 15  CBC  Result Value Ref Range   WBC 9.4 4.0 - 10.5 K/uL   RBC 2.73 (L) 3.87 - 5.11 MIL/uL   Hemoglobin 8.4 (L) 12.0 - 15.0 g/dL   HCT 24.9 (L) 36.0 - 46.0 %   MCV 91.2 78.0 - 100.0 fL   MCH 30.8 26.0 - 34.0 pg   MCHC 33.7 30.0 - 36.0 g/dL   RDW 12.3 11.5 - 15.5 %   Platelets 142 (L) 150 - 400 K/uL  Basic metabolic panel  Result Value Ref Range   Sodium 134 (L) 135 - 145 mmol/L   Potassium 4.1 3.5 - 5.1 mmol/L   Chloride 102 101 - 111 mmol/L   CO2 22 22 - 32 mmol/L   Glucose, Bld 159 (H) 65 - 99 mg/dL   BUN 24 (H) 6 - 20 mg/dL   Creatinine, Ser 1.76 (H) 0.44 - 1.00 mg/dL   Calcium 8.4 (L) 8.9 - 10.3 mg/dL   GFR calc non Af Amer 27 (L) >60 mL/min   GFR calc Af Amer 31 (L) >60 mL/min   Anion gap 10 5 - 15  CBC  Result Value Ref Range   WBC 11.3 (H) 4.0 - 10.5 K/uL   RBC 2.90 (L) 3.87 - 5.11 MIL/uL   Hemoglobin 8.6 (L) 12.0 - 15.0 g/dL   HCT 26.4 (L)  36.0 - 46.0 %   MCV 91.0 78.0 - 100.0 fL   MCH 29.7 26.0 - 34.0 pg   MCHC 32.6 30.0 - 36.0 g/dL   RDW 12.4 11.5 - 15.5 %   Platelets 154 150 - 400 K/uL  Glucose, capillary  Result Value Ref Range   Glucose-Capillary 138 (H) 65 - 99 mg/dL  Urinalysis, Routine w reflex microscopic (not at [facility])  Result Value Ref Range   Color, Urine YELLOW YELLOW   APPearance CLOUDY (A) CLEAR   Specific Gravity, Urine 1.017 1.005 - 1.030   pH 5.5 5.0 - 8.0   Glucose, UA NEGATIVE NEGATIVE mg/dL   Hgb urine dipstick NEGATIVE NEGATIVE   Bilirubin Urine NEGATIVE NEGATIVE   Ketones, ur NEGATIVE NEGATIVE mg/dL   Protein, ur NEGATIVE NEGATIVE mg/dL   Nitrite NEGATIVE NEGATIVE   Leukocytes, UA NEGATIVE NEGATIVE  Lactic acid, plasma  Result Value Ref Range   Lactic Acid, Venous 0.6 0.5 - 2.0 mmol/L  Lactic acid, plasma  Result Value Ref Range   Lactic  Acid, Venous 0.8 0.5 - 2.0 mmol/L  Procalcitonin - Baseline  Result Value Ref Range   Procalcitonin <0.10 ng/mL  Glucose, capillary  Result Value Ref Range   Glucose-Capillary 122 (H) 65 - 99 mg/dL  Glucose, capillary  Result Value Ref Range   Glucose-Capillary 145 (H) 65 - 99 mg/dL  Glucose, capillary  Result Value Ref Range   Glucose-Capillary 138 (H) 65 - 99 mg/dL  Basic metabolic panel  Result Value Ref Range   Sodium 135 135 - 145 mmol/L   Potassium 4.6 3.5 - 5.1 mmol/L   Chloride 106 101 - 111 mmol/L   CO2 19 (L) 22 - 32 mmol/L   Glucose, Bld 132 (H) 65 - 99 mg/dL   BUN 24 (H) 6 - 20 mg/dL   Creatinine, Ser 1.51 (H) 0.44 - 1.00 mg/dL   Calcium 8.4 (L) 8.9 - 10.3 mg/dL   GFR calc non Af Amer 33 (L) >60 mL/min   GFR calc Af Amer 38 (L) >60 mL/min   Anion gap 10 5 - 15  Glucose, capillary  Result Value Ref Range   Glucose-Capillary 124 (H) 65 - 99 mg/dL  Glucose, capillary  Result Value Ref Range   Glucose-Capillary 122 (H) 65 - 99 mg/dL    Discharge Medications:     Medication List    TAKE these medications        acetaminophen 325 MG tablet  Commonly known as:  TYLENOL  Take 2 tablets (650 mg total) by mouth every 6 (six) hours as needed for mild pain (or Fever >/= 101).     aspirin 81 MG tablet  Take 81 mg by mouth daily.     atorvastatin 20 MG tablet  Commonly known as:  LIPITOR  TAKE 1 TABLET BY MOUTH DAILY.     calcium-vitamin D 500-200 MG-UNIT tablet  Commonly known as:  OSCAL WITH D  Take 1 tablet by mouth 2 (two) times daily.     carvedilol 12.5 MG tablet  Commonly known as:  COREG  TAKE 1 TABLET BY MOUTH 2 TIMES DAILY WITH A MEAL.     cetirizine 10 MG tablet  Commonly known as:  ZYRTEC  Take 10 mg by mouth daily as needed for allergies.     citalopram 40 MG tablet  Commonly known as:  CELEXA  TAKE 1 TABLET BY MOUTH EVERY DAY     diltiazem 240 MG 24 hr capsule  Commonly known  as:  CARDIZEM CD  TAKE ONE CAPSULE BY MOUTH EVERY DAY      ferrous sulfate 325 (65 FE) MG EC tablet  Take 325 mg by mouth 2 (two) times daily.     glipizide-metformin 2.5-250 MG tablet  Commonly known as:  METAGLIP  TAKE 2 TABLETS BY MOUTH TWICE A DAY BEFORE A MEAL     HYDROcodone-acetaminophen 5-325 MG tablet  Commonly known as:  NORCO/VICODIN  Take 1-2 tablets by mouth every 4 (four) hours as needed (mild pain).     losartan-hydrochlorothiazide 100-25 MG tablet  Commonly known as:  HYZAAR  TAKE 1 TABLET BY MOUTH DAILY.     methocarbamol 500 MG tablet  Commonly known as:  ROBAXIN  Take 1 tablet (500 mg total) by mouth every 8 (eight) hours as needed for muscle spasms.     nystatin cream  Commonly known as:  MYCOSTATIN  Apply 1 application topically 2 (two) times daily as needed for dry skin.     omeprazole 20 MG capsule  Commonly known as:  PRILOSEC  TAKE 2 CAPSULES BY MOUTH EVERY DAY     PREVALITE 4 g packet  Generic drug:  cholestyramine light  TAKE 1 PACKET TWICE A DAY WITH A MEAL, TAKE AT LEAST 2 HOURS AWAY FROM OTHER MEDICATIONS.     spironolactone 25 MG tablet  Commonly known as:  ALDACTONE  Take 1 tablet (25 mg total) by mouth daily. NEED OV.        Diagnostic Studies: Dg Lumbar Spine 2-3 Views  11/26/2015  CLINICAL DATA:  Operative imaging for lumbar spine surgery. EXAM: LUMBAR SPINE - 2-3 VIEW COMPARISON:  Lumbar MRI, 11/13/2015 FINDINGS: Single portable view demonstrates to surgical probes. The more superior has its tip projecting 4.8 cm posterior to the mid body of the L4 vertebra. The more inferior has its tip projecting 5.5 cm posterior to the posterior margin of the L5-S1 disc. Mild depression of the upper endplate of L4 is noted stable from the prior lumbar MRI. IMPRESSION: Surgical localization imaging as detailed. Electronically Signed   By: Lajean Manes M.D.   On: 11/26/2015 17:56   Mr Lumbar Spine Wo Contrast  11/14/2015  CLINICAL DATA:  Low back pain for 2-3 years, worse on the left side and radiating into the  posterior aspect of the left leg. Subsequent encounter. EXAM: MRI LUMBAR SPINE WITHOUT CONTRAST TECHNIQUE: Multiplanar, multisequence MR imaging of the lumbar spine was performed. No intravenous contrast was administered. COMPARISON:  MRI lumbar spine 01/25/2015. FINDINGS: Remote, mild inferior endplate compression fracture of L1 and superior endplate compression fractures of L2 and L4 are unchanged. There is no new fracture. Vertebral body alignment is maintained. Scattered degenerative endplate signal change is most notable at L4-5. The conus medullaris is normal in signal and position. T11-12 and T12-L1 are imaged in the sagittal plane only. Small central protrusions at these levels are unchanged. The central canal and foramina appear open. L1-2: No change in very mild disc bulge without central canal or foraminal stenosis. L2-3: Shallow disc bulge and endplate spur without central canal narrowing is unchanged. L3-4: Ligamentum flavum thickening and a disc bulge cause moderate central canal narrowing. Facet degenerative disease is seen. The foramina appear open. The appearance is unchanged. L4-5: Disc bulge, ligamentum flavum thickening and facet arthropathy are unchanged in appearance. Moderately severe central canal stenosis is seen. Severe right and moderately severe left foraminal narrowing is identified. The appearance is unchanged. L5-S1: Bilateral facet degenerative change is worse on  the left. There is some ligamentum flavum thickening. The central canal and foramina are open. IMPRESSION: No change in the appearance of the lumbar spine since the comparison examination. No new abnormality. Spondylosis appearing worst at L4-5 where there is moderately severe central canal stenosis, severe right and moderately severe left foraminal narrowing. Electronically Signed   By: Inge Rise M.D.   On: 11/14/2015 08:03   Dg Chest Port 1 View  11/29/2015  CLINICAL DATA:  Fever. EXAM: PORTABLE CHEST 1 VIEW  COMPARISON:  04/05/2014. FINDINGS: Mediastinum hilar structures are normal. Cardiomegaly with normal pulmonary vascularity. Low lung volumes with mild bibasilar atelectasis and infiltrates. No pleural effusion or pneumothorax IMPRESSION: 1.  Stable cardiomegaly.  No pulmonary venous congestion 2. Low lung volumes with mild bibasilar atelectasis and/or infiltrates. Electronically Signed   By: Marcello Moores  Register   On: 11/29/2015 07:32   Dg Abd Portable 1v  11/29/2015  CLINICAL DATA:  Nausea, vomiting, constipation. EXAM: PORTABLE ABDOMEN - 1 VIEW COMPARISON:  None. FINDINGS: The bowel gas pattern is normal. Phleboliths are noted in the pelvis. Surgical sutures are seen in the right side of the abdomen. IMPRESSION: No evidence of bowel obstruction or ileus. Electronically Signed   By: Marijo Conception, M.D.   On: 11/29/2015 12:17    Disposition: 03-Skilled Nursing Facility      Discharge Instructions    Call MD / Call 911    Complete by:  As directed   If you experience chest pain or shortness of breath, CALL 911 and be transported to the hospital emergency room.  If you develope a fever above 101 F, pus (white drainage) or increased drainage or redness at the wound, or calf pain, call your surgeon's office.     Constipation Prevention    Complete by:  As directed   Drink plenty of fluids.  Prune juice may be helpful.  You may use a stool softener, such as Colace (over the counter) 100 mg twice a day.  Use MiraLax (over the counter) for constipation as needed.     Diet - low sodium heart healthy    Complete by:  As directed      Discharge instructions    Complete by:  As directed   No lifting greater than 10 lbs. Avoid bending, stooping and twisting. Walk in house for first week them may start to get out slowly increasing distance up to one quarter mile by 3 weeks post op. Keep incision dry for 3 days, may use tegaderm or similar water impervious dressing. May shower with dressing and change  dressing following shower. Need to see your primary care physician for blood pressure, anemia and diabetes post surgery. Should see your primary care physician in 1-2 weeks following surgery. Check your blood pressure regularly  And if your systolic blood pressure is less than 115 in the AM hold your hyzaar and spironolactone. May resume your oral antidiabetes medications.     Driving restrictions    Complete by:  As directed   No driving for 4 weeks     Increase activity slowly as tolerated    Complete by:  As directed      Lifting restrictions    Complete by:  As directed   No lifting for 8 weeks           Follow-up Information    Follow up with Nosson Wender E, MD In 2 weeks.   Specialty:  Orthopedic Surgery   Why:  For wound re-check  Contact information:   [place] [address] [phone]       Follow up with [facility].   Specialty:  Home Health Services   Why:  Someone from [facility]  will contact you concerning start date and time for physical therapy.   Contact information:   [place]  [id number] [phone]       Follow up with Arnie Clingenpeel E, MD In 2 weeks.   Specialty:  Orthopedic Surgery   Contact information:   [place] [address] [phone]        Signed: Jessy Oto 11/30/2015, 10:13 AM

## 2015-11-30 NOTE — Progress Notes (Signed)
Pt soap sud enema effective prior to pt discharge and pt voices relief. PDarden Palmer Angelino Rumery Rn

## 2015-12-04 LAB — CULTURE, BLOOD (ROUTINE X 2)
CULTURE: NO GROWTH
Culture: NO GROWTH

## 2015-12-31 ENCOUNTER — Other Ambulatory Visit: Payer: Self-pay | Admitting: Physician Assistant

## 2016-02-04 ENCOUNTER — Telehealth: Payer: Self-pay | Admitting: Gastroenterology

## 2016-02-04 ENCOUNTER — Ambulatory Visit (INDEPENDENT_AMBULATORY_CARE_PROVIDER_SITE_OTHER): Payer: Medicare Other | Admitting: Family

## 2016-02-04 ENCOUNTER — Encounter: Payer: Self-pay | Admitting: Family

## 2016-02-04 VITALS — BP 132/68 | HR 63 | Temp 98.4°F | Ht 65.0 in | Wt 157.1 lb

## 2016-02-04 DIAGNOSIS — R112 Nausea with vomiting, unspecified: Secondary | ICD-10-CM | POA: Diagnosis not present

## 2016-02-04 NOTE — Telephone Encounter (Signed)
Patient on Questran BID since her right-sided hemicolectomy and terminal ileal resection in 2014. She calls because she has had an increase in the urgency and frequency of diarrhea bowel movements. It is not multiple diarrhea stools per day, but multiple days with an urgent incontinent diarrhea stool. She is on Questran BID. She is not taking any Imodium or anything else for her symptoms. There is no pain. She had 1 day of nausea and vomiting that required Phenergan to break the cycle.

## 2016-02-04 NOTE — Patient Instructions (Addendum)
Bland diet.  Plenty of fluids.   If there is no improvement in your symptoms, or if there is any worsening of symptoms, or if you have any additional concerns, please return for re-evaluation; or, if we are closed, consider going to the Emergency Room for evaluation if symptoms urgent.  Gastritis, Adult Gastritis is soreness and swelling (inflammation) of the lining of the stomach. Gastritis can develop as a sudden onset (acute) or long-term (chronic) condition. If gastritis is not treated, it can lead to stomach bleeding and ulcers. CAUSES  Gastritis occurs when the stomach lining is weak or damaged. Digestive juices from the stomach then inflame the weakened stomach lining. The stomach lining may be weak or damaged due to viral or bacterial infections. One common bacterial infection is the Helicobacter pylori infection. Gastritis can also result from excessive alcohol consumption, taking certain medicines, or having too much acid in the stomach.  SYMPTOMS  In some cases, there are no symptoms. When symptoms are present, they may include:  Pain or a burning sensation in the upper abdomen.  Nausea.  Vomiting.  An uncomfortable feeling of fullness after eating. DIAGNOSIS  Your caregiver may suspect you have gastritis based on your symptoms and a physical exam. To determine the cause of your gastritis, your caregiver may perform the following:  Blood or stool tests to check for the H pylori bacterium.  Gastroscopy. A thin, flexible tube (endoscope) is passed down the esophagus and into the stomach. The endoscope has a light and camera on the end. Your caregiver uses the endoscope to view the inside of the stomach.  Taking a tissue sample (biopsy) from the stomach to examine under a microscope. TREATMENT  Depending on the cause of your gastritis, medicines may be prescribed. If you have a bacterial infection, such as an H pylori infection, antibiotics may be given. If your gastritis is  caused by too much acid in the stomach, H2 blockers or antacids may be given. Your caregiver may recommend that you stop taking aspirin, ibuprofen, or other nonsteroidal anti-inflammatory drugs (NSAIDs). HOME CARE INSTRUCTIONS  Only take over-the-counter or prescription medicines as directed by your caregiver.  If you were given antibiotic medicines, take them as directed. Finish them even if you start to feel better.  Drink enough fluids to keep your urine clear or pale yellow.  Avoid foods and drinks that make your symptoms worse, such as:  Caffeine or alcoholic drinks.  Chocolate.  Peppermint or mint flavorings.  Garlic and onions.  Spicy foods.  Citrus fruits, such as oranges, lemons, or limes.  Tomato-based foods such as sauce, chili, salsa, and pizza.  Fried and fatty foods.  Eat small, frequent meals instead of large meals. SEEK IMMEDIATE MEDICAL CARE IF:   You have black or dark red stools.  You vomit blood or material that looks like coffee grounds.  You are unable to keep fluids down.  Your abdominal pain gets worse.  You have a fever.  You do not feel better after 1 week.  You have any other questions or concerns. MAKE SURE YOU:  Understand these instructions.  Will watch your condition.  Will get help right away if you are not doing well or get worse.   This information is not intended to replace advice given to you by your health care provider. Make sure you discuss any questions you have with your health care provider.   Document Released: 09/08/2001 Document Revised: 03/15/2012 Document Reviewed: 10/28/2011 Elsevier Interactive Patient Education 2016  [employer].

## 2016-02-04 NOTE — Progress Notes (Signed)
Subjective:    Patient ID: Tonya [name], female    DOB: 06/20/1941, 75 y.o.   MRN: [id number]   Tonya [name] is a 75 y.o. female who presents today for an acute visit.    HPI Comments: Resolved this morning, ate eggs and tea and feels hungry.  H/o colon cancer; takes prevalite daily to control chronic diarrhea. Follows with Dr. Ardis Hughs, GI. Due for f/u.   Emesis  This is a new problem. The current episode started yesterday. The problem occurs more than 10 times per day. The problem has been resolved. The emesis has an appearance of bile and stomach contents. There has been no fever. Associated symptoms include diarrhea and headaches (chronic). Pertinent negatives include no abdominal pain, arthralgias, chest pain, chills, coughing, dizziness, fever, myalgias, sweats or URI. Risk factors: none. Treatments tried: phenergan. The treatment provided moderate relief.   Past Medical History  Diagnosis Date  . Osteoarthritis   . Edema, peripheral   . CAD (coronary artery disease)     s/p BMS to LAD  . Breast pain     left  . Hyperlipidemia   . Hypertension   . Adenocarcinoma, colon ([place])   . Sciatica   . Chronic diarrhea   . GERD (gastroesophageal reflux disease)   . Depression   . Anxiety   . Osteoarthritis   . IBS (irritable bowel syndrome)   . Degenerative disk disease   . Carpal tunnel syndrome   . Diabetes mellitus, type 2 ([place])   . History of pneumonia   . Nocturia   . Fall at home 03/16/2014    no fx; multiple facial lacerations, abrasions and hematoma  . Dysrhythmia     h/o AFIB  . S/P coronary artery stent placement 2007  . Anemia     iron deficiency  . Colon polyps     Tubular adenoma  . Headache    Allergies: Amoxicillin-pot clavulanate Current Outpatient Prescriptions on File Prior to Visit  Medication Sig Dispense Refill  . acetaminophen (TYLENOL) 325 MG tablet Take 2 tablets (650 mg total) by mouth every 6 (six) hours as needed for mild pain (or Fever  >/= 101).    Marland Kitchen aspirin 81 MG tablet Take 81 mg by mouth daily.    Marland Kitchen atorvastatin (LIPITOR) 20 MG tablet TAKE 1 TABLET BY MOUTH DAILY. 90 tablet 2  . calcium-vitamin D (OSCAL WITH D 500-200) 500-200 MG-UNIT per tablet Take 1 tablet by mouth 2 (two) times daily.      . carvedilol (COREG) 12.5 MG tablet TAKE 1 TABLET BY MOUTH 2 TIMES DAILY WITH A MEAL. 180 tablet 3  . cetirizine (ZYRTEC) 10 MG tablet Take 10 mg by mouth daily as needed for allergies.    . citalopram (CELEXA) 40 MG tablet TAKE 1 TABLET BY MOUTH EVERY DAY 90 tablet 1  . diltiazem (CARDIZEM CD) 240 MG 24 hr capsule TAKE ONE CAPSULE BY MOUTH EVERY DAY 90 capsule 3  . ferrous sulfate 325 (65 FE) MG EC tablet Take 325 mg by mouth 2 (two) times daily.    Marland Kitchen glipizide-metformin (METAGLIP) 2.5-250 MG per tablet TAKE 2 TABLETS BY MOUTH TWICE A DAY BEFORE A MEAL 360 tablet 2  . losartan-hydrochlorothiazide (HYZAAR) 100-25 MG per tablet TAKE 1 TABLET BY MOUTH DAILY. 90 tablet 3  . nystatin cream (MYCOSTATIN) Apply 1 application topically 2 (two) times daily as needed for dry skin. 30 g 1  . omeprazole (PRILOSEC) 20 MG capsule TAKE 2 CAPSULES BY  MOUTH EVERY DAY (Patient taking differently: TAKE 1 CAPSULES BY MOUTH EVERY DAY) 180 capsule 3  . PREVALITE 4 g packet TAKE 1 PACKET TWICE A DAY WITH A MEAL, TAKE AT LEAST 2 HOURS AWAY FROM OTHER MEDICATIONS. 60 packet 1  . spironolactone (ALDACTONE) 25 MG tablet Take 1 tablet (25 mg total) by mouth daily. NEED OV. 90 tablet 0  . HYDROcodone-acetaminophen (NORCO/VICODIN) 5-325 MG tablet Take 1-2 tablets by mouth every 4 (four) hours as needed (mild pain). (Patient not taking: Reported on 02/04/2016) 70 tablet 0  . methocarbamol (ROBAXIN) 500 MG tablet Take 1 tablet (500 mg total) by mouth every 8 (eight) hours as needed for muscle spasms. (Patient not taking: Reported on 02/04/2016) 40 tablet 1   No current facility-administered medications on file prior to visit.    Social History  Substance Use Topics  .  Smoking status: Never Smoker   . Smokeless tobacco: Never Used  . Alcohol Use: No    Review of Systems  Constitutional: Negative for fever and chills.  Respiratory: Negative for cough.   Cardiovascular: Negative for chest pain and palpitations.  Gastrointestinal: Positive for vomiting and diarrhea. Negative for abdominal pain.  Musculoskeletal: Negative for myalgias and arthralgias.  Neurological: Positive for headaches (chronic). Negative for dizziness.      Objective:    BP 132/68 mmHg  Pulse 63  Temp(Src) 98.4 F (36.9 C) (Oral)  Ht 5\' 5"  (1.651 m)  Wt 157 lb 2 oz (71.271 kg)  BMI 26.15 kg/m2  SpO2 98%   Physical Exam  Constitutional: She appears well-developed and well-nourished.  Eyes: Conjunctivae are normal.  Cardiovascular: Normal rate, regular rhythm, normal heart sounds and normal pulses.   Pulmonary/Chest: Effort normal and breath sounds normal. She has no wheezes. She has no rhonchi. She has no rales.  Abdominal: Soft. Normal appearance. She exhibits no distension, no ascites and no mass. Bowel sounds are increased. There is no tenderness. There is no rigidity, no rebound and no guarding.  Neurological: She is alert.  Skin: Skin is warm and dry.  Psychiatric: She has a normal mood and affect. Her speech is normal and behavior is normal. Thought content normal.  Vitals reviewed.      Assessment & Plan:   1. Non-intractable vomiting with nausea, vomiting of unspecified type Working diagnosis of viral gastritis. Non bloody. No known food cause.  Emesis has resolved and patient tolerated a meal this morning. She does not have an acute abdomen and is well-appearing, hemodynamically stable. Patient and I agreed on conservative therapy with close observation and introduction of bland diet.   I am having Ms. Weekley maintain her calcium-vitamin D, ferrous sulfate, aspirin, cetirizine, acetaminophen, nystatin cream, losartan-hydrochlorothiazide, omeprazole, diltiazem,  glipizide-metformin, atorvastatin, citalopram, carvedilol, spironolactone, HYDROcodone-acetaminophen, methocarbamol, and PREVALITE.   No orders of the defined types were placed in this encounter.     Start medications as prescribed and explained to patient on After Visit Summary ( AVS). Risks, benefits, and alternatives of the medications and treatment plan prescribed today were discussed, and patient expressed understanding.   Education regarding symptom management and diagnosis given to patient.   Follow-up:Plan follow-up as discussed or as needed if any worsening symptoms or change in condition. No Follow-up on file.   Continue to follow with [name], MD for routine health maintenance.   [name] and I agreed with plan.   Mable Paris, FNP

## 2016-02-04 NOTE — Telephone Encounter (Signed)
Ok to add imodium.  One pill once daily in AM.  OK to take another pill later in the morning if needed.  Should continue cholesyramine twice daily.

## 2016-02-04 NOTE — Progress Notes (Signed)
Pre visit review using our clinic review tool, if applicable. No additional management support is needed unless otherwise documented below in the visit note. 

## 2016-02-05 NOTE — Telephone Encounter (Signed)
Advised 

## 2016-02-11 DIAGNOSIS — Z961 Presence of intraocular lens: Secondary | ICD-10-CM | POA: Diagnosis not present

## 2016-02-11 DIAGNOSIS — H26493 Other secondary cataract, bilateral: Secondary | ICD-10-CM | POA: Diagnosis not present

## 2016-02-11 DIAGNOSIS — E119 Type 2 diabetes mellitus without complications: Secondary | ICD-10-CM | POA: Diagnosis not present

## 2016-02-11 DIAGNOSIS — H5021 Vertical strabismus, right eye: Secondary | ICD-10-CM | POA: Diagnosis not present

## 2016-02-11 LAB — HM DIABETES EYE EXAM

## 2016-02-25 ENCOUNTER — Other Ambulatory Visit: Payer: Self-pay | Admitting: Cardiology

## 2016-02-25 NOTE — Telephone Encounter (Signed)
Rx request sent to pharmacy.  

## 2016-03-06 ENCOUNTER — Encounter: Payer: Self-pay | Admitting: Family Medicine

## 2016-03-11 ENCOUNTER — Other Ambulatory Visit: Payer: Self-pay | Admitting: Family Medicine

## 2016-03-17 ENCOUNTER — Other Ambulatory Visit: Payer: Self-pay | Admitting: Physician Assistant

## 2016-03-18 ENCOUNTER — Other Ambulatory Visit: Payer: Self-pay | Admitting: Family Medicine

## 2016-03-26 DIAGNOSIS — M5416 Radiculopathy, lumbar region: Secondary | ICD-10-CM | POA: Diagnosis not present

## 2016-03-26 DIAGNOSIS — M4806 Spinal stenosis, lumbar region: Secondary | ICD-10-CM | POA: Diagnosis not present

## 2016-03-26 DIAGNOSIS — M461 Sacroiliitis, not elsewhere classified: Secondary | ICD-10-CM | POA: Diagnosis not present

## 2016-04-13 DIAGNOSIS — M461 Sacroiliitis, not elsewhere classified: Secondary | ICD-10-CM | POA: Diagnosis not present

## 2016-04-21 ENCOUNTER — Encounter: Payer: Self-pay | Admitting: Gastroenterology

## 2016-04-21 ENCOUNTER — Ambulatory Visit (INDEPENDENT_AMBULATORY_CARE_PROVIDER_SITE_OTHER): Payer: Medicare Other | Admitting: Gastroenterology

## 2016-04-21 ENCOUNTER — Telehealth: Payer: Self-pay | Admitting: Family Medicine

## 2016-04-21 ENCOUNTER — Other Ambulatory Visit (INDEPENDENT_AMBULATORY_CARE_PROVIDER_SITE_OTHER): Payer: Medicare Other

## 2016-04-21 VITALS — BP 148/60 | HR 60 | Ht 65.25 in | Wt 154.5 lb

## 2016-04-21 DIAGNOSIS — R197 Diarrhea, unspecified: Secondary | ICD-10-CM | POA: Diagnosis not present

## 2016-04-21 DIAGNOSIS — E785 Hyperlipidemia, unspecified: Secondary | ICD-10-CM | POA: Diagnosis not present

## 2016-04-21 DIAGNOSIS — Z85038 Personal history of other malignant neoplasm of large intestine: Secondary | ICD-10-CM | POA: Diagnosis not present

## 2016-04-21 DIAGNOSIS — E119 Type 2 diabetes mellitus without complications: Secondary | ICD-10-CM | POA: Diagnosis not present

## 2016-04-21 DIAGNOSIS — R634 Abnormal weight loss: Secondary | ICD-10-CM | POA: Diagnosis not present

## 2016-04-21 LAB — COMPREHENSIVE METABOLIC PANEL
ALBUMIN: 4.4 g/dL (ref 3.5–5.2)
ALK PHOS: 76 U/L (ref 39–117)
ALT: 17 U/L (ref 0–35)
AST: 20 U/L (ref 0–37)
BUN: 45 mg/dL — AB (ref 6–23)
CALCIUM: 9.7 mg/dL (ref 8.4–10.5)
CHLORIDE: 110 meq/L (ref 96–112)
CO2: 22 mEq/L (ref 19–32)
Creatinine, Ser: 1.68 mg/dL — ABNORMAL HIGH (ref 0.40–1.20)
GFR: 31.48 mL/min — AB (ref >60.00)
Glucose, Bld: 91 mg/dL (ref 70–99)
POTASSIUM: 4.8 meq/L (ref 3.5–5.1)
SODIUM: 139 meq/L (ref 135–145)
Total Bilirubin: 0.7 mg/dL (ref 0.2–1.2)
Total Protein: 7.1 g/dL (ref 6.0–8.3)

## 2016-04-21 LAB — LIPID PANEL
CHOLESTEROL: 88 mg/dL (ref 0–200)
HDL: 36.4 mg/dL — AB (ref >39.00)
LDL CALC: 22 mg/dL (ref 0–99)
NonHDL: 51.82
TRIGLYCERIDES: 149 mg/dL (ref 0.0–149.0)
Total CHOL/HDL Ratio: 2
VLDL: 29.8 mg/dL (ref 0.0–40.0)

## 2016-04-21 LAB — HEMOGLOBIN A1C: Hgb A1c MFr Bld: 5.4 % (ref 4.6–6.5)

## 2016-04-21 NOTE — Telephone Encounter (Signed)
-----   Message from Marchia Bond sent at 04/16/2016  9:58 AM EDT ----- Regarding: Dm f/u labs Tues 7/25 need orders, thanks! :-) Please order future dm f/u labs for pt's upcoming lab appt. Thanks Aniceto Boss

## 2016-04-21 NOTE — Progress Notes (Signed)
Review of pertinent gastrointestinal problems: 1. Colon cancer: early stage synchronous right-sided colon cancers diagnosed and resected in March 2010. She did not require any adjuvant chemotherapy. She has had serial interval follow-up and last colonoscopy was done in March 2014. She had 3 sessile polyps removed which were tubular adenomas and she was recommended for 5 year interval follow-up. 2. Chronic loose stools since cancer surgery; responded well to cholestyramine  HPI: This is a    very pleasant 75 year old woman whom I last saw 3-1/2 years ago the time of a surveillance colonoscopy.  She eats well but she has been losing weight.    Chronic diarrhea.  She takes prevalite twice daily.  Has been on this since her colon cancer surgery. This is very helpful usually.  Diarrhea is treated very well with the medicine.  Chief complaint is  weight loss.  Has lost 30 pounds in 6-8 months.   Past Medical History:  Diagnosis Date  . Adenocarcinoma, colon ([place])   . Anemia    iron deficiency  . Anxiety   . Breast pain    left  . CAD (coronary artery disease)    s/p BMS to LAD  . Carpal tunnel syndrome   . Chronic diarrhea   . Colon polyps    Tubular adenoma  . Degenerative disk disease   . Depression   . Diabetes mellitus, type 2 ([place])   . Dysrhythmia    h/o AFIB  . Edema, peripheral   . Fall at home 03/16/2014   no fx; multiple facial lacerations, abrasions and hematoma  . GERD (gastroesophageal reflux disease)   . Headache   . History of pneumonia   . Hyperlipidemia   . Hypertension   . IBS (irritable bowel syndrome)   . Nocturia   . Osteoarthritis   . Osteoarthritis   . S/P coronary artery stent placement 2007  . Sciatica     Past Surgical History:  Procedure Laterality Date  . CARDIAC CATHETERIZATION  12/10   LAD stent patent. Insignificant CAD, otherwise EF 60-65%  . CARPAL TUNNEL RELEASE    . CATARACT EXTRACTION  03/2015  . CHOLECYSTECTOMY    . DILATION AND  CURETTAGE OF UTERUS    . HEMATOMA EVACUATION     after knee replacement  . KNEE ARTHROSCOPY     bilateral  . LUMBAR LAMINECTOMY N/A 11/26/2015   Procedure: LUMBAR DECOMPRESSIVE LAMINECTOMY L3-4 AND L4-5, LEFT L2-3 HEMILAMINECTOMY;  Surgeon: Jessy Oto, MD;  Location: [facility];  Service: Orthopedics;  Laterality: N/A;  . PARTIAL COLECTOMY  11/2008   right, for adenocarcinoma  . REPLACEMENT TOTAL KNEE  99991111   right, complicated by hemarthrosis   . TONSILLECTOMY    . TOTAL KNEE ARTHROPLASTY Left 04/03/2014   dr whitfield  . TOTAL KNEE ARTHROPLASTY Left 04/03/2014   Procedure: TOTAL KNEE ARTHROPLASTY;  Surgeon: Garald Balding, MD;  Location: [facility];  Service: Orthopedics;  Laterality: Left;  . TOTAL KNEE REVISION  03/08/2012   Procedure: TOTAL KNEE REVISION;  Surgeon: Garald Balding, MD;  Location: [facility];  Service: Orthopedics;  Laterality: Right;  removal total knee hardware and placement of antibiotic cement spacer and antibiotic beads  . TOTAL KNEE REVISION  05/17/2012   Procedure: TOTAL KNEE REVISION;  Surgeon: Garald Balding, MD;  Location: [facility];  Service: Orthopedics;  Laterality: Right;  right total knee revision, removal of antibiotic spacer    Current Outpatient Prescriptions  Medication Sig Dispense Refill  . acetaminophen (TYLENOL)  325 MG tablet Take 2 tablets (650 mg total) by mouth every 6 (six) hours as needed for mild pain (or Fever >/= 101).    Marland Kitchen aspirin 81 MG tablet Take 81 mg by mouth daily.    Marland Kitchen atorvastatin (LIPITOR) 20 MG tablet TAKE 1 TABLET BY MOUTH DAILY. 90 tablet 2  . calcium-vitamin D (OSCAL WITH D 500-200) 500-200 MG-UNIT per tablet Take 1 tablet by mouth 2 (two) times daily.      . carvedilol (COREG) 12.5 MG tablet TAKE 1 TABLET BY MOUTH 2 TIMES DAILY WITH A MEAL. 180 tablet 3  . cetirizine (ZYRTEC) 10 MG tablet Take 10 mg by mouth daily as needed for allergies.    . citalopram (CELEXA) 20 MG tablet TAKE 1 TABLET (20 MG TOTAL) BY MOUTH DAILY. 90 tablet 1  .  diltiazem (CARDIZEM CD) 240 MG 24 hr capsule TAKE ONE CAPSULE BY MOUTH EVERY DAY 90 capsule 3  . ferrous sulfate 325 (65 FE) MG EC tablet Take 325 mg by mouth 2 (two) times daily.    Marland Kitchen glipizide-metformin (METAGLIP) 2.5-250 MG tablet TAKE 2 TABLETS BY MOUTH TWICE A DAY BEFORE A MEAL 360 tablet 1  . losartan-hydrochlorothiazide (HYZAAR) 100-25 MG per tablet TAKE 1 TABLET BY MOUTH DAILY. 90 tablet 3  . nystatin cream (MYCOSTATIN) Apply 1 application topically 2 (two) times daily as needed for dry skin. 30 g 1  . omeprazole (PRILOSEC) 20 MG capsule TAKE 2 CAPSULES BY MOUTH EVERY DAY (Patient taking differently: TAKE 1 CAPSULES BY MOUTH EVERY DAY) 180 capsule 3  . PREVALITE 4 g packet TAKE 1 PACKET TWICE A DAY WITH A MEAL, TAKE AT LEAST 2 HOURS AWAY FROM OTHER MEDICATIONS. 60 packet 1  . spironolactone (ALDACTONE) 25 MG tablet TAKE 1 TABLET (25 MG TOTAL) BY MOUTH DAILY. 90 tablet 1   No current facility-administered medications for this visit.     Allergies as of 04/21/2016 - Review Complete 04/21/2016  Allergen Reaction Noted  . Amoxicillin-pot clavulanate Swelling     Family History  Problem Relation Age of Onset  . Heart disease Mother     massive MI age 16  . Heart attack Mother   . Heart disease Father     Massive MI age 51  . Prostate cancer Father   . Colon cancer Neg Hx     Social History   Social History  . Marital status: Divorced    Spouse name: N/A  . Number of children: 3  . Years of education: N/A   Occupational History  . retired   .  Retired   Social History Main Topics  . Smoking status: Never Smoker  . Smokeless tobacco: Never Used  . Alcohol use No  . Drug use: No  . Sexual activity: No   Other Topics Concern  . Not on file   Social History Narrative   No regular exercise, limited due to knees   Diet- addicted to sweets, some fruit and veggies, water daily.     Physical Exam: BP (!) 148/60 (BP Location: Left Arm, Patient Position: Sitting, Cuff  Size: Normal)   Pulse 60   Ht 5' 5.25" (1.657 m) Comment: heght measured without shoes  Wt 154 lb 8 oz (70.1 kg)   BMI 25.51 kg/m  Constitutional: generally well-appearing Psychiatric: alert and oriented x3 Abdomen: soft, nontender, nondistended, no obvious ascites, no peritoneal signs, normal bowel sounds   Assessment and plan: 75 y.o. female with  Personal history of colon cancer,  synchronous lesions. Now with weight loss unexplained her bile acid related diarrhea is under good control as long she takes cholestyramine twice daily. If she misses even one dose she will have diarrhea that day. I explained to her the nature of this diarrhea. Overall her diarrhea has been under very good control since she does not generally miss any doses of the cholestyramine. I'm concerned however because she has lost about 30 pounds in the last 6 months. She has a history of colon cancer and although is very early stage when it was caught I wonder about recurrence now. I'm going to get a CT scan abdomen and pelvis for her weight loss and her history of colon cancer. If this is normal then I would like to proceed with repeat colonoscopy. She was already scheduled to have this done in 2019 for routine surveillance, screening.   Owens Loffler, MD [facility] 04/21/2016, 9:47 AM

## 2016-04-21 NOTE — Patient Instructions (Addendum)
You will be set up for a CT scan of abdomen and pelvis with IV and oral contrast for diarrhea, weight loss, history of colon cancer.  You have been scheduled for a CT scan of the abdomen and pelvis at [facility] ([address]---this is in the same building as Press [facility]).   You are scheduled on 04/23/16 at 1 pm. You should arrive 15 minutes prior to your appointment time for registration. Please follow the written instructions below on the day of your exam:  WARNING: IF YOU ARE ALLERGIC TO IODINE/X-RAY DYE, PLEASE NOTIFY RADIOLOGY IMMEDIATELY AT [phone]! YOU WILL BE GIVEN A 13 HOUR PREMEDICATION PREP.  1) Do not eat or drink anything after 9 am (4 hours prior to your test) 2) You have been given 2 bottles of oral contrast to drink. The solution may taste               better if refrigerated, but do NOT add ice or any other liquid to this solution. Shake             well before drinking.    Drink 1 bottle of contrast @ 7 am (2 hours prior to your exam)  Drink 1 bottle of contrast @ 8 am (1 hour prior to your exam)  You may take any medications as prescribed with a small amount of water except for the following: Metformin, Glucophage, Glucovance, Avandamet, Riomet, Fortamet, Actoplus Met, Janumet, Glumetza or Metaglip. The above medications must be held the day of the exam AND 48 hours after the exam.  The purpose of you drinking the oral contrast is to aid in the visualization of your intestinal tract. The contrast solution may cause some diarrhea. Before your exam is started, you will be given a small amount of fluid to drink. Depending on your individual set of symptoms, you may also receive an intravenous injection of x-ray contrast/dye. Plan on being at [facility] for 30 minutes or longer, depending on the type of exam you are having performed.  This test typically takes 30-45 minutes to complete.  If you have any questions regarding your exam or if you need  to reschedule, you may call the CT department at [phone] between the hours of 8:00 am and 5:00 pm, Monday-Friday.  ________________________________________________________________________  If this is unrevealing, then you will need repeat colonoscopy.

## 2016-04-23 ENCOUNTER — Ambulatory Visit (INDEPENDENT_AMBULATORY_CARE_PROVIDER_SITE_OTHER)
Admission: RE | Admit: 2016-04-23 | Discharge: 2016-04-23 | Disposition: A | Discharge status: Home / Self Care | Payer: Medicare Other | Source: Ambulatory Visit | Attending: Gastroenterology | Admitting: Gastroenterology

## 2016-04-23 DIAGNOSIS — R197 Diarrhea, unspecified: Secondary | ICD-10-CM | POA: Diagnosis not present

## 2016-04-23 DIAGNOSIS — N200 Calculus of kidney: Secondary | ICD-10-CM | POA: Diagnosis not present

## 2016-04-23 DIAGNOSIS — Z85038 Personal history of other malignant neoplasm of large intestine: Secondary | ICD-10-CM | POA: Diagnosis not present

## 2016-04-23 DIAGNOSIS — R634 Abnormal weight loss: Secondary | ICD-10-CM | POA: Diagnosis not present

## 2016-04-24 ENCOUNTER — Telehealth: Payer: Self-pay | Admitting: Gastroenterology

## 2016-04-24 NOTE — Telephone Encounter (Signed)
Patient calling for CT results. Please, advise.

## 2016-04-27 ENCOUNTER — Encounter: Payer: Self-pay | Admitting: Emergency Medicine

## 2016-04-27 ENCOUNTER — Emergency Department
Admission: EM | Admit: 2016-04-27 | Discharge: 2016-04-27 | Disposition: A | Discharge status: Home / Self Care | Payer: Medicare Other | Attending: Emergency Medicine | Admitting: Emergency Medicine

## 2016-04-27 ENCOUNTER — Emergency Department: Payer: Medicare Other

## 2016-04-27 DIAGNOSIS — Y929 Unspecified place or not applicable: Secondary | ICD-10-CM | POA: Diagnosis not present

## 2016-04-27 DIAGNOSIS — Z7984 Long term (current) use of oral hypoglycemic drugs: Secondary | ICD-10-CM | POA: Diagnosis not present

## 2016-04-27 DIAGNOSIS — E1122 Type 2 diabetes mellitus with diabetic chronic kidney disease: Secondary | ICD-10-CM | POA: Diagnosis not present

## 2016-04-27 DIAGNOSIS — W010XXA Fall on same level from slipping, tripping and stumbling without subsequent striking against object, initial encounter: Secondary | ICD-10-CM | POA: Diagnosis not present

## 2016-04-27 DIAGNOSIS — Y999 Unspecified external cause status: Secondary | ICD-10-CM | POA: Insufficient documentation

## 2016-04-27 DIAGNOSIS — I129 Hypertensive chronic kidney disease with stage 1 through stage 4 chronic kidney disease, or unspecified chronic kidney disease: Secondary | ICD-10-CM | POA: Diagnosis not present

## 2016-04-27 DIAGNOSIS — Z79899 Other long term (current) drug therapy: Secondary | ICD-10-CM | POA: Diagnosis not present

## 2016-04-27 DIAGNOSIS — N183 Chronic kidney disease, stage 3 (moderate): Secondary | ICD-10-CM | POA: Insufficient documentation

## 2016-04-27 DIAGNOSIS — Z7982 Long term (current) use of aspirin: Secondary | ICD-10-CM | POA: Diagnosis not present

## 2016-04-27 DIAGNOSIS — E785 Hyperlipidemia, unspecified: Secondary | ICD-10-CM | POA: Diagnosis not present

## 2016-04-27 DIAGNOSIS — Y9389 Activity, other specified: Secondary | ICD-10-CM | POA: Insufficient documentation

## 2016-04-27 DIAGNOSIS — S42301A Unspecified fracture of shaft of humerus, right arm, initial encounter for closed fracture: Secondary | ICD-10-CM | POA: Diagnosis not present

## 2016-04-27 DIAGNOSIS — M25511 Pain in right shoulder: Secondary | ICD-10-CM | POA: Diagnosis present

## 2016-04-27 DIAGNOSIS — I2510 Atherosclerotic heart disease of native coronary artery without angina pectoris: Secondary | ICD-10-CM | POA: Diagnosis not present

## 2016-04-27 DIAGNOSIS — S42291A Other displaced fracture of upper end of right humerus, initial encounter for closed fracture: Secondary | ICD-10-CM | POA: Diagnosis not present

## 2016-04-27 MED ORDER — OXYCODONE-ACETAMINOPHEN 5-325 MG PO TABS: 1.0000 | ORAL_TABLET | ORAL | 0 refills | Status: DC | PRN

## 2016-04-27 MED ORDER — ACETAMINOPHEN-CODEINE #3 300-30 MG PO TABS
1.0000 | ORAL_TABLET | Freq: Once | ORAL | Status: DC
  Filled 2016-04-27: qty 1

## 2016-04-27 MED ORDER — ACETAMINOPHEN-CODEINE #3 300-30 MG PO TABS: 1.0000 | ORAL_TABLET | ORAL | 0 refills | Status: DC | PRN

## 2016-04-27 MED ORDER — OXYCODONE-ACETAMINOPHEN 5-325 MG PO TABS
ORAL_TABLET | ORAL | Status: AC
  Administered 2016-04-27: 1 via ORAL
  Filled 2016-04-27: qty 1

## 2016-04-27 MED ORDER — OXYCODONE-ACETAMINOPHEN 5-325 MG PO TABS
1.0000 | ORAL_TABLET | Freq: Once | ORAL | Status: AC
  Administered 2016-04-27: 1 via ORAL

## 2016-04-27 NOTE — ED Provider Notes (Signed)
[facility] Emergency Department Provider Note  ____________________________________________  Time seen: Approximately 11:46 AM  I have reviewed the triage vital signs and the nursing notes.   HISTORY  Chief Complaint Fall and Shoulder Pain    HPI Tonya [name] is a 75 y.o. female who presents for evaluation of right shoulder pain. Patient states that she tripped this morning while trying to pick up some toys. Describes any dizziness lightheadedness or head injury. Denies using any anticoagulants. Patient states that typically she does have a low pulse which is 44 now. Her pain is 10 over 10 nonradiating.    Past Medical History:  Diagnosis Date  . Adenocarcinoma, colon ([place])   . Anemia    iron deficiency  . Anxiety   . Breast pain    left  . CAD (coronary artery disease)    s/p BMS to LAD  . Carpal tunnel syndrome   . Chronic diarrhea   . Colon polyps    Tubular adenoma  . Degenerative disk disease   . Depression   . Diabetes mellitus, type 2 ([place])   . Dysrhythmia    h/o AFIB  . Edema, peripheral   . Fall at home 03/16/2014   no fx; multiple facial lacerations, abrasions and hematoma  . GERD (gastroesophageal reflux disease)   . Headache   . History of pneumonia   . Hyperlipidemia   . Hypertension   . IBS (irritable bowel syndrome)   . Nocturia   . Osteoarthritis   . Osteoarthritis   . S/P coronary artery stent placement 2007  . Sciatica     Patient Active Problem List   Diagnosis Date Noted  . Anemia 11/29/2015  . Fever   . Surgery, elective   . Spinal stenosis, lumbar region, with neurogenic claudication 11/26/2015    Class: Chronic  . Mixed incontinence urge and stress 10/29/2015  . CKD (chronic kidney disease) stage 3, GFR 30-59 ml/min 06/25/2015  . Hyperkalemia 04/25/2015  . Counseling regarding end of life decision making 10/26/2014  . S/P total knee replacement using cement 04/03/2014  . Intertrigo 04/25/2013  .  Urinary urgency 10/18/2012  . ATRIAL FIBRILLATION, PAROXYSMAL 10/17/2010  . ALLERGIC RHINITIS 01/23/2009  . ADENOCARCINOMA, COLON, CECUM 10/05/2008  . Coronary atherosclerosis 11/23/2007  . Hyperlipidemia 09/19/2007  . ANXIETY 09/19/2007  . Depression 09/19/2007  . GERD 09/19/2007  . Diabetes mellitus with no complication ([place]) [id number]  . SYNDROME, CARPAL TUNNEL 03/21/2007  . Essential hypertension, benign 03/21/2007  . IBS 03/21/2007  . Osteoarthrosis, unspecified whether generalized or localized, unspecified site 03/21/2007    Past Surgical History:  Procedure Laterality Date  . CARDIAC CATHETERIZATION  12/10   LAD stent patent. Insignificant CAD, otherwise EF 60-65%  . CARPAL TUNNEL RELEASE    . CATARACT EXTRACTION  03/2015  . CHOLECYSTECTOMY    . DILATION AND CURETTAGE OF UTERUS    . HEMATOMA EVACUATION     after knee replacement  . KNEE ARTHROSCOPY     bilateral  . LUMBAR LAMINECTOMY N/A 11/26/2015   Procedure: LUMBAR DECOMPRESSIVE LAMINECTOMY L3-4 AND L4-5, LEFT L2-3 HEMILAMINECTOMY;  Surgeon: Jessy Oto, MD;  Location: [place];  Service: Orthopedics;  Laterality: N/A;  . PARTIAL COLECTOMY  11/2008   right, for adenocarcinoma  . REPLACEMENT TOTAL KNEE  99991111   right, complicated by hemarthrosis   . TONSILLECTOMY    . TOTAL KNEE ARTHROPLASTY Left 04/03/2014   dr whitfield  . TOTAL KNEE ARTHROPLASTY Left 04/03/2014   Procedure:  TOTAL KNEE ARTHROPLASTY;  Surgeon: Garald Balding, MD;  Location: [place];  Service: Orthopedics;  Laterality: Left;  . TOTAL KNEE REVISION  03/08/2012   Procedure: TOTAL KNEE REVISION;  Surgeon: Garald Balding, MD;  Location: [place];  Service: Orthopedics;  Laterality: Right;  removal total knee hardware and placement of antibiotic cement spacer and antibiotic beads  . TOTAL KNEE REVISION  05/17/2012   Procedure: TOTAL KNEE REVISION;  Surgeon: Garald Balding, MD;  Location: [place];  Service: Orthopedics;  Laterality: Right;  right total knee  revision, removal of antibiotic spacer    Prior to Admission medications   Medication Sig Start Date End Date Taking? Authorizing Provider  aspirin 81 MG tablet Take 81 mg by mouth daily.    Historical Provider, MD  atorvastatin (LIPITOR) 20 MG tablet TAKE 1 TABLET BY MOUTH DAILY. 10/21/15   Minus Breeding, MD  calcium-vitamin D (OSCAL WITH D 500-200) 500-200 MG-UNIT per tablet Take 1 tablet by mouth 2 (two) times daily.      Historical Provider, MD  carvedilol (COREG) 12.5 MG tablet TAKE 1 TABLET BY MOUTH 2 TIMES DAILY WITH A MEAL. 11/28/15   Amy Cletis Athens, MD  cetirizine (ZYRTEC) 10 MG tablet Take 10 mg by mouth daily as needed for allergies.    Historical Provider, MD  citalopram (CELEXA) 20 MG tablet TAKE 1 TABLET (20 MG TOTAL) BY MOUTH DAILY. 03/11/16   Amy Cletis Athens, MD  diltiazem (CARDIZEM CD) 240 MG 24 hr capsule TAKE ONE CAPSULE BY MOUTH EVERY DAY 06/14/15   Amy Cletis [place], MD  ferrous sulfate 325 (65 FE) MG EC tablet Take 325 mg by mouth 2 (two) times daily.    Amy E Diona Browner, MD  glipizide-metformin (METAGLIP) 2.5-250 MG tablet TAKE 2 TABLETS BY MOUTH TWICE A DAY BEFORE A MEAL 03/18/16   Amy E Diona Browner, MD  losartan-hydrochlorothiazide (HYZAAR) 100-25 MG per tablet TAKE 1 TABLET BY MOUTH DAILY. 06/05/15   Minus Breeding, MD  nystatin cream (MYCOSTATIN) Apply 1 application topically 2 (two) times daily as needed for dry skin. 04/25/15   Amy Cletis Athens, MD  omeprazole (PRILOSEC) 20 MG capsule TAKE 2 CAPSULES BY MOUTH EVERY DAY Patient taking differently: TAKE 1 CAPSULES BY MOUTH EVERY DAY 06/14/15   Amy Cletis Athens, MD  oxyCODONE-acetaminophen (ROXICET) 5-325 MG tablet Take 1-2 tablets by mouth every 4 (four) hours as needed for severe pain. 04/27/16   Pierce Crane Sharlet Notaro, PA-C  PREVALITE 4 g packet TAKE 1 PACKET TWICE A DAY WITH A MEAL, TAKE AT LEAST 2 HOURS AWAY FROM OTHER MEDICATIONS. 03/17/16   Milus Banister, MD  spironolactone (ALDACTONE) 25 MG tablet TAKE 1 TABLET (25 MG TOTAL) BY MOUTH DAILY. 02/25/16    Minus Breeding, MD    Allergies Amoxicillin-pot clavulanate  Family History  Problem Relation Age of Onset  . Heart disease Mother     massive MI age 57  . Heart attack Mother   . Heart disease Father     Massive MI age 61  . Prostate cancer Father   . Colon cancer Neg Hx     Social History Social History  Substance Use Topics  . Smoking status: Never Smoker  . Smokeless tobacco: Never Used  . Alcohol use No    Review of Systems Constitutional: No fever/chills Cardiovascular: Denies chest pain. Respiratory: Denies shortness of breath. Musculoskeletal: Positive for right shoulder pain. Skin: Negative for rash. Neurological: Negative for headaches, focal weakness or numbness.  10-point  ROS otherwise negative.  ____________________________________________   PHYSICAL EXAM:  VITAL SIGNS: ED Triage Vitals  Enc Vitals Group     BP 04/27/16 1122 (!) 108/43     Pulse Rate 04/27/16 1122 (!) 44     Resp 04/27/16 1122 18     Temp 04/27/16 1122 98.1 F (36.7 C)     Temp Source 04/27/16 1122 Oral     SpO2 04/27/16 1122 100 %     Weight 04/27/16 1122 154 lb (69.9 kg)     Height 04/27/16 1122 5' 5.25" (1.657 m)     Head Circumference --      Peak Flow --      Pain Score 04/27/16 1123 10     Pain Loc --      Pain Edu? --      Excl. in [place]? --     Constitutional: Alert and oriented. Well appearing and in no acute distress.  Cardiovascular: Normal rate, regular rhythm. Grossly normal heart sounds.  Good peripheral circulation. Respiratory: Normal respiratory effort.  No retractions. Lungs CTAB. Musculoskeletal: Right shoulder with limited range of motion. Distally neurovascularly intact. Ecchymosis and bruising noted around the shoulder area. Neurologic:  Normal speech and language. No gross focal neurologic deficits are appreciated. No gait instability. Skin:  Skin is warm, dry and intact. No rash noted. Psychiatric: Mood and affect are normal. Speech and behavior are  normal.  ____________________________________________   LABS (all labs ordered are listed, but only abnormal results are displayed)  Labs Reviewed - No data to display ____________________________________________  EKG  No stemi. Bradycardic with ventricular rate at 41 bpm. ____________________________________________  RADIOLOGY  IMPRESSION: Comminuted intra-articular fracture of the right humeral head. Comminuted and displaced multipart fracture of the humeral head and neck as described above. There is a vertical split fracture involving the humeral head as discussed above. See 3D images.  ___________________________________________   PROCEDURES  Procedure(s) performed: None  Critical Care performed: No  ____________________________________________   INITIAL IMPRESSION / ASSESSMENT AND PLAN / ED COURSE  Pertinent labs & imaging results that were available during my care of the patient were reviewed by me and considered in my medical decision making (see chart for details).  Discussed all clinical findings with patient. Discussed with Dr. Roland Rack. Place patient in a shoulder immobilizer and he will follow-up this week for preoperative care. She voices no other emergency medical complaints at this time. Rx is given for Percocet 5/325.  Clinical Course    ____________________________________________   FINAL CLINICAL IMPRESSION(S) / ED DIAGNOSES  Final diagnoses:  Humeral fracture, right, closed, initial encounter     This chart was dictated using voice recognition software/Dragon. Despite best efforts to proofread, errors can occur which can change the meaning. Any change was purely unintentional.    Arlyss Repress, PA-C 04/27/16 1605    Delman Kitten, MD 04/27/16 1630

## 2016-04-27 NOTE — Telephone Encounter (Signed)
The pt has been notified of the results of the CT scan and has been set up for a pre visit and colon.  She will call with any questions or concerns.

## 2016-04-27 NOTE — ED Provider Notes (Signed)
ECG reviewed and interpreted by me at 1210 Ventricular rate 40 PR 200 QRS 70 QTc 400 Reviewed and interpreted as sinus bradycardia, no evidence of acute ischemia.  Compared with the previous EKG from March 2017, rate reduce now   Delman Kitten, MD 04/27/16 1212

## 2016-04-27 NOTE — Telephone Encounter (Signed)
Please call the patient. The CT scan shows no explanation for her weight loss. Given her previous history of colon cancer I recommend we proceed with repeat colonoscopy at her soonest convenience.

## 2016-04-27 NOTE — ED Triage Notes (Signed)
Pt presents to ED with reports of tripping this morning while picking up toys. Pt reports landed on her right shoulder and now has pain. Pt denies dizziness, lightheadedness prior to fall. Pt denies hitting her head and denies LOC. Pt denies use of anticoagulants. Pt reports she normally has a low pulse rate.

## 2016-04-27 NOTE — Discharge Instructions (Signed)
Call the above orthopedic doctor, Dr. Roland Rack. Ice as needed. Avoid use of right arm as much as possible.

## 2016-04-28 ENCOUNTER — Ambulatory Visit (INDEPENDENT_AMBULATORY_CARE_PROVIDER_SITE_OTHER): Payer: Medicare Other | Admitting: Family Medicine

## 2016-04-28 ENCOUNTER — Encounter: Payer: Self-pay | Admitting: Family Medicine

## 2016-04-28 DIAGNOSIS — I10 Essential (primary) hypertension: Secondary | ICD-10-CM

## 2016-04-28 DIAGNOSIS — E119 Type 2 diabetes mellitus without complications: Secondary | ICD-10-CM | POA: Diagnosis not present

## 2016-04-28 DIAGNOSIS — E785 Hyperlipidemia, unspecified: Secondary | ICD-10-CM | POA: Diagnosis not present

## 2016-04-28 DIAGNOSIS — I480 Paroxysmal atrial fibrillation: Secondary | ICD-10-CM | POA: Diagnosis not present

## 2016-04-28 MED ORDER — CARVEDILOL 6.25 MG PO TABS: 6.2500 mg | ORAL_TABLET | Freq: Two times a day (BID) | ORAL | 5 refills | Status: DC

## 2016-04-28 NOTE — Assessment & Plan Note (Signed)
Well controlled. Continue current medication.  

## 2016-04-28 NOTE — Assessment & Plan Note (Signed)
Overtreated. Decreased metaglip to 1 tab BID. Low carb diet. Follow CBGs at home and call in 2 weeks.

## 2016-04-28 NOTE — Progress Notes (Signed)
75 year old female presents for 6 month DM follow up.   She fell yesterday after tripping.. Dx with right humeral fracture. Placed in shoulder immobilizer. Acute rx for percocet given.  BP and pulse very low in ER.  Has follow up with Ortho  This week with Dr. Durward Fortes.  Hypertension: Good control on coreg, diltiazem, losartan/HCTZ and spironolactone. BP Readings from Last 3 Encounters:  04/28/16 110/70  04/27/16 (!) 98/42  04/21/16 (!) 148/60  Using medication without problems or lightheadedness: none Chest pain with exertion: None  Edema Intermittent when on feet for long time.  Short of breath: Mild  Average home BPs: good control  Other issues: CAD, afib: stable per cards, rate controlled.  Diabetes: On metaglip, overtreated with some lows. Lab Results  Component Value Date   HGBA1C 5.4 04/21/2016  Hypoglycemic episodes: occ 70s Hyperglycemic episodes:None  Feet problems:none Blood Sugars averaging: Not checking Eye exam within last year: 12/2014 bilateral cataract surgery 5 and 02/2015  Elevated Cholesterol: Well controlled on atorvastatin 20 mg  LDL at goal < 70! Lab Results  Component Value Date   CHOL 88 04/21/2016   HDL 36.40 (L) 04/21/2016   LDLCALC 22 04/21/2016   LDLDIRECT 50.0 10/22/2014   TRIG 149.0 04/21/2016   CHOLHDL 2 04/21/2016  Muscle aches: None  Other complaints:  Moderate diet.  Minimal walking   Body mass index is 26.04 kg/m.   CKD, stable, keep with fluids.  Review of Systems  Constitutional: Negative for fever and fatigue.  HENT: Negative for ear pain.  Eyes: Negative for pain.  Respiratory: Negative for chest tightness and shortness of breath.  Cardiovascular: Negative for chest pain, palpitations and leg swelling.  Gastrointestinal: Negative for abdominal pain.  Genitourinary: Negative for dysuria. Urinary frequency and urgency, incontinence with cough sneeze more frequent now, 1-2 UOP at night, every 2-3  hours during the day ( not bothering her enough to treat).  Objective:   Physical Exam  Constitutional: Vital signs are normal except bradycardia. She appears well-developed and well-nourished. She is cooperative. Non-toxic appearance. She does not appear ill. No distress.  HENT:  Head: Normocephalic.  Right Ear: Hearing, tympanic membrane, external ear and ear canal normal.  Left Ear: Hearing, tympanic membrane, external ear and ear canal normal.  Nose: Nose normal.  Eyes: Conjunctivae, EOM and lids are normal. Pupils are equal, round, and reactive to light. No foreign bodies found.  Neck: Trachea normal and normal range of motion. Neck supple. Carotid bruit is not present. No mass and no thyromegaly present.  Cardiovascular: Normal rate, regular rhythm, S1 normal, S2 normal, normal heart sounds and intact distal pulses. Exam reveals no gallop.  No murmur heard.  Pulmonary/Chest: Effort normal and breath sounds normal. No respiratory distress. She has no wheezes. She has no rhonchi. She has no rales.  Abdominal: Soft. Normal appearance and bowel sounds are normal. She exhibits no distension, no fluid wave, no abdominal bruit and no mass. There is no hepatosplenomegaly. There is no tenderness. There is no rebound, no guarding and no CVA tenderness. No hernia.   Lymphadenopathy:  She has no cervical adenopathy.  She has no axillary adenopathy.  Neurological: She is alert. She has normal strength. No cranial nerve deficit or sensory deficit.  Skin: Skin is warm, dry and intact. No rash noted.  Psychiatric: Her speech is normal and behavior is normal. Judgment normal. Her mood appears not anxious. Cognition and memory are normal. She does not exhibit a depressed mood.  Diabetic foot exam:  blisters between toes, no sign of infection, keep shoe change. Normal inspection  No skin breakdown  No calluses  Normal DP pulses  Normal sensation to light tough and monofilament   Nails normal  Assessment & Plan:

## 2016-04-28 NOTE — Patient Instructions (Addendum)
Follow blood sugar fasting in morning.. If still remaining low, decrease diabetes medicine 1 tab twice daily.  Follow CBGs closely.. Call if CBG running consistently > 120-130  Increase activity as tolerated.  Decrease coreg to 6.25 mg twice daily.  Follow BP at home. Call in 1-2 week with BP, blood sugars and HRs.

## 2016-04-28 NOTE — Assessment & Plan Note (Signed)
Rate controlled... BP and pulse running too low.. Pt symptomatic.

## 2016-04-28 NOTE — Progress Notes (Signed)
Pre visit review using our clinic review tool, if applicable. No additional management support is needed unless otherwise documented below in the visit note. 

## 2016-04-28 NOTE — Assessment & Plan Note (Signed)
BPs running low, pt dizzy at times. Bradycardia.  Decrease coreg to 6.25 mg twice daily. Follow BP and pulse.. Call in 2 weeks with measurements.  Keep appt with Dr. Percival Spanish as scheduled in 05/2016 CC: Hochrein.

## 2016-04-30 DIAGNOSIS — S42291A Other displaced fracture of upper end of right humerus, initial encounter for closed fracture: Secondary | ICD-10-CM | POA: Diagnosis not present

## 2016-05-08 ENCOUNTER — Other Ambulatory Visit: Payer: Self-pay | Admitting: Cardiology

## 2016-05-13 DIAGNOSIS — S42291D Other displaced fracture of upper end of right humerus, subsequent encounter for fracture with routine healing: Secondary | ICD-10-CM | POA: Diagnosis not present

## 2016-05-14 ENCOUNTER — Other Ambulatory Visit: Payer: Self-pay | Admitting: Orthopedic Surgery

## 2016-05-14 ENCOUNTER — Telehealth: Payer: Self-pay | Admitting: Cardiology

## 2016-05-14 DIAGNOSIS — S42291D Other displaced fracture of upper end of right humerus, subsequent encounter for fracture with routine healing: Secondary | ICD-10-CM | POA: Diagnosis not present

## 2016-05-14 DIAGNOSIS — S4291XA Fracture of right shoulder girdle, part unspecified, initial encounter for closed fracture: Secondary | ICD-10-CM

## 2016-05-15 ENCOUNTER — Encounter: Payer: Self-pay | Admitting: Cardiology

## 2016-05-15 ENCOUNTER — Other Ambulatory Visit: Payer: Medicare Other

## 2016-05-15 ENCOUNTER — Telehealth: Payer: Self-pay | Admitting: Cardiology

## 2016-05-15 NOTE — Telephone Encounter (Signed)
[facility] requests surgical clearance:  1. Type of surgery: right reverse shoulder replacement  2. Date of surgery: not specified on clearance request sheet - chart review of epic shows surgery scheduled for 05/28/16 3. Surgeon: Dr. Marlou Sa 4. Medications that need to be held & how long: none specified  5. Fax and/or Phone: (f) [phone]  (p) [phone]  Patient has appointment for clearance eval w/MD on 05/18/16

## 2016-05-17 ENCOUNTER — Encounter: Payer: Self-pay | Admitting: Cardiology

## 2016-05-17 NOTE — Telephone Encounter (Signed)
She has a preop appt with me on 8/21

## 2016-05-17 NOTE — Progress Notes (Signed)
HPI The patient has  coronary disease as described below and she follows for preop evaluation prior to getting shoulder surgery.   She had a negative stress perfusion study in 2013.   Since I last saw her she has done well.  The patient denies any new symptoms such as chest discomfort, neck or arm discomfort. There has been no new shortness of breath, PND or orthopnea. There have been no reported palpitations, presyncope or syncope.  She injured her shoulder kicking a soccer ball in the house. She remains active.  She does all of her chores and takes care of an 59 month old grandson and 47 year old granddaughter.     Allergies  Allergen Reactions  . Amoxicillin-Pot Clavulanate Swelling    Augmentin per patient, tongue swelling    Current Outpatient Prescriptions  Medication Sig Dispense Refill  . aspirin 81 MG tablet Take 81 mg by mouth daily.    Marland Kitchen atorvastatin (LIPITOR) 20 MG tablet TAKE 1 TABLET BY MOUTH DAILY. 90 tablet 2  . calcium-vitamin D (OSCAL WITH D 500-200) 500-200 MG-UNIT per tablet Take 1 tablet by mouth 2 (two) times daily.      . carvedilol (COREG) 6.25 MG tablet Take 1 tablet (6.25 mg total) by mouth 2 (two) times daily with a meal. 60 tablet 5  . cetirizine (ZYRTEC) 10 MG tablet Take 10 mg by mouth daily as needed for allergies.    . citalopram (CELEXA) 20 MG tablet TAKE 1 TABLET (20 MG TOTAL) BY MOUTH DAILY. 90 tablet 1  . diltiazem (CARDIZEM CD) 240 MG 24 hr capsule TAKE ONE CAPSULE BY MOUTH EVERY DAY 90 capsule 3  . ferrous sulfate 325 (65 FE) MG EC tablet Take 325 mg by mouth 2 (two) times daily.    Marland Kitchen glipizide-metformin (METAGLIP) 2.5-250 MG tablet TAKE 2 TABLETS BY MOUTH TWICE A DAY BEFORE A MEAL 360 tablet 1  . losartan-hydrochlorothiazide (HYZAAR) 100-25 MG tablet TAKE 1 TABLET BY MOUTH DAILY. 90 tablet 3  . nystatin cream (MYCOSTATIN) Apply 1 application topically 2 (two) times daily as needed for dry skin. 30 g 1  . omeprazole (PRILOSEC) 20 MG capsule TAKE 2  CAPSULES BY MOUTH EVERY DAY (Patient taking differently: TAKE 1 CAPSULES BY MOUTH EVERY DAY) 180 capsule 3  . PREVALITE 4 g packet TAKE 1 PACKET TWICE A DAY WITH A MEAL, TAKE AT LEAST 2 HOURS AWAY FROM OTHER MEDICATIONS. 60 packet 1  . spironolactone (ALDACTONE) 25 MG tablet TAKE 1 TABLET (25 MG TOTAL) BY MOUTH DAILY. 90 tablet 1   No current facility-administered medications for this visit.     Past Medical History:  Diagnosis Date  . Adenocarcinoma, colon ([place])   . Anemia    iron deficiency  . Anxiety   . Carpal tunnel syndrome   . Chronic diarrhea   . Colon polyps    Tubular adenoma  . Degenerative disk disease   . Depression   . Diabetes mellitus, type 2 ([place])   . Dysrhythmia    h/o AFIB  . Fall at home 03/16/2014   no fx; multiple facial lacerations, abrasions and hematoma  . GERD (gastroesophageal reflux disease)   . Headache   . Hyperlipidemia   . Hypertension   . IBS (irritable bowel syndrome)   . Osteoarthritis   . S/P coronary artery stent placement 2007  . Sciatica     Past Surgical History:  Procedure Laterality Date  . CARDIAC CATHETERIZATION  12/10   LAD stent  patent. Insignificant CAD, otherwise EF 60-65%  . CARPAL TUNNEL RELEASE    . CATARACT EXTRACTION  03/2015  . CHOLECYSTECTOMY    . DILATION AND CURETTAGE OF UTERUS    . HEMATOMA EVACUATION     after knee replacement  . KNEE ARTHROSCOPY     bilateral  . LUMBAR LAMINECTOMY N/A 11/26/2015   Procedure: LUMBAR DECOMPRESSIVE LAMINECTOMY L3-4 AND L4-5, LEFT L2-3 HEMILAMINECTOMY;  Surgeon: Jessy Oto, MD;  Location: [place];  Service: Orthopedics;  Laterality: N/A;  . PARTIAL COLECTOMY  11/2008   right, for adenocarcinoma  . REPLACEMENT TOTAL KNEE  99991111   right, complicated by hemarthrosis   . TONSILLECTOMY    . TOTAL KNEE ARTHROPLASTY Left 04/03/2014   dr whitfield  . TOTAL KNEE ARTHROPLASTY Left 04/03/2014   Procedure: TOTAL KNEE ARTHROPLASTY;  Surgeon: Garald Balding, MD;  Location: [place];   Service: Orthopedics;  Laterality: Left;  . TOTAL KNEE REVISION  03/08/2012   Procedure: TOTAL KNEE REVISION;  Surgeon: Garald Balding, MD;  Location: [place];  Service: Orthopedics;  Laterality: Right;  removal total knee hardware and placement of antibiotic cement spacer and antibiotic beads  . TOTAL KNEE REVISION  05/17/2012   Procedure: TOTAL KNEE REVISION;  Surgeon: Garald Balding, MD;  Location: [place];  Service: Orthopedics;  Laterality: Right;  right total knee revision, removal of antibiotic spacer    ROS:  Decreased smell.  Otherwise as stated in the HPI and negative for all other systems.  PHYSICAL EXAM BP 112/60 (BP Location: Left Arm, Patient Position: Sitting, Cuff Size: Normal)   Pulse (!) 54   Ht 5' 5.5" (1.664 m)   Wt 154 lb 12.8 oz (70.2 kg)   BMI 25.37 kg/m  GENERAL:  Well appearing NECK:  No jugular venous distention, waveform within normal limits, carotid upstroke brisk and symmetric, no bruits, no thyromegaly LUNGS:  Clear to auscultation bilaterally BACK:  No CVA tenderness CHEST:  Unremarkable HEART:  PMI not displaced or sustained,S1 and S2 within normal limits, no S3, no S4, no clicks, no rubs, no murmurs ABD:  Flat, positive bowel sounds normal in frequency in pitch, no bruits, no rebound, no guarding, no midline pulsatile mass, no hepatomegaly, no splenomegaly EXT:  2 plus pulses throughout, mild left leg edema unchanged, no cyanosis no clubbing  EKG:  Sinus rhythm, rate 54, axis within normal limits, intervals within normal limits, no acute ST-T wave changes. 05/18/2016  Lab Results  Component Value Date   CHOL 88 04/21/2016   TRIG 149.0 04/21/2016   HDL 36.40 (L) 04/21/2016   LDLCALC 22 04/21/2016   LDLDIRECT 50.0 10/22/2014   Lab Results  Component Value Date   HGBA1C 5.4 04/21/2016    ASSESSMENT AND PLAN  PREOP:  She is active and is not going for a high risk procedure from a cardiovascular standpoint.  She has no active symptoms.  There are  no high risk findings.  Therefore, based on ACC/AHA guidelines, the patient would be at acceptable risk for the planned procedure without further cardiovascular testing.  CAD:  The patient has no new sypmtoms.  No further cardiovascular testing is indicated.  We will continue with aggressive risk reduction and meds as listed.  HTN:   The blood pressure is at target. No change in medications is indicated. We will continue with therapeutic lifestyle changes (TLC).  DYSLIPIDEMIA:  Her lipids are above.  She will remain on the meds as listed.   DM:  A1C  was her blood sugar is well controlled.  She can continue the meds as listed.

## 2016-05-18 ENCOUNTER — Other Ambulatory Visit: Payer: Self-pay | Admitting: Gastroenterology

## 2016-05-18 ENCOUNTER — Ambulatory Visit (INDEPENDENT_AMBULATORY_CARE_PROVIDER_SITE_OTHER): Payer: Medicare Other | Admitting: Cardiology

## 2016-05-18 ENCOUNTER — Encounter: Payer: Self-pay | Admitting: Cardiology

## 2016-05-18 VITALS — BP 112/60 | HR 54 | Ht 65.5 in | Wt 154.8 lb

## 2016-05-18 DIAGNOSIS — Z01818 Encounter for other preprocedural examination: Secondary | ICD-10-CM

## 2016-05-18 DIAGNOSIS — I2510 Atherosclerotic heart disease of native coronary artery without angina pectoris: Secondary | ICD-10-CM

## 2016-05-18 NOTE — Patient Instructions (Signed)
Medication Instructions:  Continue current medication  Labwork: NONE  Testing/Procedures: NONE  Follow-Up: Your physician wants you to follow-up in: 1 Year. You will receive a reminder letter in the mail two months in advance. If you don't receive a letter, please call our office to schedule the follow-up appointment.   Any Other Special Instructions Will Be Listed Below (If Applicable).  You are cleared for surgery, a letter will be faxed to your Surgeon.   If you need a refill on your cardiac medications before your next appointment, please call your pharmacy.

## 2016-05-19 ENCOUNTER — Ambulatory Visit: Payer: Medicare Other | Admitting: Cardiology

## 2016-05-19 ENCOUNTER — Ambulatory Visit
Admission: RE | Admit: 2016-05-19 | Discharge: 2016-05-19 | Disposition: A | Discharge status: Home / Self Care | Payer: Medicare Other | Source: Ambulatory Visit | Attending: Orthopedic Surgery | Admitting: Orthopedic Surgery

## 2016-05-19 DIAGNOSIS — S42291A Other displaced fracture of upper end of right humerus, initial encounter for closed fracture: Secondary | ICD-10-CM | POA: Diagnosis not present

## 2016-05-19 DIAGNOSIS — S4291XA Fracture of right shoulder girdle, part unspecified, initial encounter for closed fracture: Secondary | ICD-10-CM

## 2016-05-20 NOTE — Telephone Encounter (Signed)
Dr hochrein office note faxed to [employer] via faxed machine and [employer]

## 2016-05-21 ENCOUNTER — Other Ambulatory Visit: Payer: Medicare Other

## 2016-05-21 NOTE — Pre-Procedure Instructions (Signed)
GABINA TAKHAR  05/21/2016      [id number] [address], [place] [address] Phone: [phone] Fax: [phone]    Your procedure is scheduled on Thursday, May 28, 2016   Report to [facility] Admitting at 10:15 A.M.   Call this number if you have problems the morning of surgery:  [phone]   Remember:  Do not eat food or drink liquids after midnight Wednesday, May 27, 2016  Take these medicines the morning of surgery with A SIP OF WATER : Carvedilol ( Coreg), Citalopram   (Celexa), Diltiazem ( Cardizem), Omeprazole ( Prilosec), if needed: Cetirizine ( Zyrtec)  Stop taking Aspirin, vitamins, fish oil and herbal medications. Do not take any NSAIDs ie: Ibuprofen, Advil, Naproxen, BC and Goody Powder or any medication containing Aspirin; stop now.    How to Manage Your Diabetes Before and After Surgery  Why is it important to control my blood sugar before and after surgery? . Improving blood sugar levels before and after surgery helps healing and can limit problems. . A way of improving blood sugar control is eating a healthy diet by: o  Eating less sugar and carbohydrates o  Increasing activity/exercise o  Talking with your doctor about reaching your blood sugar goals . High blood sugars (greater than 180 mg/dL) can raise your risk of infections and slow your recovery, so you will need to focus on controlling your diabetes during the weeks before surgery. . Make sure that the doctor who takes care of your diabetes knows about your planned surgery including the date and location.  How do I manage my blood sugar before surgery? . Check your blood sugar at least 4 times a day, starting 2 days before surgery, to make sure that the level is not too high or low. o Check your blood sugar the morning of your surgery when you wake up and every 2 hours until you get to the Short Stay unit. . If your blood sugar  is less than 70 mg/dL, you will need to treat for low blood sugar: o Do not take insulin. o Treat a low blood sugar (less than 70 mg/dL) with  cup of clear juice (cranberry or apple), 4 glucose tablets, OR glucose gel. o Recheck blood sugar in 15 minutes after treatment (to make sure it is greater than 70 mg/dL). If your blood sugar is not greater than 70 mg/dL on recheck, call [phone] for further instructions. . Report your blood sugar to the short stay nurse when you get to Short Stay.  . If you are admitted to the hospital after surgery: o Your blood sugar will be checked by the staff and you will probably be given insulin after surgery (instead of oral diabetes medicines) to make sure you have good blood sugar levels. o The goal for blood sugar control after surgery is 80-180 mg/dL.  WHAT DO I DO ABOUT MY DIABETES MEDICATION?   Marland Kitchen Do not take oral diabetes medicines (pills) the morning of surgery such as Glipizide- Metformin ( Metaglip)      Do not wear jewelry, make-up or nail polish.  Do not wear lotions, powders, or perfumes, or deoderant.  Do not shave 48 hours prior to surgery.  Men may shave face and neck.  Do not bring valuables to the hospital.  [facility] is not responsible for any belongings or valuables.  Contacts, dentures or bridgework may not be worn into  surgery.  Leave your suitcase in the car.  After surgery it may be brought to your room.  For patients admitted to the hospital, discharge time will be determined by your treatment team.  Patients discharged the day of surgery will not be allowed to drive home.   Name and phone number of your driver:  Special instructions: Shower the night before surgery and the morning of surgery with CHG.  Please read over the following fact sheets that you were given. Pain Booklet, Coughing and Deep Breathing, Blood Transfusion Information, MRSA Information and Surgical Site Infection Prevention

## 2016-05-22 ENCOUNTER — Ambulatory Visit (HOSPITAL_COMMUNITY)
Admission: RE | Admit: 2016-05-22 | Discharge: 2016-05-22 | Disposition: A | Discharge status: Home / Self Care | Payer: Medicare Other | Source: Ambulatory Visit | Attending: Orthopedic Surgery | Admitting: Orthopedic Surgery

## 2016-05-22 ENCOUNTER — Encounter (HOSPITAL_COMMUNITY): Payer: Self-pay

## 2016-05-22 ENCOUNTER — Encounter (HOSPITAL_COMMUNITY)
Admission: RE | Admit: 2016-05-22 | Discharge: 2016-05-22 | Disposition: A | Discharge status: Home / Self Care | Payer: Medicare Other | Source: Ambulatory Visit | Attending: Orthopedic Surgery | Admitting: Orthopedic Surgery

## 2016-05-22 DIAGNOSIS — Z96653 Presence of artificial knee joint, bilateral: Secondary | ICD-10-CM | POA: Diagnosis not present

## 2016-05-22 DIAGNOSIS — Z85038 Personal history of other malignant neoplasm of large intestine: Secondary | ICD-10-CM | POA: Insufficient documentation

## 2016-05-22 DIAGNOSIS — S42201A Unspecified fracture of upper end of right humerus, initial encounter for closed fracture: Secondary | ICD-10-CM | POA: Insufficient documentation

## 2016-05-22 DIAGNOSIS — S42291A Other displaced fracture of upper end of right humerus, initial encounter for closed fracture: Secondary | ICD-10-CM | POA: Diagnosis not present

## 2016-05-22 DIAGNOSIS — Z955 Presence of coronary angioplasty implant and graft: Secondary | ICD-10-CM | POA: Insufficient documentation

## 2016-05-22 DIAGNOSIS — Z01812 Encounter for preprocedural laboratory examination: Secondary | ICD-10-CM | POA: Insufficient documentation

## 2016-05-22 DIAGNOSIS — E785 Hyperlipidemia, unspecified: Secondary | ICD-10-CM | POA: Diagnosis not present

## 2016-05-22 DIAGNOSIS — X58XXXA Exposure to other specified factors, initial encounter: Secondary | ICD-10-CM | POA: Diagnosis not present

## 2016-05-22 DIAGNOSIS — Z7984 Long term (current) use of oral hypoglycemic drugs: Secondary | ICD-10-CM | POA: Diagnosis not present

## 2016-05-22 DIAGNOSIS — I10 Essential (primary) hypertension: Secondary | ICD-10-CM | POA: Insufficient documentation

## 2016-05-22 DIAGNOSIS — F329 Major depressive disorder, single episode, unspecified: Secondary | ICD-10-CM | POA: Insufficient documentation

## 2016-05-22 DIAGNOSIS — Z0183 Encounter for blood typing: Secondary | ICD-10-CM | POA: Insufficient documentation

## 2016-05-22 DIAGNOSIS — K219 Gastro-esophageal reflux disease without esophagitis: Secondary | ICD-10-CM | POA: Diagnosis not present

## 2016-05-22 DIAGNOSIS — Z01818 Encounter for other preprocedural examination: Secondary | ICD-10-CM

## 2016-05-22 DIAGNOSIS — Z7982 Long term (current) use of aspirin: Secondary | ICD-10-CM | POA: Insufficient documentation

## 2016-05-22 DIAGNOSIS — Z79899 Other long term (current) drug therapy: Secondary | ICD-10-CM | POA: Insufficient documentation

## 2016-05-22 DIAGNOSIS — I4891 Unspecified atrial fibrillation: Secondary | ICD-10-CM | POA: Diagnosis not present

## 2016-05-22 DIAGNOSIS — I2510 Atherosclerotic heart disease of native coronary artery without angina pectoris: Secondary | ICD-10-CM | POA: Diagnosis not present

## 2016-05-22 DIAGNOSIS — E119 Type 2 diabetes mellitus without complications: Secondary | ICD-10-CM | POA: Diagnosis not present

## 2016-05-22 LAB — TYPE AND SCREEN
ABO/RH(D): A POS
ANTIBODY SCREEN: NEGATIVE

## 2016-05-22 LAB — BASIC METABOLIC PANEL
Anion gap: 7 (ref 5–15)
BUN: 24 mg/dL — AB (ref 6–20)
CO2: 21 mmol/L — ABNORMAL LOW (ref 22–32)
CREATININE: 1.48 mg/dL — AB (ref 0.44–1.00)
Calcium: 9.4 mg/dL (ref 8.9–10.3)
Chloride: 111 mmol/L (ref 101–111)
GFR calc Af Amer: 38 mL/min — ABNORMAL LOW (ref >60)
GFR, EST NON AFRICAN AMERICAN: 33 mL/min — AB (ref >60)
Glucose, Bld: 130 mg/dL — ABNORMAL HIGH (ref 65–99)
POTASSIUM: 4.3 mmol/L (ref 3.5–5.1)
SODIUM: 139 mmol/L (ref 135–145)

## 2016-05-22 LAB — CBC
HEMATOCRIT: 35 % — AB (ref 36.0–46.0)
Hemoglobin: 11 g/dL — ABNORMAL LOW (ref 12.0–15.0)
MCH: 30.7 pg (ref 26.0–34.0)
MCHC: 31.4 g/dL (ref 30.0–36.0)
MCV: 97.8 fL (ref 78.0–100.0)
PLATELETS: 237 10*3/uL (ref 150–400)
RBC: 3.58 MIL/uL — ABNORMAL LOW (ref 3.87–5.11)
RDW: 13.1 % (ref 11.5–15.5)
WBC: 8.7 10*3/uL (ref 4.0–10.5)

## 2016-05-22 LAB — URINALYSIS, ROUTINE W REFLEX MICROSCOPIC
BILIRUBIN URINE: NEGATIVE
GLUCOSE, UA: NEGATIVE mg/dL
HGB URINE DIPSTICK: NEGATIVE
KETONES UR: NEGATIVE mg/dL
Nitrite: NEGATIVE
PH: 5.5 (ref 5.0–8.0)
Protein, ur: NEGATIVE mg/dL
Specific Gravity, Urine: 1.016 (ref 1.005–1.030)

## 2016-05-22 LAB — GLUCOSE, CAPILLARY: Glucose-Capillary: 111 mg/dL — ABNORMAL HIGH (ref 65–99)

## 2016-05-22 LAB — URINE MICROSCOPIC-ADD ON: RBC / HPF: NONE SEEN RBC/hpf (ref 0–5)

## 2016-05-22 LAB — SURGICAL PCR SCREEN
MRSA, PCR: POSITIVE — AB
STAPHYLOCOCCUS AUREUS: POSITIVE — AB

## 2016-05-22 NOTE — Progress Notes (Addendum)
PCP: Dr. Algie Coffer @ [address] Cardiologist: Dr. Percival Spanish  Fasting blood sugars 90-100, PCP manages diabetes  Spoke with diabetes coordinator, Dillard Cannon, she suggested for pt. Not to take the diabetic med. At dinner time the day before surgery. Instructed pt.   [facility] [address]

## 2016-05-23 LAB — URINE CULTURE

## 2016-05-25 NOTE — Progress Notes (Signed)
Anesthesia Chart Review: Patient is a 75 year old female scheduled for right reverse shoulder replacement on 05/28/16 by Dr. Marlou Sa.  History includes non-smoker, CAD s/p LAD BMS '07, afib, HLD, OA, anemia, depression, DM2, peripheral edema, HTN, colon CA s/p partial colectomy '10, GERD, anxiety, IBS, cholecystectomy, right TKA complicated by hemarthrosis 04/2010 with removal of prothesis and placement of antibiotic spacer 03/08/12 with right TKR revision 05/17/12, left TKA 04/03/14, L2-5 hemi-laminectomy 11/26/15. Review of labs since 11/2014 are consistent with CKD stage III.    PCP is Dr. [name], last visit 04/28/16.  Cardiologist is Dr. Percival Spanish, last seen 05/18/16. He wrote:  "PREOP:  She is active and is not going for a high risk procedure from a cardiovascular standpoint.  She has no active symptoms.  There are no high risk findings.  Therefore, based on ACC/AHA guidelines, the patient would be at acceptable risk for the planned procedure without further cardiovascular testing."  Meds include aspirin 81 mg, Lipitor, Coreg, Celexa, Cardizem CD, metformin, 65 FE, losartan-HCTZ, Prilosec, Aldactone.  05/18/16 EKG: SB at 54 bpm. Low voltage QRS.   02/09/12 Nuclear stress test: Normal stress nuclear study. There is breast attenuation that likely accounts for the mild fixed anteroseptal defect with normal motion. LV Ejection Fraction: 71%. LV Wall Motion: NL LV Function; NL Wall Motion.   11/01/08 Echo: Overall left ventricular systolic function was normal. Left ventricular ejection fraction was estimated , range being 60 % to 65 %. There were no left ventricular regional wall motion abnormalities. Trivial MR/TR/PR.   09/17/09 Cardiac cath: Patent LAD stent, otherwise non-obstructive CAD (40% left main stenosis, 40% diagonal stenosis, 40% circumflex stenosis), normal LV function, normal right-sided pressures.   05/22/16 CXR: IMPRESSION: Comminuted right proximal humeral fracture with  inferior Subluxation. No active cardiopulmonary disease.  Preoperative labs noted.  Cr 1.48, BUN 24, stable when compared to labs dating back to last year. H/H 11.0/35.0. T&S done. A1c 7.25.17 was 5.4. UA showed multiple species present, suggest recollection.   She has been cleared by cardiology. Her renal function appears stable. Further evaluation by her assigned anesthesiologist on the day of surgery, but if no acute changes then I would anticipate that she could proceed as planned.  George Hugh [facility] Short Stay Center/Anesthesiology Phone [phone] 05/25/2016 9:48 AM

## 2016-05-25 NOTE — Telephone Encounter (Signed)
Closed encounter °

## 2016-05-27 ENCOUNTER — Encounter: Payer: Self-pay | Admitting: Cardiology

## 2016-05-27 ENCOUNTER — Other Ambulatory Visit: Payer: Self-pay | Admitting: Orthopedic Surgery

## 2016-05-28 ENCOUNTER — Encounter (HOSPITAL_COMMUNITY): Payer: Self-pay | Admitting: *Deleted

## 2016-05-28 ENCOUNTER — Ambulatory Visit (HOSPITAL_COMMUNITY): Payer: Medicare Other | Admitting: Vascular Surgery

## 2016-05-28 ENCOUNTER — Inpatient Hospital Stay (HOSPITAL_COMMUNITY)
Admission: RE | Admit: 2016-05-28 | Discharge: 2016-05-30 | DRG: 483 | Disposition: A | Discharge status: Home Health | Payer: Medicare Other | Source: Ambulatory Visit | Attending: Orthopedic Surgery | Admitting: Orthopedic Surgery

## 2016-05-28 ENCOUNTER — Ambulatory Visit (HOSPITAL_COMMUNITY): Payer: Medicare Other | Admitting: Anesthesiology

## 2016-05-28 ENCOUNTER — Encounter (HOSPITAL_COMMUNITY)
Admission: RE | Disposition: A | Discharge status: Home Health | Payer: Self-pay | Source: Ambulatory Visit | Attending: Orthopedic Surgery

## 2016-05-28 DIAGNOSIS — I2510 Atherosclerotic heart disease of native coronary artery without angina pectoris: Secondary | ICD-10-CM | POA: Diagnosis present

## 2016-05-28 DIAGNOSIS — Z85038 Personal history of other malignant neoplasm of large intestine: Secondary | ICD-10-CM | POA: Diagnosis not present

## 2016-05-28 DIAGNOSIS — Z955 Presence of coronary angioplasty implant and graft: Secondary | ICD-10-CM

## 2016-05-28 DIAGNOSIS — Z96653 Presence of artificial knee joint, bilateral: Secondary | ICD-10-CM | POA: Diagnosis present

## 2016-05-28 DIAGNOSIS — K219 Gastro-esophageal reflux disease without esophagitis: Secondary | ICD-10-CM | POA: Diagnosis present

## 2016-05-28 DIAGNOSIS — K589 Irritable bowel syndrome without diarrhea: Secondary | ICD-10-CM | POA: Diagnosis present

## 2016-05-28 DIAGNOSIS — W19XXXA Unspecified fall, initial encounter: Secondary | ICD-10-CM | POA: Diagnosis present

## 2016-05-28 DIAGNOSIS — E785 Hyperlipidemia, unspecified: Secondary | ICD-10-CM | POA: Diagnosis present

## 2016-05-28 DIAGNOSIS — S42211A Unspecified displaced fracture of surgical neck of right humerus, initial encounter for closed fracture: Principal | ICD-10-CM | POA: Diagnosis present

## 2016-05-28 DIAGNOSIS — M25511 Pain in right shoulder: Secondary | ICD-10-CM | POA: Diagnosis not present

## 2016-05-28 DIAGNOSIS — F419 Anxiety disorder, unspecified: Secondary | ICD-10-CM | POA: Diagnosis present

## 2016-05-28 DIAGNOSIS — Z7984 Long term (current) use of oral hypoglycemic drugs: Secondary | ICD-10-CM | POA: Diagnosis not present

## 2016-05-28 DIAGNOSIS — G8918 Other acute postprocedural pain: Secondary | ICD-10-CM | POA: Diagnosis not present

## 2016-05-28 DIAGNOSIS — S42201A Unspecified fracture of upper end of right humerus, initial encounter for closed fracture: Secondary | ICD-10-CM | POA: Diagnosis not present

## 2016-05-28 DIAGNOSIS — I10 Essential (primary) hypertension: Secondary | ICD-10-CM | POA: Diagnosis present

## 2016-05-28 DIAGNOSIS — E119 Type 2 diabetes mellitus without complications: Secondary | ICD-10-CM | POA: Diagnosis present

## 2016-05-28 DIAGNOSIS — F329 Major depressive disorder, single episode, unspecified: Secondary | ICD-10-CM | POA: Diagnosis present

## 2016-05-28 DIAGNOSIS — M1990 Unspecified osteoarthritis, unspecified site: Secondary | ICD-10-CM | POA: Diagnosis present

## 2016-05-28 DIAGNOSIS — S42209A Unspecified fracture of upper end of unspecified humerus, initial encounter for closed fracture: Secondary | ICD-10-CM | POA: Diagnosis present

## 2016-05-28 DIAGNOSIS — S42291D Other displaced fracture of upper end of right humerus, subsequent encounter for fracture with routine healing: Secondary | ICD-10-CM | POA: Diagnosis not present

## 2016-05-28 HISTORY — PX: REVERSE SHOULDER ARTHROPLASTY: SHX5054

## 2016-05-28 LAB — GLUCOSE, CAPILLARY
GLUCOSE-CAPILLARY: 133 mg/dL — AB (ref 65–99)
Glucose-Capillary: 114 mg/dL — ABNORMAL HIGH (ref 65–99)
Glucose-Capillary: 136 mg/dL — ABNORMAL HIGH (ref 65–99)

## 2016-05-28 SURGERY — ARTHROPLASTY, SHOULDER, TOTAL, REVERSE
Anesthesia: General | Site: Shoulder | Laterality: Right

## 2016-05-28 MED ORDER — FENTANYL CITRATE (PF) 100 MCG/2ML IJ SOLN
INTRAMUSCULAR | Status: AC
  Filled 2016-05-28: qty 2

## 2016-05-28 MED ORDER — METOCLOPRAMIDE HCL 5 MG/ML IJ SOLN
5.0000 mg | Freq: Three times a day (TID) | INTRAMUSCULAR | Status: DC | PRN
  Administered 2016-05-29 – 2016-05-30 (×2): 10 mg via INTRAVENOUS
  Filled 2016-05-28 (×2): qty 2

## 2016-05-28 MED ORDER — PROPOFOL 10 MG/ML IV BOLUS
INTRAVENOUS | Status: AC
  Filled 2016-05-28: qty 20

## 2016-05-28 MED ORDER — CARVEDILOL 6.25 MG PO TABS
6.2500 mg | ORAL_TABLET | Freq: Two times a day (BID) | ORAL | Status: DC
  Administered 2016-05-29 (×2): 6.25 mg via ORAL
  Filled 2016-05-28 (×2): qty 1

## 2016-05-28 MED ORDER — GLIPIZIDE-METFORMIN HCL 2.5-250 MG PO TABS: 1.0000 | ORAL_TABLET | Freq: Two times a day (BID) | ORAL | Status: DC

## 2016-05-28 MED ORDER — FENTANYL CITRATE (PF) 100 MCG/2ML IJ SOLN
INTRAMUSCULAR | Status: DC | PRN
Start: 2016-05-28 — End: 2016-05-28
  Administered 2016-05-28: 25 ug via INTRAVENOUS

## 2016-05-28 MED ORDER — GLIPIZIDE 5 MG PO TABS
2.5000 mg | ORAL_TABLET | Freq: Two times a day (BID) | ORAL | Status: DC
  Administered 2016-05-29 – 2016-05-30 (×3): 2.5 mg via ORAL
  Filled 2016-05-28 (×3): qty 1

## 2016-05-28 MED ORDER — CALCIUM CARBONATE-VITAMIN D 500-200 MG-UNIT PO TABS
1.0000 | ORAL_TABLET | Freq: Two times a day (BID) | ORAL | Status: DC
  Administered 2016-05-28 – 2016-05-30 (×4): 1 via ORAL
  Filled 2016-05-28 (×4): qty 1

## 2016-05-28 MED ORDER — ASPIRIN EC 325 MG PO TBEC
325.0000 mg | DELAYED_RELEASE_TABLET | Freq: Every day | ORAL | Status: DC
  Administered 2016-05-29 – 2016-05-30 (×2): 325 mg via ORAL
  Filled 2016-05-28 (×2): qty 1

## 2016-05-28 MED ORDER — METFORMIN HCL 500 MG PO TABS
250.0000 mg | ORAL_TABLET | Freq: Two times a day (BID) | ORAL | Status: DC
  Administered 2016-05-29 – 2016-05-30 (×3): 250 mg via ORAL
  Filled 2016-05-28 (×3): qty 1

## 2016-05-28 MED ORDER — BOOST / RESOURCE BREEZE PO LIQD
1.0000 | Freq: Three times a day (TID) | ORAL | Status: DC
  Administered 2016-05-28 – 2016-05-29 (×3): 1 via ORAL

## 2016-05-28 MED ORDER — FERROUS SULFATE 325 (65 FE) MG PO TABS
325.0000 mg | ORAL_TABLET | Freq: Two times a day (BID) | ORAL | Status: DC
  Administered 2016-05-28 – 2016-05-30 (×4): 325 mg via ORAL
  Filled 2016-05-28 (×4): qty 1

## 2016-05-28 MED ORDER — PHENYLEPHRINE HCL 10 MG/ML IJ SOLN
INTRAVENOUS | Status: DC | PRN
  Administered 2016-05-28: 30 ug/min via INTRAVENOUS

## 2016-05-28 MED ORDER — MORPHINE SULFATE (PF) 2 MG/ML IV SOLN
2.0000 mg | INTRAVENOUS | Status: DC | PRN
  Administered 2016-05-29: 2 mg via INTRAVENOUS
  Filled 2016-05-28: qty 1

## 2016-05-28 MED ORDER — METOCLOPRAMIDE HCL 5 MG PO TABS
5.0000 mg | ORAL_TABLET | Freq: Three times a day (TID) | ORAL | Status: DC | PRN
  Administered 2016-05-29: 10 mg via ORAL
  Filled 2016-05-28: qty 2

## 2016-05-28 MED ORDER — VANCOMYCIN HCL IN DEXTROSE 1-5 GM/200ML-% IV SOLN
1000.0000 mg | INTRAVENOUS | Status: AC
  Administered 2016-05-28: 1000 mg via INTRAVENOUS
  Filled 2016-05-28: qty 200

## 2016-05-28 MED ORDER — VANCOMYCIN HCL 500 MG IV SOLR
INTRAVENOUS | Status: AC
  Filled 2016-05-28: qty 500

## 2016-05-28 MED ORDER — CHLORHEXIDINE GLUCONATE 4 % EX LIQD: 60.0000 mL | Freq: Once | CUTANEOUS | Status: DC

## 2016-05-28 MED ORDER — LOSARTAN POTASSIUM-HCTZ 100-25 MG PO TABS: 1.0000 | ORAL_TABLET | Freq: Every day | ORAL | Status: DC

## 2016-05-28 MED ORDER — VANCOMYCIN HCL 500 MG IV SOLR
INTRAVENOUS | Status: DC | PRN
  Administered 2016-05-28: 500 mg via TOPICAL
  Administered 2016-05-28: 500 mg

## 2016-05-28 MED ORDER — ONDANSETRON HCL 4 MG/2ML IJ SOLN
4.0000 mg | Freq: Four times a day (QID) | INTRAMUSCULAR | Status: DC | PRN
  Administered 2016-05-28 – 2016-05-29 (×2): 4 mg via INTRAVENOUS
  Filled 2016-05-28 (×2): qty 2

## 2016-05-28 MED ORDER — MENTHOL 3 MG MT LOZG: 1.0000 | LOZENGE | OROMUCOSAL | Status: DC | PRN

## 2016-05-28 MED ORDER — HYDROCHLOROTHIAZIDE 25 MG PO TABS
25.0000 mg | ORAL_TABLET | Freq: Every day | ORAL | Status: DC
  Administered 2016-05-29: 25 mg via ORAL
  Filled 2016-05-28: qty 1

## 2016-05-28 MED ORDER — ONDANSETRON HCL 4 MG/2ML IJ SOLN
INTRAMUSCULAR | Status: DC | PRN
Start: 2016-05-28 — End: 2016-05-28
  Administered 2016-05-28: 4 mg via INTRAVENOUS

## 2016-05-28 MED ORDER — ONDANSETRON HCL 4 MG PO TABS
4.0000 mg | ORAL_TABLET | Freq: Four times a day (QID) | ORAL | Status: DC | PRN
  Administered 2016-05-29: 4 mg via ORAL
  Filled 2016-05-28: qty 1

## 2016-05-28 MED ORDER — FENTANYL CITRATE (PF) 100 MCG/2ML IJ SOLN
INTRAMUSCULAR | Status: AC
  Administered 2016-05-28: 100 ug
  Filled 2016-05-28: qty 2

## 2016-05-28 MED ORDER — LOSARTAN POTASSIUM 50 MG PO TABS
100.0000 mg | ORAL_TABLET | Freq: Every day | ORAL | Status: DC
  Administered 2016-05-29: 100 mg via ORAL
  Filled 2016-05-28: qty 2

## 2016-05-28 MED ORDER — ACETAMINOPHEN 650 MG RE SUPP: 650.0000 mg | Freq: Four times a day (QID) | RECTAL | Status: DC | PRN

## 2016-05-28 MED ORDER — CITALOPRAM HYDROBROMIDE 20 MG PO TABS
20.0000 mg | ORAL_TABLET | Freq: Every day | ORAL | Status: DC
  Administered 2016-05-29 – 2016-05-30 (×2): 20 mg via ORAL
  Filled 2016-05-28 (×2): qty 1

## 2016-05-28 MED ORDER — LIDOCAINE HCL (CARDIAC) 20 MG/ML IV SOLN
INTRAVENOUS | Status: DC | PRN
  Administered 2016-05-28: 100 mg via INTRAVENOUS

## 2016-05-28 MED ORDER — 0.9 % SODIUM CHLORIDE (POUR BTL) OPTIME
TOPICAL | Status: DC | PRN
  Administered 2016-05-28 (×4): 1000 mL

## 2016-05-28 MED ORDER — FENTANYL CITRATE (PF) 100 MCG/2ML IJ SOLN: 25.0000 ug | INTRAMUSCULAR | Status: DC | PRN

## 2016-05-28 MED ORDER — ATORVASTATIN CALCIUM 20 MG PO TABS
20.0000 mg | ORAL_TABLET | Freq: Every day | ORAL | Status: DC
  Administered 2016-05-28 – 2016-05-30 (×3): 20 mg via ORAL
  Filled 2016-05-28 (×3): qty 1

## 2016-05-28 MED ORDER — PHENOL 1.4 % MT LIQD
1.0000 | OROMUCOSAL | Status: DC | PRN
Start: 2016-05-28 — End: 2016-05-30

## 2016-05-28 MED ORDER — PANTOPRAZOLE SODIUM 40 MG PO TBEC
40.0000 mg | DELAYED_RELEASE_TABLET | Freq: Every day | ORAL | Status: DC
  Administered 2016-05-29 – 2016-05-30 (×2): 40 mg via ORAL
  Filled 2016-05-28 (×2): qty 1

## 2016-05-28 MED ORDER — LACTATED RINGERS IV SOLN
INTRAVENOUS | Status: DC
  Administered 2016-05-28 (×2): via INTRAVENOUS

## 2016-05-28 MED ORDER — SPIRONOLACTONE 25 MG PO TABS
25.0000 mg | ORAL_TABLET | Freq: Every day | ORAL | Status: DC
Start: 2016-05-29 — End: 2016-05-30
  Administered 2016-05-29: 25 mg via ORAL
  Filled 2016-05-28: qty 1

## 2016-05-28 MED ORDER — ACETAMINOPHEN 325 MG PO TABS
650.0000 mg | ORAL_TABLET | Freq: Four times a day (QID) | ORAL | Status: DC | PRN
  Administered 2016-05-30: 650 mg via ORAL
  Filled 2016-05-28: qty 2

## 2016-05-28 MED ORDER — VANCOMYCIN HCL IN DEXTROSE 1-5 GM/200ML-% IV SOLN
1000.0000 mg | Freq: Two times a day (BID) | INTRAVENOUS | Status: AC
  Administered 2016-05-28: 1000 mg via INTRAVENOUS
  Filled 2016-05-28: qty 200

## 2016-05-28 MED ORDER — DOCUSATE SODIUM 100 MG PO CAPS
100.0000 mg | ORAL_CAPSULE | Freq: Two times a day (BID) | ORAL | Status: DC
  Administered 2016-05-29: 100 mg via ORAL
  Filled 2016-05-28 (×2): qty 1

## 2016-05-28 MED ORDER — POTASSIUM CHLORIDE IN NACL 20-0.9 MEQ/L-% IV SOLN
INTRAVENOUS | Status: AC
  Administered 2016-05-28 – 2016-05-29 (×2): via INTRAVENOUS
  Filled 2016-05-28 (×2): qty 1000

## 2016-05-28 MED ORDER — HYDROCODONE-ACETAMINOPHEN 5-325 MG PO TABS
1.0000 | ORAL_TABLET | ORAL | Status: DC | PRN
  Administered 2016-05-28 – 2016-05-30 (×3): 1 via ORAL
  Filled 2016-05-28 (×3): qty 1

## 2016-05-28 MED ORDER — ROCURONIUM BROMIDE 100 MG/10ML IV SOLN
INTRAVENOUS | Status: DC | PRN
  Administered 2016-05-28: 50 mg via INTRAVENOUS

## 2016-05-28 MED ORDER — EPHEDRINE SULFATE 50 MG/ML IJ SOLN
INTRAMUSCULAR | Status: DC | PRN
  Administered 2016-05-28: 10 mg via INTRAVENOUS

## 2016-05-28 MED ORDER — PROPOFOL 10 MG/ML IV BOLUS
INTRAVENOUS | Status: DC | PRN
  Administered 2016-05-28: 100 mg via INTRAVENOUS

## 2016-05-28 MED ORDER — DILTIAZEM HCL ER COATED BEADS 240 MG PO CP24
240.0000 mg | ORAL_CAPSULE | Freq: Every day | ORAL | Status: DC
  Administered 2016-05-29: 240 mg via ORAL
  Filled 2016-05-28: qty 1

## 2016-05-28 MED ORDER — MIDAZOLAM HCL 2 MG/2ML IJ SOLN
INTRAMUSCULAR | Status: AC
  Administered 2016-05-28: 12:00:00
  Filled 2016-05-28: qty 2

## 2016-05-28 SURGICAL SUPPLY — 74 items
BASEPLATE GLENOID SHLDR SM (Shoulder) ×2 IMPLANT
BLADE SAW SGTL 13X75X1.27 (BLADE) ×3 IMPLANT
BSPLAT GLND SM PRFT SHLDR CA (Shoulder) ×1 IMPLANT
BUR MATCHSTICK NEURO 3.0 LAGG (BURR) ×3 IMPLANT
CEMENT HV SMART SET (Cement) ×2 IMPLANT
CEMENT RESTRICTOR DEPUY SZ 1 (Cement) IMPLANT
CLOSURE WOUND 1/2 X4 (GAUZE/BANDAGES/DRESSINGS) ×1
COVER SURGICAL LIGHT HANDLE (MISCELLANEOUS) ×3 IMPLANT
COVER TABLE BACK 60X90 (DRAPES) IMPLANT
DRAPE C-ARM 42X72 X-RAY (DRAPES) IMPLANT
DRAPE IMP U-DRAPE 54X76 (DRAPES) IMPLANT
DRAPE INCISE IOBAN 66X45 STRL (DRAPES) ×6 IMPLANT
DRAPE U-SHAPE 47X51 STRL (DRAPES) ×6 IMPLANT
DRSG AQUACEL AG ADV 3.5X10 (GAUZE/BANDAGES/DRESSINGS) ×3 IMPLANT
DURAPREP 26ML APPLICATOR (WOUND CARE) ×5 IMPLANT
ELECT BLADE 6.5 EXT (BLADE) ×2 IMPLANT
ELECT REM PT RETURN 9FT ADLT (ELECTROSURGICAL) ×3
ELECTRODE REM PT RTRN 9FT ADLT (ELECTROSURGICAL) ×1 IMPLANT
GLENOID VIP GUIDE MODEL 3D (SYSTAGENIX WOUND MANAGEMENT) ×2 IMPLANT
GLENOSPHERE LATERAL 36MM+4 (Shoulder) ×2 IMPLANT
GLOVE BIOGEL PI IND STRL 7.5 (GLOVE) ×1 IMPLANT
GLOVE BIOGEL PI IND STRL 8 (GLOVE) ×1 IMPLANT
GLOVE BIOGEL PI INDICATOR 7.5 (GLOVE)
GLOVE BIOGEL PI INDICATOR 8 (GLOVE) ×2
GLOVE ECLIPSE 7.0 STRL STRAW (GLOVE) ×1 IMPLANT
GLOVE SURG ORTHO 8.0 STRL STRW (GLOVE) ×3 IMPLANT
GOWN STRL REUS W/ TWL LRG LVL3 (GOWN DISPOSABLE) ×2 IMPLANT
GOWN STRL REUS W/ TWL XL LVL3 (GOWN DISPOSABLE) ×1 IMPLANT
GOWN STRL REUS W/TWL LRG LVL3 (GOWN DISPOSABLE) ×6
GOWN STRL REUS W/TWL XL LVL3 (GOWN DISPOSABLE) ×3
KIT BASIN OR (CUSTOM PROCEDURE TRAY) ×3 IMPLANT
KIT BEACH CHAIR TRIMANO (Kit) ×3 IMPLANT
KIT ROOM TURNOVER OR (KITS) ×3 IMPLANT
LINER HUMERAL 36 +3MM SM (Shoulder) ×2 IMPLANT
LOOP VESSEL MAXI BLUE (MISCELLANEOUS) ×2 IMPLANT
MANIFOLD NEPTUNE II (INSTRUMENTS) ×3 IMPLANT
NDL HYPO 25GX1X1/2 BEV (NEEDLE) IMPLANT
NDL SUT 6 .5 CRC .975X.05 MAYO (NEEDLE) ×1 IMPLANT
NEEDLE HYPO 25GX1X1/2 BEV (NEEDLE) IMPLANT
NEEDLE MAYO TAPER (NEEDLE) ×3
NS IRRIG 1000ML POUR BTL (IV SOLUTION) ×13 IMPLANT
PACK SHOULDER (CUSTOM PROCEDURE TRAY) ×3 IMPLANT
PAD ARMBOARD 7.5X6 YLW CONV (MISCELLANEOUS) ×6 IMPLANT
RESTRICTOR CEMENT PE SZ 2 (Cement) ×2 IMPLANT
SCREW CENTRAL NONLOCK 25MM (Screw) ×2 IMPLANT
SCREW LOCK PERIPHERAL 30MM (Shoulder) ×4 IMPLANT
SET PIN UNIVERSAL REVERSE (SET/KITS/TRAYS/PACK) ×2 IMPLANT
SLING ARM IMMOBILIZER LRG (SOFTGOODS) ×2 IMPLANT
SPACER SHLD UNI REV 36+9 (Spacer) ×1 IMPLANT
SPACER UNIVERS REVERS 36+9MM (Spacer) ×1 IMPLANT
SPONGE LAP 18X18 X RAY DECT (DISPOSABLE) ×5 IMPLANT
SPONGE LAP 4X18 X RAY DECT (DISPOSABLE) ×4 IMPLANT
STEM REVERSE SIZE 5-135 (Shoulder) ×2 IMPLANT
STRIP CLOSURE SKIN 1/2X4 (GAUZE/BANDAGES/DRESSINGS) ×1 IMPLANT
SUCTION FRAZIER HANDLE 10FR (MISCELLANEOUS) ×2
SUCTION TUBE FRAZIER 10FR DISP (MISCELLANEOUS) ×1 IMPLANT
SUT FIBERWIRE #2 38 REV NDL BL (SUTURE) ×6
SUT FIBERWIRE #2 38 T-5 BLUE (SUTURE) ×3
SUT MAXBRAID (SUTURE) IMPLANT
SUT MNCRL AB 3-0 PS2 18 (SUTURE) ×3 IMPLANT
SUT VIC AB 0 CTB1 27 (SUTURE) ×10 IMPLANT
SUT VIC AB 1 CT1 27 (SUTURE) ×3
SUT VIC AB 1 CT1 27XBRD ANBCTR (SUTURE) ×1 IMPLANT
SUT VIC AB 2-0 CT1 27 (SUTURE) ×12
SUT VIC AB 2-0 CT1 TAPERPNT 27 (SUTURE) ×3 IMPLANT
SUT VICRYL 0 TIES 12 18 (SUTURE) ×3 IMPLANT
SUTURE FIBERWR #2 38 T-5 BLUE (SUTURE) ×1 IMPLANT
SUTURE FIBERWR#2 38 REV NDL BL (SUTURE) IMPLANT
SYR CONTROL 10ML LL (SYRINGE) IMPLANT
TAPE FIBER 2MM 7IN #2 BLUE (SUTURE) ×6 IMPLANT
TOWEL OR 17X24 6PK STRL BLUE (TOWEL DISPOSABLE) ×3 IMPLANT
TOWEL OR 17X26 10 PK STRL BLUE (TOWEL DISPOSABLE) ×3 IMPLANT
TRAY FOLEY BAG SILVER LF 16FR (CATHETERS) IMPLANT
WATER STERILE IRR 1000ML POUR (IV SOLUTION) ×1 IMPLANT

## 2016-05-28 NOTE — Anesthesia Procedure Notes (Signed)
Anesthesia Regional Block:  Interscalene brachial plexus block  Pre-Anesthetic Checklist: ,, timeout performed, Correct Patient, Correct Site, Correct Laterality, Correct Procedure, Correct Position,,,,, at surgeon's request and post-op pain management  Laterality: Right  Prep: chloraprep       Needles:   Needle Type: Echogenic Needle     Needle Length: 5cm 5 cm Needle Gauge: 21 and 21 G    Additional Needles:  Procedures: ultrasound guided (picture in chart) Interscalene brachial plexus block Narrative:  Injection made incrementally with aspirations every 3 mL. Anesthesiologist: Lyndle Herrlich  Additional Notes: 20cc .5% Naropin

## 2016-05-28 NOTE — Anesthesia Procedure Notes (Signed)
Procedure Name: Intubation Date/Time: 05/28/2016 1:07 PM Performed by: Manuela Schwartz B Pre-anesthesia Checklist: Patient identified, Suction available, Emergency Drugs available, Patient being monitored and Timeout performed Patient Re-evaluated:Patient Re-evaluated prior to inductionOxygen Delivery Method: Circle system utilized Preoxygenation: Pre-oxygenation with 100% oxygen Intubation Type: IV induction Ventilation: Mask ventilation without difficulty and Oral airway inserted - appropriate to patient size Laryngoscope Size: Mac and 3 Grade View: Grade I Tube type: Oral Tube size: 7.0 mm Number of attempts: 1 Airway Equipment and Method: Stylet Placement Confirmation: ETT inserted through vocal cords under direct vision,  positive ETCO2 and breath sounds checked- equal and bilateral Secured at: 21 cm Tube secured with: Tape Dental Injury: Teeth and Oropharynx as per pre-operative assessment

## 2016-05-28 NOTE — Brief Op Note (Signed)
05/28/2016  5:11 PM  PATIENT:  Tonya [name]  75 y.o. female  PRE-OPERATIVE DIAGNOSIS:  RIGHT PROXIMAL HUMERUS FRACTURE  POST-OPERATIVE DIAGNOSIS:  RIGHT PROXIMAL HUMERUS FRACTURE  PROCEDURE:  Procedure(s): REVERSE SHOULDER ARTHROPLASTY  SURGEON:  Surgeon(s): Meredith Pel, MD  ASSISTANT: April Green RNFA  ANESTHESIA:   general  EBL: 100 ml    Total I/O In: 1500 [I.V.:1500] Out: 350 [Urine:300; Blood:50]  BLOOD ADMINISTERED: none  DRAINS: none   LOCAL MEDICATIONS USED:  none  SPECIMEN:  No Specimen  COUNTS:  YES  TOURNIQUET:  * No tourniquets in log *  DICTATION: .Other Dictation: Dictation Number [id number]  PLAN OF CARE: Admit to inpatient   PATIENT DISPOSITION:  PACU - hemodynamically stable

## 2016-05-28 NOTE — Transfer of Care (Signed)
Immediate Anesthesia Transfer of Care Note  Patient: Tonya [name]  Procedure(s) Performed: Procedure(s): REVERSE SHOULDER ARTHROPLASTY (Right)  Patient Location: PACU  Anesthesia Type:GA combined with regional for post-op pain  Level of Consciousness: awake and alert   Airway & Oxygen Therapy: Patient Spontanous Breathing and Patient connected to nasal cannula oxygen  Post-op Assessment: Report given to RN and Post -op Vital signs reviewed and stable  Post vital signs: Reviewed and stable  Last Vitals:  Vitals:   05/28/16 1151 05/28/16 1715  BP: (!) 124/43 (!) 126/51  Pulse: (!) 57 69  Resp: 11 12  Temp:  36.8 C    Last Pain:  Vitals:   05/28/16 1025  TempSrc: Oral         Complications: No apparent anesthesia complications

## 2016-05-28 NOTE — Anesthesia Preprocedure Evaluation (Addendum)
Anesthesia Evaluation  Patient identified by MRN, date of birth, ID band Patient awake    Reviewed: Allergy & Precautions, H&P , NPO status , Patient's Chart, lab work & pertinent test results, reviewed documented beta blocker date and time   History of Anesthesia Complications Negative for: history of anesthetic complications  Airway Mallampati: II  TM Distance: >3 FB Neck ROM: full    Dental no notable dental hx. (+) Edentulous Upper, Poor Dentition, Chipped, Missing   Pulmonary    Pulmonary exam normal breath sounds clear to auscultation       Cardiovascular hypertension, Pt. on medications + CAD and + Cardiac Stents  + dysrhythmias  Rhythm:regular Rate:Normal  Echo 2010: Overall left ventricular systolic function was normal. Left ventricular ejection fraction was estimated , range being 60% to 65 %. There were no left ventricular regional wall motion abnormalities.   Neuro/Psych Anxiety Depression    GI/Hepatic GERD  ,  Endo/Other  diabetes, Type 2, Oral Hypoglycemic Agents  Renal/GU Renal InsufficiencyRenal disease     Musculoskeletal   Abdominal   Peds  Hematology   Anesthesia Other Findings   Reproductive/Obstetrics                            Anesthesia Physical Anesthesia Plan  ASA: II  Anesthesia Plan: General   Post-op Pain Management: GA combined w/ Regional for post-op pain   Induction: Intravenous  Airway Management Planned: Oral ETT  Additional Equipment:   Intra-op Plan:   Post-operative Plan: Extubation in OR  Informed Consent: I have reviewed the patients History and Physical, chart, labs and discussed the procedure including the risks, benefits and alternatives for the proposed anesthesia with the patient or authorized representative who has indicated his/her understanding and acceptance.   Dental Advisory Given and Dental advisory given  Plan Discussed  with: CRNA and Surgeon  Anesthesia Plan Comments: (  Discussed general anesthesia, including possible nausea, instrumentation of airway, sore throat,pulmonary aspiration, etc. I asked if the were any outstanding questions, or  concerns before we proceeded. )        Anesthesia Quick Evaluation

## 2016-05-28 NOTE — H&P (Signed)
Tonya [name] is an 75 y.o. female.   Chief Complaint: Right shoulder pain HPI: Tonya [name] is a 48 showed female with right shoulder pain.  She sustained a fall several weeks ago and has a four-part proximal humerus fracture.  This does extend into the joint by CT scanning.  She presents for operative management after explanation of risks and benefits she has received cardiac risk stratification.  Past Medical History:  Diagnosis Date  . Adenocarcinoma, colon (Tonya [name])   . Anemia    iron deficiency  . Anxiety   . Carpal tunnel syndrome   . Chronic diarrhea   . Colon polyps    Tubular adenoma  . Degenerative disk disease   . Depression   . Diabetes mellitus, type 2 ([place])   . Dysrhythmia    h/o AFIB  . Fall at home 03/16/2014   no fx; multiple facial lacerations, abrasions and hematoma  . GERD (gastroesophageal reflux disease)   . Headache   . Hyperlipidemia   . Hypertension   . IBS (irritable bowel syndrome)   . Osteoarthritis   . S/P coronary artery stent placement 2007  . Sciatica     Past Surgical History:  Procedure Laterality Date  . CARDIAC CATHETERIZATION  12/10   LAD stent patent. Insignificant CAD, otherwise EF 60-65%  . CARPAL TUNNEL RELEASE    . CATARACT EXTRACTION  03/2015  . CHOLECYSTECTOMY    . DILATION AND CURETTAGE OF UTERUS    . HEMATOMA EVACUATION     after knee replacement  . KNEE ARTHROSCOPY     bilateral  . LUMBAR LAMINECTOMY N/A 11/26/2015   Procedure: LUMBAR DECOMPRESSIVE LAMINECTOMY L3-4 AND L4-5, LEFT L2-3 HEMILAMINECTOMY;  Surgeon: Jessy Oto, MD;  Location: [place];  Service: Orthopedics;  Laterality: N/A;  . PARTIAL COLECTOMY  11/2008   right, for adenocarcinoma  . REPLACEMENT TOTAL KNEE  [id number]   right, complicated by hemarthrosis   . TONSILLECTOMY    . TOTAL KNEE ARTHROPLASTY Left 04/03/2014   dr whitfield  . TOTAL KNEE ARTHROPLASTY Left 04/03/2014   Procedure: TOTAL KNEE ARTHROPLASTY;  Surgeon: Garald Balding, MD;  Location: [place];   Service: Orthopedics;  Laterality: Left;  . TOTAL KNEE REVISION  03/08/2012   Procedure: TOTAL KNEE REVISION;  Surgeon: Garald Balding, MD;  Location: [place];  Service: Orthopedics;  Laterality: Right;  removal total knee hardware and placement of antibiotic cement spacer and antibiotic beads  . TOTAL KNEE REVISION  05/17/2012   Procedure: TOTAL KNEE REVISION;  Surgeon: Garald Balding, MD;  Location: [place];  Service: Orthopedics;  Laterality: Right;  right total knee revision, removal of antibiotic spacer    Family History  Problem Relation Age of Onset  . Heart disease Mother     massive MI age 43  . Heart attack Mother   . Heart disease Father     Massive MI age 26  . Prostate cancer Father   . Colon cancer Neg Hx    Social History:  reports that she has never smoked. She has never used smokeless tobacco. She reports that she does not drink alcohol or use drugs.  Allergies:  Allergies  Allergen Reactions  . Amoxicillin-Pot Clavulanate Swelling and Other (See Comments)    Augmentin per patient, tongue swelling    No prescriptions prior to admission.    No results found for this or any previous visit (from the past 48 hour(s)). No results found.  ROS all systems reviewed are  negative except for right shoulder pain  There were no vitals taken for this visit. Physical Exam  On examination the patient is well-developed well-nourished distress alert and oriented neck is supple extraocular movements intact affect normal mood and normal skin normal no lymphadenopathy is present breast story effort normal heart rate normal right shoulder demonstrates some bruising and swelling in the proximal shoulder region wrist elbow range of motion is intact EPL FPL interosseous strength is intact on the right-hand side deltoid does fire on the right Assessment/Plan Impression is four-part proximal humerus fracture with displacement and head splitting component land reverse shoulder replacement  risks and benefits discussed with the patient could not limited to infection or vessel damage dislocation potential need for more surgery.  All questions answered  Meredith Pel, MD 05/28/2016, 8:12 AM

## 2016-05-29 ENCOUNTER — Inpatient Hospital Stay (HOSPITAL_COMMUNITY): Payer: Medicare Other

## 2016-05-29 MED ORDER — OXYCODONE HCL 5 MG PO TABS: 5.0000 mg | ORAL_TABLET | ORAL | 0 refills | Status: DC | PRN

## 2016-05-29 MED ORDER — METHOCARBAMOL 500 MG PO TABS: 500.0000 mg | ORAL_TABLET | Freq: Three times a day (TID) | ORAL | 0 refills | Status: DC | PRN

## 2016-05-29 MED ORDER — OXYCODONE HCL 5 MG PO TABS
5.0000 mg | ORAL_TABLET | ORAL | Status: DC | PRN
  Administered 2016-05-29 (×4): 10 mg via ORAL
  Filled 2016-05-29 (×4): qty 2

## 2016-05-29 NOTE — Anesthesia Postprocedure Evaluation (Signed)
Anesthesia Post Note  Patient: Tonya [name]  Procedure(s) Performed: Procedure(s) (LRB): REVERSE SHOULDER ARTHROPLASTY (Right)  Patient location during evaluation: PACU Anesthesia Type: General Level of consciousness: sedated Pain management: pain level controlled Vital Signs Assessment: post-procedure vital signs reviewed and stable Respiratory status: spontaneous breathing and respiratory function stable Cardiovascular status: stable Anesthetic complications: no              Aliani Caccavale DANIEL

## 2016-05-29 NOTE — Evaluation (Signed)
Occupational Therapy Evaluation Patient Details Name: Tonya [name] MRN: [id number] DOB: [birth year] Today's Date: 05/29/2016    History of Present Illness Pt fell one month ago fracturing R proximal humerus. s/p R reverse TSA 05/28/16. PMH; B TKA, back sx, CAD, anxiety/depression, DM, CKD.   Clinical Impression   Pt has been managing ADL and ambulating without a device since her fall. Limited evaluation session today due to pt with N/V. Pt unsteady on her feet requiring hand held assistance. Began education in sling use, UB dressing, positioning R UE in bed and chair and elbow>hand AROM. Will follow acutely.    Follow Up Recommendations  Home health OT (HHPT)    Equipment Recommendations  None recommended by OT    Recommendations for Other Services       Precautions / Restrictions Precautions Precautions: Shoulder Type of Shoulder Precautions: passive protocol Shoulder Interventions: Shoulder sling/immobilizer Precaution Booklet Issued: Yes (comment) Required Braces or Orthoses: Sling Restrictions Weight Bearing Restrictions: Yes RUE Weight Bearing: Non weight bearing      Mobility Bed Mobility Overal bed mobility: Needs Assistance Bed Mobility: Supine to Sit     Supine to sit: Supervision;HOB elevated     General bed mobility comments: increased time, heavy use of rail  Transfers Overall transfer level: Needs assistance   Transfers: Sit to/from Stand;Stand Pivot Transfers Sit to Stand: Min assist Stand pivot transfers: Min assist       General transfer comment: hand held assist    Balance Overall balance assessment: Needs assistance   Sitting balance-Leahy Scale: Fair       Standing balance-Leahy Scale: Poor                              ADL Overall ADL's : Needs assistance/impaired Eating/Feeding: Set up;Bed level   Grooming: Wash/dry face;Sitting;Set up           Upper Body Dressing : Total assistance;Sitting       Toilet  Transfer: Minimal assistance;Stand-pivot;RW   Toileting- Clothing Manipulation and Hygiene: Total assistance;Sit to/from stand         General ADL Comments: Educated pt in sling use and wearing schedule, positioning R UE in bed and chair and elbow>AROM.      Vision     Perception     Praxis      Pertinent Vitals/Pain       Hand Dominance Right   Extremity/Trunk Assessment Upper Extremity Assessment Upper Extremity Assessment: RUE deficits/detail RUE Deficits / Details: performed elbow, forearm, wrist and hand x 5   Lower Extremity Assessment Lower Extremity Assessment: Generalized weakness   Cervical / Trunk Assessment Cervical / Trunk Assessment:  (back sx last April)   Communication Communication Communication: No difficulties   Cognition Arousal/Alertness: Awake/alert Behavior During Therapy: WFL for tasks assessed/performed Overall Cognitive Status: Within Functional Limits for tasks assessed                     General Comments       Exercises       Shoulder Instructions      Home Living Family/patient expects to be discharged to:: Private residence Living Arrangements: Children;Other relatives Available Help at Discharge: Family;Available PRN/intermittently Type of Home: House Home Access: Level entry     Home Layout: Two level;Able to live on main level with bedroom/bathroom     Bathroom Shower/Tub: Tub/shower unit Shower/tub characteristics: Architectural technologist: Standard  Home Equipment: Kasandra Knudsen - single point;Shower seat;Grab bars - tub/shower;Walker - 2 wheels;Hand held shower head          Prior Functioning/Environment Level of Independence: Independent        Comments: drives    OT Diagnosis: Generalized weakness;Acute pain   OT Problem List: Decreased strength;Decreased activity tolerance;Impaired balance (sitting and/or standing);Decreased coordination;Impaired UE functional use;Pain   OT  Treatment/Interventions: Self-care/ADL training;Therapeutic exercise;DME and/or AE instruction;Therapeutic activities;Patient/family education    OT Goals(Current goals can be found in the care plan section) Acute Rehab OT Goals Patient Stated Goal: return home OT Goal Formulation: With patient Time For Goal Achievement: 06/05/16 Potential to Achieve Goals: Good ADL Goals Pt Will Transfer to Toilet: with supervision;ambulating Pt/caregiver will Perform Home Exercise Program:  (Pt wil perform R pendulums and AROM elbow>hand independently) Additional ADL Goal #1: Pt will perform bathing and dressing with supervision using compensatory strategies. Additional ADL Goal #2: Pt and caregiver will be independent in donning and doffing sling.  OT Frequency: Min 2X/week   Barriers to D/C:            Co-evaluation              End of Session Equipment Utilized During Treatment:  (sling) Nurse Communication:  (provided medication for nausea)  Activity Tolerance: Treatment limited secondary to medical complications (Comment) (vomiting) Patient left: in chair;with call bell/phone within reach;with nursing/sitter in room   Time: [id number] OT Time Calculation (min): 43 min Charges:  OT General Charges $OT Visit: 1 Procedure OT Evaluation $OT Eval Moderate Complexity: 1 Procedure OT Treatments $Self Care/Home Management : 8-22 mins $Therapeutic Exercise: 8-22 mins G-Codes:    Tonya [name] 05/29/2016, 10:23 AM  [phone]

## 2016-05-29 NOTE — Progress Notes (Signed)
Subjective: Patient stable pain controlled   Objective: Vital signs in last 24 hours: Temp:  [97.9 F (36.6 C)-100.1 F (37.8 C)] 99.5 F (37.5 C) (09/01 0356) Pulse Rate:  [57-81] 81 (09/01 0356) Resp:  [11-20] 16 (09/01 0356) BP: (120-151)/(43-56) 151/56 (09/01 0356) SpO2:  [96 %-100 %] 97 % (09/01 0356) Weight:  [69.9 kg (154 lb)] 69.9 kg (154 lb) (08/31 2233)  Intake/Output from previous day: 08/31 0701 - 09/01 0700 In: 1500 [I.V.:1500] Out: 750 [Urine:700; Blood:50] Intake/Output this shift: No intake/output data recorded.  Exam:  Sensation intact distally Intact pulses distally  Labs: No results for input(s): HGB in the last 72 hours. No results for input(s): WBC, RBC, HCT, PLT in the last 72 hours. No results for input(s): NA, K, CL, CO2, BUN, CREATININE, GLUCOSE, CALCIUM in the last 72 hours. No results for input(s): LABPT, INR in the last 72 hours.  Assessment/Plan: Plan OT today.  Anticipate discharge tomorrow with home health physical therapy and CPM machine   Tonya [name] SCOTT 05/29/2016, 7:20 AM

## 2016-05-30 MED ORDER — CHOLESTYRAMINE LIGHT 4 G PO PACK
4.0000 g | PACK | Freq: Two times a day (BID) | ORAL | Status: DC
  Administered 2016-05-30: 4 g via ORAL
  Filled 2016-05-30 (×2): qty 1

## 2016-05-30 NOTE — Progress Notes (Signed)
Discharge instructions gave to pt and her daughter. Pt received her paper proscriptions and all questions answered. Pt is done with OT and ready to discharge.

## 2016-05-30 NOTE — Progress Notes (Signed)
OT recommended HHPT, called CM Judson Roch, she notified me that pt need face to face order. This RN paged MD and Dr. Ninfa Linden acknowledged and agree to put the order in.

## 2016-05-30 NOTE — Progress Notes (Signed)
Occupational Therapy Treatment/Discharge Patient Details Name: Tonya [name] MRN: [id number] DOB: [birth year] Today's Date: 05/30/2016    History of present illness Pt fell one month ago fracturing R proximal humerus. s/p R reverse TSA 05/28/16. PMH; B TKA, back sx, CAD, anxiety/depression, DM, CKD.   OT comments  Reviewed shoulder precautions, sling wear protocol, compensatory strategies for UB ADLs, positioning for rest in chair or bed, HEP, pain/edema management, and fall prevention strategies with pt and daughter. All education has been completed and pt has no further questions. Pt with no further acute OT needs. OT signing off.   Follow Up Recommendations  Home health OT (PT)    Equipment Recommendations  None recommended by OT    Recommendations for Other Services      Precautions / Restrictions Precautions Precautions: Shoulder Type of Shoulder Precautions: passive protocol Shoulder Interventions: Shoulder sling/immobilizer;At all times Precaution Booklet Issued: Yes (comment) Precaution Comments: Reviewed precautions and handout with pt and daughter Required Braces or Orthoses: Sling Restrictions Weight Bearing Restrictions: Yes RUE Weight Bearing: Non weight bearing       Mobility Bed Mobility               General bed mobility comments: Pt up in chair on OT arrival  Transfers                      Balance Overall balance assessment: Needs assistance Sitting-balance support: No upper extremity supported;Feet supported Sitting balance-Leahy Scale: Good                             ADL Overall ADL's : Needs assistance/impaired Eating/Feeding: Set up;Sitting   Grooming: Wash/dry hands;Wash/dry face;Set up;Sitting   Upper Body Bathing: Moderate assistance;Sitting;With caregiver independent assisting   Lower Body Bathing: Minimal assistance;With caregiver independent assisting;Sit to/from stand   Upper Body Dressing : Moderate  assistance;With caregiver independent assisting;Sitting   Lower Body Dressing: Minimal assistance;With caregiver independent assisting;Sit to/from stand                 General ADL Comments: Reviewed shoulder precautions, sling wear protocol, compensatory strategies for UB ADLs, positioning for rest, RUE HEP, and fall prevention strategies with pt and pt's daughter.      Vision                     Perception     Praxis      Cognition   Behavior During Therapy: [facility] for tasks assessed/performed Overall Cognitive Status: Within Functional Limits for tasks assessed                       Extremity/Trunk Assessment               Exercises Shoulder Exercises Elbow Flexion: AROM;Right;10 reps;Seated Elbow Extension: AROM;Right;10 reps;Seated Wrist Flexion: AROM;Right;10 reps;Seated Wrist Extension: AROM;Right;10 reps;Seated Digit Composite Flexion: AROM;Right;10 reps;Seated Composite Extension: AROM;Right;10 reps;Seated Donning/doffing shirt without moving shoulder: Moderate assistance;Caregiver independent with task Method for sponge bathing under operated UE: Moderate assistance;Caregiver independent with task Donning/doffing sling/immobilizer: Minimal assistance;Caregiver independent with task Correct positioning of sling/immobilizer: Minimal assistance;Caregiver independent with task ROM for elbow, wrist and digits of operated UE: Supervision/safety;Caregiver independent with task Sling wearing schedule (on at all times/off for ADL's): Supervision/safety;Caregiver independent with task Proper positioning of operated UE when showering: Supervision/safety;Caregiver independent with task Positioning of UE while sleeping: Supervision/safety;Caregiver independent with task   Shoulder Instructions  Shoulder Instructions Donning/doffing shirt without moving shoulder: Moderate assistance;Caregiver independent with task Method for sponge bathing under operated  UE: Moderate assistance;Caregiver independent with task Donning/doffing sling/immobilizer: Minimal assistance;Caregiver independent with task Correct positioning of sling/immobilizer: Minimal assistance;Caregiver independent with task ROM for elbow, wrist and digits of operated UE: Supervision/safety;Caregiver independent with task Sling wearing schedule (on at all times/off for ADL's): Supervision/safety;Caregiver independent with task Proper positioning of operated UE when showering: Supervision/safety;Caregiver independent with task Positioning of UE while sleeping: Supervision/safety;Caregiver independent with task     General Comments      Pertinent Vitals/ Pain       Pain Assessment: Faces Faces Pain Scale: Hurts little more Pain Location: R shoulder Pain Descriptors / Indicators: Aching Pain Intervention(s): Limited activity within patient's tolerance;Monitored during session;Premedicated before session;Repositioned  Home Living                                          Prior Functioning/Environment              Frequency       Progress Toward Goals  OT Goals(current goals can now be found in the care plan section)  Progress towards OT goals: Goals met/education completed, patient discharged from OT  Acute Rehab OT Goals Patient Stated Goal: return home OT Goal Formulation: With patient Time For Goal Achievement: 06/05/16 Potential to Achieve Goals: Good ADL Goals Pt Will Transfer to Toilet: with supervision;ambulating Pt/caregiver will Perform Home Exercise Program:  (PPt will perform R pendulums and AROM elbow>hand independent) Additional ADL Goal #1: Pt will perform bathing and dressing with supervision using compensatory strategies. Additional ADL Goal #2: Pt and caregiver will be independent in donning and doffing sling.  Plan All goals met and education completed, patient discharged from OT services    Co-evaluation                  End of Session Equipment Utilized During Treatment: Other (comment) (sling)   Activity Tolerance Patient tolerated treatment well   Patient Left in chair;with call bell/phone within reach;with family/visitor present   Nurse Communication Mobility status;Other (comment) (pt ready for d/c)        Time: 9200-4159 OT Time Calculation (min): 17 min  Charges: OT General Charges $OT Visit: 1 Procedure OT Treatments $Self Care/Home Management : 8-22 mins  Redmond Baseman, OTR/L Pager: [phone] 05/30/2016, 2:09 PM

## 2016-05-30 NOTE — Plan of Care (Signed)
Problem: Increased Nutrient Needs (NI-5.1) Goal: Food and/or nutrient delivery Individualized approach for food/nutrient provision. Outcome: Completed/Met Date Met: 05/30/16 Nutrition [place] [facility] Stay Proper nutrition can help your body recover from illness and injury.   Foods and beverages high in protein, vitamins, and minerals help rebuild muscle loss, promote healing, & reduce fall risk.   .In addition to eating healthy foods, a nutrition shake is an easy, delicious way to get the nutrition you need during and after your hospital stay  It is recommended that you continue to drink 3 bottles per day of:       Boost Breeze for at least 1 month (30 days) after your hospital stay   Tips for adding a nutrition shake into your routine: As allowed, drink one with vitamins or medications instead of water or juice Enjoy one as a tasty mid-morning or afternoon snack Drink cold or make a milkshake out of it Drink one instead of milk with cereal or snacks Use as a coffee creamer   Available at the following grocery stores and pharmacies:           * [place] [phone]            For COUPONS visit: www.ensure.com/join or http://dawson-may.com/   Suggested Substitutions Ensure Plus = Boost Plus = Carnation Breakfast Essentials = Boost Compact Ensure Active Clear = Boost Breeze Glucerna Shake = Boost Glucose Control = Carnation Breakfast Essentials SUGAR FREE

## 2016-05-30 NOTE — Care Management Note (Addendum)
Case Management Note  Patient Details  Name: Tonya [name] MRN: [id number] Date of Birth: Jan 04, 1941  Subjective/Objective:  75 y.o. F admitted with Proximal Humerus Fx 05/28/2016. To be discharged with HHPT provided by [facility]. (faxed to [facility] [phone]) with confirmation.                   Action/Plan: Anticipate discharge home today. No further CM needs but will be available should additional discharge needs arise.   Expected Discharge Date:                  Expected Discharge Plan:  [place]  In-House Referral:  NA  Discharge planning Services  CM Consult  Post Acute Care Choice:  NA Choice offered to:  Patient  DME Arranged:  N/A (Has DME at Home) DME Agency:  NA  HH Arranged:  PT [place] Agency:  [place]  Status of Service:  Completed, signed off  If discussed at [place] of Stay Meetings, dates discussed:    Additional Comments:  Delrae Sawyers, RN 05/30/2016, 4:50 PM

## 2016-05-30 NOTE — Progress Notes (Signed)
OT Cancellation Note  Patient Details Name: ADAMARIE CARRUBBA MRN: [id number] DOB: [birth year]   Cancelled Treatment:    Reason Eval/Treat Not Completed: Medical issues which prohibited therapy - pt with severe n/v this AM and requested therapist come back later when feeling better.  Redmond Baseman, OTR/L Pager: [phone] 05/30/2016, 8:28 AM

## 2016-05-30 NOTE — Discharge Summary (Signed)
Patient ID: Tonya [name] MRN: [id number] DOB/AGE: 1941-08-25 75 y.o.  Admit date: 05/28/2016 Discharge date: 05/30/2016  Admission Diagnoses:  Active Problems:   Proximal humerus fracture   Discharge Diagnoses:  Same  Past Medical History:  Diagnosis Date  . Adenocarcinoma, colon ([place])   . Anemia    iron deficiency  . Anxiety   . Carpal tunnel syndrome   . Chronic diarrhea   . Colon polyps    Tubular adenoma  . Degenerative disk disease   . Depression   . Diabetes mellitus, type 2 ([place])   . Dysrhythmia    h/o AFIB  . Fall at home 03/16/2014   no fx; multiple facial lacerations, abrasions and hematoma  . GERD (gastroesophageal reflux disease)   . Headache   . Hyperlipidemia   . Hypertension   . IBS (irritable bowel syndrome)   . Osteoarthritis   . S/P coronary artery stent placement 2007  . Sciatica     Surgeries: Procedure(s): REVERSE SHOULDER ARTHROPLASTY on 05/28/2016   Consultants:   Discharged Condition: Improved  Hospital Course: Tonya [name] is an 75 y.o. female who was admitted 05/28/2016 for operative treatment of<principal problem not specified>. Patient has severe unremitting pain that affects sleep, daily activities, and work/hobbies. After pre-op clearance the patient was taken to the operating room on 05/28/2016 and underwent  Procedure(s): [place].    Patient was given perioperative antibiotics: Anti-infectives    Start     Dose/Rate Route Frequency Ordered Stop   05/28/16 1900  vancomycin (VANCOCIN) IVPB 1000 mg/200 mL premix     1,000 mg 200 mL/hr over 60 Minutes Intravenous Every 12 hours 05/28/16 1846 05/28/16 2035   05/28/16 1537  vancomycin (VANCOCIN) powder  Status:  Discontinued       As needed 05/28/16 1537 05/28/16 1711   05/28/16 1200  vancomycin (VANCOCIN) IVPB 1000 mg/200 mL premix     1,000 mg 200 mL/hr over 60 Minutes Intravenous 60 min pre-op 05/28/16 1026 05/28/16 1340       Patient was given  sequential compression devices, early ambulation, and chemoprophylaxis to prevent DVT.  Patient benefited maximally from hospital stay and there were no complications.    Recent vital signs: Patient Vitals for the past 24 hrs:  BP Temp Temp src Pulse Resp SpO2  05/30/16 0614 (!) 98/35 98.9 F (37.2 C) Oral 60 16 96 %  05/29/16 2123 (!) 93/34 - - - - 95 %  05/29/16 2116 (!) 89/38 99.3 F (37.4 C) Oral 64 16 (!) 88 %  05/29/16 2022 (!) 97/40 98.3 F (36.8 C) Oral 63 16 94 %  05/29/16 1300 (!) 148/52 99.1 F (37.3 C) Oral 79 16 98 %     Recent laboratory studies: No results for input(s): WBC, HGB, HCT, PLT, NA, K, CL, CO2, BUN, CREATININE, GLUCOSE, INR, CALCIUM in the last 72 hours.  Invalid input(s): PT, 2   Discharge Medications:     Medication List    STOP taking these medications   PREVALITE 4 g packet Generic drug:  cholestyramine light     TAKE these medications   aspirin 81 MG tablet Take 81 mg by mouth daily.   atorvastatin 20 MG tablet Commonly known as:  LIPITOR TAKE 1 TABLET BY MOUTH DAILY. What changed:  See the new instructions.   calcium-vitamin D 500-200 MG-UNIT tablet Commonly known as:  OSCAL WITH D Take 1 tablet by mouth 2 (two) times daily.   carvedilol 6.25 MG tablet Commonly  known as:  COREG Take 1 tablet (6.25 mg total) by mouth 2 (two) times daily with a meal.   cetirizine 10 MG tablet Commonly known as:  ZYRTEC Take 10 mg by mouth daily as needed for allergies.   citalopram 20 MG tablet Commonly known as:  CELEXA TAKE 1 TABLET (20 MG TOTAL) BY MOUTH DAILY.   diltiazem 240 MG 24 hr capsule Commonly known as:  CARDIZEM CD TAKE ONE CAPSULE BY MOUTH EVERY DAY What changed:  See the new instructions.   ferrous sulfate 325 (65 FE) MG EC tablet Take 325 mg by mouth 2 (two) times daily.   glipizide-metformin 2.5-250 MG tablet Commonly known as:  METAGLIP TAKE 2 TABLETS BY MOUTH TWICE A DAY BEFORE A MEAL What changed:  See the new  instructions.   losartan-hydrochlorothiazide 100-25 MG tablet Commonly known as:  HYZAAR TAKE 1 TABLET BY MOUTH DAILY.   methocarbamol 500 MG tablet Commonly known as:  ROBAXIN Take 1 tablet (500 mg total) by mouth every 8 (eight) hours as needed for muscle spasms.   nystatin cream Commonly known as:  MYCOSTATIN Apply 1 application topically 2 (two) times daily as needed for dry skin.   omeprazole 20 MG capsule Commonly known as:  PRILOSEC TAKE 2 CAPSULES BY MOUTH EVERY DAY What changed:  See the new instructions.   oxyCODONE 5 MG immediate release tablet Commonly known as:  Oxy IR/ROXICODONE Take 1-2 tablets (5-10 mg total) by mouth every 3 (three) hours as needed for severe pain.   spironolactone 25 MG tablet Commonly known as:  ALDACTONE TAKE 1 TABLET (25 MG TOTAL) BY MOUTH DAILY.       Diagnostic Studies: Dg Chest 2 View  Result Date: 05/22/2016 CLINICAL DATA:  Preop right shoulder surgery. EXAM: CHEST  2 VIEW COMPARISON:  11/29/2015 FINDINGS: Comminuted fracture through the proximal right humerus. Mild inferior subluxation. Heart is normal size.  Lungs are clear.  No effusions. IMPRESSION: Comminuted right proximal humeral fracture with inferior subluxation. No active cardiopulmonary disease. Electronically Signed   By: Rolm Baptise M.D.   On: 05/22/2016 10:12   Dg Shoulder 1v Right  Result Date: 05/29/2016 CLINICAL DATA:  Postoperative pain. Status post shoulder replacement for fracture. EXAM: RIGHT SHOULDER - 1 VIEW COMPARISON:  CT scan of the shoulder of May 19, 2016 FINDINGS: A single AP view reveals a prosthetic shoulder joint. Some fragmentation of the native greater tuberosity is noted. The [facility] joint exhibits mild narrowing. There is an old fracture through the midshaft of the right clavicle. IMPRESSION: Limited visualization of the prosthetic joint components. Fragmentation of the greater tuberosity is present. There is an old displaced mid shaft right clavicular  fracture. Electronically Signed   By: David  Martinique M.D.   On: 05/29/2016 08:07   Ct Shoulder Right Wo Contrast  Result Date: 05/19/2016 CLINICAL DATA:  Evaluate humeral head/ neck fracture from fall 04/27/2016. EXAM: CT OF THE RIGHT SHOULDER WITHOUT CONTRAST TECHNIQUE: Multidetector CT imaging was performed according to the standard protocol. Multiplanar CT image reconstructions were also generated. COMPARISON:  CT scan 04/27/2016 FINDINGS: Stable CT appearance of the complex comminuted humeral head and neck fracture. There is a vertical fracture through the greater tuberosity with maximum of 8.5 mm of displacement. Suspect early healing changes. Transverse fracture through the humeral neck with mild impaction and comminution. Vertical split type fracture involving the anterior aspect of the humeral head also involving the lesser tuberosity. Maximum displacement is 8 mm. The [facility] joint is intact. Type  2-3 acromion with undersurface spurring. The glenoid is intact. IMPRESSION: Stable appearing multipart humeral head/ neck fracture as discussed above. Early healing changes. Electronically Signed   By: Marijo Sanes M.D.   On: 05/19/2016 10:55    Disposition: 01-Home or Self Care  Discharge Instructions    Call MD / Call 911    Complete by:  As directed   If you experience chest pain or shortness of breath, CALL 911 and be transported to the hospital emergency room.  If you develope a fever above 101 F, pus (white drainage) or increased drainage or redness at the wound, or calf pain, call your surgeon's office.   Call MD / Call 911    Complete by:  As directed   If you experience chest pain or shortness of breath, CALL 911 and be transported to the hospital emergency room.  If you develope a fever above 101 F, pus (white drainage) or increased drainage or redness at the wound, or calf pain, call your surgeon's office.   Constipation Prevention    Complete by:  As directed   Drink plenty of fluids.  Prune  juice may be helpful.  You may use a stool softener, such as Colace (over the counter) 100 mg twice a day.  Use MiraLax (over the counter) for constipation as needed.   Constipation Prevention    Complete by:  As directed   Drink plenty of fluids.  Prune juice may be helpful.  You may use a stool softener, such as Colace (over the counter) 100 mg twice a day.  Use MiraLax (over the counter) for constipation as needed.   Diet - low sodium heart healthy    Complete by:  As directed   Diet - low sodium heart healthy    Complete by:  As directed   Discharge instructions    Complete by:  As directed   Okay for pendulum exercises 30 reps counterclockwise once a day. Okay to shower with dressing in place.  Dressing is waterproof No lifting with right arm   Discharge patient    Complete by:  As directed   Increase activity slowly as tolerated    Complete by:  As directed   Increase activity slowly as tolerated    Complete by:  As directed         Signed: Mcarthur Rossetti 05/30/2016, 8:00 AM

## 2016-05-30 NOTE — Discharge Instructions (Signed)
Keep your right shoulder dressing clean. You can get your dressing wet daily in the shower. Leave your dressing on until your orthopedic outpatient follow-up.

## 2016-05-30 NOTE — Progress Notes (Signed)
Attempted to call CM and leave a voice message to inform case management that the order is ready.

## 2016-05-30 NOTE — Progress Notes (Signed)
Subjective: 2 Days Post-Op Procedure(s) (LRB): REVERSE SHOULDER ARTHROPLASTY (Right) Patient reports pain as moderate.  Concerned about meds for her chronic loose stools given her colon cancer history. On Prevalite BID at home.  Objective: Vital signs in last 24 hours: Temp:  [98.3 F (36.8 C)-99.3 F (37.4 C)] 98.9 F (37.2 C) (09/02 0614) Pulse Rate:  [60-79] 60 (09/02 0614) Resp:  [16] 16 (09/02 0614) BP: (89-148)/(34-52) 98/35 (09/02 0614) SpO2:  [88 %-98 %] 96 % (09/02 0614)  Intake/Output from previous day: 09/01 0701 - 09/02 0700 In: 360 [P.O.:360] Out: 1 [Stool:1] Intake/Output this shift: No intake/output data recorded.  No results for input(s): HGB in the last 72 hours. No results for input(s): WBC, RBC, HCT, PLT in the last 72 hours. No results for input(s): NA, K, CL, CO2, BUN, CREATININE, GLUCOSE, CALCIUM in the last 72 hours. No results for input(s): LABPT, INR in the last 72 hours.  Sensation intact distally Intact pulses distally Incision: dressing C/D/I  Assessment/Plan: 2 Days Post-Op Procedure(s) (LRB): REVERSE SHOULDER ARTHROPLASTY (Right) Up with therapy Discharge home with home health today Order Prevalite   Mcarthur Rossetti 05/30/2016, 7:56 AM

## 2016-05-30 NOTE — Progress Notes (Signed)
Initial Nutrition Assessment  DOCUMENTATION CODES:   Not applicable  INTERVENTION:  Continue Boost Breeze po TID, each supplement provides 250 kcal and 9 grams of protein.  Continue snacks TID between meals. Recommended choosing high protein and calorie snacks in setting of weight loss and increased needs post-operatively.   NUTRITION DIAGNOSIS:   Increased nutrient needs (protein, calories) related to wound healing as evidenced by estimated needs.  GOAL:   Patient will meet greater than or equal to 90% of their needs  MONITOR:   PO intake, Supplement acceptance, Labs, Weight trends  REASON FOR ASSESSMENT:   Malnutrition Screening Tool    ASSESSMENT:   75 y.o. Female with medical history significant for Hyperlipidemia, HTN, Adenocarcinoma of the colon, IBS, DM type 2, presented with right shoulder pain after fall several weeks ago. Found to have four-part right proximal humerus fracture. Underwent reverse shoulder arthroplasty on 05/28/2016.   Plan is for d/c home with home health today.  Patient reports she noticed gradual unintentional weight loss over 6 months to 1 year. Her UBW was 182 lbs. Per chart, patient has lost 4 kg (5% body weight) over approximately 7 months (since 10/29/2015). Even when looking back further (up to 1 year), could not find the reported UBW of 182 lbs as weights in chart were around 160 lbs. She did not notice any changes in appetite, but lost her sense of smell so food did not taste the same and was not as appealing. She was eating about 75% of usual intake. She has been drinking Boost Breeze TID since admission and would like to keep drinking them after d/c.  Meal Completion: 75%  Medications reviewed and include: calcium w/ vitamin D (500 mg/200 mg) BID, colace, ferrous sulfate 325 mg BID, metformin, pantoprazole.  Labs reviewed: CBG 114-136 since admission, HgbA1c 5.4 on 04/21/2016.  Nutrition-Focused physical exam completed. Findings are no fat  depletion, mild muscle depletion (temple region), and mild edema. Patient has had no changes in ability to ambulate and complete daily activities of living.   Unable to diagnose malnutrition as weight loss was not severe enough and no overt signs of malnutrition found on NFPE.  Reviewed post-discharge diet with patient and discussed elevated needs post-operatively. Patient amenable to drinking Boost Breeze TID and continuing snacks between meals for at least 1 month post d/c.   Diet Order:  Diet - low sodium heart healthy Diet Carb Modified Fluid consistency: Thin; Room service appropriate? Yes Diet - low sodium heart healthy  Skin:  Wound (see comment) (closed incisions right shoulder and back)  Last BM:  05/28/2016  Height:   Ht Readings from Last 1 Encounters:  05/28/16 5' 5.5" (1.664 m)    Weight:   Wt Readings from Last 1 Encounters:  05/28/16 154 lb (69.9 kg)    Ideal Body Weight:  57.95 kg  BMI:  Body mass index is 25.24 kg/m.  Estimated Nutritional Needs:   Kcal:  1500-1700  Protein:  85-100 grams  Fluid:  >/= 1.5 L/day  EDUCATION NEEDS:   Education needs addressed (Increased protein and calorie needs. Post-discharge diet.)  Willey Blade, MS, RD, LDN

## 2016-06-01 DIAGNOSIS — Z96611 Presence of right artificial shoulder joint: Secondary | ICD-10-CM | POA: Diagnosis not present

## 2016-06-01 DIAGNOSIS — Z7984 Long term (current) use of oral hypoglycemic drugs: Secondary | ICD-10-CM | POA: Diagnosis not present

## 2016-06-01 DIAGNOSIS — F329 Major depressive disorder, single episode, unspecified: Secondary | ICD-10-CM | POA: Diagnosis not present

## 2016-06-01 DIAGNOSIS — Z9181 History of falling: Secondary | ICD-10-CM | POA: Diagnosis not present

## 2016-06-01 DIAGNOSIS — Z955 Presence of coronary angioplasty implant and graft: Secondary | ICD-10-CM | POA: Diagnosis not present

## 2016-06-01 DIAGNOSIS — M1991 Primary osteoarthritis, unspecified site: Secondary | ICD-10-CM | POA: Diagnosis not present

## 2016-06-01 DIAGNOSIS — I10 Essential (primary) hypertension: Secondary | ICD-10-CM | POA: Diagnosis not present

## 2016-06-01 DIAGNOSIS — Z85038 Personal history of other malignant neoplasm of large intestine: Secondary | ICD-10-CM | POA: Diagnosis not present

## 2016-06-01 DIAGNOSIS — Z96653 Presence of artificial knee joint, bilateral: Secondary | ICD-10-CM | POA: Diagnosis not present

## 2016-06-01 DIAGNOSIS — E119 Type 2 diabetes mellitus without complications: Secondary | ICD-10-CM | POA: Diagnosis not present

## 2016-06-01 DIAGNOSIS — S42241D 4-part fracture of surgical neck of right humerus, subsequent encounter for fracture with routine healing: Secondary | ICD-10-CM | POA: Diagnosis not present

## 2016-06-01 DIAGNOSIS — D509 Iron deficiency anemia, unspecified: Secondary | ICD-10-CM | POA: Diagnosis not present

## 2016-06-01 DIAGNOSIS — F419 Anxiety disorder, unspecified: Secondary | ICD-10-CM | POA: Diagnosis not present

## 2016-06-01 DIAGNOSIS — Z7982 Long term (current) use of aspirin: Secondary | ICD-10-CM | POA: Diagnosis not present

## 2016-06-01 NOTE — Op Note (Signed)
NAME:  Tonya [name] NO.:  [id number]  MEDICAL RECORD NO.:  [id number]  LOCATION:                                 FACILITY:  PHYSICIAN:  Anderson Malta, M.D.    DATE OF BIRTH:  1941-01-09  DATE OF PROCEDURE: DATE OF DISCHARGE:                              OPERATIVE REPORT   PREOPERATIVE DIAGNOSIS:  Right proximal humerus fracture, __________ head splitting component.  POSTOPERATIVE DIAGNOSIS:  Right proximal humerus fracture, __________ head splitting component.  PROCEDURE:  Right proximal humerus fracture reverse shoulder replacement using Arthrex components, 5 stem, +12 liner, 4 mL offset small Glenosphere.  SURGEONS:  Anderson Malta, M.D.  ASSISTANT:  April Carlota Raspberry, RNFA.  INDICATIONS:  Tonya [name] is a 75 year old female with right proximal humerus fracture for 4 weeks out, presented for reverse shoulder replacement after explanation of risks and benefits.  PROCEDURE IN DETAIL:  The patient was brought to the operating room where general endotracheal anesthesia was induced.  Preoperative antibiotics were given.  Time-out was called.  The patient was placed in the beach-chair position with a head in neutral position.  Right shoulder, arm, and hand prescrubbed with alcohol, Betadine, allowed to air dry, prepped with DuraPrep solution and draped in sterile manner. Charlie Pitter was used to cover the entire operative field.  Time-out was called.  Incision was made.  Deltopectoral approach just lateral to the coracoid process extending down to the axillary crease. Skin and subcutaneous tissues were sharply divided.  Cephalic vein was mobilized medially.  Deltopectoral interval was entered.  Adhesions were cleared from the subdeltoid space.  Fracture deformity was present in the proximal humeral head.  The biceps tendon was identified and tenodesed to the __________, up for 8 mm, which was released.  The anterior humeral circumflex vessels were identified and  ligated.  She rotator interval was opened up to the base of the coracoid.  Axillary nerve was then visualized and tagged with a vessel loop and protected at all times during the case.  At this time, the lesser tuberosity was osteotomized. The greater tuberosity was osteotomized.  The head was then osteotomized at the metaphyseal neck region.  This was done approximately 30 degrees of retroversion.  Bone graft was then utilized from this head portion. The humerus was progressively external rotator and the capsule was released about 2 cm off the inferior metaphyseal neck with the axillary nerve protected.  This was done to about the 5 o'clock position. Circumferentially, the capsule was also then subsequently released after the capsule was released.  The glenoid was then prepared. Circumferentially, the labrum was removed.  The guide which was up in the location of the __________ pin was determined with preoperative templating off the computer-guided __________ template.  __________ was utilized.  Skin was then drilled into the central portion of the glenoid, and the glenoid was prepared.  The Glenosphere was then placed with excellent __________ obtained both the central screw and the two peripheral screws.  The Glenosphere was placed and was in good position. At this time, attention was then redirected towards the proximal humerus.  It was then broached up to 5. __________ 5-1/2 was attempted to  broach, but we cannot get it down more than __________ 4 mm.  With the true prosthesis in position, there was __________ press-fit and __________cementation was performed with a cement spacer.  Vancomycin was added to the cement.  This was done at about 30 degrees of retroversion.  At this time, trial reduction was performed after cement hardening had occurred with a +3, 6, 9, and 12.  The +12 gave excellent stability, about 2-3 mm of lateral shuck with __________ was difficult to dislocate and  relocate.  This is the one that was chosen and was tapped into position.  The lesser and greater tuberosity fragments were then secured back to the proximal humerus using a FiberWire tape.  Bone graft was applied.  Same stability parameters were maintained.  Thorough irrigation was performed.  Rotator interval closed.  Vancomycin powder was then placed within the incision.  Deltopectoral split was then closed using #1 Vicryl suture.  Skin closed using 0 Vicryl suture, 2-0 Vicryl suture, and 3-0 Monocryl.  Mepilex dressing was placed.  The patient tolerated the procedure well without immediate complication, transferred to recovery room in stable condition in a sling.     Anderson Malta, M.D.   ______________________________ Darnell Level. Alphonzo Severance, M.D.    GSD/MEDQ  D:  05/28/2016  T:  05/29/2016  Job:  [id number]

## 2016-06-02 DIAGNOSIS — D509 Iron deficiency anemia, unspecified: Secondary | ICD-10-CM | POA: Diagnosis not present

## 2016-06-02 DIAGNOSIS — F329 Major depressive disorder, single episode, unspecified: Secondary | ICD-10-CM | POA: Diagnosis not present

## 2016-06-02 DIAGNOSIS — S42241D 4-part fracture of surgical neck of right humerus, subsequent encounter for fracture with routine healing: Secondary | ICD-10-CM | POA: Diagnosis not present

## 2016-06-02 DIAGNOSIS — I10 Essential (primary) hypertension: Secondary | ICD-10-CM | POA: Diagnosis not present

## 2016-06-02 DIAGNOSIS — E119 Type 2 diabetes mellitus without complications: Secondary | ICD-10-CM | POA: Diagnosis not present

## 2016-06-02 DIAGNOSIS — M1991 Primary osteoarthritis, unspecified site: Secondary | ICD-10-CM | POA: Diagnosis not present

## 2016-06-03 ENCOUNTER — Encounter (HOSPITAL_COMMUNITY): Payer: Self-pay | Admitting: Orthopedic Surgery

## 2016-06-03 DIAGNOSIS — S42291D Other displaced fracture of upper end of right humerus, subsequent encounter for fracture with routine healing: Secondary | ICD-10-CM | POA: Diagnosis not present

## 2016-06-04 DIAGNOSIS — S42241D 4-part fracture of surgical neck of right humerus, subsequent encounter for fracture with routine healing: Secondary | ICD-10-CM | POA: Diagnosis not present

## 2016-06-04 DIAGNOSIS — F329 Major depressive disorder, single episode, unspecified: Secondary | ICD-10-CM | POA: Diagnosis not present

## 2016-06-04 DIAGNOSIS — I10 Essential (primary) hypertension: Secondary | ICD-10-CM | POA: Diagnosis not present

## 2016-06-04 DIAGNOSIS — D509 Iron deficiency anemia, unspecified: Secondary | ICD-10-CM | POA: Diagnosis not present

## 2016-06-04 DIAGNOSIS — M1991 Primary osteoarthritis, unspecified site: Secondary | ICD-10-CM | POA: Diagnosis not present

## 2016-06-04 DIAGNOSIS — E119 Type 2 diabetes mellitus without complications: Secondary | ICD-10-CM | POA: Diagnosis not present

## 2016-06-06 DIAGNOSIS — Z23 Encounter for immunization: Secondary | ICD-10-CM | POA: Diagnosis not present

## 2016-06-08 DIAGNOSIS — E119 Type 2 diabetes mellitus without complications: Secondary | ICD-10-CM | POA: Diagnosis not present

## 2016-06-08 DIAGNOSIS — S42241D 4-part fracture of surgical neck of right humerus, subsequent encounter for fracture with routine healing: Secondary | ICD-10-CM | POA: Diagnosis not present

## 2016-06-08 DIAGNOSIS — F329 Major depressive disorder, single episode, unspecified: Secondary | ICD-10-CM | POA: Diagnosis not present

## 2016-06-08 DIAGNOSIS — I10 Essential (primary) hypertension: Secondary | ICD-10-CM | POA: Diagnosis not present

## 2016-06-08 DIAGNOSIS — M1991 Primary osteoarthritis, unspecified site: Secondary | ICD-10-CM | POA: Diagnosis not present

## 2016-06-08 DIAGNOSIS — D509 Iron deficiency anemia, unspecified: Secondary | ICD-10-CM | POA: Diagnosis not present

## 2016-06-09 DIAGNOSIS — I10 Essential (primary) hypertension: Secondary | ICD-10-CM | POA: Diagnosis not present

## 2016-06-09 DIAGNOSIS — D509 Iron deficiency anemia, unspecified: Secondary | ICD-10-CM | POA: Diagnosis not present

## 2016-06-09 DIAGNOSIS — M1991 Primary osteoarthritis, unspecified site: Secondary | ICD-10-CM | POA: Diagnosis not present

## 2016-06-09 DIAGNOSIS — E119 Type 2 diabetes mellitus without complications: Secondary | ICD-10-CM | POA: Diagnosis not present

## 2016-06-09 DIAGNOSIS — F329 Major depressive disorder, single episode, unspecified: Secondary | ICD-10-CM | POA: Diagnosis not present

## 2016-06-09 DIAGNOSIS — S42241D 4-part fracture of surgical neck of right humerus, subsequent encounter for fracture with routine healing: Secondary | ICD-10-CM | POA: Diagnosis not present

## 2016-06-11 ENCOUNTER — Ambulatory Visit: Payer: Medicare Other | Admitting: Cardiology

## 2016-06-12 DIAGNOSIS — M1991 Primary osteoarthritis, unspecified site: Secondary | ICD-10-CM | POA: Diagnosis not present

## 2016-06-12 DIAGNOSIS — I10 Essential (primary) hypertension: Secondary | ICD-10-CM | POA: Diagnosis not present

## 2016-06-12 DIAGNOSIS — D509 Iron deficiency anemia, unspecified: Secondary | ICD-10-CM | POA: Diagnosis not present

## 2016-06-12 DIAGNOSIS — E119 Type 2 diabetes mellitus without complications: Secondary | ICD-10-CM | POA: Diagnosis not present

## 2016-06-12 DIAGNOSIS — F329 Major depressive disorder, single episode, unspecified: Secondary | ICD-10-CM | POA: Diagnosis not present

## 2016-06-12 DIAGNOSIS — S42241D 4-part fracture of surgical neck of right humerus, subsequent encounter for fracture with routine healing: Secondary | ICD-10-CM | POA: Diagnosis not present

## 2016-06-14 ENCOUNTER — Other Ambulatory Visit: Payer: Self-pay | Admitting: Family Medicine

## 2016-06-15 DIAGNOSIS — E119 Type 2 diabetes mellitus without complications: Secondary | ICD-10-CM | POA: Diagnosis not present

## 2016-06-15 DIAGNOSIS — M1991 Primary osteoarthritis, unspecified site: Secondary | ICD-10-CM | POA: Diagnosis not present

## 2016-06-15 DIAGNOSIS — D509 Iron deficiency anemia, unspecified: Secondary | ICD-10-CM | POA: Diagnosis not present

## 2016-06-15 DIAGNOSIS — S42241D 4-part fracture of surgical neck of right humerus, subsequent encounter for fracture with routine healing: Secondary | ICD-10-CM | POA: Diagnosis not present

## 2016-06-15 DIAGNOSIS — I10 Essential (primary) hypertension: Secondary | ICD-10-CM | POA: Diagnosis not present

## 2016-06-15 DIAGNOSIS — F329 Major depressive disorder, single episode, unspecified: Secondary | ICD-10-CM | POA: Diagnosis not present

## 2016-06-16 ENCOUNTER — Encounter: Payer: Medicare Other | Admitting: Gastroenterology

## 2016-06-16 DIAGNOSIS — F329 Major depressive disorder, single episode, unspecified: Secondary | ICD-10-CM | POA: Diagnosis not present

## 2016-06-16 DIAGNOSIS — M1991 Primary osteoarthritis, unspecified site: Secondary | ICD-10-CM | POA: Diagnosis not present

## 2016-06-16 DIAGNOSIS — D509 Iron deficiency anemia, unspecified: Secondary | ICD-10-CM | POA: Diagnosis not present

## 2016-06-16 DIAGNOSIS — S42241D 4-part fracture of surgical neck of right humerus, subsequent encounter for fracture with routine healing: Secondary | ICD-10-CM | POA: Diagnosis not present

## 2016-06-16 DIAGNOSIS — E119 Type 2 diabetes mellitus without complications: Secondary | ICD-10-CM | POA: Diagnosis not present

## 2016-06-16 DIAGNOSIS — I10 Essential (primary) hypertension: Secondary | ICD-10-CM | POA: Diagnosis not present

## 2016-06-19 DIAGNOSIS — S42241D 4-part fracture of surgical neck of right humerus, subsequent encounter for fracture with routine healing: Secondary | ICD-10-CM | POA: Diagnosis not present

## 2016-06-19 DIAGNOSIS — D509 Iron deficiency anemia, unspecified: Secondary | ICD-10-CM | POA: Diagnosis not present

## 2016-06-19 DIAGNOSIS — M1991 Primary osteoarthritis, unspecified site: Secondary | ICD-10-CM | POA: Diagnosis not present

## 2016-06-19 DIAGNOSIS — I10 Essential (primary) hypertension: Secondary | ICD-10-CM | POA: Diagnosis not present

## 2016-06-19 DIAGNOSIS — E119 Type 2 diabetes mellitus without complications: Secondary | ICD-10-CM | POA: Diagnosis not present

## 2016-06-19 DIAGNOSIS — F329 Major depressive disorder, single episode, unspecified: Secondary | ICD-10-CM | POA: Diagnosis not present

## 2016-06-23 DIAGNOSIS — S42241D 4-part fracture of surgical neck of right humerus, subsequent encounter for fracture with routine healing: Secondary | ICD-10-CM | POA: Diagnosis not present

## 2016-06-23 DIAGNOSIS — D509 Iron deficiency anemia, unspecified: Secondary | ICD-10-CM | POA: Diagnosis not present

## 2016-06-23 DIAGNOSIS — E119 Type 2 diabetes mellitus without complications: Secondary | ICD-10-CM | POA: Diagnosis not present

## 2016-06-23 DIAGNOSIS — M1991 Primary osteoarthritis, unspecified site: Secondary | ICD-10-CM | POA: Diagnosis not present

## 2016-06-23 DIAGNOSIS — F329 Major depressive disorder, single episode, unspecified: Secondary | ICD-10-CM | POA: Diagnosis not present

## 2016-06-23 DIAGNOSIS — I10 Essential (primary) hypertension: Secondary | ICD-10-CM | POA: Diagnosis not present

## 2016-06-26 DIAGNOSIS — D509 Iron deficiency anemia, unspecified: Secondary | ICD-10-CM | POA: Diagnosis not present

## 2016-06-26 DIAGNOSIS — F329 Major depressive disorder, single episode, unspecified: Secondary | ICD-10-CM | POA: Diagnosis not present

## 2016-06-26 DIAGNOSIS — S42241D 4-part fracture of surgical neck of right humerus, subsequent encounter for fracture with routine healing: Secondary | ICD-10-CM | POA: Diagnosis not present

## 2016-06-26 DIAGNOSIS — E119 Type 2 diabetes mellitus without complications: Secondary | ICD-10-CM | POA: Diagnosis not present

## 2016-06-26 DIAGNOSIS — M1991 Primary osteoarthritis, unspecified site: Secondary | ICD-10-CM | POA: Diagnosis not present

## 2016-06-26 DIAGNOSIS — I10 Essential (primary) hypertension: Secondary | ICD-10-CM | POA: Diagnosis not present

## 2016-06-30 DIAGNOSIS — I10 Essential (primary) hypertension: Secondary | ICD-10-CM | POA: Diagnosis not present

## 2016-06-30 DIAGNOSIS — D509 Iron deficiency anemia, unspecified: Secondary | ICD-10-CM | POA: Diagnosis not present

## 2016-06-30 DIAGNOSIS — E119 Type 2 diabetes mellitus without complications: Secondary | ICD-10-CM | POA: Diagnosis not present

## 2016-06-30 DIAGNOSIS — M1991 Primary osteoarthritis, unspecified site: Secondary | ICD-10-CM | POA: Diagnosis not present

## 2016-06-30 DIAGNOSIS — F329 Major depressive disorder, single episode, unspecified: Secondary | ICD-10-CM | POA: Diagnosis not present

## 2016-06-30 DIAGNOSIS — S42241D 4-part fracture of surgical neck of right humerus, subsequent encounter for fracture with routine healing: Secondary | ICD-10-CM | POA: Diagnosis not present

## 2016-07-01 ENCOUNTER — Ambulatory Visit (INDEPENDENT_AMBULATORY_CARE_PROVIDER_SITE_OTHER): Payer: Self-pay | Admitting: Orthopedic Surgery

## 2016-07-02 ENCOUNTER — Ambulatory Visit (INDEPENDENT_AMBULATORY_CARE_PROVIDER_SITE_OTHER): Payer: Medicare Other | Admitting: Orthopedic Surgery

## 2016-07-02 DIAGNOSIS — S42291D Other displaced fracture of upper end of right humerus, subsequent encounter for fracture with routine healing: Secondary | ICD-10-CM

## 2016-07-03 DIAGNOSIS — I10 Essential (primary) hypertension: Secondary | ICD-10-CM | POA: Diagnosis not present

## 2016-07-03 DIAGNOSIS — F329 Major depressive disorder, single episode, unspecified: Secondary | ICD-10-CM | POA: Diagnosis not present

## 2016-07-03 DIAGNOSIS — D509 Iron deficiency anemia, unspecified: Secondary | ICD-10-CM | POA: Diagnosis not present

## 2016-07-03 DIAGNOSIS — E119 Type 2 diabetes mellitus without complications: Secondary | ICD-10-CM | POA: Diagnosis not present

## 2016-07-03 DIAGNOSIS — M1991 Primary osteoarthritis, unspecified site: Secondary | ICD-10-CM | POA: Diagnosis not present

## 2016-07-03 DIAGNOSIS — S42241D 4-part fracture of surgical neck of right humerus, subsequent encounter for fracture with routine healing: Secondary | ICD-10-CM | POA: Diagnosis not present

## 2016-07-06 DIAGNOSIS — E119 Type 2 diabetes mellitus without complications: Secondary | ICD-10-CM | POA: Diagnosis not present

## 2016-07-06 DIAGNOSIS — M1991 Primary osteoarthritis, unspecified site: Secondary | ICD-10-CM | POA: Diagnosis not present

## 2016-07-06 DIAGNOSIS — F329 Major depressive disorder, single episode, unspecified: Secondary | ICD-10-CM | POA: Diagnosis not present

## 2016-07-06 DIAGNOSIS — D509 Iron deficiency anemia, unspecified: Secondary | ICD-10-CM | POA: Diagnosis not present

## 2016-07-06 DIAGNOSIS — I10 Essential (primary) hypertension: Secondary | ICD-10-CM | POA: Diagnosis not present

## 2016-07-06 DIAGNOSIS — S42241D 4-part fracture of surgical neck of right humerus, subsequent encounter for fracture with routine healing: Secondary | ICD-10-CM | POA: Diagnosis not present

## 2016-07-09 ENCOUNTER — Telehealth: Payer: Self-pay | Admitting: Family Medicine

## 2016-07-09 DIAGNOSIS — S42241D 4-part fracture of surgical neck of right humerus, subsequent encounter for fracture with routine healing: Secondary | ICD-10-CM | POA: Diagnosis not present

## 2016-07-09 DIAGNOSIS — D509 Iron deficiency anemia, unspecified: Secondary | ICD-10-CM | POA: Diagnosis not present

## 2016-07-09 DIAGNOSIS — F329 Major depressive disorder, single episode, unspecified: Secondary | ICD-10-CM | POA: Diagnosis not present

## 2016-07-09 DIAGNOSIS — I10 Essential (primary) hypertension: Secondary | ICD-10-CM | POA: Diagnosis not present

## 2016-07-09 DIAGNOSIS — E119 Type 2 diabetes mellitus without complications: Secondary | ICD-10-CM | POA: Diagnosis not present

## 2016-07-09 DIAGNOSIS — M1991 Primary osteoarthritis, unspecified site: Secondary | ICD-10-CM | POA: Diagnosis not present

## 2016-07-09 NOTE — Telephone Encounter (Signed)
Tonya [name] called to report that during her visit today the pt had a resting HR of 40 and 45 with excursion.  She said this is lower that at prior visits.  She wanted to let you know.

## 2016-07-10 ENCOUNTER — Encounter (HOSPITAL_COMMUNITY): Payer: Self-pay | Admitting: Orthopedic Surgery

## 2016-07-10 NOTE — Telephone Encounter (Signed)
Spoke to [place] with [facility].  She has since discharged this patient but does state patient is not taking any Narcotics post op.  She ask that we contact patient directly.  I left message for Ms. Denk to contact her Cardiologist to see if he may want to decrease her blood pressure medicine that is lowering her pulse per Dr. Diona Browner.

## 2016-07-10 NOTE — Telephone Encounter (Signed)
May be low if taking narcotic pain med post op.. Decrease. Otherwise she needs to call her cardiologist to lower BP meds that lower pulse.

## 2016-07-14 DIAGNOSIS — Z96611 Presence of right artificial shoulder joint: Secondary | ICD-10-CM | POA: Diagnosis not present

## 2016-07-14 DIAGNOSIS — M25511 Pain in right shoulder: Secondary | ICD-10-CM | POA: Diagnosis not present

## 2016-07-16 ENCOUNTER — Other Ambulatory Visit: Payer: Self-pay | Admitting: Gastroenterology

## 2016-07-20 ENCOUNTER — Telehealth (INDEPENDENT_AMBULATORY_CARE_PROVIDER_SITE_OTHER): Payer: Self-pay | Admitting: *Deleted

## 2016-07-20 MED ORDER — CLINDAMYCIN HCL 300 MG PO CAPS: ORAL_CAPSULE | ORAL | 0 refills | Status: DC

## 2016-07-20 NOTE — Telephone Encounter (Signed)
Pt called stating she is having dental work done next Monday and wants to know if can get a prescription for amoxicillan sent  To [facility] [address] Lorina Rabon [place] call back [phone]

## 2016-07-20 NOTE — Telephone Encounter (Signed)
Clindamycin sent to pharrmacy for her dental procedure.

## 2016-07-21 DIAGNOSIS — Z96611 Presence of right artificial shoulder joint: Secondary | ICD-10-CM | POA: Diagnosis not present

## 2016-07-21 DIAGNOSIS — M25511 Pain in right shoulder: Secondary | ICD-10-CM | POA: Diagnosis not present

## 2016-07-23 ENCOUNTER — Telehealth: Payer: Self-pay | Admitting: Family Medicine

## 2016-07-23 DIAGNOSIS — E119 Type 2 diabetes mellitus without complications: Secondary | ICD-10-CM

## 2016-07-23 DIAGNOSIS — E782 Mixed hyperlipidemia: Secondary | ICD-10-CM

## 2016-07-23 NOTE — Telephone Encounter (Signed)
-----   Message from Marchia Bond sent at 07/17/2016  3:32 PM EDT ----- Regarding: 3 mo f/u labs Fri 10/27, need orders. Thanks! :-) Please order future 3 mo f/u labs for pt's upcoming lab appt. Thanks Aniceto Boss

## 2016-07-27 ENCOUNTER — Other Ambulatory Visit (INDEPENDENT_AMBULATORY_CARE_PROVIDER_SITE_OTHER): Payer: Medicare Other

## 2016-07-27 DIAGNOSIS — E119 Type 2 diabetes mellitus without complications: Secondary | ICD-10-CM

## 2016-07-27 DIAGNOSIS — E782 Mixed hyperlipidemia: Secondary | ICD-10-CM | POA: Diagnosis not present

## 2016-07-27 LAB — LIPID PANEL
CHOL/HDL RATIO: 2
CHOLESTEROL: 94 mg/dL (ref 0–200)
HDL: 41.2 mg/dL (ref >39.00)
LDL CALC: 29 mg/dL (ref 0–99)
NonHDL: 52.48
TRIGLYCERIDES: 118 mg/dL (ref 0.0–149.0)
VLDL: 23.6 mg/dL (ref 0.0–40.0)

## 2016-07-27 LAB — COMPREHENSIVE METABOLIC PANEL
ALBUMIN: 4.2 g/dL (ref 3.5–5.2)
ALT: 13 U/L (ref 0–35)
AST: 18 U/L (ref 0–37)
Alkaline Phosphatase: 69 U/L (ref 39–117)
BUN: 31 mg/dL — ABNORMAL HIGH (ref 6–23)
CALCIUM: 9.4 mg/dL (ref 8.4–10.5)
CHLORIDE: 110 meq/L (ref 96–112)
CO2: 22 meq/L (ref 19–32)
CREATININE: 1.5 mg/dL — AB (ref 0.40–1.20)
GFR: 35.85 mL/min — AB (ref >60.00)
Glucose, Bld: 96 mg/dL (ref 70–99)
POTASSIUM: 5 meq/L (ref 3.5–5.1)
Sodium: 140 mEq/L (ref 135–145)
Total Bilirubin: 0.3 mg/dL (ref 0.2–1.2)
Total Protein: 7.1 g/dL (ref 6.0–8.3)

## 2016-07-27 LAB — HEMOGLOBIN A1C: Hgb A1c MFr Bld: 5.3 % (ref 4.6–6.5)

## 2016-07-28 DIAGNOSIS — Z96611 Presence of right artificial shoulder joint: Secondary | ICD-10-CM | POA: Diagnosis not present

## 2016-07-28 DIAGNOSIS — M25511 Pain in right shoulder: Secondary | ICD-10-CM | POA: Diagnosis not present

## 2016-07-30 ENCOUNTER — Ambulatory Visit (INDEPENDENT_AMBULATORY_CARE_PROVIDER_SITE_OTHER): Payer: Medicare Other | Admitting: Family Medicine

## 2016-07-30 ENCOUNTER — Encounter: Payer: Self-pay | Admitting: Family Medicine

## 2016-07-30 DIAGNOSIS — E1122 Type 2 diabetes mellitus with diabetic chronic kidney disease: Secondary | ICD-10-CM

## 2016-07-30 DIAGNOSIS — I10 Essential (primary) hypertension: Secondary | ICD-10-CM

## 2016-07-30 DIAGNOSIS — E0821 Diabetes mellitus due to underlying condition with diabetic nephropathy: Secondary | ICD-10-CM

## 2016-07-30 DIAGNOSIS — I2510 Atherosclerotic heart disease of native coronary artery without angina pectoris: Secondary | ICD-10-CM | POA: Diagnosis not present

## 2016-07-30 DIAGNOSIS — E782 Mixed hyperlipidemia: Secondary | ICD-10-CM | POA: Diagnosis not present

## 2016-07-30 DIAGNOSIS — N183 Chronic kidney disease, stage 3 (moderate): Secondary | ICD-10-CM

## 2016-07-30 LAB — HM DIABETES FOOT EXAM

## 2016-07-30 NOTE — Assessment & Plan Note (Addendum)
Well controlled. Continue current medication. Encouraged exercise, weight  maintanance, healthy eating habits.

## 2016-07-30 NOTE — Assessment & Plan Note (Addendum)
Stable. Secondary to DM and HTN. Avoiding NSAIDs and nephrotoxic meds . On low dose metformin.  On ARB.

## 2016-07-30 NOTE — Assessment & Plan Note (Signed)
Good control even with decrease in metaglip to 1 a day. Pt hesistant about having med decreased further.  NO current low.

## 2016-07-30 NOTE — Assessment & Plan Note (Signed)
Well controlled. Continue current medication.  

## 2016-07-30 NOTE — Progress Notes (Signed)
Pre visit review using our clinic review tool, if applicable. No additional management support is needed unless otherwise documented below in the visit note. 

## 2016-07-30 NOTE — Patient Instructions (Addendum)
Start probiotic (culturelle, align) while on clindamycin to prevent GI side effects.  Stay on lower dose metaglip.. Call if any lows < 60s.

## 2016-07-30 NOTE — Progress Notes (Signed)
75 year old female presents for 3 month DM follow up.    Hypertension: well controlled on current meds. Had lows post op.. No since. BP Readings from Last 3 Encounters:  07/30/16 130/64  05/30/16 (!) 102/38  05/22/16 (!) 126/51  Using medication without problems or lightheadedness: none Chest pain with exertion: None  Edema none.  Short of breath: Mild  Average home BPs: good control  Other issues: CAD, afib: stable per cards, rate controlled.  Diabetes: On metaglip, overtreated with some lows.. So at  Last OV decreased to 1 tab of metaglip. Lab Results  Component Value Date   HGBA1C 5.3 07/27/2016  Hypoglycemic episodes:  90s Hyperglycemic episodes:None  Feet problems:none Blood Sugars averaging: FBS 90-110 Eye exam within last year: 12/2014 bilateral cataract surgery 5 and 02/2015  Elevated Cholesterol: Well controlled on atorvastatin 20 mg LDL at goal < 70! Lab Results  Component Value Date   CHOL 94 07/27/2016   HDL 41.20 07/27/2016   LDLCALC 29 07/27/2016   LDLDIRECT 50.0 10/22/2014   TRIG 118.0 07/27/2016   CHOLHDL 2 07/27/2016  Muscle aches: None  Other complaints:  Moderate diet. Minimal walking   Body mass index is 25.71 kg/m. Wt Readings from Last 3 Encounters:  07/30/16 154 lb 8 oz (70.1 kg)  05/28/16 154 lb (69.9 kg)  05/22/16 154 lb 9.6 oz (70.1 kg)     CKD, stable, on RB, on diuretics. Keep with fluids, avoids NSAIDs.  Review of Systems  Constitutional: Negative for fever and fatigue.  HENT: Negative for ear pain.  Eyes: Negative for pain.  Respiratory: Negative for chest tightness and shortness of breath.  Cardiovascular: Negative for chest pain, palpitations and leg swelling.  Gastrointestinal: Negative for abdominal pain.  Genitourinary: Negative for dysuria. Urinary frequency and urgency, incontinence with cough sneeze more frequent now, 1-2 UOP at night, every 2-3 hours during the day ( not bothering her enough to  treat).  Objective:  Physical Exam Constitutional: Vital signs are normal except bradycardia. She appears well-developed and well-nourished. She is cooperative. Non-toxic appearance. She does not appear ill. No distress.  HENT:  Head: Normocephalic.  Right Ear: Hearing, tympanic membrane, external ear and ear canal normal.  Left Ear: Hearing, tympanic membrane, external ear and ear canal normal.  Nose: Nose normal.  Eyes: Conjunctivae, EOM and lids are normal. Pupils are equal, round, and reactive to light. No foreign bodies found.  Neck: Trachea normal and normal range of motion. Neck supple. Carotid bruit is not present. No mass and no thyromegaly present.  Cardiovascular: Normal rate, regular rhythm, S1 normal, S2 normal, normal heart sounds and intact distal pulses. Exam reveals no gallop.  No murmur heard.  Pulmonary/Chest: Effort normal and breath sounds normal. No respiratory distress. She has no wheezes. She has no rhonchi. She has no rales.  Abdominal: Soft. Normal appearance and bowel sounds are normal. She exhibits no distension, no fluid wave, no abdominal bruit and no mass. There is no hepatosplenomegaly. There is no tenderness. There is no rebound, no guarding and no CVA tenderness. No hernia.   Lymphadenopathy:  She has no cervical adenopathy.  She has no axillary adenopathy.  Neurological: She is alert. She has normal strength. No cranial nerve deficit or sensory deficit.  Skin: Skin is warm, dry and intact. No rash noted.  Psychiatric: Her speech is normal and behavior is normal. Judgment normal. Her mood appears not anxious. Cognition and memory are normal. She does not exhibit a depressed mood.  Diabetic foot exam:  blisters between toes, no sign of infection, keep shoe change. Normal inspection  No skin breakdown  No calluses  Normal DP pulses  Normal sensation to light tough and monofilament  Nails normal

## 2016-08-04 DIAGNOSIS — Z96611 Presence of right artificial shoulder joint: Secondary | ICD-10-CM | POA: Diagnosis not present

## 2016-08-04 DIAGNOSIS — M25511 Pain in right shoulder: Secondary | ICD-10-CM | POA: Diagnosis not present

## 2016-08-05 ENCOUNTER — Ambulatory Visit (INDEPENDENT_AMBULATORY_CARE_PROVIDER_SITE_OTHER): Payer: Medicare Other | Admitting: Orthopedic Surgery

## 2016-08-06 ENCOUNTER — Ambulatory Visit (INDEPENDENT_AMBULATORY_CARE_PROVIDER_SITE_OTHER): Payer: Medicare Other | Admitting: Orthopedic Surgery

## 2016-08-06 ENCOUNTER — Encounter (INDEPENDENT_AMBULATORY_CARE_PROVIDER_SITE_OTHER): Payer: Self-pay | Admitting: Orthopedic Surgery

## 2016-08-06 DIAGNOSIS — Z96611 Presence of right artificial shoulder joint: Secondary | ICD-10-CM

## 2016-08-06 NOTE — Progress Notes (Signed)
Post-Op Visit Note   Patient: Tonya [name]           Date of Birth: 1941/03/19           MRN: [id number] Visit Date: 08/06/2016 PCP: [name], MD   Assessment & Plan:  Chief Complaint:  Chief Complaint  Patient presents with  . Right Shoulder - Routine Post Op   Visit Diagnoses:  1. H/O total shoulder replacement, right     Plan: Jared is a 75 year old patient 10 weeks out reverse shoulder replacement for fracture she's doing physical therapy 1 time a week she has CPM machine she's doing stretching on her own as well on exam she has improved range of motion approaching 90 of forward flexion and 90 of abduction and strength is improved at this time I will let her get rid of the CPM machine continue with physical therapy 3 month return for clinical recheck she's not taking any pain meds currently  Follow-Up Instructions: Return in about 3 months (around 11/06/2016).   Orders:  No orders of the defined types were placed in this encounter.  No orders of the defined types were placed in this encounter.    PMFS History: Patient Active Problem List   Diagnosis Date Noted  . Anemia 11/29/2015  . Spinal stenosis, lumbar region, with neurogenic claudication 11/26/2015    Class: Chronic  . Mixed incontinence urge and stress 10/29/2015  . CKD stage 3 due to type 2 diabetes mellitus ([place]) 06/25/2015  . Hyperkalemia 04/25/2015  . Counseling regarding end of life decision making 10/26/2014  . S/P total knee replacement using cement 04/03/2014  . Intertrigo 04/25/2013  . Urinary urgency 10/18/2012  . ATRIAL FIBRILLATION, PAROXYSMAL 10/17/2010  . ALLERGIC RHINITIS 01/23/2009  . ADENOCARCINOMA, COLON, CECUM 10/05/2008  . Coronary atherosclerosis 11/23/2007  . Hyperlipidemia 09/19/2007  . ANXIETY 09/19/2007  . Depression 09/19/2007  . GERD 09/19/2007  . DM due to underlying condition with diabetic nephropathy ([place]) 03/21/2007  . SYNDROME, CARPAL TUNNEL 03/21/2007  .  Essential hypertension, benign 03/21/2007  . IBS 03/21/2007  . Osteoarthrosis, unspecified whether generalized or localized, unspecified site 03/21/2007   Past Medical History:  Diagnosis Date  . Adenocarcinoma, colon ([place])   . Anemia    iron deficiency  . Anxiety   . Carpal tunnel syndrome   . Chronic diarrhea   . Colon polyps    Tubular adenoma  . Degenerative disk disease   . Depression   . Diabetes mellitus, type 2 ([place])   . Dysrhythmia    h/o AFIB  . Fall at home 03/16/2014   no fx; multiple facial lacerations, abrasions and hematoma  . GERD (gastroesophageal reflux disease)   . Headache   . Hyperlipidemia   . Hypertension   . IBS (irritable bowel syndrome)   . Osteoarthritis   . S/P coronary artery stent placement 2007  . Sciatica     Family History  Problem Relation Age of Onset  . Heart disease Mother     massive MI age 31  . Heart attack Mother   . Heart disease Father     Massive MI age 25  . Prostate cancer Father   . Colon cancer Neg Hx     Past Surgical History:  Procedure Laterality Date  . CARDIAC CATHETERIZATION  12/10   LAD stent patent. Insignificant CAD, otherwise EF 60-65%  . CARPAL TUNNEL RELEASE    . CATARACT EXTRACTION  03/2015  . CHOLECYSTECTOMY    . DILATION  AND CURETTAGE OF UTERUS    . HEMATOMA EVACUATION     after knee replacement  . KNEE ARTHROSCOPY     bilateral  . LUMBAR LAMINECTOMY N/A 11/26/2015   Procedure: LUMBAR DECOMPRESSIVE LAMINECTOMY L3-4 AND L4-5, LEFT L2-3 HEMILAMINECTOMY;  Surgeon: Jessy Oto, MD;  Location: [facility];  Service: Orthopedics;  Laterality: N/A;  . PARTIAL COLECTOMY  11/2008   right, for adenocarcinoma  . REPLACEMENT TOTAL KNEE  03/9037   right, complicated by hemarthrosis   . REVERSE SHOULDER ARTHROPLASTY Right 05/28/2016  . REVERSE SHOULDER ARTHROPLASTY Right 05/28/2016   Procedure: REVERSE SHOULDER ARTHROPLASTY;  Surgeon: Meredith Pel, MD;  Location: [facility];  Service: Orthopedics;  Laterality: Right;   . TONSILLECTOMY    . TOTAL KNEE ARTHROPLASTY Left 04/03/2014   dr whitfield  . TOTAL KNEE ARTHROPLASTY Left 04/03/2014   Procedure: TOTAL KNEE ARTHROPLASTY;  Surgeon: Garald Balding, MD;  Location: [facility];  Service: Orthopedics;  Laterality: Left;  . TOTAL KNEE REVISION  03/08/2012   Procedure: TOTAL KNEE REVISION;  Surgeon: Garald Balding, MD;  Location: [facility];  Service: Orthopedics;  Laterality: Right;  removal total knee hardware and placement of antibiotic cement spacer and antibiotic beads  . TOTAL KNEE REVISION  05/17/2012   Procedure: TOTAL KNEE REVISION;  Surgeon: Garald Balding, MD;  Location: [facility];  Service: Orthopedics;  Laterality: Right;  right total knee revision, removal of antibiotic spacer   Social History   Occupational History  . retired   .  Retired   Social History Main Topics  . Smoking status: Never Smoker  . Smokeless tobacco: Never Used  . Alcohol use No  . Drug use: No  . Sexual activity: No

## 2016-08-11 DIAGNOSIS — Z96611 Presence of right artificial shoulder joint: Secondary | ICD-10-CM | POA: Diagnosis not present

## 2016-08-11 DIAGNOSIS — M25511 Pain in right shoulder: Secondary | ICD-10-CM | POA: Diagnosis not present

## 2016-08-16 ENCOUNTER — Other Ambulatory Visit: Payer: Self-pay | Admitting: Cardiology

## 2016-08-18 DIAGNOSIS — Z96611 Presence of right artificial shoulder joint: Secondary | ICD-10-CM | POA: Diagnosis not present

## 2016-08-18 DIAGNOSIS — M25511 Pain in right shoulder: Secondary | ICD-10-CM | POA: Diagnosis not present

## 2016-09-04 ENCOUNTER — Other Ambulatory Visit: Payer: Self-pay | Admitting: Cardiology

## 2016-09-04 NOTE — Telephone Encounter (Signed)
Rx(s) sent to pharmacy electronically.  

## 2016-09-14 ENCOUNTER — Other Ambulatory Visit: Payer: Self-pay | Admitting: Gastroenterology

## 2016-09-14 ENCOUNTER — Other Ambulatory Visit: Payer: Self-pay | Admitting: Family Medicine

## 2016-09-19 ENCOUNTER — Other Ambulatory Visit: Payer: Self-pay | Admitting: Family Medicine

## 2016-09-22 ENCOUNTER — Other Ambulatory Visit: Payer: Self-pay | Admitting: Family Medicine

## 2016-09-22 DIAGNOSIS — Z1231 Encounter for screening mammogram for malignant neoplasm of breast: Secondary | ICD-10-CM

## 2016-10-13 ENCOUNTER — Other Ambulatory Visit: Payer: Self-pay | Admitting: Family Medicine

## 2016-10-28 ENCOUNTER — Ambulatory Visit
Admission: RE | Admit: 2016-10-28 | Discharge: 2016-10-28 | Disposition: A | Discharge status: Home / Self Care | Payer: Medicare Other | Source: Ambulatory Visit | Attending: Family Medicine | Admitting: Family Medicine

## 2016-10-28 DIAGNOSIS — Z1231 Encounter for screening mammogram for malignant neoplasm of breast: Secondary | ICD-10-CM

## 2016-11-06 ENCOUNTER — Ambulatory Visit (INDEPENDENT_AMBULATORY_CARE_PROVIDER_SITE_OTHER): Payer: Medicare Other | Admitting: Orthopedic Surgery

## 2016-11-06 ENCOUNTER — Encounter (INDEPENDENT_AMBULATORY_CARE_PROVIDER_SITE_OTHER): Payer: Self-pay | Admitting: Orthopedic Surgery

## 2016-11-06 DIAGNOSIS — Z96611 Presence of right artificial shoulder joint: Secondary | ICD-10-CM | POA: Diagnosis not present

## 2016-11-06 NOTE — Progress Notes (Signed)
Office Visit Note   Patient: Tonya [name]           Date of Birth: 1941/02/17           MRN: [id number] Visit Date: 11/06/2016 Requested by: Tonya Sanders, MD [place], [place] [id number] PCP: Tonya Lofts, MD  Subjective: Chief Complaint  Patient presents with  . Right Shoulder - Follow-up    HPI Tonya [name] is a 76 year old patient underwent reverse shoulder replacement 05/28/2016.  She's been doing well.  Has only occasional pain.  She's able do her ADLs without difficulty.  She was able to wash her hair last night.  She is taking no medications currently.  She is able to lift her grandchild is 20 pounds but I cautioned her not to lift more than that.  She can't sleep on that side.              Review of Systems All systems reviewed are negative as they relate to the chief complaint within the history of present illness.  Patient denies  fevers or chills.    Assessment & Plan: Visit Diagnoses:  1. History of total replacement of right shoulder joint     Plan: Impression is patient doing well following right reverse shoulder replacement for fracture.  She is functional and has only occasional pain.  I'm going to release her at this time.  Need for antibiotic prophylaxis discussed.  Follow-up with me as needed  Follow-Up Instructions: No Follow-up on file.   Orders:  No orders of the defined types were placed in this encounter.  No orders of the defined types were placed in this encounter.     Procedures: No procedures performed   Clinical Data: No additional findings.  Objective: Vital Signs: There were no vitals taken for this visit.  Physical Exam   Constitutional: Patient appears well-developed HEENT:  Head: Normocephalic Eyes:EOM are normal Neck: Normal range of motion Cardiovascular: Normal rate Pulmonary/chest: Effort normal Neurologic: Patient is alert Skin: Skin is warm Psychiatric: Patient has normal mood and affect    Ortho  Exam orthopedic exam demonstrates good active and passive range of motion of the shoulder above 90.  No coarseness with internal/external rotation.  Deltoid fires.  Nicely.  She has good strength.  Specialty Comments:  No specialty comments available.  Imaging: No results found.   PMFS History: Patient Active Problem List   Diagnosis Date Noted  . History of total replacement of right shoulder joint 11/06/2016  . Anemia 11/29/2015  . Spinal stenosis, lumbar region, with neurogenic claudication 11/26/2015    Class: Chronic  . Mixed incontinence urge and stress 10/29/2015  . CKD stage 3 due to type 2 diabetes mellitus ([facility]) 06/25/2015  . Hyperkalemia 04/25/2015  . Counseling regarding end of life decision making 10/26/2014  . S/P total knee replacement using cement 04/03/2014  . Intertrigo 04/25/2013  . Urinary urgency 10/18/2012  . ATRIAL FIBRILLATION, PAROXYSMAL 10/17/2010  . ALLERGIC RHINITIS 01/23/2009  . ADENOCARCINOMA, COLON, CECUM 10/05/2008  . Coronary atherosclerosis 11/23/2007  . Hyperlipidemia 09/19/2007  . ANXIETY 09/19/2007  . Depression 09/19/2007  . GERD 09/19/2007  . DM due to underlying condition with diabetic nephropathy ([place]) 03/21/2007  . SYNDROME, CARPAL TUNNEL 03/21/2007  . Essential hypertension, benign 03/21/2007  . IBS 03/21/2007  . Osteoarthrosis, unspecified whether generalized or localized, unspecified site 03/21/2007   Past Medical History:  Diagnosis Date  . Adenocarcinoma, colon ([place])   . Anemia  iron deficiency  . Anxiety   . Carpal tunnel syndrome   . Chronic diarrhea   . Colon polyps    Tubular adenoma  . Degenerative disk disease   . Depression   . Diabetes mellitus, type 2 ([place])   . Dysrhythmia    h/o AFIB  . Fall at home 03/16/2014   no fx; multiple facial lacerations, abrasions and hematoma  . GERD (gastroesophageal reflux disease)   . Headache   . Hyperlipidemia   . Hypertension   . IBS (irritable bowel syndrome)   .  Osteoarthritis   . S/P coronary artery stent placement 2007  . Sciatica     Family History  Problem Relation Age of Onset  . Heart disease Mother     massive MI age 36  . Heart attack Mother   . Heart disease Father     Massive MI age 37  . Prostate cancer Father   . Colon cancer Neg Hx     Past Surgical History:  Procedure Laterality Date  . CARDIAC CATHETERIZATION  12/10   LAD stent patent. Insignificant CAD, otherwise EF 60-65%  . CARPAL TUNNEL RELEASE    . CATARACT EXTRACTION  03/2015  . CHOLECYSTECTOMY    . DILATION AND CURETTAGE OF UTERUS    . HEMATOMA EVACUATION     after knee replacement  . KNEE ARTHROSCOPY     bilateral  . LUMBAR LAMINECTOMY N/A 11/26/2015   Procedure: LUMBAR DECOMPRESSIVE LAMINECTOMY L3-4 AND L4-5, LEFT L2-3 HEMILAMINECTOMY;  Surgeon: Tonya Oto, MD;  Location: [facility];  Service: Orthopedics;  Laterality: N/A;  . PARTIAL COLECTOMY  11/2008   right, for adenocarcinoma  . REPLACEMENT TOTAL KNEE  01/3004   right, complicated by hemarthrosis   . REVERSE SHOULDER ARTHROPLASTY Right 05/28/2016  . REVERSE SHOULDER ARTHROPLASTY Right 05/28/2016   Procedure: REVERSE SHOULDER ARTHROPLASTY;  Surgeon: Tonya Pel, MD;  Location: [facility];  Service: Orthopedics;  Laterality: Right;  . TONSILLECTOMY    . TOTAL KNEE ARTHROPLASTY Left 04/03/2014   dr whitfield  . TOTAL KNEE ARTHROPLASTY Left 04/03/2014   Procedure: TOTAL KNEE ARTHROPLASTY;  Surgeon: Garald Balding, MD;  Location: [facility];  Service: Orthopedics;  Laterality: Left;  . TOTAL KNEE REVISION  03/08/2012   Procedure: TOTAL KNEE REVISION;  Surgeon: Garald Balding, MD;  Location: [facility];  Service: Orthopedics;  Laterality: Right;  removal total knee hardware and placement of antibiotic cement spacer and antibiotic beads  . TOTAL KNEE REVISION  05/17/2012   Procedure: TOTAL KNEE REVISION;  Surgeon: Garald Balding, MD;  Location: [facility];  Service: Orthopedics;  Laterality: Right;  right total knee  revision, removal of antibiotic spacer   Social History   Occupational History  . retired   .  Retired   Social History Main Topics  . Smoking status: Never Smoker  . Smokeless tobacco: Never Used  . Alcohol use No  . Drug use: No  . Sexual activity: No

## 2016-11-16 ENCOUNTER — Other Ambulatory Visit: Payer: Self-pay | Admitting: Gastroenterology

## 2016-11-24 ENCOUNTER — Encounter: Payer: Self-pay | Admitting: Family Medicine

## 2016-11-24 ENCOUNTER — Ambulatory Visit (INDEPENDENT_AMBULATORY_CARE_PROVIDER_SITE_OTHER): Payer: Medicare Other | Admitting: Family Medicine

## 2016-11-24 VITALS — BP 100/40 | HR 64 | Temp 98.5°F | Ht 65.0 in | Wt 151.8 lb

## 2016-11-24 DIAGNOSIS — J069 Acute upper respiratory infection, unspecified: Secondary | ICD-10-CM | POA: Diagnosis not present

## 2016-11-24 DIAGNOSIS — B9789 Other viral agents as the cause of diseases classified elsewhere: Secondary | ICD-10-CM | POA: Diagnosis not present

## 2016-11-24 NOTE — Progress Notes (Signed)
   Subjective:    Patient ID: Tonya [name], female    DOB: October 29, 1940, 76 y.o.   MRN: [id number]  Cough  This is a new problem. The current episode started in the past 7 days. The problem has been gradually worsening. The cough is productive of sputum. Associated symptoms include nasal congestion and a sore throat. Pertinent negatives include no chills, ear congestion, ear pain, fever, headaches, myalgias, postnasal drip, rhinorrhea, shortness of breath or wheezing. Associated symptoms comments:  No sinus pressure. The symptoms are aggravated by lying down ( keeping her up some at night, OTC cough supressant helped). Risk factors: nonsmoker  She has tried OTC cough suppressant for the symptoms. The treatment provided mild relief. There is no history of asthma, COPD, emphysema, environmental allergies or pneumonia.     BP has been low for months since addition of spironolactone... No lightheadedness. Keeping up with fluids. BP Readings from Last 3 Encounters:  11/24/16 (!) 100/40  07/30/16 130/64  05/30/16 (!) 102/38    Blood pressure (!) 100/40, pulse 64, temperature 98.5 F (36.9 C), temperature source Oral, height 5\' 5"  (1.651 m), weight 151 lb 12 oz (68.8 kg).   Review of Systems  Constitutional: Negative for chills and fever.  HENT: Positive for sore throat. Negative for ear pain, postnasal drip and rhinorrhea.   Respiratory: Positive for cough. Negative for shortness of breath and wheezing.   Musculoskeletal: Negative for myalgias.  Allergic/Immunologic: Negative for environmental allergies.  Neurological: Negative for headaches.       Objective:   Physical Exam  Constitutional: Vital signs are normal. She appears well-developed and well-nourished. She is cooperative.  Non-toxic appearance. She does not appear ill. No distress.  HENT:  Head: Normocephalic.  Right Ear: Hearing, tympanic membrane, external ear and ear canal normal. Tympanic membrane is not erythematous, not  retracted and not bulging.  Left Ear: Hearing, tympanic membrane, external ear and ear canal normal. Tympanic membrane is not erythematous, not retracted and not bulging.  Nose: Mucosal edema and rhinorrhea present. Right sinus exhibits no maxillary sinus tenderness and no frontal sinus tenderness. Left sinus exhibits no maxillary sinus tenderness and no frontal sinus tenderness.  Mouth/Throat: Uvula is midline, oropharynx is clear and moist and mucous membranes are normal.  Eyes: Conjunctivae, EOM and lids are normal. Pupils are equal, round, and reactive to light. Lids are everted and swept, no foreign bodies found.  Neck: Trachea normal and normal range of motion. Neck supple. Carotid bruit is not present. No thyroid mass and no thyromegaly present.  Cardiovascular: Normal rate, regular rhythm, S1 normal, S2 normal, normal heart sounds, intact distal pulses and normal pulses.  Exam reveals no gallop and no friction rub.   No murmur heard. Pulmonary/Chest: Effort normal and breath sounds normal. No tachypnea. No respiratory distress. She has no decreased breath sounds. She has no wheezes. She has no rhonchi. She has no rales.  Neurological: She is alert.  Skin: Skin is warm, dry and intact. No rash noted.  Psychiatric: Her speech is normal and behavior is normal. Judgment normal. Her mood appears not anxious. Cognition and memory are normal. She does not exhibit a depressed mood.          Assessment & Plan:

## 2016-11-24 NOTE — Patient Instructions (Signed)
Rest, fluids.  Cough suppressant: mucinex or robitussin.  Call if new fever > 100.4 F or not gradually improving in next 5-7 days.  Go to ER if severe shortness of breath.

## 2016-11-24 NOTE — Assessment & Plan Note (Signed)
Symptomatic care. No sign of bacterial infection and low probability flu.

## 2016-11-24 NOTE — Progress Notes (Signed)
Pre visit review using our clinic review tool, if applicable. No additional management support is needed unless otherwise documented below in the visit note. 

## 2016-12-16 ENCOUNTER — Other Ambulatory Visit: Payer: Self-pay | Admitting: Family Medicine

## 2017-01-22 ENCOUNTER — Other Ambulatory Visit: Payer: Self-pay | Admitting: Gastroenterology

## 2017-02-09 ENCOUNTER — Telehealth: Payer: Self-pay | Admitting: Family Medicine

## 2017-02-09 DIAGNOSIS — E1122 Type 2 diabetes mellitus with diabetic chronic kidney disease: Secondary | ICD-10-CM

## 2017-02-09 DIAGNOSIS — N183 Chronic kidney disease, stage 3 (moderate): Secondary | ICD-10-CM

## 2017-02-09 DIAGNOSIS — E0821 Diabetes mellitus due to underlying condition with diabetic nephropathy: Secondary | ICD-10-CM

## 2017-02-09 DIAGNOSIS — D649 Anemia, unspecified: Secondary | ICD-10-CM

## 2017-02-09 DIAGNOSIS — E782 Mixed hyperlipidemia: Secondary | ICD-10-CM

## 2017-02-09 NOTE — Telephone Encounter (Signed)
-----   Message from Eustace Pen, LPN sent at 3/00/9233  4:23 PM EDT ----- Regarding: Labs 5/16 Please place lab orders.   Traditional Medicare

## 2017-02-10 ENCOUNTER — Ambulatory Visit (INDEPENDENT_AMBULATORY_CARE_PROVIDER_SITE_OTHER): Payer: Medicare Other

## 2017-02-10 VITALS — BP 100/70 | HR 61 | Temp 98.0°F | Ht 65.5 in | Wt 158.8 lb

## 2017-02-10 DIAGNOSIS — D649 Anemia, unspecified: Secondary | ICD-10-CM | POA: Diagnosis not present

## 2017-02-10 DIAGNOSIS — Z0000 Encounter for general adult medical examination without abnormal findings: Secondary | ICD-10-CM | POA: Diagnosis not present

## 2017-02-10 DIAGNOSIS — Z961 Presence of intraocular lens: Secondary | ICD-10-CM | POA: Diagnosis not present

## 2017-02-10 DIAGNOSIS — N183 Chronic kidney disease, stage 3 (moderate): Secondary | ICD-10-CM | POA: Diagnosis not present

## 2017-02-10 DIAGNOSIS — E0821 Diabetes mellitus due to underlying condition with diabetic nephropathy: Secondary | ICD-10-CM | POA: Diagnosis not present

## 2017-02-10 DIAGNOSIS — E1122 Type 2 diabetes mellitus with diabetic chronic kidney disease: Secondary | ICD-10-CM

## 2017-02-10 DIAGNOSIS — H15101 Unspecified episcleritis, right eye: Secondary | ICD-10-CM | POA: Diagnosis not present

## 2017-02-10 DIAGNOSIS — E782 Mixed hyperlipidemia: Secondary | ICD-10-CM

## 2017-02-10 DIAGNOSIS — E119 Type 2 diabetes mellitus without complications: Secondary | ICD-10-CM | POA: Diagnosis not present

## 2017-02-10 LAB — CBC WITH DIFFERENTIAL/PLATELET
BASOS ABS: 0.1 10*3/uL (ref 0.0–0.1)
BASOS PCT: 0.5 % (ref 0.0–3.0)
Eosinophils Absolute: 0.2 10*3/uL (ref 0.0–0.7)
Eosinophils Relative: 1.8 % (ref 0.0–5.0)
HEMATOCRIT: 33.7 % — AB (ref 36.0–46.0)
Hemoglobin: 11.3 g/dL — ABNORMAL LOW (ref 12.0–15.0)
LYMPHS ABS: 1.6 10*3/uL (ref 0.7–4.0)
Lymphocytes Relative: 14 % (ref 12.0–46.0)
MCHC: 33.4 g/dL (ref 30.0–36.0)
MCV: 92.7 fl (ref 78.0–100.0)
Monocytes Absolute: 0.8 10*3/uL (ref 0.1–1.0)
Monocytes Relative: 6.4 % (ref 3.0–12.0)
NEUTROS ABS: 9.1 10*3/uL — AB (ref 1.4–7.7)
NEUTROS PCT: 77.3 % — AB (ref 43.0–77.0)
PLATELETS: 243 10*3/uL (ref 150.0–400.0)
RBC: 3.64 Mil/uL — ABNORMAL LOW (ref 3.87–5.11)
RDW: 12.8 % (ref 11.5–15.5)
WBC: 11.8 10*3/uL — ABNORMAL HIGH (ref 4.0–10.5)

## 2017-02-10 LAB — COMPREHENSIVE METABOLIC PANEL
ALT: 13 U/L (ref 0–35)
AST: 19 U/L (ref 0–37)
Albumin: 4.2 g/dL (ref 3.5–5.2)
Alkaline Phosphatase: 62 U/L (ref 39–117)
BILIRUBIN TOTAL: 0.4 mg/dL (ref 0.2–1.2)
BUN: 36 mg/dL — ABNORMAL HIGH (ref 6–23)
CHLORIDE: 113 meq/L — AB (ref 96–112)
CO2: 19 meq/L (ref 19–32)
Calcium: 9.2 mg/dL (ref 8.4–10.5)
Creatinine, Ser: 1.52 mg/dL — ABNORMAL HIGH (ref 0.40–1.20)
GFR: 35.26 mL/min — ABNORMAL LOW (ref >60.00)
GLUCOSE: 87 mg/dL (ref 70–99)
Potassium: 4.8 mEq/L (ref 3.5–5.1)
Sodium: 139 mEq/L (ref 135–145)
Total Protein: 7 g/dL (ref 6.0–8.3)

## 2017-02-10 LAB — LIPID PANEL
CHOL/HDL RATIO: 3
Cholesterol: 107 mg/dL (ref 0–200)
HDL: 38.4 mg/dL — ABNORMAL LOW (ref >39.00)
LDL CALC: 41 mg/dL (ref 0–99)
NONHDL: 68.89
Triglycerides: 140 mg/dL (ref 0.0–149.0)
VLDL: 28 mg/dL (ref 0.0–40.0)

## 2017-02-10 LAB — HM DIABETES EYE EXAM

## 2017-02-10 LAB — VITAMIN D 25 HYDROXY (VIT D DEFICIENCY, FRACTURES): VITD: 23.19 ng/mL — ABNORMAL LOW (ref 30.00–100.00)

## 2017-02-10 LAB — HEMOGLOBIN A1C: Hgb A1c MFr Bld: 5.7 % (ref 4.6–6.5)

## 2017-02-10 NOTE — Progress Notes (Signed)
Pre visit review using our clinic review tool, if applicable. No additional management support is needed unless otherwise documented below in the visit note. 

## 2017-02-10 NOTE — Progress Notes (Signed)
PCP notes:   Health maintenance:  A1C - completed Eye exam - future appt scheduled  Abnormal screenings:   Hearing - failed Fall risk - hx of fall with injury; medical treatment and surgery  Patient concerns:   Pt reports right eye pain with onset 02/06/17. Pain scale: 1/10. Eye is visibly red with clear watery discharge. Dr. Zenia Resides office was contacted to arrange same day appt for patient. Medication list, vital signs, and nurse note forwarded per request to Dr. Zenia Resides office.  Dr. Silvio Pate was consulted and agreed with referral to Dr. Katy Fitch.   Nurse concerns:  None  Next PCP appt:   02/23/17 @ 1000

## 2017-02-10 NOTE — Progress Notes (Signed)
   Subjective:    Patient ID: Tonya [name], female    DOB: August 27, 1941, 76 y.o.   MRN: [id number]  HPI I reviewed health advisor's note, was available for consultation, and agree with documentation and plan.    Review of Systems     Objective:   Physical Exam        Assessment & Plan:

## 2017-02-10 NOTE — Progress Notes (Signed)
Subjective:   Tonya [name] is a 76 y.o. female who presents for Medicare Annual (Subsequent) preventive examination.  Review of Systems:  N/A Cardiac Risk Factors include: advanced age (>60men, >51 women);diabetes mellitus;dyslipidemia;hypertension     Objective:     Vitals: BP 100/70 (BP Location: Left Arm, Patient Position: Sitting, Cuff Size: Normal)   Pulse 61   Temp 98 F (36.7 C) (Oral)   Ht 5' 5.5" (1.664 m) Comment: no shoes  Wt 158 lb 12 oz (72 kg)   SpO2 97%   BMI 26.02 kg/m   Body mass index is 26.02 kg/m.   Tobacco History  Smoking Status  . Never Smoker  Smokeless Tobacco  . Never Used     Counseling given: No   Past Medical History:  Diagnosis Date  . Adenocarcinoma, colon ([place])   . Anemia    iron deficiency  . Anxiety   . Carpal tunnel syndrome   . Chronic diarrhea   . Colon polyps    Tubular adenoma  . Degenerative disk disease   . Depression   . Diabetes mellitus, type 2 ([place])   . Dysrhythmia    h/o AFIB  . Fall at home 03/16/2014   no fx; multiple facial lacerations, abrasions and hematoma  . GERD (gastroesophageal reflux disease)   . Headache   . Hyperlipidemia   . Hypertension   . IBS (irritable bowel syndrome)   . Osteoarthritis   . S/P coronary artery stent placement 2007  . Sciatica    Past Surgical History:  Procedure Laterality Date  . CARDIAC CATHETERIZATION  12/10   LAD stent patent. Insignificant CAD, otherwise EF 60-65%  . CARPAL TUNNEL RELEASE    . CATARACT EXTRACTION  03/2015  . CHOLECYSTECTOMY    . DILATION AND CURETTAGE OF UTERUS    . HEMATOMA EVACUATION     after knee replacement  . KNEE ARTHROSCOPY     bilateral  . LUMBAR LAMINECTOMY N/A 11/26/2015   Procedure: LUMBAR DECOMPRESSIVE LAMINECTOMY L3-4 AND L4-5, LEFT L2-3 HEMILAMINECTOMY;  Surgeon: Jessy Oto, MD;  Location: [facility];  Service: Orthopedics;  Laterality: N/A;  . PARTIAL COLECTOMY  11/2008   right, for adenocarcinoma  . REPLACEMENT TOTAL KNEE   02/4331   right, complicated by hemarthrosis   . REVERSE SHOULDER ARTHROPLASTY Right 05/28/2016  . REVERSE SHOULDER ARTHROPLASTY Right 05/28/2016   Procedure: REVERSE SHOULDER ARTHROPLASTY;  Surgeon: Meredith Pel, MD;  Location: [facility];  Service: Orthopedics;  Laterality: Right;  . TONSILLECTOMY    . TOTAL KNEE ARTHROPLASTY Left 04/03/2014   dr whitfield  . TOTAL KNEE ARTHROPLASTY Left 04/03/2014   Procedure: TOTAL KNEE ARTHROPLASTY;  Surgeon: Garald Balding, MD;  Location: [facility];  Service: Orthopedics;  Laterality: Left;  . TOTAL KNEE REVISION  03/08/2012   Procedure: TOTAL KNEE REVISION;  Surgeon: Garald Balding, MD;  Location: [facility];  Service: Orthopedics;  Laterality: Right;  removal total knee hardware and placement of antibiotic cement spacer and antibiotic beads  . TOTAL KNEE REVISION  05/17/2012   Procedure: TOTAL KNEE REVISION;  Surgeon: Garald Balding, MD;  Location: [facility];  Service: Orthopedics;  Laterality: Right;  right total knee revision, removal of antibiotic spacer   Family History  Problem Relation Age of Onset  . Heart disease Mother        massive MI age 8  . Heart attack Mother   . Heart disease Father        Massive MI  age 53  . Prostate cancer Father   . Colon cancer Neg Hx    History  Sexual Activity  . Sexual activity: No    Outpatient Encounter Prescriptions as of 02/10/2017  Medication Sig  . aspirin 81 MG tablet Take 81 mg by mouth daily.  Marland Kitchen atorvastatin (LIPITOR) 20 MG tablet TAKE 1 TABLET BY MOUTH DAILY.  . calcium-vitamin D (OSCAL WITH D 500-200) 500-200 MG-UNIT per tablet Take 1 tablet by mouth 2 (two) times daily.    . carvedilol (COREG) 6.25 MG tablet TAKE 1 TABLET (6.25 MG TOTAL) BY MOUTH 2 (TWO) TIMES DAILY WITH A MEAL.  . cetirizine (ZYRTEC) 10 MG tablet Take 10 mg by mouth daily as needed for allergies.  . citalopram (CELEXA) 20 MG tablet TAKE 1 TABLET (20 MG TOTAL) BY MOUTH DAILY.  . clindamycin (CLEOCIN) 300 MG capsule Take 2  capsules (600mg ) one hour prior to procedure  . diltiazem (CARDIZEM CD) 240 MG 24 hr capsule TAKE ONE CAPSULE BY MOUTH EVERY DAY  . ferrous sulfate 325 (65 FE) MG EC tablet Take 325 mg by mouth 2 (two) times daily.  Marland Kitchen glipizide-metformin (METAGLIP) 2.5-250 MG tablet TAKE 2 TABLETS BY MOUTH TWICE A DAY BEFORE A MEAL  . losartan-hydrochlorothiazide (HYZAAR) 100-25 MG tablet TAKE 1 TABLET BY MOUTH DAILY.  Marland Kitchen nystatin cream (MYCOSTATIN) Apply 1 application topically 2 (two) times daily as needed for dry skin.  Marland Kitchen omeprazole (PRILOSEC) 20 MG capsule TAKE 2 CAPSULES BY MOUTH EVERY DAY (Patient taking differently: Take 20 mg by mouth daily)  . PREVALITE 4 g packet TAKE 1 PACKET TWICE A DAY WITH A MEAL, TAKE AT LEAST 2 HOURS AWAY FROM OTHER MEDICATIONS.  Marland Kitchen spironolactone (ALDACTONE) 25 MG tablet TAKE 1 TABLET (25 MG TOTAL) BY MOUTH DAILY.   No facility-administered encounter medications on file as of 02/10/2017.     Activities of Daily Living In your present state of health, do you have any difficulty performing the following activities: 02/10/2017 05/28/2016  Hearing? N -  Vision? N -  Difficulty concentrating or making decisions? N -  Walking or climbing stairs? N -  Dressing or bathing? N -  Doing errands, shopping? N N  Preparing Food and eating ? N -  Using the Toilet? N -  In the past six months, have you accidently leaked urine? Y -  Do you have problems with loss of bowel control? N -  Managing your Medications? N -  Managing your Finances? N -  Housekeeping or managing your Housekeeping? N -  Some recent data might be hidden    Patient Care Team: [name], MD as PCP - General    Assessment:     Hearing Screening   125Hz  250Hz  500Hz  1000Hz  2000Hz  3000Hz  4000Hz  6000Hz  8000Hz   Right ear:   40 40 40  0    Left ear:   40 40 40  0    Vision Screening Comments: Last vision exam in May 2017; Future appt scheduled 02/16/17   Exercise Activities and Dietary recommendations Current  Exercise Habits: The patient does not participate in regular exercise at present, Exercise limited by: None identified  Goals    . medication management (pt-stated)          Starting 02/10/2017, I will continue to take medications as prescribed.       Fall Risk Fall Risk  02/10/2017 10/29/2015 10/26/2014 10/20/2013 10/18/2012  Falls in the past year? Yes No Yes Yes Yes  Number falls  in past yr: 1 - 1 1 -  Injury with Fall? Yes - - Yes -  Risk Factor Category  - - High Fall Risk - -  Risk for fall due to : - - History of fall(s) History of fall(s) Other (Comment)   Depression Screen PHQ 2/9 Scores 02/10/2017 10/29/2015 10/26/2014 10/20/2013  PHQ - 2 Score 0 1 1 1   PHQ- 9 Score - - - -     Cognitive Function MMSE - Mini Mental State Exam 02/10/2017  Orientation to time 5  Orientation to Place 5  Registration 3  Attention/ Calculation 0  Recall 3  Language- name 2 objects 0  Language- repeat 1  Language- follow 3 step command 3  Language- read & follow direction 0  Write a sentence 0  Copy design 0  Total score 20     PLEASE NOTE: A Mini-Cog screen was completed. Maximum score is 20. A value of 0 denotes this part of [place] MMSE was not completed or the patient failed this part of the Mini-Cog screening.   Mini-Cog Screening Orientation to Time - Max 5 pts Orientation to Place - Max 5 pts Registration - Max 3 pts Recall - Max 3 pts Language Repeat - Max 1 pts Language Follow 3 Step Command - Max 3 pts     Immunization History  Administered Date(s) Administered  . Influenza Split 06/29/2011, 07/08/2011, 06/17/2013  . Influenza Whole 07/01/2007, 06/18/2008, 07/12/2009, 07/17/2010  . Influenza, High Dose Seasonal PF 06/25/2014, 06/10/2015, 06/06/2016  . Pneumococcal Conjugate-13 10/26/2014  . Pneumococcal Polysaccharide-23 09/29/2003, 05/20/2012  . Td 07/12/2009   Screening Tests Health Maintenance  Topic Date Due  . OPHTHALMOLOGY EXAM  02/16/2017 (Originally 02/10/2017)   . INFLUENZA VACCINE  04/28/2017  . FOOT EXAM  07/30/2017  . HEMOGLOBIN A1C  08/13/2017  . MAMMOGRAM  10/28/2017  . COLONOSCOPY  12/14/2017  . TETANUS/TDAP  07/13/2019  . DEXA SCAN  Completed  . PNA vac Low Risk Adult  Completed      Plan:     I have personally reviewed and addressed the Medicare Annual Wellness questionnaire and have noted the following in the patient's chart:  A. Medical and social history B. Use of alcohol, tobacco or illicit drugs  C. Current medications and supplements D. Functional ability and status E.  Nutritional status F.  Physical activity G. Advance directives H. List of other physicians I.  Hospitalizations, surgeries, and ER visits in previous 12 months J.  [place] to include hearing, vision, cognitive, depression L. Referrals and appointments - none  In addition, I have reviewed and discussed with patient certain preventive protocols, quality metrics, and best practice recommendations. A written personalized care plan for preventive services as well as general preventive health recommendations were provided to patient.  See attached scanned questionnaire for additional information.   Signed,   Lindell Noe, MHA, BS, LPN Health Coach' Lindell Noe, [place]  6/54/6503

## 2017-02-10 NOTE — Patient Instructions (Signed)
Tonya [name] , Thank you for taking time to come for your Medicare Wellness Visit. I appreciate your ongoing commitment to your health goals. Please review the following plan we discussed and let me know if I can assist you in the future.   These are the goals we discussed: Goals    . medication management (pt-stated)          Starting 02/10/2017, I will continue to take medications as prescribed.        This is a list of the screening recommended for you and due dates:  Health Maintenance  Topic Date Due  . Eye exam for diabetics  02/16/2017*  . Flu Shot  04/28/2017  . Complete foot exam   07/30/2017  . Hemoglobin A1C  08/13/2017  . Mammogram  10/28/2017  . Colon Cancer Screening  12/14/2017  . Tetanus Vaccine  07/13/2019  . DEXA scan (bone density measurement)  Completed  . Pneumonia vaccines  Completed  *Topic was postponed. The date shown is not the original due date.   Preventive Care for Adults  A healthy lifestyle and preventive care can promote health and wellness. Preventive health guidelines for adults include the following key practices.  . A routine yearly physical is a good way to check with your health care provider about your health and preventive screening. It is a chance to share any concerns and updates on your health and to receive a thorough exam.  . Visit your dentist for a routine exam and preventive care every 6 months. Brush your teeth twice a day and floss once a day. Good oral hygiene prevents tooth decay and gum disease.  . The frequency of eye exams is based on your age, health, family medical history, use  of contact lenses, and other factors. Follow your health care provider's ecommendations for frequency of eye exams.  . Eat a healthy diet. Foods like vegetables, fruits, whole grains, low-fat dairy products, and lean protein foods contain the nutrients you need without too many calories. Decrease your intake of foods high in solid fats, added sugars,  and salt. Eat the right amount of calories for you. Get information about a proper diet from your health care provider, if necessary.  . Regular physical exercise is one of the most important things you can do for your health. Most adults should get at least 150 minutes of moderate-intensity exercise (any activity that increases your heart rate and causes you to sweat) each week. In addition, most adults need muscle-strengthening exercises on 2 or more days a week.  Silver Sneakers may be a benefit available to you. To determine eligibility, you may visit the website: www.silversneakers.com or contact program at (262)025-6297 Mon-Fri between 8AM-8PM.   . Maintain a healthy weight. The body mass index (BMI) is a screening tool to identify possible weight problems. It provides an estimate of body fat based on height and weight. Your health care provider can find your BMI and can help you achieve or maintain a healthy weight.   For adults 20 years and older: ? A BMI below 18.5 is considered underweight. ? A BMI of 18.5 to 24.9 is normal. ? A BMI of 25 to 29.9 is considered overweight. ? A BMI of 30 and above is considered obese.   . Maintain normal blood lipids and cholesterol levels by exercising and minimizing your intake of saturated fat. Eat a balanced diet with plenty of fruit and vegetables. Blood tests for lipids and cholesterol should begin at  age 71 and be repeated every 5 years. If your lipid or cholesterol levels are high, you are over 50, or you are at high risk for heart disease, you may need your cholesterol levels checked more frequently. Ongoing high lipid and cholesterol levels should be treated with medicines if diet and exercise are not working.  . If you smoke, find out from your health care provider how to quit. If you do not use tobacco, please do not start.  . If you choose to drink alcohol, please do not consume more than 2 drinks per day. One drink is considered to be 12  ounces (355 mL) of beer, 5 ounces (148 mL) of wine, or 1.5 ounces (44 mL) of liquor.  . If you are 53-63 years old, ask your health care provider if you should take aspirin to prevent strokes.  . Use sunscreen. Apply sunscreen liberally and repeatedly throughout the day. You should seek shade when your shadow is shorter than you. Protect yourself by wearing long sleeves, pants, a wide-brimmed hat, and sunglasses year round, whenever you are outdoors.  . Once a month, do a whole body skin exam, using a mirror to look at the skin on your back. Tell your health care provider of new moles, moles that have irregular borders, moles that are larger than a pencil eraser, or moles that have changed in shape or color.

## 2017-02-16 ENCOUNTER — Encounter: Payer: Medicare Other | Admitting: Family Medicine

## 2017-02-16 DIAGNOSIS — H40013 Open angle with borderline findings, low risk, bilateral: Secondary | ICD-10-CM | POA: Diagnosis not present

## 2017-02-16 DIAGNOSIS — Z961 Presence of intraocular lens: Secondary | ICD-10-CM | POA: Diagnosis not present

## 2017-02-16 DIAGNOSIS — H04123 Dry eye syndrome of bilateral lacrimal glands: Secondary | ICD-10-CM | POA: Diagnosis not present

## 2017-02-16 DIAGNOSIS — H26493 Other secondary cataract, bilateral: Secondary | ICD-10-CM | POA: Diagnosis not present

## 2017-02-16 DIAGNOSIS — H15101 Unspecified episcleritis, right eye: Secondary | ICD-10-CM | POA: Diagnosis not present

## 2017-02-16 DIAGNOSIS — H43813 Vitreous degeneration, bilateral: Secondary | ICD-10-CM | POA: Diagnosis not present

## 2017-02-16 DIAGNOSIS — E119 Type 2 diabetes mellitus without complications: Secondary | ICD-10-CM | POA: Diagnosis not present

## 2017-02-23 ENCOUNTER — Encounter: Payer: Medicare Other | Admitting: Family Medicine

## 2017-02-23 ENCOUNTER — Other Ambulatory Visit: Payer: Self-pay | Admitting: Family Medicine

## 2017-02-26 ENCOUNTER — Encounter: Payer: Self-pay | Admitting: Family Medicine

## 2017-02-26 ENCOUNTER — Ambulatory Visit (INDEPENDENT_AMBULATORY_CARE_PROVIDER_SITE_OTHER): Payer: Medicare Other | Admitting: Family Medicine

## 2017-02-26 DIAGNOSIS — F331 Major depressive disorder, recurrent, moderate: Secondary | ICD-10-CM | POA: Diagnosis not present

## 2017-02-26 DIAGNOSIS — E0821 Diabetes mellitus due to underlying condition with diabetic nephropathy: Secondary | ICD-10-CM

## 2017-02-26 DIAGNOSIS — N183 Chronic kidney disease, stage 3 (moderate): Secondary | ICD-10-CM

## 2017-02-26 DIAGNOSIS — H5711 Ocular pain, right eye: Secondary | ICD-10-CM

## 2017-02-26 DIAGNOSIS — D638 Anemia in other chronic diseases classified elsewhere: Secondary | ICD-10-CM

## 2017-02-26 DIAGNOSIS — E1122 Type 2 diabetes mellitus with diabetic chronic kidney disease: Secondary | ICD-10-CM | POA: Diagnosis not present

## 2017-02-26 DIAGNOSIS — I480 Paroxysmal atrial fibrillation: Secondary | ICD-10-CM | POA: Diagnosis not present

## 2017-02-26 DIAGNOSIS — E782 Mixed hyperlipidemia: Secondary | ICD-10-CM | POA: Diagnosis not present

## 2017-02-26 LAB — HM DIABETES FOOT EXAM

## 2017-02-26 MED ORDER — VITAMIN D (ERGOCALCIFEROL) 1.25 MG (50000 UNIT) PO CAPS: 50000.0000 [IU] | ORAL_CAPSULE | ORAL | 0 refills | Status: DC

## 2017-02-26 NOTE — Assessment & Plan Note (Signed)
Rate controlled and euvolemic. Followed by Cardiology.

## 2017-02-26 NOTE — Assessment & Plan Note (Signed)
Well controlled. Continue current medication.  

## 2017-02-26 NOTE — Patient Instructions (Addendum)
Hold current vit D Ca supplement.  Complete 12 weeks of vit D weekly, then restart OTC supplement.  Return to see Dr. Katy Fitch for repeat eval of right eye.

## 2017-02-26 NOTE — Assessment & Plan Note (Signed)
Episcleritis.. Return to Dr. Katy Fitch given not resolved as expected. NO clear allergy or infeciton component.  No facial rash such as shingles.

## 2017-02-26 NOTE — Progress Notes (Signed)
Pre visit review using our clinic review tool, if applicable. No additional management support is needed unless otherwise documented below in the visit note. 

## 2017-02-26 NOTE — Progress Notes (Signed)
Subjective:    Patient ID: Tonya [name], female    DOB: 1941-08-09, 76 y.o.   MRN: [id number]  HPI   The patient presents for  complete physical and review of chronic health problems.   The patient saw Candis Musa, LPN for medicare wellness. Note reviewed in detail and important notes copied below. Health maintenance:  A1C - completed Eye exam - future appt scheduled  Abnormal screenings:   Hearing - failed Fall risk - hx of fall with injury; medical treatment and surgery  Patient concerns:  Pt reports right eye pain with onset 02/06/17. Pain scale: 1/10. Eye is visibly red with clear watery discharge. Dr. Zenia Resides office was contacted to arrange same day appt for patient. Medication list, vital signs, and nurse note forwarded per request to Dr. Zenia Resides office.  Dr. Silvio Pate was consulted and agreed with referral to Dr. Katy Fitch.   02/26/17  Today:   Right eye pain:  She saw Dr. Katy Fitch and he gave her eye drops...  Reviewed note.. Was given NSAID eye drops for episcleritis. Now pain is better but still off and on.  She will let him know.   Diabetes:   Good control on metaglip Lab Results  Component Value Date   HGBA1C 5.7 02/10/2017  Using medications without difficulties: Hypoglycemic episodes:none Hyperglycemic episodes: none Feet problems: no ulcers Blood Sugars averaging: 86 eye exam within last year: yes  Elevated Cholesterol:  Well controlled on atorvastatin 20 mg daily Lab Results  Component Value Date   CHOL 107 02/10/2017   HDL 38.40 (L) 02/10/2017   LDLCALC 41 02/10/2017   LDLDIRECT 50.0 10/22/2014   TRIG 140.0 02/10/2017   CHOLHDL 3 02/10/2017  Using medications without problems: Muscle aches:  Diet compliance: good Exercise: walking Other complaints:   VIT D def: has vit d3 supplement 2 times daily.  Left shoulder replacement in 10/2016 Dr Marlou Sa.. Doing well.  Hypertension:  Moderate control on  diltiazem, spironolactone, losartan HCTZ   BP  Readings from Last 3 Encounters:  02/26/17 140/62  02/10/17 100/70  11/24/16 (!) 100/40  Using medication without problems or lightheadedness:  none Chest pain with exertion: none Edema:none Short of breath: none Average home BPs: not checking Other issues:  CAD and afib: Rate controlled, followed by Cardiology Dr. Percival Spanish   CKD: stable  Social History /Family History/Past Medical History reviewed in detail and updated in EMR if needed. Blood pressure 140/62, pulse 66, temperature 97.7 F (36.5 C), height 5' 5.5" (1.664 m), weight 160 lb (72.6 kg), SpO2 98 %.    Review of Systems  Constitutional: Positive for fatigue. Negative for fever.  HENT: Negative for congestion.   Eyes: Negative for pain.  Respiratory: Negative for cough and shortness of breath.   Cardiovascular: Negative for chest pain, palpitations and leg swelling.  Gastrointestinal: Negative for abdominal pain.  Genitourinary: Negative for dysuria and vaginal bleeding.  Musculoskeletal: Positive for arthralgias and back pain.  Neurological: Negative for syncope, light-headedness and headaches.  Psychiatric/Behavioral: Negative for dysphoric mood.       Objective:   Physical Exam  Constitutional: Vital signs are normal. She appears well-developed and well-nourished. She is cooperative.  Non-toxic appearance. She does not appear ill. No distress.  HENT:  Head: Normocephalic.  Right Ear: Hearing, tympanic membrane, external ear and ear canal normal.  Left Ear: Hearing, tympanic membrane, external ear and ear canal normal.  Nose: Nose normal.  Eyes: EOM and lids are normal. Pupils are equal, round, and reactive to  light. Lids are everted and swept, no foreign bodies found. Right conjunctiva is injected.  Neck: Trachea normal and normal range of motion. Neck supple. Carotid bruit is not present. No thyroid mass and no thyromegaly present.  Cardiovascular: Normal rate, regular rhythm, S1 normal, S2 normal, normal  heart sounds and intact distal pulses.  Exam reveals no gallop.   No murmur heard. Pulmonary/Chest: Effort normal and breath sounds normal. No respiratory distress. She has no wheezes. She has no rhonchi. She has no rales.  Abdominal: Soft. Normal appearance and bowel sounds are normal. She exhibits no distension, no fluid wave, no abdominal bruit and no mass. There is no hepatosplenomegaly. There is no tenderness. There is no rebound, no guarding and no CVA tenderness. No hernia.  Lymphadenopathy:    She has no cervical adenopathy.    She has no axillary adenopathy.  Neurological: She is alert. She has normal strength. No cranial nerve deficit or sensory deficit.  Skin: Skin is warm, dry and intact. No rash noted.  Psychiatric: Her speech is normal and behavior is normal. Judgment normal. Her mood appears not anxious. Cognition and memory are normal. She does not exhibit a depressed mood.          Assessment & Plan:  The patient's preventative maintenance and recommended screening tests for an annual wellness exam were reviewed in full today. Brought up to date unless services declined.  Counselled on the importance of diet, exercise, and its role in overall health and mortality. The patient's FH and SH was reviewed, including their home life, tobacco status, and drug and alcohol status.   DEXA 10/2014 normal  Repeat in 5 years, 2021  Mammogram nml in 09/2016 Uptodate with colonoscopy with hx of colon cancer.. Last 11/2012, repeat in 5 years. PAP/DVE not indicated at this age. No vaginal bleeding.  Nonsmoker  Vaccine: uptodate except for shingles

## 2017-02-26 NOTE — Assessment & Plan Note (Addendum)
Due to chronic disease.

## 2017-02-26 NOTE — Assessment & Plan Note (Signed)
Stable control on celexa. 

## 2017-02-26 NOTE — Assessment & Plan Note (Signed)
Stable control on current meds. ON ACEI.

## 2017-03-18 ENCOUNTER — Other Ambulatory Visit: Payer: Self-pay | Admitting: Gastroenterology

## 2017-03-23 ENCOUNTER — Other Ambulatory Visit: Payer: Self-pay | Admitting: Family Medicine

## 2017-03-23 NOTE — Telephone Encounter (Signed)
Received refill electronically Last office visit 03/15/17 Last refill 06/14/15 #180/3 See drug warning with Citalopram

## 2017-04-10 ENCOUNTER — Other Ambulatory Visit: Payer: Self-pay | Admitting: Family Medicine

## 2017-05-01 ENCOUNTER — Other Ambulatory Visit: Payer: Self-pay | Admitting: Cardiology

## 2017-05-03 NOTE — Telephone Encounter (Signed)
REFILL 

## 2017-05-18 ENCOUNTER — Other Ambulatory Visit: Payer: Self-pay | Admitting: Family Medicine

## 2017-05-19 ENCOUNTER — Other Ambulatory Visit: Payer: Self-pay | Admitting: Cardiology

## 2017-05-21 ENCOUNTER — Other Ambulatory Visit: Payer: Self-pay | Admitting: Gastroenterology

## 2017-05-26 DIAGNOSIS — Z23 Encounter for immunization: Secondary | ICD-10-CM | POA: Diagnosis not present

## 2017-06-25 ENCOUNTER — Other Ambulatory Visit: Payer: Self-pay | Admitting: Family Medicine

## 2017-07-21 ENCOUNTER — Other Ambulatory Visit: Payer: Self-pay | Admitting: Gastroenterology

## 2017-07-29 ENCOUNTER — Other Ambulatory Visit: Payer: Self-pay | Admitting: Cardiology

## 2017-08-13 ENCOUNTER — Other Ambulatory Visit: Payer: Self-pay

## 2017-08-13 MED ORDER — CHOLESTYRAMINE LIGHT 4 G PO PACK: PACK | ORAL | 3 refills | Status: DC

## 2017-08-23 ENCOUNTER — Telehealth: Payer: Self-pay | Admitting: Family Medicine

## 2017-08-23 DIAGNOSIS — E1122 Type 2 diabetes mellitus with diabetic chronic kidney disease: Secondary | ICD-10-CM

## 2017-08-23 DIAGNOSIS — D638 Anemia in other chronic diseases classified elsewhere: Secondary | ICD-10-CM

## 2017-08-23 DIAGNOSIS — E782 Mixed hyperlipidemia: Secondary | ICD-10-CM

## 2017-08-23 DIAGNOSIS — E0821 Diabetes mellitus due to underlying condition with diabetic nephropathy: Secondary | ICD-10-CM

## 2017-08-23 DIAGNOSIS — N183 Chronic kidney disease, stage 3 (moderate): Secondary | ICD-10-CM

## 2017-08-23 NOTE — Telephone Encounter (Signed)
-----   Message from Inocencio Homes, [place] sent at 08/16/2017  1:37 PM EST ----- Regarding: LABS Patient is scheduled for labs on 08/26/17, can you please order labs. Thank you.

## 2017-08-25 DIAGNOSIS — H40013 Open angle with borderline findings, low risk, bilateral: Secondary | ICD-10-CM | POA: Diagnosis not present

## 2017-08-25 DIAGNOSIS — H04123 Dry eye syndrome of bilateral lacrimal glands: Secondary | ICD-10-CM | POA: Diagnosis not present

## 2017-08-25 DIAGNOSIS — Z961 Presence of intraocular lens: Secondary | ICD-10-CM | POA: Diagnosis not present

## 2017-08-25 DIAGNOSIS — H26493 Other secondary cataract, bilateral: Secondary | ICD-10-CM | POA: Diagnosis not present

## 2017-08-25 DIAGNOSIS — E119 Type 2 diabetes mellitus without complications: Secondary | ICD-10-CM | POA: Diagnosis not present

## 2017-08-25 LAB — HM DIABETES EYE EXAM

## 2017-08-26 ENCOUNTER — Other Ambulatory Visit (INDEPENDENT_AMBULATORY_CARE_PROVIDER_SITE_OTHER): Payer: Medicare Other

## 2017-08-26 DIAGNOSIS — E0821 Diabetes mellitus due to underlying condition with diabetic nephropathy: Secondary | ICD-10-CM

## 2017-08-26 DIAGNOSIS — E782 Mixed hyperlipidemia: Secondary | ICD-10-CM | POA: Diagnosis not present

## 2017-08-26 DIAGNOSIS — E1121 Type 2 diabetes mellitus with diabetic nephropathy: Secondary | ICD-10-CM

## 2017-08-26 LAB — COMPREHENSIVE METABOLIC PANEL
ALBUMIN: 4.2 g/dL (ref 3.5–5.2)
ALK PHOS: 72 U/L (ref 39–117)
ALT: 9 U/L (ref 0–35)
AST: 15 U/L (ref 0–37)
BILIRUBIN TOTAL: 0.5 mg/dL (ref 0.2–1.2)
BUN: 37 mg/dL — ABNORMAL HIGH (ref 6–23)
CALCIUM: 9.3 mg/dL (ref 8.4–10.5)
CO2: 21 meq/L (ref 19–32)
CREATININE: 1.97 mg/dL — AB (ref 0.40–1.20)
Chloride: 110 mEq/L (ref 96–112)
GFR: 26.1 mL/min — AB (ref >60.00)
Glucose, Bld: 81 mg/dL (ref 70–99)
Potassium: 5 mEq/L (ref 3.5–5.1)
Sodium: 138 mEq/L (ref 135–145)
Total Protein: 7.1 g/dL (ref 6.0–8.3)

## 2017-08-26 LAB — LIPID PANEL
CHOLESTEROL: 99 mg/dL (ref 0–200)
HDL: 36.7 mg/dL — AB (ref >39.00)
LDL Cholesterol: 43 mg/dL (ref 0–99)
NonHDL: 62.38
TRIGLYCERIDES: 96 mg/dL (ref 0.0–149.0)
Total CHOL/HDL Ratio: 3
VLDL: 19.2 mg/dL (ref 0.0–40.0)

## 2017-08-26 LAB — HEMOGLOBIN A1C: Hgb A1c MFr Bld: 5.3 % (ref 4.6–6.5)

## 2017-08-27 ENCOUNTER — Other Ambulatory Visit: Payer: Self-pay | Admitting: Cardiology

## 2017-08-31 ENCOUNTER — Ambulatory Visit (INDEPENDENT_AMBULATORY_CARE_PROVIDER_SITE_OTHER): Payer: Medicare Other | Admitting: Family Medicine

## 2017-08-31 ENCOUNTER — Encounter: Payer: Self-pay | Admitting: Family Medicine

## 2017-08-31 VITALS — BP 128/60 | HR 60 | Temp 97.6°F | Ht 65.5 in | Wt 167.2 lb

## 2017-08-31 DIAGNOSIS — M7741 Metatarsalgia, right foot: Secondary | ICD-10-CM

## 2017-08-31 DIAGNOSIS — N183 Chronic kidney disease, stage 3 unspecified: Secondary | ICD-10-CM

## 2017-08-31 DIAGNOSIS — E1122 Type 2 diabetes mellitus with diabetic chronic kidney disease: Secondary | ICD-10-CM | POA: Diagnosis not present

## 2017-08-31 DIAGNOSIS — I10 Essential (primary) hypertension: Secondary | ICD-10-CM | POA: Diagnosis not present

## 2017-08-31 DIAGNOSIS — E0821 Diabetes mellitus due to underlying condition with diabetic nephropathy: Secondary | ICD-10-CM

## 2017-08-31 DIAGNOSIS — E782 Mixed hyperlipidemia: Secondary | ICD-10-CM | POA: Diagnosis not present

## 2017-08-31 LAB — HM DIABETES FOOT EXAM

## 2017-08-31 NOTE — Assessment & Plan Note (Signed)
Pt very hesitant to hold DM meds. Will hold metformin for now given GFR.

## 2017-08-31 NOTE — Assessment & Plan Note (Signed)
Well controlled. Continue current medication.  

## 2017-08-31 NOTE — Progress Notes (Signed)
Subjective:    Patient ID: Tonya [name], female    DOB: 1941-01-12, 76 y.o.   MRN: [id number]  HPI  76 year old female presents for 6 month follow up DM.  Diabetes:   She has decreased glipizide metformin to 1 tab twice daily Lab Results  Component Value Date   HGBA1C 5.3 08/26/2017  Using medications without difficulties: Hypoglycemic episodes:no symptoms Hyperglycemic episodes:  none Feet problems: none Blood Sugars averaging: not checking as trouble getting test strips. eye exam within last year: 01/2017   Hypertension:    On coreg, diltiazem, losartan HCTZ, spironolactone BP Readings from Last 3 Encounters:  08/31/17 128/60  02/26/17 140/62  02/10/17 100/70  Using medication without problems or lightheadedness:  none Chest pain with exertion: none Edema: none Short of breath: mild Average home BPs: stable Other issues: CAD, afib..   Elevated Cholesterol:  LDL at goal on statin Lab Results  Component Value Date   CHOL 99 08/26/2017   HDL 36.70 (L) 08/26/2017   LDLCALC 43 08/26/2017   LDLDIRECT 50.0 10/22/2014   TRIG 96.0 08/26/2017   CHOLHDL 3 08/26/2017  Using medications without problems: none Muscle aches: none Diet compliance: great Exercise: walking minmally Other complaints:  CKD.Marland Kitchen Slight worsening  Social History /Family History/Past Medical History reviewed in detail and updated in EMR if needed. Blood pressure 128/60, pulse 60, temperature 97.6 F (36.4 C), temperature source Oral, height 5' 5.5" (1.664 m), weight 167 lb 4 oz (75.9 kg), SpO2 98 %.  Review of Systems  Constitutional: Negative for fatigue and fever.  HENT: Negative for congestion.   Eyes: Negative for pain.  Respiratory: Negative for cough and shortness of breath.   Cardiovascular: Negative for chest pain, palpitations and leg swelling.  Gastrointestinal: Negative for abdominal pain.  Genitourinary: Positive for frequency. Negative for dysuria and vaginal bleeding.       Since  starting spironolactone.  Musculoskeletal: Negative for back pain.  Neurological: Negative for syncope, light-headedness and headaches.  Psychiatric/Behavioral: Negative for dysphoric mood.       Objective:   Physical Exam  Constitutional: Vital signs are normal. She appears well-developed and well-nourished. She is cooperative.  Non-toxic appearance. She does not appear ill. No distress.  HENT:  Head: Normocephalic.  Right Ear: Hearing, tympanic membrane, external ear and ear canal normal. Tympanic membrane is not erythematous, not retracted and not bulging.  Left Ear: Hearing, tympanic membrane, external ear and ear canal normal. Tympanic membrane is not erythematous, not retracted and not bulging.  Nose: No mucosal edema or rhinorrhea. Right sinus exhibits no maxillary sinus tenderness and no frontal sinus tenderness. Left sinus exhibits no maxillary sinus tenderness and no frontal sinus tenderness.  Mouth/Throat: Uvula is midline, oropharynx is clear and moist and mucous membranes are normal.  Eyes: Conjunctivae, EOM and lids are normal. Pupils are equal, round, and reactive to light. Lids are everted and swept, no foreign bodies found.  Neck: Trachea normal and normal range of motion. Neck supple. Carotid bruit is not present. No thyroid mass and no thyromegaly present.  Cardiovascular: Normal rate, regular rhythm, S1 normal, S2 normal, normal heart sounds, intact distal pulses and normal pulses. Exam reveals no gallop and no friction rub.  No murmur heard. Pulmonary/Chest: Effort normal and breath sounds normal. No tachypnea. No respiratory distress. She has no decreased breath sounds. She has no wheezes. She has no rhonchi. She has no rales.  Abdominal: Soft. Normal appearance and bowel sounds are normal. There is no  tenderness.  Neurological: She is alert.  Skin: Skin is warm, dry and intact. No rash noted.  Psychiatric: Her speech is normal and behavior is normal. Judgment and  thought content normal. Her mood appears not anxious. Cognition and memory are normal. She does not exhibit a depressed mood.     Diabetic foot exam: Normal inspection.. ttp over metatarsal heads. No skin breakdown No calluses  Normal DP pulses Normal sensation to light touch and monofilament Nails normal      Assessment & Plan:

## 2017-08-31 NOTE — Assessment & Plan Note (Signed)
Worsened control in last 6 months? Due to overdiuresis. ? additiona of spironolactone in last year.  Increase fluids and recheck. Hold metformin until renal panel  shows GFR > 30.

## 2017-08-31 NOTE — Assessment & Plan Note (Signed)
Trial of metatarsal pads.

## 2017-08-31 NOTE — Patient Instructions (Addendum)
Increase fluids some, hold metformin.. return for lab only viit to check kidney function.  No ibuprofen, aleve. If kidney function not increase.. We will need to stay off metformin.  Follow blood sugars.Marland Kitchen if running < 60 or feeling lightheaded or shaky.. We will either need to change to metformin alone versus stopping DM med altogether.   Can try metatarsal pad .Marland Kitchen Call if pain not improving for referral to podiatry.

## 2017-09-02 DIAGNOSIS — H26491 Other secondary cataract, right eye: Secondary | ICD-10-CM | POA: Diagnosis not present

## 2017-09-02 DIAGNOSIS — Z961 Presence of intraocular lens: Secondary | ICD-10-CM | POA: Diagnosis not present

## 2017-09-07 ENCOUNTER — Other Ambulatory Visit: Payer: Medicare Other

## 2017-09-07 ENCOUNTER — Ambulatory Visit: Payer: Medicare Other | Admitting: Family Medicine

## 2017-09-08 ENCOUNTER — Telehealth: Payer: Self-pay | Admitting: Family Medicine

## 2017-09-08 DIAGNOSIS — N183 Chronic kidney disease, stage 3 unspecified: Secondary | ICD-10-CM

## 2017-09-08 DIAGNOSIS — E1122 Type 2 diabetes mellitus with diabetic chronic kidney disease: Secondary | ICD-10-CM

## 2017-09-08 DIAGNOSIS — D638 Anemia in other chronic diseases classified elsewhere: Secondary | ICD-10-CM

## 2017-09-08 DIAGNOSIS — E782 Mixed hyperlipidemia: Secondary | ICD-10-CM

## 2017-09-08 DIAGNOSIS — E0821 Diabetes mellitus due to underlying condition with diabetic nephropathy: Secondary | ICD-10-CM

## 2017-09-08 NOTE — Telephone Encounter (Signed)
Already had labs dione 11/29.

## 2017-09-08 NOTE — Telephone Encounter (Signed)
-----   Message from Inocencio Homes, [place] sent at 09/02/2017  8:38 AM EST ----- Regarding: labs Patient has a lab appointment on 09/06/17, can you please put in labs. Thank you.

## 2017-09-13 ENCOUNTER — Other Ambulatory Visit (INDEPENDENT_AMBULATORY_CARE_PROVIDER_SITE_OTHER): Payer: Medicare Other

## 2017-09-13 DIAGNOSIS — E1122 Type 2 diabetes mellitus with diabetic chronic kidney disease: Secondary | ICD-10-CM | POA: Diagnosis not present

## 2017-09-13 DIAGNOSIS — N183 Chronic kidney disease, stage 3 unspecified: Secondary | ICD-10-CM

## 2017-09-13 LAB — RENAL FUNCTION PANEL
Albumin: 4.2 g/dL (ref 3.5–5.2)
BUN: 33 mg/dL — ABNORMAL HIGH (ref 6–23)
CHLORIDE: 111 meq/L (ref 96–112)
CO2: 22 meq/L (ref 19–32)
CREATININE: 1.81 mg/dL — AB (ref 0.40–1.20)
Calcium: 9 mg/dL (ref 8.4–10.5)
GFR: 28.78 mL/min — AB (ref >60.00)
Glucose, Bld: 137 mg/dL — ABNORMAL HIGH (ref 70–99)
PHOSPHORUS: 4.4 mg/dL (ref 2.3–4.6)
POTASSIUM: 4.8 meq/L (ref 3.5–5.1)
Sodium: 141 mEq/L (ref 135–145)

## 2017-09-14 ENCOUNTER — Encounter: Payer: Self-pay | Admitting: Family Medicine

## 2017-09-15 ENCOUNTER — Other Ambulatory Visit: Payer: Self-pay | Admitting: *Deleted

## 2017-09-15 ENCOUNTER — Telehealth: Payer: Self-pay | Admitting: Family Medicine

## 2017-09-15 DIAGNOSIS — N184 Chronic kidney disease, stage 4 (severe): Principal | ICD-10-CM

## 2017-09-15 DIAGNOSIS — E1122 Type 2 diabetes mellitus with diabetic chronic kidney disease: Secondary | ICD-10-CM

## 2017-09-15 MED ORDER — GLIPIZIDE 5 MG PO TABS: 5.0000 mg | ORAL_TABLET | Freq: Two times a day (BID) | ORAL | 3 refills | Status: DC

## 2017-09-15 MED ORDER — CARVEDILOL 6.25 MG PO TABS: 6.2500 mg | ORAL_TABLET | Freq: Two times a day (BID) | ORAL | 1 refills | Status: DC

## 2017-09-15 MED ORDER — DILTIAZEM HCL ER COATED BEADS 240 MG PO CP24: 240.0000 mg | ORAL_CAPSULE | Freq: Every day | ORAL | 1 refills | Status: DC

## 2017-09-15 NOTE — Telephone Encounter (Signed)
Tonya [name] notified as instructed by telephone.  She wants to know what Dr. Rometta Emery feelings are about the kidney specialist.  Please advise.

## 2017-09-15 NOTE — Telephone Encounter (Signed)
Given her level of concern about being off the medicaiton.. I will send in low dose glipizide ( 5 mg instead of her usual 2.5 mg) to take instead of the combination.

## 2017-09-15 NOTE — Telephone Encounter (Signed)
-----   Message from Carter Kitten, [place] sent at 09/15/2017 11:08 AM EST ----- Mrs. Foister notified as instructed by telephone.  She states she will do whatever Dr. Diona Browner recommends.  She is also concerned about being off her diabetes medication and doesn't know what to do about her diabetes.  Please advise.

## 2017-09-15 NOTE — Addendum Note (Signed)
Addended by: Carter Kitten on: 09/15/2017 04:32 PM   Modules accepted: Orders

## 2017-09-17 NOTE — Telephone Encounter (Signed)
Given it is not improving much .. I would recommend referral to nephrologist but can definitely wait until after the holidays.

## 2017-09-17 NOTE — Addendum Note (Signed)
Addended by: [name] E on: 09/17/2017 02:39 PM   Modules accepted: Orders

## 2017-09-17 NOTE — Telephone Encounter (Signed)
Tonya [name] notified as instructed by telephone.  She is agreeable to referral.

## 2017-09-24 ENCOUNTER — Other Ambulatory Visit: Payer: Self-pay | Admitting: Family Medicine

## 2017-09-24 DIAGNOSIS — Z1231 Encounter for screening mammogram for malignant neoplasm of breast: Secondary | ICD-10-CM

## 2017-10-01 ENCOUNTER — Other Ambulatory Visit: Payer: Self-pay | Admitting: *Deleted

## 2017-10-01 MED ORDER — CITALOPRAM HYDROBROMIDE 20 MG PO TABS: ORAL_TABLET | ORAL | 1 refills | Status: DC

## 2017-10-04 ENCOUNTER — Encounter: Payer: Self-pay | Admitting: Family Medicine

## 2017-10-20 ENCOUNTER — Other Ambulatory Visit: Payer: Self-pay

## 2017-10-21 ENCOUNTER — Other Ambulatory Visit: Payer: Self-pay

## 2017-10-21 MED ORDER — LOSARTAN POTASSIUM-HCTZ 100-25 MG PO TABS: 1.0000 | ORAL_TABLET | Freq: Every day | ORAL | 0 refills | Status: DC

## 2017-10-23 ENCOUNTER — Other Ambulatory Visit: Payer: Self-pay | Admitting: Family Medicine

## 2017-10-25 DIAGNOSIS — I10 Essential (primary) hypertension: Secondary | ICD-10-CM | POA: Diagnosis not present

## 2017-10-25 DIAGNOSIS — E1129 Type 2 diabetes mellitus with other diabetic kidney complication: Secondary | ICD-10-CM | POA: Diagnosis not present

## 2017-10-25 DIAGNOSIS — N183 Chronic kidney disease, stage 3 (moderate): Secondary | ICD-10-CM | POA: Diagnosis not present

## 2017-10-29 ENCOUNTER — Ambulatory Visit
Admission: RE | Admit: 2017-10-29 | Discharge: 2017-10-29 | Disposition: A | Discharge status: Home / Self Care | Payer: Medicare Other | Source: Ambulatory Visit | Attending: Family Medicine | Admitting: Family Medicine

## 2017-10-29 DIAGNOSIS — Z1231 Encounter for screening mammogram for malignant neoplasm of breast: Secondary | ICD-10-CM | POA: Diagnosis not present

## 2017-11-02 DIAGNOSIS — N183 Chronic kidney disease, stage 3 (moderate): Secondary | ICD-10-CM | POA: Diagnosis not present

## 2017-11-03 DIAGNOSIS — E1122 Type 2 diabetes mellitus with diabetic chronic kidney disease: Secondary | ICD-10-CM | POA: Diagnosis not present

## 2017-11-03 DIAGNOSIS — I10 Essential (primary) hypertension: Secondary | ICD-10-CM | POA: Diagnosis not present

## 2017-11-03 DIAGNOSIS — N183 Chronic kidney disease, stage 3 (moderate): Secondary | ICD-10-CM | POA: Diagnosis not present

## 2017-11-11 DIAGNOSIS — N183 Chronic kidney disease, stage 3 (moderate): Secondary | ICD-10-CM | POA: Diagnosis not present

## 2017-11-11 DIAGNOSIS — E1129 Type 2 diabetes mellitus with other diabetic kidney complication: Secondary | ICD-10-CM | POA: Diagnosis not present

## 2017-11-11 DIAGNOSIS — I10 Essential (primary) hypertension: Secondary | ICD-10-CM | POA: Diagnosis not present

## 2017-11-15 ENCOUNTER — Ambulatory Visit (INDEPENDENT_AMBULATORY_CARE_PROVIDER_SITE_OTHER): Payer: Medicare Other | Admitting: Physician Assistant

## 2017-11-15 ENCOUNTER — Encounter: Payer: Self-pay | Admitting: Physician Assistant

## 2017-11-15 VITALS — BP 128/62 | HR 61 | Ht 65.0 in | Wt 163.8 lb

## 2017-11-15 DIAGNOSIS — N184 Chronic kidney disease, stage 4 (severe): Secondary | ICD-10-CM

## 2017-11-15 DIAGNOSIS — E785 Hyperlipidemia, unspecified: Secondary | ICD-10-CM

## 2017-11-15 DIAGNOSIS — I10 Essential (primary) hypertension: Secondary | ICD-10-CM

## 2017-11-15 DIAGNOSIS — I2510 Atherosclerotic heart disease of native coronary artery without angina pectoris: Secondary | ICD-10-CM

## 2017-11-15 DIAGNOSIS — E1122 Type 2 diabetes mellitus with diabetic chronic kidney disease: Secondary | ICD-10-CM | POA: Diagnosis not present

## 2017-11-15 LAB — BASIC METABOLIC PANEL
BUN/Creatinine Ratio: 20 (ref 12–28)
BUN: 37 mg/dL — ABNORMAL HIGH (ref 8–27)
CALCIUM: 8.3 mg/dL — AB (ref 8.7–10.3)
CHLORIDE: 111 mmol/L — AB (ref 96–106)
CO2: 15 mmol/L — AB (ref 20–29)
Creatinine, Ser: 1.84 mg/dL — ABNORMAL HIGH (ref 0.57–1.00)
GFR calc Af Amer: 30 mL/min/{1.73_m2} — ABNORMAL LOW (ref >59)
GFR, EST NON AFRICAN AMERICAN: 26 mL/min/{1.73_m2} — AB (ref >59)
GLUCOSE: 114 mg/dL — AB (ref 65–99)
POTASSIUM: 5.3 mmol/L — AB (ref 3.5–5.2)
Sodium: 140 mmol/L (ref 134–144)

## 2017-11-15 MED ORDER — LOSARTAN POTASSIUM-HCTZ 100-25 MG PO TABS: 1.0000 | ORAL_TABLET | Freq: Every day | ORAL | 3 refills | Status: DC

## 2017-11-15 NOTE — Patient Instructions (Signed)
Medication Instructions: Your physician recommends that you continue on your current medications as directed. Please refer to the Current Medication list given to you today.  If you need a refill on your cardiac medications before your next appointment, please call your pharmacy.   Labwork: Your provider would like for you to have the following labs today: BMET   Follow-Up: Your physician wants you to follow-up in: 12 months with Dr. Percival Spanish. You will receive a reminder letter in the mail two months in advance. If you don't receive a letter, please call our office at [phone] to schedule this follow-up appointment.   Thank you for choosing [facility]!!

## 2017-11-15 NOTE — Progress Notes (Signed)
Cardiology Office Note   Date:  11/15/2017   ID:  Tonya [name], DOB Apr 11, 1941, MRN [id number]  PCP:  [name], MD  Cardiologist: Dr. Percival Spanish, 05/18/2016 Rosaria Ferries, PA-C   Chief Complaint  Patient presents with  . Follow-up    History of Present Illness: Tonya [name] is a 77 y.o. female with a history of BMS LAD 2007, 40% [place] and Diag at cath 2010 w/ patent stent, neg MV 2013, colon CA, anemia, anxiety, DM, HTN, HLD, GERD, IBS   Tonya [name] presents for cardiology follow up.  She is active helping to care for her grandson. She is able to lift him (wt about 25 lbs) without getting chest pain or SOB. She walks with her sister sometimes, is agreeable to doing that more. Does not feel limited by DOE, does get tired if she does too much.  No palpitations or heart skips. She had one episode of presyncope, related to taking glipizide and not eating.   No LE edema, no orthopnea or PND.  Does not feel limited by her breathing at all.  In general, she feels healthy and feels she is doing very well.  She is concerned that the losartan/HCTZ is contaminated because of commercial she has heard on TV.   Past Medical History:  Diagnosis Date  . Adenocarcinoma, colon ([place])   . Anemia    iron deficiency  . Anxiety   . Carpal tunnel syndrome   . Chronic diarrhea   . Colon polyps    Tubular adenoma  . Degenerative disk disease   . Depression   . Diabetes mellitus, type 2 ([place])   . Dysrhythmia    h/o AFIB  . Fall at home 03/16/2014   no fx; multiple facial lacerations, abrasions and hematoma  . GERD (gastroesophageal reflux disease)   . Headache   . Hyperlipidemia   . Hypertension   . IBS (irritable bowel syndrome)   . Osteoarthritis   . S/P coronary artery stent placement 2007  . Sciatica     Past Surgical History:  Procedure Laterality Date  . CARDIAC CATHETERIZATION  12/10   LAD stent patent. Insignificant CAD, otherwise EF 60-65%  . CARPAL TUNNEL  RELEASE    . CATARACT EXTRACTION  03/2015  . CHOLECYSTECTOMY    . DILATION AND CURETTAGE OF UTERUS    . HEMATOMA EVACUATION     after knee replacement  . KNEE ARTHROSCOPY     bilateral  . LUMBAR LAMINECTOMY N/A 11/26/2015   Procedure: LUMBAR DECOMPRESSIVE LAMINECTOMY L3-4 AND L4-5, LEFT L2-3 HEMILAMINECTOMY;  Surgeon: Jessy Oto, MD;  Location: [facility];  Service: Orthopedics;  Laterality: N/A;  . PARTIAL COLECTOMY  11/2008   right, for adenocarcinoma  . REPLACEMENT TOTAL KNEE  02/6293   right, complicated by hemarthrosis   . REVERSE SHOULDER ARTHROPLASTY Right 05/28/2016  . REVERSE SHOULDER ARTHROPLASTY Right 05/28/2016   Procedure: REVERSE SHOULDER ARTHROPLASTY;  Surgeon: Meredith Pel, MD;  Location: [facility];  Service: Orthopedics;  Laterality: Right;  . TONSILLECTOMY    . TOTAL KNEE ARTHROPLASTY Left 04/03/2014   dr whitfield  . TOTAL KNEE ARTHROPLASTY Left 04/03/2014   Procedure: TOTAL KNEE ARTHROPLASTY;  Surgeon: Garald Balding, MD;  Location: [facility];  Service: Orthopedics;  Laterality: Left;  . TOTAL KNEE REVISION  03/08/2012   Procedure: TOTAL KNEE REVISION;  Surgeon: Garald Balding, MD;  Location: [facility];  Service: Orthopedics;  Laterality: Right;  removal total knee hardware and  placement of antibiotic cement spacer and antibiotic beads  . TOTAL KNEE REVISION  05/17/2012   Procedure: TOTAL KNEE REVISION;  Surgeon: Garald Balding, MD;  Location: [facility];  Service: Orthopedics;  Laterality: Right;  right total knee revision, removal of antibiotic spacer    Current Outpatient Medications  Medication Sig Dispense Refill  . aspirin 81 MG tablet Take 81 mg by mouth daily.    Marland Kitchen atorvastatin (LIPITOR) 20 MG tablet TAKE 1 TABLET BY MOUTH DAILY. 90 tablet 3  . calcium-vitamin D (OSCAL WITH D 500-200) 500-200 MG-UNIT per tablet Take 1 tablet by mouth 2 (two) times daily.      . carvedilol (COREG) 6.25 MG tablet Take 1 tablet (6.25 mg total) by mouth 2 (two) times daily with a meal. 180  tablet 1  . cetirizine (ZYRTEC) 10 MG tablet Take 10 mg by mouth daily as needed for allergies.    . cholestyramine light (PREVALITE) 4 g packet TAKE 1 PACKET TWICE A DAY WITH A MEAL, TAKE AT LEAST 2 HOURS AWAY FROM OTHER MEDICATIONS. 180 packet 3  . citalopram (CELEXA) 20 MG tablet TAKE 1 TABLET (20 MG TOTAL) BY MOUTH DAILY. 90 tablet 1  . clindamycin (CLEOCIN) 300 MG capsule Take 2 capsules (600mg ) one hour prior to procedure 10 capsule 0  . diltiazem (CARDIZEM CD) 240 MG 24 hr capsule Take 1 capsule (240 mg total) by mouth daily. (Patient taking differently: Take 120 mg by mouth daily. ) 90 capsule 1  . ferrous sulfate 325 (65 FE) MG EC tablet Take 325 mg by mouth 2 (two) times daily.    Marland Kitchen glipiZIDE (GLUCOTROL) 5 MG tablet Take 1 tablet (5 mg total) by mouth 2 (two) times daily before a meal. 60 tablet 3  . losartan-hydrochlorothiazide (HYZAAR) 100-25 MG tablet Take 1 tablet by mouth daily. NEED OV. 15 tablet 0  . nystatin cream (MYCOSTATIN) Apply 1 application topically 2 (two) times daily as needed for dry skin. 30 g 1  . omeprazole (PRILOSEC) 20 MG capsule TAKE 2 CAPSULES BY MOUTH EVERY DAY 180 capsule 3  . spironolactone (ALDACTONE) 25 MG tablet TAKE 1 TABLET (25 MG TOTAL) BY MOUTH DAILY. 90 tablet 3  . Vitamin D, Ergocalciferol, (DRISDOL) 50000 units CAPS capsule Take 1 capsule (50,000 Units total) by mouth every 7 (seven) days. 12 capsule 0   No current facility-administered medications for this visit.     Allergies:   Amoxicillin-pot clavulanate    Social History:  The patient  reports that  has never smoked. she has never used smokeless tobacco. She reports that she does not drink alcohol or use drugs.   Family History:  The patient's family history includes Heart attack in her mother; Heart disease in her father and mother; Prostate cancer in her father.    ROS:  Please see the history of present illness. All other systems are reviewed and negative.    PHYSICAL EXAM: VS:  BP  128/62   Pulse 61   Ht 5\' 5"  (1.651 m)   Wt 163 lb 12.8 oz (74.3 kg)   BMI 27.26 kg/m  , BMI Body mass index is 27.26 kg/m. GEN: Well nourished, well developed, female in no acute distress  HEENT: normal for age  Neck: no JVD, no carotid bruit, no masses Cardiac: RRR; soft murmur, no rubs, or gallops Respiratory:  clear to auscultation bilaterally, normal work of breathing GI: soft, nontender, nondistended, + BS MS: no deformity or atrophy; no edema; distal pulses are  2+ in all 4 extremities   Skin: warm and dry, no rash Neuro:  Strength and sensation are intact Psych: euthymic mood, full affect   EKG:  EKG is ordered today. The ekg ordered today demonstrates sinus rhythm, heart rate 61, slightly low voltage and possible early re-pole are unchanged from 05/18/2016 ECG.  No Q waves no acute ischemic changes.  MYOVIEW: 02/09/2012 Impression Exercise Capacity:  [place] with no exercise. BP Response:  Normal blood pressure response. Clinical Symptoms:  Nausea  ECG Impression:  No significant ST segment change suggestive of ischemia. Comparison with Prior Nuclear Study: No images to compare Overall Impression:  Normal stress nuclear study.  There is breast attenuation that likely accounts for the mild fixed anteroseptal defect with normal motion.  LV Ejection Fraction: 71%.  LV Wall Motion:  NL LV Function; NL Wall Motion   Recent Labs: 02/10/2017: Hemoglobin 11.3; Platelets 243.0 08/26/2017: ALT 9 09/13/2017: BUN 33; Creatinine, Ser 1.81; Potassium 4.8; Sodium 141    Lipid Panel    Component Value Date/Time   CHOL 99 08/26/2017 0759   TRIG 96.0 08/26/2017 0759   TRIG 240 (HH) 09/07/2006 0849   HDL 36.70 (L) 08/26/2017 0759   CHOLHDL 3 08/26/2017 0759   VLDL 19.2 08/26/2017 0759   LDLCALC 43 08/26/2017 0759   LDLDIRECT 50.0 10/22/2014 0841     Wt Readings from Last 3 Encounters:  11/15/17 163 lb 12.8 oz (74.3 kg)  08/31/17 167 lb 4 oz (75.9 kg)  02/26/17 160 lb (72.6  kg)     Other studies Reviewed: Additional studies/ records that were reviewed today include: Office notes, hospital records and testing.  ASSESSMENT AND PLAN:  1.  CAD: She is on good therapy with aspirin, statin, beta-blocker, ARB.  Her blood pressure is well controlled and she is asymptomatic, no testing at this time.   2.  Hypertension: Her blood pressure is well controlled on current medications.  However, she is on losartan/HCTZ 100-25 mg and Spironolactone 25 mg daily.  Because of her renal function, these may need to be changed.  From a cardiac standpoint, the best new/additional medication for her would be hydralazine/nitrates.  I reassured her that the combination losartan/HCTZ is manufactured in the [place] and not a part of any recall.  3. CKD IV: She has not had labs done in the last month or 2.  I will check a BMET today.  If her renal function is worse than previous or her potassium level is higher than previous, will discontinue the Aldactone and/or the losartan HCTZ.  Continue to follow-up with the Nephrologist.   Current medicines are reviewed at length with the patient today.  The patient has concerns regarding medicines.  Concerns were addressed.   The following changes have been made:  no change  Labs/ tests ordered today include:   Orders Placed This Encounter  Procedures  . Basic metabolic panel  . EKG 12-Lead     Disposition:   FU with Dr. Percival Spanish  Signed, Rosaria Ferries, PA-C  11/15/2017 10:31 AM    [place] Phone: [phone]; Fax: [phone]  This note was written with the assistance of speech recognition software. Please excuse any transcriptional errors.

## 2017-11-18 ENCOUNTER — Telehealth: Payer: Self-pay | Admitting: Physician Assistant

## 2017-11-18 ENCOUNTER — Other Ambulatory Visit: Payer: Self-pay

## 2017-11-18 MED ORDER — ISOSORBIDE MONONITRATE ER 30 MG PO TB24: 30.0000 mg | ORAL_TABLET | Freq: Every day | ORAL | 3 refills | Status: DC

## 2017-11-18 MED ORDER — LOSARTAN POTASSIUM-HCTZ 100-25 MG PO TABS: 0.5000 | ORAL_TABLET | Freq: Every day | ORAL | 3 refills | Status: DC

## 2017-11-18 NOTE — Telephone Encounter (Signed)
New message  ° ° ° ° °Patient returning call for lab results  °

## 2017-11-23 ENCOUNTER — Ambulatory Visit (INDEPENDENT_AMBULATORY_CARE_PROVIDER_SITE_OTHER): Payer: Medicare Other | Admitting: Family Medicine

## 2017-11-23 ENCOUNTER — Other Ambulatory Visit: Payer: Self-pay

## 2017-11-23 ENCOUNTER — Encounter: Payer: Self-pay | Admitting: Family Medicine

## 2017-11-23 DIAGNOSIS — J309 Allergic rhinitis, unspecified: Secondary | ICD-10-CM | POA: Diagnosis not present

## 2017-11-23 DIAGNOSIS — I2510 Atherosclerotic heart disease of native coronary artery without angina pectoris: Secondary | ICD-10-CM

## 2017-11-23 NOTE — Patient Instructions (Signed)
Start back zyrtec at bedtime.  Start nasal saline spray or irrigation 2-3 times daily.  If not improving can try OTC flonase 2 sprays per nostril daily.  call if symptoms not improving as expected.

## 2017-11-23 NOTE — Progress Notes (Signed)
   Subjective:    Patient ID: Tonya [name], female    DOB: [birth year], 77 y.o.   MRN: [id number]  Cough  This is a new problem. The current episode started 1 to 4 weeks ago. The problem has been gradually worsening. The cough is productive of sputum. Associated symptoms include nasal congestion. Pertinent negatives include no chills, ear congestion, ear pain, fever, myalgias, sore throat or shortness of breath. Associated symptoms comments: Sinus pressure   Yellow mucus causing nausea  sneezing  fatigue. Risk factors: nonsmoker. Treatments tried: coricidin and mucinex. The treatment provided mild relief. Her past medical history is significant for environmental allergies. There is no history of asthma.   Not taking allergy meds.  Blood pressure 104/64, pulse 70, temperature 97.6 F (36.4 C), temperature source Oral, height 5' 5.5" (1.664 m), weight 161 lb 4 oz (73.1 kg), SpO2 97 %.    Review of Systems  Constitutional: Negative for chills and fever.  HENT: Negative for ear pain and sore throat.   Respiratory: Positive for cough. Negative for shortness of breath.   Musculoskeletal: Negative for myalgias.  Allergic/Immunologic: Positive for environmental allergies.       Objective:   Physical Exam  Constitutional: Vital signs are normal. She appears well-developed and well-nourished. She is cooperative.  Non-toxic appearance. She does not appear ill. No distress.  HENT:  Head: Normocephalic.  Right Ear: Hearing, tympanic membrane, external ear and ear canal normal. Tympanic membrane is not erythematous, not retracted and not bulging.  Left Ear: Hearing, tympanic membrane, external ear and ear canal normal. Tympanic membrane is not erythematous, not retracted and not bulging.  Nose: Mucosal edema and rhinorrhea present. Right sinus exhibits no maxillary sinus tenderness and no frontal sinus tenderness. Left sinus exhibits no maxillary sinus tenderness and no frontal sinus tenderness.    Mouth/Throat: Uvula is midline, oropharynx is clear and moist and mucous membranes are normal.  Eyes: Conjunctivae, EOM and lids are normal. Pupils are equal, round, and reactive to light. Lids are everted and swept, no foreign bodies found.  Neck: Trachea normal and normal range of motion. Neck supple. Carotid bruit is not present. No thyroid mass and no thyromegaly present.  Cardiovascular: Normal rate, regular rhythm, S1 normal, S2 normal, normal heart sounds, intact distal pulses and normal pulses. Exam reveals no gallop and no friction rub.  No murmur heard. Pulmonary/Chest: Effort normal and breath sounds normal. No tachypnea. No respiratory distress. She has no decreased breath sounds. She has no wheezes. She has no rhonchi. She has no rales.  Neurological: She is alert.  Skin: Skin is warm, dry and intact. No rash noted.  Psychiatric: Her speech is normal and behavior is normal. Judgment normal. Her mood appears not anxious. Cognition and memory are normal. She does not exhibit a depressed mood.          Assessment & Plan:

## 2017-12-02 DIAGNOSIS — N183 Chronic kidney disease, stage 3 (moderate): Secondary | ICD-10-CM | POA: Diagnosis not present

## 2017-12-02 DIAGNOSIS — I10 Essential (primary) hypertension: Secondary | ICD-10-CM | POA: Diagnosis not present

## 2017-12-02 DIAGNOSIS — E1129 Type 2 diabetes mellitus with other diabetic kidney complication: Secondary | ICD-10-CM | POA: Diagnosis not present

## 2017-12-06 ENCOUNTER — Other Ambulatory Visit: Payer: Self-pay | Admitting: *Deleted

## 2017-12-06 MED ORDER — GLIPIZIDE 5 MG PO TABS: 5.0000 mg | ORAL_TABLET | Freq: Two times a day (BID) | ORAL | 1 refills | Status: DC

## 2017-12-08 NOTE — Assessment & Plan Note (Signed)
Start back zyrtec at bedtime.  Start nasal saline spray or irrigation 2-3 times daily.  If not improving can try OTC flonase 2 sprays per nostril daily.

## 2017-12-17 ENCOUNTER — Encounter: Payer: Self-pay | Admitting: Gastroenterology

## 2017-12-28 ENCOUNTER — Encounter (INDEPENDENT_AMBULATORY_CARE_PROVIDER_SITE_OTHER): Payer: Self-pay | Admitting: Orthopaedic Surgery

## 2017-12-28 ENCOUNTER — Ambulatory Visit (INDEPENDENT_AMBULATORY_CARE_PROVIDER_SITE_OTHER): Payer: Medicare Other

## 2017-12-28 ENCOUNTER — Ambulatory Visit (INDEPENDENT_AMBULATORY_CARE_PROVIDER_SITE_OTHER): Payer: Medicare Other | Admitting: Orthopaedic Surgery

## 2017-12-28 VITALS — BP 120/75 | HR 91 | Resp 16 | Ht 65.0 in | Wt 161.5 lb

## 2017-12-28 DIAGNOSIS — M7062 Trochanteric bursitis, left hip: Secondary | ICD-10-CM | POA: Diagnosis not present

## 2017-12-28 DIAGNOSIS — I2510 Atherosclerotic heart disease of native coronary artery without angina pectoris: Secondary | ICD-10-CM

## 2017-12-28 DIAGNOSIS — Z96652 Presence of left artificial knee joint: Secondary | ICD-10-CM

## 2017-12-28 DIAGNOSIS — M25562 Pain in left knee: Secondary | ICD-10-CM | POA: Diagnosis not present

## 2017-12-28 MED ORDER — LIDOCAINE HCL 1 % IJ SOLN
2.0000 mL | INTRAMUSCULAR | Status: AC | PRN
  Administered 2017-12-28: 2 mL

## 2017-12-28 MED ORDER — METHYLPREDNISOLONE ACETATE 40 MG/ML IJ SUSP
80.0000 mg | INTRAMUSCULAR | Status: AC | PRN
  Administered 2017-12-28: 80 mg

## 2017-12-28 MED ORDER — BUPIVACAINE HCL 0.5 % IJ SOLN
2.0000 mL | INTRAMUSCULAR | Status: AC | PRN
  Administered 2017-12-28: 2 mL via INTRA_ARTICULAR

## 2017-12-28 NOTE — Progress Notes (Signed)
Office Visit Note   Patient: Tonya [name]           Date of Birth: Sep 04, 1941           MRN: [id number] Visit Date: 12/28/2017              Requested by: [name], MD [place], [place] [address] PCP: [name], MD   Assessment & Plan: Visit Diagnoses:  1. Trochanteric bursitis of left hip   2. Acute pain of left knee   3. Total knee replacement status, left     Plan: #1: At this time we have injected the trochanteric bursa of the left hip.  She was able now to at least externally rotate the hip to be able to get to her ankle to be able to put her shoes on.  This she says is improved. #2: She will return if she has problems.  Follow-Up Instructions: No follow-ups on file.   Orders:  Orders Placed This Encounter  Procedures  . XR KNEE 3 VIEW LEFT  . XR HIP UNILAT W OR W/O PELVIS 2-3 VIEWS LEFT   No orders of the defined types were placed in this encounter.     Procedures: Large Joint Inj: L greater trochanter on 12/28/2017 10:15 AM Indications: pain and diagnostic evaluation Details: 25 G 1.5 in needle, lateral approach  Arthrogram: No  Medications: 2 mL lidocaine 1 %; 2 mL bupivacaine 0.5 %; 80 mg methylPREDNISolone acetate 40 MG/ML Outcome: tolerated well, no immediate complications Procedure, treatment alternatives, risks and benefits explained, specific risks discussed. Consent was given by the patient. Immediately prior to procedure a time out was called to verify the correct patient, procedure, equipment, support staff and site/side marked as required. Patient was prepped and draped in the usual sterile fashion.       Clinical Data: No additional findings.   Subjective: Chief Complaint  Patient presents with  . Left Knee - Pain    L KNEE PAIN FOR 5 DAYS, NO INJURY, HAVING WEAKNESS AND NUMBNESS RADIATES FROM ANKLE TO HIP  . New Patient (Initial Visit)    L KNEE PAIN FOR 5 DAYS, NO INJURY, HAVING WEAKNESS AND NUMBNESS  RADIATES FROM ANKLE TO HIP    HPI  Tonya [name] is a very pleasant 77 year old white female who is seen today for evaluation of her left knee.  History is that she has pain along the lateral aspect of the knee whenever she tries to put her socks on when she gets her leg in a figure-of-four position.  This pain is more laterally at the knee.  She denies any recent history of injury or trauma.  She states this pain will radiate up into the hip area and occasionally down to the ankle.  Review of Systems  Constitutional: Negative for fatigue and fever.  HENT: Negative for ear pain.   Eyes: Negative for pain.  Respiratory: Negative for cough and shortness of breath.   Cardiovascular: Positive for leg swelling.  Gastrointestinal: Positive for diarrhea. Negative for blood in stool and constipation.  Genitourinary: Negative for difficulty urinating.  Musculoskeletal: Positive for back pain and neck pain.  Skin: Negative for rash and wound.  Allergic/Immunologic: Negative for food allergies.  Neurological: Positive for weakness and numbness. Negative for dizziness, light-headedness and headaches.  Hematological: Bruises/bleeds easily.  Psychiatric/Behavioral: Negative for sleep disturbance.     Objective: Vital Signs: BP 120/75 (BP Location: Left Arm, Patient Position: Sitting, Cuff Size: Normal)  Pulse 91   Resp 16   Ht 5\' 5"  (1.651 m)   Wt 161 lb 8 oz (73.3 kg)   BMI 26.88 kg/m   Physical Exam  Constitutional: She is oriented to person, place, and time. She appears well-developed and well-nourished.  HENT:  Head: Normocephalic and atraumatic.  Eyes: Pupils are equal, round, and reactive to light. EOM are normal.  Pulmonary/Chest: Effort normal.  Neurological: She is alert and oriented to person, place, and time.  Skin: Skin is warm and dry.  Psychiatric: She has a normal mood and affect. Her behavior is normal. Judgment and thought content normal.    Ortho Exam  Today she has a  tenderness to palpation more over the greater trochanteric region than over the left knee.  She does do a figure 4 position she does have pain in the left knee laterally.  No medial pain.  She has good motion of the left hip and the knee.  I can get 110 degrees of flexion easily on her knee.  She has good extension.  Good ligament stability of the left total knee replacement.  Well-healed surgical incision.  Specialty Comments:  No specialty comments available.  Imaging: Xr Hip Unilat W Or W/o Pelvis 2-3 Views Left  Result Date: 12/28/2017 2 view x-ray of the left hip and pelvis revealed some calcification over the lesser trochanter neck.  Lateral.  Does have some spurring superior femoral neck.  Flattening of the superior dome of the femoral head.  Degenerative changes in the lower lumbar spine with disc space narrowing L4 and L5-S1  Xr Knee 3 View Left  Result Date: 12/28/2017 Three-view x-ray of the left knee reveals excellent position alignment of the prosthesis.  She does have some lateral positioning of the patella on the sunrise view.    PMFS History: Current Outpatient Medications  Medication Sig Dispense Refill  . aspirin 81 MG tablet Take 81 mg by mouth daily.    Marland Kitchen atorvastatin (LIPITOR) 20 MG tablet TAKE 1 TABLET BY MOUTH DAILY. 90 tablet 3  . calcium-vitamin D (OSCAL WITH D 500-200) 500-200 MG-UNIT per tablet Take 1 tablet by mouth 2 (two) times daily.      . carvedilol (COREG) 6.25 MG tablet Take 1 tablet (6.25 mg total) by mouth 2 (two) times daily with a meal. 180 tablet 1  . cetirizine (ZYRTEC) 10 MG tablet Take 10 mg by mouth daily as needed for allergies.    . cholestyramine light (PREVALITE) 4 g packet TAKE 1 PACKET TWICE A DAY WITH A MEAL, TAKE AT LEAST 2 HOURS AWAY FROM OTHER MEDICATIONS. 180 packet 3  . citalopram (CELEXA) 20 MG tablet TAKE 1 TABLET (20 MG TOTAL) BY MOUTH DAILY. 90 tablet 1  . clindamycin (CLEOCIN) 300 MG capsule Take 2 capsules (600mg ) one hour prior to  procedure 10 capsule 0  . diltiazem (CARDIZEM SR) 120 MG 12 hr capsule Take 120 mg by mouth daily.    . ferrous sulfate 325 (65 FE) MG EC tablet Take 325 mg by mouth 2 (two) times daily.    . isosorbide mononitrate (IMDUR) 30 MG 24 hr tablet Take 1 tablet (30 mg total) by mouth daily. 30 tablet 3  . losartan-hydrochlorothiazide (HYZAAR) 100-25 MG tablet Take 0.5 tablets by mouth daily. 90 tablet 3  . omeprazole (PRILOSEC) 20 MG capsule TAKE 2 CAPSULES BY MOUTH EVERY DAY 180 capsule 3  . glipiZIDE (GLUCOTROL) 5 MG tablet Take 1 tablet (5 mg total) by mouth 2 (two)  times daily before a meal. (Patient not taking: Reported on 12/28/2017) 180 tablet 1  . nystatin cream (MYCOSTATIN) Apply 1 application topically 2 (two) times daily as needed for dry skin. (Patient not taking: Reported on 12/28/2017) 30 g 1   No current facility-administered medications for this visit.      Patient Active Problem List   Diagnosis Date Noted  . Metatarsalgia of right foot 08/31/2017  . History of total replacement of right shoulder joint 11/06/2016  . Anemia of chronic disease 11/29/2015  . Spinal stenosis, lumbar region, with neurogenic claudication 11/26/2015    Class: Chronic  . Mixed incontinence urge and stress 10/29/2015  . CKD stage 4 due to type 2 diabetes mellitus ([facility]) 06/25/2015  . Counseling regarding end of life decision making 10/26/2014  . S/P total knee replacement using cement 04/03/2014  . ATRIAL FIBRILLATION, PAROXYSMAL 10/17/2010  . Allergic rhinitis 01/23/2009  . ADENOCARCINOMA, COLON, CECUM 10/05/2008  . Coronary atherosclerosis 11/23/2007  . Hyperlipidemia 09/19/2007  . ANXIETY 09/19/2007  . Major depressive disorder, recurrent episode, moderate ([facility]) 09/19/2007  . GERD 09/19/2007  . DM due to underlying condition with diabetic nephropathy ([facility]) 03/21/2007  . SYNDROME, CARPAL TUNNEL 03/21/2007  . Essential hypertension, benign 03/21/2007  . IBS 03/21/2007  . Osteoarthrosis, unspecified  whether generalized or localized, unspecified site 03/21/2007   Past Medical History:  Diagnosis Date  . Adenocarcinoma, colon ([facility])   . Anemia    iron deficiency  . Anxiety   . Carpal tunnel syndrome   . Chronic diarrhea   . Colon polyps    Tubular adenoma  . Degenerative disk disease   . Depression   . Diabetes mellitus, type 2 ([facility])   . Dysrhythmia    h/o AFIB  . Fall at home 03/16/2014   no fx; multiple facial lacerations, abrasions and hematoma  . GERD (gastroesophageal reflux disease)   . Headache   . Hyperlipidemia   . Hypertension   . IBS (irritable bowel syndrome)   . Osteoarthritis   . S/P coronary artery stent placement 2007  . Sciatica     Family History  Problem Relation Age of Onset  . Heart disease Mother        massive MI age 34  . Heart attack Mother   . Heart disease Father        Massive MI age 57  . Prostate cancer Father   . Colon cancer Neg Hx     Past Surgical History:  Procedure Laterality Date  . CARDIAC CATHETERIZATION  12/10   LAD stent patent. Insignificant CAD, otherwise EF 60-65%  . CARPAL TUNNEL RELEASE    . CATARACT EXTRACTION  03/2015  . CHOLECYSTECTOMY    . DILATION AND CURETTAGE OF UTERUS    . HEMATOMA EVACUATION     after knee replacement  . KNEE ARTHROSCOPY     bilateral  . LUMBAR LAMINECTOMY N/A 11/26/2015   Procedure: LUMBAR DECOMPRESSIVE LAMINECTOMY L3-4 AND L4-5, LEFT L2-3 HEMILAMINECTOMY;  Surgeon: Jessy Oto, MD;  Location: [facility];  Service: Orthopedics;  Laterality: N/A;  . PARTIAL COLECTOMY  11/2008   right, for adenocarcinoma  . REPLACEMENT TOTAL KNEE  12/971   right, complicated by hemarthrosis   . REVERSE SHOULDER ARTHROPLASTY Right 05/28/2016  . REVERSE SHOULDER ARTHROPLASTY Right 05/28/2016   Procedure: REVERSE SHOULDER ARTHROPLASTY;  Surgeon: Meredith Pel, MD;  Location: [facility];  Service: Orthopedics;  Laterality: Right;  . TONSILLECTOMY    . TOTAL KNEE ARTHROPLASTY Left 04/03/2014  dr whitfield  .  TOTAL KNEE ARTHROPLASTY Left 04/03/2014   Procedure: TOTAL KNEE ARTHROPLASTY;  Surgeon: Garald Balding, MD;  Location: [facility];  Service: Orthopedics;  Laterality: Left;  . TOTAL KNEE REVISION  03/08/2012   Procedure: TOTAL KNEE REVISION;  Surgeon: Garald Balding, MD;  Location: [facility];  Service: Orthopedics;  Laterality: Right;  removal total knee hardware and placement of antibiotic cement spacer and antibiotic beads  . TOTAL KNEE REVISION  05/17/2012   Procedure: TOTAL KNEE REVISION;  Surgeon: Garald Balding, MD;  Location: [facility];  Service: Orthopedics;  Laterality: Right;  right total knee revision, removal of antibiotic spacer   Social History   Occupational History  . Occupation: retired    Fish farm manager: RETIRED  Tobacco Use  . Smoking status: Never Smoker  . Smokeless tobacco: Never Used  Substance and Sexual Activity  . Alcohol use: No    Alcohol/week: 0.0 oz  . Drug use: No  . Sexual activity: Never

## 2018-01-05 ENCOUNTER — Encounter (INDEPENDENT_AMBULATORY_CARE_PROVIDER_SITE_OTHER): Payer: Self-pay | Admitting: Orthopedic Surgery

## 2018-01-05 ENCOUNTER — Ambulatory Visit (INDEPENDENT_AMBULATORY_CARE_PROVIDER_SITE_OTHER): Payer: Medicare Other | Admitting: Orthopedic Surgery

## 2018-01-05 DIAGNOSIS — R21 Rash and other nonspecific skin eruption: Secondary | ICD-10-CM

## 2018-01-05 NOTE — Progress Notes (Signed)
Tonya [name] comes in today with a rash on her leg.  I injected her left trochanteric bursa on 2 April.  She states that that has been a big help.  However she developed this rash and apparently used and a drill to someone to given her.  Her complaints though unfortunately are the rashes on the opposite leg a lot of injected.  She is also having some diffuse pains in the lower extremities and at this time I have asked her to follow back up at the regular time for evaluation.  She make the appointment for tomorrow or Friday.  There is no charge for today.

## 2018-01-06 ENCOUNTER — Ambulatory Visit (INDEPENDENT_AMBULATORY_CARE_PROVIDER_SITE_OTHER): Payer: Medicare Other

## 2018-01-06 ENCOUNTER — Encounter (INDEPENDENT_AMBULATORY_CARE_PROVIDER_SITE_OTHER): Payer: Self-pay | Admitting: Orthopaedic Surgery

## 2018-01-06 ENCOUNTER — Ambulatory Visit (INDEPENDENT_AMBULATORY_CARE_PROVIDER_SITE_OTHER): Payer: Medicare Other | Admitting: Orthopaedic Surgery

## 2018-01-06 VITALS — BP 119/64 | HR 66 | Resp 16 | Ht 65.0 in | Wt 161.0 lb

## 2018-01-06 DIAGNOSIS — M899 Disorder of bone, unspecified: Secondary | ICD-10-CM

## 2018-01-06 DIAGNOSIS — I2510 Atherosclerotic heart disease of native coronary artery without angina pectoris: Secondary | ICD-10-CM | POA: Diagnosis not present

## 2018-01-06 DIAGNOSIS — M898X6 Other specified disorders of bone, lower leg: Secondary | ICD-10-CM

## 2018-01-06 DIAGNOSIS — S8011XA Contusion of right lower leg, initial encounter: Secondary | ICD-10-CM | POA: Diagnosis not present

## 2018-01-06 NOTE — Progress Notes (Signed)
Office Visit Note   Patient: Tonya [name]           Date of Birth: 1941-06-19           MRN: [id number] Visit Date: 01/06/2018              Requested by: [name], MD [place], [place] [address] PCP: [name], MD   Assessment & Plan: Visit Diagnoses:  1. Mass of tibia     Plan:  #1: She will monitor this area for worsening symptoms or swelling. 2: She will use ice to her left trochanteric region #3: If her symptoms return we could possibly do that of a LS spine x-ray.  Follow-Up Instructions: Return if symptoms worsen or fail to improve.   Orders:  Orders Placed This Encounter  Procedures  . XR Tibia/Fibula Right   No orders of the defined types were placed in this encounter.     Procedures: No procedures performed   Clinical Data: No additional findings.   Subjective: Chief Complaint  Patient presents with  . Left Leg - Pain  . Follow-up    LEFT LEG PAIN HAS RASH DOWN BACK OF BOTH LEGS    HPI  Tonya [name] was seen on 12/28/2017 for what appeared to be trochanteric bursitis of the left hip.  She had a corticosteroid injection at that time and she came "a whole lot better".  The next day on Friday she started having neuritis in the left leg with itching at the back of the ankle as well as the posterior aspect then of both lower extremities.  She obtained Benadryl gel and over the night it stopped itching.  Since that time she is had now some pruritus on the right posterior ankle which then also resolved but still has a rash.  Of interesting note she also has an area recently noted of what appears to be a mass that does have some mobility over the anterior medial aspect of the right distal tibia.  She states prior to that she also noted there was some ecchymosis type staining from there down to the posterior medial ankle and heel and now is just a small little area over the posterior medial calcaneus.  Asymptomatic now according to her  history.   Review of Systems  Constitutional: Positive for fatigue.  HENT: Negative for ear pain.   Eyes: Negative for pain.  Respiratory: Negative for cough and shortness of breath.   Cardiovascular: Positive for leg swelling.  Gastrointestinal: Positive for diarrhea. Negative for constipation.  Genitourinary: Negative for difficulty urinating.  Musculoskeletal: Positive for back pain and neck pain.  Skin: Positive for rash.  Allergic/Immunologic: Negative for food allergies.  Neurological: Negative for weakness and numbness.  Hematological: Bruises/bleeds easily.  Psychiatric/Behavioral: Negative for sleep disturbance.     Objective: Vital Signs: BP 119/64 (BP Location: Left Arm, Patient Position: Sitting, Cuff Size: Normal)   Pulse 66   Resp 16   Ht 5\' 5"  (1.651 m)   Wt 161 lb (73 kg)   BMI 26.79 kg/m   Physical Exam  Constitutional: She is oriented to person, place, and time. She appears well-developed and well-nourished.  HENT:  Head: Normocephalic and atraumatic.  Eyes: Pupils are equal, round, and reactive to light. EOM are normal.  Pulmonary/Chest: Effort normal.  Neurological: She is alert and oriented to person, place, and time.  Skin: Skin is warm and dry.  Psychiatric: She has a normal mood and  affect. Her behavior is normal. Judgment and thought content normal.    Ortho Exam  There is a small swelling which at this time is really not tender to palpation over the medial tibia about 6-8 cm above the ankle.  There appears to be some mild scant erythema about the posterior aspect of the calf at that area.  She does have some tenderness over the swelling.  There may be some residue of an ecchymosis medial calcaneal area.  Specialty Comments:  No specialty comments available.  Imaging: Xr Tibia/fibula Right  Result Date: 01/06/2018 2 view x-ray of the right tibia reveals the prosthesis at the knee in good position alignment.  Where the marker is I do not see  any periosteal elevation any signs of trauma.    PMFS History: Patient Active Problem List   Diagnosis Date Noted  . Metatarsalgia of right foot 08/31/2017  . History of total replacement of right shoulder joint 11/06/2016  . Anemia of chronic disease 11/29/2015  . Spinal stenosis, lumbar region, with neurogenic claudication 11/26/2015    Class: Chronic  . Mixed incontinence urge and stress 10/29/2015  . CKD stage 4 due to type 2 diabetes mellitus ([facility]) 06/25/2015  . Counseling regarding end of life decision making 10/26/2014  . S/P total knee replacement using cement 04/03/2014  . ATRIAL FIBRILLATION, PAROXYSMAL 10/17/2010  . Allergic rhinitis 01/23/2009  . ADENOCARCINOMA, COLON, CECUM 10/05/2008  . Coronary atherosclerosis 11/23/2007  . Hyperlipidemia 09/19/2007  . ANXIETY 09/19/2007  . Major depressive disorder, recurrent episode, moderate ([facility]) 09/19/2007  . GERD 09/19/2007  . DM due to underlying condition with diabetic nephropathy ([facility]) 03/21/2007  . SYNDROME, CARPAL TUNNEL 03/21/2007  . Essential hypertension, benign 03/21/2007  . IBS 03/21/2007  . Osteoarthrosis, unspecified whether generalized or localized, unspecified site 03/21/2007   Past Medical History:  Diagnosis Date  . Adenocarcinoma, colon ([facility])   . Anemia    iron deficiency  . Anxiety   . Carpal tunnel syndrome   . Chronic diarrhea   . Colon polyps    Tubular adenoma  . Degenerative disk disease   . Depression   . Diabetes mellitus, type 2 ([facility])   . Dysrhythmia    h/o AFIB  . Fall at home 03/16/2014   no fx; multiple facial lacerations, abrasions and hematoma  . GERD (gastroesophageal reflux disease)   . Headache   . Hyperlipidemia   . Hypertension   . IBS (irritable bowel syndrome)   . Osteoarthritis   . S/P coronary artery stent placement 2007  . Sciatica     Family History  Problem Relation Age of Onset  . Heart disease Mother        massive MI age 59  . Heart attack Mother   . Heart  disease Father        Massive MI age 75  . Prostate cancer Father   . Colon cancer Neg Hx     Past Surgical History:  Procedure Laterality Date  . CARDIAC CATHETERIZATION  12/10   LAD stent patent. Insignificant CAD, otherwise EF 60-65%  . CARPAL TUNNEL RELEASE    . CATARACT EXTRACTION  03/2015  . CHOLECYSTECTOMY    . DILATION AND CURETTAGE OF UTERUS    . HEMATOMA EVACUATION     after knee replacement  . KNEE ARTHROSCOPY     bilateral  . LUMBAR LAMINECTOMY N/A 11/26/2015   Procedure: LUMBAR DECOMPRESSIVE LAMINECTOMY L3-4 AND L4-5, LEFT L2-3 HEMILAMINECTOMY;  Surgeon: Jessy Oto, MD;  Location: [facility];  Service: Orthopedics;  Laterality: N/A;  . PARTIAL COLECTOMY  11/2008   right, for adenocarcinoma  . REPLACEMENT TOTAL KNEE  12/7827   right, complicated by hemarthrosis   . REVERSE SHOULDER ARTHROPLASTY Right 05/28/2016  . REVERSE SHOULDER ARTHROPLASTY Right 05/28/2016   Procedure: REVERSE SHOULDER ARTHROPLASTY;  Surgeon: Meredith Pel, MD;  Location: [facility];  Service: Orthopedics;  Laterality: Right;  . TONSILLECTOMY    . TOTAL KNEE ARTHROPLASTY Left 04/03/2014   dr whitfield  . TOTAL KNEE ARTHROPLASTY Left 04/03/2014   Procedure: TOTAL KNEE ARTHROPLASTY;  Surgeon: Garald Balding, MD;  Location: [facility];  Service: Orthopedics;  Laterality: Left;  . TOTAL KNEE REVISION  03/08/2012   Procedure: TOTAL KNEE REVISION;  Surgeon: Garald Balding, MD;  Location: [facility];  Service: Orthopedics;  Laterality: Right;  removal total knee hardware and placement of antibiotic cement spacer and antibiotic beads  . TOTAL KNEE REVISION  05/17/2012   Procedure: TOTAL KNEE REVISION;  Surgeon: Garald Balding, MD;  Location: [facility];  Service: Orthopedics;  Laterality: Right;  right total knee revision, removal of antibiotic spacer   Social History   Occupational History  . Occupation: retired    Fish farm manager: RETIRED  Tobacco Use  . Smoking status: Never Smoker  . Smokeless tobacco: Never Used    Substance and Sexual Activity  . Alcohol use: No    Alcohol/week: 0.0 oz  . Drug use: No  . Sexual activity: Never

## 2018-02-10 DIAGNOSIS — E1129 Type 2 diabetes mellitus with other diabetic kidney complication: Secondary | ICD-10-CM | POA: Diagnosis not present

## 2018-02-10 DIAGNOSIS — N183 Chronic kidney disease, stage 3 (moderate): Secondary | ICD-10-CM | POA: Diagnosis not present

## 2018-02-10 DIAGNOSIS — I129 Hypertensive chronic kidney disease with stage 1 through stage 4 chronic kidney disease, or unspecified chronic kidney disease: Secondary | ICD-10-CM | POA: Diagnosis not present

## 2018-02-14 ENCOUNTER — Other Ambulatory Visit: Payer: Self-pay | Admitting: Physician Assistant

## 2018-02-14 NOTE — Telephone Encounter (Signed)
Rx has been sent to the pharmacy electronically. ° °

## 2018-02-21 ENCOUNTER — Telehealth: Payer: Self-pay

## 2018-02-21 DIAGNOSIS — I10 Essential (primary) hypertension: Secondary | ICD-10-CM

## 2018-02-21 DIAGNOSIS — E1122 Type 2 diabetes mellitus with diabetic chronic kidney disease: Secondary | ICD-10-CM

## 2018-02-21 DIAGNOSIS — N184 Chronic kidney disease, stage 4 (severe): Secondary | ICD-10-CM

## 2018-02-21 DIAGNOSIS — E559 Vitamin D deficiency, unspecified: Secondary | ICD-10-CM

## 2018-02-21 DIAGNOSIS — E785 Hyperlipidemia, unspecified: Secondary | ICD-10-CM

## 2018-02-21 NOTE — Telephone Encounter (Signed)
CPE labs ordered and sent to PCP for approval. PCP please review lab orders and make modifications as necessary.

## 2018-02-22 ENCOUNTER — Other Ambulatory Visit (INDEPENDENT_AMBULATORY_CARE_PROVIDER_SITE_OTHER): Payer: Medicare Other

## 2018-02-22 ENCOUNTER — Ambulatory Visit: Payer: Medicare Other

## 2018-02-22 DIAGNOSIS — E1122 Type 2 diabetes mellitus with diabetic chronic kidney disease: Secondary | ICD-10-CM

## 2018-02-22 DIAGNOSIS — E785 Hyperlipidemia, unspecified: Secondary | ICD-10-CM | POA: Diagnosis not present

## 2018-02-22 DIAGNOSIS — N184 Chronic kidney disease, stage 4 (severe): Secondary | ICD-10-CM | POA: Diagnosis not present

## 2018-02-22 DIAGNOSIS — I10 Essential (primary) hypertension: Secondary | ICD-10-CM | POA: Diagnosis not present

## 2018-02-22 DIAGNOSIS — E559 Vitamin D deficiency, unspecified: Secondary | ICD-10-CM

## 2018-02-22 LAB — CBC WITH DIFFERENTIAL/PLATELET
BASOS ABS: 0.1 10*3/uL (ref 0.0–0.1)
Basophils Relative: 0.9 % (ref 0.0–3.0)
EOS ABS: 0.2 10*3/uL (ref 0.0–0.7)
Eosinophils Relative: 2.3 % (ref 0.0–5.0)
HCT: 33.9 % — ABNORMAL LOW (ref 36.0–46.0)
Hemoglobin: 11.6 g/dL — ABNORMAL LOW (ref 12.0–15.0)
LYMPHS ABS: 1.8 10*3/uL (ref 0.7–4.0)
Lymphocytes Relative: 22.6 % (ref 12.0–46.0)
MCHC: 34.4 g/dL (ref 30.0–36.0)
MCV: 89.3 fl (ref 78.0–100.0)
MONO ABS: 0.6 10*3/uL (ref 0.1–1.0)
MONOS PCT: 8 % (ref 3.0–12.0)
NEUTROS PCT: 66.2 % (ref 43.0–77.0)
Neutro Abs: 5.3 10*3/uL (ref 1.4–7.7)
PLATELETS: 189 10*3/uL (ref 150.0–400.0)
RBC: 3.79 Mil/uL — AB (ref 3.87–5.11)
RDW: 13.8 % (ref 11.5–15.5)
WBC: 8 10*3/uL (ref 4.0–10.5)

## 2018-02-22 LAB — COMPREHENSIVE METABOLIC PANEL
ALK PHOS: 68 U/L (ref 39–117)
ALT: 13 U/L (ref 0–35)
AST: 18 U/L (ref 0–37)
Albumin: 4 g/dL (ref 3.5–5.2)
BILIRUBIN TOTAL: 0.5 mg/dL (ref 0.2–1.2)
BUN: 32 mg/dL — ABNORMAL HIGH (ref 6–23)
CO2: 25 mEq/L (ref 19–32)
Calcium: 8.4 mg/dL (ref 8.4–10.5)
Chloride: 106 mEq/L (ref 96–112)
Creatinine, Ser: 1.6 mg/dL — ABNORMAL HIGH (ref 0.40–1.20)
GFR: 33.14 mL/min — ABNORMAL LOW (ref >60.00)
GLUCOSE: 133 mg/dL — AB (ref 70–99)
Potassium: 4 mEq/L (ref 3.5–5.1)
SODIUM: 140 meq/L (ref 135–145)
TOTAL PROTEIN: 6.7 g/dL (ref 6.0–8.3)

## 2018-02-22 LAB — LIPID PANEL
CHOL/HDL RATIO: 3
Cholesterol: 108 mg/dL (ref 0–200)
HDL: 40.4 mg/dL (ref >39.00)
LDL CALC: 30 mg/dL (ref 0–99)
NonHDL: 67.33
Triglycerides: 189 mg/dL — ABNORMAL HIGH (ref 0.0–149.0)
VLDL: 37.8 mg/dL (ref 0.0–40.0)

## 2018-02-22 LAB — VITAMIN D 25 HYDROXY (VIT D DEFICIENCY, FRACTURES): VITD: 22.42 ng/mL — AB (ref 30.00–100.00)

## 2018-02-22 LAB — HEMOGLOBIN A1C: Hgb A1c MFr Bld: 6.8 % — ABNORMAL HIGH (ref 4.6–6.5)

## 2018-02-28 ENCOUNTER — Encounter

## 2018-02-28 ENCOUNTER — Ambulatory Visit (INDEPENDENT_AMBULATORY_CARE_PROVIDER_SITE_OTHER): Payer: Medicare Other | Admitting: Gastroenterology

## 2018-02-28 ENCOUNTER — Encounter: Payer: Self-pay | Admitting: Gastroenterology

## 2018-02-28 ENCOUNTER — Other Ambulatory Visit: Payer: Self-pay | Admitting: *Deleted

## 2018-02-28 VITALS — BP 142/72 | HR 64 | Ht 65.25 in | Wt 165.0 lb

## 2018-02-28 DIAGNOSIS — Z85038 Personal history of other malignant neoplasm of large intestine: Secondary | ICD-10-CM | POA: Diagnosis not present

## 2018-02-28 DIAGNOSIS — I2510 Atherosclerotic heart disease of native coronary artery without angina pectoris: Secondary | ICD-10-CM | POA: Diagnosis not present

## 2018-02-28 MED ORDER — PEG 3350-KCL-NA BICARB-NACL 420 G PO SOLR: 4000.0000 mL | ORAL | 0 refills | Status: DC

## 2018-02-28 NOTE — Progress Notes (Signed)
Review of pertinent gastrointestinal problems: 1. Colon cancer: early stage synchronous right-sided colon cancers diagnosed and resected in March 2010. She did not require any adjuvant chemotherapy. She has had serial interval follow-up and last colonoscopy was done in March 2014. She had 3 sessile polyps removed which were tubular adenomas and she was recommended for 5 year interval follow-up. 2. Chronic loose stools since cancer surgery; responded well to cholestyramine    HPI: This is a very pleasant 77 year old woman whom I last saw about 2 years ago  Chief complaint is personal history of colon cancer, colon polyps   I last saw her about 2 years ago.  At that time she was having her usual chronic loose stools that seem to continue to respond to cholestyramine.  She had lost about 30 pounds unintentionally however now is a bit concerned about cancer recurrence given her history and so I arrange for CT scan abdomen and pelvis.  That scan showed no explanation for her weight loss and I have recommended that she have colonoscopy, 2017.  She has gained 11 pounds since that last OV.  prevalite continue to work for  Her bowels.  No overt bleeding.    She takes 1 dose once daily and stirs it just the right number of times and it seems to work very well  ROS: complete GI ROS as described in HPI, all other review negative.  Constitutional:  No unintentional weight loss   Past Medical History:  Diagnosis Date  . Adenocarcinoma, colon ([place])   . Anemia    iron deficiency  . Anxiety   . Carpal tunnel syndrome   . Chronic diarrhea   . Colon polyps    Tubular adenoma  . Degenerative disk disease   . Depression   . Diabetes mellitus, type 2 ([place])   . Dysrhythmia    h/o AFIB  . Fall at home 03/16/2014   no fx; multiple facial lacerations, abrasions and hematoma  . GERD (gastroesophageal reflux disease)   . Headache   . Hyperlipidemia   . Hypertension   . IBS (irritable bowel  syndrome)   . Osteoarthritis   . S/P coronary artery stent placement 2007  . Sciatica     Past Surgical History:  Procedure Laterality Date  . CARDIAC CATHETERIZATION  12/10   LAD stent patent. Insignificant CAD, otherwise EF 60-65%  . CARPAL TUNNEL RELEASE    . CATARACT EXTRACTION  03/2015  . CHOLECYSTECTOMY    . DILATION AND CURETTAGE OF UTERUS    . HEMATOMA EVACUATION     after knee replacement  . KNEE ARTHROSCOPY     bilateral  . LUMBAR LAMINECTOMY N/A 11/26/2015   Procedure: LUMBAR DECOMPRESSIVE LAMINECTOMY L3-4 AND L4-5, LEFT L2-3 HEMILAMINECTOMY;  Surgeon: Jessy Oto, MD;  Location: [facility];  Service: Orthopedics;  Laterality: N/A;  . PARTIAL COLECTOMY  11/2008   right, for adenocarcinoma  . REPLACEMENT TOTAL KNEE  02/3015   right, complicated by hemarthrosis   . REVERSE SHOULDER ARTHROPLASTY Right 05/28/2016  . REVERSE SHOULDER ARTHROPLASTY Right 05/28/2016   Procedure: REVERSE SHOULDER ARTHROPLASTY;  Surgeon: Meredith Pel, MD;  Location: [facility];  Service: Orthopedics;  Laterality: Right;  . TONSILLECTOMY    . TOTAL KNEE ARTHROPLASTY Left 04/03/2014   dr whitfield  . TOTAL KNEE ARTHROPLASTY Left 04/03/2014   Procedure: TOTAL KNEE ARTHROPLASTY;  Surgeon: Garald Balding, MD;  Location: [facility];  Service: Orthopedics;  Laterality: Left;  . TOTAL KNEE REVISION  03/08/2012  Procedure: TOTAL KNEE REVISION;  Surgeon: Garald Balding, MD;  Location: [facility];  Service: Orthopedics;  Laterality: Right;  removal total knee hardware and placement of antibiotic cement spacer and antibiotic beads  . TOTAL KNEE REVISION  05/17/2012   Procedure: TOTAL KNEE REVISION;  Surgeon: Garald Balding, MD;  Location: [facility];  Service: Orthopedics;  Laterality: Right;  right total knee revision, removal of antibiotic spacer    Current Outpatient Medications  Medication Sig Dispense Refill  . aspirin 81 MG tablet Take 81 mg by mouth daily.    Marland Kitchen atorvastatin (LIPITOR) 20 MG tablet TAKE 1 TABLET  BY MOUTH DAILY. 90 tablet 3  . calcium-vitamin D (OSCAL WITH D 500-200) 500-200 MG-UNIT per tablet Take 1 tablet by mouth 2 (two) times daily.      . carvedilol (COREG) 6.25 MG tablet Take 1 tablet (6.25 mg total) by mouth 2 (two) times daily with a meal. 180 tablet 1  . cetirizine (ZYRTEC) 10 MG tablet Take 10 mg by mouth daily as needed for allergies.    . cholestyramine light (PREVALITE) 4 g packet TAKE 1 PACKET TWICE A DAY WITH A MEAL, TAKE AT LEAST 2 HOURS AWAY FROM OTHER MEDICATIONS. 180 packet 3  . citalopram (CELEXA) 20 MG tablet TAKE 1 TABLET (20 MG TOTAL) BY MOUTH DAILY. 90 tablet 1  . diltiazem (CARDIZEM SR) 120 MG 12 hr capsule Take 120 mg by mouth daily.    . ferrous sulfate 325 (65 FE) MG EC tablet Take 325 mg by mouth 2 (two) times daily.    Marland Kitchen glipiZIDE (GLUCOTROL) 5 MG tablet Take 1 tablet (5 mg total) by mouth 2 (two) times daily before a meal. 180 tablet 1  . isosorbide mononitrate (IMDUR) 30 MG 24 hr tablet TAKE 1 TABLET BY MOUTH EVERY DAY 90 tablet 1  . losartan-hydrochlorothiazide (HYZAAR) 100-25 MG tablet Take 0.5 tablets by mouth daily. 90 tablet 3  . nystatin cream (MYCOSTATIN) Apply 1 application topically 2 (two) times daily as needed for dry skin. 30 g 1  . omeprazole (PRILOSEC) 20 MG capsule TAKE 2 CAPSULES BY MOUTH EVERY DAY 180 capsule 3  . clindamycin (CLEOCIN) 300 MG capsule Take 2 capsules (600mg ) one hour prior to procedure (Patient not taking: Reported on 01/06/2018) 10 capsule 0   No current facility-administered medications for this visit.     Allergies as of 02/28/2018 - Review Complete 02/28/2018  Allergen Reaction Noted  . Amoxicillin-pot clavulanate Swelling and Other (See Comments)     Family History  Problem Relation Age of Onset  . Heart disease Mother        massive MI age 74  . Heart attack Mother   . Heart disease Father        Massive MI age 40  . Prostate cancer Father   . Colon cancer Neg Hx     Social History   Socioeconomic  History  . Marital status: Divorced    Spouse name: Not on file  . Number of children: 3  . Years of education: Not on file  . Highest education level: Not on file  Occupational History  . Occupation: retired    Fish farm manager: RETIRED  Social Needs  . Financial resource strain: Not on file  . Food insecurity:    Worry: Not on file    Inability: Not on file  . Transportation needs:    Medical: Not on file    Non-medical: Not on file  Tobacco Use  .  Smoking status: Never Smoker  . Smokeless tobacco: Never Used  Substance and Sexual Activity  . Alcohol use: No    Alcohol/week: 0.0 oz  . Drug use: No  . Sexual activity: Never  Lifestyle  . Physical activity:    Days per week: Not on file    Minutes per session: Not on file  . Stress: Not on file  Relationships  . Social connections:    Talks on phone: Not on file    Gets together: Not on file    Attends religious service: Not on file    Active member of club or organization: Not on file    Attends meetings of clubs or organizations: Not on file    Relationship status: Not on file  . Intimate partner violence:    Fear of current or ex partner: Not on file    Emotionally abused: Not on file    Physically abused: Not on file    Forced sexual activity: Not on file  Other Topics Concern  . Not on file  Social History Narrative   No regular exercise, limited due to knees   Diet- addicted to sweets, some fruit and veggies, water daily.     Physical Exam: BP (!) 142/72 (BP Location: Left Arm, Patient Position: Sitting, Cuff Size: Normal)   Pulse 64   Ht 5' 5.25" (1.657 m)   Wt 165 lb (74.8 kg)   BMI 27.25 kg/m  Constitutional: generally well-appearing Psychiatric: alert and oriented x3 Abdomen: soft, nontender, nondistended, no obvious ascites, no peritoneal signs, normal bowel sounds No peripheral edema noted in lower extremities  Assessment and plan: 77 y.o. female with personal history of colon cancer, colon  polyps  Her last surveillance, screening examination was about 5 years ago and I recommended we repeat that for her now.  She takes Prevalite daily for alternating bowel habits that are likely related to her right hemicolectomy, loss of IC valve.  She is happy with the regimen she is on.  I see no reason for any further blood tests or imaging studies.  She is traveling over the next month or so and wants to wait until after that before has the colonoscopy.  Please see the "Patient Instructions" section for addition details about the plan.  Owens Loffler, MD [facility] 02/28/2018, 8:28 AM

## 2018-02-28 NOTE — Patient Instructions (Addendum)
You will be set up for a colonoscopy for personal history of colon cancer (July or August  Normal BMI (Body Mass Index- based on height and weight) is between 23 and 30. Your BMI today is Body mass index is 27.25 kg/m. Marland Kitchen Please consider follow up  regarding your BMI with your Primary Care Provider.

## 2018-03-01 ENCOUNTER — Other Ambulatory Visit: Payer: Self-pay

## 2018-03-01 ENCOUNTER — Encounter: Payer: Self-pay | Admitting: Family Medicine

## 2018-03-01 ENCOUNTER — Ambulatory Visit (INDEPENDENT_AMBULATORY_CARE_PROVIDER_SITE_OTHER): Payer: Medicare Other | Admitting: Family Medicine

## 2018-03-01 VITALS — BP 139/64 | HR 62 | Temp 98.4°F | Ht 65.25 in | Wt 165.5 lb

## 2018-03-01 DIAGNOSIS — N184 Chronic kidney disease, stage 4 (severe): Secondary | ICD-10-CM

## 2018-03-01 DIAGNOSIS — G8929 Other chronic pain: Secondary | ICD-10-CM

## 2018-03-01 DIAGNOSIS — I480 Paroxysmal atrial fibrillation: Secondary | ICD-10-CM | POA: Diagnosis not present

## 2018-03-01 DIAGNOSIS — M79671 Pain in right foot: Secondary | ICD-10-CM | POA: Diagnosis not present

## 2018-03-01 DIAGNOSIS — E782 Mixed hyperlipidemia: Secondary | ICD-10-CM

## 2018-03-01 DIAGNOSIS — E1122 Type 2 diabetes mellitus with diabetic chronic kidney disease: Secondary | ICD-10-CM | POA: Diagnosis not present

## 2018-03-01 DIAGNOSIS — D692 Other nonthrombocytopenic purpura: Secondary | ICD-10-CM | POA: Insufficient documentation

## 2018-03-01 DIAGNOSIS — I10 Essential (primary) hypertension: Secondary | ICD-10-CM | POA: Diagnosis not present

## 2018-03-01 DIAGNOSIS — Z0000 Encounter for general adult medical examination without abnormal findings: Secondary | ICD-10-CM

## 2018-03-01 DIAGNOSIS — F331 Major depressive disorder, recurrent, moderate: Secondary | ICD-10-CM | POA: Diagnosis not present

## 2018-03-01 LAB — HM DIABETES FOOT EXAM

## 2018-03-01 NOTE — Patient Instructions (Addendum)
To not forget the glipizide.. Try taking with lunchtime meal instead of breakfast. Please stop at the front desk to set up referral.

## 2018-03-01 NOTE — Assessment & Plan Note (Signed)
At goal on current regimen.

## 2018-03-01 NOTE — Assessment & Plan Note (Signed)
Well controlled. Continue current medication.  

## 2018-03-01 NOTE — Assessment & Plan Note (Signed)
Due to ASA and thin skin, pt reassured

## 2018-03-01 NOTE — Progress Notes (Signed)
Subjective:    Patient ID: Tonya [name], female    DOB: [birth year], 77 y.o.   MRN: [id number]  HPI   The patient presents for annual medicare wellness, complete physical and review of chronic health problems. He/She also has the following acute concerns today:  Hypertension: BP at goal on diltiazem, coreg, losartan HCTZ, isosorbide   BP Readings from Last 3 Encounters:  03/01/18 139/64  02/28/18 (!) 142/72  01/06/18 119/64  Using medication without problems or lightheadedness:  none Chest pain with exertion:none Edema: yes Short of breath: none Average home BPs: Other issues: Paroxsysmal afib and CAD: followed by CARDs. Rate controlled and on ASA as anticoagulant  Diabetes:   Good control  But  Worsened from lat checklately forgetting glipizide Lab Results  Component Value Date   HGBA1C 6.8 (H) 02/22/2018  Using medications without difficulties: Hypoglycemic episodes: Hyperglycemic episodes: Feet problems: Blood Sugars averaging: 130 eye exam within last year:  Elevated Cholesterol:  LDL at goal  < 70 on statin. Lab Results  Component Value Date   CHOL 108 02/22/2018   HDL 40.40 02/22/2018   LDLCALC 30 02/22/2018   LDLDIRECT 50.0 10/22/2014   TRIG 189.0 (H) 02/22/2018   CHOLHDL 3 02/22/2018  Using medications without problems:none Muscle aches: none Diet compliance: moderate Exercise: walking some Other complaints:   CKD due to DM and HTN: stable control Cr at baseline 1.6-1.8  Followed by Dr. Candiss Norse  MDD: Stable control on celexa   Advance directives and end of life planning reviewed in detail with patient and documented in EMR. Patient given handout on advance care directives if needed. HCPOA and living will updated if needed. Fall Risk  03/01/2018 02/10/2017 10/29/2015 10/26/2014 10/20/2013  Falls in the past year? Yes Yes No Yes Yes  Comment - pt fell while playing soccer and broke right arm; medical treatment including surgery - - -  Number falls in past  yr: 1 1 - 1 1  Injury with Fall? - Yes - - Yes  Comment - - - - Shoulder Pain  Risk Factor Category  - - - High Fall Risk -  Risk for fall due to : - - - History of fall(s) History of fall(s)    Hearing Screening   Method: Audiometry   125Hz  250Hz  500Hz  1000Hz  2000Hz  3000Hz  4000Hz  6000Hz  8000Hz   Right ear:   20 20 20  20     Left ear:   20 20 20   0    Vision Screening Comments: Eye Exam with Dr. Katy Fitch 11/2017  Depression screen [facility] 2/9 03/01/2018 02/26/2017 02/10/2017  Decreased Interest 0 0 0  Down, Depressed, Hopeless 0 0 0  PHQ - 2 Score 0 0 0  Altered sleeping - - -  Tired, decreased energy - - -  Change in appetite - - -  Feeling bad or failure about yourself  - - -  Trouble concentrating - - -  Moving slowly or fidgety/restless - - -  Suicidal thoughts - - -  PHQ-9 Score - - -  Some recent data might be hidden   Social History /Family History/Past Medical History reviewed in detail and updated in EMR if needed. Blood pressure 139/64, pulse 62, temperature 98.4 F (36.9 C), temperature source Oral, height 5' 5.25" (1.657 m), weight 165 lb 8 oz (75.1 kg).   Review of Systems  Constitutional: Negative for fatigue and fever.  HENT: Negative for congestion.   Eyes: Negative for pain.  Respiratory: Negative for cough and  shortness of breath.   Cardiovascular: Negative for chest pain, palpitations and leg swelling.  Gastrointestinal: Negative for abdominal pain.  Genitourinary: Negative for dysuria and vaginal bleeding.  Musculoskeletal: Negative for back pain.  Skin:       Has noted red, purple bruises on arms where no known injury was, no pain  Neurological: Negative for syncope, light-headedness and headaches.  Psychiatric/Behavioral: Negative for dysphoric mood.       Objective:   Physical Exam  Constitutional: Vital signs are normal. She appears well-developed and well-nourished. She is cooperative.  Non-toxic appearance. She does not appear ill. No distress.  HENT:    Head: Normocephalic.  Right Ear: Hearing, tympanic membrane, external ear and ear canal normal.  Left Ear: Hearing, tympanic membrane, external ear and ear canal normal.  Nose: Nose normal.  Eyes: Pupils are equal, round, and reactive to light. Conjunctivae, EOM and lids are normal. Lids are everted and swept, no foreign bodies found.  Neck: Trachea normal and normal range of motion. Neck supple. Carotid bruit is not present. No thyroid mass and no thyromegaly present.  Cardiovascular: Normal rate, regular rhythm, S1 normal, S2 normal, normal heart sounds and intact distal pulses. Exam reveals no gallop.  No murmur heard. Pulmonary/Chest: Effort normal and breath sounds normal. No respiratory distress. She has no wheezes. She has no rhonchi. She has no rales.  Abdominal: Soft. Normal appearance and bowel sounds are normal. She exhibits no distension, no fluid wave, no abdominal bruit and no mass. There is no hepatosplenomegaly. There is no tenderness. There is no rebound, no guarding and no CVA tenderness. No hernia.  Lymphadenopathy:    She has no cervical adenopathy.    She has no axillary adenopathy.  Neurological: She is alert. She has normal strength. No cranial nerve deficit or sensory deficit.  Skin: Skin is warm, dry and intact. No rash noted.  Thin skin, senile purpura on arms  Psychiatric: Her speech is normal and behavior is normal. Judgment normal. Her mood appears not anxious. Cognition and memory are normal. She does not exhibit a depressed mood.      Diabetic foot exam: Normal inspection except on right mid foot, sore and swollen? Cyst.. Metatarsal pads did not help No skin breakdown Pre-ulcerative calluses  Normal DP pulses Normal sensation to light touch and monofilament Nails normal     Assessment & Plan:  The patient's preventative maintenance and recommended screening tests for an annual wellness exam were reviewed in full today. Brought up to date unless  services declined.  Counselled on the importance of diet, exercise, and its role in overall health and mortality. The patient's FH and SH was reviewed, including their home life, tobacco status, and drug and alcohol status.   DEXA 10/2014 normal Repeat in 5 years, 2021  Mammogram nml in 09/2017 Uptodate with colonoscopy with hx of colon cancer.. Last 11/2012, repeat in 5 years. (plan repeat 05/18/2018) Reviewed appt with Rande Brunt PAP/DVE not indicated at this age. No vaginal bleeding.  Nonsmoker  Vaccine: uptodate except for shingles

## 2018-03-01 NOTE — Assessment & Plan Note (Signed)
LDL at goal < 70 on statin. 

## 2018-03-01 NOTE — Assessment & Plan Note (Signed)
Refer to foot doctor.

## 2018-03-14 ENCOUNTER — Encounter: Payer: Self-pay | Admitting: Family Medicine

## 2018-03-14 DIAGNOSIS — E1121 Type 2 diabetes mellitus with diabetic nephropathy: Secondary | ICD-10-CM

## 2018-03-14 MED ORDER — GLUCOSE BLOOD VI STRP: ORAL_STRIP | 3 refills | Status: DC

## 2018-03-14 MED ORDER — ACCU-CHEK AVIVA PLUS W/DEVICE KIT: PACK | 0 refills | Status: DC

## 2018-03-23 ENCOUNTER — Encounter: Payer: Self-pay | Admitting: Podiatry

## 2018-03-23 ENCOUNTER — Ambulatory Visit (INDEPENDENT_AMBULATORY_CARE_PROVIDER_SITE_OTHER): Payer: Medicare Other | Admitting: Podiatry

## 2018-03-23 ENCOUNTER — Ambulatory Visit (INDEPENDENT_AMBULATORY_CARE_PROVIDER_SITE_OTHER): Payer: Medicare Other

## 2018-03-23 ENCOUNTER — Other Ambulatory Visit: Payer: Self-pay | Admitting: Family Medicine

## 2018-03-23 ENCOUNTER — Other Ambulatory Visit: Payer: Self-pay | Admitting: Podiatry

## 2018-03-23 VITALS — BP 125/68 | HR 65

## 2018-03-23 DIAGNOSIS — M2041 Other hammer toe(s) (acquired), right foot: Secondary | ICD-10-CM

## 2018-03-23 DIAGNOSIS — M779 Enthesopathy, unspecified: Secondary | ICD-10-CM | POA: Diagnosis not present

## 2018-03-23 DIAGNOSIS — M79671 Pain in right foot: Secondary | ICD-10-CM

## 2018-03-23 DIAGNOSIS — I2510 Atherosclerotic heart disease of native coronary artery without angina pectoris: Secondary | ICD-10-CM | POA: Diagnosis not present

## 2018-03-23 MED ORDER — TRIAMCINOLONE ACETONIDE 10 MG/ML IJ SUSP
10.0000 mg | Freq: Once | INTRAMUSCULAR | Status: AC
  Administered 2018-03-23: 10 mg

## 2018-03-24 NOTE — Progress Notes (Signed)
Subjective:   Patient ID: Tonya [name], female   DOB: 77 y.o.   MRN: [id number]   HPI Patient presents stating she is developed a lot of pain in her forefoot right going on around a year and has worsened over the last couple months.  States it is very painful and she is not walking on the outside of the foot and gradually having more issues with it and patient does not smoke and likes to be active   Review of Systems  All other systems reviewed and are negative.       Objective:  Physical Exam  Constitutional: She appears well-developed and well-nourished.  Cardiovascular: Intact distal pulses.  Pulmonary/Chest: Effort normal.  Musculoskeletal: Normal range of motion.  Neurological: She is alert.  Skin: Skin is warm.  Nursing note and vitals reviewed.   Neurovascular status intact muscle strength adequate range of motion within normal limits with patient noted to have inflammation fluid and pain of the second metatarsal phalangeal joint right with mild to moderate elevation of the second toe with keratotic lesion on the head of the proximal phalanx.  This has occurred over the last year and patient was noted to have good digital perfusion and is well oriented x3     Assessment:  Inflammatory capsulitis second MPJ right with probability for flexor plate disruption and digital deformity     Plan:  H&P condition reviewed and proximal nerve block administered.  I explained the risk of this procedure in today I did a aspiration of the joint getting out a small amount of clear fluid and injected with 1/4 cc dexamethasone Kenalog and applied thick padding to reduce pressure on the joint surface.  Patient will be seen back for [place] to recheck again in the next several weeks and may ultimately require digital fusion and shortening osteotomy  X-ray indicates that there is no indications that there is stress fracture or arthritic event occurring but there is elevation of the second toe

## 2018-03-27 ENCOUNTER — Other Ambulatory Visit: Payer: Self-pay | Admitting: Family Medicine

## 2018-04-06 ENCOUNTER — Ambulatory Visit (INDEPENDENT_AMBULATORY_CARE_PROVIDER_SITE_OTHER): Payer: Medicare Other | Admitting: Podiatry

## 2018-04-06 ENCOUNTER — Encounter: Payer: Self-pay | Admitting: Podiatry

## 2018-04-06 DIAGNOSIS — M79671 Pain in right foot: Secondary | ICD-10-CM

## 2018-04-06 DIAGNOSIS — M779 Enthesopathy, unspecified: Secondary | ICD-10-CM | POA: Diagnosis not present

## 2018-04-06 DIAGNOSIS — M2041 Other hammer toe(s) (acquired), right foot: Secondary | ICD-10-CM | POA: Diagnosis not present

## 2018-04-06 DIAGNOSIS — I2510 Atherosclerotic heart disease of native coronary artery without angina pectoris: Secondary | ICD-10-CM | POA: Diagnosis not present

## 2018-04-06 NOTE — Progress Notes (Signed)
Subjective:   Patient ID: Tonya [name], female   DOB: 77 y.o.   MRN: [id number]   HPI Patient states I seem to be doing pretty well with minimal discomfort and wearing the     ROS      Objective:  Physical Exam  Neurovascular status intact with muscle strength found to be adequate with patient's right foot doing well with inflammation still noted around the second MPJ but improved from previous visit with pain only upon deep palpation.  Patient is noted to have padding in the proper position which seems to also be helping and is wearing a relatively rigid bottom shoe     Assessment:  Structural deformity with chronic inflammatory capsulitis which is improved but still present     Plan:  H&P condition reviewed and I recommended padding the area to disperse the weight off of this joint along with consideration of orthotics and the fact that digital correction may be necessary which I educated her on today  X-ray indicates that she does have digital deformity and I reviewed the fact with her today

## 2018-04-10 ENCOUNTER — Other Ambulatory Visit: Payer: Self-pay | Admitting: Family Medicine

## 2018-04-11 NOTE — Telephone Encounter (Signed)
Electronic refill request Last office visit 03/01/18 Last refill 10/01/17 #90/1 See drug warning with Omeprazole

## 2018-04-26 ENCOUNTER — Encounter: Payer: Medicare Other | Admitting: Gastroenterology

## 2018-04-27 ENCOUNTER — Encounter: Payer: Self-pay | Admitting: Gastroenterology

## 2018-05-18 ENCOUNTER — Encounter: Payer: Medicare Other | Admitting: Gastroenterology

## 2018-05-23 ENCOUNTER — Other Ambulatory Visit: Payer: Self-pay | Admitting: Cardiology

## 2018-05-28 DIAGNOSIS — Z23 Encounter for immunization: Secondary | ICD-10-CM | POA: Diagnosis not present

## 2018-06-15 DIAGNOSIS — I129 Hypertensive chronic kidney disease with stage 1 through stage 4 chronic kidney disease, or unspecified chronic kidney disease: Secondary | ICD-10-CM | POA: Diagnosis not present

## 2018-06-15 DIAGNOSIS — N183 Chronic kidney disease, stage 3 (moderate): Secondary | ICD-10-CM | POA: Diagnosis not present

## 2018-06-15 DIAGNOSIS — E1129 Type 2 diabetes mellitus with other diabetic kidney complication: Secondary | ICD-10-CM | POA: Diagnosis not present

## 2018-07-19 ENCOUNTER — Ambulatory Visit (AMBULATORY_SURGERY_CENTER): Payer: Medicare Other | Admitting: Gastroenterology

## 2018-07-19 ENCOUNTER — Encounter: Payer: Self-pay | Admitting: Gastroenterology

## 2018-07-19 VITALS — BP 121/58 | HR 57 | Temp 98.6°F | Resp 12 | Ht 65.25 in | Wt 165.0 lb

## 2018-07-19 DIAGNOSIS — Z85038 Personal history of other malignant neoplasm of large intestine: Secondary | ICD-10-CM

## 2018-07-19 DIAGNOSIS — D123 Benign neoplasm of transverse colon: Secondary | ICD-10-CM | POA: Diagnosis not present

## 2018-07-19 DIAGNOSIS — D125 Benign neoplasm of sigmoid colon: Secondary | ICD-10-CM

## 2018-07-19 DIAGNOSIS — K5730 Diverticulosis of large intestine without perforation or abscess without bleeding: Secondary | ICD-10-CM

## 2018-07-19 MED ORDER — SODIUM CHLORIDE 0.9 % IV SOLN: 500.0000 mL | Freq: Once | INTRAVENOUS | Status: DC

## 2018-07-19 NOTE — Progress Notes (Signed)
Spontaneous respirations throughout. VSS. Resting comfortably. To PACU on room air. Report to  RN. 

## 2018-07-19 NOTE — Op Note (Signed)
[facility] Patient Name: Tonya [name] Procedure Date: 07/19/2018 10:25 AM MRN: [id number] Endoscopist: Milus Banister , MD Age: 77 Referring MD:  Date of Birth: 02-17-1941 Gender: Female Account #: [id number] Procedure:                Colonoscopy Indications:              High risk colon cancer surveillance: Personal                            history of colon cancer; Colon cancer:early stage                            synchronous right-sided colon cancers diagnosed and                            resected in March 2010. She did not require any                            adjuvant chemotherapy. She has had serial interval                            follow-up and last colonoscopy was done in March                            2014. She had 3 sessile polyps removed which were                            tubular adenomas Medicines:                Monitored Anesthesia Care Procedure:                Pre-Anesthesia Assessment:                           - Prior to the procedure, a History and Physical                            was performed, and patient medications and                            allergies were reviewed. The patient's tolerance of                            previous anesthesia was also reviewed. The risks                            and benefits of the procedure and the sedation                            options and risks were discussed with the patient.                            All questions were answered, and informed consent  was obtained. Prior Anticoagulants: The patient has                            taken no previous anticoagulant or antiplatelet                            agents. ASA Grade Assessment: II - A patient with                            mild systemic disease. After reviewing the risks                            and benefits, the patient was deemed in                            satisfactory condition to undergo the  procedure.                           After obtaining informed consent, the colonoscope                            was passed under direct vision. Throughout the                            procedure, the patient's blood pressure, pulse, and                            oxygen saturations were monitored continuously. The                            Model PCF-[id number]) scope was introduced                            through the anus and advanced to the the                            ileocolonic anastomosis. The colonoscopy was                            performed without difficulty. The patient tolerated                            the procedure well. The quality of the bowel                            preparation was good. The rectum was photographed. Scope In: 10:27:52 AM Scope Out: 10:50:27 AM Scope Withdrawal Time: 0 hours 18 minutes 22 seconds  Total Procedure Duration: 0 hours 22 minutes 35 seconds  Findings:                 Right sided ileocolonic anastomosis was normal                            appearing.  Four sessile polyps were found in the sigmoid colon                            and transverse colon. The polyps were 4 to 8 mm in                            size. These polyps were removed with a cold snare.                            Resection and retrieval were complete.                           Two sessile polyps were found in the sigmoid colon                            and transverse colon. The polyps were 10 mm in                            size. These polyps were removed with a hot snare.                            Resection and retrieval were complete.                           Multiple small and large-mouthed diverticula were                            found in the left colon.                           The exam was otherwise without abnormality on                            direct and retroflexion views. Complications:            No  immediate complications. Estimated blood loss:                            None. Estimated Blood Loss:     Estimated blood loss: none. Impression:               - Right sided ileocolonic anastomosis was normal                            appearing.                           - Four 4 to 8 mm polyps in the sigmoid colon and in                            the transverse colon, removed with a cold snare.                            Resected and retrieved.                           -  Two 10 mm polyps in the sigmoid colon and in the                            transverse colon, removed with a hot snare.                            Resected and retrieved.                           - Diverticulosis in the left colon.                           - The examination was otherwise normal on direct                            and retroflexion views. Recommendation:           - Patient has a contact number available for                            emergencies. The signs and symptoms of potential                            delayed complications were discussed with the                            patient. Return to normal activities tomorrow.                            Written discharge instructions were provided to the                            patient.                           - Resume previous diet.                           - Continue present medications.                           - Await final path results for final                            recommendations. Milus Banister, MD 07/19/2018 10:55:04 AM This report has been signed electronically.

## 2018-07-19 NOTE — Progress Notes (Signed)
Called to room to assist during endoscopic procedure.  Patient ID and intended procedure confirmed with present staff. Received instructions for my participation in the procedure from the performing physician.  

## 2018-07-19 NOTE — Patient Instructions (Signed)
Continue present medications. Please read handouts on Polyps and Diverticulosis provided.    YOU HAD AN ENDOSCOPIC PROCEDURE TODAY AT [facility]:   Refer to the procedure report that was given to you for any specific questions about what was found during the examination.  If the procedure report does not answer your questions, please call your gastroenterologist to clarify.  If you requested that your care partner not be given the details of your procedure findings, then the procedure report has been included in a sealed envelope for you to review at your convenience later.  YOU SHOULD EXPECT: Some feelings of bloating in the abdomen. Passage of more gas than usual.  Walking can help get rid of the air that was put into your GI tract during the procedure and reduce the bloating. If you had a lower endoscopy (such as a colonoscopy or flexible sigmoidoscopy) you may notice spotting of blood in your stool or on the toilet paper. If you underwent a bowel prep for your procedure, you may not have a normal bowel movement for a few days.  Please Note:  You might notice some irritation and congestion in your nose or some drainage.  This is from the oxygen used during your procedure.  There is no need for concern and it should clear up in a day or so.  SYMPTOMS TO REPORT IMMEDIATELY:   Following lower endoscopy (colonoscopy or flexible sigmoidoscopy):  Excessive amounts of blood in the stool  Significant tenderness or worsening of abdominal pains  Swelling of the abdomen that is new, acute  Fever of 100F or higher    For urgent or emergent issues, a gastroenterologist can be reached at any hour by calling [phone].   DIET:  We do recommend a small meal at first, but then you may proceed to your regular diet.  Drink plenty of fluids but you should avoid alcoholic beverages for 24 hours.  ACTIVITY:  You should plan to take it easy for the rest of today and you should NOT  DRIVE or use heavy machinery until tomorrow (because of the sedation medicines used during the test).    FOLLOW UP: Our staff will call the number listed on your records the next business day following your procedure to check on you and address any questions or concerns that you may have regarding the information given to you following your procedure. If we do not reach you, we will leave a message.  However, if you are feeling well and you are not experiencing any problems, there is no need to return our call.  We will assume that you have returned to your regular daily activities without incident.  If any biopsies were taken you will be contacted by phone or by letter within the next 1-3 weeks.  Please call us at [phone] if you have not heard about the biopsies in 3 weeks.    SIGNATURES/CONFIDENTIALITY: You and/or your care partner have signed paperwork which will be entered into your electronic medical record.  These signatures attest to the fact that that the information above on your After Visit Summary has been reviewed and is understood.  Full responsibility of the confidentiality of this discharge information lies with you and/or your care-partner.

## 2018-07-20 ENCOUNTER — Telehealth: Payer: Self-pay

## 2018-07-20 NOTE — Telephone Encounter (Signed)
  Follow up Call-  Call back number 07/19/2018  Post procedure Call Back phone  # [phone]  Permission to leave phone message Yes  Some recent data might be hidden     Patient questions:  Do you have a fever, pain , or abdominal swelling? No. Pain Score  0 *  Have you tolerated food without any problems? Yes.    Have you been able to return to your normal activities? Yes.    Do you have any questions about your discharge instructions: Diet   No. Medications  No. Follow up visit  No.  Do you have questions or concerns about your Care? No.  Actions: * If pain score is 4 or above: No action needed, pain <4.

## 2018-07-24 ENCOUNTER — Encounter: Payer: Self-pay | Admitting: Gastroenterology

## 2018-08-23 ENCOUNTER — Other Ambulatory Visit (INDEPENDENT_AMBULATORY_CARE_PROVIDER_SITE_OTHER): Payer: Medicare Other

## 2018-08-23 ENCOUNTER — Telehealth: Payer: Self-pay | Admitting: Family Medicine

## 2018-08-23 ENCOUNTER — Other Ambulatory Visit: Payer: Medicare Other

## 2018-08-23 DIAGNOSIS — D638 Anemia in other chronic diseases classified elsewhere: Secondary | ICD-10-CM

## 2018-08-23 DIAGNOSIS — E1121 Type 2 diabetes mellitus with diabetic nephropathy: Secondary | ICD-10-CM | POA: Diagnosis not present

## 2018-08-23 LAB — COMPREHENSIVE METABOLIC PANEL
ALT: 14 U/L (ref 0–35)
AST: 19 U/L (ref 0–37)
Albumin: 4.4 g/dL (ref 3.5–5.2)
Alkaline Phosphatase: 74 U/L (ref 39–117)
BILIRUBIN TOTAL: 0.8 mg/dL (ref 0.2–1.2)
BUN: 32 mg/dL — ABNORMAL HIGH (ref 6–23)
CO2: 26 meq/L (ref 19–32)
CREATININE: 1.55 mg/dL — AB (ref 0.40–1.20)
Calcium: 8.8 mg/dL (ref 8.4–10.5)
Chloride: 106 mEq/L (ref 96–112)
GFR: 34.33 mL/min — ABNORMAL LOW (ref >60.00)
Glucose, Bld: 149 mg/dL — ABNORMAL HIGH (ref 70–99)
Potassium: 3.5 mEq/L (ref 3.5–5.1)
SODIUM: 143 meq/L (ref 135–145)
Total Protein: 6.7 g/dL (ref 6.0–8.3)

## 2018-08-23 LAB — LIPID PANEL
CHOL/HDL RATIO: 3
Cholesterol: 105 mg/dL (ref 0–200)
HDL: 38.4 mg/dL — ABNORMAL LOW (ref >39.00)
LDL Cholesterol: 27 mg/dL (ref 0–99)
NONHDL: 66.55
Triglycerides: 199 mg/dL — ABNORMAL HIGH (ref 0.0–149.0)
VLDL: 39.8 mg/dL (ref 0.0–40.0)

## 2018-08-23 LAB — HEMOGLOBIN A1C: Hgb A1c MFr Bld: 6.4 % (ref 4.6–6.5)

## 2018-08-23 NOTE — Telephone Encounter (Signed)
-----   Message from Lendon Collar, RT sent at 08/15/2018  9:49 AM EST ----- Regarding: Lab orders for Tuesday 08/23/18 Please enter 64month follow up lab orders for 08/23/18. Thanks!

## 2018-09-02 ENCOUNTER — Ambulatory Visit: Payer: Medicare Other | Admitting: Family Medicine

## 2018-09-06 ENCOUNTER — Encounter: Payer: Self-pay | Admitting: Family Medicine

## 2018-09-06 ENCOUNTER — Ambulatory Visit (INDEPENDENT_AMBULATORY_CARE_PROVIDER_SITE_OTHER): Payer: Medicare Other | Admitting: Family Medicine

## 2018-09-06 VITALS — BP 118/72 | HR 112 | Temp 98.4°F | Ht 65.25 in | Wt 161.8 lb

## 2018-09-06 DIAGNOSIS — I480 Paroxysmal atrial fibrillation: Secondary | ICD-10-CM

## 2018-09-06 DIAGNOSIS — I2510 Atherosclerotic heart disease of native coronary artery without angina pectoris: Secondary | ICD-10-CM

## 2018-09-06 DIAGNOSIS — N184 Chronic kidney disease, stage 4 (severe): Secondary | ICD-10-CM | POA: Diagnosis not present

## 2018-09-06 DIAGNOSIS — E782 Mixed hyperlipidemia: Secondary | ICD-10-CM

## 2018-09-06 DIAGNOSIS — I499 Cardiac arrhythmia, unspecified: Secondary | ICD-10-CM | POA: Diagnosis not present

## 2018-09-06 DIAGNOSIS — E1122 Type 2 diabetes mellitus with diabetic chronic kidney disease: Secondary | ICD-10-CM

## 2018-09-06 DIAGNOSIS — E1121 Type 2 diabetes mellitus with diabetic nephropathy: Secondary | ICD-10-CM | POA: Diagnosis not present

## 2018-09-06 DIAGNOSIS — I10 Essential (primary) hypertension: Secondary | ICD-10-CM | POA: Diagnosis not present

## 2018-09-06 MED ORDER — APIXABAN 5 MG PO TABS: 5.0000 mg | ORAL_TABLET | Freq: Two times a day (BID) | ORAL | 11 refills | Status: DC

## 2018-09-06 MED ORDER — CARVEDILOL 12.5 MG PO TABS: 6.2500 mg | ORAL_TABLET | Freq: Two times a day (BID) | ORAL | 11 refills | Status: DC

## 2018-09-06 NOTE — Assessment & Plan Note (Signed)
Good control on current regimen.. Watch closely with increase in coreg.

## 2018-09-06 NOTE — Assessment & Plan Note (Signed)
Stable

## 2018-09-06 NOTE — Assessment & Plan Note (Signed)
Well controlled. Continue current medication. On statin. 

## 2018-09-06 NOTE — Patient Instructions (Addendum)
Ask the eye doctor to send a copy of diabetic exam.  Get back to walking as able.  Stop Aspirin.  Start Eliqus 5 mg twice daily.  Increase coreg to 12.5 mg twice daily  Dr. Rosezella [place] office will call you for follow up.

## 2018-09-06 NOTE — Assessment & Plan Note (Signed)
Adequate control for age on low dose glipizide... No hypoglycemia on lower dose.

## 2018-09-06 NOTE — Progress Notes (Signed)
Subjective:    Patient ID: Tonya [name], female    DOB: [birth year], 77 y.o.   MRN: [id number]  HPI   77 year old female presents for 6 month follow up DM  Diabetes:   At goal for age on glipizide alone using 1/2 tab of 5 mg daily Lab Results  Component Value Date   HGBA1C 6.4 08/23/2018  Using medications without difficulties: Hypoglycemic episodes: NONE Hyperglycemic episodes: none Feet problems: no ulcers Blood Sugars averaging: FBS  eye exam within last year: this week. CKD due to DM and HTN: GFR stable at 34 followed by renal.  Elevated Cholesterol:  LDL at goal <70 given DM and CVD on statin. Lab Results  Component Value Date   CHOL 105 08/23/2018   HDL 38.40 (L) 08/23/2018   LDLCALC 27 08/23/2018   LDLDIRECT 50.0 10/22/2014   TRIG 199.0 (H) 08/23/2018   CHOLHDL 3 08/23/2018  Using medications without problems: Muscle aches:  Diet compliance: Exercise: Other complaints:  Hypertension:    On losartan, HCTZ, imdur, diltiazem, coreg.   HR elevated today in office... Has not missed medication. BP Readings from Last 3 Encounters:  07/19/18 (!) 121/58  03/23/18 125/68  03/01/18 139/64  Using medication without problems or lightheadedness:  none Chest pain with exertion:none Edema:none Short of breath:none Average home BPs: stable Other issues:  NEW ONSET FATIGUE IN LAST 1-2 WEEKS  She had blamed her busy schedule    She was not aware of diagnosis  of paroxsysmal afib (cannot find clearly when this history was added to her problem list.. Looks like 2012 when in treatment for colon cancer . But not noted in cardiology follow up. HR today high... EKG showed  AFib, new onset but HR 111. Pt on aspirin.   Has appt with cardiology in 10/2017 with Dr. Riccardo Dubin score 7  HASBLED 2  Social History /Family History/Past Medical History reviewed in detail and updated in EMR if needed.  Blood pressure 118/72, pulse (!) 112, temperature 98.4 F (36.9 C),  temperature source Oral, height 5' 5.25" (1.657 m), weight 161 lb 12 oz (73.4 kg).    Review of Systems  Constitutional: Negative for fatigue and fever.  HENT: Negative for congestion.   Eyes: Negative for pain.  Respiratory: Negative for cough and shortness of breath.   Cardiovascular: Negative for chest pain, palpitations and leg swelling.  Gastrointestinal: Negative for abdominal pain.  Genitourinary: Negative for dysuria and vaginal bleeding.  Musculoskeletal: Negative for back pain.  Neurological: Negative for syncope, light-headedness and headaches.  Psychiatric/Behavioral: Negative for dysphoric mood.       Objective:   Physical Exam  Constitutional: Vital signs are normal. She appears well-developed and well-nourished. She is cooperative.  Non-toxic appearance. She does not appear ill. No distress.  HENT:  Head: Normocephalic.  Right Ear: Hearing, tympanic membrane, external ear and ear canal normal. Tympanic membrane is not erythematous, not retracted and not bulging.  Left Ear: Hearing, tympanic membrane, external ear and ear canal normal. Tympanic membrane is not erythematous, not retracted and not bulging.  Nose: No mucosal edema or rhinorrhea. Right sinus exhibits no maxillary sinus tenderness and no frontal sinus tenderness. Left sinus exhibits no maxillary sinus tenderness and no frontal sinus tenderness.  Mouth/Throat: Uvula is midline, oropharynx is clear and moist and mucous membranes are normal.  Eyes: Pupils are equal, round, and reactive to light. Conjunctivae, EOM and lids are normal. Lids are everted and swept, no foreign bodies found.  Neck: Trachea normal and normal range of motion. Neck supple. Carotid bruit is not present. No thyroid mass and no thyromegaly present.  Cardiovascular: S1 normal, S2 normal, normal heart sounds, intact distal pulses and normal pulses. An irregularly irregular rhythm present. Tachycardia present. Exam reveals no gallop and no  friction rub.  No murmur heard. Pulmonary/Chest: Effort normal and breath sounds normal. No tachypnea. No respiratory distress. She has no decreased breath sounds. She has no wheezes. She has no rhonchi. She has no rales.  Abdominal: Soft. Normal appearance and bowel sounds are normal. There is no tenderness.  Neurological: She is alert.  Skin: Skin is warm, dry and intact. No rash noted.  Psychiatric: Her speech is normal and behavior is normal. Judgment and thought content normal. Her mood appears not anxious. Cognition and memory are normal. She does not exhibit a depressed mood.          Assessment & Plan:

## 2018-09-06 NOTE — Assessment & Plan Note (Addendum)
New onset likely cause of fatigue ( previous paroxsysmal afib in prob list but pt unaware).  Discussed care with  Dr. Percival Spanish cardiology. CHADs2VASC2 score 7  HASBLED 2  Stop aspirin. Start eliquis.  Plan likely cardioversion in 3 weeks after anticoagulation.   For rate control... Will increase her coreg, follow BPs closely.

## 2018-09-08 ENCOUNTER — Ambulatory Visit (INDEPENDENT_AMBULATORY_CARE_PROVIDER_SITE_OTHER): Payer: Medicare Other | Admitting: Cardiology

## 2018-09-08 ENCOUNTER — Encounter: Payer: Self-pay | Admitting: Cardiology

## 2018-09-08 VITALS — BP 122/78 | HR 97 | Ht 65.25 in | Wt 165.0 lb

## 2018-09-08 DIAGNOSIS — R5383 Other fatigue: Secondary | ICD-10-CM | POA: Diagnosis not present

## 2018-09-08 DIAGNOSIS — H04123 Dry eye syndrome of bilateral lacrimal glands: Secondary | ICD-10-CM | POA: Diagnosis not present

## 2018-09-08 DIAGNOSIS — H40013 Open angle with borderline findings, low risk, bilateral: Secondary | ICD-10-CM | POA: Diagnosis not present

## 2018-09-08 DIAGNOSIS — I2510 Atherosclerotic heart disease of native coronary artery without angina pectoris: Secondary | ICD-10-CM | POA: Diagnosis not present

## 2018-09-08 DIAGNOSIS — Z961 Presence of intraocular lens: Secondary | ICD-10-CM | POA: Diagnosis not present

## 2018-09-08 DIAGNOSIS — H02834 Dermatochalasis of left upper eyelid: Secondary | ICD-10-CM | POA: Diagnosis not present

## 2018-09-08 DIAGNOSIS — I480 Paroxysmal atrial fibrillation: Secondary | ICD-10-CM | POA: Diagnosis not present

## 2018-09-08 DIAGNOSIS — H43812 Vitreous degeneration, left eye: Secondary | ICD-10-CM | POA: Diagnosis not present

## 2018-09-08 DIAGNOSIS — E119 Type 2 diabetes mellitus without complications: Secondary | ICD-10-CM | POA: Diagnosis not present

## 2018-09-08 DIAGNOSIS — H02831 Dermatochalasis of right upper eyelid: Secondary | ICD-10-CM | POA: Diagnosis not present

## 2018-09-08 NOTE — Progress Notes (Signed)
Cardiology Office Note   Date:  09/08/2018   ID:  Tonya [name], DOB 1941/07/18, MRN [id number]  PCP:  Tonya Sanders, MD  Cardiologist:   No primary care provider on file. Referring:  Tonya Sanders, MD  No chief complaint on file.     History of Present Illness: Tonya [name] is a 77 y.o. female who presents for evaluation of atrial fibrillation.  This was noticed recently by Tonya Sanders, MD.  I discussed this with her on the phone the other day and we started the patient on anticoagulation.  She now presents for follow-up.  I have not seen her in a couple of years although she was seen earlier in this clinic by Tonya [name], PAc.  The patient does have a history of coronary disease as described below.  She does not notice the fibrillation.  She does not feel any palpitations.  She has no chest pressure, neck or arm discomfort.  She has no any presyncope or syncope.  She has no shortness of breath, PND or orthopnea.  She is had no weight gain or edema.   Past Medical History:  Diagnosis Date  . Adenocarcinoma, colon (Tonya [name])   . Anemia    iron deficiency  . Anxiety   . Blood transfusion without reported diagnosis   . Carpal tunnel syndrome   . Cataract    REMOVED BILATERAL  . Chronic diarrhea   . Colon polyps    Tubular adenoma  . Degenerative disk disease   . Depression   . Diabetes mellitus, type 2 (Tonya [name])   . Dysrhythmia    h/o AFIB  . Fall at home 03/16/2014   no fx; multiple facial lacerations, abrasions and hematoma  . GERD (gastroesophageal reflux disease)   . Headache   . Heart murmur    AS A CHILD  . Hyperlipidemia   . Hypertension   . IBS (irritable bowel syndrome)   . Osteoarthritis   . Osteoporosis   . S/P coronary artery stent placement 2007  . Sciatica     Past Surgical History:  Procedure Laterality Date  . CARDIAC CATHETERIZATION  12/10   LAD stent patent. Insignificant CAD, otherwise EF 60-65%  . CARPAL TUNNEL RELEASE    . CATARACT  EXTRACTION  03/2015  . CHOLECYSTECTOMY    . COLON SURGERY    . COLONOSCOPY    . DILATION AND CURETTAGE OF UTERUS    . HEMATOMA EVACUATION     after knee replacement  . KNEE ARTHROSCOPY     bilateral  . LUMBAR LAMINECTOMY N/A 11/26/2015   Procedure: LUMBAR DECOMPRESSIVE LAMINECTOMY L3-4 AND L4-5, LEFT L2-3 HEMILAMINECTOMY;  Surgeon: Jessy Oto, MD;  Location: [place];  Service: Orthopedics;  Laterality: N/A;  . PARTIAL COLECTOMY  11/2008   right, for adenocarcinoma  . REPLACEMENT TOTAL KNEE  12/6501   right, complicated by hemarthrosis   . REVERSE SHOULDER ARTHROPLASTY Right 05/28/2016  . REVERSE SHOULDER ARTHROPLASTY Right 05/28/2016   Procedure: REVERSE SHOULDER ARTHROPLASTY;  Surgeon: Meredith Pel, MD;  Location: [facility];  Service: Orthopedics;  Laterality: Right;  . TONSILLECTOMY    . TOTAL KNEE ARTHROPLASTY Left 04/03/2014   dr whitfield  . TOTAL KNEE ARTHROPLASTY Left 04/03/2014   Procedure: TOTAL KNEE ARTHROPLASTY;  Surgeon: Garald Balding, MD;  Location: [facility];  Service: Orthopedics;  Laterality: Left;  . TOTAL KNEE REVISION  03/08/2012   Procedure: TOTAL KNEE REVISION;  Surgeon: Garald Balding, MD;  Location: [place];  Service: Orthopedics;  Laterality: Right;  removal total knee hardware and placement of antibiotic cement spacer and antibiotic beads  . TOTAL KNEE REVISION  05/17/2012   Procedure: TOTAL KNEE REVISION;  Surgeon: Garald Balding, MD;  Location: [place] [facility];  Service: Orthopedics;  Laterality: Right;  right total knee revision, removal of antibiotic spacer     Current Outpatient Medications  Medication Sig Dispense Refill  . apixaban (ELIQUIS) 5 MG TABS tablet Take 1 tablet (5 mg total) by mouth 2 (two) times daily. 60 tablet 11  . atorvastatin (LIPITOR) 20 MG tablet TAKE 1 TABLET BY MOUTH DAILY. 90 tablet 1  . Blood Glucose Monitoring Suppl (ACCU-CHEK AVIVA PLUS) w/Device KIT Use to check blood once sugar daily. 1 kit 0  . calcium-vitamin D (OSCAL WITH D 500-200)  500-200 MG-UNIT per tablet Take 1 tablet by mouth 2 (two) times daily.      . carvedilol (COREG) 12.5 MG tablet Take 0.5 tablets (6.25 mg total) by mouth 2 (two) times daily with a meal. 60 tablet 11  . cetirizine (ZYRTEC) 10 MG tablet Take 10 mg by mouth daily as needed for allergies.    . cholestyramine light (PREVALITE) 4 g packet TAKE 1 PACKET TWICE A DAY WITH A MEAL, TAKE AT LEAST 2 HOURS AWAY FROM OTHER MEDICATIONS. 180 packet 3  . citalopram (CELEXA) 20 MG tablet TAKE 1 TABLET BY MOUTH EVERY DAY 90 tablet 1  . clindamycin (CLEOCIN) 300 MG capsule Take 2 capsules ('600mg'$ ) one hour prior to procedure 10 capsule 0  . diltiazem (CARDIZEM CD) 120 MG 24 hr capsule Take by mouth daily.  0  . ferrous sulfate 325 (65 FE) MG EC tablet Take 325 mg by mouth 2 (two) times daily.    Marland Kitchen glipiZIDE (GLUCOTROL) 5 MG tablet Take 2.5 mg by mouth 2 (two) times daily before a meal.    . glucose blood (ACCU-CHEK AVIVA PLUS) test strip Use to check blood sugar daily. 100 each 3  . hydrochlorothiazide (HYDRODIURIL) 25 MG tablet TAKE 1 TABLET BY MOUTH EVERY DAY **TAKE WITH LOSARTAN**  3  . isosorbide mononitrate (IMDUR) 30 MG 24 hr tablet TAKE 1 TABLET BY MOUTH EVERY DAY 90 tablet 1  . losartan (COZAAR) 100 MG tablet TAKE 1 TABLET BY MOUTH EVERY DAY **TAKE WITH HCTZ**  3  . nystatin cream (MYCOSTATIN) Apply 1 application topically 2 (two) times daily as needed for dry skin. 30 g 1  . omeprazole (PRILOSEC) 20 MG capsule TAKE 2 CAPSULES BY MOUTH EVERY DAY 180 capsule 3   No current facility-administered medications for this visit.     Allergies:   Amoxicillin-pot clavulanate    ROS:  Please see the history of present illness.   Otherwise, review of systems are positive for none.   All other systems are reviewed and negative.    PHYSICAL EXAM: VS:  BP 122/78   Pulse 97   Ht 5' 5.25" (1.657 m)   Wt 165 lb (74.8 kg)   BMI 27.25 kg/m  , BMI Body mass index is 27.25 kg/m. GENERAL:  Well appearing NECK:  No  jugular venous distention, waveform within normal limits, carotid upstroke brisk and symmetric, no bruits, no thyromegaly LYMPHATICS:  No cervical, inguinal adenopathy LUNGS:  Clear to auscultation bilaterally CHEST:  Unremarkable HEART:  PMI not displaced or sustained,S1 and S2 within normal limits, no S3, no clicks, no rubs, no murmurs, irregular ABD:  Flat, positive bowel sounds normal in frequency in  pitch, no bruits, no rebound, no guarding, no midline pulsatile mass, no hepatomegaly, no splenomegaly EXT:  2 plus pulses throughout, no edema, no cyanosis no clubbing    EKG:  EKG is ordered today. The ekg ordered today demonstrates atrial fibrillation, rate 97, axis within normal limits, intervals within normal limits, nonspecific diffuse T wave changes.   Recent Labs: 02/22/2018: Hemoglobin 11.6; Platelets 189.0 08/23/2018: ALT 14; BUN 32; Creatinine, Ser 1.55; Potassium 3.5; Sodium 143    Lipid Panel    Component Value Date/Time   CHOL 105 08/23/2018 0938   TRIG 199.0 (H) 08/23/2018 0938   TRIG 240 (HH) 09/07/2006 0849   HDL 38.40 (L) 08/23/2018 0938   CHOLHDL 3 08/23/2018 0938   VLDL 39.8 08/23/2018 0938   LDLCALC 27 08/23/2018 0938   LDLDIRECT 50.0 10/22/2014 0841      Wt Readings from Last 3 Encounters:  09/08/18 165 lb (74.8 kg)  09/06/18 161 lb 12 oz (73.4 kg)  07/19/18 165 lb (74.8 kg)      Other studies Reviewed: Additional studies/ records that were reviewed today include: None. Review of the above records demonstrates:  Please see elsewhere in the note.     ASSESSMENT AND PLAN:  ATRIAL FIB:  Ms. SHALA BAUMBACH has a CHA2DS2 - VASc score of .    She is absolutely asymptomatic with this.  We talked about anticoagulation and possible cardioversion but she not seeing an advantage to this would prefer just rate control and anticoagulation without cardioversion.  Therefore, she will be on Eliquis.  She will stop her aspirin.  She is on the right dose 5 mg twice  daily although this will change when she is 79.  I will check a 24-hour Holter to make sure this is permanent and that she has good rate control.  I will also check an echocardiogram.  I do not see a TSH.  I will check this.  CAD: She has no chest discomfort.  No further cardiovascular testing is suggested.  She will continue with risk reduction.  HTN: Blood pressures controlled.  Continue meds as listed.  CKD: Creatinine is elevated at 1.55.   Current medicines are reviewed at length with the patient today.  The patient does not have concerns regarding medicines.  The following changes have been made:  As above  Labs/ tests ordered today include:   Orders Placed This Encounter  Procedures  . TSH  . HOLTER MONITOR - 24 HOUR  . ECHOCARDIOGRAM COMPLETE     Disposition:   FU with me in 6 weeks.     Signed, Minus Breeding, MD  09/08/2018 4:55 PM    [facility]

## 2018-09-08 NOTE — Patient Instructions (Addendum)
Medication Instructions:  STOP- Aspirin  START- Eliquis 5 mg daily  If you need a refill on your cardiac medications before your next appointment, please call your pharmacy.  Labwork: TSH Today  If you have labs (blood work) drawn today and your tests are completely normal, you will receive your results only by MyChart Message (if you have MyChart) -OR- A paper copy in the mail.  If you have any lab test that is abnormal or we need to change your treatment, we will call you to review these results.  Testing/Procedures: Your physician has requested that you have an echocardiogram. Echocardiography is a painless test that uses sound waves to create images of your heart. It provides your doctor with information about the size and shape of your heart and how well your heart's chambers and valves are working. This procedure takes approximately one hour. There are no restrictions for this procedure.  Your physician has recommended that you wear a 24 hour holter monitor. Holter monitors are medical devices that record the heart's electrical activity. Doctors most often use these monitors to diagnose arrhythmias. Arrhythmias are problems with the speed or rhythm of the heartbeat. The monitor is a small, portable device. You can wear one while you do your normal daily activities. This is usually used to diagnose what is causing palpitations/syncope (passing out).  Follow-Up: . Your physician recommends that you schedule a follow-up appointment in: [place], you and your health needs are our priority.  As part of our continuing mission to provide you with exceptional heart care, we have created designated Provider Care Teams.  These Care Teams include your primary Cardiologist (physician) and Advanced Practice Providers (APPs -  Physician Assistants and Nurse Practitioners) who all work together to provide you with the care you need, when you need it.

## 2018-09-14 NOTE — Addendum Note (Signed)
Addended by: Therisa Doyne on: 09/14/2018 04:59 PM   Modules accepted: Orders

## 2018-09-15 ENCOUNTER — Telehealth: Payer: Self-pay | Admitting: Family Medicine

## 2018-09-15 NOTE — Telephone Encounter (Signed)
Pt need clarification on how much Coreg she is suppose to take. The direction on the bottle and print on AVS is different. Please advise

## 2018-09-15 NOTE — Telephone Encounter (Signed)
Ms. Jeppsen notified by telephone per her AVS and office note she is to increase Coreg to 12.5 mg twice daily.   Medication list updated.

## 2018-09-22 ENCOUNTER — Ambulatory Visit (INDEPENDENT_AMBULATORY_CARE_PROVIDER_SITE_OTHER): Payer: Medicare Other

## 2018-09-22 ENCOUNTER — Ambulatory Visit (HOSPITAL_COMMUNITY): Payer: Medicare Other | Attending: Cardiovascular Disease

## 2018-09-22 ENCOUNTER — Other Ambulatory Visit: Payer: Self-pay

## 2018-09-22 ENCOUNTER — Other Ambulatory Visit: Payer: Self-pay | Admitting: Gastroenterology

## 2018-09-22 DIAGNOSIS — I480 Paroxysmal atrial fibrillation: Secondary | ICD-10-CM

## 2018-09-26 ENCOUNTER — Other Ambulatory Visit: Payer: Self-pay | Admitting: Family Medicine

## 2018-09-26 DIAGNOSIS — Z1231 Encounter for screening mammogram for malignant neoplasm of breast: Secondary | ICD-10-CM

## 2018-09-26 MED ORDER — CARVEDILOL 12.5 MG PO TABS: 12.5000 mg | ORAL_TABLET | Freq: Two times a day (BID) | ORAL | 1 refills | Status: DC

## 2018-09-26 NOTE — Telephone Encounter (Signed)
Pt stopped by office to let us know that pharmacy does not have updated dosage. They still have the 6.25 mg by mouth twice a day. She does not have enough to get her by and the pharmacy will not refill.

## 2018-09-26 NOTE — Telephone Encounter (Signed)
Refill on Coreg 12.5 mg to take twice a day [facility] to [facility] [address].

## 2018-09-26 NOTE — Addendum Note (Signed)
Addended by: Carter Kitten on: 09/26/2018 09:59 AM   Modules accepted: Orders

## 2018-09-26 NOTE — Telephone Encounter (Signed)
Tonya [name] notified by telephone that refills have been sent to her pharmacy.

## 2018-09-29 ENCOUNTER — Other Ambulatory Visit: Payer: Self-pay | Admitting: Family Medicine

## 2018-10-03 ENCOUNTER — Other Ambulatory Visit: Payer: Self-pay | Admitting: Cardiology

## 2018-10-06 ENCOUNTER — Other Ambulatory Visit: Payer: Self-pay | Admitting: Family Medicine

## 2018-10-22 ENCOUNTER — Encounter: Payer: Self-pay | Admitting: Cardiology

## 2018-10-22 NOTE — Progress Notes (Signed)
Cardiology Office Note   Date:  10/24/2018   ID:  Tonya [name], DOB Oct 04, 1940, MRN [id number]  PCP:  [name], MD  Cardiologist:   No primary care provider on file. Referring:  [name], MD  Chief Complaint  Patient presents with  . Atrial Fibrillation      History of Present Illness:  Tonya [name] is a 78 y.o. female who presents for evaluation of atrial fibrillation.   She is being managed with anticoagulation.  He wore a Holter and had her Coreg increased secondary to increased ventricular rate.  She did increase this dose and she feels fine.  She is not noticing any tachypalpitations.  She has absolutely no symptoms.  She denies any presyncope or syncope.  She denies any chest pressure, neck or arm discomfort.  She is had no shortness of breath, PND or orthopnea.   Past Medical History:  Diagnosis Date  . Adenocarcinoma, colon ([place])   . Anemia    iron deficiency  . Anxiety   . Carpal tunnel syndrome   . Cataract    REMOVED BILATERAL  . Chronic diarrhea   . Colon polyps    Tubular adenoma  . Degenerative disk disease   . Depression   . Diabetes mellitus, type 2 ([place])   . Dysrhythmia    h/o AFIB  . GERD (gastroesophageal reflux disease)   . Hyperlipidemia   . Hypertension   . IBS (irritable bowel syndrome)   . Osteoarthritis   . Osteoporosis   . S/P coronary artery stent placement 2007    Past Surgical History:  Procedure Laterality Date  . CARDIAC CATHETERIZATION  12/10   LAD stent patent. Insignificant CAD, otherwise EF 60-65%  . CARPAL TUNNEL RELEASE    . CATARACT EXTRACTION  03/2015  . CHOLECYSTECTOMY    . COLON SURGERY    . COLONOSCOPY    . DILATION AND CURETTAGE OF UTERUS    . HEMATOMA EVACUATION     after knee replacement  . KNEE ARTHROSCOPY     bilateral  . LUMBAR LAMINECTOMY N/A 11/26/2015   Procedure: LUMBAR DECOMPRESSIVE LAMINECTOMY L3-4 AND L4-5, LEFT L2-3 HEMILAMINECTOMY;  Surgeon: Jessy Oto, MD;  Location: [facility];   Service: Orthopedics;  Laterality: N/A;  . PARTIAL COLECTOMY  11/2008   right, for adenocarcinoma  . REPLACEMENT TOTAL KNEE  02/3844   right, complicated by hemarthrosis   . REVERSE SHOULDER ARTHROPLASTY Right 05/28/2016  . REVERSE SHOULDER ARTHROPLASTY Right 05/28/2016   Procedure: REVERSE SHOULDER ARTHROPLASTY;  Surgeon: Meredith Pel, MD;  Location: [facility];  Service: Orthopedics;  Laterality: Right;  . TONSILLECTOMY    . TOTAL KNEE ARTHROPLASTY Left 04/03/2014   dr whitfield  . TOTAL KNEE ARTHROPLASTY Left 04/03/2014   Procedure: TOTAL KNEE ARTHROPLASTY;  Surgeon: Garald Balding, MD;  Location: [facility];  Service: Orthopedics;  Laterality: Left;  . TOTAL KNEE REVISION  03/08/2012   Procedure: TOTAL KNEE REVISION;  Surgeon: Garald Balding, MD;  Location: [facility];  Service: Orthopedics;  Laterality: Right;  removal total knee hardware and placement of antibiotic cement spacer and antibiotic beads  . TOTAL KNEE REVISION  05/17/2012   Procedure: TOTAL KNEE REVISION;  Surgeon: Garald Balding, MD;  Location: [facility];  Service: Orthopedics;  Laterality: Right;  right total knee revision, removal of antibiotic spacer     Current Outpatient Medications  Medication Sig Dispense Refill  . apixaban (ELIQUIS) 5 MG TABS tablet Take 1  tablet (5 mg total) by mouth 2 (two) times daily. 60 tablet 11  . atorvastatin (LIPITOR) 20 MG tablet TAKE 1 TABLET BY MOUTH DAILY. 90 tablet 1  . Blood Glucose Monitoring Suppl (ACCU-CHEK AVIVA PLUS) w/Device KIT Use to check blood once sugar daily. 1 kit 0  . calcium-vitamin D (OSCAL WITH D 500-200) 500-200 MG-UNIT per tablet Take 1 tablet by mouth 2 (two) times daily.      . carvedilol (COREG) 12.5 MG tablet Take 1 tablet (12.5 mg total) by mouth 2 (two) times daily with a meal. 180 tablet 1  . cetirizine (ZYRTEC) 10 MG tablet Take 10 mg by mouth daily as needed for allergies.    . cholestyramine light (PREVALITE) 4 g packet USE 1 PACKET TWICE DAILY WITH A MEAL, AT  LEAST 2 HOURS AWAY FROM OTHER MEDS 180 packet 4  . citalopram (CELEXA) 20 MG tablet TAKE 1 TABLET BY MOUTH EVERY DAY 90 tablet 1  . clindamycin (CLEOCIN) 300 MG capsule Take 2 capsules ('600mg'$ ) one hour prior to procedure 10 capsule 0  . diltiazem (CARDIZEM CD) 120 MG 24 hr capsule Take by mouth daily.  0  . ferrous sulfate 325 (65 FE) MG EC tablet Take 325 mg by mouth 2 (two) times daily.    Marland Kitchen glipiZIDE (GLUCOTROL) 5 MG tablet Take 2.5 mg by mouth 2 (two) times daily before a meal.    . glucose blood (ACCU-CHEK AVIVA PLUS) test strip Use to check blood sugar daily. 100 each 3  . hydrochlorothiazide (HYDRODIURIL) 25 MG tablet TAKE 1 TABLET BY MOUTH EVERY DAY **TAKE WITH LOSARTAN**  3  . isosorbide mononitrate (IMDUR) 30 MG 24 hr tablet TAKE 1 TABLET BY MOUTH EVERY DAY 90 tablet 3  . losartan (COZAAR) 100 MG tablet TAKE 1 TABLET BY MOUTH EVERY DAY **TAKE WITH HCTZ**  3  . nystatin cream (MYCOSTATIN) Apply 1 application topically 2 (two) times daily as needed for dry skin. 30 g 1  . omeprazole (PRILOSEC) 20 MG capsule TAKE 2 CAPSULES BY MOUTH EVERY DAY 180 capsule 3   No current facility-administered medications for this visit.     Allergies:   Amoxicillin-pot clavulanate    ROS:  Please see the history of present illness.   Otherwise, review of systems are positive for none.   All other systems are reviewed and negative.    PHYSICAL EXAM: VS:  BP 116/78 (BP Location: Left Arm, Patient Position: Sitting, Cuff Size: Normal)   Pulse 86   Ht 5' 5.5" (1.664 m)   Wt 163 lb (73.9 kg)   BMI 26.71 kg/m  , BMI Body mass index is 26.71 kg/m. GENERAL:  Well appearing NECK:  No jugular venous distention, waveform within normal limits, carotid upstroke brisk and symmetric, no bruits, no thyromegaly LUNGS:  Clear to auscultation bilaterally CHEST:  Unremarkable HEART:  PMI not displaced or sustained,S1 and S2 within normal limits, no S3, no clicks, no rubs, no murmurs, irregular ABD:  Flat,  positive bowel sounds normal in frequency in pitch, no bruits, no rebound, no guarding, no midline pulsatile mass, no hepatomegaly, no splenomegaly EXT:  2 plus pulses throughout, no edema, no cyanosis no clubbing   EKG:  EKG is not ordered today.   Recent Labs: 02/22/2018: Hemoglobin 11.6; Platelets 189.0 08/23/2018: ALT 14; BUN 32; Creatinine, Ser 1.55; Potassium 3.5; Sodium 143    Lipid Panel    Component Value Date/Time   CHOL 105 08/23/2018 0938   TRIG 199.0 (H) 08/23/2018 9892  TRIG 240 (HH) 09/07/2006 0849   HDL 38.40 (L) 08/23/2018 0938   CHOLHDL 3 08/23/2018 0938   VLDL 39.8 08/23/2018 0938   LDLCALC 27 08/23/2018 0938   LDLDIRECT 50.0 10/22/2014 0841      Wt Readings from Last 3 Encounters:  10/24/18 163 lb (73.9 kg)  09/08/18 165 lb (74.8 kg)  09/06/18 161 lb 12 oz (73.4 kg)      Other studies Reviewed: Additional studies/ records that were reviewed today include: None. Review of the above records demonstrates:     ASSESSMENT AND PLAN:  ATRIAL FIB:  Tonya [name] has a CHA2DS2 - VASc score of 6.   She is going to buy a pulse ox and keep her heart rate and we talked about target.  She seems to be tolerating the blood thinner.  I am to check a CBC.  It does not appear that a TSH was drawn so we will draw this as well.  Further adjustments to her medications will be based on her heart rate.  CAD:  The patient has no new sypmtoms.  No further cardiovascular testing is indicated.  We will continue with aggressive risk reduction and meds as listed.  HTN: Blood pressures is controlled.  No change in therapy.   CKD: Creatinine is elevated at 1.55.   This can be followed by her PCP.    Current medicines are reviewed at length with the patient today.  The patient does not have concerns regarding medicines.  The following changes have been made:  None  Labs/ tests ordered today include:    Orders Placed This Encounter  Procedures  . CBC  . TSH      Disposition:   FU with me APP in six months.   Signed, Minus Breeding, MD  10/24/2018 12:30 PM    [facility]

## 2018-10-24 ENCOUNTER — Ambulatory Visit (INDEPENDENT_AMBULATORY_CARE_PROVIDER_SITE_OTHER): Payer: Medicare Other | Admitting: Cardiology

## 2018-10-24 ENCOUNTER — Encounter: Payer: Self-pay | Admitting: Cardiology

## 2018-10-24 VITALS — BP 116/78 | HR 86 | Ht 65.5 in | Wt 163.0 lb

## 2018-10-24 DIAGNOSIS — I4821 Permanent atrial fibrillation: Secondary | ICD-10-CM

## 2018-10-24 DIAGNOSIS — I10 Essential (primary) hypertension: Secondary | ICD-10-CM

## 2018-10-24 DIAGNOSIS — R5383 Other fatigue: Secondary | ICD-10-CM | POA: Diagnosis not present

## 2018-10-24 DIAGNOSIS — I2510 Atherosclerotic heart disease of native coronary artery without angina pectoris: Secondary | ICD-10-CM

## 2018-10-24 DIAGNOSIS — Z79899 Other long term (current) drug therapy: Secondary | ICD-10-CM

## 2018-10-24 DIAGNOSIS — N182 Chronic kidney disease, stage 2 (mild): Secondary | ICD-10-CM | POA: Insufficient documentation

## 2018-10-24 NOTE — Patient Instructions (Signed)
Medication Instructions:  Continue current medications  If you need a refill on your cardiac medications before your next appointment, please call your pharmacy.  Labwork: CBC and TSH HERE IN OUR OFFICE AT [facility] You will NOT need to fast   Take the provided lab slips with you to the lab for your blood draw.   When you have your labs (blood work) drawn today and your tests are completely normal, you will receive your results only by MyChart Message (if you have MyChart) -OR-  A paper copy in the mail.  If you have any lab test that is abnormal or we need to change your treatment, we will call you to review these results.  Testing/Procedures: None Ordered  Follow-Up: You will need a follow up appointment in 6 months.  Please call our office 2 months in advance to schedule this appointment.  You may see Dr Percival Spanish or one of the following Advanced Practice Providers on your designated Care Team:   Rosaria Ferries, PA-C . Jory Sims, DNP, ANP    At [facility], you and your health needs are our priority.  As part of our continuing mission to provide you with exceptional heart care, we have created designated Provider Care Teams.  These Care Teams include your primary Cardiologist (physician) and Advanced Practice Providers (APPs -  Physician Assistants and Nurse Practitioners) who all work together to provide you with the care you need, when you need it.  Thank you for choosing [facility] at [facility]!!

## 2018-10-25 LAB — CBC
HEMOGLOBIN: 13 g/dL (ref 11.1–15.9)
Hematocrit: 38.7 % (ref 34.0–46.6)
MCH: 31.1 pg (ref 26.6–33.0)
MCHC: 33.6 g/dL (ref 31.5–35.7)
MCV: 93 fL (ref 79–97)
Platelets: 209 10*3/uL (ref 150–450)
RBC: 4.18 x10E6/uL (ref 3.77–5.28)
RDW: 11.9 % (ref 11.7–15.4)
WBC: 8.6 10*3/uL (ref 3.4–10.8)

## 2018-10-25 LAB — TSH: TSH: 3.81 u[IU]/mL (ref 0.450–4.500)

## 2018-10-31 ENCOUNTER — Ambulatory Visit
Admission: RE | Admit: 2018-10-31 | Discharge: 2018-10-31 | Disposition: A | Discharge status: Home / Self Care | Payer: Medicare Other | Source: Ambulatory Visit | Attending: Family Medicine | Admitting: Family Medicine

## 2018-10-31 DIAGNOSIS — Z1231 Encounter for screening mammogram for malignant neoplasm of breast: Secondary | ICD-10-CM

## 2018-11-21 ENCOUNTER — Ambulatory Visit: Payer: Medicare Other | Admitting: Cardiology

## 2018-11-22 ENCOUNTER — Other Ambulatory Visit: Payer: Self-pay | Admitting: Cardiology

## 2018-11-29 ENCOUNTER — Telehealth: Payer: Self-pay | Admitting: *Deleted

## 2018-11-29 NOTE — Telephone Encounter (Signed)
She does not need to take antibiotics

## 2018-11-29 NOTE — Telephone Encounter (Signed)
                  On March 11 I have a dental appointment to extract a broken tooth.  Would you [facility] [address] [facility] [place] that I need to take before this appointment. Thank you very much.    Does this pt need SBE prophylaxis?

## 2018-11-30 NOTE — Telephone Encounter (Signed)
Spoke with pt about Dr Percival Spanish responds.

## 2018-12-06 ENCOUNTER — Other Ambulatory Visit: Payer: Self-pay | Admitting: Cardiology

## 2018-12-08 ENCOUNTER — Telehealth: Payer: Self-pay | Admitting: Cardiology

## 2018-12-08 NOTE — Telephone Encounter (Signed)
New Message    Patient calling the office for samples of medication:   1.  What medication and dosage are you requesting samples for? Eliquis 5mg   2.  Are you currently out of this medication? Patient will be out tomorrow morning.

## 2018-12-08 NOTE — Telephone Encounter (Signed)
Patient aware Eliquis 5 mg #3 lot #[id number] exp 6/22 at [facility] for pick up

## 2018-12-14 ENCOUNTER — Ambulatory Visit (INDEPENDENT_AMBULATORY_CARE_PROVIDER_SITE_OTHER): Payer: Medicare Other | Admitting: Family Medicine

## 2018-12-14 ENCOUNTER — Telehealth: Payer: Self-pay

## 2018-12-14 ENCOUNTER — Encounter: Payer: Self-pay | Admitting: Family Medicine

## 2018-12-14 ENCOUNTER — Telehealth: Payer: Self-pay | Admitting: Cardiology

## 2018-12-14 ENCOUNTER — Other Ambulatory Visit: Payer: Self-pay

## 2018-12-14 VITALS — BP 142/74 | HR 124 | Temp 98.8°F | Ht 65.25 in | Wt 162.8 lb

## 2018-12-14 DIAGNOSIS — S161XXA Strain of muscle, fascia and tendon at neck level, initial encounter: Secondary | ICD-10-CM | POA: Diagnosis not present

## 2018-12-14 DIAGNOSIS — I4891 Unspecified atrial fibrillation: Secondary | ICD-10-CM | POA: Diagnosis not present

## 2018-12-14 DIAGNOSIS — I4821 Permanent atrial fibrillation: Secondary | ICD-10-CM

## 2018-12-14 MED ORDER — LOSARTAN POTASSIUM 100 MG PO TABS: ORAL_TABLET | ORAL | 1 refills | Status: DC

## 2018-12-14 MED ORDER — DILTIAZEM HCL ER COATED BEADS 180 MG PO CP24: 180.0000 mg | ORAL_CAPSULE | Freq: Every day | ORAL | 0 refills | Status: DC

## 2018-12-14 NOTE — Telephone Encounter (Signed)
Returned call to Dr.Cody she stated she already spoke to on call Dr.Already taken care of.

## 2018-12-14 NOTE — Telephone Encounter (Signed)
New Message          Dr.Jessica Einar Pheasant from [facility] is calling because the patient is in permeant in  A-Fib

## 2018-12-14 NOTE — Assessment & Plan Note (Signed)
Currently in afib w/ rvr with rate of 120s. Called cardiology to discuss options. They advised increase Diltiazem to 180 mg daily. Which pt was agreeable to. Recommended home monitoring and checking in with cardiology based on rate changes.

## 2018-12-14 NOTE — Assessment & Plan Note (Signed)
Pain primarily on the neck muscles w/o fever to suggest infection. Suspect strain. Recommend tylenol and heat/ice.

## 2018-12-14 NOTE — Patient Instructions (Addendum)
Neck Strain 1) Take 2 extra strength tylenol -- twice daily 2) Try heat and ice to see if that helps 3) Try to do the exercises below - if they cause worsening pain, then only go as far as you can feel a stretch but no sharp pain. Hold for as long as you are able. Repeat as often as you are able.    Afib - increase Diltiazem - Check Heart Rate at home - if still >120 call cardiology - if shortness of breath, fainting, chest pain - call her or cardiology  Cervical Strain and Sprain Rehab Ask your health care provider which exercises are safe for you. Do exercises exactly as told by your health care provider and adjust them as directed. It is normal to feel mild stretching, pulling, tightness, or discomfort as you do these exercises, but you should stop right away if you feel sudden pain or your pain gets worse.Do not begin these exercises until told by your health care provider. Stretching and range of motion exercises These exercises warm up your muscles and joints and improve the movement and flexibility of your neck. These exercises also help to relieve pain, numbness, and tingling. Exercise A: Cervical side bend  1. Using good posture, sit on a stable chair or stand up. 2. Without moving your shoulders, slowly tilt your left / right ear to your shoulder until you feel a stretch in your neck muscles. You should be looking straight ahead. 3. Hold for ____10______ seconds. 4. Repeat with the other side of your neck. Repeat ____3______ times. Complete this exercise _____2_____ times a day. Exercise B: Cervical rotation  1. Using good posture, sit on a stable chair or stand up. 2. Slowly turn your head to the side as if you are looking over your left / right shoulder. ? Keep your eyes level with the ground. ? Stop when you feel a stretch along the side and the back of your neck. 3. Hold for _____10_____ seconds. 4. Repeat this by turning to your other side. Repeat ____3______ times.  Complete this exercise ____2______ times a day. Exercise C: Thoracic extension and pectoral stretch 1. Roll a towel or a small blanket so it is about 4 inches (10 cm) in diameter. 2. Lie down on your back on a firm surface. 3. Put the towel lengthwise, under your spine in the middle of your back. It should not be not under your shoulder blades. The towel should line up with your spine from your middle back to your lower back. 4. Put your hands behind your head and let your elbows fall out to your sides. 5. Hold for ______10____ seconds. Repeat ____3______ times. Complete this exercise ___2_______ times a day. Strengthening exercises These exercises build strength and endurance in your neck. Endurance is the ability to use your muscles for a long time, even after your muscles get tired. Exercise D: Upper cervical flexion, isometric 1. Lie on your back with a thin pillow behind your head and a small rolled-up towel under your neck. 2. Gently tuck your chin toward your chest and nod your head down to look toward your feet. Do not lift your head off the pillow. 3. Hold for _____10_____ seconds. 4. Release the tension slowly. Relax your neck muscles completely before you repeat this exercise. Repeat ___3_______ times. Complete this exercise ____2______ times a day. Exercise E: Cervical extension, isometric  1. Stand about 6 inches (15 cm) away from a wall, with your back facing the wall.  2. Place a soft object, about 6-8 inches (15-20 cm) in diameter, between the back of your head and the wall. A soft object could be a small pillow, a ball, or a folded towel. 3. Gently tilt your head back and press into the soft object. Keep your jaw and forehead relaxed. 4. Hold for ____10______ seconds. 5. Release the tension slowly. Relax your neck muscles completely before you repeat this exercise. Repeat ______3____ times. Complete this exercise _______2___ times a day. Posture and body mechanics Body  mechanics refers to the movements and positions of your body while you do your daily activities. Posture is part of body mechanics. Good posture and healthy body mechanics can help to relieve stress in your body's tissues and joints. Good posture means that your spine is in its natural S-curve position (your spine is neutral), your shoulders are pulled back slightly, and your head is not tipped forward. The following are general guidelines for applying improved posture and body mechanics to your everyday activities. Standing   When standing, keep your spine neutral and keep your feet about hip-width apart. Keep a slight bend in your knees. Your ears, shoulders, and hips should line up.  When you do a task in which you stand in one place for a long time, place one foot up on a stable object that is 2-4 inches (5-10 cm) high, such as a footstool. This helps keep your spine neutral. Sitting   When sitting, keep your spine neutral and your keep feet flat on the floor. Use a footrest, if necessary, and keep your thighs parallel to the floor. Avoid rounding your shoulders, and avoid tilting your head forward.  When working at a desk or a computer, keep your desk at a height where your hands are slightly lower than your elbows. Slide your chair under your desk so you are close enough to maintain good posture.  When working at a computer, place your monitor at a height where you are looking straight ahead and you do not have to tilt your head forward or downward to look at the screen. Resting When lying down and resting, avoid positions that are most painful for you. Try to support your neck in a neutral position. You can use a contour pillow or a small rolled-up towel. Your pillow should support your neck but not push on it. This information is not intended to replace advice given to you by your health care provider. Make sure you discuss any questions you have with your health care provider. Document  Released: 09/14/2005 Document Revised: 05/21/2016 Document Reviewed: 08/21/2015 Elsevier Interactive Patient Education  2019 [employer].

## 2018-12-14 NOTE — Telephone Encounter (Signed)
Since 12/07/18 pt has had continuous dull pain in lt side of neck that goes down to shoulder and into back, pain now is also in rt side of neck as well. Pain level now is 9. When pt moves pain becomes sharp. No CP,SOB,sweats, N&V,fever, cough or S/T. Pt did take 2 tylenol last night which helped the pain somewhat but the pain did not go away. Pt said she could not take ibuprofen.pt request appt for eval. Pt scheduled appt with Dr Einar Pheasant 12/14/18 at 10:40.

## 2018-12-14 NOTE — Progress Notes (Signed)
Subjective:     Tonya [name] is a 78 y.o. female presenting for Neck Pain (x 1 week. Neck pain on both sides and pain is going down her upper back now also. No trauma to the area. Took Tylenol yesterday and that did ease the pain some.No falls)     Neck Pain   This is a new problem. The current episode started in the past 7 days. The problem has been unchanged. The pain is associated with a sleep position. The pain is present in the right side and left side (radiates to left shoulder). The quality of the pain is described as stabbing. The pain is severe. The symptoms are aggravated by position, twisting, coughing and sneezing. The pain is same all the time. Associated symptoms include headaches (with neck pain). Pertinent negatives include no chest pain, fever, numbness, pain with swallowing, photophobia, syncope, tingling, visual change or weakness. She has tried acetaminophen for the symptoms. The treatment provided mild relief.   #Afib - just started elliquis - on coreg 12.5 mg BID - is having trouble getting medications refilled - typically in 80-90s - today noticed HR in 120s   Review of Systems  Constitutional: Positive for chills. Negative for fever.  Eyes: Negative for photophobia.  Cardiovascular: Negative for chest pain and syncope.  Musculoskeletal: Positive for neck pain.  Neurological: Positive for headaches (with neck pain). Negative for tingling, weakness and numbness.     12/14/2018: Telephone - noted dull neck pain since 3/11 which is getting worse and worse with activity. Improved with tylenol   Social History   Tobacco Use  Smoking Status Never Smoker  Smokeless Tobacco Never Used        Objective:    BP Readings from Last 3 Encounters:  12/14/18 (!) 142/74  10/24/18 116/78  09/08/18 122/78   Wt Readings from Last 3 Encounters:  12/14/18 162 lb 12 oz (73.8 kg)  10/24/18 163 lb (73.9 kg)  09/08/18 165 lb (74.8 kg)    BP (!) 142/74   Pulse  (!) 124 Comment: irregular  Temp 98.8 F (37.1 C)   Ht 5' 5.25" (1.657 m)   Wt 162 lb 12 oz (73.8 kg)   SpO2 98%   BMI 26.88 kg/m    Physical Exam Constitutional:      General: She is not in acute distress.    Appearance: She is well-developed. She is not diaphoretic.  HENT:     Right Ear: External ear normal.     Left Ear: External ear normal.     Nose: Nose normal.  Eyes:     Conjunctiva/sclera: Conjunctivae normal.  Neck:     Musculoskeletal: Neck supple.  Cardiovascular:     Rate and Rhythm: Tachycardia present. Rhythm irregular.     Heart sounds: Murmur present.  Pulmonary:     Effort: Pulmonary effort is normal. No respiratory distress.     Breath sounds: Normal breath sounds. No wheezing.  Musculoskeletal:     Cervical back: She exhibits decreased range of motion (all directions 2/2 to pain), tenderness (bilateral neck muscles along the trapezius) and pain. She exhibits no bony tenderness, no swelling and no edema.  Skin:    General: Skin is warm and dry.     Capillary Refill: Capillary refill takes less than 2 seconds.  Neurological:     Mental Status: She is alert. Mental status is at baseline.  Psychiatric:        Mood and Affect: Mood normal.  Behavior: Behavior normal.           Assessment & Plan:   Problem List Items Addressed This Visit      Cardiovascular and Mediastinum   Atrial fibrillation (HCC)    Currently in afib w/ rvr with rate of 120s. Called cardiology to discuss options. They advised increase Diltiazem to 180 mg daily. Which pt was agreeable to. Recommended home monitoring and checking in with cardiology based on rate changes.       Relevant Medications   diltiazem (DILTIAZEM CD) 180 MG 24 hr capsule     Musculoskeletal and Integument   Acute strain of neck muscle - Primary    Pain primarily on the neck muscles w/o fever to suggest infection. Suspect strain. Recommend tylenol and heat/ice.        Other Visit Diagnoses     Atrial fibrillation with RVR (HCC)       Relevant Medications   diltiazem (DILTIAZEM CD) 180 MG 24 hr capsule       Return if symptoms worsen or fail to improve.  Lesleigh Noe, MD

## 2018-12-23 ENCOUNTER — Other Ambulatory Visit: Payer: Self-pay | Admitting: Family Medicine

## 2019-02-22 ENCOUNTER — Telehealth: Payer: Self-pay | Admitting: Family Medicine

## 2019-02-22 DIAGNOSIS — I4821 Permanent atrial fibrillation: Secondary | ICD-10-CM

## 2019-02-22 MED ORDER — DILTIAZEM HCL ER COATED BEADS 180 MG PO CP24: 180.0000 mg | ORAL_CAPSULE | Freq: Every day | ORAL | 0 refills | Status: DC

## 2019-02-22 NOTE — Telephone Encounter (Signed)
I have refilled her medication but it looks like she needs to be scheduled for her MWV with Katha Cabal and CPE with Dr. Diona Browner.

## 2019-02-22 NOTE — Telephone Encounter (Signed)
Best number [phone] Pt calling stating [facility] [address] has been trying to get a refill  Diltiazem

## 2019-03-25 ENCOUNTER — Telehealth: Payer: Self-pay | Admitting: Family Medicine

## 2019-03-27 NOTE — Telephone Encounter (Signed)
Please schedule Medicare Wellness with fasting labs prior for Dr. Diona Browner.

## 2019-03-29 ENCOUNTER — Other Ambulatory Visit: Payer: Self-pay | Admitting: Family Medicine

## 2019-04-06 NOTE — Telephone Encounter (Signed)
Labs 10/5 cpx 10/13 Pt aware

## 2019-04-07 NOTE — Telephone Encounter (Signed)
Medicare wellness 10/5 cpx 10/13 Pt aware

## 2019-04-10 ENCOUNTER — Other Ambulatory Visit: Payer: Self-pay | Admitting: Pharmacist Clinician (PhC)/ Clinical Pharmacy Specialist

## 2019-04-10 MED ORDER — VALSARTAN 160 MG PO TABS: 160.0000 mg | ORAL_TABLET | Freq: Every day | ORAL | 3 refills | Status: DC

## 2019-05-01 ENCOUNTER — Other Ambulatory Visit: Payer: Self-pay | Admitting: Family Medicine

## 2019-05-15 NOTE — Progress Notes (Signed)
Cardiology Office Note   Date:  05/16/2019   ID:  Tonya [name], DOB 1941-06-14, MRN [id number]  PCP:  [name], MD  Cardiologist:   No primary care provider on file. Referring:  [name], MD  No chief complaint on file.     History of Present Illness: Tonya [name] is a 78 y.o. female who presents for evaluation of atrial fibrillation.  Since I last saw her she is done very well.  She does not notice her fibrillation.  She takes her pulse with a pulse ox and it is always below 100.  The patient denies any new symptoms such as chest discomfort, neck or arm discomfort. There has been no new shortness of breath, PND or orthopnea. There have been no reported palpitations, presyncope or syncope.   Past Medical History:  Diagnosis Date  . Adenocarcinoma, colon ([place])   . Anemia    iron deficiency  . Anxiety   . Carpal tunnel syndrome   . Cataract    REMOVED BILATERAL  . Chronic diarrhea   . Colon polyps    Tubular adenoma  . Degenerative disk disease   . Depression   . Diabetes mellitus, type 2 ([place])   . Dysrhythmia    h/o AFIB  . GERD (gastroesophageal reflux disease)   . Hyperlipidemia   . Hypertension   . IBS (irritable bowel syndrome)   . Osteoarthritis   . Osteoporosis   . S/P coronary artery stent placement 2007    Past Surgical History:  Procedure Laterality Date  . CARDIAC CATHETERIZATION  12/10   LAD stent patent. Insignificant CAD, otherwise EF 60-65%  . CARPAL TUNNEL RELEASE    . CATARACT EXTRACTION  03/2015  . CHOLECYSTECTOMY    . COLON SURGERY    . COLONOSCOPY    . DILATION AND CURETTAGE OF UTERUS    . HEMATOMA EVACUATION     after knee replacement  . KNEE ARTHROSCOPY     bilateral  . LUMBAR LAMINECTOMY N/A 11/26/2015   Procedure: LUMBAR DECOMPRESSIVE LAMINECTOMY L3-4 AND L4-5, LEFT L2-3 HEMILAMINECTOMY;  Surgeon: Jessy Oto, MD;  Location: [place];  Service: Orthopedics;  Laterality: N/A;  . PARTIAL COLECTOMY  11/2008   right,  for adenocarcinoma  . REPLACEMENT TOTAL KNEE  05/4584   right, complicated by hemarthrosis   . REVERSE SHOULDER ARTHROPLASTY Right 05/28/2016  . REVERSE SHOULDER ARTHROPLASTY Right 05/28/2016   Procedure: REVERSE SHOULDER ARTHROPLASTY;  Surgeon: Meredith Pel, MD;  Location: [place];  Service: Orthopedics;  Laterality: Right;  . TONSILLECTOMY    . TOTAL KNEE ARTHROPLASTY Left 04/03/2014   dr whitfield  . TOTAL KNEE ARTHROPLASTY Left 04/03/2014   Procedure: TOTAL KNEE ARTHROPLASTY;  Surgeon: Garald Balding, MD;  Location: [place];  Service: Orthopedics;  Laterality: Left;  . TOTAL KNEE REVISION  03/08/2012   Procedure: TOTAL KNEE REVISION;  Surgeon: Garald Balding, MD;  Location: [place];  Service: Orthopedics;  Laterality: Right;  removal total knee hardware and placement of antibiotic cement spacer and antibiotic beads  . TOTAL KNEE REVISION  05/17/2012   Procedure: TOTAL KNEE REVISION;  Surgeon: Garald Balding, MD;  Location: [place];  Service: Orthopedics;  Laterality: Right;  right total knee revision, removal of antibiotic spacer     Current Outpatient Medications  Medication Sig Dispense Refill  . apixaban (ELIQUIS) 5 MG TABS tablet Take 1 tablet (5 mg total) by mouth 2 (two) times daily. 60 tablet 11  .  atorvastatin (LIPITOR) 20 MG tablet TAKE 1 TABLET BY MOUTH DAILY. 90 tablet 1  . Blood Glucose Monitoring Suppl (ACCU-CHEK AVIVA PLUS) w/Device KIT Use to check blood once sugar daily. 1 kit 0  . calcium-vitamin D (OSCAL WITH D 500-200) 500-200 MG-UNIT per tablet Take 1 tablet by mouth 2 (two) times daily.      . carvedilol (COREG) 12.5 MG tablet TAKE 1 TABLET (12.5 MG TOTAL) BY MOUTH 2 (TWO) TIMES DAILY WITH A MEAL. 180 tablet 0  . cetirizine (ZYRTEC) 10 MG tablet Take 10 mg by mouth daily as needed for allergies.    . cholestyramine light (PREVALITE) 4 g packet USE 1 PACKET TWICE DAILY WITH A MEAL, AT LEAST 2 HOURS AWAY FROM OTHER MEDS 180 packet 4  . citalopram (CELEXA) 20 MG  tablet TAKE 1 TABLET BY MOUTH EVERY DAY 90 tablet 0  . clindamycin (CLEOCIN) 300 MG capsule Take 2 capsules (652m) one hour prior to procedure 10 capsule 0  . diltiazem (DILTIAZEM CD) 180 MG 24 hr capsule Take 1 capsule (180 mg total) by mouth daily. 90 capsule 0  . ferrous sulfate 325 (65 FE) MG EC tablet Take 325 mg by mouth 2 (two) times daily.    .Marland KitchenglipiZIDE (GLUCOTROL) 5 MG tablet Take 0.5 tablets (2.5 mg total) by mouth 2 (two) times daily before a meal. 90 tablet 1  . glucose blood (ACCU-CHEK AVIVA PLUS) test strip Use to check blood sugar daily. 100 each 3  . hydrochlorothiazide (HYDRODIURIL) 25 MG tablet TAKE 1 TABLET BY MOUTH EVERY DAY **TAKE WITH LOSARTAN** 90 tablet 1  . isosorbide mononitrate (IMDUR) 30 MG 24 hr tablet TAKE 1 TABLET BY MOUTH EVERY DAY 90 tablet 3  . nystatin cream (MYCOSTATIN) Apply 1 application topically 2 (two) times daily as needed for dry skin. 30 g 1  . omeprazole (PRILOSEC) 20 MG capsule TAKE 2 CAPSULES BY MOUTH EVERY DAY 180 capsule 3  . valsartan (DIOVAN) 160 MG tablet Take 1 tablet (160 mg total) by mouth daily. 90 tablet 3   No current facility-administered medications for this visit.     Allergies:   Amoxicillin-pot clavulanate    ROS:  Please see the history of present illness.   Otherwise, review of systems are positive for none.   All other systems are reviewed and negative.    PHYSICAL EXAM: VS:  BP 122/68 (BP Location: Left Arm, Patient Position: Sitting, Cuff Size: Normal)   Pulse 89   Temp (!) 97.3 F (36.3 C)   Ht 5' 5.5" (1.664 m)   Wt 162 lb (73.5 kg)   BMI 26.55 kg/m  , BMI Body mass index is 26.55 kg/m. GENERAL:  Well appearing NECK:  No jugular venous distention, waveform within normal limits, carotid upstroke brisk and symmetric, no bruits, no thyromegaly LUNGS:  Clear to auscultation bilaterally CHEST:  Unremarkable HEART:  PMI not displaced or sustained,S1 and S2 within normal limits, no S3, no clicks, no rubs, no murmurs,  irregular  ABD:  Flat, positive bowel sounds normal in frequency in pitch, no bruits, no rebound, no guarding, no midline pulsatile mass, no hepatomegaly, no splenomegaly EXT:  2 plus pulses throughout, no edema, no cyanosis no clubbing    EKG:  EKG is not ordered today.   Recent Labs: 08/23/2018: ALT 14; BUN 32; Creatinine, Ser 1.55; Potassium 3.5; Sodium 143 10/24/2018: Hemoglobin 13.0; Platelets 209; TSH 3.810    Lipid Panel    Component Value Date/Time   CHOL 105 08/23/2018  3846   TRIG 199.0 (H) 08/23/2018 0938   TRIG 240 (HH) 09/07/2006 0849   HDL 38.40 (L) 08/23/2018 0938   CHOLHDL 3 08/23/2018 0938   VLDL 39.8 08/23/2018 0938   LDLCALC 27 08/23/2018 0938   LDLDIRECT 50.0 10/22/2014 0841      Wt Readings from Last 3 Encounters:  05/16/19 162 lb (73.5 kg)  12/14/18 162 lb 12 oz (73.8 kg)  10/24/18 163 lb (73.9 kg)      Other studies Reviewed: Additional studies/ records that were reviewed today include: None Review of the above records demonstrates:  Please see elsewhere in the note.     ASSESSMENT AND PLAN:  ATRIAL FIB:  Ms. Tonya [name] has a CHA2DS2 - VASc score of 5 .    No change in therapy is indicated.  She tolerates anticoagulation and rate control.    CAD:  The patient has no new sypmtoms.  No further cardiovascular testing is indicated.  We will continue with aggressive risk reduction and meds as listed.  HTN: The blood pressure is at target. No change in medications is indicated. We will continue with therapeutic lifestyle changes (TLC).  CKD: Creatinine is 1.55.  She will have this followed by [name], MD    Current medicines are reviewed at length with the patient today.  The patient does not have concerns regarding medicines.  The following changes have been made:  None  Labs/ tests ordered today include:  None  No orders of the defined types were placed in this encounter.    Disposition:   FU with me in 52 weeks.     Signed,  Minus Breeding, MD  05/16/2019 2:54 PM    [facility]

## 2019-05-16 ENCOUNTER — Other Ambulatory Visit: Payer: Self-pay

## 2019-05-16 ENCOUNTER — Encounter: Payer: Self-pay | Admitting: Cardiology

## 2019-05-16 ENCOUNTER — Ambulatory Visit (INDEPENDENT_AMBULATORY_CARE_PROVIDER_SITE_OTHER): Payer: Medicare Other | Admitting: Cardiology

## 2019-05-16 VITALS — BP 122/68 | HR 89 | Temp 97.3°F | Ht 65.5 in | Wt 162.0 lb

## 2019-05-16 DIAGNOSIS — I482 Chronic atrial fibrillation, unspecified: Secondary | ICD-10-CM | POA: Diagnosis not present

## 2019-05-16 MED ORDER — LOSARTAN POTASSIUM 100 MG PO TABS: 100.0000 mg | ORAL_TABLET | Freq: Every day | ORAL | 3 refills | Status: DC

## 2019-05-16 NOTE — Patient Instructions (Signed)
Medication Instructions:  OK TO GO BACK TO THE LOSARTAN 100 MG DAILY WHEN YOU FINISH YOUR CURRENT VALSARTAN BOTTLE   If you need a refill on your cardiac medications before your next appointment, please call your pharmacy.   Lab work: NONE If you have labs (blood work) drawn today and your tests are completely normal, you will receive your results only by: Marland Kitchen MyChart Message (if you have MyChart) OR . A paper copy in the mail If you have any lab test that is abnormal or we need to change your treatment, we will call you to review the results.  Testing/Procedures: NONE  Follow-Up: At [facility], you and your health needs are our priority.  As part of our continuing mission to provide you with exceptional heart care, we have created designated Provider Care Teams.  These Care Teams include your primary Cardiologist (physician) and Advanced Practice Providers (APPs -  Physician Assistants and Nurse Practitioners) who all work together to provide you with the care you need, when you need it. You will need a follow up appointment in 12 months.  Please call our office 2 months in advance to schedule this appointment.  You may see DR [facility]  or one of the following Advanced Practice Providers on your designated Care Team:   Rosaria Ferries, PA-C . Jory Sims, DNP, ANP

## 2019-05-29 ENCOUNTER — Other Ambulatory Visit: Payer: Self-pay | Admitting: Cardiology

## 2019-05-31 ENCOUNTER — Other Ambulatory Visit: Payer: Self-pay | Admitting: Cardiology

## 2019-06-04 ENCOUNTER — Other Ambulatory Visit: Payer: Self-pay | Admitting: Family Medicine

## 2019-06-04 DIAGNOSIS — I4821 Permanent atrial fibrillation: Secondary | ICD-10-CM

## 2019-06-13 ENCOUNTER — Other Ambulatory Visit: Payer: Self-pay | Admitting: Family Medicine

## 2019-06-14 ENCOUNTER — Other Ambulatory Visit: Payer: Self-pay | Admitting: Family Medicine

## 2019-06-30 ENCOUNTER — Telehealth: Payer: Self-pay | Admitting: Family Medicine

## 2019-06-30 DIAGNOSIS — D638 Anemia in other chronic diseases classified elsewhere: Secondary | ICD-10-CM

## 2019-06-30 DIAGNOSIS — E1121 Type 2 diabetes mellitus with diabetic nephropathy: Secondary | ICD-10-CM

## 2019-06-30 NOTE — Telephone Encounter (Signed)
-----   Message from Cloyd Stagers, RT sent at 06/28/2019 10:00 AM EDT ----- Regarding: Lab Orders for Monday 10.5.2020 Please place lab orders for Monday 10.5.2020, office visit for physical on Tuesday 10.13.2020 Thank you, Timi Reeser RT(R)

## 2019-07-01 ENCOUNTER — Other Ambulatory Visit: Payer: Self-pay | Admitting: Family Medicine

## 2019-07-03 ENCOUNTER — Ambulatory Visit: Payer: Medicare Other

## 2019-07-03 ENCOUNTER — Ambulatory Visit (INDEPENDENT_AMBULATORY_CARE_PROVIDER_SITE_OTHER): Payer: Medicare Other

## 2019-07-03 ENCOUNTER — Other Ambulatory Visit (INDEPENDENT_AMBULATORY_CARE_PROVIDER_SITE_OTHER): Payer: Medicare Other

## 2019-07-03 DIAGNOSIS — Z0000 Encounter for general adult medical examination without abnormal findings: Secondary | ICD-10-CM | POA: Diagnosis not present

## 2019-07-03 DIAGNOSIS — E1121 Type 2 diabetes mellitus with diabetic nephropathy: Secondary | ICD-10-CM

## 2019-07-03 DIAGNOSIS — D638 Anemia in other chronic diseases classified elsewhere: Secondary | ICD-10-CM

## 2019-07-03 LAB — CBC WITH DIFFERENTIAL/PLATELET
Basophils Absolute: 0.1 10*3/uL (ref 0.0–0.1)
Basophils Relative: 0.7 % (ref 0.0–3.0)
Eosinophils Absolute: 0.1 10*3/uL (ref 0.0–0.7)
Eosinophils Relative: 1.2 % (ref 0.0–5.0)
HCT: 38.8 % (ref 36.0–46.0)
Hemoglobin: 13 g/dL (ref 12.0–15.0)
Lymphocytes Relative: 16.8 % (ref 12.0–46.0)
Lymphs Abs: 1.5 10*3/uL (ref 0.7–4.0)
MCHC: 33.5 g/dL (ref 30.0–36.0)
MCV: 93.1 fl (ref 78.0–100.0)
Monocytes Absolute: 0.6 10*3/uL (ref 0.1–1.0)
Monocytes Relative: 6.9 % (ref 3.0–12.0)
Neutro Abs: 6.6 10*3/uL (ref 1.4–7.7)
Neutrophils Relative %: 74.4 % (ref 43.0–77.0)
Platelets: 193 10*3/uL (ref 150.0–400.0)
RBC: 4.16 Mil/uL (ref 3.87–5.11)
RDW: 12.9 % (ref 11.5–15.5)
WBC: 8.9 10*3/uL (ref 4.0–10.5)

## 2019-07-03 LAB — LIPID PANEL
Cholesterol: 103 mg/dL (ref 0–200)
HDL: 42.7 mg/dL (ref >39.00)
LDL Cholesterol: 34 mg/dL (ref 0–99)
NonHDL: 60.47
Total CHOL/HDL Ratio: 2
Triglycerides: 130 mg/dL (ref 0.0–149.0)
VLDL: 26 mg/dL (ref 0.0–40.0)

## 2019-07-03 LAB — HEMOGLOBIN A1C: Hgb A1c MFr Bld: 6.2 % (ref 4.6–6.5)

## 2019-07-03 LAB — COMPREHENSIVE METABOLIC PANEL
ALT: 14 U/L (ref 0–35)
AST: 19 U/L (ref 0–37)
Albumin: 4.3 g/dL (ref 3.5–5.2)
Alkaline Phosphatase: 64 U/L (ref 39–117)
BUN: 28 mg/dL — ABNORMAL HIGH (ref 6–23)
CO2: 27 mEq/L (ref 19–32)
Calcium: 9 mg/dL (ref 8.4–10.5)
Chloride: 105 mEq/L (ref 96–112)
Creatinine, Ser: 1.88 mg/dL — ABNORMAL HIGH (ref 0.40–1.20)
GFR: 25.8 mL/min — ABNORMAL LOW (ref >60.00)
Glucose, Bld: 132 mg/dL — ABNORMAL HIGH (ref 70–99)
Potassium: 4.2 mEq/L (ref 3.5–5.1)
Sodium: 140 mEq/L (ref 135–145)
Total Bilirubin: 0.8 mg/dL (ref 0.2–1.2)
Total Protein: 6.6 g/dL (ref 6.0–8.3)

## 2019-07-03 NOTE — Progress Notes (Signed)
PCP notes: none  Health Maintenance: declined Shingrix    Abnormal Screenings: none    Patient concerns: Patient wants to discuss her arthritis in her wrists and fingers.     Nurse concerns: none    Next PCP appt.: 07/11/2019 @ 9:20 am

## 2019-07-03 NOTE — Progress Notes (Signed)
Subjective:   SONNET RIZOR is a 78 y.o. female who presents for Medicare Annual (Subsequent) preventive examination.  Review of Systems:    This visit is being conducted through telemedicine via telephone at the nurse health advisor's home address due to the COVID-19 pandemic. This patient has given me verbal consent via doximity to conduct this visit, patient states they are participating from their home address. Some vital signs may be absent or patient reported.    Patient identification: identified by name, DOB, and current address   Cardiac Risk Factors include: advanced age (>63mn, >>36women);diabetes mellitus;hypertension;dyslipidemia;sedentary lifestyle     Objective:     Vitals: There were no vitals taken for this visit.  There is no height or weight on file to calculate BMI.  Advanced Directives 07/03/2019 02/10/2017 05/28/2016 05/22/2016 04/27/2016 11/20/2015 04/03/2014  Does Patient Have a Medical Advance Directive? _0  No Patient does not have advance directive;Patient would not like information  Would patient like information on creating a medical advance directive? Yes (MAU/Ambulatory/Procedural Areas - Information given) - No - patient declined information No - patient declined information - No - patient declined information -  Pre-existing out of facility DNR order (yellow form or pink MOST form) - - - - - - No    Tobacco Social History   Tobacco Use  Smoking Status Never Smoker  Smokeless Tobacco Never Used     Counseling given: Not Answered   Clinical Intake:  Pre-visit preparation completed: Yes  Pain : No/denies pain     Nutritional Risks: Nausea/ vomitting/ diarrhea(has diarrhea episodes, takes medication for this) Diabetes: Yes CBG done?: No Did pt. bring in CBG monitor from home?: No  How often do you need to have someone help you when you read instructions, pamphlets, or other written materials from your doctor or pharmacy?: 1 - Never  What is the last grade level you completed in school?: some college  Interpreter Needed?: No  Information entered by :: CJohnson, LPN  Past Medical History:  Diagnosis Date  . [place]   . Anemia    iron deficiency  . Anxiety   . Carpal tunnel syndrome   . Cataract    REMOVED BILATERAL  . Chronic diarrhea   . Colon polyps    Tubular adenoma  . Degenerative disk disease   . Depression   . Diabetes mellitus, type 2 ([place]   . Dysrhythmia    h/o AFIB  . GERD (gastroesophageal reflux disease)   . Hyperlipidemia   . Hypertension   . IBS (irritable bowel syndrome)   . Osteoarthritis   . Osteoporosis   . S/P coronary artery stent placement 2007   Past Surgical History:  Procedure Laterality Date  . CARDIAC CATHETERIZATION  12/10   LAD stent patent. Insignificant CAD, otherwise EF 60-65%  . CARPAL TUNNEL RELEASE    . CATARACT EXTRACTION  03/2015  . CHOLECYSTECTOMY    . COLON SURGERY    . COLONOSCOPY    . DILATION AND CURETTAGE OF UTERUS    . HEMATOMA EVACUATION     after knee replacement  . KNEE ARTHROSCOPY     bilateral  . LUMBAR LAMINECTOMY N/A 11/26/2015   Procedure: LUMBAR DECOMPRESSIVE LAMINECTOMY L3-4 AND L4-5, LEFT L2-3 HEMILAMINECTOMY;  Surgeon: JJessy Oto MD;  [facility]  Service: Orthopedics;  Laterality: N/A;  . PARTIAL COLECTOMY  11/2008   right, for adenocarcinoma  . REPLACEMENT TOTAL KNEE  04/2010  right, complicated by hemarthrosis   . REVERSE SHOULDER ARTHROPLASTY Right 05/28/2016  . REVERSE SHOULDER ARTHROPLASTY Right 05/28/2016   Procedure: REVERSE SHOULDER ARTHROPLASTY;  Surgeon: Meredith Pel, MD;  Location: [facility];  Service: Orthopedics;  Laterality: Right;  . TONSILLECTOMY    . TOTAL KNEE ARTHROPLASTY Left 04/03/2014   dr whitfield  . TOTAL KNEE ARTHROPLASTY Left 04/03/2014   Procedure: TOTAL KNEE ARTHROPLASTY;  Surgeon: Garald Balding, MD;  Location: [facility];  Service: Orthopedics;  Laterality: Left;  . TOTAL KNEE  REVISION  03/08/2012   Procedure: TOTAL KNEE REVISION;  Surgeon: Garald Balding, MD;  Location: [facility];  Service: Orthopedics;  Laterality: Right;  removal total knee hardware and placement of antibiotic cement spacer and antibiotic beads  . TOTAL KNEE REVISION  05/17/2012   Procedure: TOTAL KNEE REVISION;  Surgeon: Garald Balding, MD;  Location: [facility];  Service: Orthopedics;  Laterality: Right;  right total knee revision, removal of antibiotic spacer   Family History  Problem Relation Age of Onset  . Heart disease Mother        massive MI age 54  . Heart attack Mother   . Heart disease Father        Massive MI age 51  . Prostate cancer Father   . Diabetes Paternal Grandmother   . Colon polyps Sister   . Colon cancer Neg Hx    Social History   Socioeconomic History  . Marital status: Divorced    Spouse name: Not on file  . Number of children: 3  . Years of education: Not on file  . Highest education level: Not on file  Occupational History  . Occupation: retired    Fish farm manager: RETIRED  Social Needs  . Financial resource strain: Not hard at all  . Food insecurity    Worry: Never true    Inability: Never true  . Transportation needs    Medical: No    Non-medical: No  Tobacco Use  . Smoking status: Never Smoker  . Smokeless tobacco: Never Used  Substance and Sexual Activity  . Alcohol use: No    Alcohol/week: 0.0 standard drinks  . Drug use: No  . Sexual activity: Never  Lifestyle  . Physical activity    Days per week: 0 days    Minutes per session: 0 min  . Stress: Not at all  Relationships  . Social Herbalist on phone: Not on file    Gets together: Not on file    Attends religious service: Not on file    Active member of club or organization: Not on file    Attends meetings of clubs or organizations: Not on file    Relationship status: Not on file  Other Topics Concern  . Not on file  Social History Narrative   No regular exercise, limited due to  knees   Diet- addicted to sweets, some fruit and veggies, water daily.    Outpatient Encounter Medications as of 07/03/2019  Medication Sig  . apixaban (ELIQUIS) 5 MG TABS tablet Take 1 tablet (5 mg total) by mouth 2 (two) times daily.  Marland Kitchen atorvastatin (LIPITOR) 20 MG tablet TAKE 1 TABLET BY MOUTH EVERY DAY  . Blood Glucose Monitoring Suppl (ACCU-CHEK AVIVA PLUS) w/Device KIT Use to check blood once sugar daily.  . calcium-vitamin D (OSCAL WITH D 500-200) 500-200 MG-UNIT per tablet Take 1 tablet by mouth 2 (two) times daily.    . carvedilol (COREG) 12.5 MG  tablet TAKE 1 TABLET (12.5 MG TOTAL) BY MOUTH 2 (TWO) TIMES DAILY WITH A MEAL.  . cetirizine (ZYRTEC) 10 MG tablet Take 10 mg by mouth daily as needed for allergies.  . cholestyramine light (PREVALITE) 4 g packet USE 1 PACKET TWICE DAILY WITH A MEAL, AT LEAST 2 HOURS AWAY FROM OTHER MEDS  . citalopram (CELEXA) 20 MG tablet TAKE 1 TABLET BY MOUTH EVERY DAY  . clindamycin (CLEOCIN) 300 MG capsule Take 2 capsules (645m) one hour prior to procedure  . diltiazem (CARDIZEM CD) 180 MG 24 hr capsule TAKE 1 CAPSULE BY MOUTH EVERY DAY  . ferrous sulfate 325 (65 FE) MG EC tablet Take 325 mg by mouth 2 (two) times daily.  .Marland KitchenglipiZIDE (GLUCOTROL) 5 MG tablet TAKE 0.5 TABLETS (2.5 MG TOTAL) BY MOUTH 2 (TWO) TIMES DAILY BEFORE A MEAL.  .Marland Kitchenglucose blood (ACCU-CHEK AVIVA PLUS) test strip Use to check blood sugar daily.  . hydrochlorothiazide (HYDRODIURIL) 25 MG tablet TAKE 1 TABLET BY MOUTH EVERY DAY **TAKE WITH LOSARTAN**  . isosorbide mononitrate (IMDUR) 30 MG 24 hr tablet TAKE 1 TABLET BY MOUTH EVERY DAY  . losartan (COZAAR) 100 MG tablet Take 1 tablet (100 mg total) by mouth daily.  .Marland Kitchennystatin cream (MYCOSTATIN) Apply 1 application topically 2 (two) times daily as needed for dry skin.  .Marland Kitchenomeprazole (PRILOSEC) 20 MG capsule TAKE 2 CAPSULES BY MOUTH EVERY DAY   No facility-administered encounter medications on file as of 07/03/2019.     Activities of  Daily Living In your present state of health, do you have any difficulty performing the following activities: 07/03/2019  Hearing? N  Vision? N  Difficulty concentrating or making decisions? N  Walking or climbing stairs? N  Dressing or bathing? N  Doing errands, shopping? N  Preparing Food and eating ? N  Using the Toilet? N  In the past six months, have you accidently leaked urine? Y  Comment wears a pad daily  Do you have problems with loss of bowel control? N  Managing your Medications? N  Managing your Finances? N  Housekeeping or managing your Housekeeping? N  Some recent data might be hidden    Patient Care Team: BJinny Sanders MD as PCP - General    Assessment:   This is a routine wellness examination for JAdan  Exercise Activities and Dietary recommendations Current Exercise Habits: The patient does not participate in regular exercise at present, Exercise limited by: None identified  Goals    . medication management (pt-stated)     Starting 02/10/2017, I will continue to take medications as prescribed.     . Patient Stated     07/03/2019, Patient wants to increase her social activity with friends and family.        Fall Risk Fall Risk  07/03/2019 03/01/2018 02/10/2017 10/29/2015 10/26/2014  Falls in the past year? 0 Yes Yes No Yes  Comment - - pt fell while playing soccer and broke right arm; medical treatment including surgery - -  Number falls in past yr: - 1 1 - 1  Injury with Fall? - - Yes - -  Comment - - - - -  Risk Factor Category  - - - - High Fall Risk  Risk for fall due to : Medication side effect - - - History of fall(s)  Follow up Falls evaluation completed;Falls prevention discussed - - - -   Is the patient's home free of loose throw rugs in walkways, pet beds, electrical  cords, etc?   yes      Grab bars in the bathroom? yes      Handrails on the stairs?   yes      Adequate lighting?   yes  Timed Get Up and Go performed: n/a  Depression Screen  PHQ 2/9 Scores 07/03/2019 03/01/2018 02/26/2017 02/10/2017  PHQ - 2 Score 0 0 0 0  PHQ- 9 Score 0 - - -     Cognitive Function MMSE - Mini Mental State Exam 07/03/2019 02/10/2017  Orientation to time 5 5  Orientation to Place 5 5  Registration 3 3  Attention/ Calculation 5 0  Recall 3 3  Language- name 2 objects - 0  Language- repeat 1 1  Language- follow 3 step command - 3  Language- read & follow direction - 0  Write a sentence - 0  Copy design - 0  Total score - 20  Mini Cog  Mini-Cog screen was completed. Maximum score is 22. A value of 0 denotes this part of the MMSE was not completed or the patient failed this part of the Mini-Cog screening.      Immunization History  Administered Date(s) Administered  . Influenza Split 06/29/2011, 07/08/2011, 06/17/2013  . Influenza Whole 07/01/2007, 06/18/2008, 07/12/2009, 07/17/2010  . Influenza, High Dose Seasonal PF 06/25/2014, 06/10/2015, 06/06/2016, 05/26/2017, 05/28/2018, 05/21/2019  . Pneumococcal Conjugate-13 10/26/2014  . Pneumococcal Polysaccharide-23 09/29/2003, 05/20/2012  . Td 07/12/2009    Qualifies for Shingles Vaccine? Yes   Screening Tests Health Maintenance  Topic Date Due  . HEMOGLOBIN A1C  02/21/2019  . FOOT EXAM  03/02/2019  . TETANUS/TDAP  07/13/2019  . OPHTHALMOLOGY EXAM  09/30/2019  . MAMMOGRAM  11/01/2019  . COLONOSCOPY  07/20/2023  . INFLUENZA VACCINE  Completed  . DEXA SCAN  Completed  . PNA vac Low Risk Adult  Completed    Cancer Screenings: Lung: Low Dose CT Chest recommended if Age 71-80 years, 30 pack-year currently smoking OR have quit w/in 15years. Patient does not qualify. Breast:  Up to date on Mammogram? Yes, completed 10/31/2018  Up to date of Bone Density/Dexa? Yes, completed 11/01/2014 Colorectal: completed 10/2202019  Additional Screenings:  Hepatitis C Screening: n/a     Plan:    Patient wants to increase social activity with family and friends.    I have personally reviewed and  noted the following in the patient's chart:   . Medical and social history . Use of alcohol, tobacco or illicit drugs  . Current medications and supplements . Functional ability and status . Nutritional status . Physical activity . Advanced directives . List of other physicians . Hospitalizations, surgeries, and ER visits in previous 12 months . Vitals . Screenings to include cognitive, depression, and falls . Referrals and appointments  In addition, I have reviewed and discussed with patient certain preventive protocols, quality metrics, and best practice recommendations. A written personalized care plan for preventive services as well as general preventive health recommendations were provided to patient.     Sharese, Manrique, LPN  [id number]

## 2019-07-03 NOTE — Progress Notes (Signed)
No critical labs need to be addressed urgently. We will discuss labs in detail at upcoming office visit.   

## 2019-07-03 NOTE — Patient Instructions (Signed)
Tonya [name] , Thank you for taking time to come for your Medicare Wellness Visit. I appreciate your ongoing commitment to your health goals. Please review the following plan we discussed and let me know if I can assist you in the future.   Screening recommendations/referrals: Colonoscopy: up to date, completed 07/19/2018 Mammogram: up to date, completed 10/31/2018 Bone Density: up to date, completed 11/01/2014 Recommended yearly ophthalmology/optometry visit for glaucoma screening and checkup Recommended yearly dental visit for hygiene and checkup  Vaccinations: Influenza vaccine: up to date, completed 05/21/2019 Pneumococcal vaccine: series completed  Tdap vaccine: up to date, completed 07/12/2009 Shingles vaccine: declined    Advanced directives: Advance directive discussed with you today. I have provided a copy for you to complete at home and have notarized. Once this is complete please bring a copy in to our office so we can scan it into your chart.  Conditions/risks identified: diabetes, hypertension, hyperlipidemia   Next appointment: 07/11/2019 @ 9:20 am   Preventive Care 65 Years and Older, Female Preventive care refers to lifestyle choices and visits with your health care provider that can promote health and wellness. What does preventive care include?  A yearly physical exam. This is also called an annual well check.  Dental exams once or twice a year.  Routine eye exams. Ask your health care provider how often you should have your eyes checked.  Personal lifestyle choices, including:  Daily care of your teeth and gums.  Regular physical activity.  Eating a healthy diet.  Avoiding tobacco and drug use.  Limiting alcohol use.  Practicing safe sex.  Taking low-dose aspirin every day.  Taking vitamin and mineral supplements as recommended by your health care provider. What happens during an annual well check? The services and screenings done by your health care  provider during your annual well check will depend on your age, overall health, lifestyle risk factors, and family history of disease. Counseling  Your health care provider may ask you questions about your:  Alcohol use.  Tobacco use.  Drug use.  Emotional well-being.  Home and relationship well-being.  Sexual activity.  Eating habits.  History of falls.  Memory and ability to understand (cognition).  Work and work Statistician.  Reproductive health. Screening  You may have the following tests or measurements:  Height, weight, and BMI.  Blood pressure.  Lipid and cholesterol levels. These may be checked every 5 years, or more frequently if you are over 24 years old.  Skin check.  Lung cancer screening. You may have this screening every year starting at age 31 if you have a 30-pack-year history of smoking and currently smoke or have quit within the past 15 years.  Fecal occult blood test (FOBT) of the stool. You may have this test every year starting at age 57.  Flexible sigmoidoscopy or colonoscopy. You may have a sigmoidoscopy every 5 years or a colonoscopy every 10 years starting at age 9.  Hepatitis C blood test.  Hepatitis B blood test.  Sexually transmitted disease (STD) testing.  Diabetes screening. This is done by checking your blood sugar (glucose) after you have not eaten for a while (fasting). You may have this done every 1-3 years.  Bone density scan. This is done to screen for osteoporosis. You may have this done starting at age 75.  Mammogram. This may be done every 1-2 years. Talk to your health care provider about how often you should have regular mammograms. Talk with your health care provider about your  test results, treatment options, and if necessary, the need for more tests. Vaccines  Your health care provider may recommend certain vaccines, such as:  Influenza vaccine. This is recommended every year.  Tetanus, diphtheria, and acellular  pertussis (Tdap, Td) vaccine. You may need a Td booster every 10 years.  Zoster vaccine. You may need this after age 27.  Pneumococcal 13-valent conjugate (PCV13) vaccine. One dose is recommended after age 37.  Pneumococcal polysaccharide (PPSV23) vaccine. One dose is recommended after age 23. Talk to your health care provider about which screenings and vaccines you need and how often you need them. This information is not intended to replace advice given to you by your health care provider. Make sure you discuss any questions you have with your health care provider. Document Released: 10/11/2015 Document Revised: 06/03/2016 Document Reviewed: 07/16/2015 Elsevier Interactive Patient Education  2017 [place] Prevention in the Home Falls can cause injuries. They can happen to people of all ages. There are many things you can do to make your home safe and to help prevent falls. What can I do on the outside of my home?  Regularly fix the edges of walkways and driveways and fix any cracks.  Remove anything that might make you trip as you walk through a door, such as a raised step or threshold.  Trim any bushes or trees on the path to your home.  Use bright outdoor lighting.  Clear any walking paths of anything that might make someone trip, such as rocks or tools.  Regularly check to see if handrails are loose or broken. Make sure that both sides of any steps have handrails.  Any raised decks and porches should have guardrails on the edges.  Have any leaves, snow, or ice cleared regularly.  Use sand or salt on walking paths during winter.  Clean up any spills in your garage right away. This includes oil or grease spills. What can I do in the bathroom?  Use night lights.  Install grab bars by the toilet and in the tub and shower. Do not use towel bars as grab bars.  Use non-skid mats or decals in the tub or shower.  If you need to sit down in the shower, use a plastic,  non-slip stool.  Keep the floor dry. Clean up any water that spills on the floor as soon as it happens.  Remove soap buildup in the tub or shower regularly.  Attach bath mats securely with double-sided non-slip rug tape.  Do not have throw rugs and other things on the floor that can make you trip. What can I do in the bedroom?  Use night lights.  Make sure that you have a light by your bed that is easy to reach.  Do not use any sheets or blankets that are too big for your bed. They should not hang down onto the floor.  Have a firm chair that has side arms. You can use this for support while you get dressed.  Do not have throw rugs and other things on the floor that can make you trip. What can I do in the kitchen?  Clean up any spills right away.  Avoid walking on wet floors.  Keep items that you use a lot in easy-to-reach places.  If you need to reach something above you, use a strong step stool that has a grab bar.  Keep electrical cords out of the way.  Do not use floor polish or wax that  makes floors slippery. If you must use wax, use non-skid floor wax.  Do not have throw rugs and other things on the floor that can make you trip. What can I do with my stairs?  Do not leave any items on the stairs.  Make sure that there are handrails on both sides of the stairs and use them. Fix handrails that are broken or loose. Make sure that handrails are as long as the stairways.  Check any carpeting to make sure that it is firmly attached to the stairs. Fix any carpet that is loose or worn.  Avoid having throw rugs at the top or bottom of the stairs. If you do have throw rugs, attach them to the floor with carpet tape.  Make sure that you have a light switch at the top of the stairs and the bottom of the stairs. If you do not have them, ask someone to add them for you. What else can I do to help prevent falls?  Wear shoes that:  Do not have high heels.  Have rubber  bottoms.  Are comfortable and fit you well.  Are closed at the toe. Do not wear sandals.  If you use a stepladder:  Make sure that it is fully opened. Do not climb a closed stepladder.  Make sure that both sides of the stepladder are locked into place.  Ask someone to hold it for you, if possible.  Clearly mark and make sure that you can see:  Any grab bars or handrails.  First and last steps.  Where the edge of each step is.  Use tools that help you move around (mobility aids) if they are needed. These include:  Canes.  Walkers.  Scooters.  Crutches.  Turn on the lights when you go into a dark area. Replace any light bulbs as soon as they burn out.  Set up your furniture so you have a clear path. Avoid moving your furniture around.  If any of your floors are uneven, fix them.  If there are any pets around you, be aware of where they are.  Review your medicines with your doctor. Some medicines can make you feel dizzy. This can increase your chance of falling. Ask your doctor what other things that you can do to help prevent falls. This information is not intended to replace advice given to you by your health care provider. Make sure you discuss any questions you have with your health care provider. Document Released: 07/11/2009 Document Revised: 02/20/2016 Document Reviewed: 10/19/2014 Elsevier Interactive Patient Education  2017 [employer].

## 2019-07-11 ENCOUNTER — Other Ambulatory Visit: Payer: Self-pay

## 2019-07-11 ENCOUNTER — Encounter: Payer: Self-pay | Admitting: Family Medicine

## 2019-07-11 ENCOUNTER — Ambulatory Visit (INDEPENDENT_AMBULATORY_CARE_PROVIDER_SITE_OTHER): Payer: Medicare Other | Admitting: Family Medicine

## 2019-07-11 VITALS — BP 120/80 | HR 115 | Temp 97.9°F | Ht 65.5 in | Wt 164.0 lb

## 2019-07-11 DIAGNOSIS — D638 Anemia in other chronic diseases classified elsewhere: Secondary | ICD-10-CM | POA: Diagnosis not present

## 2019-07-11 DIAGNOSIS — M65342 Trigger finger, left ring finger: Secondary | ICD-10-CM | POA: Insufficient documentation

## 2019-07-11 DIAGNOSIS — I10 Essential (primary) hypertension: Secondary | ICD-10-CM | POA: Diagnosis not present

## 2019-07-11 DIAGNOSIS — E782 Mixed hyperlipidemia: Secondary | ICD-10-CM

## 2019-07-11 DIAGNOSIS — M19032 Primary osteoarthritis, left wrist: Secondary | ICD-10-CM | POA: Insufficient documentation

## 2019-07-11 DIAGNOSIS — Z0000 Encounter for general adult medical examination without abnormal findings: Secondary | ICD-10-CM

## 2019-07-11 DIAGNOSIS — E1121 Type 2 diabetes mellitus with diabetic nephropathy: Secondary | ICD-10-CM | POA: Diagnosis not present

## 2019-07-11 DIAGNOSIS — E1122 Type 2 diabetes mellitus with diabetic chronic kidney disease: Secondary | ICD-10-CM

## 2019-07-11 DIAGNOSIS — F331 Major depressive disorder, recurrent, moderate: Secondary | ICD-10-CM

## 2019-07-11 DIAGNOSIS — N184 Chronic kidney disease, stage 4 (severe): Secondary | ICD-10-CM

## 2019-07-11 DIAGNOSIS — I4821 Permanent atrial fibrillation: Secondary | ICD-10-CM | POA: Diagnosis not present

## 2019-07-11 DIAGNOSIS — M19031 Primary osteoarthritis, right wrist: Secondary | ICD-10-CM | POA: Insufficient documentation

## 2019-07-11 LAB — HM DIABETES FOOT EXAM

## 2019-07-11 MED ORDER — DICLOFENAC SODIUM 1 % TD GEL: 2.0000 g | Freq: Four times a day (QID) | TRANSDERMAL | 3 refills | Status: DC

## 2019-07-11 NOTE — Assessment & Plan Note (Signed)
Well controlled. Continue current medication.  

## 2019-07-11 NOTE — Assessment & Plan Note (Addendum)
Stable at baseline GFR 23-35 Secondary to DM and HTN. Avoiding NSAIDs and nephrotoxic meds .

## 2019-07-11 NOTE — Assessment & Plan Note (Signed)
Resolved at recent check.

## 2019-07-11 NOTE — Assessment & Plan Note (Signed)
BP at goal on diltiazem, coreg, losartan HCTZ, isosorbide

## 2019-07-11 NOTE — Assessment & Plan Note (Signed)
Trial of voltaren gel

## 2019-07-11 NOTE — Assessment & Plan Note (Signed)
Rate controlled, on Eliquis for anticoagulation.  Followed by Dr. Percival Spanish.

## 2019-07-11 NOTE — Progress Notes (Signed)
Chief Complaint  Patient presents with  . Annual Exam    Part 2    History of Present Illness: HPI   The patient presents for complete physical and review of chronic health problems. He/She also has the following acute concerns today: Bilateral wrist and finger pain. Stiffness, pain.  Has trigger finger  In left 4th digit.. painful , occurring every 2-3 days.  Voltaren gel from friend helps some with wrist pain... does not help trigger. No redness, no swelling in fingers.  The patient saw a LPN or RN for medicare wellness visit.  Prevention and wellness was reviewed in detail. Note reviewed and important notes copied below.  Health Maintenance: declined Shingrix Abnormal Screenings: none Patient concerns: Patient wants to discuss her arthritis in her wrists and fingers.   07/11/19  Hypertension:  BP at goal on diltiazem, coreg, losartan HCTZ, isosorbide     BP Readings from Last 3 Encounters:  07/11/19 120/80  05/16/19 122/68  12/14/18 (!) 142/74  Using medication without problems or lightheadedness:  none Chest pain with exertion:none Edema:none Short of breath:none Average home BPs: Other issues:  Diabetes:   At goal on low dose glipizide, occ Lab Results  Component Value Date   HGBA1C 6.2 07/03/2019  Using medications without difficulties: Hypoglycemic episodes:none Hyperglycemic episodes:none Feet problems: no ulcers Blood Sugars averaging: not checking regularly eye exam within last year: yes.Marland Kitchen get records.  Elevated Cholesterol:  LDL at goal < 70 on statin. Lab Results  Component Value Date   CHOL 103 07/03/2019   HDL 42.70 07/03/2019   LDLCALC 34 07/03/2019   LDLDIRECT 50.0 10/22/2014   TRIG 130.0 07/03/2019   CHOLHDL 2 07/03/2019  Using medications without problems:none Muscle aches: none Diet compliance: good Exercise: minimal   Wt Readings from Last 3 Encounters:  07/11/19 164 lb (74.4 kg)  05/16/19 162 lb (73.5 kg)  12/14/18 162 lb 12 oz  (73.8 kg)   Other complaints: Afib rate controlled (up with coming to MD, down with sitting), on anticog ELiquis.  MDD, stable control on celexa  CKD Estimated Creatinine Clearance: 24.8 mL/min (A) (by C-G formula based on SCr of 1.88 mg/dL (H)).  Anemia, resolved.  Patient Care Team: [name], MD as PCP - General Dr. Percival Spanish Cardiology  Dr. Ardis Hughs GI  Dr. Autumn Patty  COVID 19 screen No recent travel or known exposure to [place] The patient denies respiratory symptoms of COVID 19 at this time.  The importance of social distancing was discussed today.   Review of Systems  Constitutional: Negative for chills and fever.  HENT: Negative for congestion and ear pain.   Eyes: Negative for pain and redness.  Respiratory: Negative for cough and shortness of breath.   Cardiovascular: Negative for chest pain, palpitations and leg swelling.  Gastrointestinal: Negative for abdominal pain, blood in stool, constipation, diarrhea, nausea and vomiting.  Genitourinary: Negative for dysuria.  Musculoskeletal: Negative for falls and myalgias.  Skin: Negative for rash.  Neurological: Negative for dizziness.  Psychiatric/Behavioral: Negative for depression. The patient is not nervous/anxious.       Past Medical History:  Diagnosis Date  . Adenocarcinoma, colon ([place])   . Anemia    iron deficiency  . Anxiety   . Carpal tunnel syndrome   . Cataract    REMOVED BILATERAL  . Chronic diarrhea   . Colon polyps    Tubular adenoma  . Degenerative disk disease   . Depression   . Diabetes mellitus, type 2 ([place])   .  Dysrhythmia    h/o AFIB  . GERD (gastroesophageal reflux disease)   . Hyperlipidemia   . Hypertension   . IBS (irritable bowel syndrome)   . Osteoarthritis   . Osteoporosis   . S/P coronary artery stent placement 2007    reports that she has never smoked. She has never used smokeless tobacco. She reports that she does not drink alcohol or use drugs.   Current  Outpatient Medications:  .  apixaban (ELIQUIS) 5 MG TABS tablet, Take 1 tablet (5 mg total) by mouth 2 (two) times daily., Disp: 60 tablet, Rfl: 11 .  atorvastatin (LIPITOR) 20 MG tablet, TAKE 1 TABLET BY MOUTH EVERY DAY, Disp: 90 tablet, Rfl: 3 .  Blood Glucose Monitoring Suppl (ACCU-CHEK AVIVA PLUS) w/Device KIT, Use to check blood once sugar daily., Disp: 1 kit, Rfl: 0 .  calcium-vitamin D (OSCAL WITH D 500-200) 500-200 MG-UNIT per tablet, Take 1 tablet by mouth 2 (two) times daily.  , Disp: , Rfl:  .  carvedilol (COREG) 12.5 MG tablet, TAKE 1 TABLET (12.5 MG TOTAL) BY MOUTH 2 (TWO) TIMES DAILY WITH A MEAL., Disp: 180 tablet, Rfl: 0 .  cetirizine (ZYRTEC) 10 MG tablet, Take 10 mg by mouth daily as needed for allergies., Disp: , Rfl:  .  cholestyramine light (PREVALITE) 4 g packet, USE 1 PACKET TWICE DAILY WITH A MEAL, AT LEAST 2 HOURS AWAY FROM OTHER MEDS, Disp: 180 packet, Rfl: 4 .  citalopram (CELEXA) 20 MG tablet, TAKE 1 TABLET BY MOUTH EVERY DAY, Disp: 90 tablet, Rfl: 0 .  clindamycin (CLEOCIN) 300 MG capsule, Take 2 capsules ('600mg'$ ) one hour prior to procedure, Disp: 10 capsule, Rfl: 0 .  diltiazem (CARDIZEM CD) 180 MG 24 hr capsule, TAKE 1 CAPSULE BY MOUTH EVERY DAY, Disp: 90 capsule, Rfl: 0 .  ferrous sulfate 325 (65 FE) MG EC tablet, Take 325 mg by mouth 2 (two) times daily., Disp: , Rfl:  .  glipiZIDE (GLUCOTROL) 5 MG tablet, TAKE 0.5 TABLETS (2.5 MG TOTAL) BY MOUTH 2 (TWO) TIMES DAILY BEFORE A MEAL., Disp: 90 tablet, Rfl: 0 .  glucose blood (ACCU-CHEK AVIVA PLUS) test strip, Use to check blood sugar daily., Disp: 100 each, Rfl: 3 .  hydrochlorothiazide (HYDRODIURIL) 25 MG tablet, TAKE 1 TABLET BY MOUTH EVERY DAY **TAKE WITH LOSARTAN**, Disp: 90 tablet, Rfl: 3 .  isosorbide mononitrate (IMDUR) 30 MG 24 hr tablet, TAKE 1 TABLET BY MOUTH EVERY DAY, Disp: 90 tablet, Rfl: 3 .  losartan (COZAAR) 100 MG tablet, Take 1 tablet (100 mg total) by mouth daily., Disp: 90 tablet, Rfl: 3 .  nystatin  cream (MYCOSTATIN), Apply 1 application topically 2 (two) times daily as needed for dry skin., Disp: 30 g, Rfl: 1 .  omeprazole (PRILOSEC) 20 MG capsule, TAKE 2 CAPSULES BY MOUTH EVERY DAY, Disp: 180 capsule, Rfl: 0   Observations/Objective: Blood pressure 120/80, pulse (!) 115, temperature 97.9 F (36.6 C), temperature source Temporal, height 5' 5.5" (1.664 m), weight 164 lb (74.4 kg), SpO2 96 %.  Physical Exam Constitutional:      General: She is not in acute distress.    Appearance: Normal appearance. She is well-developed. She is not ill-appearing or toxic-appearing.  HENT:     Head: Normocephalic.     Right Ear: Hearing, tympanic membrane, ear canal and external ear normal.     Left Ear: Hearing, tympanic membrane, ear canal and external ear normal.     Nose: Nose normal.  Eyes:  General: Lids are normal. Lids are everted, no foreign bodies appreciated.     Conjunctiva/sclera: Conjunctivae normal.     Pupils: Pupils are equal, round, and reactive to light.  Neck:     Musculoskeletal: Normal range of motion and neck supple.     Thyroid: No thyroid mass or thyromegaly.     Vascular: No carotid bruit.     Trachea: Trachea normal.  Cardiovascular:     Rate and Rhythm: Normal rate and regular rhythm.     Heart sounds: Normal heart sounds, S1 normal and S2 normal. No murmur. No gallop.   Pulmonary:     Effort: Pulmonary effort is normal. No respiratory distress.     Breath sounds: Normal breath sounds. No wheezing, rhonchi or rales.  Abdominal:     General: Bowel sounds are normal. There is no distension or abdominal bruit.     Palpations: Abdomen is soft. There is no fluid wave or mass.     Tenderness: There is no abdominal tenderness. There is no guarding or rebound.     Hernia: No hernia is present.  Musculoskeletal:     Comments:  OA deformity in wrists and fingers bilaterally.  Pain at base of left 4th digit, no triggering in office  Lymphadenopathy:     Cervical: No  cervical adenopathy.  Skin:    General: Skin is warm and dry.     Findings: No rash.  Neurological:     Mental Status: She is alert.     Cranial Nerves: No cranial nerve deficit.     Sensory: No sensory deficit.  Psychiatric:        Mood and Affect: Mood is not anxious or depressed.        Speech: Speech normal.        Behavior: Behavior normal. Behavior is cooperative.        Judgment: Judgment normal.     Diabetic foot exam: Normal inspection No skin breakdown No calluses  Normal DP pulses Normal sensation to light touch and monofilament Nails normal  Assessment and Plan   The patient's preventative maintenance and recommended screening tests for an annual wellness exam were reviewed in full today. Brought up to date unless services declined.  Counselled on the importance of diet, exercise, and its role in overall health and mortality. The patient's FH and SH was reviewed, including their home life, tobacco status, and drug and alcohol status.   DEXA 10/2014 normal. Repeat in 5 years, 2021  Mammogram nml in 10/2018 Uptodate with colonoscopy with hx of colon cancer.. 06/2018 with Rande Brunt repeat in 3 years. PAP/DVE not indicated at this age. No vaginal bleeding.  Nonsmoker  Vaccine: uptodate except for shingles  Essential hypertension, benign BP at goal on diltiazem, coreg, losartan HCTZ, isosorbide     Controlled type 2 diabetes mellitus with diabetic nephropathy ([place]) Well controlled. Continue current medication.   Hyperlipidemia LDL at goal < 70 on statin.  Major depressive disorder, recurrent episode, moderate (H[facility]) Well controlled. Continue current medication.   Atrial fibrillation ([place])  Rate controlled, on Eliquis for anticoagulation.  Followed by Dr. Percival Spanish.  CKD stage 4 due to type 2 diabetes mellitus (H[facility])  Stable at baseline GFR 23-35 Secondary to DM and HTN. Avoiding NSAIDs and nephrotoxic meds .  Anemia of chronic disease  Resolved at  recent check.  Acquired trigger finger of left ring finger Refer to SM for possible steroid injection of trigger finger.  Osteoarthritis of both wrists  Trial  of voltaren gel.    [name], MD

## 2019-07-11 NOTE — Assessment & Plan Note (Signed)
Refer to [facility] for possible steroid injection of trigger finger.

## 2019-07-11 NOTE — Patient Instructions (Addendum)
Stop at front desk to set up sports medicine appt with Dr. Lorelei Pont... for trigger finger in left 4th digit.  Get back to regular exercise, walking as able.  Can use voltaren gel on wrists and fingers.

## 2019-07-11 NOTE — Assessment & Plan Note (Signed)
LDL at goal < 70 on statin.

## 2019-07-12 ENCOUNTER — Ambulatory Visit (INDEPENDENT_AMBULATORY_CARE_PROVIDER_SITE_OTHER): Payer: Medicare Other | Admitting: Family Medicine

## 2019-07-12 ENCOUNTER — Encounter: Payer: Self-pay | Admitting: Family Medicine

## 2019-07-12 VITALS — BP 124/70 | HR 79 | Temp 97.6°F | Ht 65.5 in | Wt 165.0 lb

## 2019-07-12 DIAGNOSIS — M65342 Trigger finger, left ring finger: Secondary | ICD-10-CM | POA: Diagnosis not present

## 2019-07-12 MED ORDER — METHYLPREDNISOLONE ACETATE 40 MG/ML IJ SUSP
20.0000 mg | Freq: Once | INTRAMUSCULAR | Status: AC
  Administered 2019-07-12: 20 mg via INTRA_ARTICULAR

## 2019-07-12 NOTE — Progress Notes (Signed)
Tonya Casebeer T. [name], MD Primary Care and [place] at [facility] [place], [address] Phone: [phone]  FAX: [place] - 78 y.o. female  MRN [id number]  Date of Birth: 01-19-1941  Visit Date: 07/12/2019  PCP: [name], MD  Referred by: [name], MD  Chief Complaint  Patient presents with  . Trigger Finger    Left Ring Finger    The patient is here for procedure only.  Left-sided trigger finger of the fourth digit.  Tendon Sheath Injection Procedure Note BRITTINY [name] August 19, 1941 Date of procedure: 07/12/2019  Procedure: Tendon Sheath Injection for Trigger Finger, left Indications: Pain  Procedure Details Verbal consent was obtained. Risks (including rare risk of infection, potential risk for skin lightening and potential atrophy), benefits and alternatives were discussed. Prepped with Chloraprep and Ethyl Chloride used for anesthesia. Under sterile conditions, patient injected at palmar crease aiming distally with 45 degree angle towards nodule; injected directly into tendon sheath. Medication flowed freely without resistance.  Needle size: 22 gauge 1 1/2 inch Injection: 1/2 cc of Lidocaine 1% and Depo-Medrol 20 mg Medication: Depo-Medrol 20 mg     ICD-10-CM   1. Acquired trigger finger of left ring finger  M65.342      Follow-up: No follow-ups on file.  No orders of the defined types were placed in this encounter.  No orders of the defined types were placed in this encounter.   Signed,  [name]. [name], MD   Outpatient Encounter Medications as of 07/12/2019  Medication Sig  . apixaban (ELIQUIS) 5 MG TABS tablet Take 1 tablet (5 mg total) by mouth 2 (two) times daily.  Marland Kitchen atorvastatin (LIPITOR) 20 MG tablet TAKE 1 TABLET BY MOUTH EVERY DAY  . Blood Glucose Monitoring Suppl (ACCU-CHEK AVIVA PLUS) w/Device KIT Use to check blood once sugar daily.  . calcium-vitamin  D (OSCAL WITH D 500-200) 500-200 MG-UNIT per tablet Take 1 tablet by mouth 2 (two) times daily.    . carvedilol (COREG) 12.5 MG tablet TAKE 1 TABLET (12.5 MG TOTAL) BY MOUTH 2 (TWO) TIMES DAILY WITH A MEAL.  . cetirizine (ZYRTEC) 10 MG tablet Take 10 mg by mouth daily as needed for allergies.  . cholestyramine light (PREVALITE) 4 g packet USE 1 PACKET TWICE DAILY WITH A MEAL, AT LEAST 2 HOURS AWAY FROM OTHER MEDS  . citalopram (CELEXA) 20 MG tablet TAKE 1 TABLET BY MOUTH EVERY DAY  . clindamycin (CLEOCIN) 300 MG capsule Take 2 capsules ('600mg'$ ) one hour prior to procedure  . diclofenac sodium (VOLTAREN) 1 % GEL Apply 2 g topically 4 (four) times daily.  Marland Kitchen diltiazem (CARDIZEM CD) 180 MG 24 hr capsule TAKE 1 CAPSULE BY MOUTH EVERY DAY  . ferrous sulfate 325 (65 FE) MG EC tablet Take 325 mg by mouth 2 (two) times daily.  Marland Kitchen glipiZIDE (GLUCOTROL) 5 MG tablet TAKE 0.5 TABLETS (2.5 MG TOTAL) BY MOUTH 2 (TWO) TIMES DAILY BEFORE A MEAL.  Marland Kitchen glucose blood (ACCU-CHEK AVIVA PLUS) test strip Use to check blood sugar daily.  . hydrochlorothiazide (HYDRODIURIL) 25 MG tablet TAKE 1 TABLET BY MOUTH EVERY DAY **TAKE WITH LOSARTAN**  . isosorbide mononitrate (IMDUR) 30 MG 24 hr tablet TAKE 1 TABLET BY MOUTH EVERY DAY  . losartan (COZAAR) 100 MG tablet Take 1 tablet (100 mg total) by mouth daily.  Marland Kitchen nystatin cream (MYCOSTATIN) Apply 1 application topically 2 (two) times daily as needed for  dry skin.  Marland Kitchen omeprazole (PRILOSEC) 20 MG capsule TAKE 2 CAPSULES BY MOUTH EVERY DAY   No facility-administered encounter medications on file as of 07/12/2019.

## 2019-07-12 NOTE — Addendum Note (Signed)
Addended by: Carter Kitten on: 07/12/2019 11:09 AM   Modules accepted: Orders

## 2019-07-27 ENCOUNTER — Other Ambulatory Visit: Payer: Self-pay | Admitting: Family Medicine

## 2019-09-09 ENCOUNTER — Other Ambulatory Visit: Payer: Self-pay | Admitting: Family Medicine

## 2019-09-09 DIAGNOSIS — I4821 Permanent atrial fibrillation: Secondary | ICD-10-CM

## 2019-09-12 ENCOUNTER — Other Ambulatory Visit: Payer: Self-pay | Admitting: Family Medicine

## 2019-09-12 DIAGNOSIS — H02834 Dermatochalasis of left upper eyelid: Secondary | ICD-10-CM | POA: Diagnosis not present

## 2019-09-12 DIAGNOSIS — H43813 Vitreous degeneration, bilateral: Secondary | ICD-10-CM | POA: Diagnosis not present

## 2019-09-12 DIAGNOSIS — E119 Type 2 diabetes mellitus without complications: Secondary | ICD-10-CM | POA: Diagnosis not present

## 2019-09-12 DIAGNOSIS — H40013 Open angle with borderline findings, low risk, bilateral: Secondary | ICD-10-CM | POA: Diagnosis not present

## 2019-09-12 DIAGNOSIS — H02831 Dermatochalasis of right upper eyelid: Secondary | ICD-10-CM | POA: Diagnosis not present

## 2019-09-12 LAB — HM DIABETES EYE EXAM

## 2019-09-18 ENCOUNTER — Encounter: Payer: Self-pay | Admitting: Family Medicine

## 2019-10-01 ENCOUNTER — Other Ambulatory Visit: Payer: Self-pay | Admitting: Family Medicine

## 2019-10-02 ENCOUNTER — Other Ambulatory Visit: Payer: Self-pay | Admitting: Family Medicine

## 2019-10-02 DIAGNOSIS — Z1231 Encounter for screening mammogram for malignant neoplasm of breast: Secondary | ICD-10-CM

## 2019-10-13 ENCOUNTER — Other Ambulatory Visit: Payer: Self-pay

## 2019-10-13 MED ORDER — ISOSORBIDE MONONITRATE ER 30 MG PO TB24: 30.0000 mg | ORAL_TABLET | Freq: Every day | ORAL | 2 refills | Status: DC

## 2019-11-10 ENCOUNTER — Ambulatory Visit
Admission: RE | Admit: 2019-11-10 | Discharge: 2019-11-10 | Disposition: A | Discharge status: Home / Self Care | Payer: Medicare Other | Source: Ambulatory Visit | Attending: Family Medicine | Admitting: Family Medicine

## 2019-11-10 ENCOUNTER — Other Ambulatory Visit: Payer: Self-pay

## 2019-11-10 DIAGNOSIS — Z1231 Encounter for screening mammogram for malignant neoplasm of breast: Secondary | ICD-10-CM

## 2019-12-04 ENCOUNTER — Other Ambulatory Visit: Payer: Self-pay

## 2019-12-04 MED ORDER — CHOLESTYRAMINE LIGHT 4 G PO PACK: PACK | ORAL | 0 refills | Status: DC

## 2019-12-07 ENCOUNTER — Telehealth: Payer: Self-pay | Admitting: Family Medicine

## 2019-12-07 NOTE — Chronic Care Management (AMB) (Signed)
  Chronic Care Management   Note  12/07/2019 Name: Tonya [name] MRN: [id number] DOB: 06-23-1941  Tonya [name] is a 79 y.o. year old female who is a primary care patient of Bedsole, Amy E, MD. I reached out to [name] by phone today in response to a referral sent by [name]'s PCP, [name], MD.   [name] was given information about Chronic Care Management services today including:  1. CCM service includes personalized support from designated clinical staff supervised by her physician, including individualized plan of care and coordination with other care providers 2. 24/7 contact phone numbers for assistance for urgent and routine care needs. 3. Service will only be billed when office clinical staff spend 20 minutes or more in a month to coordinate care. 4. Only one practitioner may furnish and bill the service in a calendar month. 5. The patient may stop CCM services at any time (effective at the end of the month) by phone call to the office staff.   Patient agreed to services and verbal consent obtained.   Follow up plan:  [name] [employer] Scheduler

## 2019-12-07 NOTE — Progress Notes (Signed)
°  Chronic Care Management   Outreach Note  12/07/2019 Name: Tonya [name] MRN: [id number] DOB: [birth year]  Referred by: [name], MD Reason for referral : No chief complaint on file.   An unsuccessful telephone outreach was attempted today. The patient was referred to the pharmacist for assistance with care management and care coordination.   Follow Up Plan:   [name] [employer] Scheduler

## 2019-12-13 ENCOUNTER — Ambulatory Visit: Payer: Medicare Other | Admitting: Orthopaedic Surgery

## 2019-12-13 ENCOUNTER — Other Ambulatory Visit: Payer: Self-pay

## 2019-12-13 ENCOUNTER — Ambulatory Visit: Payer: Self-pay

## 2019-12-13 ENCOUNTER — Encounter: Payer: Self-pay | Admitting: Orthopaedic Surgery

## 2019-12-13 VITALS — Ht 65.5 in | Wt 164.0 lb

## 2019-12-13 DIAGNOSIS — M722 Plantar fascial fibromatosis: Secondary | ICD-10-CM | POA: Diagnosis not present

## 2019-12-13 DIAGNOSIS — M79672 Pain in left foot: Secondary | ICD-10-CM | POA: Diagnosis not present

## 2019-12-13 MED ORDER — METHYLPREDNISOLONE ACETATE 40 MG/ML IJ SUSP
40.0000 mg | INTRAMUSCULAR | Status: AC | PRN
  Administered 2019-12-13: 10:00:00 40 mg

## 2019-12-13 MED ORDER — LIDOCAINE HCL 1 % IJ SOLN
1.0000 mL | INTRAMUSCULAR | Status: AC | PRN
  Administered 2019-12-13: 1 mL

## 2019-12-13 NOTE — Progress Notes (Signed)
Office Visit Note   Patient: Tonya [name]           Date of Birth: [birth year]           MRN: [id number] Visit Date: 12/13/2019              Requested by: [name], MD [place],  [place] [address] PCP: [name], MD   Assessment & Plan: Visit Diagnoses:  1. Pain in left foot   2. Plantar fasciitis of left foot     Plan: Symptoms are consistent with plantar fasciitis left foot.  Long discussion regarding treatment option including ice or heat, anti-inflammatory medicines, stretching, boot immobilization or cortisone injection.  Mrs. Wiginton would like to try the cortisone we will try this and see what happens over the next 3 to 4 weeks.  I have instructed her on using ice and stretching at the end of the day  Follow-Up Instructions: Return if symptoms worsen or fail to improve.   Orders:  Orders Placed This Encounter  Procedures  . Foot Inj  . XR Foot 2 Views Left   No orders of the defined types were placed in this encounter.     Procedures: Foot Inj  Date/Time: 12/13/2019 10:03 AM Performed by: Garald Balding, MD Authorized by: Garald Balding, MD   Consent Given by:  Patient Condition: Plantar Fasciitis   Location: left plantar fascia muscle   Needle Size:  27 G Approach:  Plantar Medications:  1 mL lidocaine 1 %; 40 mg methylPREDNISolone acetate 40 MG/ML     Clinical Data: No additional findings.   Subjective: Chief Complaint  Patient presents with  . Left Foot - Pain  Patient presents today for left heel pain. She said that it has been hurting for 3 months with no injury. The pain is constant. She states that it aches at night. She is not taking anything for it.  HPI  Review of Systems   Objective: Vital Signs: Ht 5' 5.5" (1.664 m)   Wt 164 lb (74.4 kg)   BMI 26.88 kg/m   Physical Exam Constitutional:      Appearance: She is well-developed.  Eyes:     Pupils: Pupils are equal, round, and reactive to  light.  Pulmonary:     Effort: Pulmonary effort is normal.  Skin:    General: Skin is warm and dry.  Neurological:     Mental Status: She is alert and oriented to person, place, and time.  Psychiatric:        Behavior: Behavior normal.     Ortho Exam awake alert and oriented x3.  Comfortable sitting.  Examination left heel reveals tenderness over the plantar fascial insertion on the medial aspect of the os calcis.  Skin intact.  Johnella Moloney was not tight nor painful.  No ankle pain no loss of motion.  Sensory exam intact.  Good capillary refill to toes.  Specialty Comments:  No specialty comments available.  Imaging: XR Foot 2 Views Left  Result Date: 12/13/2019 Films of left foot were obtained in 2 projections.  There are both plantar and posterior os calcis spurring.  There also is some mild ectopic calcification on the plantar surface of the os calcis.  No obvious fracture.  Patient is exhibiting symptoms consistent with plantar fasciitis    PMFS History: Patient Active Problem List   Diagnosis Date Noted  . Plantar fasciitis of left foot 12/13/2019  . Acquired trigger  finger of left ring finger 07/11/2019  . Osteoarthritis of both wrists 07/11/2019  . Coronary artery disease involving native coronary artery of native heart without angina pectoris 10/24/2018  . Senile purpura ([place]) 03/01/2018  . History of total replacement of right shoulder joint 11/06/2016  . Anemia of chronic disease 11/29/2015  . Spinal stenosis, lumbar region, with neurogenic claudication 11/26/2015    Class: Chronic  . Mixed incontinence urge and stress 10/29/2015  . CKD stage 4 due to type 2 diabetes mellitus ([place]) 06/25/2015  . Counseling regarding end of life decision making 10/26/2014  . S/P total knee replacement using cement 04/03/2014  . Atrial fibrillation ([place]) 10/17/2010  . Allergic rhinitis 01/23/2009  . ADENOCARCINOMA, COLON, CECUM 10/05/2008  . Coronary atherosclerosis 11/23/2007  .  Hyperlipidemia 09/19/2007  . ANXIETY 09/19/2007  . Major depressive disorder, recurrent episode, moderate ([place]) 09/19/2007  . GERD 09/19/2007  . Controlled type 2 diabetes mellitus with diabetic nephropathy ([place]) 03/21/2007  . SYNDROME, CARPAL TUNNEL 03/21/2007  . Essential hypertension, benign 03/21/2007  . IBS 03/21/2007  . Osteoarthrosis, unspecified whether generalized or localized, unspecified site 03/21/2007   Past Medical History:  Diagnosis Date  . Adenocarcinoma, colon ([place])   . Anemia    iron deficiency  . Anxiety   . Carpal tunnel syndrome   . Cataract    REMOVED BILATERAL  . Chronic diarrhea   . Colon polyps    Tubular adenoma  . Degenerative disk disease   . Depression   . Diabetes mellitus, type 2 ([place])   . Dysrhythmia    h/o AFIB  . GERD (gastroesophageal reflux disease)   . Hyperlipidemia   . Hypertension   . IBS (irritable bowel syndrome)   . Osteoarthritis   . Osteoporosis   . S/P coronary artery stent placement 2007    Family History  Problem Relation Age of Onset  . Heart disease Mother        massive MI age 75  . Heart attack Mother   . Heart disease Father        Massive MI age 45  . Prostate cancer Father   . Diabetes Paternal Grandmother   . Colon polyps Sister   . Colon cancer Neg Hx     Past Surgical History:  Procedure Laterality Date  . CARDIAC CATHETERIZATION  12/10   LAD stent patent. Insignificant CAD, otherwise EF 60-65%  . CARPAL TUNNEL RELEASE    . CATARACT EXTRACTION  03/2015  . CHOLECYSTECTOMY    . COLON SURGERY    . COLONOSCOPY    . DILATION AND CURETTAGE OF UTERUS    . HEMATOMA EVACUATION     after knee replacement  . KNEE ARTHROSCOPY     bilateral  . LUMBAR LAMINECTOMY N/A 11/26/2015   Procedure: LUMBAR DECOMPRESSIVE LAMINECTOMY L3-4 AND L4-5, LEFT L2-3 HEMILAMINECTOMY;  Surgeon: Jessy Oto, MD;  Location: [facility];  Service: Orthopedics;  Laterality: N/A;  . PARTIAL COLECTOMY  11/2008   right, for adenocarcinoma  .  REPLACEMENT TOTAL KNEE  12/100   right, complicated by hemarthrosis   . REVERSE SHOULDER ARTHROPLASTY Right 05/28/2016  . REVERSE SHOULDER ARTHROPLASTY Right 05/28/2016   Procedure: REVERSE SHOULDER ARTHROPLASTY;  Surgeon: Meredith Pel, MD;  Location: [facility];  Service: Orthopedics;  Laterality: Right;  . TONSILLECTOMY    . TOTAL KNEE ARTHROPLASTY Left 04/03/2014   dr Mitzie Marlar  . TOTAL KNEE ARTHROPLASTY Left 04/03/2014   Procedure: TOTAL KNEE ARTHROPLASTY;  Surgeon: Garald Balding, MD;  Location: [facility];  Service: Orthopedics;  Laterality: Left;  . TOTAL KNEE REVISION  03/08/2012   Procedure: TOTAL KNEE REVISION;  Surgeon: Garald Balding, MD;  Location: [facility];  Service: Orthopedics;  Laterality: Right;  removal total knee hardware and placement of antibiotic cement spacer and antibiotic beads  . TOTAL KNEE REVISION  05/17/2012   Procedure: TOTAL KNEE REVISION;  Surgeon: Garald Balding, MD;  Location: [facility];  Service: Orthopedics;  Laterality: Right;  right total knee revision, removal of antibiotic spacer   Social History   Occupational History  . Occupation: retired    Fish farm manager: RETIRED  Tobacco Use  . Smoking status: Never Smoker  . Smokeless tobacco: Never Used  Substance and Sexual Activity  . Alcohol use: No    Alcohol/week: 0.0 standard drinks  . Drug use: No  . Sexual activity: Never

## 2019-12-18 ENCOUNTER — Telehealth: Payer: Self-pay

## 2019-12-18 DIAGNOSIS — I10 Essential (primary) hypertension: Secondary | ICD-10-CM

## 2019-12-18 DIAGNOSIS — I4891 Unspecified atrial fibrillation: Secondary | ICD-10-CM

## 2019-12-18 NOTE — Telephone Encounter (Signed)
I would like to request a referral for Tonya [name] to chronic care management pharmacy services for the following conditions:   Essential hypertension, benign  [I10]  Atrial fibrillation ([place]) [I48.91]  [name], PharmD Clinical Pharmacist [facility] [phone]

## 2019-12-18 NOTE — Telephone Encounter (Signed)
Please sign referral

## 2019-12-20 ENCOUNTER — Ambulatory Visit: Payer: Medicare Other

## 2019-12-20 ENCOUNTER — Other Ambulatory Visit: Payer: Self-pay

## 2019-12-20 DIAGNOSIS — N184 Chronic kidney disease, stage 4 (severe): Secondary | ICD-10-CM

## 2019-12-20 DIAGNOSIS — N3946 Mixed incontinence: Secondary | ICD-10-CM

## 2019-12-20 DIAGNOSIS — E782 Mixed hyperlipidemia: Secondary | ICD-10-CM

## 2019-12-20 DIAGNOSIS — E1122 Type 2 diabetes mellitus with diabetic chronic kidney disease: Secondary | ICD-10-CM

## 2019-12-20 DIAGNOSIS — D638 Anemia in other chronic diseases classified elsewhere: Secondary | ICD-10-CM

## 2019-12-20 DIAGNOSIS — K219 Gastro-esophageal reflux disease without esophagitis: Secondary | ICD-10-CM

## 2019-12-20 DIAGNOSIS — F331 Major depressive disorder, recurrent, moderate: Secondary | ICD-10-CM

## 2019-12-20 DIAGNOSIS — K589 Irritable bowel syndrome without diarrhea: Secondary | ICD-10-CM

## 2019-12-20 DIAGNOSIS — J309 Allergic rhinitis, unspecified: Secondary | ICD-10-CM

## 2019-12-20 DIAGNOSIS — I10 Essential (primary) hypertension: Secondary | ICD-10-CM

## 2019-12-20 DIAGNOSIS — E1121 Type 2 diabetes mellitus with diabetic nephropathy: Secondary | ICD-10-CM

## 2019-12-20 DIAGNOSIS — I482 Chronic atrial fibrillation, unspecified: Secondary | ICD-10-CM

## 2019-12-20 DIAGNOSIS — I2510 Atherosclerotic heart disease of native coronary artery without angina pectoris: Secondary | ICD-10-CM

## 2019-12-20 NOTE — Chronic Care Management (AMB) (Signed)
Chronic Care Management Pharmacy  Name: Tonya [name]  MRN: [id number] DOB: [birth year]  Chief Complaint/ HPI  Tonya [name],  79 y.o., female presents for their Initial CCM visit with the clinical pharmacist via telephone.  PCP : [name], MD  Their chronic conditions include: hypertension, hyperlipidemia, type 2 diabetes, atrial fibrillation, coronary artery disease, allergic rhinitis, GERD, chronic kidney disease, osteoarthritis, anxiety, depression  Patient concerns: omeprazole 20 mg - 1 tablet daily (not taking any right now, would like to know if she can discontinue); plantar fasciitis slowly starting to improve, extra padding in heel of shoe; using diclofenac gel for wrist and fingers    Office Visits:  07/11/19: trial Voltaren gel for OA in wrist, continue current meds, shingles vaccine recommended  Consult Visit:  12/13/19: Pain in left foot, plantar fasciitis - cortisone injection, ice, stretching   09/12/19: Eye exam  07/12/19: trigger finger - steroid injection  Allergies  Allergen Reactions  . Amoxicillin-Pot Clavulanate Swelling and Other (See Comments)    Augmentin per patient, tongue swelling   Medications: Outpatient Encounter Medications as of 12/20/2019  Medication Sig  . atorvastatin (LIPITOR) 20 MG tablet TAKE 1 TABLET BY MOUTH EVERY DAY  . Blood Glucose Monitoring Suppl (ACCU-CHEK AVIVA PLUS) w/Device KIT Use to check blood once sugar daily.  . calcium-vitamin D (OSCAL WITH D 500-200) 500-200 MG-UNIT per tablet Take 1 tablet by mouth 2 (two) times daily.    . carvedilol (COREG) 12.5 MG tablet TAKE 1 TABLET (12.5 MG TOTAL) BY MOUTH 2 (TWO) TIMES DAILY WITH A MEAL.  . cetirizine (ZYRTEC) 10 MG tablet Take 10 mg by mouth daily as needed for allergies.  . cholestyramine light (PREVALITE) 4 g packet USE 1 PACKET TWICE DAILY WITH A MEAL, AT LEAST 2 HOURS AWAY FROM OTHER MEDS  . citalopram (CELEXA) 20 MG tablet TAKE 1 TABLET BY MOUTH EVERY DAY  .  clindamycin (CLEOCIN) 300 MG capsule Take 2 capsules ('600mg'$ ) one hour prior to procedure  . diclofenac sodium (VOLTAREN) 1 % GEL Apply 2 g topically 4 (four) times daily.  Marland Kitchen diltiazem (CARDIZEM CD) 180 MG 24 hr capsule TAKE 1 CAPSULE BY MOUTH EVERY DAY  . ELIQUIS 5 MG TABS tablet TAKE 1 TABLET BY MOUTH TWICE A DAY  . ferrous sulfate 325 (65 FE) MG EC tablet Take 325 mg by mouth 2 (two) times daily.  Marland Kitchen glipiZIDE (GLUCOTROL) 5 MG tablet TAKE 0.5 TABLETS (2.5 MG TOTAL) BY MOUTH 2 (TWO) TIMES DAILY BEFORE A MEAL.  Marland Kitchen glucose blood (ACCU-CHEK AVIVA PLUS) test strip Use to check blood sugar daily.  . hydrochlorothiazide (HYDRODIURIL) 25 MG tablet TAKE 1 TABLET BY MOUTH EVERY DAY **TAKE WITH LOSARTAN**  . isosorbide mononitrate (IMDUR) 30 MG 24 hr tablet Take 1 tablet (30 mg total) by mouth daily.  Marland Kitchen losartan (COZAAR) 100 MG tablet Take 1 tablet (100 mg total) by mouth daily.  Marland Kitchen nystatin cream (MYCOSTATIN) Apply 1 application topically 2 (two) times daily as needed for dry skin.  Marland Kitchen omeprazole (PRILOSEC) 20 MG capsule TAKE 2 CAPSULES BY MOUTH EVERY DAY   No facility-administered encounter medications on file as of 12/20/2019.   Current Diagnosis/Assessment: Goals    . medication management (pt-stated)     Starting 02/10/2017, I will continue to take medications as prescribed.     . Patient Stated     07/03/2019, Patient wants to increase her social activity with friends and family.     Marland Kitchen Pharmacy Care Plan  CARE PLAN ENTRY  Current Barriers:  . Chronic Disease Management support, education, and care coordination needs related to hypertension, hyperlipidemia, type 2 diabetes, atrial fibrillation, coronary artery disease, allergic rhinitis, GERD, chronic kidney disease, osteoarthritis, anxiety, depression  Pharmacist Clinical Goal(s):  Marland Kitchen Maintain blood glucose control with A1c less than 7%. Recommend taking all evening medications at the same with with glipizide before evening meal for easier  medication schedule and to prevent missed doses of glipizide. . Maintain blood pressure within goal of less than 140/90 mmHg. If home blood pressure is elevated, please contact pharmacist. . Reduce medication burden. May discontinue omeprazole permanently if no symptoms.  . Remain up to date on vaccinations. Recommend receiving your tetanus vaccine (Tdap) as it has been more than 10 years since your last tetanus booster. Also recommend 2 dose series of shingles vaccine (Shingrix). Please see your local pharmacy to ask about these vaccines.   Interventions: . Comprehensive medication review performed.  Patient Self Care Activities:  . Self administers medications as prescribed  Initial goal documentation      Diabetes   Recent Relevant Labs: Lab Results  Component Value Date/Time   HGBA1C 6.2 07/03/2019 08:57 AM   HGBA1C 6.4 08/23/2018 09:38 AM   MICROALBUR 2.8 (H) 02/19/2011 10:44 AM   MICROALBUR 4.2 (H) 07/09/2010 08:26 AM    Checking BG: none  Patient has failed these meds in past: none  Patient is currently controlled on the following medications:   Glipizide 5 mg - 1/2 tablet BID before meals  Last diabetic eye exam:  Lab Results  Component Value Date/Time   HMDIABEYEEXA No Retinopathy 09/12/2019 12:00 AM    Last diabetic foot exam:  Lab Results  Component Value Date/Time   HMDIABFOOTEX done 07/11/2019 12:00 AM    We discussed: reports forgetting to take it at times because its her only meal time medication; Wakes up at 6 AM for morning medications, takes Prevalite at 8 AM, glipizide before lunch, takes afternoon pills at 5:30 PM, second dose glipizide at 6:30 (before dinner) and Prevalite again at bedtime; fills pillbox weekly --> recommended switching all evening meds to 6:30 so she can put second dose of glipizide in pillbox  Plan: Continue current medications; Take all evening medications before dinner with glipizide to make schedule easier.   Hypertension   CMP  Latest Ref Rng & Units 07/03/2019 08/23/2018 02/22/2018  Glucose 70 - 99 mg/dL 132(H) 149(H) 133(H)  BUN 6 - 23 mg/dL 28(H) 32(H) 32(H)  Creatinine 0.40 - 1.20 mg/dL 1.88(H) 1.55(H) 1.60(H)  Sodium 135 - 145 mEq/L 140 143 140  Potassium 3.5 - 5.1 mEq/L 4.2 3.5 4.0  Chloride 96 - 112 mEq/L 105 106 106  CO2 19 - 32 mEq/L '27 26 25  '$ Calcium 8.4 - 10.5 mg/dL 9.0 8.8 8.4  Total Protein 6.0 - 8.3 g/dL 6.6 6.7 6.7  Total Bilirubin 0.2 - 1.2 mg/dL 0.8 0.8 0.5  Alkaline Phos 39 - 117 U/L 64 74 68  AST 0 - 37 U/L '19 19 18  '$ ALT 0 - 35 U/L '14 14 13   '$ Office blood pressures are: BP Readings from Last 3 Encounters:  07/12/19 124/70  07/11/19 120/80  05/16/19 122/68   BP goal < 140/90 mmHg Patient has failed these meds in the past: none Patient checks BP at home infrequently  Patient home BP readings are ranging: usually 'good'  Patient is currently controlled on the following medications:   Hydrochlorothiazide 25 mg - 1 tablet daily  Losartan 100 mg - 1 tablet daily  Carvedilol 12.5 mg - 1 tablet BID  Diltiazem 180 mg - 1 capsule daily   Isosorbide Mononitrate 30 mg - 1 tablet daily   We discussed: confirmed adherence   Plan: Continue current medications  Hyperlipidemia/CAD   Lipid Panel     Component Value Date/Time   CHOL 103 07/03/2019 0857   TRIG 130.0 07/03/2019 0857   TRIG 240 (HH) 09/07/2006 0849   HDL 42.70 07/03/2019 0857   CHOLHDL 2 07/03/2019 0857   VLDL 26.0 07/03/2019 0857   LDLCALC 34 07/03/2019 0857   LDLDIRECT 50.0 10/22/2014 0841    LDL goal < 70  Patient has failed these meds in past: none  Patient is currently controlled on the following medications:   Atorvastatin 20 mg - 1 tablet daily (PM)  Plan: Continue current medications  AFIB   CBC Latest Ref Rng & Units 07/03/2019 10/24/2018 02/22/2018  WBC 4.0 - 10.5 K/uL 8.9 8.6 8.0  Hemoglobin 12.0 - 15.0 g/dL 13.0 13.0 11.6(L)  Hematocrit 36.0 - 46.0 % 38.8 38.7 33.9(L)  Platelets 150.0 - 400.0 K/uL  193.0 209 189.0   Symptoms: denies  Followed by Dr. Percival Spanish Patient is currently rate controlled HR: checks occasionally, no concerns reported  Patient has failed these meds in past: none Patient is currently controlled on the following medications:   Carvedilol 12.5 mg - 1 tablet BID  Diltiazem 180 mg - 1 capsule daily   Eliquis 5 mg - 1 tablet BID   We discussed:  Adherence, CBC stable   Plan: Continue current medications  Anxiety/Depression   Patient has failed these meds in past: none  Patient is currently controlled on the following medications:   Citalopram 20 mg - 1 tablet daily  We discussed: denies concerns, doing very well  Plan: Continue current medications   Allergies   Patient has failed these meds in past: none Patient is currently controlled on the following medications:   Cetirizine 10 mg - 1 tablet daily (PM)  We discussed: works well  Plan: Continue current medications   GERD/Loose Stools  Personal history of colon cancer, resected March 2010, with resulting chronic loose stools, on cholestyramine (per Gastro note 02/2018)  Patient has failed these meds in past: none  Patient is currently controlled on the following medications:   Omeprazole 20 mg - 1 capsule daily (not taking)  Cholestyramine light 4 g (Prevalite) - 1 packet BID with meal 2 hours apart from other meds  We discussed: reports cutting back on omeprazole from 2 capsules to 1 capsule daily several years ago and recently stopped taking, denies acid reflux symptoms, would like to know if okay to remain off PPI --> okay to remain off PPI, do not see compelling indication in chart, no history of GI bleed  Plan: Patient will remain off PPI unless symptoms return.   Anemia   CBC Latest Ref Rng & Units 07/03/2019 10/24/2018 02/22/2018  WBC 4.0 - 10.5 K/uL 8.9 8.6 8.0  Hemoglobin 12.0 - 15.0 g/dL 13.0 13.0 11.6(L)  Hematocrit 36.0 - 46.0 % 38.8 38.7 33.9(L)  Platelets 150.0 - 400.0  K/uL 193.0 209 189.0   Iron/TIBC/Ferritin/ %Sat    Component Value Date/Time   IRON 17 (L) 09/27/2008 2055   TIBC 465 09/27/2008 2055   FERRITIN 10 09/27/2008 2055   IRONPCTSAT 4 (L) 09/27/2008 2055   Patient has failed these meds in past: none  Patient is currently controlled on the following medications:  Ferrous sulfate 325 mg EC - 1 tablet twice daily (AM)  We discussed: tolerating well   Plan: Continue current medications  Vitamin D   Vitamin D 02/22/18: 22.4 (low) Patient has failed these meds in past: none Patient is currently on the following medications:   Calcium 600 mg/vitamin D 20 mcg - twice daily   We discussed: last vitamin D level was low, seems pt was on supplementation at that time  Plan: Continue current medications; Recommend repeat vitamin D level.   Vaccines   Reviewed and discussed patient's vaccination history.    Immunization History  Administered Date(s) Administered  . Influenza Split 06/29/2011, 07/08/2011, 06/17/2013  . Influenza Whole 07/01/2007, 06/18/2008, 07/12/2009, 07/17/2010  . Influenza, High Dose Seasonal PF 06/25/2014, 06/10/2015, 06/06/2016, 05/26/2017, 05/28/2018, 05/21/2019  . Pneumococcal Conjugate-13 10/26/2014  . Pneumococcal Polysaccharide-23 09/29/2003, 05/20/2012  . Td 07/12/2009   Plan: Recommended patient receive Tdap and Shingrix vaccine series.   Medication Management  Misc: Clindamycin 300 mg - for dental work 6 months ago (not taking); Nystatin Cream - (not taking)   OTCs: Diclofenac gel 1% - 2 gm QID PRN,   Pharmacy/Benefits: [facility] (mails it to her or picks up depending on schedule)  Adherence: pillbox, forgets glipizide on occasion    Affordability: no concerns  CCM Follow Up: June 18, 2020 8:30 AM (telephone)  [name], PharmD Clinical Pharmacist [facility] at [facility] [phone]

## 2019-12-26 ENCOUNTER — Telehealth: Payer: Self-pay

## 2019-12-26 NOTE — Telephone Encounter (Signed)
PCP Consult for labs:  Dr. Diona Browner,  Tonya [name]'s last vitamin D level on 02/22/18 was 22.4. She is currently taking  Calcium 600 mg/vitamin D 20 mcg twice daily. Would you recommend repeating the vitamin D level or are you comfortable without checking considering she is still supplementing? It seems she was taking the supplement prior to the last lab.   Thank you,  [name], PharmD Clinical Pharmacist [facility] at [facility] [phone]

## 2019-12-26 NOTE — Telephone Encounter (Signed)
We can check the next time she has labs.

## 2019-12-26 NOTE — Patient Instructions (Addendum)
Dear Tonya [name],  It was a pleasure meeting you during our initial appointment on December 20, 2019 . Below is a summary of the goals we discussed and components of chronic care management. Please contact me anytime with questions or concerns.   Visit Information  Goals Addressed            This Visit's Progress   . Pharmacy Care Plan       CARE PLAN ENTRY  Current Barriers:  . Chronic Disease Management support, education, and care coordination needs related to hypertension, hyperlipidemia, type 2 diabetes, atrial fibrillation, coronary artery disease, allergic rhinitis, GERD, chronic kidney disease, osteoarthritis, anxiety, depression  Pharmacist Clinical Goal(s):  Marland Kitchen Maintain blood glucose control with A1c less than 7%. Recommend taking all evening medications at the same with with glipizide before evening meal for easier medication schedule and to prevent missed doses of glipizide. . Maintain blood pressure within goal of less than 140/90 mmHg. If home blood pressure is elevated, please contact pharmacist. . Reduce medication burden. May discontinue omeprazole permanently if no symptoms.  . Remain up to date on vaccinations. Recommend receiving your tetanus vaccine (Tdap) as it has been more than 10 years since your last tetanus booster. Also recommend 2 dose series of shingles vaccine (Shingrix). Please see your local pharmacy to ask about these vaccines.   Interventions: . Comprehensive medication review performed.  Patient Self Care Activities:  . Self administers medications as prescribed  Initial goal documentation       Tonya [name] was given information about Chronic Care Management services today including:  1. CCM service includes personalized support from designated clinical staff supervised by her physician, including individualized plan of care and coordination with other care providers 2. 24/7 contact phone numbers for assistance for urgent and routine care  needs. 3. Standard insurance, coinsurance, copays and deductibles apply for chronic care management only during months in which we provide at least 20 minutes of these services. Most insurances cover these services at 100%, however patients may be responsible for any copay, coinsurance and/or deductible if applicable. This service may help you avoid the need for more expensive face-to-face services. 4. Only one practitioner may furnish and bill the service in a calendar month. 5. The patient may stop CCM services at any time (effective at the end of the month) by phone call to the office staff.  Patient agreed to services and verbal consent obtained.   The patient verbalized understanding of instructions provided today and agreed to receive a mailed copy of patient instruction and/or educational materials. Telephone follow up appointment with pharmacy team member scheduled for: June 18, 2020 8:30 AM   [name], PharmD Clinical Pharmacist [facility] at [facility] [phone]  Recombinant Zoster (Shingles) Vaccine: What You Need to Know 1. Why get vaccinated? Recombinant zoster (shingles) vaccine can prevent shingles. Shingles (also called herpes zoster, or just zoster) is a painful skin rash, usually with blisters. In addition to the rash, shingles can cause fever, headache, chills, or upset stomach. More rarely, shingles can lead to pneumonia, hearing problems, blindness, brain inflammation (encephalitis), or death. The most common complication of shingles is long-term nerve pain called postherpetic neuralgia (PHN). PHN occurs in the areas where the shingles rash was, even after the rash clears up. It can last for months or years after the rash goes away. The pain from PHN can be severe and debilitating. About 10 to 18% of people who get shingles will experience PHN.  The risk of PHN increases with age. An older adult with shingles is more likely to develop PHN and have  longer lasting and more severe pain than a younger person with shingles. Shingles is caused by the varicella zoster virus, the same virus that causes chickenpox. After you have chickenpox, the virus stays in your body and can cause shingles later in life. Shingles cannot be passed from one person to another, but the virus that causes shingles can spread and cause chickenpox in someone who had never had chickenpox or received chickenpox vaccine. 2. Recombinant shingles vaccine Recombinant shingles vaccine provides strong protection against shingles. By preventing shingles, recombinant shingles vaccine also protects against PHN. Recombinant shingles vaccine is the preferred vaccine for the prevention of shingles. However, a different vaccine, live shingles vaccine, may be used in some circumstances. The recombinant shingles vaccine is recommended for adults 50 years and older without serious immune problems. It is given as a two-dose series. This vaccine is also recommended for people who have already gotten another type of shingles vaccine, the live shingles vaccine. There is no live virus in this vaccine. Shingles vaccine may be given at the same time as other vaccines. 3. Talk with your health care provider Tell your vaccine provider if the person getting the vaccine:  Has had an allergic reaction after a previous dose of recombinant shingles vaccine, or has any severe, life-threatening allergies.  Is pregnant or breastfeeding.  Is currently experiencing an episode of shingles. In some cases, your health care provider may decide to postpone shingles vaccination to a future visit. People with minor illnesses, such as a cold, may be vaccinated. People who are moderately or severely ill should usually wait until they recover before getting recombinant shingles vaccine. Your health care provider can give you more information. 4. Risks of a vaccine reaction  A sore arm with mild or moderate pain is  very common after recombinant shingles vaccine, affecting about 80% of vaccinated people. Redness and swelling can also happen at the site of the injection.  Tiredness, muscle pain, headache, shivering, fever, stomach pain, and nausea happen after vaccination in more than half of people who receive recombinant shingles vaccine. In clinical trials, about 1 out of 6 people who got recombinant zoster vaccine experienced side effects that prevented them from doing regular activities. Symptoms usually went away on their own in 2 to 3 days. You should still get the second dose of recombinant zoster vaccine even if you had one of these reactions after the first dose. People sometimes faint after medical procedures, including vaccination. Tell your provider if you feel dizzy or have vision changes or ringing in the ears. As with any medicine, there is a very remote chance of a vaccine causing a severe allergic reaction, other serious injury, or death. 5. What if there is a serious problem? An allergic reaction could occur after the vaccinated person leaves the clinic. If you see signs of a severe allergic reaction (hives, swelling of the face and throat, difficulty breathing, a fast heartbeat, dizziness, or weakness), call 9-1-1 and get the person to the nearest hospital. For other signs that concern you, call your health care provider. Adverse reactions should be reported to the Vaccine Adverse Event Reporting System (VAERS). Your health care provider will usually file this report, or you can do it yourself. Visit the VAERS website at www.vaers.SamedayNews.es or call (223)817-9537. VAERS is only for reporting reactions, and VAERS staff do not give medical advice. 6. How  can I learn more?  Ask your health care provider.  Call your local or state health department.  Contact the Centers for Disease Control and Prevention (CDC): ? Call 972-636-6889 (1-800-CDC-INFO) or ? Visit CDC's website at  http://hunter.com/ Vaccine Information Statement Recombinant Zoster Vaccine (07/27/2018) This information is not intended to replace advice given to you by your health care provider. Make sure you discuss any questions you have with your health care provider. Document Revised: 01/03/2019 Document Reviewed: 04/20/2018 Elsevier Patient Education  [place].

## 2019-12-26 NOTE — Telephone Encounter (Signed)
Great, thank you!

## 2020-01-01 ENCOUNTER — Telehealth: Payer: Self-pay | Admitting: Family Medicine

## 2020-01-01 DIAGNOSIS — E1121 Type 2 diabetes mellitus with diabetic nephropathy: Secondary | ICD-10-CM

## 2020-01-01 DIAGNOSIS — D638 Anemia in other chronic diseases classified elsewhere: Secondary | ICD-10-CM

## 2020-01-01 DIAGNOSIS — E559 Vitamin D deficiency, unspecified: Secondary | ICD-10-CM

## 2020-01-01 LAB — HM DIABETES FOOT EXAM

## 2020-01-01 NOTE — Telephone Encounter (Signed)
-----   Message from Ellamae Sia sent at 12/19/2019  4:01 PM EDT ----- Regarding: Lab orders for Tuesday, 4.6.21 Lab orders for f/u

## 2020-01-02 ENCOUNTER — Other Ambulatory Visit: Payer: Self-pay

## 2020-01-02 ENCOUNTER — Other Ambulatory Visit (INDEPENDENT_AMBULATORY_CARE_PROVIDER_SITE_OTHER): Payer: Medicare Other

## 2020-01-02 DIAGNOSIS — E559 Vitamin D deficiency, unspecified: Secondary | ICD-10-CM

## 2020-01-02 DIAGNOSIS — E1121 Type 2 diabetes mellitus with diabetic nephropathy: Secondary | ICD-10-CM

## 2020-01-02 LAB — LIPID PANEL
Cholesterol: 114 mg/dL (ref 0–200)
HDL: 45 mg/dL (ref >39.00)
LDL Cholesterol: 46 mg/dL (ref 0–99)
NonHDL: 69.31
Total CHOL/HDL Ratio: 3
Triglycerides: 116 mg/dL (ref 0.0–149.0)
VLDL: 23.2 mg/dL (ref 0.0–40.0)

## 2020-01-02 LAB — COMPREHENSIVE METABOLIC PANEL
ALT: 17 U/L (ref 0–35)
AST: 20 U/L (ref 0–37)
Albumin: 4.2 g/dL (ref 3.5–5.2)
Alkaline Phosphatase: 63 U/L (ref 39–117)
BUN: 28 mg/dL — ABNORMAL HIGH (ref 6–23)
CO2: 27 mEq/L (ref 19–32)
Calcium: 9.1 mg/dL (ref 8.4–10.5)
Chloride: 105 mEq/L (ref 96–112)
Creatinine, Ser: 1.7 mg/dL — ABNORMAL HIGH (ref 0.40–1.20)
GFR: 28.93 mL/min — ABNORMAL LOW (ref >60.00)
Glucose, Bld: 124 mg/dL — ABNORMAL HIGH (ref 70–99)
Potassium: 4 mEq/L (ref 3.5–5.1)
Sodium: 140 mEq/L (ref 135–145)
Total Bilirubin: 0.8 mg/dL (ref 0.2–1.2)
Total Protein: 6.7 g/dL (ref 6.0–8.3)

## 2020-01-02 LAB — VITAMIN D 25 HYDROXY (VIT D DEFICIENCY, FRACTURES): VITD: 23.68 ng/mL — ABNORMAL LOW (ref 30.00–100.00)

## 2020-01-02 LAB — HEMOGLOBIN A1C: Hgb A1c MFr Bld: 6.2 % (ref 4.6–6.5)

## 2020-01-02 NOTE — Progress Notes (Signed)
No critical labs need to be addressed urgently. We will discuss labs in detail at upcoming office visit.   

## 2020-01-06 ENCOUNTER — Other Ambulatory Visit: Payer: Self-pay | Admitting: Family Medicine

## 2020-01-09 ENCOUNTER — Ambulatory Visit (INDEPENDENT_AMBULATORY_CARE_PROVIDER_SITE_OTHER): Payer: Medicare Other | Admitting: Family Medicine

## 2020-01-09 ENCOUNTER — Encounter: Payer: Self-pay | Admitting: Family Medicine

## 2020-01-09 ENCOUNTER — Other Ambulatory Visit: Payer: Self-pay

## 2020-01-09 VITALS — BP 102/52 | HR 74 | Temp 97.5°F | Ht 65.5 in | Wt 167.5 lb

## 2020-01-09 DIAGNOSIS — E1121 Type 2 diabetes mellitus with diabetic nephropathy: Secondary | ICD-10-CM

## 2020-01-09 DIAGNOSIS — I482 Chronic atrial fibrillation, unspecified: Secondary | ICD-10-CM

## 2020-01-09 DIAGNOSIS — R42 Dizziness and giddiness: Secondary | ICD-10-CM | POA: Insufficient documentation

## 2020-01-09 DIAGNOSIS — I152 Hypertension secondary to endocrine disorders: Secondary | ICD-10-CM | POA: Insufficient documentation

## 2020-01-09 DIAGNOSIS — E785 Hyperlipidemia, unspecified: Secondary | ICD-10-CM

## 2020-01-09 DIAGNOSIS — E1169 Type 2 diabetes mellitus with other specified complication: Secondary | ICD-10-CM

## 2020-01-09 DIAGNOSIS — E1159 Type 2 diabetes mellitus with other circulatory complications: Secondary | ICD-10-CM | POA: Diagnosis not present

## 2020-01-09 DIAGNOSIS — I10 Essential (primary) hypertension: Secondary | ICD-10-CM

## 2020-01-09 MED ORDER — OMEPRAZOLE 20 MG PO CPDR: 20.0000 mg | DELAYED_RELEASE_CAPSULE | Freq: Every day | ORAL | 3 refills | Status: DC

## 2020-01-09 MED ORDER — VITAMIN D (ERGOCALCIFEROL) 1.25 MG (50000 UNIT) PO CAPS: 50000.0000 [IU] | ORAL_CAPSULE | ORAL | 0 refills | Status: DC

## 2020-01-09 NOTE — Assessment & Plan Note (Signed)
Likely due to hypotension.. cahnge HCTZ to prn.  Also D/C glipizide.

## 2020-01-09 NOTE — Assessment & Plan Note (Addendum)
Well controlled. D/C glipizide because higher risk of causing hypoglycemia.

## 2020-01-09 NOTE — Assessment & Plan Note (Addendum)
Rate controlled.  Reviewed last cardiology OV note from 05/18/19.

## 2020-01-09 NOTE — Progress Notes (Signed)
Chief Complaint  Patient presents with  . Follow-up    History of Present Illness: HPI  79 year old female presents for 6 month follow up DM.  Diabetes:  Good control .. only occ using glipizide. Lab Results  Component Value Date   HGBA1C 6.2 01/02/2020  Using medications without difficulties: Hypoglycemic episodes:none... not clearly associated with her lightheadedness but sometimes 70. Hyperglycemic episodes:none Feet problems:no ulcers Blood Sugars averaging: FBS 82 eye exam within last year: 09/12/2019  Hypertension:   BP low normal on diltiazem, coreg, losartan HCTZ, isosorbide.  No N/V.Marland Kitchen she has stable intermittant diarrhea from colon issues. Good PO intake. No med cahnges. BP Readings from Last 3 Encounters:  01/09/20 (!) 102/52  07/12/19 124/70  07/11/19 120/80  Using medication without problems or lightheadedness:  YES Chest pain with exertion:none Edema: in let leg chronically Short of breath: none Average home BPs: Other issues: Wt Readings from Last 3 Encounters:  01/09/20 167 lb 8 oz (76 kg)  12/13/19 164 lb (74.4 kg)  07/12/19 165 lb (74.8 kg)   Elevated Cholesterol:  LDL at goal on statin. Lab Results  Component Value Date   CHOL 114 01/02/2020   HDL 45.00 01/02/2020   LDLCALC 46 01/02/2020   LDLDIRECT 50.0 10/22/2014   TRIG 116.0 01/02/2020   CHOLHDL 3 01/02/2020  Using medications without problems: Muscle aches:  Diet compliance: good Exercise: walking some Other complaints:  Afib rate controlled, on anticog ELiquis. Followed by Dr. Percival Spanish.  VIt D low: She has not bee using supplement.  This visit occurred during the SARS-CoV-2 public health emergency.  Safety protocols were in place, including screening questions prior to the visit, additional usage of staff PPE, and extensive cleaning of exam room while observing appropriate contact time as indicated for disinfecting solutions.   COVID 19 screen:  No recent travel or known exposure to  COVID19 The patient denies respiratory symptoms of COVID 19 at this time. The importance of social distancing was discussed today.     Review of Systems  Constitutional: Negative for chills and fever.  HENT: Negative for congestion and ear pain.   Eyes: Negative for pain and redness.  Respiratory: Negative for cough and shortness of breath.   Cardiovascular: Negative for chest pain, palpitations and leg swelling.  Gastrointestinal: Negative for abdominal pain, blood in stool, constipation, diarrhea, nausea and vomiting.  Genitourinary: Negative for dysuria.  Musculoskeletal: Negative for falls and myalgias.  Skin: Negative for rash.  Neurological: Positive for dizziness.  Psychiatric/Behavioral: Negative for depression. The patient is not nervous/anxious.       Past Medical History:  Diagnosis Date  . Adenocarcinoma, colon ([place])   . Anemia    iron deficiency  . Anxiety   . Carpal tunnel syndrome   . Cataract    REMOVED BILATERAL  . Chronic diarrhea   . Colon polyps    Tubular adenoma  . Degenerative disk disease   . Depression   . Diabetes mellitus, type 2 ([place])   . Dysrhythmia    h/o AFIB  . GERD (gastroesophageal reflux disease)   . Hyperlipidemia   . Hypertension   . IBS (irritable bowel syndrome)   . Osteoarthritis   . Osteoporosis   . S/P coronary artery stent placement 2007    reports that she has never smoked. She has never used smokeless tobacco. She reports that she does not drink alcohol or use drugs.   Current Outpatient Medications:  .  atorvastatin (LIPITOR) 20 MG tablet, TAKE  1 TABLET BY MOUTH EVERY DAY, Disp: 90 tablet, Rfl: 3 .  Blood Glucose Monitoring Suppl (ACCU-CHEK AVIVA PLUS) w/Device KIT, Use to check blood once sugar daily., Disp: 1 kit, Rfl: 0 .  calcium-vitamin D (OSCAL WITH D 500-200) 500-200 MG-UNIT per tablet, Take 1 tablet by mouth 2 (two) times daily.  , Disp: , Rfl:  .  carvedilol (COREG) 12.5 MG tablet, TAKE 1 TABLET (12.5 MG TOTAL)  BY MOUTH 2 (TWO) TIMES DAILY WITH A MEAL., Disp: 180 tablet, Rfl: 3 .  cetirizine (ZYRTEC) 10 MG tablet, Take 10 mg by mouth daily as needed for allergies., Disp: , Rfl:  .  cholestyramine light (PREVALITE) 4 g packet, USE 1 PACKET TWICE DAILY WITH A MEAL, AT LEAST 2 HOURS AWAY FROM OTHER MEDS, Disp: 180 packet, Rfl: 0 .  citalopram (CELEXA) 20 MG tablet, TAKE 1 TABLET BY MOUTH EVERY DAY, Disp: 90 tablet, Rfl: 1 .  clindamycin (CLEOCIN) 300 MG capsule, Take 2 capsules (655m) one hour prior to procedure, Disp: 10 capsule, Rfl: 0 .  diclofenac sodium (VOLTAREN) 1 % GEL, Apply 2 g topically 4 (four) times daily., Disp: 100 g, Rfl: 3 .  diltiazem (CARDIZEM CD) 180 MG 24 hr capsule, TAKE 1 CAPSULE BY MOUTH EVERY DAY, Disp: 90 capsule, Rfl: 3 .  ELIQUIS 5 MG TABS tablet, TAKE 1 TABLET BY MOUTH TWICE A DAY, Disp: 60 tablet, Rfl: 11 .  ferrous sulfate 325 (65 FE) MG EC tablet, Take 325 mg by mouth 2 (two) times daily., Disp: , Rfl:  .  glipiZIDE (GLUCOTROL) 5 MG tablet, TAKE 0.5 TABLETS (2.5 MG TOTAL) BY MOUTH 2 (TWO) TIMES DAILY BEFORE A MEAL., Disp: 90 tablet, Rfl: 3 .  glucose blood (ACCU-CHEK AVIVA PLUS) test strip, Use to check blood sugar daily., Disp: 100 each, Rfl: 3 .  hydrochlorothiazide (HYDRODIURIL) 25 MG tablet, TAKE 1 TABLET BY MOUTH EVERY DAY **TAKE WITH LOSARTAN**, Disp: 90 tablet, Rfl: 3 .  isosorbide mononitrate (IMDUR) 30 MG 24 hr tablet, Take 1 tablet (30 mg total) by mouth daily., Disp: 90 tablet, Rfl: 2 .  nystatin cream (MYCOSTATIN), Apply 1 application topically 2 (two) times daily as needed for dry skin., Disp: 30 g, Rfl: 1 .  omeprazole (PRILOSEC) 20 MG capsule, TAKE 2 CAPSULES BY MOUTH EVERY DAY, Disp: 180 capsule, Rfl: 0 .  losartan (COZAAR) 100 MG tablet, Take 1 tablet (100 mg total) by mouth daily., Disp: 90 tablet, Rfl: 3   Observations/Objective: Blood pressure (!) 102/52, pulse 74, temperature (!) 97.5 F (36.4 C), temperature source Temporal, height 5' 5.5" (1.664 m),  weight 167 lb 8 oz (76 kg), SpO2 97 %.  Physical Exam Constitutional:      General: She is not in acute distress.    Appearance: Normal appearance. She is well-developed. She is not ill-appearing or toxic-appearing.  HENT:     Head: Normocephalic.     Right Ear: Hearing, tympanic membrane, ear canal and external ear normal. Tympanic membrane is not erythematous, retracted or bulging.     Left Ear: Hearing, tympanic membrane, ear canal and external ear normal. Tympanic membrane is not erythematous, retracted or bulging.     Nose: No mucosal edema or rhinorrhea.     Right Sinus: No maxillary sinus tenderness or frontal sinus tenderness.     Left Sinus: No maxillary sinus tenderness or frontal sinus tenderness.     Mouth/Throat:     Pharynx: Uvula midline.  Eyes:     General: Lids are normal.  Lids are everted, no foreign bodies appreciated.     Conjunctiva/sclera: Conjunctivae normal.     Pupils: Pupils are equal, round, and reactive to light.  Neck:     Thyroid: No thyroid mass or thyromegaly.     Vascular: No carotid bruit.     Trachea: Trachea normal.  Cardiovascular:     Rate and Rhythm: Normal rate and regular rhythm.     Pulses: Normal pulses.     Heart sounds: Normal heart sounds, S1 normal and S2 normal. No murmur. No friction rub. No gallop.   Pulmonary:     Effort: Pulmonary effort is normal. No tachypnea or respiratory distress.     Breath sounds: Normal breath sounds. No decreased breath sounds, wheezing, rhonchi or rales.  Abdominal:     General: Bowel sounds are normal.     Palpations: Abdomen is soft.     Tenderness: There is no abdominal tenderness.  Musculoskeletal:     Cervical back: Normal range of motion and neck supple.  Skin:    General: Skin is warm and dry.     Findings: No rash.  Neurological:     Mental Status: She is alert.  Psychiatric:        Mood and Affect: Mood is not anxious or depressed.        Speech: Speech normal.        Behavior: Behavior  normal. Behavior is cooperative.        Thought Content: Thought content normal.        Judgment: Judgment normal.       Diabetic foot exam: Normal inspection No skin breakdown No calluses  Normal DP pulses Normal sensation to light touch and monofilament Nails normal  Assessment and Plan  Hyperlipidemia associated with type 2 diabetes mellitus ([place]) Well controlled. Continue current medication.   Controlled type 2 diabetes mellitus with diabetic nephropathy ([place]) Well controlled. D/C glipizide because higher risk of causing hypoglycemia.  Hypertension associated with diabetes ([place]) BP running low..change HCTZ to PRN. If no improvement in  Lightheadedness consider decrease of diltiazem.   Atrial fibrillation ([place])  Rate controlled.  Reviewed last cardiology OV note from 05/18/19.  Lightheadedness Likely due to hypotension.. cahnge HCTZ to prn.  Also D/C glipizide.    [name], MD

## 2020-01-09 NOTE — Patient Instructions (Addendum)
Hold HCTZ and use as needed for swelling. Follow BP at home.. call if remaining low or if swelling in ankles worsens significantly.  Stop glipizide. Start vit D weekly x 12 weeks.

## 2020-01-09 NOTE — Assessment & Plan Note (Addendum)
BP running low..change HCTZ to PRN. If no improvement in  Lightheadedness consider decrease of diltiazem.

## 2020-01-09 NOTE — Assessment & Plan Note (Signed)
Well controlled. Continue current medication.  

## 2020-01-14 ENCOUNTER — Other Ambulatory Visit: Payer: Self-pay | Admitting: Family Medicine

## 2020-03-05 ENCOUNTER — Encounter: Payer: Self-pay | Admitting: Family Medicine

## 2020-03-05 ENCOUNTER — Ambulatory Visit (INDEPENDENT_AMBULATORY_CARE_PROVIDER_SITE_OTHER): Payer: Medicare Other | Admitting: Family Medicine

## 2020-03-05 ENCOUNTER — Other Ambulatory Visit: Payer: Self-pay

## 2020-03-05 VITALS — BP 138/84 | HR 93 | Temp 97.9°F | Ht 65.5 in | Wt 169.8 lb

## 2020-03-05 DIAGNOSIS — M7989 Other specified soft tissue disorders: Secondary | ICD-10-CM | POA: Diagnosis not present

## 2020-03-05 DIAGNOSIS — M722 Plantar fascial fibromatosis: Secondary | ICD-10-CM

## 2020-03-05 NOTE — Assessment & Plan Note (Signed)
Given unilateral and pt less active.. rule out DVT with [place].  Elevated leg and increase activity.  May be simply due to varicose veins  L.>R. If negative [place].. start comprsssion hose

## 2020-03-05 NOTE — Assessment & Plan Note (Addendum)
Treat with topical diclofenac and start home PT and ice massage. Info packet given.

## 2020-03-05 NOTE — Patient Instructions (Addendum)
Elevate left leg above heart when resting. Increase walking as able. Start home plantar fascia stretches and ice massage. Can apply topical diclofenac to foot. If heel pain not improving  Follow up with Dr. Durward Fortes. We will call you to set up an ultrasound of left leg to rule out clots.

## 2020-03-05 NOTE — Progress Notes (Signed)
Chief Complaint  Patient presents with  . Joint Swelling    Left Ankle    History of Present Illness: HPI    79 year old female with history of atrial fibrillation, CKD, DM, HTN with new onset swelling in left leg x months. From knee to foot. No pain in calf, no heat, but in last few days slight redness in left foot and left inner ankle.   She has been less active in last several months.   Saw Dr. Durward Fortes left heel in 11/2019... got steroid injection.  BP on recheck in left arm 138/84    This visit occurred during the SARS-CoV-2 public health emergency.  Safety protocols were in place, including screening questions prior to the visit, additional usage of staff PPE, and extensive cleaning of exam room while observing appropriate contact time as indicated for disinfecting solutions.   COVID 19 screen:  No recent travel or known exposure to COVID19 The patient denies respiratory symptoms of COVID 19 at this time. The importance of social distancing was discussed today.     Review of Systems  Constitutional: Negative for chills and fever.  HENT: Negative for congestion and ear pain.   Eyes: Negative for pain and redness.  Respiratory: Negative for cough and shortness of breath.   Cardiovascular: Negative for chest pain, palpitations and leg swelling.  Gastrointestinal: Negative for abdominal pain, blood in stool, constipation, diarrhea, nausea and vomiting.  Genitourinary: Negative for dysuria.  Musculoskeletal: Negative for falls and myalgias.  Skin: Negative for rash.  Neurological: Negative for dizziness.  Psychiatric/Behavioral: Negative for depression. The patient is not nervous/anxious.       Past Medical History:  Diagnosis Date  . Adenocarcinoma, colon ([place])   . Anemia    iron deficiency  . Anxiety   . Carpal tunnel syndrome   . Cataract    REMOVED BILATERAL  . Chronic diarrhea   . Colon polyps    Tubular adenoma  . Degenerative disk disease   .  Depression   . Diabetes mellitus, type 2 ([employer])   . Dysrhythmia    h/o AFIB  . GERD (gastroesophageal reflux disease)   . Hyperlipidemia   . Hypertension   . IBS (irritable bowel syndrome)   . Osteoarthritis   . Osteoporosis   . S/P coronary artery stent placement 2007    reports that she has never smoked. She has never used smokeless tobacco. She reports that she does not drink alcohol or use drugs.   Current Outpatient Medications:  .  atorvastatin (LIPITOR) 20 MG tablet, TAKE 1 TABLET BY MOUTH EVERY DAY, Disp: 90 tablet, Rfl: 3 .  Blood Glucose Monitoring Suppl (ACCU-CHEK AVIVA PLUS) w/Device KIT, Use to check blood once sugar daily., Disp: 1 kit, Rfl: 0 .  carvedilol (COREG) 12.5 MG tablet, TAKE 1 TABLET (12.5 MG TOTAL) BY MOUTH 2 (TWO) TIMES DAILY WITH A MEAL., Disp: 180 tablet, Rfl: 3 .  cetirizine (ZYRTEC) 10 MG tablet, Take 10 mg by mouth daily as needed for allergies., Disp: , Rfl:  .  cholestyramine light (PREVALITE) 4 g packet, USE 1 PACKET TWICE DAILY WITH A MEAL, AT LEAST 2 HOURS AWAY FROM OTHER MEDS, Disp: 180 packet, Rfl: 0 .  citalopram (CELEXA) 20 MG tablet, TAKE 1 TABLET BY MOUTH EVERY DAY, Disp: 90 tablet, Rfl: 1 .  diclofenac sodium (VOLTAREN) 1 % GEL, Apply 2 g topically 4 (four) times daily., Disp: 100 g, Rfl: 3 .  diltiazem (CARDIZEM CD) 180 MG 24 hr  capsule, TAKE 1 CAPSULE BY MOUTH EVERY DAY, Disp: 90 capsule, Rfl: 3 .  ELIQUIS 5 MG TABS tablet, TAKE 1 TABLET BY MOUTH TWICE A DAY, Disp: 60 tablet, Rfl: 11 .  ferrous sulfate 325 (65 FE) MG EC tablet, Take 325 mg by mouth 2 (two) times daily., Disp: , Rfl:  .  glucose blood (ACCU-CHEK AVIVA PLUS) test strip, Use to check blood sugar daily., Disp: 100 each, Rfl: 3 .  isosorbide mononitrate (IMDUR) 30 MG 24 hr tablet, Take 1 tablet (30 mg total) by mouth daily., Disp: 90 tablet, Rfl: 2 .  omeprazole (PRILOSEC) 20 MG capsule, Take 1 capsule (20 mg total) by mouth daily., Disp: 90 capsule, Rfl: 3 .  Vitamin D,  Ergocalciferol, (DRISDOL) 1.25 MG (50000 UNIT) CAPS capsule, Take 1 capsule (50,000 Units total) by mouth every 7 (seven) days., Disp: 12 capsule, Rfl: 0 .  losartan (COZAAR) 100 MG tablet, Take 1 tablet (100 mg total) by mouth daily., Disp: 90 tablet, Rfl: 3   Observations/Objective: Blood pressure (!) 160/90, pulse (!) 108, temperature 97.9 F (36.6 C), temperature source Temporal, height 5' 5.5" (1.664 m), weight 169 lb 12 oz (77 kg), SpO2 96 %.  Physical Exam Constitutional:      General: She is not in acute distress.    Appearance: Normal appearance. She is well-developed. She is not ill-appearing or toxic-appearing.  HENT:     Head: Normocephalic.     Right Ear: Hearing, tympanic membrane, ear canal and external ear normal. Tympanic membrane is not erythematous, retracted or bulging.     Left Ear: Hearing, tympanic membrane, ear canal and external ear normal. Tympanic membrane is not erythematous, retracted or bulging.     Nose: No mucosal edema or rhinorrhea.     Right Sinus: No maxillary sinus tenderness or frontal sinus tenderness.     Left Sinus: No maxillary sinus tenderness or frontal sinus tenderness.     Mouth/Throat:     Pharynx: Uvula midline.  Eyes:     General: Lids are normal. Lids are everted, no foreign bodies appreciated.     Conjunctiva/sclera: Conjunctivae normal.     Pupils: Pupils are equal, round, and reactive to light.  Neck:     Thyroid: No thyroid mass or thyromegaly.     Vascular: No carotid bruit.     Trachea: Trachea normal.  Cardiovascular:     Rate and Rhythm: Normal rate and regular rhythm.     Pulses: Normal pulses.     Heart sounds: Normal heart sounds, S1 normal and S2 normal. No murmur. No friction rub. No gallop.      Comments: Varicose veins bilaterally  chronic venous stasis changes in left lower leg with hemosiderin deposition Pulmonary:     Effort: Pulmonary effort is normal. No tachypnea or respiratory distress.     Breath sounds:  Normal breath sounds. No decreased breath sounds, wheezing, rhonchi or rales.  Abdominal:     General: Bowel sounds are normal.     Palpations: Abdomen is soft.     Tenderness: There is no abdominal tenderness.  Musculoskeletal:     Cervical back: Normal range of motion and neck supple.     Right lower leg: No edema.     Left lower leg: 2+ Edema present.  Skin:    General: Skin is warm and dry.     Findings: No rash.  Neurological:     Mental Status: She is alert.  Psychiatric:  Mood and Affect: Mood is not anxious or depressed.        Speech: Speech normal.        Behavior: Behavior normal. Behavior is cooperative.        Thought Content: Thought content normal.        Judgment: Judgment normal.      Assessment and Plan Left leg swelling Given unilateral and pt less active.. rule out DVT with [place].  Elevated leg and increase activity.  May be simply due to varicose veins  L.>R. If negative [place].. start comprsssion hose  Plantar fasciitis, left Treat with topical diclofenac and start home PT and ice massage. Info packet given.       [name], MD

## 2020-03-08 ENCOUNTER — Ambulatory Visit (HOSPITAL_COMMUNITY)
Admission: RE | Admit: 2020-03-08 | Discharge: 2020-03-08 | Disposition: A | Discharge status: Home / Self Care | Payer: Medicare Other | Source: Ambulatory Visit | Attending: Cardiology | Admitting: Cardiology

## 2020-03-08 ENCOUNTER — Other Ambulatory Visit: Payer: Self-pay

## 2020-03-08 DIAGNOSIS — M7989 Other specified soft tissue disorders: Secondary | ICD-10-CM | POA: Diagnosis not present

## 2020-03-25 ENCOUNTER — Other Ambulatory Visit: Payer: Self-pay | Admitting: Family Medicine

## 2020-03-25 NOTE — Telephone Encounter (Signed)
Last office visit 03/05/2020 for left leg swelling.  Last refilled 01/09/2020 for #12 with no refills.  Last Vit D level 01/02/2020 which was low at 23.68 ng/ml.

## 2020-04-15 ENCOUNTER — Telehealth: Payer: Self-pay | Admitting: Family Medicine

## 2020-04-15 NOTE — Progress Notes (Signed)
  Chronic Care Management   Outreach Note  04/15/2020 Name: Tonya [name] MRN: [id number] DOB: 1941/04/27  Referred by: [name], MD Reason for referral : No chief complaint on file.   An unsuccessful telephone outreach was attempted today. The patient was referred to the pharmacist for assistance with care management and care coordination.   Follow Up Plan:   Earney [place] [employer] Scheduler

## 2020-04-16 ENCOUNTER — Other Ambulatory Visit: Payer: Self-pay

## 2020-04-16 MED ORDER — CHOLESTYRAMINE LIGHT 4 G PO PACK: PACK | ORAL | 0 refills | Status: DC

## 2020-05-02 ENCOUNTER — Other Ambulatory Visit: Payer: Self-pay | Admitting: Cardiology

## 2020-05-13 ENCOUNTER — Other Ambulatory Visit: Payer: Self-pay | Admitting: Cardiology

## 2020-05-15 NOTE — Progress Notes (Signed)
Cardiology Office Note   Date:  05/16/2020   ID:  Tonya [name], DOB [birth year], MRN [id number]  PCP:  [name], MD  Cardiologist:   No primary care provider on file. Referring:  [name], MD  Chief Complaint  Patient presents with  . Atrial Fibrillation      History of Present Illness: Tonya [name] is a 79 y.o. female who presents for evaluation of atrial fibrillation.  Since I last saw her she has done well.  The patient denies any new symptoms such as chest discomfort, neck or arm discomfort. There has been no new shortness of breath, PND or orthopnea. There have been no reported palpitations, presyncope or syncope.  She does her daily chores.  She lives with her daughter, son-in-law and their two children and has to help with them.  .   Past Medical History:  Diagnosis Date  . Adenocarcinoma, colon ([place])   . Anemia    iron deficiency  . Anxiety   . Carpal tunnel syndrome   . Cataract    REMOVED BILATERAL  . Chronic diarrhea   . Colon polyps    Tubular adenoma  . Degenerative disk disease   . Depression   . Diabetes mellitus, type 2 ([place])   . Dysrhythmia    h/o AFIB  . GERD (gastroesophageal reflux disease)   . Hyperlipidemia   . Hypertension   . IBS (irritable bowel syndrome)   . Osteoarthritis   . Osteoporosis   . S/P coronary artery stent placement 2007    Past Surgical History:  Procedure Laterality Date  . CARDIAC CATHETERIZATION  12/10   LAD stent patent. Insignificant CAD, otherwise EF 60-65%  . CARPAL TUNNEL RELEASE    . CATARACT EXTRACTION  03/2015  . CHOLECYSTECTOMY    . COLON SURGERY    . COLONOSCOPY    . DILATION AND CURETTAGE OF UTERUS    . HEMATOMA EVACUATION     after knee replacement  . KNEE ARTHROSCOPY     bilateral  . LUMBAR LAMINECTOMY N/A 11/26/2015   Procedure: LUMBAR DECOMPRESSIVE LAMINECTOMY L3-4 AND L4-5, LEFT L2-3 HEMILAMINECTOMY;  Surgeon: Jessy Oto, MD;  Location: [facility];  Service: Orthopedics;   Laterality: N/A;  . PARTIAL COLECTOMY  11/2008   right, for adenocarcinoma  . REPLACEMENT TOTAL KNEE  0/3704   right, complicated by hemarthrosis   . REVERSE SHOULDER ARTHROPLASTY Right 05/28/2016  . REVERSE SHOULDER ARTHROPLASTY Right 05/28/2016   Procedure: REVERSE SHOULDER ARTHROPLASTY;  Surgeon: Meredith Pel, MD;  Location: [facility];  Service: Orthopedics;  Laterality: Right;  . TONSILLECTOMY    . TOTAL KNEE ARTHROPLASTY Left 04/03/2014   dr whitfield  . TOTAL KNEE ARTHROPLASTY Left 04/03/2014   Procedure: TOTAL KNEE ARTHROPLASTY;  Surgeon: Garald Balding, MD;  Location: [facility];  Service: Orthopedics;  Laterality: Left;  . TOTAL KNEE REVISION  03/08/2012   Procedure: TOTAL KNEE REVISION;  Surgeon: Garald Balding, MD;  Location: [facility];  Service: Orthopedics;  Laterality: Right;  removal total knee hardware and placement of antibiotic cement spacer and antibiotic beads  . TOTAL KNEE REVISION  05/17/2012   Procedure: TOTAL KNEE REVISION;  Surgeon: Garald Balding, MD;  Location: [facility];  Service: Orthopedics;  Laterality: Right;  right total knee revision, removal of antibiotic spacer     Current Outpatient Medications  Medication Sig Dispense Refill  . atorvastatin (LIPITOR) 20 MG tablet TAKE 1 TABLET BY MOUTH EVERY DAY 90  tablet 1  . Blood Glucose Monitoring Suppl (ACCU-CHEK AVIVA PLUS) w/Device KIT Use to check blood once sugar daily. 1 kit 0  . carvedilol (COREG) 12.5 MG tablet TAKE 1 TABLET (12.5 MG TOTAL) BY MOUTH 2 (TWO) TIMES DAILY WITH A MEAL. 180 tablet 3  . cetirizine (ZYRTEC) 10 MG tablet Take 10 mg by mouth daily as needed for allergies.    . cholestyramine light (PREVALITE) 4 g packet USE 1 PACKET TWICE DAILY WITH A MEAL, AT LEAST 2 HOURS AWAY FROM OTHER MEDS 180 packet 0  . citalopram (CELEXA) 20 MG tablet TAKE 1 TABLET BY MOUTH EVERY DAY 90 tablet 1  . diclofenac sodium (VOLTAREN) 1 % GEL Apply 2 g topically 4 (four) times daily. 100 g 3  . diltiazem (CARDIZEM CD) 180  MG 24 hr capsule TAKE 1 CAPSULE BY MOUTH EVERY DAY 90 capsule 3  . ferrous sulfate 325 (65 FE) MG EC tablet Take 325 mg by mouth 2 (two) times daily.    Marland Kitchen glipiZIDE (GLUCOTROL) 5 MG tablet Take 2.5 mg by mouth 2 (two) times daily.    Marland Kitchen glucose blood (ACCU-CHEK AVIVA PLUS) test strip Use to check blood sugar daily. 100 each 3  . isosorbide mononitrate (IMDUR) 30 MG 24 hr tablet Take 1 tablet (30 mg total) by mouth daily. 90 tablet 2  . losartan (COZAAR) 100 MG tablet TAKE 1 TABLET BY MOUTH EVERY DAY 90 tablet 3  . omeprazole (PRILOSEC) 20 MG capsule Take 1 capsule (20 mg total) by mouth daily. 90 capsule 3  . Vitamin D, Ergocalciferol, (DRISDOL) 1.25 MG (50000 UNIT) CAPS capsule TAKE 1 CAPSULE (50,000 UNITS TOTAL) BY MOUTH EVERY 7 (SEVEN) DAYS. 12 capsule 0  . apixaban (ELIQUIS) 2.5 MG TABS tablet Take 1 tablet (2.5 mg total) by mouth 2 (two) times daily. 180 tablet 3   No current facility-administered medications for this visit.    Allergies:   Amoxicillin-pot clavulanate    ROS:  Please see the history of present illness.   Otherwise, review of systems are positive for none.   All other systems are reviewed and negative.    PHYSICAL EXAM: VS:  BP 124/78   Pulse (!) 103   Temp (!) 96.5 F (35.8 C)   Ht 5' 5.5" (1.664 m)   Wt 166 lb (75.3 kg)   SpO2 94%   BMI 27.20 kg/m  , BMI Body mass index is 27.2 kg/m. GENERAL:  Well appearing NECK:  No jugular venous distention, waveform within normal limits, carotid upstroke brisk and symmetric, no bruits, no thyromegaly LUNGS:  Clear to auscultation bilaterally CHEST:  Unremarkable HEART:  PMI not displaced or sustained,S1 and S2 within normal limits, no S3, no clicks, no rubs, no murmurs, irregular ABD:  Flat, positive bowel sounds normal in frequency in pitch, no bruits, no rebound, no guarding, no midline pulsatile mass, no hepatomegaly, no splenomegaly EXT:  2 plus pulses throughout, no edema, no cyanosis no clubbing  EKG:  EKG is   ordered today. Atrial fibrillation, rate 138, axis within normal limits, intervals within normal limits, no acute ST-T wave changes.  Recent Labs: 07/03/2019: Hemoglobin 13.0; Platelets 193.0 01/02/2020: ALT 17; BUN 28; Creatinine, Ser 1.70; Potassium 4.0; Sodium 140    Lipid Panel    Component Value Date/Time   CHOL 114 01/02/2020 0759   TRIG 116.0 01/02/2020 0759   TRIG 240 (HH) 09/07/2006 0849   HDL 45.00 01/02/2020 0759   CHOLHDL 3 01/02/2020 0759   VLDL 23.2  01/02/2020 0759   LDLCALC 46 01/02/2020 0759   LDLDIRECT 50.0 10/22/2014 0841      Wt Readings from Last 3 Encounters:  05/16/20 166 lb (75.3 kg)  03/05/20 169 lb 12 oz (77 kg)  01/09/20 167 lb 8 oz (76 kg)      Other studies Reviewed: Additional studies/ records that were reviewed today include: Labs Review of the above records demonstrates:  Please see elsewhere in the note.     ASSESSMENT AND PLAN:  ATRIAL FIB:  Tonya [name] has a CHA2DS2 - VASc score of 5 .  She tolerates anticoagulation and rate control.  Now that she is 79 her Eliquis dose is to be reduced because her creatinine is above 1.5.  I will reduce this to 2.5 mg twice daily.  I have asked her to get a CBC when she is seen next by her primary care which will be probably next month.   CAD:   The patient has no new sypmtoms.  No further cardiovascular testing is indicated.  We will continue with aggressive risk reduction and meds as listed.  HTN: The blood pressure is at target.  No change in therapy.   CKD: Creatinine is 1.85.  This is up slightly from previous.  This is followed by [name], MD   COVID EDUCATION:   She has had her vaccine.   Current medicines are reviewed at length with the patient today.  The patient does not have concerns regarding medicines.  The following changes have been made:  As above  Labs/ tests ordered today include:  None  Orders Placed This Encounter  Procedures  . EKG 12-Lead     Disposition:    FU with me in one year.     Signed, Minus Breeding, MD  05/16/2020 9:24 AM    [facility]

## 2020-05-16 ENCOUNTER — Encounter: Payer: Self-pay | Admitting: Cardiology

## 2020-05-16 ENCOUNTER — Other Ambulatory Visit: Payer: Self-pay

## 2020-05-16 ENCOUNTER — Ambulatory Visit: Payer: Medicare Other | Admitting: Cardiology

## 2020-05-16 VITALS — BP 124/78 | HR 103 | Temp 96.5°F | Ht 65.5 in | Wt 166.0 lb

## 2020-05-16 DIAGNOSIS — I10 Essential (primary) hypertension: Secondary | ICD-10-CM

## 2020-05-16 DIAGNOSIS — N1831 Chronic kidney disease, stage 3a: Secondary | ICD-10-CM | POA: Diagnosis not present

## 2020-05-16 DIAGNOSIS — I2510 Atherosclerotic heart disease of native coronary artery without angina pectoris: Secondary | ICD-10-CM

## 2020-05-16 DIAGNOSIS — Z7189 Other specified counseling: Secondary | ICD-10-CM | POA: Diagnosis not present

## 2020-05-16 DIAGNOSIS — I4891 Unspecified atrial fibrillation: Secondary | ICD-10-CM

## 2020-05-16 MED ORDER — APIXABAN 2.5 MG PO TABS: 2.5000 mg | ORAL_TABLET | Freq: Two times a day (BID) | ORAL | 3 refills | Status: DC

## 2020-05-16 NOTE — Patient Instructions (Signed)
Medication Instructions:  Decrease Eliquis to 2.5mg , take 1 tablet twice a day *If you need a refill on your cardiac medications before your next appointment, please call your pharmacy*  Lab Work: None ordered this visit  Testing/Procedures: None ordered this visit  Follow-Up: At [facility], you and your health needs are our priority.  As part of our continuing mission to provide you with exceptional heart care, we have created designated Provider Care Teams.  These Care Teams include your primary Cardiologist (physician) and Advanced Practice Providers (APPs -  Physician Assistants and Nurse Practitioners) who all work together to provide you with the care you need, when you need it.  Your next appointment:   12 month(s)  The format for your next appointment:   In Person  Provider:   Minus Breeding, MD

## 2020-05-20 ENCOUNTER — Telehealth: Payer: Self-pay | Admitting: Cardiology

## 2020-05-20 DIAGNOSIS — I4891 Unspecified atrial fibrillation: Secondary | ICD-10-CM

## 2020-05-20 MED ORDER — APIXABAN 5 MG PO TABS: ORAL_TABLET | ORAL | 6 refills | Status: DC

## 2020-05-20 MED ORDER — APIXABAN 2.5 MG PO TABS: 2.5000 mg | ORAL_TABLET | Freq: Two times a day (BID) | ORAL | 11 refills | Status: DC

## 2020-05-20 NOTE — Telephone Encounter (Signed)
New script sent in for Eliquis 5 mg 1/2 tab twice daily will call back if still to expensive ./cy

## 2020-05-20 NOTE — Telephone Encounter (Signed)
Patient is calling about her Eliquis 2.5mg ., she states it's going to cost $400. She can't afford that. She wants to know if there is something else she can take or some type of help for her.

## 2020-06-13 NOTE — Chronic Care Management (AMB) (Signed)
Chronic Care Management Pharmacy  Name: Tonya [name]  MRN: [id number] DOB: [birth year]  Chief Complaint/ HPI  Tonya [name],  79 y.o., female presents for their Initial CCM visit with the clinical pharmacist via telephone.  PCP : [name], MD  Their chronic conditions include: hypertension, hyperlipidemia, type 2 diabetes, atrial fibrillation, coronary artery disease, allergic rhinitis, GERD, chronic kidney disease, osteoarthritis, anxiety, depression   Office Visits:  03/05/20: left leg swelling - ordered ultrasound to rule out DVT (negative). Recommend compression hose. Plantar fasciitis - treat with ice, topical diclofenac and home PT.   01/09/20: d/c glipizide due to hypoglycemia risk. Change hydrochlorothiazide to PRN. If dizziness doesn't improve, consider dose decrease of diltiazem.   07/11/19: trial Voltaren gel for OA in wrist, continue current meds, shingles vaccine recommended  Consult Visit:  05/16/20: Cardiology - reduced dose of Eliquis to 2.5 mg due to creatinine above 1.5 and age 22. Requested CBC with next primary care visit.   12/13/19: Pain in left foot, plantar fasciitis - cortisone injection, ice, stretching   09/12/19: Eye exam  07/12/19: trigger finger - steroid injection  Allergies  Allergen Reactions  . Amoxicillin-Pot Clavulanate Swelling and Other (See Comments)    Augmentin per patient, tongue swelling   Medications: Outpatient Encounter Medications as of 06/17/2020  Medication Sig  . apixaban (ELIQUIS) 5 MG TABS tablet Take 1/2 tab twice daily  . atorvastatin (LIPITOR) 20 MG tablet TAKE 1 TABLET BY MOUTH EVERY DAY  . Blood Glucose Monitoring Suppl (ACCU-CHEK AVIVA PLUS) w/Device KIT Use to check blood once sugar daily.  . carvedilol (COREG) 12.5 MG tablet TAKE 1 TABLET (12.5 MG TOTAL) BY MOUTH 2 (TWO) TIMES DAILY WITH A MEAL.  . cetirizine (ZYRTEC) 10 MG tablet Take 10 mg by mouth daily as needed for allergies.  . cholestyramine  light (PREVALITE) 4 g packet USE 1 PACKET TWICE DAILY WITH A MEAL, AT LEAST 2 HOURS AWAY FROM OTHER MEDS  . citalopram (CELEXA) 20 MG tablet TAKE 1 TABLET BY MOUTH EVERY DAY  . diclofenac sodium (VOLTAREN) 1 % GEL Apply 2 g topically 4 (four) times daily.  Marland Kitchen diltiazem (CARDIZEM CD) 180 MG 24 hr capsule TAKE 1 CAPSULE BY MOUTH EVERY DAY  . ferrous sulfate 325 (65 FE) MG EC tablet Take 325 mg by mouth 2 (two) times daily.  Marland Kitchen glipiZIDE (GLUCOTROL) 5 MG tablet Take 2.5 mg by mouth 2 (two) times daily.  Marland Kitchen glucose blood (ACCU-CHEK AVIVA PLUS) test strip Use to check blood sugar daily.  . isosorbide mononitrate (IMDUR) 30 MG 24 hr tablet Take 1 tablet (30 mg total) by mouth daily.  Marland Kitchen losartan (COZAAR) 100 MG tablet TAKE 1 TABLET BY MOUTH EVERY DAY  . omeprazole (PRILOSEC) 20 MG capsule Take 1 capsule (20 mg total) by mouth daily.  . Vitamin D, Ergocalciferol, (DRISDOL) 1.25 MG (50000 UNIT) CAPS capsule TAKE 1 CAPSULE (50,000 UNITS TOTAL) BY MOUTH EVERY 7 (SEVEN) DAYS.   No facility-administered encounter medications on file as of 06/17/2020.   Current Diagnosis/Assessment: Goals    .  medication management (pt-stated)      Starting 02/10/2017, I will continue to take medications as prescribed.     .  Patient Stated      07/03/2019, Patient wants to increase her social activity with friends and family.     Marland Kitchen  Pharmacy Care Plan      CARE PLAN ENTRY  Current Barriers:  . Chronic Disease Management support, education, and care  coordination needs related to hypertension, hyperlipidemia, type 2 diabetes, atrial fibrillation, coronary artery disease, allergic rhinitis, GERD, chronic kidney disease, osteoarthritis, anxiety, depression  Pharmacist Clinical Goal(s):  Marland Kitchen Maintain blood glucose control with A1c less than 7%. Dr. Diona Browner discontinued glipizide. Patient is monitoring blood sugar and current readings 70-80 mg/dL. Patient watches diet closely and avoids snacking.  . Maintain blood pressure  within goal of less than 140/90 mmHg. If home blood pressure is elevated, please contact pharmacist. . Reduce medication burden. May discontinue omeprazole permanently if no symptoms.  . Remain up to date on vaccinations. Recommend receiving your tetanus vaccine (Tdap) as it has been more than 10 years since your last tetanus booster. Also recommend 2 dose series of shingles vaccine (Shingrix). Please see your local pharmacy to ask about these vaccines.   Interventions: . Comprehensive medication review performed.  Patient Self Care Activities:  . Self administers medications as prescribed  Please see past updates related to this goal by clicking on the "Past Updates" button in the selected goal       Diabetes   Recent Relevant Labs: Lab Results  Component Value Date/Time   HGBA1C 6.2 01/02/2020 07:59 AM   HGBA1C 6.2 07/03/2019 08:57 AM   MICROALBUR 2.8 (H) 02/19/2011 10:44 AM   MICROALBUR 4.2 (H) 07/09/2010 08:26 AM    Checking BG: none  Patient has failed these meds in past: glipizide (D/C due to risk of hypoglycemia) Patient is currently controlled on the following medications:   Diet/lifestyle  Last diabetic eye exam:  Lab Results  Component Value Date/Time   HMDIABEYEEXA No Retinopathy 09/12/2019 12:00 AM    Last diabetic foot exam:  Lab Results  Component Value Date/Time   HMDIABFOOTEX done 01/01/2020 12:00 AM    We discussed: reports forgetting to take it at times because its her only meal time medication; Wakes up at 6 AM for morning medications, takes Prevalite at 8 AM, glipizide before lunch, takes afternoon pills at 5:30 PM, second dose glipizide at 6:30 (before dinner) and Prevalite again at bedtime; fills pillbox weekly --> recommended switching all evening meds to 6:30 so she can put second dose of glipizide in pillbox  Update 06/17/2020 - Blood sugar has been in the 70-80s. Patient eats 3 meals each days with minimal snacking.  Patient is stopping glipizide  per Dr. Rometta Emery recommendation. Patient's blood sugar remains well controlled. Patient will contact provider or pharmacist if she has any concerns with her blood sugar. Patient denies regular exercise but picks up her grandchildren from school and cares for them.   Plan: Continue current medications and control with diet and exercise;   Hypertension   CMP Latest Ref Rng & Units 01/02/2020 07/03/2019 08/23/2018  Glucose 70 - 99 mg/dL 124(H) 132(H) 149(H)  BUN 6 - 23 mg/dL 28(H) 28(H) 32(H)  Creatinine 0.40 - 1.20 mg/dL 1.70(H) 1.88(H) 1.55(H)  Sodium 135 - 145 mEq/L 140 140 143  Potassium 3.5 - 5.1 mEq/L 4.0 4.2 3.5  Chloride 96 - 112 mEq/L 105 105 106  CO2 19 - 32 mEq/L _0 Calcium 8.4 - 10.5 mg/dL 9.1 9.0 8.8  Total Protein 6.0 - 8.3 g/dL 6.7 6.6 6.7  Total Bilirubin 0.2 - 1.2 mg/dL 0.8 0.8 0.8  Alkaline Phos 39 - 117 U/L 63 64 74  AST 0 - 37 U/L _1 ALT 0 - 35 U/L _2 Office blood pressures are: BP Readings from Last 3 Encounters:  05/16/20 124/78  03/05/20 138/84  01/09/20 (!) 102/52   BP goal < 140/90 mmHg Patient has failed these meds in the past: none Patient checks BP at home infrequently  Patient home BP readings are ranging: usually 'good'  Patient is currently controlled on the following medications:   Hydrochlorothiazide 25 mg - 1 tablet daily prn  Losartan 100 mg - 1 tablet daily  Carvedilol 12.5 mg - 1 tablet BID  Diltiazem 180 mg - 1 capsule daily   Isosorbide Mononitrate 30 mg - 1 tablet daily   We discussed: confirmed adherence   Update 06/17/20 - Hasn't checked blood pressure lately. Blood pressure was good at the doctor last visit. Denies dizziness. Has some swelling in her left leg. Elevating her legs has improved swelling. Patient reports medications are affordable and accessible.   Plan: Continue current medications  Hyperlipidemia/CAD   Lipid Panel     Component Value Date/Time   CHOL 114 01/02/2020 0759   TRIG 116.0  01/02/2020 0759   TRIG 240 (HH) 09/07/2006 0849   HDL 45.00 01/02/2020 0759   CHOLHDL 3 01/02/2020 0759   VLDL 23.2 01/02/2020 0759   LDLCALC 46 01/02/2020 0759   LDLDIRECT 50.0 10/22/2014 0841    LDL goal < 70  Patient has failed these meds in past: none  Patient is currently controlled on the following medications:   Atorvastatin 20 mg - 1 tablet daily (PM)  Plan: Continue current medications  AFIB   CBC Latest Ref Rng & Units 07/03/2019 10/24/2018 02/22/2018  WBC 4.0 - 10.5 K/uL 8.9 8.6 8.0  Hemoglobin 12.0 - 15.0 g/dL 13.0 13.0 11.6(L)  Hematocrit 36 - 46 % 38.8 38.7 33.9(L)  Platelets 150 - 400 K/uL 193.0 209 189.0   Symptoms: denies  Followed by Dr. Percival Spanish Patient is currently rate controlled HR: checks occasionally, no concerns reported  Patient has failed these meds in past: none Patient is currently controlled on the following medications:   Carvedilol 12.5 mg - 1 tablet BID  Diltiazem 180 mg - 1 capsule daily   Eliquis 2.5 mg - 1 tablet BID   We discussed:  Adherence, CBC stable   Update 06/17/20: Patient reports that she has 3-4 month supply of Eliquis at this time. The pharmacy told her Eliquis cost has increased to $133 from $47 likely due to coverage gap. We discussed patient assistance today but patient does not want to proceed at this time. She has 3-4 month supply at home and shouldn't need it the rest of this year. Patient met the income requirements for patient assistance but was denied in the past due to out of pocket requirements. Pharmacist encouraged patient to reach out if anything changed with needing to refill.   Plan: Continue current medications   Vitamin D    VITD  Date Value Ref Range Status  01/02/2020 23.68 (L) 30.00 - 100.00 ng/mL Final   Vitamin D 02/22/18: 22.4 (low) Patient has failed these meds in past: none Patient is currently on the following medications:   Calcium 600 mg/vitamin D 20 mcg - twice daily   Vitamin D 50,000  units weekly  We discussed: last vitamin D level was low, seems pt was on supplementation at that time  Update 06/17/20 - Patient is taking Vitamin D 50,000 weekly. She has an appointment for updated labs and visit with Dr. Diona Browner in October. Patient is tolerating high dose well. We discussed benefits of supplementing with Vitamin D.   Plan: Continue current medications.  Vaccines  Reviewed and discussed patient's vaccination history.    Immunization History  Administered Date(s) Administered  . Influenza Split 06/29/2011, 07/08/2011, 06/17/2013  . Influenza Whole 07/01/2007, 06/18/2008, 07/12/2009, 07/17/2010  . Influenza, High Dose Seasonal PF 06/25/2014, 06/10/2015, 06/06/2016, 05/26/2017, 05/28/2018, 05/21/2019, 05/16/2020  . Moderna SARS-COVID-2 Vaccination 01/25/2020, 02/22/2020  . Pneumococcal Conjugate-13 10/26/2014  . Pneumococcal Polysaccharide-23 09/29/2003, 05/20/2012  . Td 07/12/2009   Plan: Recommended patient receive Tdap and Shingrix vaccine series.   Medication Management  Misc: Clindamycin 300 mg - for dental work 6 months ago (not taking); Nystatin Cream - (not taking)   OTCs: Diclofenac gel 1% - 2 gm QID PRN,   Pharmacy/Benefits: [employer] (mails it to her or picks up depending on schedule)  Adherence: pillbox   Affordability: no concerns  CCM Follow Up: 09/03/20 @ 8:30 am to review updated lab results and discuss Eliquis supply  Sherre Poot, PharmD, [place] Clinical Pharmacist [place] [phone] (office) [phone] (mobile)

## 2020-06-17 ENCOUNTER — Other Ambulatory Visit: Payer: Self-pay

## 2020-06-17 ENCOUNTER — Ambulatory Visit: Payer: Medicare Other

## 2020-06-17 ENCOUNTER — Other Ambulatory Visit: Payer: Self-pay | Admitting: Family Medicine

## 2020-06-17 DIAGNOSIS — E1159 Type 2 diabetes mellitus with other circulatory complications: Secondary | ICD-10-CM

## 2020-06-17 DIAGNOSIS — E1121 Type 2 diabetes mellitus with diabetic nephropathy: Secondary | ICD-10-CM

## 2020-06-17 NOTE — Telephone Encounter (Signed)
Last office visit 03/05/2020 for leg swelling.  Last refilled 03/26/2020 for #12 with no refills.  Last Vit D level 23.68 ng/ml.  CPE scheduled for 07/11/2020.

## 2020-06-17 NOTE — Patient Instructions (Addendum)
Visit Information  Goals Addressed            This Visit's Progress   . Pharmacy Care Plan       CARE PLAN ENTRY  Current Barriers:  . Chronic Disease Management support, education, and care coordination needs related to hypertension, hyperlipidemia, type 2 diabetes, atrial fibrillation, coronary artery disease, allergic rhinitis, GERD, chronic kidney disease, osteoarthritis, anxiety, depression  Pharmacist Clinical Goal(s):  Marland Kitchen Maintain blood glucose control with A1c less than 7%. Dr. Diona Browner discontinued glipizide. Patient is monitoring blood sugar and current readings 70-80 mg/dL. Patient watches diet closely and avoids snacking.  . Maintain blood pressure within goal of less than 140/90 mmHg. If home blood pressure is elevated, please contact pharmacist. . Reduce medication burden. May discontinue omeprazole permanently if no symptoms.  . Remain up to date on vaccinations. Recommend receiving your tetanus vaccine (Tdap) as it has been more than 10 years since your last tetanus booster. Also recommend 2 dose series of shingles vaccine (Shingrix). Please see your local pharmacy to ask about these vaccines.   Interventions: . Comprehensive medication review performed.  Patient Self Care Activities:  . Self administers medications as prescribed  Please see past updates related to this goal by clicking on the "Past Updates" button in the selected goal        The patient verbalized understanding of instructions provided today and declined a print copy of patient instruction materials.   Telephone follow up appointment with pharmacy team member scheduled for: 09/03/2020 @ 8:30 am   Sherre Poot, PharmD, [facility] Clinical Pharmacist [facility] [phone] (office) [phone] (mobile)   Exercises To Do While Sitting  Exercises that you do while sitting (chair exercises) can give you many of the same benefits as full exercise. Benefits include strengthening your  heart, burning calories, and keeping muscles and joints healthy. Exercise can also improve your mood and help with depression and anxiety. You may benefit from chair exercises if you are unable to do standing exercises because of:  Diabetic foot pain.  Obesity.  Illness.  Arthritis.  Recovery from surgery or injury.  Breathing problems.  Balance problems.  Another type of disability. Before starting chair exercises, check with your health care provider or a physical therapist to find out how much exercise you can tolerate and which exercises are safe for you. If your health care provider approves:  Start out slowly and build up over time. Aim to work up to about 10-20 minutes for each exercise session.  Make exercise part of your daily routine.  Drink water when you exercise. Do not wait until you are thirsty. Drink every 10-15 minutes.  Stop exercising right away if you have pain, nausea, shortness of breath, or dizziness.  If you are exercising in a wheelchair, make sure to lock the wheels.  Ask your health care provider whether you can do tai chi or yoga. Many positions in these mind-body exercises can be modified to do while seated. Warm-up Before starting other exercises: 1. Sit up as straight as you can. Have your knees bent at 90 degrees, which is the shape of the capital letter "L." Keep your feet flat on the floor. 2. Sit at the front edge of your chair, if you can. 3. Pull in (tighten) the muscles in your abdomen and stretch your spine and neck as straight as you can. Hold this position for a few minutes. 4. Breathe in and out evenly. Try to concentrate on your breathing,  and relax your mind. Stretching Exercise A: Arm stretch 1. Hold your arms out straight in front of your body. 2. Bend your hands at the wrist with your fingers pointing up, as if signaling someone to stop. Notice the slight tension in your forearms as you hold the position. 3. Keeping your arms out  and your hands bent, rotate your hands outward as far as you can and hold this stretch. Aim to have your thumbs pointing up and your pinkie fingers pointing down. Slowly repeat arm stretches for one minute as tolerated. Exercise B: Leg stretch 1. If you can move your legs, try to "draw" letters on the floor with the toes of your foot. Write your name with one foot. 2. Write your name with the toes of your other foot. Slowly repeat the movements for one minute as tolerated. Exercise C: Reach for the sky 1. Reach your hands as far over your head as you can to stretch your spine. 2. Move your hands and arms as if you are climbing a rope. Slowly repeat the movements for one minute as tolerated. Range of motion exercises Exercise A: Shoulder roll 1. Let your arms hang loosely at your sides. 2. Lift just your shoulders up toward your ears, then let them relax back down. 3. When your shoulders feel loose, rotate your shoulders in backward and forward circles. Do shoulder rolls slowly for one minute as tolerated. Exercise B: March in place 1. As if you are marching, pump your arms and lift your legs up and down. Lift your knees as high as you can. ? If you are unable to lift your knees, just pump your arms and move your ankles and feet up and down. March in place for one minute as tolerated. Exercise C: Seated jumping jacks 1. Let your arms hang down straight. 2. Keeping your arms straight, lift them up over your head. Aim to point your fingers to the ceiling. 3. While you lift your arms, straighten your legs and slide your heels along the floor to your sides, as wide as you can. 4. As you bring your arms back down to your sides, slide your legs back together. ? If you are unable to use your legs, just move your arms. Slowly repeat seated jumping jacks for one minute as tolerated. Strengthening exercises Exercise A: Shoulder squeeze 1. Hold your arms straight out from your body to your sides,  with your elbows bent and your fists pointed at the ceiling. 2. Keeping your arms in the bent position, move them forward so your elbows and forearms meet in front of your face. 3. Open your arms back out as wide as you can with your elbows still bent, until you feel your shoulder blades squeezing together. Hold for 5 seconds. Slowly repeat the movements forward and backward for one minute as tolerated. Contact a health care provider if you:  Had to stop exercising due to any of the following: ? Pain. ? Nausea. ? Shortness of breath. ? Dizziness. ? Fatigue.  Have significant pain or soreness after exercising. Get help right away if you have:  Chest pain.  Difficulty breathing. These symptoms may represent a serious problem that is an emergency. Do not wait to see if the symptoms will go away. Get medical help right away. Call your local emergency services (911 in the [place] Do not drive yourself to the hospital. This information is not intended to replace advice given to you by your health care provider. Make  sure you discuss any questions you have with your health care provider. Document Revised: 01/05/2019 Document Reviewed: 07/28/2017 Elsevier Patient Education  2020 [employer].

## 2020-06-18 ENCOUNTER — Telehealth: Payer: Medicare Other

## 2020-07-01 ENCOUNTER — Telehealth: Payer: Self-pay | Admitting: Family Medicine

## 2020-07-01 DIAGNOSIS — D638 Anemia in other chronic diseases classified elsewhere: Secondary | ICD-10-CM

## 2020-07-01 DIAGNOSIS — E1121 Type 2 diabetes mellitus with diabetic nephropathy: Secondary | ICD-10-CM

## 2020-07-01 DIAGNOSIS — E559 Vitamin D deficiency, unspecified: Secondary | ICD-10-CM

## 2020-07-01 NOTE — Telephone Encounter (Signed)
-----   Message from Ellamae Sia sent at 06/18/2020  2:38 PM EDT ----- Regarding: Lab orders for Tuesday, 10.5.21  AWV lab orders, please.

## 2020-07-02 ENCOUNTER — Ambulatory Visit (INDEPENDENT_AMBULATORY_CARE_PROVIDER_SITE_OTHER): Payer: Medicare Other

## 2020-07-02 ENCOUNTER — Other Ambulatory Visit (INDEPENDENT_AMBULATORY_CARE_PROVIDER_SITE_OTHER): Payer: Medicare Other

## 2020-07-02 ENCOUNTER — Other Ambulatory Visit: Payer: Self-pay

## 2020-07-02 VITALS — Wt 164.0 lb

## 2020-07-02 DIAGNOSIS — E559 Vitamin D deficiency, unspecified: Secondary | ICD-10-CM | POA: Diagnosis not present

## 2020-07-02 DIAGNOSIS — Z0000 Encounter for general adult medical examination without abnormal findings: Secondary | ICD-10-CM | POA: Diagnosis not present

## 2020-07-02 DIAGNOSIS — D638 Anemia in other chronic diseases classified elsewhere: Secondary | ICD-10-CM

## 2020-07-02 DIAGNOSIS — E1121 Type 2 diabetes mellitus with diabetic nephropathy: Secondary | ICD-10-CM | POA: Diagnosis not present

## 2020-07-02 LAB — CBC WITH DIFFERENTIAL/PLATELET
Basophils Absolute: 0.1 10*3/uL (ref 0.0–0.1)
Basophils Relative: 0.9 % (ref 0.0–3.0)
Eosinophils Absolute: 0.1 10*3/uL (ref 0.0–0.7)
Eosinophils Relative: 1.8 % (ref 0.0–5.0)
HCT: 40.4 % (ref 36.0–46.0)
Hemoglobin: 13.7 g/dL (ref 12.0–15.0)
Lymphocytes Relative: 22.5 % (ref 12.0–46.0)
Lymphs Abs: 1.8 10*3/uL (ref 0.7–4.0)
MCHC: 33.8 g/dL (ref 30.0–36.0)
MCV: 91.9 fl (ref 78.0–100.0)
Monocytes Absolute: 0.6 10*3/uL (ref 0.1–1.0)
Monocytes Relative: 6.9 % (ref 3.0–12.0)
Neutro Abs: 5.6 10*3/uL (ref 1.4–7.7)
Neutrophils Relative %: 67.9 % (ref 43.0–77.0)
Platelets: 208 10*3/uL (ref 150.0–400.0)
RBC: 4.4 Mil/uL (ref 3.87–5.11)
RDW: 13.4 % (ref 11.5–15.5)
WBC: 8.2 10*3/uL (ref 4.0–10.5)

## 2020-07-02 LAB — IBC + FERRITIN
Ferritin: 304.9 ng/mL — ABNORMAL HIGH (ref 10.0–291.0)
Iron: 138 ug/dL (ref 42–145)
Saturation Ratios: 40.9 % (ref 20.0–50.0)
Transferrin: 241 mg/dL (ref 212.0–360.0)

## 2020-07-02 LAB — COMPREHENSIVE METABOLIC PANEL
ALT: 12 U/L (ref 0–35)
AST: 18 U/L (ref 0–37)
Albumin: 4.4 g/dL (ref 3.5–5.2)
Alkaline Phosphatase: 61 U/L (ref 39–117)
BUN: 24 mg/dL — ABNORMAL HIGH (ref 6–23)
CO2: 23 mEq/L (ref 19–32)
Calcium: 8.6 mg/dL (ref 8.4–10.5)
Chloride: 107 mEq/L (ref 96–112)
Creatinine, Ser: 1.77 mg/dL — ABNORMAL HIGH (ref 0.40–1.20)
GFR: 26.61 mL/min — ABNORMAL LOW (ref >60.00)
Glucose, Bld: 117 mg/dL — ABNORMAL HIGH (ref 70–99)
Potassium: 4.1 mEq/L (ref 3.5–5.1)
Sodium: 140 mEq/L (ref 135–145)
Total Bilirubin: 1.1 mg/dL (ref 0.2–1.2)
Total Protein: 6.7 g/dL (ref 6.0–8.3)

## 2020-07-02 LAB — VITAMIN D 25 HYDROXY (VIT D DEFICIENCY, FRACTURES): VITD: 31.41 ng/mL (ref 30.00–100.00)

## 2020-07-02 LAB — LIPID PANEL
Cholesterol: 108 mg/dL (ref 0–200)
HDL: 42.1 mg/dL (ref >39.00)
LDL Cholesterol: 39 mg/dL (ref 0–99)
NonHDL: 66.06
Total CHOL/HDL Ratio: 3
Triglycerides: 137 mg/dL (ref 0.0–149.0)
VLDL: 27.4 mg/dL (ref 0.0–40.0)

## 2020-07-02 LAB — HEMOGLOBIN A1C: Hgb A1c MFr Bld: 6.4 % (ref 4.6–6.5)

## 2020-07-02 NOTE — Progress Notes (Signed)
No critical labs need to be addressed urgently. We will discuss labs in detail at upcoming office visit.   

## 2020-07-02 NOTE — Patient Instructions (Signed)
Tonya [name] , Thank you for taking time to come for your Medicare Wellness Visit. I appreciate your ongoing commitment to your health goals. Please review the following plan we discussed and let me know if I can assist you in the future.   Screening recommendations/referrals: Colonoscopy: Up to date, completed 07/19/2018, due 06/2021 Mammogram: Up to date, completed 11/10/2019, due 10/2020 Bone Density: due, will discuss with provider Recommended yearly ophthalmology/optometry visit for glaucoma screening and checkup Recommended yearly dental visit for hygiene and checkup  Vaccinations: Influenza vaccine: Up to date, completed 05/16/2020, due 04/2021 Pneumococcal vaccine: Completed series Tdap vaccine: decline (insurance) Shingles vaccine: due, check with your insurance regarding coverage    Covid-19:Completed series  Advanced directives: Please bring a copy of your POA (Power of Attorney) and/or Living Will to your next appointment.   Conditions/risks identified: diabetes, hypertension, hyperlipidemia  Next appointment: Follow up in one year for your annual wellness visit    Preventive Care 80 Years and Older, Female Preventive care refers to lifestyle choices and visits with your health care provider that can promote health and wellness. What does preventive care include?  A yearly physical exam. This is also called an annual well check.  Dental exams once or twice a year.  Routine eye exams. Ask your health care provider how often you should have your eyes checked.  Personal lifestyle choices, including:  Daily care of your teeth and gums.  Regular physical activity.  Eating a healthy diet.  Avoiding tobacco and drug use.  Limiting alcohol use.  Practicing safe sex.  Taking low-dose aspirin every day.  Taking vitamin and mineral supplements as recommended by your health care provider. What happens during an annual well check? The services and screenings done by your  health care provider during your annual well check will depend on your age, overall health, lifestyle risk factors, and family history of disease. Counseling  Your health care provider may ask you questions about your:  Alcohol use.  Tobacco use.  Drug use.  Emotional well-being.  Home and relationship well-being.  Sexual activity.  Eating habits.  History of falls.  Memory and ability to understand (cognition).  Work and work Statistician.  Reproductive health. Screening  You may have the following tests or measurements:  Height, weight, and BMI.  Blood pressure.  Lipid and cholesterol levels. These may be checked every 5 years, or more frequently if you are over 80 years old.  Skin check.  Lung cancer screening. You may have this screening every year starting at age 80 if you have a 30-pack-year history of smoking and currently smoke or have quit within the past 15 years.  Fecal occult blood test (FOBT) of the stool. You may have this test every year starting at age 80.  Flexible sigmoidoscopy or colonoscopy. You may have a sigmoidoscopy every 5 years or a colonoscopy every 10 years starting at age 80.  Hepatitis C blood test.  Hepatitis B blood test.  Sexually transmitted disease (STD) testing.  Diabetes screening. This is done by checking your blood sugar (glucose) after you have not eaten for a while (fasting). You may have this done every 1-3 years.  Bone density scan. This is done to screen for osteoporosis. You may have this done starting at age 80.  Mammogram. This may be done every 1-2 years. Talk to your health care provider about how often you should have regular mammograms. Talk with your health care provider about your test results, treatment options, and  if necessary, the need for more tests. Vaccines  Your health care provider may recommend certain vaccines, such as:  Influenza vaccine. This is recommended every year.  Tetanus, diphtheria, and  acellular pertussis (Tdap, Td) vaccine. You may need a Td booster every 10 years.  Zoster vaccine. You may need this after age 80.  Pneumococcal 13-valent conjugate (PCV13) vaccine. One dose is recommended after age 80.  Pneumococcal polysaccharide (PPSV23) vaccine. One dose is recommended after age 80. Talk to your health care provider about which screenings and vaccines you need and how often you need them. This information is not intended to replace advice given to you by your health care provider. Make sure you discuss any questions you have with your health care provider. Document Released: 10/11/2015 Document Revised: 06/03/2016 Document Reviewed: 07/16/2015 Elsevier Interactive Patient Education  2017 [place] Prevention in the Home Falls can cause injuries. They can happen to people of all ages. There are many things you can do to make your home safe and to help prevent falls. What can I do on the outside of my home?  Regularly fix the edges of walkways and driveways and fix any cracks.  Remove anything that might make you trip as you walk through a door, such as a raised step or threshold.  Trim any bushes or trees on the path to your home.  Use bright outdoor lighting.  Clear any walking paths of anything that might make someone trip, such as rocks or tools.  Regularly check to see if handrails are loose or broken. Make sure that both sides of any steps have handrails.  Any raised decks and porches should have guardrails on the edges.  Have any leaves, snow, or ice cleared regularly.  Use sand or salt on walking paths during winter.  Clean up any spills in your garage right away. This includes oil or grease spills. What can I do in the bathroom?  Use night lights.  Install grab bars by the toilet and in the tub and shower. Do not use towel bars as grab bars.  Use non-skid mats or decals in the tub or shower.  If you need to sit down in the shower, use  a plastic, non-slip stool.  Keep the floor dry. Clean up any water that spills on the floor as soon as it happens.  Remove soap buildup in the tub or shower regularly.  Attach bath mats securely with double-sided non-slip rug tape.  Do not have throw rugs and other things on the floor that can make you trip. What can I do in the bedroom?  Use night lights.  Make sure that you have a light by your bed that is easy to reach.  Do not use any sheets or blankets that are too big for your bed. They should not hang down onto the floor.  Have a firm chair that has side arms. You can use this for support while you get dressed.  Do not have throw rugs and other things on the floor that can make you trip. What can I do in the kitchen?  Clean up any spills right away.  Avoid walking on wet floors.  Keep items that you use a lot in easy-to-reach places.  If you need to reach something above you, use a strong step stool that has a grab bar.  Keep electrical cords out of the way.  Do not use floor polish or wax that makes floors slippery. If you  must use wax, use non-skid floor wax.  Do not have throw rugs and other things on the floor that can make you trip. What can I do with my stairs?  Do not leave any items on the stairs.  Make sure that there are handrails on both sides of the stairs and use them. Fix handrails that are broken or loose. Make sure that handrails are as long as the stairways.  Check any carpeting to make sure that it is firmly attached to the stairs. Fix any carpet that is loose or worn.  Avoid having throw rugs at the top or bottom of the stairs. If you do have throw rugs, attach them to the floor with carpet tape.  Make sure that you have a light switch at the top of the stairs and the bottom of the stairs. If you do not have them, ask someone to add them for you. What else can I do to help prevent falls?  Wear shoes that:  Do not have high heels.  Have  rubber bottoms.  Are comfortable and fit you well.  Are closed at the toe. Do not wear sandals.  If you use a stepladder:  Make sure that it is fully opened. Do not climb a closed stepladder.  Make sure that both sides of the stepladder are locked into place.  Ask someone to hold it for you, if possible.  Clearly mark and make sure that you can see:  Any grab bars or handrails.  First and last steps.  Where the edge of each step is.  Use tools that help you move around (mobility aids) if they are needed. These include:  Canes.  Walkers.  Scooters.  Crutches.  Turn on the lights when you go into a dark area. Replace any light bulbs as soon as they burn out.  Set up your furniture so you have a clear path. Avoid moving your furniture around.  If any of your floors are uneven, fix them.  If there are any pets around you, be aware of where they are.  Review your medicines with your doctor. Some medicines can make you feel dizzy. This can increase your chance of falling. Ask your doctor what other things that you can do to help prevent falls. This information is not intended to replace advice given to you by your health care provider. Make sure you discuss any questions you have with your health care provider. Document Released: 07/11/2009 Document Revised: 02/20/2016 Document Reviewed: 10/19/2014 Elsevier Interactive Patient Education  2017 [employer].

## 2020-07-02 NOTE — Progress Notes (Signed)
PCP notes:  Health Maintenance: Tdap- insurance Dexa- due   Abnormal Screenings: none   Patient concerns: none   Nurse concerns: none   Next PCP appt.: 07/11/2020 @ 9:40 am

## 2020-07-02 NOTE — Progress Notes (Addendum)
Subjective:   Tonya [name] is a 79 y.o. female who presents for Medicare Annual (Subsequent) preventive examination.  Review of Systems: N/A      I connected with the patient today by telephone and verified that I am speaking with the correct person using two identifiers. Location patient: home Location nurse: work Persons participating in the telephone visit: patient, nurse.   I discussed the limitations, risks, security and privacy concerns of performing an evaluation and management service by telephone and the availability of in person appointments. I also discussed with the patient that there may be a patient responsible charge related to this service. The patient expressed understanding and verbally consented to this telephonic visit.        Cardiac Risk Factors include: advanced age (>47mn, >>18women);diabetes mellitus;hypertension;Other (see comment), Risk factor comments: hyperlipidemia     Objective:    Today's Vitals   07/02/20 1025 07/02/20 1026  Weight: 164 lb (74.4 kg)   PainSc:  2    Body mass index is 26.88 kg/m.  Advanced Directives 07/02/2020 07/03/2019 02/10/2017 05/28/2016 05/22/2016 04/27/2016 11/20/2015  Does Patient Have a Medical Advance Directive? Yes _0  No  Type of AParamedicof ABandonLiving will - - - - - -  Copy of H[place] Chart? No - copy requested - - - - - -  Would patient like information on creating a medical advance directive? - Yes (MAU/Ambulatory/Procedural Areas - Information given) - No - patient declined information No - patient declined information - No - patient declined information  Pre-existing out of facility DNR order (yellow form or pink MOST form) - - - - - - -    Current Medications (verified) Outpatient Encounter Medications as of 07/02/2020  Medication Sig  . apixaban (ELIQUIS) 5 MG TABS tablet Take 1/2 tab twice daily  . atorvastatin (LIPITOR) 20 MG tablet TAKE 1  TABLET BY MOUTH EVERY DAY  . Blood Glucose Monitoring Suppl (ACCU-CHEK AVIVA PLUS) w/Device KIT Use to check blood once sugar daily.  . carvedilol (COREG) 12.5 MG tablet TAKE 1 TABLET (12.5 MG TOTAL) BY MOUTH 2 (TWO) TIMES DAILY WITH A MEAL.  . cetirizine (ZYRTEC) 10 MG tablet Take 10 mg by mouth daily as needed for allergies.  . cholestyramine light (PREVALITE) 4 g packet USE 1 PACKET TWICE DAILY WITH A MEAL, AT LEAST 2 HOURS AWAY FROM OTHER MEDS  . citalopram (CELEXA) 20 MG tablet TAKE 1 TABLET BY MOUTH EVERY DAY  . diclofenac sodium (VOLTAREN) 1 % GEL Apply 2 g topically 4 (four) times daily.  .Marland Kitchendiltiazem (CARDIZEM CD) 180 MG 24 hr capsule TAKE 1 CAPSULE BY MOUTH EVERY DAY  . ferrous sulfate 325 (65 FE) MG EC tablet Take 325 mg by mouth 2 (two) times daily.  .Marland KitchenglipiZIDE (GLUCOTROL) 5 MG tablet Take 2.5 mg by mouth 2 (two) times daily.   .Marland Kitchenglucose blood (ACCU-CHEK AVIVA PLUS) test strip Use to check blood sugar daily.  . isosorbide mononitrate (IMDUR) 30 MG 24 hr tablet Take 1 tablet (30 mg total) by mouth daily.  .Marland Kitchenlosartan (COZAAR) 100 MG tablet TAKE 1 TABLET BY MOUTH EVERY DAY  . omeprazole (PRILOSEC) 20 MG capsule Take 1 capsule (20 mg total) by mouth daily.  . Vitamin D, Ergocalciferol, (DRISDOL) 1.25 MG (50000 UNIT) CAPS capsule TAKE 1 CAPSULE (50,000 UNITS TOTAL) BY MOUTH EVERY 7 (SEVEN) DAYS.   No facility-administered encounter medications on file as of  07/02/2020.    Allergies (verified) Amoxicillin-pot clavulanate   History: Past Medical History:  Diagnosis Date  . Adenocarcinoma, colon ([place])   . Anemia    iron deficiency  . Anxiety   . Carpal tunnel syndrome   . Cataract    REMOVED BILATERAL  . Chronic diarrhea   . Colon polyps    Tubular adenoma  . Degenerative disk disease   . Depression   . Diabetes mellitus, type 2 ([place])   . Dysrhythmia    h/o AFIB  . GERD (gastroesophageal reflux disease)   . Hyperlipidemia   . Hypertension   . IBS (irritable bowel  syndrome)   . Osteoarthritis   . Osteoporosis   . S/P coronary artery stent placement 2007   Past Surgical History:  Procedure Laterality Date  . CARDIAC CATHETERIZATION  12/10   LAD stent patent. Insignificant CAD, otherwise EF 60-65%  . CARPAL TUNNEL RELEASE    . CATARACT EXTRACTION  03/2015  . CHOLECYSTECTOMY    . COLON SURGERY    . COLONOSCOPY    . DILATION AND CURETTAGE OF UTERUS    . HEMATOMA EVACUATION     after knee replacement  . KNEE ARTHROSCOPY     bilateral  . LUMBAR LAMINECTOMY N/A 11/26/2015   Procedure: LUMBAR DECOMPRESSIVE LAMINECTOMY L3-4 AND L4-5, LEFT L2-3 HEMILAMINECTOMY;  Surgeon: Jessy Oto, MD;  Location: [facility];  Service: Orthopedics;  Laterality: N/A;  . PARTIAL COLECTOMY  11/2008   right, for adenocarcinoma  . REPLACEMENT TOTAL KNEE  0/7622   right, complicated by hemarthrosis   . REVERSE SHOULDER ARTHROPLASTY Right 05/28/2016  . REVERSE SHOULDER ARTHROPLASTY Right 05/28/2016   Procedure: REVERSE SHOULDER ARTHROPLASTY;  Surgeon: Meredith Pel, MD;  Location: [facility];  Service: Orthopedics;  Laterality: Right;  . TONSILLECTOMY    . TOTAL KNEE ARTHROPLASTY Left 04/03/2014   dr whitfield  . TOTAL KNEE ARTHROPLASTY Left 04/03/2014   Procedure: TOTAL KNEE ARTHROPLASTY;  Surgeon: Garald Balding, MD;  Location: [facility];  Service: Orthopedics;  Laterality: Left;  . TOTAL KNEE REVISION  03/08/2012   Procedure: TOTAL KNEE REVISION;  Surgeon: Garald Balding, MD;  Location: [facility];  Service: Orthopedics;  Laterality: Right;  removal total knee hardware and placement of antibiotic cement spacer and antibiotic beads  . TOTAL KNEE REVISION  05/17/2012   Procedure: TOTAL KNEE REVISION;  Surgeon: Garald Balding, MD;  Location: [facility];  Service: Orthopedics;  Laterality: Right;  right total knee revision, removal of antibiotic spacer   Family History  Problem Relation Age of Onset  . Heart disease Mother        massive MI age 54  . Heart attack Mother   . Heart  disease Father        Massive MI age 12  . Prostate cancer Father   . Diabetes Paternal Grandmother   . Colon polyps Sister   . Colon cancer Neg Hx    Social History   Socioeconomic History  . Marital status: Divorced    Spouse name: Not on file  . Number of children: 3  . Years of education: Not on file  . Highest education level: Not on file  Occupational History  . Occupation: retired    Fish farm manager: RETIRED  Tobacco Use  . Smoking status: Never Smoker  . Smokeless tobacco: Never Used  Vaping Use  . Vaping Use: Never used  Substance and Sexual Activity  . Alcohol use: No    Alcohol/week: 0.0  standard drinks  . Drug use: No  . Sexual activity: Never  Other Topics Concern  . Not on file  Social History Narrative   No regular exercise, limited due to knees   Diet- addicted to sweets, some fruit and veggies, water daily.   Social Determinants of Health   Financial Resource Strain: Low Risk   . Difficulty of Paying Living Expenses: Not hard at all  Food Insecurity: No Food Insecurity  . Worried About Charity fundraiser in the Last Year: Never true  . Ran Out of Food in the Last Year: Never true  Transportation Needs: No Transportation Needs  . Lack of Transportation (Medical): No  . Lack of Transportation (Non-Medical): No  Physical Activity: Inactive  . Days of Exercise per Week: 0 days  . Minutes of Exercise per Session: 0 min  Stress: No Stress Concern Present  . Feeling of Stress : Not at all  Social Connections:   . Frequency of Communication with Friends and Family: Not on file  . Frequency of Social Gatherings with Friends and Family: Not on file  . Attends Religious Services: Not on file  . Active Member of Clubs or Organizations: Not on file  . Attends Archivist Meetings: Not on file  . Marital Status: Not on file    Tobacco Counseling Counseling given: Not Answered   Clinical Intake:  Pre-visit preparation completed: Yes  Pain :  0-10 Pain Score: 2  Pain Type: Chronic pain Pain Location:  (foot) Pain Orientation: Right Pain Descriptors / Indicators: Aching Pain Onset: More than a month ago Pain Frequency: Intermittent     Nutritional Risks: Nausea/ vomitting/ diarrhea (diarrhea constantly) Diabetes: Yes CBG done?: No Did pt. bring in CBG monitor from home?: No  How often do you need to have someone help you when you read instructions, pamphlets, or other written materials from your doctor or pharmacy?: 1 - Never What is the last grade level you completed in school?: some college  Diabetic: Yes Nutrition Risk Assessment:  Has the patient had any N/V/D within the last 2 months?  Yes , diarrhea constantly Does the patient have any non-healing wounds?  No  Has the patient had any unintentional weight loss or weight gain?  No   Diabetes:  Is the patient diabetic?  Yes  If diabetic, was a CBG obtained today?  No  Did the patient bring in their glucometer from home?  No  How often do you monitor your CBG's? occasionally.   Financial Strains and Diabetes Management:  Are you having any financial strains with the device, your supplies or your medication? No .  Does the patient want to be seen by Chronic Care Management for management of their diabetes?  No  Would the patient like to be referred to a Nutritionist or for Diabetic Management?  No   Diabetic Exams:  Diabetic Eye Exam: Completed 09/12/2019 Diabetic Foot Exam: Completed 01/01/2020   Interpreter Needed?: No  Information entered by :: CJohnson, LPN   Activities of Daily Living In your present state of health, do you have any difficulty performing the following activities: 07/02/2020 07/03/2019  Hearing? N N  Vision? N N  Difficulty concentrating or making decisions? N N  Walking or climbing stairs? N N  Dressing or bathing? N N  Doing errands, shopping? N N  Preparing Food and eating ? N N  Using the Toilet? N N  In the past six months,  have you accidently leaked  urine? Y Y  Comment wears pads wears a pad daily  Do you have problems with loss of bowel control? N N  Managing your Medications? N N  Managing your Finances? N N  Housekeeping or managing your Housekeeping? N N  Some recent data might be hidden    Patient Care Team: [name], MD as PCP - General [name], [facility] as Pharmacist (Pharmacist)  Indicate any recent Medical Services you may have received from other than [facility] providers in the past year (date may be approximate).     Assessment:   This is a routine wellness examination for Jesusa.  Hearing/Vision screen  Hearing Screening   125Hz 250Hz 500Hz 1000Hz 2000Hz 3000Hz 4000Hz 6000Hz 8000Hz  Right ear:           Left ear:           Vision Screening Comments: Patient gets annual eye exams   Dietary issues and exercise activities discussed: Current Exercise Habits: The patient does not participate in regular exercise at present, Exercise limited by: None identified  Goals    .  medication management (pt-stated)      Starting 02/10/2017, I will continue to take medications as prescribed.     .  Patient Stated      07/03/2019, Patient wants to increase her social activity with friends and family.     .  Patient Stated      07/02/2020, I will maintain and continue medications as prescribed.     .  Pharmacy Care Plan      CARE PLAN ENTRY  Current Barriers:  . Chronic Disease Management support, education, and care coordination needs related to hypertension, hyperlipidemia, type 2 diabetes, atrial fibrillation, coronary artery disease, allergic rhinitis, GERD, chronic kidney disease, osteoarthritis, anxiety, depression  Pharmacist Clinical Goal(s):  Marland Kitchen Maintain blood glucose control with A1c less than 7%. Dr. Diona Browner discontinued glipizide. Patient is monitoring blood sugar and current readings 70-80 mg/dL. Patient watches diet closely and avoids snacking.  . Maintain blood pressure within  goal of less than 140/90 mmHg. If home blood pressure is elevated, please contact pharmacist. . Reduce medication burden. May discontinue omeprazole permanently if no symptoms.  . Remain up to date on vaccinations. Recommend receiving your tetanus vaccine (Tdap) as it has been more than 10 years since your last tetanus booster. Also recommend 2 dose series of shingles vaccine (Shingrix). Please see your local pharmacy to ask about these vaccines.   Interventions: . Comprehensive medication review performed.  Patient Self Care Activities:  . Self administers medications as prescribed  Please see past updates related to this goal by clicking on the "Past Updates" button in the selected goal       Depression Screen PHQ 2/9 Scores 07/02/2020 07/03/2019 03/01/2018 02/26/2017 02/10/2017 10/29/2015 10/26/2014  PHQ - 2 Score 0 0 0 0 0 1 1  PHQ- 9 Score 0 0 - - - - -    Fall Risk Fall Risk  07/02/2020 07/03/2019 03/01/2018 02/10/2017 10/29/2015  Falls in the past year? 0 0 Yes Yes No  Comment - - - pt fell while playing soccer and broke right arm; medical treatment including surgery -  Number falls in past yr: 0 - 1 1 -  Injury with Fall? 0 - - Yes -  Comment - - - - -  Risk Factor Category  - - - - -  Risk for fall due to : Medication side effect Medication side effect - - -  Follow up Falls evaluation completed;Falls prevention discussed Falls evaluation completed;Falls prevention discussed - - -    Any stairs in or around the home? Yes  If so, are there any without handrails? No  Home free of loose throw rugs in walkways, pet beds, electrical cords, etc? Yes  Adequate lighting in your home to reduce risk of falls? Yes   ASSISTIVE DEVICES UTILIZED TO PREVENT FALLS:  Life alert? No  Use of a cane, walker or w/c? No  Grab bars in the bathroom? No  Shower chair or bench in shower? No  Elevated toilet seat or a handicapped toilet? No   TIMED UP AND GO:  Was the test performed? N/A, telephonic  visit .    Cognitive Function: MMSE - Mini Mental State Exam 07/02/2020 07/03/2019 02/10/2017  Orientation to time _0 Orientation to Place _1 Registration _2 Attention/ Calculation 5 5 0  Recall _3 Language- name 2 objects - - 0  Language- repeat _4 Language- follow 3 step command - - 3  Language- read & follow direction - - 0  Write a sentence - - 0  Copy design - - 0  Total score - - 20  Mini Cog  Mini-Cog screen was completed. Maximum score is 22. A value of 0 denotes this part of the MMSE was not completed or the patient failed this part of the Mini-Cog screening.       Immunizations Immunization History  Administered Date(s) Administered  . Influenza Split 06/29/2011, 07/08/2011, 06/17/2013  . Influenza Whole 07/01/2007, 06/18/2008, 07/12/2009, 07/17/2010  . Influenza, High Dose Seasonal PF 06/25/2014, 06/10/2015, 06/06/2016, 05/26/2017, 05/28/2018, 05/21/2019, 05/16/2020  . Moderna SARS-COVID-2 Vaccination 01/25/2020, 02/22/2020  . Pneumococcal Conjugate-13 10/26/2014  . Pneumococcal Polysaccharide-23 09/29/2003, 05/20/2012  . Td 07/12/2009    TDAP status: Due, Education has been provided regarding the importance of this vaccine. Advised may receive this vaccine at local pharmacy or Health Dept. Aware to provide a copy of the vaccination record if obtained from local pharmacy or Health Dept. Verbalized acceptance and understanding. Flu Vaccine status: Up to date Pneumococcal vaccine status: Up to date Covid-19 vaccine status: Completed vaccines  Qualifies for Shingles Vaccine? Yes   Zostavax completed No   Shingrix Completed?: No.    Education has been provided regarding the importance of this vaccine. Patient has been advised to call insurance company to determine out of pocket expense if they have not yet received this vaccine. Advised may also receive vaccine at local pharmacy or Health Dept. Verbalized acceptance and understanding.  Screening  Tests Health Maintenance  Topic Date Due  . TETANUS/TDAP  07/02/2024 (Originally 07/13/2019)  . OPHTHALMOLOGY EXAM  09/11/2020  . MAMMOGRAM  11/09/2020  . FOOT EXAM  12/31/2020  . HEMOGLOBIN A1C  12/31/2020  . COLONOSCOPY  07/19/2021  . INFLUENZA VACCINE  Completed  . DEXA SCAN  Completed  . COVID-19 Vaccine  Completed  . PNA vac Low Risk Adult  Completed    Health Maintenance  There are no preventive care reminders to display for this patient.  Colorectal cancer screening: Completed 07/19/2018. Repeat every 3 years Mammogram status: Completed 11/10/2019. Repeat every year Bone Density status: due, will discuss with provider first   Lung Cancer Screening: (Low Dose CT Chest recommended if Age 46-80 years, 30 pack-year currently smoking OR have quit w/in 15years.) does not qualify.    Additional Screening:  Hepatitis C Screening: does not  qualify; Completed N/A  Vision Screening: Recommended annual ophthalmology exams for early detection of glaucoma and other disorders of the eye. Is the patient up to date with their annual eye exam?  Yes  Who is the provider or what is the name of the office in which the patient attends annual eye exams? Dr. Katy Fitch  If pt is not established with a provider, would they like to be referred to a provider to establish care? No .   Dental Screening: Recommended annual dental exams for proper oral hygiene  Community Resource Referral / Chronic Care Management: CRR required this visit?  No   CCM required this visit?  No      Plan:     I have personally reviewed and noted the following in the patient's chart:   . Medical and social history . Use of alcohol, tobacco or illicit drugs  . Current medications and supplements . Functional ability and status . Nutritional status . Physical activity . Advanced directives . List of other physicians . Hospitalizations, surgeries, and ER visits in previous 12 months . Vitals . Screenings to  include cognitive, depression, and falls . Referrals and appointments  In addition, I have reviewed and discussed with patient certain preventive protocols, quality metrics, and best practice recommendations. A written personalized care plan for preventive services as well as general preventive health recommendations were provided to patient.   Due to this being a telephonic visit, the after visit summary with patients personalized plan was offered to patient via office or my-chart. Patient preferred to pick up at office at next visit or via mychart.   Jeanmarie, Mccowen, LPN   [id number]/5051

## 2020-07-03 ENCOUNTER — Other Ambulatory Visit: Payer: Self-pay | Admitting: Cardiology

## 2020-07-11 ENCOUNTER — Other Ambulatory Visit: Payer: Self-pay

## 2020-07-11 ENCOUNTER — Ambulatory Visit (INDEPENDENT_AMBULATORY_CARE_PROVIDER_SITE_OTHER): Payer: Medicare Other | Admitting: Family Medicine

## 2020-07-11 ENCOUNTER — Encounter: Payer: Self-pay | Admitting: Family Medicine

## 2020-07-11 VITALS — BP 126/76 | HR 98 | Temp 97.6°F | Ht 66.0 in | Wt 166.0 lb

## 2020-07-11 DIAGNOSIS — D692 Other nonthrombocytopenic purpura: Secondary | ICD-10-CM

## 2020-07-11 DIAGNOSIS — I152 Hypertension secondary to endocrine disorders: Secondary | ICD-10-CM

## 2020-07-11 DIAGNOSIS — E1121 Type 2 diabetes mellitus with diabetic nephropathy: Secondary | ICD-10-CM | POA: Diagnosis not present

## 2020-07-11 DIAGNOSIS — N184 Chronic kidney disease, stage 4 (severe): Secondary | ICD-10-CM

## 2020-07-11 DIAGNOSIS — E785 Hyperlipidemia, unspecified: Secondary | ICD-10-CM

## 2020-07-11 DIAGNOSIS — Z0000 Encounter for general adult medical examination without abnormal findings: Secondary | ICD-10-CM | POA: Diagnosis not present

## 2020-07-11 DIAGNOSIS — E1122 Type 2 diabetes mellitus with diabetic chronic kidney disease: Secondary | ICD-10-CM

## 2020-07-11 DIAGNOSIS — E1169 Type 2 diabetes mellitus with other specified complication: Secondary | ICD-10-CM

## 2020-07-11 DIAGNOSIS — E1159 Type 2 diabetes mellitus with other circulatory complications: Secondary | ICD-10-CM | POA: Diagnosis not present

## 2020-07-11 DIAGNOSIS — F331 Major depressive disorder, recurrent, moderate: Secondary | ICD-10-CM

## 2020-07-11 DIAGNOSIS — I482 Chronic atrial fibrillation, unspecified: Secondary | ICD-10-CM

## 2020-07-11 NOTE — Assessment & Plan Note (Signed)
Well controlled. Continue current medication.  

## 2020-07-11 NOTE — Assessment & Plan Note (Signed)
Continued good control off glipizide. Diet control.

## 2020-07-11 NOTE — Assessment & Plan Note (Signed)
Followed by cardiology.   Now on lower dose eliquis given age and GFR.

## 2020-07-11 NOTE — Assessment & Plan Note (Signed)
Due to DM , stable control. GFR 26-28

## 2020-07-11 NOTE — Assessment & Plan Note (Signed)
Continued to note frequent bruising.

## 2020-07-11 NOTE — Patient Instructions (Addendum)
Stop glipizide entirely.  When due for mammogram consider bone density as well.  Please call the location of your choice from the menu below to schedule your Mammogram and/or Bone Density appointment.    [place]   1. [facility]                      Phone:  [phone] [address], [place] [address]                                                             Services: Traditional and 3D Mammogram, Bone Density   2. [facility]                 Phone: [phone] [address], [place] [address]    Service: Bone Density ONLY   *this site does NOT perform mammograms  3. [place]                        Phone:  [phone] [address], [place] [address]                                            Services:  3D Mammogram and Bone Density    [place]  1. [place] [facility]   Phone:  [phone]   [place], [place] [address]                                            Services: 3D Mammogram and Bone Density  2. [place] [facility])  Phone:  [phone]   [address]  [place], [place] [address]                                              Services:  3D Mammogram and Bone Density  

## 2020-07-11 NOTE — Progress Notes (Signed)
Chief Complaint  Patient presents with  . Annual Exam    History of Present Illness: HPI  The patient presents for complete physical and review of chronic health problems. He/She also has the following acute concerns today: none The patient saw a LPN or RN for medicare wellness visit.  Prevention and wellness was reviewed in detail. Note reviewed and important notes copied below.  Health Maintenance: Tdap- insurance Dexa- due Abnormal Screenings: none   07/11/20  She has seem CCM pharmacist on 06/17/2020 note reviewed.  05/16/20: Cardiology - reduced dose of Eliquis to 2.5 mg due to creatinine above 1.5 and age 39. Requested CBC with next primary care visit.   Hemoglobin 13.7 and iron panel normal  Diabetes:   At goal even  using  glipizide off and on Lab Results  Component Value Date   HGBA1C 6.4 07/02/2020  Using medications without difficulties: Hypoglycemic episodes:none Hyperglycemic episodes: none Feet problems: no ulcers Blood Sugars averaging: 80-90 eye exam within last year: 08/2019   Hypertension:    Good control BP Readings from Last 3 Encounters:  07/11/20 126/76  05/16/20 124/78  03/05/20 138/84  Current meds listed:  Hydrochlorothiazide 25 mg - 1 tablet daily prn  Losartan 100 mg - 1 tablet daily  Carvedilol 12.5 mg - 1 tablet BID  Diltiazem 180 mg - 1 capsule daily   Isosorbide Mononitrate 30 mg - 1 tablet daily  Using medication without problems or lightheadedness:  none Chest pain with exertion:none Edema: left mild swelling off and on Short of breath:none Average home BPs: Other issues:   Elevated Cholesterol: Good control on atorvastatin  20 mg daily. Lab Results  Component Value Date   CHOL 108 07/02/2020   HDL 42.10 07/02/2020   LDLCALC 39 07/02/2020   LDLDIRECT 50.0 10/22/2014   TRIG 137.0 07/02/2020   CHOLHDL 3 07/02/2020  Using medications without problems: Muscle aches:  Diet compliance: good Exercise:  walking Other complaints:   Atrial fibrillation followed by Dr Percival Spanish... now on lower dose Eliquis given age and GFR.  Rate controlled on meds above.  MDD : good control on Celexa   Office Visit from 07/11/2020 in [place] at [facility] Total Score 0       This visit occurred during the SARS-CoV-2 public health emergency.  Safety protocols were in place, including screening questions prior to the visit, additional usage of staff PPE, and extensive cleaning of exam room while observing appropriate contact time as indicated for disinfecting solutions.   COVID 19 screen:  No recent travel or known exposure to COVID19 The patient denies respiratory symptoms of COVID 19 at this time. The importance of social distancing was discussed today.     Review of Systems  Constitutional: Negative for chills and fever.  HENT: Negative for congestion and ear pain.   Eyes: Negative for pain and redness.  Respiratory: Negative for cough and shortness of breath.   Cardiovascular: Negative for chest pain, palpitations and leg swelling.  Gastrointestinal: Negative for abdominal pain, blood in stool, constipation, diarrhea, nausea and vomiting.  Genitourinary: Negative for dysuria.  Musculoskeletal: Negative for falls and myalgias.  Skin: Negative for rash.  Neurological: Negative for dizziness.  Psychiatric/Behavioral: Negative for depression. The patient is not nervous/anxious.       Past Medical History:  Diagnosis Date  . Adenocarcinoma, colon ([place])   . Anemia    iron deficiency  . Anxiety   . Carpal tunnel syndrome   . Cataract  REMOVED BILATERAL  . Chronic diarrhea   . Colon polyps    Tubular adenoma  . Degenerative disk disease   . Depression   . Diabetes mellitus, type 2 (HCC)   . Dysrhythmia    h/o AFIB  . GERD (gastroesophageal reflux disease)   . Hyperlipidemia   . Hypertension   . IBS (irritable bowel syndrome)   . Osteoarthritis   . Osteoporosis    . S/P coronary artery stent placement 2007    reports that she has never smoked. She has never used smokeless tobacco. She reports that she does not drink alcohol and does not use drugs.   Current Outpatient Medications:  .  apixaban (ELIQUIS) 5 MG TABS tablet, Take 1/2 tab twice daily, Disp: 30 tablet, Rfl: 6 .  atorvastatin (LIPITOR) 20 MG tablet, TAKE 1 TABLET BY MOUTH EVERY DAY, Disp: 90 tablet, Rfl: 1 .  Blood Glucose Monitoring Suppl (ACCU-CHEK AVIVA PLUS) w/Device KIT, Use to check blood once sugar daily., Disp: 1 kit, Rfl: 0 .  carvedilol (COREG) 12.5 MG tablet, TAKE 1 TABLET (12.5 MG TOTAL) BY MOUTH 2 (TWO) TIMES DAILY WITH A MEAL., Disp: 180 tablet, Rfl: 3 .  cetirizine (ZYRTEC) 10 MG tablet, Take 10 mg by mouth daily as needed for allergies., Disp: , Rfl:  .  cholestyramine light (PREVALITE) 4 g packet, USE 1 PACKET TWICE DAILY WITH A MEAL, AT LEAST 2 HOURS AWAY FROM OTHER MEDS, Disp: 180 packet, Rfl: 0 .  citalopram (CELEXA) 20 MG tablet, TAKE 1 TABLET BY MOUTH EVERY DAY, Disp: 90 tablet, Rfl: 1 .  diclofenac sodium (VOLTAREN) 1 % GEL, Apply 2 g topically 4 (four) times daily., Disp: 100 g, Rfl: 3 .  diltiazem (CARDIZEM CD) 180 MG 24 hr capsule, TAKE 1 CAPSULE BY MOUTH EVERY DAY, Disp: 90 capsule, Rfl: 3 .  ferrous sulfate 325 (65 FE) MG EC tablet, Take 325 mg by mouth 2 (two) times daily., Disp: , Rfl:  .  glipiZIDE (GLUCOTROL) 5 MG tablet, Take 2.5 mg by mouth 2 (two) times daily. , Disp: , Rfl:  .  glucose blood (ACCU-CHEK AVIVA PLUS) test strip, Use to check blood sugar daily., Disp: 100 each, Rfl: 3 .  isosorbide mononitrate (IMDUR) 30 MG 24 hr tablet, TAKE 1 TABLET BY MOUTH EVERY DAY, Disp: 90 tablet, Rfl: 2 .  losartan (COZAAR) 100 MG tablet, TAKE 1 TABLET BY MOUTH EVERY DAY, Disp: 90 tablet, Rfl: 3 .  omeprazole (PRILOSEC) 20 MG capsule, Take 1 capsule (20 mg total) by mouth daily., Disp: 90 capsule, Rfl: 3 .  Vitamin D, Ergocalciferol, (DRISDOL) 1.25 MG (50000 UNIT) CAPS  capsule, TAKE 1 CAPSULE (50,000 UNITS TOTAL) BY MOUTH EVERY 7 (SEVEN) DAYS., Disp: 12 capsule, Rfl: 0   Observations/Objective: Blood pressure 126/76, pulse 98, temperature 97.6 F (36.4 C), height 5\' 6"  (1.676 m), weight 166 lb (75.3 kg), SpO2 96 %.  Physical Exam Constitutional:      General: She is not in acute distress.    Appearance: Normal appearance. She is well-developed. She is not ill-appearing or toxic-appearing.  HENT:     Head: Normocephalic.     Right Ear: Hearing, tympanic membrane, ear canal and external ear normal. Tympanic membrane is not erythematous, retracted or bulging.     Left Ear: Hearing, tympanic membrane, ear canal and external ear normal. Tympanic membrane is not erythematous, retracted or bulging.     Nose: No mucosal edema or rhinorrhea.     Right Sinus: No maxillary sinus  tenderness or frontal sinus tenderness.     Left Sinus: No maxillary sinus tenderness or frontal sinus tenderness.     Mouth/Throat:     Pharynx: Uvula midline.  Eyes:     General: Lids are normal. Lids are everted, no foreign bodies appreciated.     Conjunctiva/sclera: Conjunctivae normal.     Pupils: Pupils are equal, round, and reactive to light.  Neck:     Thyroid: No thyroid mass or thyromegaly.     Vascular: No carotid bruit.     Trachea: Trachea normal.  Cardiovascular:     Rate and Rhythm: Normal rate and regular rhythm.     Pulses: Normal pulses.     Heart sounds: Normal heart sounds, S1 normal and S2 normal. No murmur heard.  No friction rub. No gallop.   Pulmonary:     Effort: Pulmonary effort is normal. No tachypnea or respiratory distress.     Breath sounds: Normal breath sounds. No decreased breath sounds, wheezing, rhonchi or rales.  Abdominal:     General: Bowel sounds are normal.     Palpations: Abdomen is soft.     Tenderness: There is no abdominal tenderness.  Musculoskeletal:     Cervical back: Normal range of motion and neck supple.  Skin:    General:  Skin is warm and dry.     Findings: No rash.  Neurological:     Mental Status: She is alert.  Psychiatric:        Mood and Affect: Mood is not anxious or depressed.        Speech: Speech normal.        Behavior: Behavior normal. Behavior is cooperative.        Thought Content: Thought content normal.        Judgment: Judgment normal.      Assessment and Plan The patient's preventative maintenance and recommended screening tests for an annual wellness exam were reviewed in full today. Brought up to date unless services declined.  Counselled on the importance of diet, exercise, and its role in overall health and mortality. The patient's FH and SH was reviewed, including their home life, tobacco status, and drug and alcohol status.   Patient Care Team: [name], MD as PCP - General Dr. Percival Spanish Cardiology  Dr. Ardis Hughs GI  Dr. Autumn Patty   DEXA 10/2014 normal. Repeat in 5 years, 2021 .. DUE, no sure she wants to do this.. will let me know Mammogram nml in 10/2019, she would like to continue these. Uptodate with colonoscopy with hx of colon cancer.. 06/2018 with Dr. Ardis Hughs repeat in 3 years. PAP/DVE not indicated at this age. No vaginal bleeding.  Nonsmoker  Vaccine: S/P Moderna COVID vaccine x 2 ( consider 3rd dose when available) and flu given 04/2020, uptodate except for shingles   Hypertension associated with diabetes ([place]) Well controlled. Continue current medication.   Hyperlipidemia associated with type 2 diabetes mellitus (HCC) Good control on atorvastatin  20 mg daily.  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 Diet control.  CKD stage 4 due to type 2 diabetes mellitus ([place]) Due to DM , stable control. GFR 26-28  Atrial fibrillation ([place])  Followed by cardiology.   Now on lower dose eliquis given age and GFR.  Major depressive disorder, recurrent episode, moderate (HCC) Well controlled.  Continue current medication.   Senile purpura ([place]) Continued to note frequent bruising.   [name], MD

## 2020-07-11 NOTE — Assessment & Plan Note (Signed)
Good control on atorvastatin  20 mg daily.

## 2020-07-12 ENCOUNTER — Other Ambulatory Visit: Payer: Self-pay | Admitting: Gastroenterology

## 2020-07-24 ENCOUNTER — Other Ambulatory Visit: Payer: Self-pay | Admitting: Family Medicine

## 2020-08-04 ENCOUNTER — Other Ambulatory Visit: Payer: Self-pay | Admitting: Family Medicine

## 2020-08-04 ENCOUNTER — Other Ambulatory Visit: Payer: Self-pay | Admitting: Gastroenterology

## 2020-08-04 DIAGNOSIS — I4821 Permanent atrial fibrillation: Secondary | ICD-10-CM

## 2020-08-05 NOTE — Telephone Encounter (Signed)
Last office visit 07/11/2020 for CPE.  Last refilled 09/202/021 for #12 with no refills.  Vit D level 31.41 ng/ml on 07/02/2020.  Refill?

## 2020-08-09 ENCOUNTER — Other Ambulatory Visit: Payer: Self-pay

## 2020-08-09 MED ORDER — CHOLESTYRAMINE LIGHT 4 G PO PACK: PACK | ORAL | 0 refills | Status: DC

## 2020-08-12 ENCOUNTER — Telehealth: Payer: Self-pay

## 2020-08-12 NOTE — Progress Notes (Addendum)
Chronic Care Management Pharmacy Assistant   Name: Tonya [name]  MRN: [id number] DOB: 22-Aug-1941  Reason for Encounter: Disease State  Patient Questions:  1.  Have you seen any other providers since your last visit? Yes, Amy Bledsole  2.  Any changes in your medicines or health? No   Tonya [name],  79 y.o. , female presents for their Follow-Up CCM visit with the clinical pharmacist via telephone.  PCP : [name], MD  Allergies:   Allergies  Allergen Reactions   Amoxicillin-Pot Clavulanate Swelling and Other (See Comments)    Augmentin per patient, tongue swelling    Medications: Outpatient Encounter Medications as of 08/12/2020  Medication Sig   apixaban (ELIQUIS) 5 MG TABS tablet Take 1/2 tab twice daily   atorvastatin (LIPITOR) 20 MG tablet TAKE 1 TABLET BY MOUTH EVERY DAY   Blood Glucose Monitoring Suppl (ACCU-CHEK AVIVA PLUS) w/Device KIT Use to check blood once sugar daily.   carvedilol (COREG) 12.5 MG tablet TAKE 1 TABLET (12.5 MG TOTAL) BY MOUTH 2 (TWO) TIMES DAILY WITH A MEAL.   cetirizine (ZYRTEC) 10 MG tablet Take 10 mg by mouth daily as needed for allergies.   cholestyramine light (PREVALITE) 4 g packet USE 1 PACKET TWICE DAILY WITH A MEAL, AT LEAST 2 HOURS AWAY FROM OTHER MEDS   citalopram (CELEXA) 20 MG tablet TAKE 1 TABLET BY MOUTH EVERY DAY   diclofenac sodium (VOLTAREN) 1 % GEL Apply 2 g topically 4 (four) times daily.   diltiazem (CARDIZEM CD) 180 MG 24 hr capsule TAKE 1 CAPSULE BY MOUTH EVERY DAY   ferrous sulfate 325 (65 FE) MG EC tablet Take 325 mg by mouth 2 (two) times daily.   glucose blood (ACCU-CHEK AVIVA PLUS) test strip Use to check blood sugar daily.   isosorbide mononitrate (IMDUR) 30 MG 24 hr tablet TAKE 1 TABLET BY MOUTH EVERY DAY   losartan (COZAAR) 100 MG tablet TAKE 1 TABLET BY MOUTH EVERY DAY   omeprazole (PRILOSEC) 20 MG capsule Take 1 capsule (20 mg total) by mouth daily.   Vitamin D, Ergocalciferol, (DRISDOL) 1.25 MG (50000  UNIT) CAPS capsule TAKE 1 CAPSULE (50,000 UNITS TOTAL) BY MOUTH EVERY 7 (SEVEN) DAYS.   No facility-administered encounter medications on file as of 08/12/2020.    Current Diagnosis: Patient Active Problem List   Diagnosis Date Noted   Left leg swelling 03/05/2020   Hypertension associated with diabetes ([place]) 01/09/2020   Lightheadedness 01/09/2020   Plantar fasciitis, left 12/13/2019   Acquired trigger finger of left ring finger 07/11/2019   Osteoarthritis of both wrists 07/11/2019   Coronary artery disease involving native coronary artery of native heart without angina pectoris 10/24/2018   Senile purpura ([place]) 03/01/2018   History of total replacement of right shoulder joint 11/06/2016   Anemia of chronic disease 11/29/2015   Spinal stenosis, lumbar region, with neurogenic claudication 11/26/2015    Class: Chronic   Mixed incontinence urge and stress 10/29/2015   CKD stage 4 due to type 2 diabetes mellitus ([place]) 06/25/2015   Counseling regarding end of life decision making 10/26/2014   S/P total knee replacement using cement 04/03/2014   Atrial fibrillation ([place]) 10/17/2010   Allergic rhinitis 01/23/2009   ADENOCARCINOMA, COLON, CECUM 10/05/2008   Coronary atherosclerosis 11/23/2007   ANXIETY 09/19/2007   Major depressive disorder, recurrent episode, moderate ([place]) 09/19/2007   GERD 09/19/2007   Controlled type 2 diabetes mellitus with diabetic nephropathy ([place]) 03/21/2007   SYNDROME, CARPAL TUNNEL  03/21/2007   Hyperlipidemia associated with type 2 diabetes mellitus ([place]) 03/21/2007   IBS 03/21/2007   Osteoarthrosis, unspecified whether generalized or localized, unspecified site 03/21/2007    Goals Addressed   None     Follow-Up:  Pharmacist Review  .Marland Kitchen Reviewed chart prior to disease state call. Spoke with patient regarding BP  Recent Office Vitals: BP Readings from Last 3 Encounters:  07/11/20 126/76  05/16/20 124/78  03/05/20 138/84   Pulse Readings from Last  3 Encounters:  07/11/20 98  05/16/20 (!) 103  03/05/20 93    Wt Readings from Last 3 Encounters:  07/11/20 166 lb (75.3 kg)  07/02/20 164 lb (74.4 kg)  05/16/20 166 lb (75.3 kg)     Kidney Function Lab Results  Component Value Date/Time   CREATININE 1.77 (H) 07/02/2020 08:02 AM   CREATININE 1.70 (H) 01/02/2020 07:59 AM   CREATININE 1.42 (H) 01/11/2015 08:30 AM   CREATININE 1.30 (H) 12/26/2014 08:30 AM   GFR 26.61 (L) 07/02/2020 08:02 AM   GFRNONAA 26 (L) 11/15/2017 08:39 AM   GFRNONAA 36 (L) 01/11/2015 08:30 AM   GFRAA 30 (L) 11/15/2017 08:39 AM   GFRAA 42 (L) 01/11/2015 08:30 AM    BMP Latest Ref Rng & Units 07/02/2020 01/02/2020 07/03/2019  Glucose 70 - 99 mg/dL 117(H) 124(H) 132(H)  BUN 6 - 23 mg/dL 24(H) 28(H) 28(H)  Creatinine 0.40 - 1.20 mg/dL 1.77(H) 1.70(H) 1.88(H)  BUN/Creat Ratio 12 - 28 - - -  Sodium 135 - 145 mEq/L 140 140 140  Potassium 3.5 - 5.1 mEq/L 4.1 4.0 4.2  Chloride 96 - 112 mEq/L 107 105 105  CO2 19 - 32 mEq/L $Remove'23 27 27  '[id number]$ Calcium 8.4 - 10.5 mg/dL 8.6 9.1 9.0    Current antihypertensive regimen:  losartan (COZAAR) 100 MG tablet  isosorbide mononitrate (IMDUR) 30 MG 24 hr tablet  carvedilol (COREG) 12.5 MG tablet  diltiazem (CARDIZEM CD) 180 MG 24 hr capsule   How often are you checking your Blood Pressure? several times per month Current home BP readings: very rarely checking at home What recent interventions/DTPs have been made by any provider to improve Blood Pressure control since last CPP Visit: no Any recent hospitalizations or ED visits since last visit with CPP? No What diet changes have been made to improve Blood Pressure Control?  Cutting salt/ diet control What exercise is being done to improve your Blood Pressure Control?  Does laundry and walks up 1 flight of stairs multiple times a day.   .. Recent Relevant Labs: Lab Results  Component Value Date/Time   HGBA1C 6.4 07/02/2020 08:02 AM   HGBA1C 6.2 01/02/2020 07:59 AM   MICROALBUR  2.8 (H) 02/19/2011 10:44 AM   MICROALBUR 4.2 (H) 07/09/2010 08:26 AM    Kidney Function Lab Results  Component Value Date/Time   CREATININE 1.77 (H) 07/02/2020 08:02 AM   CREATININE 1.70 (H) 01/02/2020 07:59 AM   CREATININE 1.42 (H) 01/11/2015 08:30 AM   CREATININE 1.30 (H) 12/26/2014 08:30 AM   GFR 26.61 (L) 07/02/2020 08:02 AM   GFRNONAA 26 (L) 11/15/2017 08:39 AM   GFRNONAA 36 (L) 01/11/2015 08:30 AM   GFRAA 30 (L) 11/15/2017 08:39 AM   GFRAA 42 (L) 01/11/2015 08:30 AM    Current antihyperglycemic regimen:  Diet and exercise What recent interventions/DTPs have been made to improve glycemic control:  Diet Patient denies hypoglycemic symptoms, including None Patient denies hyperglycemic symptoms, including none How often are you checking your blood sugar? once daily  What are your blood sugars ranging?  Fasting: 79 During the week, how often does your blood glucose drop below 70? Never Are you checking your feet daily/regularly?   Adherence Review: Is the patient currently on a STATIN medication? Yes Is the patient currently on ACE/ARB medication? Yes Does the patient have >5 day gap between last estimated fill dates? No

## 2020-09-03 ENCOUNTER — Ambulatory Visit: Payer: Medicare Other

## 2020-09-03 ENCOUNTER — Other Ambulatory Visit: Payer: Self-pay

## 2020-09-03 DIAGNOSIS — I152 Hypertension secondary to endocrine disorders: Secondary | ICD-10-CM

## 2020-09-03 DIAGNOSIS — E1121 Type 2 diabetes mellitus with diabetic nephropathy: Secondary | ICD-10-CM

## 2020-09-03 NOTE — Patient Instructions (Addendum)
Dear Tonya [name],  Below is a summary of the goals we discussed during our follow up appointment on September 03, 2020. Please contact me anytime with questions or concerns.   Visit Information  Goals Addressed            This Visit's Progress   . Pharmacy Care Plan       CARE PLAN ENTRY  Current Barriers:  . Chronic Disease Management support, education, and care coordination needs related to hypertension, type 2 diabetes  Pharmacist Clinical Goal(s):  Marland Kitchen Diabetes: Maintain blood glucose control with A1c less than 7%. No pharmacotherapy. Managed with diet and exercise.  Marland Kitchen Hypertension: Maintain blood pressure within goal of less than 140/90 mmHg. . Remain up to date on vaccinations. Recommend receiving your tetanus vaccine (Tdap) as it has been more than 10 years since your last tetanus booster. Also recommend 2 dose series of shingles vaccine (Shingrix).   Interventions: . Comprehensive medication review performed. . Adherence reviewed . Medication access reviewed  Patient Self Care Activities:  . Self administers medications as prescribed  . If home blood pressure is elevated, please contact pharmacist. . Check price on Shingrix vaccine at your pharmacy.   Please see past updates related to this goal by clicking on the "Past Updates" button in the selected goal        The patient verbalized understanding of instructions, educational materials, and care plan provided today and declined offer to receive copy of patient instructions, educational materials, and care plan.   Telephone follow up appointment with pharmacy team member scheduled for:  03/06/20 at 8:30 AM  [name], PharmD Clinical Pharmacist [facility] [phone]    Basics of Medicine Management Taking your medicines correctly is an important part of managing or preventing medical problems. Make sure you know what disease or condition your medicine is treating, and how and  when to take it. If you do not take your medicine correctly, it may not work well and may cause unpleasant side effects, including serious health problems. What should I do when I am taking medicines?   Read all the labels and inserts that come with your medicines. Review the information often.  Talk with your pharmacist if you get a refill and notice a change in the size, color, or shape of your medicines.  Know the potential side effects for each medicine that you take.  Try to get all your medicines from the same pharmacy. The pharmacist will have all your information and will understand how your medicines will affect each other (interact).  Tell your health care provider about all your medicines, including over-the-counter medicines, vitamins, and herbal or dietary supplements. He or she will make sure that nothing will interact with any of your prescribed medicines. How can I take my medicines safely?  Take medicines only as told by your health care provider. ? Do not take more of your medicine than instructed. ? Do not take anyone else's medicines. ? Do not share your medicines with others. ? Do not stop taking your medicines unless your health care provider tells you to do so. ? You may need to avoid alcohol or certain foods or liquids when taking certain medicines. Follow your health care provider's instructions.  Do not split, mash, or chew your medicines unless your health care provider tells you to do so. Tell your health care provider if you have trouble swallowing your medicines.  For liquid medicine, use the dosing container  that was provided. How should I organize my medicines?  Know your medicines  Know what each of your medicines looks like. This includes size, color, and shape. Tell your health care provider if you are having trouble recognizing all the medicines that you are taking.  If you cannot tell your medicines apart because they look similar, keep them in  original bottles.  If you cannot read the labels on the bottles, tell your pharmacist to put your medicines in containers with large print.  Review your medicines and your schedule with family members, a friend, or a caregiver. Use a pill organizer  Use a tool to organize your medicine schedule. Tools include a weekly pillbox, a written chart, a notebook, or a calendar.  Your tool should help you remember the following things about each medicine: ? The name of the medicine. ? The amount (dose) to take. ? The schedule. This is the day and time the medicine should be taken. ? The appearance. This includes color, shape, size, and stamp. ? How to take your medicines. This includes instructions to take them with food, without food, with fluids, or with other medicines.  Create reminders for taking your medicines. Use sticky notes, or alarms on your watch, mobile device, or phone calendar.  You may choose to use a more advanced management system. These systems have storage, alarms, and visual and audio prompts.  Some medicines can be taken on an "as-needed" basis. These include medicines for nausea or pain. If you take an as-needed medicine, write down the name and dose, as well as the date and time that you took it. How should I plan for travel?  Take your pillbox, medicines, and organization system with you when traveling.  Have your medicines refilled before you travel. This will ensure that you do not run out of your medicines while you are away from home.  Always carry an updated list of your medicines with you. If there is an emergency, a first responder can quickly see what medicines you are taking.  Do not pack your medicines in checked luggage in case your luggage is lost or delayed.  If any of your medicines is considered a controlled substance, make sure you bring a letter from your health care provider with you. How should I store and discard my medicines? For safe  storage:  Store medicines in a cool, dry area away from light, or as directed by your health care provider. Do not store medicines in the bathroom. Heat and humidity will affect them.  Do not store your medicines with other chemicals, or with medicines for pets or other household members.  Keep medicines away from children and pets. Do not leave them on counters or bedside tables. Store them in high cabinets or on high shelves. For safe disposal:  Check expiration dates regularly. Do not take expired medicines. Discard medicines that are older than the expiration date.  Learn a safe way to dispose of your medicines. You may: ? Use a local government, hospital, or pharmacy medicine-take-back program. ? Mix the medicines with inedible substances, put them in a sealed bag or empty container, and throw them in the trash. What should I remember?  Tell your health care provider if you: ? Experience side effects. ? Have new symptoms. ? Have other concerns about taking your medicines.  Review your medicines regularly with your health care provider. Other medicines, diet, medical conditions, weight changes, and daily habits can all affect how medicines work. Ask  if you need to continue taking each medicine, and discuss how well each one is working.  Refill your medicines early to avoid running out of them.  In case of an accidental overdose, call your local [place] at [phone] or visit your local emergency department immediately. This is important. Summary  Taking your medicines correctly is an important part of managing or preventing medical problems.  You need to make sure that you understand what you are taking a medicine for, as well as how and when you need to take it.  Know your medicines and use a pill organizer to help you take your medicines correctly.  In case of an accidental overdose, call your local [place] at [phone] or visit your local  emergency department immediately. This is important. This information is not intended to replace advice given to you by your health care provider. Make sure you discuss any questions you have with your health care provider. Document Revised: 09/09/2017 Document Reviewed: 09/09/2017 Elsevier Patient Education  2020 [employer].

## 2020-09-03 NOTE — Chronic Care Management (AMB) (Signed)
Chronic Care Management Pharmacy  Name: Tonya [name]  MRN: [id number] DOB: Feb 23, 1941  Chief Complaint/ HPI  Tonya [name],  79 y.o., female presents for their Follow-Up CCM visit with the clinical pharmacist via telephone.  PCP : Tonya Sanders, MD  Their chronic conditions include: hypertension, hyperlipidemia, type 2 diabetes, atrial fibrillation, coronary artery disease, allergic rhinitis, GERD, chronic kidney disease, osteoarthritis, anxiety, depression  Patient concerns: denies medication concerns 09/03/20  Office Visits:  07/11/20: AWV - stop glipizide, consider bone density scan   06/17/20: CCM - review Eliquis cost concerns  03/05/20: left leg swelling - ordered ultrasound to rule out DVT (negative). Recommend compression hose. Plantar fasciitis - treat with ice, topical diclofenac and home PT. 01/09/20: d/c glipizide due to hypoglycemia risk. Change hydrochlorothiazide to PRN. If dizziness doesn't improve, consider dose decrease of diltiazem.   07/11/19: AWV - trial Voltaren gel for OA in wrist, continue current meds, shingles vaccine recommended  Consult Visit:  05/16/20: Cardiology - reduced dose of Eliquis to 2.5 mg due to creatinine above 1.5 and age 8. Requested CBC with next primary care visit.   12/13/19: Pain in left foot, plantar fasciitis - cortisone injection, ice, stretching   09/12/19: Eye exam  07/12/19: trigger finger - steroid injection  Allergies  Allergen Reactions  . Amoxicillin-Pot Clavulanate Swelling and Other (See Comments)    Augmentin per patient, tongue swelling   Medications: Outpatient Encounter Medications as of 09/03/2020  Medication Sig  . apixaban (ELIQUIS) 5 MG TABS tablet Take 1/2 tab twice daily  . atorvastatin (LIPITOR) 20 MG tablet TAKE 1 TABLET BY MOUTH EVERY DAY  . Blood Glucose Monitoring Suppl (ACCU-CHEK AVIVA PLUS) w/Device KIT Use to check blood once sugar daily.  . carvedilol (COREG) 12.5 MG tablet TAKE 1 TABLET  (12.5 MG TOTAL) BY MOUTH 2 (TWO) TIMES DAILY WITH A MEAL.  . cetirizine (ZYRTEC) 10 MG tablet Take 10 mg by mouth daily as needed for allergies.  . cholestyramine light (PREVALITE) 4 g packet USE 1 PACKET TWICE DAILY WITH A MEAL, AT LEAST 2 HOURS AWAY FROM OTHER MEDS  . citalopram (CELEXA) 20 MG tablet TAKE 1 TABLET BY MOUTH EVERY DAY  . diclofenac sodium (VOLTAREN) 1 % GEL Apply 2 g topically 4 (four) times daily.  Marland Kitchen diltiazem (CARDIZEM CD) 180 MG 24 hr capsule TAKE 1 CAPSULE BY MOUTH EVERY DAY  . ferrous sulfate 325 (65 FE) MG EC tablet Take 325 mg by mouth 2 (two) times daily.  Marland Kitchen glucose blood (ACCU-CHEK AVIVA PLUS) test strip Use to check blood sugar daily.  . isosorbide mononitrate (IMDUR) 30 MG 24 hr tablet TAKE 1 TABLET BY MOUTH EVERY DAY  . losartan (COZAAR) 100 MG tablet TAKE 1 TABLET BY MOUTH EVERY DAY  . omeprazole (PRILOSEC) 20 MG capsule Take 1 capsule (20 mg total) by mouth daily.  . Vitamin D, Ergocalciferol, (DRISDOL) 1.25 MG (50000 UNIT) CAPS capsule TAKE 1 CAPSULE (50,000 UNITS TOTAL) BY MOUTH EVERY 7 (SEVEN) DAYS.   No facility-administered encounter medications on file as of 09/03/2020.   Current Diagnosis/Assessment: Goals    .  medication management (pt-stated)      Starting 02/10/2017, I will continue to take medications as prescribed.     .  Patient Stated      07/03/2019, Patient wants to increase her social activity with friends and family.     .  Patient Stated      07/02/2020, I will maintain and continue medications as prescribed.     Marland Kitchen  Pharmacy Care Plan      CARE PLAN ENTRY  Current Barriers:  . Chronic Disease Management support, education, and care coordination needs related to hypertension, type 2 diabetes  Pharmacist Clinical Goal(s):  Marland Kitchen Diabetes: Maintain blood glucose control with A1c less than 7%. No pharmacotherapy. Managed with diet and exercise.  Marland Kitchen Hypertension: Maintain blood pressure within goal of less than 140/90 mmHg. . Remain up to date  on vaccinations. Recommend receiving your tetanus vaccine (Tdap) as it has been more than 10 years since your last tetanus booster. Also recommend 2 dose series of shingles vaccine (Shingrix).   Interventions: . Comprehensive medication review performed. . Adherence reviewed . Medication access reviewed  Patient Self Care Activities:  . Self administers medications as prescribed  . If home blood pressure is elevated, please contact pharmacist. . Check price on Shingrix vaccine at your pharmacy.   Please see past updates related to this goal by clicking on the "Past Updates" button in the selected goal       Diabetes   Recent Relevant Labs: Lab Results  Component Value Date/Time   HGBA1C 6.4 07/02/2020 08:02 AM   HGBA1C 6.2 01/02/2020 07:59 AM   MICROALBUR 2.8 (H) 02/19/2011 10:44 AM   MICROALBUR 4.2 (H) 07/09/2010 08:26 AM    Checking BG: none  Patient has failed these meds in past: none  Patient is currently controlled on the following medications:   No pharmacotherapy  Last diabetic eye exam:  Lab Results  Component Value Date/Time   HMDIABEYEEXA No Retinopathy 09/12/2019 12:00 AM    Last diabetic foot exam:  Lab Results  Component Value Date/Time   HMDIABFOOTEX done 01/01/2020 12:00 AM    Update 09/03/20: Pt remains off BG lowering therapy. She is checking BG occasionally, last BG was 99 about a week ago. Denies hypoglcyemia. Exercise - none formal but goes up and down stairs a lot taking care of grandson.  Plan: Continue current medications  Hypertension   CMP Latest Ref Rng & Units 07/02/2020 01/02/2020 07/03/2019  Glucose 70 - 99 mg/dL 117(H) 124(H) 132(H)  BUN 6 - 23 mg/dL 24(H) 28(H) 28(H)  Creatinine 0.40 - 1.20 mg/dL 1.77(H) 1.70(H) 1.88(H)  Sodium 135 - 145 mEq/L 140 140 140  Potassium 3.5 - 5.1 mEq/L 4.1 4.0 4.2  Chloride 96 - 112 mEq/L 107 105 105  CO2 19 - 32 mEq/L $Remove'23 27 27  'KSCqHDL$ Calcium 8.4 - 10.5 mg/dL 8.6 9.1 9.0  Total Protein 6.0 - 8.3 g/dL 6.7 6.7  6.6  Total Bilirubin 0.2 - 1.2 mg/dL 1.1 0.8 0.8  Alkaline Phos 39 - 117 U/L 61 63 64  AST 0 - 37 U/L $Remo'18 20 19  'KWgBQ$ ALT 0 - 35 U/L $Remo'12 17 14   'Hwbbj$ Office blood pressures are: BP Readings from Last 3 Encounters:  07/11/20 126/76  05/16/20 124/78  03/05/20 138/84   BP goal < 140/90 mmHg Patient has failed these meds in the past: none Patient checks BP at home infrequently   Patient is currently controlled on the following medications:   Hydrochlorothiazide 25 mg - 1 tablet daily PRN  Losartan 100 mg - 1 tablet daily  Carvedilol 12.5 mg - 1 tablet BID  Diltiazem 180 mg - 1 capsule daily   Isosorbide Mononitrate 30 mg - 1 tablet daily   Update 06/17/20 - Hasn't checked blood pressure lately. Blood pressure was good at the doctor last visit. Denies dizziness. Has some swelling in her left leg. Elevating her legs has improved swelling.  Patient reports medications are affordable and accessible.  Update 09/03/20: refills timely, < 5 day gap   Plan: Continue current medications  Hyperlipidemia/CAD   Lipid Panel     Component Value Date/Time   CHOL 108 07/02/2020 0802   TRIG 137.0 07/02/2020 0802   TRIG 240 (HH) 09/07/2006 0849   HDL 42.10 07/02/2020 0802   CHOLHDL 3 07/02/2020 0802   VLDL 27.4 07/02/2020 0802   LDLCALC 39 07/02/2020 0802   LDLDIRECT 50.0 10/22/2014 0841    LDL goal < 70  Patient has failed these meds in past: none  Patient is currently controlled on the following medications:   Atorvastatin 20 mg - 1 tablet daily (PM)  Update 09/03/20: refills timely, < 5 day gap  Plan: Continue current medications  AFIB   CBC Latest Ref Rng & Units 07/02/2020 07/03/2019 10/24/2018  WBC 4.0 - 10.5 K/uL 8.2 8.9 8.6  Hemoglobin 12.0 - 15.0 g/dL 13.7 13.0 13.0  Hematocrit 36 - 46 % 40.4 38.8 38.7  Platelets 150 - 400 K/uL 208.0 193.0 209   Symptoms: denies  Followed by Dr. Percival Spanish Patient is currently rate controlled HR: checks occasionally, no concerns reported  Patient  has failed these meds in past: none Patient is currently controlled on the following medications:   Carvedilol 12.5 mg - 1 tablet BID  Diltiazem 180 mg - 1 capsule daily   Eliquis 5 mg - 1/2 (2.5 mg) tablet BID   Update 06/17/20: Patient reports that she has 3-4 month supply of Eliquis at this time. The pharmacy told her Eliquis cost has increased to $133 from $47 likely due to coverage gap. We discussed patient assistance today but patient does not want to proceed at this time. She has 3-4 month supply at home and shouldn't need it the rest of this year. Patient met the income requirements for patient assistance but was denied in the past due to out of pocket requirements. Pharmacist encouraged patient to reach out if anything changed with needing to refill.  Update 09/03/20: Pt has adequate supply of Eliquis to finish out 2021. She is taking a 5 mg - 1/2 tablet BID. She may have collected a surplus when she was initially switched to 1/2 tablet summer 2021.  Plan: Continue current medications  Vitamin D    Last labs       VITD  Date Value Ref Range Status  01/02/2020 23.68 (L) 30.00 - 100.00 ng/mL Final     Vitamin D 02/22/18: 22.4 (low) Patient has failed these meds in past: none Patient is currently on the following medications:   Calcium 600 mg/vitamin D 20 mcg - twice daily   Vitamin D 50,000 units weekly  We discussed: last vitamin D level was low, seems pt was on supplementation at that time  Update 06/17/20 - Patient is taking Vitamin D 50,000 weekly. She has an appointment for updated labs and visit with Dr. Diona Browner in October. Patient is tolerating high dose well. We discussed benefits of supplementing with Vitamin D.   Update 09/03/20: Patient continues vitamin D weekly without any intolerances.  Plan: Continue current medications.  Anxiety/Depression   Patient has failed these meds in past: none  Patient is currently controlled on the following medications:    Citalopram 20 mg - 1 tablet daily  Update 09/03/20: Patient reports mood is stable, still doing very well on current therapy.  Plan: Continue current medications   Allergies   Patient has failed these meds in past: none Patient  is currently controlled on the following medications:   Cetirizine 10 mg - 1 tablet daily (PM)  Update 09/03/20: Patient continues Zyrtec daily without any concerns.  Plan: Continue current medications   GERD/Loose Stools  Personal history of colon cancer, resected March 2010, with resulting chronic loose stools, on cholestyramine (per Gastro note 02/2018) Patient has failed these meds in past: none  Patient is currently controlled on the following medications:   Omeprazole 20 mg - 1 capsule daily  Cholestyramine light 4 g (Prevalite) - 1 packet BID with meal 2 hours apart from other meds  Initial visit: reports cutting back on omeprazole from 2 capsules to 1 capsule daily several years ago and recently stopped taking, denies acid reflux symptoms, would like to know if okay to remain off PPI --> okay to remain off PPI, do not see compelling indication in chart, no history of GI bleed  Update 09/03/20: Patient reports taking PPI daily again to prevent indigestion. Denies recall of our discussion at previous visit about her discontinuing therapy. She also continues Prevalite BID. Denies concerns.  Plan: Continue current medications   Anemia   CBC Latest Ref Rng & Units 07/02/2020 07/03/2019 10/24/2018  WBC 4.0 - 10.5 K/uL 8.2 8.9 8.6  Hemoglobin 12.0 - 15.0 g/dL 13.7 13.0 13.0  Hematocrit 36 - 46 % 40.4 38.8 38.7  Platelets 150 - 400 K/uL 208.0 193.0 209   Iron/TIBC/Ferritin/ %Sat    Component Value Date/Time   IRON 138 07/02/2020 0802   TIBC 465 09/27/2008 2055   FERRITIN 304.9 (H) 07/02/2020 0802   IRONPCTSAT 40.9 07/02/2020 0802   Patient has failed these meds in past: none  Patient is currently controlled on the following medications:    Ferrous sulfate 325 mg EC - 1 tablet twice daily (AM)  Update 09/03/20: Patient continues iron BID without adverse effects.  Plan: Continue current medications   Osteoarthrosis   Patient has failed these meds in past: none reported Patient is currently uncontrolled on the following medications:   Voltaren gel 1% - QID PRN  Aspercreme OTC   Update 09/03/20: Patient reports voltaren gel mostly not effective. Some benefit from Asperecreme but short duration. Recommended using Tylenol Arthritis 650 mg - 1 tablet every 8 hours as needed. Schedule dosing TID if not relieved with PRN use.   Plan: Recommend trial of scheduled Tylenol.   Vaccines   Reviewed and discussed patient's vaccination history.    Immunization History  Administered Date(s) Administered  . Influenza Split 06/29/2011, 07/08/2011, 06/17/2013  . Influenza Whole 07/01/2007, 06/18/2008, 07/12/2009, 07/17/2010  . Influenza, High Dose Seasonal PF 06/25/2014, 06/10/2015, 06/06/2016, 05/26/2017, 05/28/2018, 05/21/2019, 05/16/2020  . Moderna SARS-COVID-2 Vaccination 01/25/2020, 02/22/2020  . Pneumococcal Conjugate-13 10/26/2014  . Pneumococcal Polysaccharide-23 09/29/2003, 05/20/2012  . Td 07/12/2009   Plan: Recommended patient receive Shingrix vaccine series.   Medication Management  OTCs: denies vitamins/supplements   Pharmacy/Benefits: [employer] (mails it to her or picks up depending on schedule)  Adherence: using pillbox, no adherence concerns  Affordability: denies concerns  CCM Follow Up: 6 months (telephone)  03/06/20 at 8:30 AM  [name], PharmD Clinical Pharmacist [facility] [phone]

## 2020-09-12 DIAGNOSIS — H04123 Dry eye syndrome of bilateral lacrimal glands: Secondary | ICD-10-CM | POA: Diagnosis not present

## 2020-09-12 DIAGNOSIS — H43813 Vitreous degeneration, bilateral: Secondary | ICD-10-CM | POA: Diagnosis not present

## 2020-09-12 DIAGNOSIS — E119 Type 2 diabetes mellitus without complications: Secondary | ICD-10-CM | POA: Diagnosis not present

## 2020-09-12 DIAGNOSIS — H02831 Dermatochalasis of right upper eyelid: Secondary | ICD-10-CM | POA: Diagnosis not present

## 2020-09-12 DIAGNOSIS — H40013 Open angle with borderline findings, low risk, bilateral: Secondary | ICD-10-CM | POA: Diagnosis not present

## 2020-09-12 LAB — HM DIABETES EYE EXAM

## 2020-09-16 ENCOUNTER — Encounter: Payer: Self-pay | Admitting: Family Medicine

## 2020-10-26 ENCOUNTER — Other Ambulatory Visit: Payer: Self-pay | Admitting: Family Medicine

## 2020-10-26 ENCOUNTER — Other Ambulatory Visit: Payer: Self-pay | Admitting: Cardiology

## 2020-10-26 DIAGNOSIS — I4891 Unspecified atrial fibrillation: Secondary | ICD-10-CM

## 2020-10-28 NOTE — Telephone Encounter (Signed)
Last office visit 07/11/2020 for CPE.  Last Vit D level  07/02/2020 normal at 31.41 ng/ml.  Last refilled 08/05/2020 for #12 with no refills.    Forward Eliquis refill to Dr. Percival Spanish after refilling Vit D unless you want to refill her Eliquis. On medication list it has 5 mg to take 1/2 tablet twice a day.  But it states it can't be prescribed that way that it needs to be changed to 2. 5 mg tablet.

## 2020-11-06 ENCOUNTER — Telehealth: Payer: Self-pay

## 2020-11-06 NOTE — Chronic Care Management (AMB) (Addendum)
Chronic Care Management Pharmacy Assistant   Name: Tonya [name]  MRN: [id number] DOB: 1941/04/27  Reason for Encounter: Disease State- General  Patient Question:  1.  Have you seen any other providers since your last visit? No   PCP : Tonya Sanders, MD  Allergies:   Allergies  Allergen Reactions   Amoxicillin-Pot Clavulanate Swelling and Other (See Comments)    Augmentin per patient, tongue swelling    Medications: Outpatient Encounter Medications as of 11/06/2020  Medication Sig   apixaban (ELIQUIS) 2.5 MG TABS tablet Take 1 tablet (2.5 mg total) by mouth 2 (two) times daily.   atorvastatin (LIPITOR) 20 MG tablet TAKE 1 TABLET BY MOUTH EVERY DAY   Vitamin D, Ergocalciferol, (DRISDOL) 1.25 MG (50000 UNIT) CAPS capsule TAKE 1 CAPSULE (50,000 UNITS TOTAL) BY MOUTH EVERY 7 (SEVEN) DAYS.   Blood Glucose Monitoring Suppl (ACCU-CHEK AVIVA PLUS) w/Device KIT Use to check blood once sugar daily.   carvedilol (COREG) 12.5 MG tablet TAKE 1 TABLET (12.5 MG TOTAL) BY MOUTH 2 (TWO) TIMES DAILY WITH A MEAL.   cetirizine (ZYRTEC) 10 MG tablet Take 10 mg by mouth daily as needed for allergies.   cholestyramine light (PREVALITE) 4 g packet USE 1 PACKET TWICE DAILY WITH A MEAL, AT LEAST 2 HOURS AWAY FROM OTHER MEDS   citalopram (CELEXA) 20 MG tablet TAKE 1 TABLET BY MOUTH EVERY DAY   diclofenac sodium (VOLTAREN) 1 % GEL Apply 2 g topically 4 (four) times daily.   diltiazem (CARDIZEM CD) 180 MG 24 hr capsule TAKE 1 CAPSULE BY MOUTH EVERY DAY   ferrous sulfate 325 (65 FE) MG EC tablet Take 325 mg by mouth 2 (two) times daily.   glucose blood (ACCU-CHEK AVIVA PLUS) test strip Use to check blood sugar daily.   isosorbide mononitrate (IMDUR) 30 MG 24 hr tablet TAKE 1 TABLET BY MOUTH EVERY DAY   losartan (COZAAR) 100 MG tablet TAKE 1 TABLET BY MOUTH EVERY DAY   omeprazole (PRILOSEC) 20 MG capsule TAKE 1 CAPSULE BY MOUTH EVERY DAY   No facility-administered encounter medications on file as of  11/06/2020.    Current Diagnosis: Patient Active Problem List   Diagnosis Date Noted   Left leg swelling 03/05/2020   Hypertension associated with diabetes ([place]) 01/09/2020   Lightheadedness 01/09/2020   Plantar fasciitis, left 12/13/2019   Acquired trigger finger of left ring finger 07/11/2019   Osteoarthritis of both wrists 07/11/2019   Coronary artery disease involving native coronary artery of native heart without angina pectoris 10/24/2018   Senile purpura ([place]) 03/01/2018   History of total replacement of right shoulder joint 11/06/2016   Anemia of chronic disease 11/29/2015   Spinal stenosis, lumbar region, with neurogenic claudication 11/26/2015    Class: Chronic   Mixed incontinence urge and stress 10/29/2015   CKD stage 4 due to type 2 diabetes mellitus ([place]) 06/25/2015   Counseling regarding end of life decision making 10/26/2014   S/P total knee replacement using cement 04/03/2014   Atrial fibrillation ([place]) 10/17/2010   Allergic rhinitis 01/23/2009   ADENOCARCINOMA, COLON, CECUM 10/05/2008   Coronary atherosclerosis 11/23/2007   ANXIETY 09/19/2007   Major depressive disorder, recurrent episode, moderate ([place]) 09/19/2007   GERD 09/19/2007   Controlled type 2 diabetes mellitus with diabetic nephropathy ([place]) 03/21/2007   SYNDROME, CARPAL TUNNEL 03/21/2007   Hyperlipidemia associated with type 2 diabetes mellitus ([place]) 03/21/2007   IBS 03/21/2007   Osteoarthrosis, unspecified whether generalized or localized, unspecified site 03/21/2007  Contacted Tonya [name] for general disease state call. Since last visit with Tonya [name] on 09/03/20, no interventions have been made. The patient  has not had an ED visit since last visit. The patients current Chronic and PDC medications are: Atorvastatin 20 mg daily and losartan 100 mg daily. Patient does not have greater than 5 day gap between last fill dates. The patient reports the following problems with their health states  she is having quite a bit of pain in both hands and wrists the last 2 months. She is using voltaren gel to help with pain. The patient denies  problems with her pharmacy. The patient denies side effects with her medications. she denies  concerns or questions for Tonya [name], Pharm. D at this time.     Follow-Up:  Pharmacist Review  Tonya [name], CPP notified  Tonya [name], [place] Assistant [phone]  I have reviewed the care management and care coordination activities outlined in this encounter and I am certifying that I agree with the content of this note.   At last visit recommended she try Tylenol Arthritis 650 mg - 1 tablet every 8 hours as needed. Schedule dosing TID if not relieved with PRN use. Will see if patient has tried this if not having relief with Voltaren gel.   Tonya [name], PharmD Clinical Pharmacist [name] at [facility] [phone]

## 2020-11-12 NOTE — Chronic Care Management (AMB) (Addendum)
Contacted patient about bilateral wrist and hand pain. She states that the Voltaren gel does not help. She has not tried tylenol arthritis but has taken regular strength 2 Tylenol every 8 hours and has not had any relief.   Follow-Up:  Pharmacist Review  [name], CPP notified  [name], [place] Pharmacy Assistant [phone]

## 2020-11-12 NOTE — Telephone Encounter (Signed)
Ria Comment, please see if patient's pain is improving with Voltaren gel? If not may try Tylenol Arthritis 650 mg as listed in note.

## 2020-11-13 NOTE — Telephone Encounter (Addendum)
She previously reported Aspercreme was helpful. Other options include Capsaicin cream over the counter. Apply thin layer 3-4 times daily. Transient burning may occur and generally disappears after several days. It can take 2 weeks of regular use to have an effect. If she would like to see Dr. Diona Browner, we can have a scheduler contact her. Also, let her know she can call me if she has any further questions about over the counter products.  Tonya [name], PharmD Clinical Pharmacist [facility] at [facility] [phone]

## 2020-11-14 NOTE — Chronic Care Management (AMB) (Addendum)
Called patient to inquire about her Aspercreme usage. She states she still uses it from time to time but it does not take the pain away. She will looks into the Capsaicin cream and try that. She is going to hold off on an appointment with Dr. Diona Browner for now.   Follow-Up:  Pharmacist Review  [name], CPP notified  [name], [place] Assistant [phone]  I have reviewed the care management and care coordination activities outlined in this encounter and I am certifying that I agree with the content of this note. No further action required.  [name], PharmD Clinical Pharmacist [facility] at [facility] [phone]

## 2020-11-27 ENCOUNTER — Other Ambulatory Visit: Payer: Self-pay | Admitting: Family Medicine

## 2020-12-08 ENCOUNTER — Other Ambulatory Visit: Payer: Self-pay | Admitting: Family Medicine

## 2020-12-09 NOTE — Telephone Encounter (Signed)
Last office visit 07/11/2020 for CPE.  Last refilled 10/28/2020 for #12 with no refills  Last Vit D level 07/02/2020 which was normal at 31.41 ng/ml.  Next Appt: 01/09/2021 for 6 month follow up.  Refill?

## 2020-12-19 ENCOUNTER — Telehealth: Payer: Self-pay | Admitting: Family Medicine

## 2020-12-19 DIAGNOSIS — D638 Anemia in other chronic diseases classified elsewhere: Secondary | ICD-10-CM

## 2020-12-19 DIAGNOSIS — E1121 Type 2 diabetes mellitus with diabetic nephropathy: Secondary | ICD-10-CM

## 2020-12-19 NOTE — Telephone Encounter (Signed)
-----   Message from Ellamae Sia sent at 12/16/2020 11:39 AM EDT ----- Regarding: Lab orders for Thursday, 4.7.22 Lab orders for a f/u

## 2021-01-02 ENCOUNTER — Other Ambulatory Visit (INDEPENDENT_AMBULATORY_CARE_PROVIDER_SITE_OTHER): Payer: Medicare Other

## 2021-01-02 ENCOUNTER — Other Ambulatory Visit: Payer: Self-pay

## 2021-01-02 DIAGNOSIS — E1121 Type 2 diabetes mellitus with diabetic nephropathy: Secondary | ICD-10-CM

## 2021-01-02 LAB — COMPREHENSIVE METABOLIC PANEL
ALT: 13 U/L (ref 0–35)
AST: 19 U/L (ref 0–37)
Albumin: 4.3 g/dL (ref 3.5–5.2)
Alkaline Phosphatase: 71 U/L (ref 39–117)
BUN: 23 mg/dL (ref 6–23)
CO2: 25 mEq/L (ref 19–32)
Calcium: 8.4 mg/dL (ref 8.4–10.5)
Chloride: 105 mEq/L (ref 96–112)
Creatinine, Ser: 1.54 mg/dL — ABNORMAL HIGH (ref 0.40–1.20)
GFR: 31.63 mL/min — ABNORMAL LOW (ref >60.00)
Glucose, Bld: 121 mg/dL — ABNORMAL HIGH (ref 70–99)
Potassium: 3.8 mEq/L (ref 3.5–5.1)
Sodium: 140 mEq/L (ref 135–145)
Total Bilirubin: 0.9 mg/dL (ref 0.2–1.2)
Total Protein: 7 g/dL (ref 6.0–8.3)

## 2021-01-02 LAB — LIPID PANEL
Cholesterol: 97 mg/dL (ref 0–200)
HDL: 37 mg/dL — ABNORMAL LOW (ref >39.00)
LDL Cholesterol: 30 mg/dL (ref 0–99)
NonHDL: 60.09
Total CHOL/HDL Ratio: 3
Triglycerides: 148 mg/dL (ref 0.0–149.0)
VLDL: 29.6 mg/dL (ref 0.0–40.0)

## 2021-01-02 LAB — HEMOGLOBIN A1C: Hgb A1c MFr Bld: 6.7 % — ABNORMAL HIGH (ref 4.6–6.5)

## 2021-01-02 NOTE — Progress Notes (Signed)
No critical labs need to be addressed urgently. We will discuss labs in detail at upcoming office visit.   

## 2021-01-04 ENCOUNTER — Other Ambulatory Visit: Payer: Self-pay | Admitting: Cardiology

## 2021-01-04 ENCOUNTER — Other Ambulatory Visit: Payer: Self-pay | Admitting: Family Medicine

## 2021-01-09 ENCOUNTER — Encounter: Payer: Self-pay | Admitting: Family Medicine

## 2021-01-09 ENCOUNTER — Ambulatory Visit (INDEPENDENT_AMBULATORY_CARE_PROVIDER_SITE_OTHER): Payer: Medicare Other | Admitting: Family Medicine

## 2021-01-09 ENCOUNTER — Other Ambulatory Visit: Payer: Self-pay

## 2021-01-09 VITALS — BP 140/70 | HR 104 | Temp 98.1°F | Ht 66.0 in | Wt 163.2 lb

## 2021-01-09 DIAGNOSIS — E1121 Type 2 diabetes mellitus with diabetic nephropathy: Secondary | ICD-10-CM

## 2021-01-09 DIAGNOSIS — I482 Chronic atrial fibrillation, unspecified: Secondary | ICD-10-CM | POA: Diagnosis not present

## 2021-01-09 DIAGNOSIS — E785 Hyperlipidemia, unspecified: Secondary | ICD-10-CM

## 2021-01-09 DIAGNOSIS — E1169 Type 2 diabetes mellitus with other specified complication: Secondary | ICD-10-CM

## 2021-01-09 DIAGNOSIS — E1122 Type 2 diabetes mellitus with diabetic chronic kidney disease: Secondary | ICD-10-CM

## 2021-01-09 DIAGNOSIS — N184 Chronic kidney disease, stage 4 (severe): Secondary | ICD-10-CM | POA: Diagnosis not present

## 2021-01-09 DIAGNOSIS — I152 Hypertension secondary to endocrine disorders: Secondary | ICD-10-CM

## 2021-01-09 DIAGNOSIS — E1159 Type 2 diabetes mellitus with other circulatory complications: Secondary | ICD-10-CM | POA: Diagnosis not present

## 2021-01-09 LAB — HM DIABETES FOOT EXAM

## 2021-01-09 NOTE — Assessment & Plan Note (Signed)
Stable, chronic.  Continue current medication.  Coreg 12.5 mg BID Diltiazem 180 mg daily Losartan 100 mg daily

## 2021-01-09 NOTE — Assessment & Plan Note (Signed)
Stable on ARB

## 2021-01-09 NOTE — Assessment & Plan Note (Signed)
Rate controlled and on Eliquis anticoagulation without current complications.

## 2021-01-09 NOTE — Assessment & Plan Note (Signed)
Stable, chronic.  Continue current medication.   LDL at goal on lipitor 20 mg daily.

## 2021-01-09 NOTE — Progress Notes (Signed)
Patient ID: Tonya [name], female    DOB: 01/29/1941, 80 y.o.   MRN: [id number]  This visit was conducted in person.  BP 140/70   Pulse (!) 104   Temp 98.1 F (36.7 C) (Temporal)   Ht _0  (1.676 m)   Wt 163 lb 4 oz (74 kg)   SpO2 96%   BMI 26.35 kg/m    CC:  Chief Complaint  Patient presents with  . Diabetes    Subjective:   HPI: Tonya [name] is a 80 y.o. female presenting on 01/09/2021 for Diabetes   Diabetes:  Good control with diet. Lab Results  Component Value Date   HGBA1C 6.7 (H) 01/02/2021  Using medications without difficulties: Hypoglycemic episodes: Hyperglycemic episodes: Feet problems: none Blood Sugars averaging: eye exam within last year: yes Associated with CKD: Stable.. on ARB. Baseline 1.5-1.7 Lab Results  Component Value Date   CREATININE 1.54 (H) 01/02/2021    Elevated Cholesterol:  LDL at goal on Lipitor 20 mg daily.  Heart rate and BP slightly up given just had episode of diarrhea given forgot her prevalite Lab Results  Component Value Date   CHOL 97 01/02/2021   HDL 37.00 (L) 01/02/2021   LDLCALC 30 01/02/2021   LDLDIRECT 50.0 10/22/2014   TRIG 148.0 01/02/2021   CHOLHDL 3 01/02/2021  Using medications without problems: none Muscle aches:  none Diet compliance: healthy Exercise: walking  Off and on Other complaints:  Hypertension:   BP Readings from Last 3 Encounters:  01/09/21 140/70  07/11/20 126/76  05/16/20 124/78  Using medication without problems or lightheadedness:  none Chest pain with exertion: none Edema: mild Short of breath: none Average home BPs: Other issues:      Relevant past medical, surgical, family and social history reviewed and updated as indicated. Interim medical history since our last visit reviewed. Allergies and medications reviewed and updated. Outpatient Medications Prior to Visit  Medication Sig Dispense Refill  . apixaban (ELIQUIS) 2.5 MG TABS tablet Take 1 tablet (2.5 mg total) by  mouth 2 (two) times daily. 60 tablet 11  . atorvastatin (LIPITOR) 20 MG tablet TAKE 1 TABLET BY MOUTH EVERY DAY 90 tablet 1  . Blood Glucose Monitoring Suppl (ACCU-CHEK AVIVA PLUS) w/Device KIT Use to check blood once sugar daily. 1 kit 0  . carvedilol (COREG) 12.5 MG tablet TAKE 1 TABLET (12.5 MG TOTAL) BY MOUTH 2 (TWO) TIMES DAILY WITH A MEAL. 180 tablet 3  . cetirizine (ZYRTEC) 10 MG tablet Take 10 mg by mouth daily as needed for allergies.    . cholestyramine light (PREVALITE) 4 g packet USE 1 PACKET TWICE DAILY WITH A MEAL, AT LEAST 2 HOURS AWAY FROM OTHER MEDS 540 packet 0  . citalopram (CELEXA) 20 MG tablet TAKE 1 TABLET BY MOUTH EVERY DAY 90 tablet 0  . diclofenac sodium (VOLTAREN) 1 % GEL Apply 2 g topically 4 (four) times daily. 100 g 3  . diltiazem (CARDIZEM CD) 180 MG 24 hr capsule TAKE 1 CAPSULE BY MOUTH EVERY DAY 90 capsule 3  . ferrous sulfate 325 (65 FE) MG EC tablet Take 325 mg by mouth 2 (two) times daily.    Marland Kitchen glucose blood (ACCU-CHEK AVIVA PLUS) test strip Use to check blood sugar daily. 100 each 3  . isosorbide mononitrate (IMDUR) 30 MG 24 hr tablet TAKE 1 TABLET BY MOUTH EVERY DAY 90 tablet 2  . losartan (COZAAR) 100 MG tablet TAKE 1 TABLET BY MOUTH EVERY DAY 90  tablet 3  . omeprazole (PRILOSEC) 20 MG capsule TAKE 1 CAPSULE BY MOUTH EVERY DAY 90 capsule 3  . Vitamin D, Ergocalciferol, (DRISDOL) 1.25 MG (50000 UNIT) CAPS capsule TAKE 1 CAPSULE (50,000 UNITS TOTAL) BY MOUTH EVERY 7 (SEVEN) DAYS 12 capsule 0   No facility-administered medications prior to visit.     Per HPI unless specifically indicated in ROS section below Review of Systems Objective:  BP 140/70   Pulse (!) 104   Temp 98.1 F (36.7 C) (Temporal)   Ht _0  (1.676 m)   Wt 163 lb 4 oz (74 kg)   SpO2 96%   BMI 26.35 kg/m   Wt Readings from Last 3 Encounters:  01/09/21 163 lb 4 oz (74 kg)  07/11/20 166 lb (75.3 kg)  07/02/20 164 lb (74.4 kg)      Physical Exam Constitutional:      General: She  is not in acute distress.    Appearance: Normal appearance. She is well-developed. She is not ill-appearing or toxic-appearing.  HENT:     Head: Normocephalic.     Right Ear: Hearing, tympanic membrane, ear canal and external ear normal. Tympanic membrane is not erythematous, retracted or bulging.     Left Ear: Hearing, tympanic membrane, ear canal and external ear normal. Tympanic membrane is not erythematous, retracted or bulging.     Nose: No mucosal edema or rhinorrhea.     Right Sinus: No maxillary sinus tenderness or frontal sinus tenderness.     Left Sinus: No maxillary sinus tenderness or frontal sinus tenderness.     Mouth/Throat:     Pharynx: Uvula midline.  Eyes:     General: Lids are normal. Lids are everted, no foreign bodies appreciated.     Conjunctiva/sclera: Conjunctivae normal.     Pupils: Pupils are equal, round, and reactive to light.  Neck:     Thyroid: No thyroid mass or thyromegaly.     Vascular: No carotid bruit.     Trachea: Trachea normal.  Cardiovascular:     Rate and Rhythm: Normal rate and regular rhythm.     Pulses: Normal pulses.     Heart sounds: Normal heart sounds, S1 normal and S2 normal. No murmur heard. No friction rub. No gallop.      Comments:  Left lower leg slightly swollen, non pitting.. per pt always more swollen than right Pulmonary:     Effort: Pulmonary effort is normal. No tachypnea or respiratory distress.     Breath sounds: Normal breath sounds. No decreased breath sounds, wheezing, rhonchi or rales.  Abdominal:     General: Bowel sounds are normal.     Palpations: Abdomen is soft.     Tenderness: There is no abdominal tenderness.  Musculoskeletal:     Cervical back: Normal range of motion and neck supple.     Right lower leg: No edema.     Left lower leg: 1+ Edema present.  Skin:    General: Skin is warm and dry.     Findings: No rash.  Neurological:     Mental Status: She is alert.  Psychiatric:        Mood and Affect: Mood  is not anxious or depressed.        Speech: Speech normal.        Behavior: Behavior normal. Behavior is cooperative.        Thought Content: Thought content normal.        Judgment: Judgment normal.  Diabetic foot exam: Normal inspection No skin breakdown No calluses  Normal DP pulses Normal sensation to light touch and monofilament Nails normal  Results for orders placed or performed in visit on 01/02/21  Comprehensive metabolic panel  Result Value Ref Range   Sodium 140 135 - 145 mEq/L   Potassium 3.8 3.5 - 5.1 mEq/L   Chloride 105 96 - 112 mEq/L   CO2 25 19 - 32 mEq/L   Glucose, Bld 121 (H) 70 - 99 mg/dL   BUN 23 6 - 23 mg/dL   Creatinine, Ser 1.54 (H) 0.40 - 1.20 mg/dL   Total Bilirubin 0.9 0.2 - 1.2 mg/dL   Alkaline Phosphatase 71 39 - 117 U/L   AST 19 0 - 37 U/L   ALT 13 0 - 35 U/L   Total Protein 7.0 6.0 - 8.3 g/dL   Albumin 4.3 3.5 - 5.2 g/dL   GFR 31.63 (L) >60.00 mL/min   Calcium 8.4 8.4 - 10.5 mg/dL  Lipid panel  Result Value Ref Range   Cholesterol 97 0 - 200 mg/dL   Triglycerides 148.0 0.0 - 149.0 mg/dL   HDL 37.00 (L) >39.00 mg/dL   VLDL 29.6 0.0 - 40.0 mg/dL   LDL Cholesterol 30 0 - 99 mg/dL   Total CHOL/HDL Ratio 3    NonHDL 60.09   Hemoglobin A1c  Result Value Ref Range   Hgb A1c MFr Bld 6.7 (H) 4.6 - 6.5 %    This visit occurred during the SARS-CoV-2 public health emergency.  Safety protocols were in place, including screening questions prior to the visit, additional usage of staff PPE, and extensive cleaning of exam room while observing appropriate contact time as indicated for disinfecting solutions.   COVID 19 screen:  No recent travel or known exposure to COVID19 The patient denies respiratory symptoms of COVID 19 at this time. The importance of social distancing was discussed today.   Assessment and Plan Problem List Items Addressed This Visit    Atrial fibrillation ([place])    Rate controlled and on Eliquis anticoagulation without  current complications.      CKD stage 4 due to type 2 diabetes mellitus ([place])     Stable on ARB      Controlled type 2 diabetes mellitus with diabetic nephropathy ([place]) - Primary    Good control with diet.       Hyperlipidemia associated with type 2 diabetes mellitus ([facility])    Stable, chronic.  Continue current medication.   LDL at goal on lipitor 20 mg daily.      Hypertension associated with diabetes ([facility])    Stable, chronic.  Continue current medication.  Coreg 12.5 mg BID Diltiazem 180 mg daily Losartan 100 mg daily             [name], MD

## 2021-01-09 NOTE — Patient Instructions (Signed)
Keep up the good work on trying to stay active and low carb diet.

## 2021-01-09 NOTE — Assessment & Plan Note (Signed)
Good control with diet. 

## 2021-03-01 ENCOUNTER — Other Ambulatory Visit: Payer: Self-pay | Admitting: Family Medicine

## 2021-03-02 NOTE — Telephone Encounter (Signed)
Last office visit 01/09/21 for DM.  Last refilled 12/09/20 for #12 with no refills.  Last Vit D level 07/02/20 which was normal at 31.41 ng/ml.  CPE scheduled for 07/22/21.

## 2021-03-06 ENCOUNTER — Telehealth: Payer: Medicare Other

## 2021-03-06 ENCOUNTER — Telehealth: Payer: Self-pay

## 2021-03-06 NOTE — Chronic Care Management (AMB) (Addendum)
Chronic Care Management Pharmacy Assistant   Name: DENELLE CAPURRO  MRN: [id number] DOB: 1941-06-23  Reason for Encounter: Disease State - Hypertension   Recent office visits:  01/09/21- PCP- Ordered Diabetic foot exam. No medication changes.   Recent consult visits:  None since last CCM contact  Hospital visits:  None in previous 6 months  Medications: Outpatient Encounter Medications as of 03/06/2021  Medication Sig   apixaban (ELIQUIS) 2.5 MG TABS tablet Take 1 tablet (2.5 mg total) by mouth 2 (two) times daily.   atorvastatin (LIPITOR) 20 MG tablet TAKE 1 TABLET BY MOUTH EVERY DAY   Blood Glucose Monitoring Suppl (ACCU-CHEK AVIVA PLUS) w/Device KIT Use to check blood once sugar daily.   carvedilol (COREG) 12.5 MG tablet TAKE 1 TABLET (12.5 MG TOTAL) BY MOUTH 2 (TWO) TIMES DAILY WITH A MEAL.   cetirizine (ZYRTEC) 10 MG tablet Take 10 mg by mouth daily as needed for allergies.   cholestyramine light (PREVALITE) 4 g packet USE 1 PACKET TWICE DAILY WITH A MEAL, AT LEAST 2 HOURS AWAY FROM OTHER MEDS   citalopram (CELEXA) 20 MG tablet TAKE 1 TABLET BY MOUTH EVERY DAY   diclofenac sodium (VOLTAREN) 1 % GEL Apply 2 g topically 4 (four) times daily.   diltiazem (CARDIZEM CD) 180 MG 24 hr capsule TAKE 1 CAPSULE BY MOUTH EVERY DAY   ferrous sulfate 325 (65 FE) MG EC tablet Take 325 mg by mouth 2 (two) times daily.   glucose blood (ACCU-CHEK AVIVA PLUS) test strip Use to check blood sugar daily.   isosorbide mononitrate (IMDUR) 30 MG 24 hr tablet TAKE 1 TABLET BY MOUTH EVERY DAY   losartan (COZAAR) 100 MG tablet TAKE 1 TABLET BY MOUTH EVERY DAY   omeprazole (PRILOSEC) 20 MG capsule TAKE 1 CAPSULE BY MOUTH EVERY DAY   Vitamin D, Ergocalciferol, (DRISDOL) 1.25 MG (50000 UNIT) CAPS capsule TAKE 1 CAPSULE (50,000 UNITS TOTAL) BY MOUTH EVERY 7 (SEVEN) DAYS   No facility-administered encounter medications on file as of 03/06/2021.  Reviewed chart prior to disease state call. Spoke with patient  regarding BP  Recent Office Vitals: BP Readings from Last 3 Encounters:  01/09/21 140/70  07/11/20 126/76  05/16/20 124/78   Pulse Readings from Last 3 Encounters:  01/09/21 (!) 104  07/11/20 98  05/16/20 (!) 103    Wt Readings from Last 3 Encounters:  01/09/21 163 lb 4 oz (74 kg)  07/11/20 166 lb (75.3 kg)  07/02/20 164 lb (74.4 kg)     Kidney Function Lab Results  Component Value Date/Time   CREATININE 1.54 (H) 01/02/2021 07:54 AM   CREATININE 1.77 (H) 07/02/2020 08:02 AM   CREATININE 1.42 (H) 01/11/2015 08:30 AM   CREATININE 1.30 (H) 12/26/2014 08:30 AM   GFR 31.63 (L) 01/02/2021 07:54 AM   GFRNONAA 26 (L) 11/15/2017 08:39 AM   GFRNONAA 36 (L) 01/11/2015 08:30 AM   GFRAA 30 (L) 11/15/2017 08:39 AM   GFRAA 42 (L) 01/11/2015 08:30 AM    BMP Latest Ref Rng & Units 01/02/2021 07/02/2020 01/02/2020  Glucose 70 - 99 mg/dL 121(H) 117(H) 124(H)  BUN 6 - 23 mg/dL 23 24(H) 28(H)  Creatinine 0.40 - 1.20 mg/dL 1.54(H) 1.77(H) 1.70(H)  BUN/Creat Ratio 12 - 28 - - -  Sodium 135 - 145 mEq/L 140 140 140  Potassium 3.5 - 5.1 mEq/L 3.8 4.1 4.0  Chloride 96 - 112 mEq/L 105 107 105  CO2 19 - 32 mEq/L _0 Calcium 8.4 -  10.5 mg/dL 8.4 8.6 9.1    Recent Office Vitals: BP Readings from Last 3 Encounters:  01/09/21 140/70  07/11/20 126/76  05/16/20 124/78   Pulse Readings from Last 3 Encounters:  01/09/21 (!) 104  07/11/20 98  05/16/20 (!) 103    Wt Readings from Last 3 Encounters:  01/09/21 163 lb 4 oz (74 kg)  07/11/20 166 lb (75.3 kg)  07/02/20 164 lb (74.4 kg)     Kidney Function Lab Results  Component Value Date/Time   CREATININE 1.54 (H) 01/02/2021 07:54 AM   CREATININE 1.77 (H) 07/02/2020 08:02 AM   CREATININE 1.42 (H) 01/11/2015 08:30 AM   CREATININE 1.30 (H) 12/26/2014 08:30 AM   GFR 31.63 (L) 01/02/2021 07:54 AM   GFRNONAA 26 (L) 11/15/2017 08:39 AM   GFRNONAA 36 (L) 01/11/2015 08:30 AM   GFRAA 30 (L) 11/15/2017 08:39 AM   GFRAA 42 (L) 01/11/2015 08:30 AM     BMP Latest Ref Rng & Units 01/02/2021 07/02/2020 01/02/2020  Glucose 70 - 99 mg/dL 121(H) 117(H) 124(H)  BUN 6 - 23 mg/dL 23 24(H) 28(H)  Creatinine 0.40 - 1.20 mg/dL 1.54(H) 1.77(H) 1.70(H)  BUN/Creat Ratio 12 - 28 - - -  Sodium 135 - 145 mEq/L 140 140 140  Potassium 3.5 - 5.1 mEq/L 3.8 4.1 4.0  Chloride 96 - 112 mEq/L 105 107 105  CO2 19 - 32 mEq/L _0 Calcium 8.4 - 10.5 mg/dL 8.4 8.6 9.1    Current antihypertensive regimen:  Coreg 12.5 mg 1 tablet twice daily Diltiazem 180 mg 1 tablet daily Losartan 100 mg 1 tablet daily  Patient verbally confirms she is taking the above medications as directed. Yes  How often are you checking your Blood Pressure? N/A  Current home BP readings: Patient states she does not check her blood pressure.  Wrist or arm cuff: N/A Caffeine intake: Does not drink caffeine Salt intake: Limits salt intake OTC medications including pseudoephedrine or NSAIDs? No  Any readings above 180/120? Not checking If yes any symptoms of hypertensive emergency? patient denies any symptoms of high blood pressure   What recent interventions/DTPs have been made by any provider to improve Blood Pressure control since last CPP Visit: No recent interventions.  Any recent hospitalizations or ED visits since last visit with CPP? No  What diet changes have been made to improve Blood Pressure Control?  Usually eats pop tart and decaf coffee for breakfast, eats some type of fast food for lunch burger or chicken sandwich, eats home cooked meal for dinner- varies.   What exercise is being done to improve your Blood Pressure Control?  States she is not very active. She lives with her daughter and will sometimes help with their dogs if needed, pick up children for school if needed and she visits her sister in [place] every chance she gets.   Adherence Review: Is the patient currently on ACE/ARB medication? Yes Does the patient have >5 day gap between last estimated  fill dates? No   Star Rating Drugs:  Medication:  Last Fill: Day Supply Atorvastatin 20 mg 01/24/21 90 Losartan 100 mg 01/23/21 90   No appointments scheduled within the next 30 days.  Care Gaps: Last AWV: 07/02/20 Diabetic foot exam: 01/09/21 Eye exam / Retinopathy Screening: December 2021.    Follow-Up:  Pharmacist Review  [name], CPP notified  [name], [place] Assistant [phone]  I have reviewed the care management and care coordination activities outlined in this encounter  and I am certifying that I agree with the content of this note. No further action required.  [name], PharmD Clinical Pharmacist [facility] at [facility] [phone]

## 2021-03-10 ENCOUNTER — Telehealth: Payer: Self-pay

## 2021-03-10 DIAGNOSIS — M25512 Pain in left shoulder: Secondary | ICD-10-CM | POA: Diagnosis not present

## 2021-03-10 DIAGNOSIS — M25522 Pain in left elbow: Secondary | ICD-10-CM | POA: Diagnosis not present

## 2021-03-10 NOTE — Telephone Encounter (Signed)
   [facility] Pre-operative Risk Assessment    Patient Name: Tonya [name]  DOB: 1941/02/12  MRN: [id number]   Request for surgical clearance:  What type of surgery is being performed? ORIF LEFT OLECRANON   When is this surgery scheduled? 03-18-2021   What type of clearance is required (medical clearance vs. Pharmacy clearance to hold med vs. Both)? BOTH  Are there any medications that need to be held prior to surgery and how long?Boyd name and name of physician performing surgery? MURPHY [place] ORTHO DR TIMOTHY MURPHY  ATTNClaiborne Billings   What is the office phone number? [phone]   7.   What is the office fax number? [phone]  8.   Anesthesia type (None, local, MAC, general) ? NOT LISTED

## 2021-03-10 NOTE — H&P (Signed)
PREOPERATIVE H&P  Chief Complaint: LEFT ELBOW FRACTURE  HPI: Tonya [name] is a 80 y.o. female who presents with a diagnosis of LEFT ELBOW FRACTURE. She was at home and tripped over her dog causing her to fall forward to the ground. She had immediate pain in the left elbow and shoulder. She was bleeding from a laceration to her forehead and left elbow. She is unable to move the left elbow without pain. Symptoms are rated as moderate to severe, and have been worsening.  This is significantly impairing activities of daily living.  She has elected for surgical management.   Past Medical History:  Diagnosis Date   Adenocarcinoma, colon ([place])    Anemia    iron deficiency   Anxiety    Carpal tunnel syndrome    Cataract    REMOVED BILATERAL   Chronic diarrhea    Colon polyps    Tubular adenoma   Degenerative disk disease    Depression    Diabetes mellitus, type 2 ([place])    Dysrhythmia    h/o AFIB   GERD (gastroesophageal reflux disease)    Hyperlipidemia    Hypertension    IBS (irritable bowel syndrome)    Osteoarthritis    Osteoporosis    S/P coronary artery stent placement 2007   Past Surgical History:  Procedure Laterality Date   CARDIAC CATHETERIZATION  12/10   LAD stent patent. Insignificant CAD, otherwise EF 60-65%   CARPAL TUNNEL RELEASE     CATARACT EXTRACTION  03/2015   CHOLECYSTECTOMY     COLON SURGERY     COLONOSCOPY     DILATION AND CURETTAGE OF UTERUS     HEMATOMA EVACUATION     after knee replacement   KNEE ARTHROSCOPY     bilateral   LUMBAR LAMINECTOMY N/A 11/26/2015   Procedure: LUMBAR DECOMPRESSIVE LAMINECTOMY L3-4 AND L4-5, LEFT L2-3 HEMILAMINECTOMY;  Surgeon: Jessy Oto, MD;  Location: [facility];  Service: Orthopedics;  Laterality: N/A;   PARTIAL COLECTOMY  11/2008   right, for adenocarcinoma   REPLACEMENT TOTAL KNEE  03/3709   right, complicated by hemarthrosis    REVERSE SHOULDER ARTHROPLASTY Right 05/28/2016   REVERSE SHOULDER ARTHROPLASTY Right  05/28/2016   Procedure: REVERSE SHOULDER ARTHROPLASTY;  Surgeon: Meredith Pel, MD;  Location: [facility];  Service: Orthopedics;  Laterality: Right;   TONSILLECTOMY     TOTAL KNEE ARTHROPLASTY Left 04/03/2014   dr whitfield   TOTAL KNEE ARTHROPLASTY Left 04/03/2014   Procedure: TOTAL KNEE ARTHROPLASTY;  Surgeon: Garald Balding, MD;  Location: [facility];  Service: Orthopedics;  Laterality: Left;   TOTAL KNEE REVISION  03/08/2012   Procedure: TOTAL KNEE REVISION;  Surgeon: Garald Balding, MD;  Location: [facility];  Service: Orthopedics;  Laterality: Right;  removal total knee hardware and placement of antibiotic cement spacer and antibiotic beads   TOTAL KNEE REVISION  05/17/2012   Procedure: TOTAL KNEE REVISION;  Surgeon: Garald Balding, MD;  Location: [facility];  Service: Orthopedics;  Laterality: Right;  right total knee revision, removal of antibiotic spacer   Social History   Socioeconomic History   Marital status: Divorced    Spouse name: Not on file   Number of children: 3   Years of education: Not on file   Highest education level: Not on file  Occupational History   Occupation: retired    Fish farm manager: RETIRED  Tobacco Use   Smoking status: Never   Smokeless tobacco: Never  Vaping Use   Vaping  Use: Never used  Substance and Sexual Activity   Alcohol use: No    Alcohol/week: 0.0 standard drinks   Drug use: No   Sexual activity: Never  Other Topics Concern   Not on file  Social History Narrative   No regular exercise, limited due to knees   Diet- addicted to sweets, some fruit and veggies, water daily.   Social Determinants of Health   Financial Resource Strain: Low Risk    Difficulty of Paying Living Expenses: Not hard at all  Food Insecurity: No Food Insecurity   Worried About Charity fundraiser in the Last Year: Never true   [place] in the Last Year: Never true  Transportation Needs: No Transportation Needs   Lack of Transportation (Medical): No   Lack of  Transportation (Non-Medical): No  Physical Activity: Inactive   Days of Exercise per Week: 0 days   Minutes of Exercise per Session: 0 min  Stress: No Stress Concern Present   Feeling of Stress : Not at all  Social Connections: Not on file   Family History  Problem Relation Age of Onset   Heart disease Mother        massive MI age 73   Heart attack Mother    Heart disease Father        Massive MI age 97   Prostate cancer Father    Diabetes Paternal Grandmother    Colon polyps Sister    Colon cancer Neg Hx    Allergies  Allergen Reactions   Amoxicillin-Pot Clavulanate Swelling and Other (See Comments)    Augmentin per patient, tongue swelling   Prior to Admission medications   Medication Sig Start Date End Date Taking? Authorizing Provider  apixaban (ELIQUIS) 2.5 MG TABS tablet Take 1 tablet (2.5 mg total) by mouth 2 (two) times daily. 10/28/20   Bedsole, Amy E, MD  atorvastatin (LIPITOR) 20 MG tablet TAKE 1 TABLET BY MOUTH EVERY DAY 10/28/20   Minus Breeding, MD  Blood Glucose Monitoring Suppl (ACCU-CHEK AVIVA PLUS) w/Device KIT Use to check blood once sugar daily. 03/14/18   Bedsole, Amy E, MD  carvedilol (COREG) 12.5 MG tablet TAKE 1 TABLET (12.5 MG TOTAL) BY MOUTH 2 (TWO) TIMES DAILY WITH A MEAL. 07/24/20   Bedsole, Amy E, MD  cetirizine (ZYRTEC) 10 MG tablet Take 10 mg by mouth daily as needed for allergies.    [provider]  cholestyramine light (PREVALITE) 4 g packet USE 1 PACKET TWICE DAILY WITH A MEAL, AT LEAST 2 HOURS AWAY FROM OTHER MEDS 08/09/20   Milus Banister, MD  citalopram (CELEXA) 20 MG tablet TAKE 1 TABLET BY MOUTH EVERY DAY 03/02/21   Bedsole, Amy E, MD  diclofenac sodium (VOLTAREN) 1 % GEL Apply 2 g topically 4 (four) times daily. 07/11/19   Bedsole, Amy E, MD  diltiazem (CARDIZEM CD) 180 MG 24 hr capsule TAKE 1 CAPSULE BY MOUTH EVERY DAY 08/05/20   Bedsole, Amy E, MD  ferrous sulfate 325 (65 FE) MG EC tablet Take 325 mg by mouth 2 (two) times daily.     Bedsole, Amy E, MD  glucose blood (ACCU-CHEK AVIVA PLUS) test strip Use to check blood sugar daily. 03/14/18   Bedsole, Amy E, MD  isosorbide mononitrate (IMDUR) 30 MG 24 hr tablet TAKE 1 TABLET BY MOUTH EVERY DAY 01/06/21   Minus Breeding, MD  losartan (COZAAR) 100 MG tablet TAKE 1 TABLET BY MOUTH EVERY DAY 05/02/20   Minus Breeding, MD  omeprazole (PRILOSEC) 20 MG capsule TAKE 1 CAPSULE BY MOUTH EVERY DAY 10/28/20   Bedsole, Amy E, MD  Vitamin D, Ergocalciferol, (DRISDOL) 1.25 MG (50000 UNIT) CAPS capsule TAKE 1 CAPSULE (50,000 UNITS TOTAL) BY MOUTH EVERY 7 (SEVEN) DAYS 03/03/21   Bedsole, Amy E, MD     Positive ROS: All other systems have been reviewed and were otherwise negative with the exception of those mentioned in the HPI and as above.  Physical Exam: General: Alert, no acute distress Cardiovascular: No pedal edema Respiratory: No cyanosis, no use of accessory musculature GI: No organomegaly, abdomen is soft and non-tender Skin: 78m vertical laceration on forehead between the eyebrows that is bleeding, 2 skin tears on the posterior aspect of the left elbow Neurologic: Sensation intact distally Psychiatric: Patient is competent for consent with normal mood and affect Lymphatic: No axillary or cervical lymphadenopathy  MUSCULOSKELETAL: very TTP left anterior shoulder and left elbow, very limited ROM at both sites, + cuff tests, severe effusion left elbow, ROM left wrist and hand intact, NVI  Assessment: LEFT ELBOW FRACTURE  Awaiting results from Left shoulder CT scan to determine plan of action for the shoulder pain. No obvious fracture or dislocation seen on office xrays.   Plan: Plan for Procedure(s): OPEN REDUCTION INTERNAL FIXATION (ORIF) ELBOW/OLECRANON FRACTURE  The risks benefits and alternatives were discussed with the patient including but not limited to the risks of nonoperative treatment, versus surgical intervention including infection, bleeding, nerve injury,  blood  clots, cardiopulmonary complications, morbidity, mortality, among others, and they were willing to proceed.   Weightbearing: NWB LUE Orthopedic devices: splint Showering: POD 3, keep splint dry Dressing: reinforce as needed Medicines: on Eliquis, Norco 10, Tylenol, Gabapentin, Robaxin, Zofran   Discharge: home Follow up: 2 weeks    MAlisa GraffOffice [phone]/13/2022 3:22 PM

## 2021-03-10 NOTE — Telephone Encounter (Signed)
Patient with diagnosis of afib on Eliquis for anticoagulation.    Procedure: ORIF LEFT OLECRANON  Date of procedure: 03/18/21  CHA2DS2-VASc Score = 6  This indicates a 9.7% annual risk of stroke. The patient's score is based upon: CHF History: No HTN History: Yes Diabetes History: Yes Stroke History: No Vascular Disease History: Yes Age Score: 2 Gender Score: 1    CrCl 27.2 ml/min Platelet count 208  Per office protocol, patient can hold Eliquis for 3 days prior to procedure.

## 2021-03-11 NOTE — Telephone Encounter (Signed)
    Tonya [name] DOB:  1941/06/20  MRN:  [id number]   Primary Cardiologist: Dr. Percival Spanish, MD   Chart reviewed as part of pre-operative protocol coverage. Given past medical history and time since last visit, based on ACC/AHA guidelines, Tonya [name] would be at acceptable risk for the planned procedure without further cardiovascular testing.   Patient with diagnosis of afib on Eliquis for anticoagulation.     Procedure: ORIF LEFT OLECRANON  Date of procedure: 03/18/21   CHA2DS2-VASc Score = 6  This indicates a 9.7% annual risk of stroke. The patient's score is based upon: CHF History: No HTN History: Yes Diabetes History: Yes Stroke History: No Vascular Disease History: Yes Age Score: 2 Gender Score: 1    CrCl 27.2 ml/min Platelet count 208   Per office protocol, patient can hold Eliquis for 3 days prior to procedure.     The patient was advised that if she develops new symptoms prior to surgery to contact our office to arrange for a follow-up visit, and she verbalized understanding.  I will route this recommendation to the requesting party via Epic fax function and remove from pre-op pool.  Please call with questions.  Kathyrn Drown, NP 03/11/2021, 9:12 AM

## 2021-03-11 NOTE — Telephone Encounter (Signed)
Patient was returning call 

## 2021-03-11 NOTE — Telephone Encounter (Signed)
    ATIYAH BAUER DOB:  28-Nov-1940  MRN:  [id number]   Primary Cardiologist: Dr. Percival Spanish, MD   Chart reviewed as part of pre-operative protocol coverage. Given past medical history and time since last visit, based on ACC/AHA guidelines, JAYEL SCADUTO would be at acceptable risk for the planned procedure without further cardiovascular testing.   The patient was advised that if she develops new symptoms prior to surgery to contact our office to arrange for a follow-up visit, and she verbalized understanding.  I will route this recommendation to the requesting party via Epic fax function and remove from pre-op pool.  Please call with questions.  Kathyrn Drown, NP 03/11/2021, 9:10 AM

## 2021-03-13 NOTE — Progress Notes (Signed)
COVID Vaccine completed:  Yes x4 Date Completed:  01-25-20, 02-22-20 Has received booster: 09-08-20, 02-14-21 COVID vaccine manufacturer: Pfizer    Moderna     Date of COVID positive in last 90 days:  PCP - [name], MD Cardiologist - [place]  Cardiac clearance in chart dated 03-11-21 by Kathyrn Drown, NP  Chest x-ray -  EKG - 04-28-20 Epic Stress Test - 2013 Epic ECHO - 09-22-18 Epic Cardiac Cath -  Pacemaker/ICD device last checked: Spinal Cord Stimulator: Holter Monitor - 2019 Epic  Sleep Study -  CPAP -   Fasting Blood Sugar -  Checks Blood Sugar _____ times a day  Blood Thinner Instructions:  Eliquis 2.5 mg (ok to hold for 3 days per clearance) Aspirin Instructions: Last Dose:  Activity level:  Can go up a flight of stairs and perform activities of daily living without stopping and without symptoms of chest pain or shortness of breath.   Able to exercise without symptoms  Unable to go up a flight of stairs without symptoms of      Anesthesia review: Afib, CAD, HTN, DM.  Hx of stent placement  Patient denies shortness of breath, fever, cough and chest pain at PAT appointment   Patient verbalized understanding of instructions that were given to them at the PAT appointment. Patient was also instructed that they will need to review over the PAT instructions again at home before surgery.

## 2021-03-14 DIAGNOSIS — M25512 Pain in left shoulder: Secondary | ICD-10-CM | POA: Diagnosis not present

## 2021-03-17 ENCOUNTER — Other Ambulatory Visit: Payer: Self-pay

## 2021-03-17 ENCOUNTER — Encounter (HOSPITAL_COMMUNITY): Payer: Self-pay | Admitting: Orthopedic Surgery

## 2021-03-17 NOTE — Progress Notes (Signed)
COVID Vaccine completed:  Yes x4 Date Completed:  01-25-20, 02-22-20 Has received booster: 09-08-20, 02-14-21 COVID vaccine manufacturer:    Moderna      Date of COVID positive in last 90 days: No   PCP - [name], MD Cardiologist - Dr. Percival Spanish   Cardiac clearance in chart dated 03-11-21 by Kathyrn Drown, NP   Chest x-ray -N/A EKG - 04-28-20 Epic Stress Test - 2013 Epic ECHO - 09-22-18 Epic Cardiac Cath -N/A Pacemaker/ICD device last checked:N/A Spinal Cord Stimulator:N/A Holter Monitor - 2019 Epic   Sleep Study -N/A CPAP -N/A   Fasting Blood Sugar -N/A Checks Blood Sugar ___rare__ times a day   Blood Thinner Instructions:  Eliquis 2.5 mg (ok to hold for 3 days per clearance) Aspirin Instructions: N/A Last Dose:N/A   Activity level:   Can go up a flight of stairs and perform activities of daily living without stopping and without symptoms of chest pain or shortness of breath.                                                  Anesthesia review: Afib, CAD, HTN, DM.  Hx of stent placement   Patient denies shortness of breath, fever, cough and chest pain at PAT appointment     Patient verbalized understanding of instructions that were given to them at the PAT appointment. Patient was also instructed that they will need to review over the PAT instructions again at home before surgery.

## 2021-03-18 ENCOUNTER — Encounter (HOSPITAL_COMMUNITY): Payer: Self-pay | Admitting: Orthopedic Surgery

## 2021-03-18 ENCOUNTER — Encounter (HOSPITAL_COMMUNITY)
Admission: RE | Disposition: A | Discharge status: Home / Self Care | Payer: Self-pay | Source: Home / Self Care | Attending: Orthopedic Surgery

## 2021-03-18 ENCOUNTER — Ambulatory Visit (HOSPITAL_COMMUNITY): Payer: Medicare Other | Admitting: Physician Assistant

## 2021-03-18 ENCOUNTER — Ambulatory Visit (HOSPITAL_COMMUNITY)
Admission: RE | Admit: 2021-03-18 | Discharge: 2021-03-18 | Disposition: A | Discharge status: Home / Self Care | Payer: Medicare Other | Attending: Orthopedic Surgery | Admitting: Orthopedic Surgery

## 2021-03-18 DIAGNOSIS — K219 Gastro-esophageal reflux disease without esophagitis: Secondary | ICD-10-CM | POA: Diagnosis not present

## 2021-03-18 DIAGNOSIS — Z96653 Presence of artificial knee joint, bilateral: Secondary | ICD-10-CM | POA: Insufficient documentation

## 2021-03-18 DIAGNOSIS — Z880 Allergy status to penicillin: Secondary | ICD-10-CM | POA: Insufficient documentation

## 2021-03-18 DIAGNOSIS — W010XXA Fall on same level from slipping, tripping and stumbling without subsequent striking against object, initial encounter: Secondary | ICD-10-CM | POA: Diagnosis not present

## 2021-03-18 DIAGNOSIS — Y92019 Unspecified place in single-family (private) house as the place of occurrence of the external cause: Secondary | ICD-10-CM | POA: Insufficient documentation

## 2021-03-18 DIAGNOSIS — Z79899 Other long term (current) drug therapy: Secondary | ICD-10-CM | POA: Diagnosis not present

## 2021-03-18 DIAGNOSIS — I4891 Unspecified atrial fibrillation: Secondary | ICD-10-CM | POA: Diagnosis not present

## 2021-03-18 DIAGNOSIS — Z9049 Acquired absence of other specified parts of digestive tract: Secondary | ICD-10-CM | POA: Diagnosis not present

## 2021-03-18 DIAGNOSIS — Z7901 Long term (current) use of anticoagulants: Secondary | ICD-10-CM | POA: Insufficient documentation

## 2021-03-18 DIAGNOSIS — Z96611 Presence of right artificial shoulder joint: Secondary | ICD-10-CM | POA: Diagnosis not present

## 2021-03-18 DIAGNOSIS — S52022A Displaced fracture of olecranon process without intraarticular extension of left ulna, initial encounter for closed fracture: Secondary | ICD-10-CM | POA: Insufficient documentation

## 2021-03-18 DIAGNOSIS — S42135A Nondisplaced fracture of coracoid process, left shoulder, initial encounter for closed fracture: Secondary | ICD-10-CM | POA: Diagnosis not present

## 2021-03-18 DIAGNOSIS — G8918 Other acute postprocedural pain: Secondary | ICD-10-CM | POA: Diagnosis not present

## 2021-03-18 HISTORY — DX: Disorder of kidney and ureter, unspecified: N28.9

## 2021-03-18 HISTORY — DX: Other specified postprocedural states: R11.2

## 2021-03-18 HISTORY — PX: ORIF ELBOW FRACTURE: SHX5031

## 2021-03-18 HISTORY — DX: Other specified postprocedural states: Z98.890

## 2021-03-18 HISTORY — DX: Personal history of other medical treatment: Z92.89

## 2021-03-18 LAB — CBC
HCT: 37.1 % (ref 36.0–46.0)
Hemoglobin: 12.5 g/dL (ref 12.0–15.0)
MCH: 31.3 pg (ref 26.0–34.0)
MCHC: 33.7 g/dL (ref 30.0–36.0)
MCV: 92.8 fL (ref 80.0–100.0)
Platelets: 233 10*3/uL (ref 150–400)
RBC: 4 MIL/uL (ref 3.87–5.11)
RDW: 12.7 % (ref 11.5–15.5)
WBC: 10.8 10*3/uL — ABNORMAL HIGH (ref 4.0–10.5)
nRBC: 0 % (ref 0.0–0.2)

## 2021-03-18 LAB — BASIC METABOLIC PANEL
Anion gap: 10 (ref 5–15)
BUN: 32 mg/dL — ABNORMAL HIGH (ref 8–23)
CO2: 19 mmol/L — ABNORMAL LOW (ref 22–32)
Calcium: 8.5 mg/dL — ABNORMAL LOW (ref 8.9–10.3)
Chloride: 107 mmol/L (ref 98–111)
Creatinine, Ser: 1.54 mg/dL — ABNORMAL HIGH (ref 0.44–1.00)
GFR, Estimated: 34 mL/min — ABNORMAL LOW (ref >60)
Glucose, Bld: 154 mg/dL — ABNORMAL HIGH (ref 70–99)
Potassium: 5 mmol/L (ref 3.5–5.1)
Sodium: 136 mmol/L (ref 135–145)

## 2021-03-18 LAB — GLUCOSE, CAPILLARY: Glucose-Capillary: 141 mg/dL — ABNORMAL HIGH (ref 70–99)

## 2021-03-18 LAB — SURGICAL PCR SCREEN
MRSA, PCR: NEGATIVE
Staphylococcus aureus: NEGATIVE

## 2021-03-18 SURGERY — OPEN REDUCTION INTERNAL FIXATION (ORIF) ELBOW/OLECRANON FRACTURE
Anesthesia: General | Site: Elbow | Laterality: Left

## 2021-03-18 MED ORDER — LIDOCAINE 2% (20 MG/ML) 5 ML SYRINGE
INTRAMUSCULAR | Status: AC
  Filled 2021-03-18: qty 5

## 2021-03-18 MED ORDER — FENTANYL CITRATE (PF) 100 MCG/2ML IJ SOLN
50.0000 ug | Freq: Once | INTRAMUSCULAR | Status: AC
  Administered 2021-03-18: 50 ug via INTRAVENOUS
  Filled 2021-03-18: qty 2

## 2021-03-18 MED ORDER — DEXAMETHASONE SODIUM PHOSPHATE 10 MG/ML IJ SOLN
INTRAMUSCULAR | Status: AC
  Filled 2021-03-18: qty 1

## 2021-03-18 MED ORDER — LIDOCAINE 2% (20 MG/ML) 5 ML SYRINGE
INTRAMUSCULAR | Status: DC | PRN
  Administered 2021-03-18: 60 mg via INTRAVENOUS

## 2021-03-18 MED ORDER — ACETAMINOPHEN 500 MG PO TABS
1000.0000 mg | ORAL_TABLET | Freq: Once | ORAL | Status: AC
  Administered 2021-03-18: 1000 mg via ORAL
  Filled 2021-03-18: qty 2

## 2021-03-18 MED ORDER — SUGAMMADEX SODIUM 200 MG/2ML IV SOLN
INTRAVENOUS | Status: DC | PRN
  Administered 2021-03-18: 200 mg via INTRAVENOUS

## 2021-03-18 MED ORDER — ROCURONIUM BROMIDE 10 MG/ML (PF) SYRINGE
PREFILLED_SYRINGE | INTRAVENOUS | Status: DC | PRN
  Administered 2021-03-18: 60 mg via INTRAVENOUS

## 2021-03-18 MED ORDER — BUPIVACAINE-EPINEPHRINE (PF) 0.5% -1:200000 IJ SOLN
INTRAMUSCULAR | Status: DC | PRN
  Administered 2021-03-18: 30 mL via PERINEURAL

## 2021-03-18 MED ORDER — ONDANSETRON HCL 4 MG/2ML IJ SOLN: 4.0000 mg | Freq: Once | INTRAMUSCULAR | Status: DC | PRN

## 2021-03-18 MED ORDER — ORAL CARE MOUTH RINSE: 15.0000 mL | Freq: Once | OROMUCOSAL | Status: AC

## 2021-03-18 MED ORDER — APREPITANT 40 MG PO CAPS
40.0000 mg | ORAL_CAPSULE | Freq: Once | ORAL | Status: AC
  Administered 2021-03-18: 40 mg via ORAL
  Filled 2021-03-18 (×2): qty 1

## 2021-03-18 MED ORDER — ONDANSETRON HCL 4 MG/2ML IJ SOLN
INTRAMUSCULAR | Status: DC | PRN
  Administered 2021-03-18: 4 mg via INTRAVENOUS

## 2021-03-18 MED ORDER — POVIDONE-IODINE 10 % EX SWAB
2.0000 "application " | Freq: Once | CUTANEOUS | Status: AC
  Administered 2021-03-18: 2 via TOPICAL

## 2021-03-18 MED ORDER — FENTANYL CITRATE (PF) 100 MCG/2ML IJ SOLN
INTRAMUSCULAR | Status: DC | PRN
  Administered 2021-03-18: 50 ug via INTRAVENOUS

## 2021-03-18 MED ORDER — FENTANYL CITRATE (PF) 100 MCG/2ML IJ SOLN: 25.0000 ug | INTRAMUSCULAR | Status: DC | PRN

## 2021-03-18 MED ORDER — PROPOFOL 10 MG/ML IV BOLUS
INTRAVENOUS | Status: DC | PRN
  Administered 2021-03-18: 130 mg via INTRAVENOUS
  Administered 2021-03-18: 100 ug/kg/min via INTRAVENOUS

## 2021-03-18 MED ORDER — FENTANYL CITRATE (PF) 100 MCG/2ML IJ SOLN
INTRAMUSCULAR | Status: AC
  Filled 2021-03-18: qty 2

## 2021-03-18 MED ORDER — GABAPENTIN 100 MG PO CAPS: 100.0000 mg | ORAL_CAPSULE | Freq: Three times a day (TID) | ORAL | 0 refills | Status: AC

## 2021-03-18 MED ORDER — TRANEXAMIC ACID-NACL 1000-0.7 MG/100ML-% IV SOLN
1000.0000 mg | INTRAVENOUS | Status: AC
  Administered 2021-03-18: 1000 mg via INTRAVENOUS
  Filled 2021-03-18: qty 100

## 2021-03-18 MED ORDER — OXYCODONE HCL 5 MG PO TABS: ORAL_TABLET | ORAL | 0 refills | Status: AC

## 2021-03-18 MED ORDER — VANCOMYCIN HCL IN DEXTROSE 1-5 GM/200ML-% IV SOLN
1000.0000 mg | INTRAVENOUS | Status: AC
  Administered 2021-03-18: 1000 mg via INTRAVENOUS
  Filled 2021-03-18: qty 200

## 2021-03-18 MED ORDER — PHENYLEPHRINE 40 MCG/ML (10ML) SYRINGE FOR IV PUSH (FOR BLOOD PRESSURE SUPPORT)
PREFILLED_SYRINGE | INTRAVENOUS | Status: DC | PRN
  Administered 2021-03-18 (×2): 80 ug via INTRAVENOUS

## 2021-03-18 MED ORDER — DEXAMETHASONE SODIUM PHOSPHATE 10 MG/ML IJ SOLN: 8.0000 mg | Freq: Once | INTRAMUSCULAR | Status: DC

## 2021-03-18 MED ORDER — ACETAMINOPHEN 500 MG PO TABS: 1000.0000 mg | ORAL_TABLET | Freq: Three times a day (TID) | ORAL | 0 refills | Status: AC

## 2021-03-18 MED ORDER — DEXAMETHASONE SODIUM PHOSPHATE 10 MG/ML IJ SOLN
INTRAMUSCULAR | Status: DC | PRN
  Administered 2021-03-18: 10 mg via INTRAVENOUS

## 2021-03-18 MED ORDER — ONDANSETRON HCL 4 MG PO TABS: 4.0000 mg | ORAL_TABLET | Freq: Three times a day (TID) | ORAL | 0 refills | Status: AC | PRN

## 2021-03-18 MED ORDER — LACTATED RINGERS IV SOLN: INTRAVENOUS | Status: DC

## 2021-03-18 MED ORDER — ONDANSETRON HCL 4 MG/2ML IJ SOLN
INTRAMUSCULAR | Status: AC
  Filled 2021-03-18: qty 2

## 2021-03-18 MED ORDER — SODIUM CHLORIDE 0.9 % IR SOLN
Status: DC | PRN
  Administered 2021-03-18: 1000 mL

## 2021-03-18 MED ORDER — PROPOFOL 10 MG/ML IV BOLUS
INTRAVENOUS | Status: AC
  Filled 2021-03-18: qty 20

## 2021-03-18 MED ORDER — OXYCODONE HCL 5 MG/5ML PO SOLN: 5.0000 mg | Freq: Once | ORAL | Status: DC | PRN

## 2021-03-18 MED ORDER — OXYCODONE HCL 5 MG PO TABS: 5.0000 mg | ORAL_TABLET | Freq: Once | ORAL | Status: DC | PRN

## 2021-03-18 MED ORDER — CHLORHEXIDINE GLUCONATE 0.12 % MT SOLN
15.0000 mL | Freq: Once | OROMUCOSAL | Status: AC
  Administered 2021-03-18: 15 mL via OROMUCOSAL

## 2021-03-18 SURGICAL SUPPLY — 72 items
APL PRP STRL LF DISP 70% ISPRP (MISCELLANEOUS) ×2
BLADE SURG 15 STRL LF DISP TIS (BLADE) ×1 IMPLANT
BLADE SURG 15 STRL SS (BLADE) ×2
BNDG CMPR 9X4 STRL LF SNTH (GAUZE/BANDAGES/DRESSINGS) ×1
BNDG COHESIVE 4X5 TAN STRL (GAUZE/BANDAGES/DRESSINGS) ×2 IMPLANT
BNDG ELASTIC 4X5.8 VLCR STR LF (GAUZE/BANDAGES/DRESSINGS) ×4 IMPLANT
BNDG ESMARK 4X9 LF (GAUZE/BANDAGES/DRESSINGS) ×1 IMPLANT
BNDG PLASTER X FAST 5X5 WHT LF (CAST SUPPLIES) ×1 IMPLANT
BNDG PLSTR 5X5 XFST ST WHT LF (CAST SUPPLIES) ×1
CANISTER SUCT 3000ML PPV (MISCELLANEOUS) ×2 IMPLANT
CHLORAPREP W/TINT 26 (MISCELLANEOUS) ×3 IMPLANT
CLSR STERI-STRIP ANTIMIC 1/2X4 (GAUZE/BANDAGES/DRESSINGS) IMPLANT
COVER BACK TABLE 60X90IN (DRAPES) ×2 IMPLANT
COVER WAND RF STERILE (DRAPES) ×2 IMPLANT
CUFF TOURN SGL QUICK 18X4 (TOURNIQUET CUFF) ×1 IMPLANT
CUFF TOURN SGL QUICK 24 (TOURNIQUET CUFF) ×2
CUFF TRNQT CYL 24X4X16.5-23 (TOURNIQUET CUFF) IMPLANT
DECANTER SPIKE VIAL GLASS SM (MISCELLANEOUS) IMPLANT
DRAPE EXTREMITY T 121X128X90 (DISPOSABLE) ×2 IMPLANT
DRAPE IMP U-DRAPE 54X76 (DRAPES) ×3 IMPLANT
DRAPE OEC MINIVIEW 54X84 (DRAPES) ×2 IMPLANT
DRAPE TOP 10253 STERILE (DRAPES) ×1 IMPLANT
DRAPE U-SHAPE 47X51 STRL (DRAPES) ×2 IMPLANT
DRSG PAD ABDOMINAL 8X10 ST (GAUZE/BANDAGES/DRESSINGS) ×1 IMPLANT
ELECT REM PT RETURN 15FT ADLT (MISCELLANEOUS) ×2 IMPLANT
GAUZE 4X4 16PLY RFD (DISPOSABLE) ×1 IMPLANT
GAUZE SPONGE 4X4 12PLY STRL (GAUZE/BANDAGES/DRESSINGS) ×2 IMPLANT
GAUZE XEROFORM 1X8 LF (GAUZE/BANDAGES/DRESSINGS) ×1 IMPLANT
GLOVE SRG 8 PF TXTR STRL LF DI (GLOVE) ×1 IMPLANT
GLOVE SURG ENC MOIS LTX SZ7.5 (GLOVE) ×2 IMPLANT
GLOVE SURG UNDER POLY LF SZ8 (GLOVE) ×2
GOWN STRL REUS W/TWL XL LVL3 (GOWN DISPOSABLE) ×2 IMPLANT
K-WIRE SURGICAL 1.6X102 (WIRE) ×1 IMPLANT
KIT BASIN OR (CUSTOM PROCEDURE TRAY) ×2 IMPLANT
KIT TURNOVER KIT A (KITS) ×2 IMPLANT
NDL HYPO 25X1 1.5 SAFETY (NEEDLE) IMPLANT
NDL MAYO 6 CRC TAPER PT (NEEDLE) IMPLANT
NDL MAYO CATGUT SZ4 TPR NDL (NEEDLE) IMPLANT
NEEDLE HYPO 25X1 1.5 SAFETY (NEEDLE) IMPLANT
NEEDLE MAYO 6 CRC TAPER PT (NEEDLE) IMPLANT
NEEDLE MAYO CATGUT SZ4 (NEEDLE) IMPLANT
NS IRRIG 1000ML POUR BTL (IV SOLUTION) ×2 IMPLANT
PAD CAST 3X4 CTTN HI CHSV (CAST SUPPLIES) IMPLANT
PAD CAST 4YDX4 CTTN HI CHSV (CAST SUPPLIES) ×3 IMPLANT
PADDING CAST ABS 4INX4YD NS (CAST SUPPLIES) ×1
PADDING CAST ABS COTTON 4X4 ST (CAST SUPPLIES) ×1 IMPLANT
PADDING CAST COTTON 3X4 STRL (CAST SUPPLIES) ×2
PADDING CAST COTTON 4X4 STRL (CAST SUPPLIES) ×6
PENCIL SMOKE EVACUATOR (MISCELLANEOUS) ×2 IMPLANT
SLEEVE SCD COMPRESS KNEE MED (STOCKING) IMPLANT
SLING ARM FOAM STRAP LRG (SOFTGOODS) IMPLANT
SLING ARM FOAM STRAP MED (SOFTGOODS) ×1 IMPLANT
STOCKINETTE 8 INCH (MISCELLANEOUS) ×2 IMPLANT
SUCTION FRAZIER HANDLE 10FR (MISCELLANEOUS) ×2
SUCTION FRAZIER HANDLE 12FR (TUBING) ×2
SUCTION TUBE FRAZIER 10FR DISP (MISCELLANEOUS) ×1 IMPLANT
SUCTION TUBE FRAZIER 12FR DISP (TUBING) IMPLANT
SUT FIBERWIRE #2 38 REV NDL BL (SUTURE)
SUT FIBERWIRE #2 38 T-5 BLUE (SUTURE)
SUT MNCRL AB 4-0 PS2 18 (SUTURE) IMPLANT
SUT MON AB 2-0 CT1 36 (SUTURE) IMPLANT
SUT VIC AB 0 CT1 27 (SUTURE)
SUT VIC AB 0 CT1 27XBRD ANBCTR (SUTURE) IMPLANT
SUT VIC AB 2-0 SH 27 (SUTURE)
SUT VIC AB 2-0 SH 27XBRD (SUTURE) IMPLANT
SUT VICRYL 3-0 CR8 SH (SUTURE) IMPLANT
SUTURE FIBERWR #2 38 T-5 BLUE (SUTURE) IMPLANT
SUTURE FIBERWR#2 38 REV NDL BL (SUTURE) IMPLANT
SYR BULB EAR ULCER 3OZ GRN STR (SYRINGE) ×2 IMPLANT
TOWEL OR 17X26 10 PK STRL BLUE (TOWEL DISPOSABLE) ×2 IMPLANT
TUBING CONNECTING 10 (TUBING) ×2 IMPLANT
UNDERPAD 30X36 HEAVY ABSORB (UNDERPADS AND DIAPERS) ×2 IMPLANT

## 2021-03-18 NOTE — Discharge Instructions (Addendum)
Fredonia Highland, MD [facility] [address], [address] [phone] (tel)   [phone] (fax)   POST-OPERATIVE INSTRUCTIONS   WOUND CARE Please keep splint clean dry and intact until followup.  You may shower on Post-Op Day #2.  You must keep splint dry during this process and may find that a plastic bag taped around the extremity or alternatively a towel based bath may be a better option.   If you get your splint wet or if it is damaged please contact our clinic.  EXERCISES Due to your splint being in place you will not be able to bear weight through your extremity.    You may use a sling for comfort Please continue to work on range of motion of your fingers and stretch these multiple times a day to prevent stiffness. Please continue to ambulate and do not stay sitting or lying for too long. Perform foot and wrist pumps to assist in circulation.  FOLLOW-UP If you develop a Fever (>101.5), Redness or Drainage from the surgical incision site, please call our office to arrange for an evaluation. Please call the office to schedule a follow-up appointment for your incision check if you do not already have one, 7-10 days post-operatively.  REGIONAL ANESTHESIA (NERVE BLOCKS) The anesthesia team may have performed a nerve block for you if safe in the setting of your care.  This is a great tool used to minimize pain.  Typically the block may start wearing off overnight but the long acting medicine may last for 3-4 days.  The nerve block wearing off can be a challenging period but please utilize your as needed pain medications to try and manage this period.    POST-OP MEDICATIONS- Multimodal approach to pain control  In general your pain will be controlled with a combination of substances.  Prescriptions unless otherwise discussed are electronically sent to your pharmacy.  This is a carefully made plan we use to minimize narcotic use.      - Acetaminophen - Non-narcotic  pain medicine taken on a scheduled basis   -  Gabapentin - this is a non-narcotic medication to help with pain, take on a scheduled basis  - Oxycodone - This is a strong narcotic, to be used only on an "as needed" basis for SEVERE pain.             -           Zofran - take as needed for nausea  HELPFUL INFORMATION   If you had a block, it will wear off between 8-24 hrs postop typically.  This is period when your pain may go from nearly zero to the pain you would have had postop without the block.  This is an abrupt transition but nothing dangerous is happening.  You may take an extra dose of narcotic when this happens.   You may be more comfortable sleeping in a semi-seated position the first few nights following surgery.  Keep a pillow propped under the elbow and forearm for comfort.  If you have a recliner type of chair it might be beneficial.  If not that is fine too, but it would be helpful to sleep propped up with pillows behind your operated shoulder as well under your elbow and forearm.  This will reduce pulling on the suture lines.   When dressing, put your operative arm in the sleeve first.  When getting undressed, take your operative arm out last.  Loose fitting, button-down shirts are  recommended.  Often in the first days after surgery you may be more comfortable keeping your operative arm under your shirt and not through the sleeve.   You may return to work/school in the next couple of days when you feel up to it.  Desk work and typing in the sling is fine.   We suggest you use the pain medication the first night prior to going to bed, in order to ease any pain when the anesthesia wears off. You should avoid taking pain medications on an empty stomach as it will make you nauseous.   You should wean off your narcotic medicines as soon as you are able.  Most patients will be off or using minimal narcotics before their first postop appointment.    Do not drink alcoholic beverages or take  illicit drugs when taking pain medications.   It is against the law to drive while taking narcotics.  In some states it is against the law to drive while your arm is in a sling.    Pain medication may make you constipated.  Below are a few solutions to try in this order:   - Decrease the amount of pain medication if you aren't having pain.   - Drink lots of decaffeinated fluids.   - Drink prune juice and/or each dried prunes   If the first 3 don't work start with additional solutions   - Take Colace - an over-the-counter stool softener   - Take Senokot - an over-the-counter laxative   - Take Miralax - a stronger over-the-counter laxative

## 2021-03-18 NOTE — Anesthesia Procedure Notes (Signed)
Anesthesia Regional Block: Supraclavicular block   Pre-Anesthetic Checklist: , timeout performed,  Correct Patient, Correct Site, Correct Laterality,  Correct Procedure, Correct Position, site marked,  Risks and benefits discussed,  Surgical consent,  Pre-op evaluation,  At surgeon's request and post-op pain management  Laterality: Left  Prep: chloraprep       Needles:  Injection technique: Single-shot  Needle Type: Echogenic Needle     Needle Length: 5cm  Needle Gauge: 21     Additional Needles:   Narrative:  Start time: 03/18/2021 11:55 AM End time: 03/18/2021 11:58 AM Injection made incrementally with aspirations every 5 mL.  Performed by: Personally  Anesthesiologist: Audry Pili, MD  Additional Notes: No pain on injection. No increased resistance to injection. Injection made in 5cc increments. Good needle visualization. Patient tolerated the procedure well.

## 2021-03-18 NOTE — Progress Notes (Signed)
AssistedDr. Fransisco Beau with left, ultrasound guided, supraclavicular block. Side rails up, monitors on throughout procedure. See vital signs in flow sheet. Tolerated Procedure well.

## 2021-03-18 NOTE — Anesthesia Procedure Notes (Signed)
Procedure Name: Intubation Date/Time: 03/18/2021 12:49 PM Performed by: Gerald Leitz, CRNA Pre-anesthesia Checklist: Patient identified, Patient being monitored, Timeout performed, Emergency Drugs available and Suction available Patient Re-evaluated:Patient Re-evaluated prior to induction Oxygen Delivery Method: Circle system utilized Preoxygenation: Pre-oxygenation with 100% oxygen Induction Type: IV induction Ventilation: Mask ventilation without difficulty Laryngoscope Size: Mac and 3 Grade View: Grade I Tube type: Oral Tube size: 7.0 mm Number of attempts: 1 Placement Confirmation: ETT inserted through vocal cords under direct vision, positive ETCO2 and breath sounds checked- equal and bilateral Secured at: 21 cm Tube secured with: Tape Dental Injury: Teeth and Oropharynx as per pre-operative assessment

## 2021-03-18 NOTE — Interval H&P Note (Signed)
History and Physical Interval Note:  03/18/2021 11:47 AM  Tonya [name]  has presented today for surgery, with the diagnosis of LEFT ELBOW FRACTURE.  The various methods of treatment have been discussed with the patient and family. After consideration of risks, benefits and other options for treatment, the patient has consented to  Procedure(s): OPEN REDUCTION INTERNAL FIXATION (ORIF) ELBOW/OLECRANON FRACTURE (Left) as a surgical intervention.  The patient's history has been reviewed, patient examined, no change in status, stable for surgery.  I have reviewed the patient's chart and labs.  Questions were answered to the patient's satisfaction.     Renette Butters

## 2021-03-18 NOTE — Transfer of Care (Signed)
Immediate Anesthesia Transfer of Care Note  Patient: Tonya [name]  Procedure(s) Performed: Procedure(s): OPEN REDUCTION INTERNAL FIXATION (ORIF) ELBOW/OLECRANON FRACTURE (Left)  Patient Location: PACU  Anesthesia Type:General  Level of Consciousness: Alert, Awake, Oriented  Airway & Oxygen Therapy: Patient Spontanous Breathing  Post-op Assessment: Report given to RN  Post vital signs: Reviewed and stable  Last Vitals:  Vitals:   03/18/21 1152 03/18/21 1200  BP: 123/70 (!) 133/96  Pulse: (!) 109 (!) 108  Resp: 18 16  Temp:    SpO2: 22% 33%    Complications: No apparent anesthesia complications

## 2021-03-18 NOTE — Interval H&P Note (Signed)
History and Physical Interval Note:  03/18/2021 10:11 AM  Tonya [name]  has presented today for surgery, with the diagnosis of LEFT ELBOW FRACTURE.  The various methods of treatment have been discussed with the patient and family. After consideration of risks, benefits and other options for treatment, the patient has consented to  Procedure(s): OPEN REDUCTION INTERNAL FIXATION (ORIF) ELBOW/OLECRANON FRACTURE (Left) as a surgical intervention.  The patient's history has been reviewed, patient examined, no change in status, stable for surgery.  I have reviewed the patient's chart and labs.  Questions were answered to the patient's satisfaction.     Renette Butters

## 2021-03-18 NOTE — Anesthesia Postprocedure Evaluation (Signed)
Anesthesia Post Note  Patient: Tonya [name]  Procedure(s) Performed: OPEN REDUCTION INTERNAL FIXATION (ORIF) ELBOW/OLECRANON FRACTURE (Left: Elbow)     Patient location during evaluation: PACU Anesthesia Type: General Level of consciousness: awake and alert Pain management: pain level controlled Vital Signs Assessment: post-procedure vital signs reviewed and stable Respiratory status: spontaneous breathing, nonlabored ventilation, respiratory function stable and patient connected to nasal cannula oxygen Cardiovascular status: blood pressure returned to baseline and stable Postop Assessment: no apparent nausea or vomiting Anesthetic complications: no   No notable events documented.  Last Vitals:  Vitals:   03/18/21 1515 03/18/21 1530  BP: 112/75 117/67  Pulse: (!) 107 (!) 103  Resp: 16   Temp:    SpO2: 91% 90%    Last Pain:  Vitals:   03/18/21 1430  TempSrc:   PainSc: 0-No pain                 Audry Pili

## 2021-03-18 NOTE — Op Note (Signed)
03/18/2021  1:54 PM  PATIENT:  Tonya [name]    PRE-OPERATIVE DIAGNOSIS:  LEFT ELBOW FRACTURE  POST-OPERATIVE DIAGNOSIS:  Same  PROCEDURE:  OPEN REDUCTION INTERNAL FIXATION (ORIF) ELBOW/OLECRANON FRACTURE  SURGEON:  Renette Butters, MD  ASSISTANT: Aggie Moats, PA-C, he was present and scrubbed throughout the case, critical for completion in a timely fashion, and for retraction, instrumentation, and closure.   ANESTHESIA:   gen  PREOPERATIVE INDICATIONS:  Tonya [name] is a  80 y.o. female with a diagnosis of LEFT ELBOW FRACTURE who failed conservative measures and elected for surgical management.    The risks benefits and alternatives were discussed with the patient preoperatively including but not limited to the risks of infection, bleeding, nerve injury, cardiopulmonary complications, the need for revision surgery, among others, and the patient was willing to proceed.  OPERATIVE IMPLANTS: tension band with K wires and Fibertape  OPERATIVE FINDINGS: unstable olecranon fracture  BLOOD LOSS: minimal  COMPLICATIONS: non  TOURNIQUET TIME: see operative report  OPERATIVE PROCEDURE:  Patient was identified in the preoperative holding area and site was marked by me She was transported to the operating theater and placed on the table in lateral position taking care to pad all bony prominences. After a preincinduction time out anesthesia was induced. The left upper extremity was prepped and draped in normal sterile fashion and a pre-incision timeout was performed. She received vanc for preoperative antibiotics.   I made an incision directly over the olecranon fracture.  Ulnar nerve was protected throughout.  I dissected down to the periosteum and this was incised over the fracture.  I then removed any soft tissue from within the fracture and reduce this fracture and held it with a tenaculum  2 K wires were then placed across the fracture in a bicortical fashion.  I used  multiple fluoroscopic views of the elbow to confirm this.  I also confirmed anatomic alignment  I then drilled 2 holes distally in the ulna and was able to pass the fiber tape through the interosseous tunnel and then in a figure-of-eight fashion around the K wires.  I tension this.  I then backed the K wires up bent and tamped them back down tucking them below the triceps.  I examined 4 views of the olecranon with fluoroscopy was happy with the fracture reduction and hardware alignment thorough irrigation was performed prior to pinning of the joint and of the wound.  Skin was then closed in layers sterile dressing and a long-arm splint was applied patient was awoken and taken to the PACU in stable condition  POST OPERATIVE PLAN: sling full time. Dvt px: mobilize and chemical when indicated

## 2021-03-18 NOTE — Anesthesia Preprocedure Evaluation (Addendum)
Anesthesia Evaluation  Patient identified by MRN, date of birth, ID band Patient awake    Reviewed: Allergy & Precautions, NPO status , Patient's Chart, lab work & pertinent test results  History of Anesthesia Complications (+) PONV and history of anesthetic complications  Airway Mallampati: III  TM Distance: >3 FB Neck ROM: Full    Dental  (+) Dental Advisory Given, Edentulous Upper, Partial Lower   Pulmonary neg pulmonary ROS,    Pulmonary exam normal        Cardiovascular hypertension, Pt. on medications + CAD and + Peripheral Vascular Disease  + dysrhythmias Atrial Fibrillation  Rhythm:Irregular Rate:Normal   '19 TTE - EF 55% to 60%. LA was moderately dilated. Trivial MR. Mild PR and TR.    Neuro/Psych PSYCHIATRIC DISORDERS Anxiety Depression negative neurological ROS     GI/Hepatic Neg liver ROS, GERD  Medicated, Colon cancer    Endo/Other  diabetes, Type 2, Oral Hypoglycemic Agents  Renal/GU CRFRenal disease     Musculoskeletal  (+) Arthritis ,   Abdominal   Peds  Hematology  On eliquis    Anesthesia Other Findings   Reproductive/Obstetrics                           Anesthesia Physical Anesthesia Plan  ASA: 3  Anesthesia Plan: General   Post-op Pain Management:  Regional for Post-op pain   Induction: Intravenous  PONV Risk Score and Plan: 4 or greater and Treatment may vary due to age or medical condition, Ondansetron and Aprepitant  Airway Management Planned: Oral ETT  Additional Equipment: None  Intra-op Plan:   Post-operative Plan: Extubation in OR  Informed Consent: I have reviewed the patients History and Physical, chart, labs and discussed the procedure including the risks, benefits and alternatives for the proposed anesthesia with the patient or authorized representative who has indicated his/her understanding and acceptance.     Dental advisory  given  Plan Discussed with: CRNA and Anesthesiologist  Anesthesia Plan Comments:        Anesthesia Quick Evaluation

## 2021-03-19 ENCOUNTER — Telehealth: Payer: Self-pay | Admitting: Family Medicine

## 2021-03-19 ENCOUNTER — Encounter (HOSPITAL_COMMUNITY): Payer: Self-pay | Admitting: Orthopedic Surgery

## 2021-03-19 LAB — HEMOGLOBIN A1C
Hgb A1c MFr Bld: 6.1 % — ABNORMAL HIGH (ref 4.8–5.6)
Mean Plasma Glucose: 128 mg/dL

## 2021-03-19 NOTE — Telephone Encounter (Signed)
Patients daughter would like to discuss some memory concerns she has about her mother prior to her appt on 03/28/21.  Please give her a call to discuss.   Memory concerns include Forgetting food she is eating Getting upset about not knowing where she is at while traveling Happened over the last six months

## 2021-03-19 NOTE — Telephone Encounter (Signed)
Spoke with Tonya [name].  She states over the past 6 months she has noticed a decline in her mom's memory.  Tonya [name] fell and had to have surgery on her elbow yesterday.  She could not eat or drink anything prior to surgery.  Tonya [name] said she told her mom that and it wasn't long after that she ask if she could have something to eat.  Tonya [name] states Tonya [name] has lived with them for 7 years. 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  They are constantly having to remind her of things and she gets upset/angry when they correct her.  She states Tonya [name] still drives and will get upset if she doesn't recognized where they are but once she sees something familiar, she is okay.   Tonya [name] usually picks up her grand kids from school.  One day she called Tonya [name] because she was at the school and the doors were locked and no one would let her in to go get the children. Tonya [name] states that this happened on a Saturday.   She also mentioned that they have done puzzles together for many years.  Normally they would do a 1000 pc puzzle but now Mrs. [name] get overwhelmed and can't find pieces to fit.  Tonya [name] states she does better now if it is only a 500 piece puzzle.  Tonya [name] wanted to know if this could possible be caused by a medication Ms. [name] is taking.  She wanted to make Tonya [name] aware of these things prior to their appointment in July.  She states her Tonya [name] has also noticed the decline over the past few months.

## 2021-03-21 NOTE — Telephone Encounter (Signed)
Left message for Tonya [name] that on med list review.Marland Kitchen oxycodone, gabapentin and zofran all could cause confusion... if still taking these we can try weaning off them per Dr. Diona Browner but will discuss further with family at upcoming appointment.

## 2021-03-28 ENCOUNTER — Ambulatory Visit (INDEPENDENT_AMBULATORY_CARE_PROVIDER_SITE_OTHER): Payer: Medicare Other | Admitting: Family Medicine

## 2021-03-28 ENCOUNTER — Encounter: Payer: Self-pay | Admitting: Family Medicine

## 2021-03-28 ENCOUNTER — Other Ambulatory Visit: Payer: Self-pay

## 2021-03-28 VITALS — BP 122/80 | HR 113 | Temp 97.6°F | Ht 66.0 in | Wt 166.5 lb

## 2021-03-28 DIAGNOSIS — S42135D Nondisplaced fracture of coracoid process, left shoulder, subsequent encounter for fracture with routine healing: Secondary | ICD-10-CM | POA: Diagnosis not present

## 2021-03-28 DIAGNOSIS — R413 Other amnesia: Secondary | ICD-10-CM | POA: Diagnosis not present

## 2021-03-28 LAB — COMPREHENSIVE METABOLIC PANEL
ALT: 17 U/L (ref 0–35)
AST: 17 U/L (ref 0–37)
Albumin: 4 g/dL (ref 3.5–5.2)
Alkaline Phosphatase: 91 U/L (ref 39–117)
BUN: 27 mg/dL — ABNORMAL HIGH (ref 6–23)
CO2: 25 mEq/L (ref 19–32)
Calcium: 7.4 mg/dL — ABNORMAL LOW (ref 8.4–10.5)
Chloride: 107 mEq/L (ref 96–112)
Creatinine, Ser: 1.57 mg/dL — ABNORMAL HIGH (ref 0.40–1.20)
GFR: 30.86 mL/min — ABNORMAL LOW (ref >60.00)
Glucose, Bld: 136 mg/dL — ABNORMAL HIGH (ref 70–99)
Potassium: 4 mEq/L (ref 3.5–5.1)
Sodium: 142 mEq/L (ref 135–145)
Total Bilirubin: 0.5 mg/dL (ref 0.2–1.2)
Total Protein: 6.4 g/dL (ref 6.0–8.3)

## 2021-03-28 LAB — CBC WITH DIFFERENTIAL/PLATELET
Basophils Absolute: 0.1 10*3/uL (ref 0.0–0.1)
Basophils Relative: 0.9 % (ref 0.0–3.0)
Eosinophils Absolute: 0.1 10*3/uL (ref 0.0–0.7)
Eosinophils Relative: 1.4 % (ref 0.0–5.0)
HCT: 36.3 % (ref 36.0–46.0)
Hemoglobin: 12.4 g/dL (ref 12.0–15.0)
Lymphocytes Relative: 16.8 % (ref 12.0–46.0)
Lymphs Abs: 1.7 10*3/uL (ref 0.7–4.0)
MCHC: 34.1 g/dL (ref 30.0–36.0)
MCV: 91.8 fl (ref 78.0–100.0)
Monocytes Absolute: 0.7 10*3/uL (ref 0.1–1.0)
Monocytes Relative: 6.7 % (ref 3.0–12.0)
Neutro Abs: 7.5 10*3/uL (ref 1.4–7.7)
Neutrophils Relative %: 74.2 % (ref 43.0–77.0)
Platelets: 234 10*3/uL (ref 150.0–400.0)
RBC: 3.96 Mil/uL (ref 3.87–5.11)
RDW: 13.3 % (ref 11.5–15.5)
WBC: 10.2 10*3/uL (ref 4.0–10.5)

## 2021-03-28 LAB — TSH: TSH: 2.8 u[IU]/mL (ref 0.35–5.50)

## 2021-03-28 LAB — VITAMIN D 25 HYDROXY (VIT D DEFICIENCY, FRACTURES): VITD: 31.15 ng/mL (ref 30.00–100.00)

## 2021-03-28 LAB — AMMONIA: Ammonia: 40 umol/L — ABNORMAL HIGH (ref 11–35)

## 2021-03-28 LAB — VITAMIN B12: Vitamin B-12: 139 pg/mL — ABNORMAL LOW (ref 211–911)

## 2021-03-28 NOTE — Assessment & Plan Note (Signed)
Most consistent with  Possible ischemic dementia. Will eval for easily reversible causes of memory loss with labs.  No clear sign of infeciton.  Depression stable.  No clear medication SE.. recommended stopping gabapentin as soon as pain controlled  MMSE 27/30: short term recall deficit and some changes in executive functioning.

## 2021-03-28 NOTE — Patient Instructions (Addendum)
Please stop at the lab to have labs drawn.  

## 2021-03-28 NOTE — Progress Notes (Signed)
Patient ID: Tonya [name], female    DOB: 23-Sep-1941, 80 y.o.   MRN: [id number]  This visit was conducted in person.  BP 122/80   Pulse (!) 113   Temp 97.6 F (36.4 C) (Temporal)   Ht _0  (1.676 m)   Wt 166 lb 8 oz (75.5 kg)   SpO2 96%   BMI 26.87 kg/m    CC: Chief Complaint  Patient presents with   Memory Loss    Subjective:   HPI: Tonya [name] is a 80 y.o. female presenting on 03/28/2021 for Memory Loss Hx of MDD, CAD   On neurontin ( started last week), and celexa, but no  other new meds.  No  dysuria, change in frequency and urgency. Has  not used oxycodone and zofran... was in setting of recent surgery.  Mood doing fairly well overall.  No family history of dementia.  Daughter Tonya [name] reports in phone note ( reviewed): She states over the past 6 months she has noticed a decline in her mom's memory.  Tonya [name] fell and had to have surgery on her elbow yesterday.  She could not eat or drink anything prior to surgery.  Tonya [name] said she told her mom that and it wasn't long after that she ask if she could have something to eat.  Tonya [name] states Tonya [name] has lived with them for 7 years. 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  They are constantly having to remind her of things and she gets upset/angry when they correct her.  She states Tonya [name] still drives and will get upset if she doesn't recognized where they are but once she sees something familiar, she is okay.   Tonya [name] usually picks up her grand kids from school.  One day she called Tonya [name] because she was at the school and the doors were locked and no one would let her in to go get the children. Tonya [name] states that this happened on a Saturday.   She also mentioned that they have done puzzles together for many years.  Normally they would do a 1000 pc puzzle but now Tonya [name] get overwhelmed and can't find pieces to fit.   Tonya [name] states she does better now if it is only a 500 piece puzzle.    The patient reports in last month decrease in memory.  She has noted decreased remote memory, she feels more imbalance.  No increase in falls until 2 weeks ago.. s/p elbow fracture and resulting surgery on 03/18/2021 to repair.. Dr. Percell Miller.   Daughter has noted issue in last 6 months minor issues, but much worse in last 1-2 months.  Feels like noted sudden drops in memory function, not so gradaul.  She has had spells of confusion.  03/18/21 Cbc wbc was 10.8, hg 12.5 A1C 6.1  Reviewed recent surgical notes and labs.  Relevant past medical, surgical, family and social history reviewed and updated as indicated. Interim medical history since our last visit reviewed. Allergies and medications reviewed and updated. Outpatient Medications Prior to Visit  Medication Sig Dispense Refill   acetaminophen (TYLENOL) 500 MG tablet Take 2 tablets (1,000 mg total) by mouth every 8 (eight) hours for 14 days. 84 tablet 0   apixaban (ELIQUIS) 2.5 MG TABS tablet Take 1 tablet (2.5 mg total) by mouth 2 (two) times daily. 60 tablet 11   atorvastatin (  LIPITOR) 20 MG tablet TAKE 1 TABLET BY MOUTH EVERY DAY 90 tablet 1   Blood Glucose Monitoring Suppl (ACCU-CHEK AVIVA PLUS) w/Device KIT Use to check blood once sugar daily. 1 kit 0   carvedilol (COREG) 12.5 MG tablet TAKE 1 TABLET (12.5 MG TOTAL) BY MOUTH 2 (TWO) TIMES DAILY WITH A MEAL. 180 tablet 3   cetirizine (ZYRTEC) 10 MG tablet Take 10 mg by mouth at bedtime.     cholestyramine light (PREVALITE) 4 g packet USE 1 PACKET TWICE DAILY WITH A MEAL, AT LEAST 2 HOURS AWAY FROM OTHER MEDS 540 packet 0   citalopram (CELEXA) 20 MG tablet TAKE 1 TABLET BY MOUTH EVERY DAY 90 tablet 1   diltiazem (CARDIZEM CD) 180 MG 24 hr capsule TAKE 1 CAPSULE BY MOUTH EVERY DAY 90 capsule 3   ferrous sulfate 325 (65 FE) MG EC tablet Take 325 mg by mouth 2 (two) times daily.     gabapentin (NEURONTIN) 100 MG capsule  Take 1 capsule (100 mg total) by mouth 3 (three) times daily for 14 days. For pain. 42 capsule 0   glucose blood (ACCU-CHEK AVIVA PLUS) test strip Use to check blood sugar daily. 100 each 3   isosorbide mononitrate (IMDUR) 30 MG 24 hr tablet TAKE 1 TABLET BY MOUTH EVERY DAY 90 tablet 2   losartan (COZAAR) 100 MG tablet TAKE 1 TABLET BY MOUTH EVERY DAY 90 tablet 3   omeprazole (PRILOSEC) 20 MG capsule TAKE 1 CAPSULE BY MOUTH EVERY DAY 90 capsule 3   Vitamin D, Ergocalciferol, (DRISDOL) 1.25 MG (50000 UNIT) CAPS capsule TAKE 1 CAPSULE (50,000 UNITS TOTAL) BY MOUTH EVERY 7 (SEVEN) DAYS 12 capsule 0   diclofenac sodium (VOLTAREN) 1 % GEL Apply 2 g topically 4 (four) times daily. (Patient taking differently: Apply 2 g topically daily as needed (pain and itching).) 100 g 3   No facility-administered medications prior to visit.     Per HPI unless specifically indicated in ROS section below Review of Systems Objective:  BP 122/80   Pulse (!) 113   Temp 97.6 F (36.4 C) (Temporal)   Ht _0  (1.676 m)   Wt 166 lb 8 oz (75.5 kg)   SpO2 96%   BMI 26.87 kg/m   Wt Readings from Last 3 Encounters:  03/28/21 166 lb 8 oz (75.5 kg)  03/18/21 161 lb 3.2 oz (73.1 kg)  01/09/21 163 lb 4 oz (74 kg)      Physical Exam  MMSE 27/20 Normal clock drawing   Animal recal: 4 in 15 sec.    Results for orders placed or performed during the hospital encounter of 03/18/21  Surgical pcr screen   Specimen: Nasal Mucosa; Nasal Swab  Result Value Ref Range   MRSA, PCR NEGATIVE NEGATIVE   Staphylococcus aureus NEGATIVE NEGATIVE  Hemoglobin A1c per protocol  Result Value Ref Range   Hgb A1c MFr Bld 6.1 (H) 4.8 - 5.6 %   Mean Plasma Glucose 128 mg/dL  Basic metabolic panel per protocol  Result Value Ref Range   Sodium 136 135 - 145 mmol/L   Potassium 5.0 3.5 - 5.1 mmol/L   Chloride 107 98 - 111 mmol/L   CO2 19 (L) 22 - 32 mmol/L   Glucose, Bld 154 (H) 70 - 99 mg/dL   BUN 32 (H) 8 - 23 mg/dL    Creatinine, Ser 1.54 (H) 0.44 - 1.00 mg/dL   Calcium 8.5 (L) 8.9 - 10.3 mg/dL   GFR, Estimated 34 (L) >  60 mL/min   Anion gap 10 5 - 15  CBC per protocol  Result Value Ref Range   WBC 10.8 (H) 4.0 - 10.5 K/uL   RBC 4.00 3.87 - 5.11 MIL/uL   Hemoglobin 12.5 12.0 - 15.0 g/dL   HCT 37.1 36.0 - 46.0 %   MCV 92.8 80.0 - 100.0 fL   MCH 31.3 26.0 - 34.0 pg   MCHC 33.7 30.0 - 36.0 g/dL   RDW 12.7 11.5 - 15.5 %   Platelets 233 150 - 400 K/uL   nRBC 0.0 0.0 - 0.2 %  Glucose, capillary  Result Value Ref Range   Glucose-Capillary 141 (H) 70 - 99 mg/dL    This visit occurred during the SARS-CoV-2 public health emergency.  Safety protocols were in place, including screening questions prior to the visit, additional usage of staff PPE, and extensive cleaning of exam room while observing appropriate contact time as indicated for disinfecting solutions.   COVID 19 screen:  No recent travel or known exposure to COVID19 The patient denies respiratory symptoms of COVID 19 at this time. The importance of social distancing was discussed today.   Assessment and Plan    Problem List Items Addressed This Visit     Memory loss - Primary    Most consistent with  Possible ischemic dementia. Will eval for easily reversible causes of memory loss with labs.  No clear sign of infeciton.  Depression stable.  No clear medication SE.. recommended stopping gabapentin as soon as pain controlled  MMSE 27/30: short term recall deficit and some changes in executive functioning.         Relevant Orders   CBC with Differential/Platelet   Comprehensive metabolic panel   Vitamin P29   TSH   VITAMIN D 25 Hydroxy (Vit-D Deficiency, Fractures)   Ammonia     [name], MD

## 2021-04-01 NOTE — Addendum Note (Signed)
Addended byEliezer Lofts E on: 04/01/2021 05:00 PM   Modules accepted: Orders

## 2021-04-03 ENCOUNTER — Other Ambulatory Visit (INDEPENDENT_AMBULATORY_CARE_PROVIDER_SITE_OTHER): Payer: Medicare Other

## 2021-04-03 ENCOUNTER — Other Ambulatory Visit: Payer: Self-pay

## 2021-04-03 ENCOUNTER — Ambulatory Visit (INDEPENDENT_AMBULATORY_CARE_PROVIDER_SITE_OTHER): Payer: Medicare Other

## 2021-04-03 DIAGNOSIS — E538 Deficiency of other specified B group vitamins: Secondary | ICD-10-CM

## 2021-04-03 MED ORDER — CYANOCOBALAMIN 1000 MCG/ML IJ SOLN
1000.0000 ug | Freq: Once | INTRAMUSCULAR | Status: AC
  Administered 2021-04-03: 1000 ug via INTRAMUSCULAR

## 2021-04-03 NOTE — Progress Notes (Signed)
Per orders of Dr. Diona Browner, #1 of 2 every 2 weeks injection of B12 1000 mcg/ml given by Pilar Grammes, CMA in Right Deltoid. Patient tolerated injection well.  B12 injection every 2 weeks x 2 followed by monthly per Dr Diona Browner.

## 2021-04-04 LAB — PTH, INTACT AND CALCIUM
Calcium: 7.4 mg/dL — ABNORMAL LOW (ref 8.6–10.4)
PTH: 113 pg/mL — ABNORMAL HIGH (ref 16–77)

## 2021-04-08 ENCOUNTER — Encounter: Payer: Self-pay | Admitting: Family Medicine

## 2021-04-08 DIAGNOSIS — E208 Other hypoparathyroidism: Secondary | ICD-10-CM | POA: Insufficient documentation

## 2021-04-09 ENCOUNTER — Telehealth: Payer: Self-pay | Admitting: *Deleted

## 2021-04-09 ENCOUNTER — Ambulatory Visit
Admission: RE | Admit: 2021-04-09 | Discharge: 2021-04-09 | Disposition: A | Discharge status: Home / Self Care | Payer: Medicare Other | Source: Ambulatory Visit | Attending: Family Medicine | Admitting: Family Medicine

## 2021-04-09 ENCOUNTER — Other Ambulatory Visit: Payer: Self-pay

## 2021-04-09 DIAGNOSIS — R413 Other amnesia: Secondary | ICD-10-CM | POA: Insufficient documentation

## 2021-04-09 DIAGNOSIS — J019 Acute sinusitis, unspecified: Secondary | ICD-10-CM | POA: Diagnosis not present

## 2021-04-09 DIAGNOSIS — G319 Degenerative disease of nervous system, unspecified: Secondary | ICD-10-CM | POA: Diagnosis not present

## 2021-04-09 NOTE — Telephone Encounter (Signed)
Olivia Mackie at [facility] called stating that she has a call report on the patient. Olivia Mackie stated that the results are in [place].  Dr. Diona Browner is out of the office so message is being sent to Allie Bossier NP and she is aware.

## 2021-04-09 NOTE — Telephone Encounter (Signed)
Agree. Will discuss results with patient and family on 7/15.

## 2021-04-09 NOTE — Telephone Encounter (Signed)
Reviewed results and PCP's notes.  Patient was evaluated for chronic decline in memory/progression of dementia, increased over last 1-2 months.  The 9 mm ovoid focus representing aneurysm appears to be an incidental finding as there is no mention of acute headaches/visual changes, however there was mention in recent fall with increased imbalance.   Agree that she needs additional imaging, will send to PCP and also covering physician for PCP.  Patient does not need to be seen emergently tonight.

## 2021-04-11 ENCOUNTER — Telehealth: Payer: Self-pay | Admitting: *Deleted

## 2021-04-11 ENCOUNTER — Other Ambulatory Visit: Payer: Self-pay | Admitting: Family Medicine

## 2021-04-11 ENCOUNTER — Encounter: Payer: Self-pay | Admitting: Family Medicine

## 2021-04-11 DIAGNOSIS — N184 Chronic kidney disease, stage 4 (severe): Secondary | ICD-10-CM

## 2021-04-11 DIAGNOSIS — F0150 Vascular dementia without behavioral disturbance: Secondary | ICD-10-CM | POA: Insufficient documentation

## 2021-04-11 DIAGNOSIS — N2581 Secondary hyperparathyroidism of renal origin: Secondary | ICD-10-CM | POA: Insufficient documentation

## 2021-04-11 DIAGNOSIS — E1122 Type 2 diabetes mellitus with diabetic chronic kidney disease: Secondary | ICD-10-CM

## 2021-04-11 DIAGNOSIS — I999 Unspecified disorder of circulatory system: Secondary | ICD-10-CM

## 2021-04-11 DIAGNOSIS — I671 Cerebral aneurysm, nonruptured: Secondary | ICD-10-CM

## 2021-04-11 NOTE — Telephone Encounter (Signed)
Tonya [name] (daughter) left voicemail on triage line wanting to know what the next steps are for her mom after her lab work and MRI.   Please advise.

## 2021-04-12 ENCOUNTER — Other Ambulatory Visit: Payer: Self-pay | Admitting: Cardiology

## 2021-04-14 ENCOUNTER — Other Ambulatory Visit: Payer: Self-pay

## 2021-04-14 MED ORDER — LOSARTAN POTASSIUM 100 MG PO TABS: 100.0000 mg | ORAL_TABLET | Freq: Every day | ORAL | 3 refills | Status: DC

## 2021-04-17 ENCOUNTER — Other Ambulatory Visit: Payer: Self-pay

## 2021-04-17 ENCOUNTER — Ambulatory Visit (INDEPENDENT_AMBULATORY_CARE_PROVIDER_SITE_OTHER): Payer: Medicare Other

## 2021-04-17 DIAGNOSIS — E538 Deficiency of other specified B group vitamins: Secondary | ICD-10-CM

## 2021-04-17 MED ORDER — CYANOCOBALAMIN 1000 MCG/ML IJ SOLN
1000.0000 ug | Freq: Once | INTRAMUSCULAR | Status: AC
  Administered 2021-04-17: 1000 ug via INTRAMUSCULAR

## 2021-04-17 NOTE — Progress Notes (Signed)
Per orders of Dr. Diona Browner, Tonya [name], an injection of Cyanocobalamin was given by Ophelia Shoulder. Patient tolerated injection well.

## 2021-04-22 DIAGNOSIS — N184 Chronic kidney disease, stage 4 (severe): Secondary | ICD-10-CM | POA: Diagnosis not present

## 2021-04-22 DIAGNOSIS — N2581 Secondary hyperparathyroidism of renal origin: Secondary | ICD-10-CM | POA: Diagnosis not present

## 2021-04-22 DIAGNOSIS — E1122 Type 2 diabetes mellitus with diabetic chronic kidney disease: Secondary | ICD-10-CM | POA: Diagnosis not present

## 2021-04-22 DIAGNOSIS — I10 Essential (primary) hypertension: Secondary | ICD-10-CM | POA: Diagnosis not present

## 2021-04-23 ENCOUNTER — Other Ambulatory Visit: Payer: Self-pay

## 2021-04-23 ENCOUNTER — Ambulatory Visit
Admission: RE | Admit: 2021-04-23 | Discharge: 2021-04-23 | Disposition: A | Discharge status: Home / Self Care | Payer: Medicare Other | Source: Ambulatory Visit | Attending: Family Medicine | Admitting: Family Medicine

## 2021-04-23 DIAGNOSIS — I671 Cerebral aneurysm, nonruptured: Secondary | ICD-10-CM | POA: Diagnosis not present

## 2021-04-23 DIAGNOSIS — I999 Unspecified disorder of circulatory system: Secondary | ICD-10-CM

## 2021-04-28 DIAGNOSIS — S52022A Displaced fracture of olecranon process without intraarticular extension of left ulna, initial encounter for closed fracture: Secondary | ICD-10-CM | POA: Diagnosis not present

## 2021-05-01 ENCOUNTER — Ambulatory Visit: Payer: Medicare Other

## 2021-05-01 ENCOUNTER — Telehealth: Payer: Self-pay

## 2021-05-01 NOTE — Telephone Encounter (Signed)
Pt was on schedule today for B12 inj but she was supposed to receive her next inj one month from her last inj which was on 04/17/21. Per PCP pt was to receive B12 every 2 wks x 2 followed by monthly. Pt has received two injections on 7/21 and 7/7.  Contacted pt and advised next inj should be monthly. RS apt for 31 days. Pt verbalized understanding.

## 2021-05-12 ENCOUNTER — Ambulatory Visit: Payer: Medicare Other | Admitting: Cardiology

## 2021-05-20 ENCOUNTER — Other Ambulatory Visit: Payer: Self-pay | Admitting: Family Medicine

## 2021-05-20 ENCOUNTER — Ambulatory Visit (INDEPENDENT_AMBULATORY_CARE_PROVIDER_SITE_OTHER): Payer: Medicare Other

## 2021-05-20 ENCOUNTER — Other Ambulatory Visit: Payer: Self-pay

## 2021-05-20 DIAGNOSIS — E538 Deficiency of other specified B group vitamins: Secondary | ICD-10-CM | POA: Diagnosis not present

## 2021-05-20 NOTE — Telephone Encounter (Signed)
Last office visit 03/28/2021 for Memory Loss and hypocalcemia.  Last refilled 03/03/2021 for #12 with no refills.  Last Vit D level 03/28/2021 which was normal 31.15 ng/ml.  CPE scheduled 07/22/2021.

## 2021-05-21 MED ORDER — CYANOCOBALAMIN 1000 MCG/ML IJ SOLN
1000.0000 ug | Freq: Once | INTRAMUSCULAR | Status: AC
  Administered 2021-05-20: 1000 ug via INTRAMUSCULAR

## 2021-05-21 NOTE — Progress Notes (Signed)
Per orders of Dr. Diona Browner, injection of Vitamin B12 given by Diamond Nickel, RN.  Administered 37ml to R deltoid IM per patients request as she has recently had surgery to her left elbow and would prefer not to have injection in that arm today.  It has been 1 month since last injection therefore should no contraindication to administering again to R arm.   Patient tolerated injection well.

## 2021-05-28 ENCOUNTER — Other Ambulatory Visit: Payer: Self-pay

## 2021-05-28 ENCOUNTER — Other Ambulatory Visit: Payer: Self-pay | Admitting: Family Medicine

## 2021-05-28 MED ORDER — CHOLESTYRAMINE LIGHT 4 G PO PACK: PACK | ORAL | 0 refills | Status: DC

## 2021-05-31 ENCOUNTER — Other Ambulatory Visit: Payer: Self-pay | Admitting: Family Medicine

## 2021-06-16 ENCOUNTER — Other Ambulatory Visit: Payer: Self-pay | Admitting: Family Medicine

## 2021-06-16 DIAGNOSIS — I4821 Permanent atrial fibrillation: Secondary | ICD-10-CM

## 2021-06-18 ENCOUNTER — Ambulatory Visit (INDEPENDENT_AMBULATORY_CARE_PROVIDER_SITE_OTHER): Payer: Medicare Other

## 2021-06-18 ENCOUNTER — Other Ambulatory Visit: Payer: Self-pay

## 2021-06-18 DIAGNOSIS — E538 Deficiency of other specified B group vitamins: Secondary | ICD-10-CM | POA: Diagnosis not present

## 2021-06-18 MED ORDER — CYANOCOBALAMIN 1000 MCG/ML IJ SOLN
1000.0000 ug | Freq: Once | INTRAMUSCULAR | Status: AC
  Administered 2021-06-18: 1000 ug via INTRAMUSCULAR

## 2021-06-18 NOTE — Progress Notes (Signed)
Per orders of Dr. Damita Dunnings in absence of Dr. Diona Browner, injection of B12 given by Ophelia Shoulder. Patient tolerated injection well.

## 2021-06-19 DIAGNOSIS — N1831 Chronic kidney disease, stage 3a: Secondary | ICD-10-CM | POA: Insufficient documentation

## 2021-06-19 NOTE — Progress Notes (Signed)
  Cardiology Office Note   Date:  06/20/2021   ID:  Tonya [name], DOB 08/09/1941, MRN [id number]  PCP:  Bedsole, Amy E, MD  Cardiologist:   None Referring:  Bedsole, Amy E, MD  Chief Complaint  Patient presents with   Atrial Fibrillation   Shortness of Breath       History of Present Illness: Tonya [name] is a 80 y.o. female who presents for evaluation of atrial fibrillation.  Since I last saw her she has had increasing shortness of breath which her family notices climbing up stairs.  She really does not report this.  They say it takes a little while to recover the top of the stairs.  She is not describing PND or orthopnea.  She said no new palpitations, presyncope or syncope.  She has not had any chest pressure, neck or arm discomfort.  She does have some decreased memory.  She has had evaluation to include an MRI which I looked at with a 1.2 cm saccular aneurysm of the proximal cavernous right ICA  Past Medical History:  Diagnosis Date   Adenocarcinoma, colon (HCC)    Anemia    iron deficiency   Anxiety    Carpal tunnel syndrome    Cataract    REMOVED BILATERAL   Chronic diarrhea    Colon polyps    Tubular adenoma   Degenerative disk disease    Depression    Diabetes mellitus, type 2 (HCC)    Dysrhythmia    h/o AFIB   GERD (gastroesophageal reflux disease)    History of blood transfusion    After surgeries   Hyperlipidemia    Hypertension    IBS (irritable bowel syndrome)    Kidney disorder    Osteoarthritis    Osteoporosis    PONV (postoperative nausea and vomiting)    S/P coronary artery stent placement 2007    Past Surgical History:  Procedure Laterality Date   CARDIAC CATHETERIZATION  12/10   LAD stent patent. Insignificant CAD, otherwise EF 60-65%   CARPAL TUNNEL RELEASE     CATARACT EXTRACTION  03/2015   CHOLECYSTECTOMY     COLON SURGERY     COLONOSCOPY     DILATION AND CURETTAGE OF UTERUS     HEMATOMA EVACUATION     after knee  replacement   KNEE ARTHROSCOPY     bilateral   LUMBAR LAMINECTOMY N/A 11/26/2015   Procedure: LUMBAR DECOMPRESSIVE LAMINECTOMY L3-4 AND L4-5, LEFT L2-3 HEMILAMINECTOMY;  Surgeon:  E Nitka, MD;  Location: [facility];  Service: Orthopedics;  Laterality: N/A;   ORIF ELBOW FRACTURE Left 03/18/2021   Procedure: OPEN REDUCTION INTERNAL FIXATION (ORIF) ELBOW/OLECRANON FRACTURE;  Surgeon: Murphy, Timothy D, MD;  Location: [facility];  Service: Orthopedics;  Laterality: Left;   PARTIAL COLECTOMY  11/2008   right, for adenocarcinoma   REPLACEMENT TOTAL KNEE  04/2010   right, complicated by hemarthrosis    REVERSE SHOULDER ARTHROPLASTY Right 05/28/2016   REVERSE SHOULDER ARTHROPLASTY Right 05/28/2016   Procedure: REVERSE SHOULDER ARTHROPLASTY;  Surgeon: Scott Gregory Dean, MD;  Location: [facility];  Service: Orthopedics;  Laterality: Right;   TONSILLECTOMY     TOTAL KNEE ARTHROPLASTY Left 04/03/2014   dr whitfield   TOTAL KNEE ARTHROPLASTY Left 04/03/2014   Procedure: TOTAL KNEE ARTHROPLASTY;  Surgeon: Peter W Whitfield, MD;  Location: [facility];  Service: Orthopedics;  Laterality: Left;   TOTAL KNEE REVISION  03/08/2012   Procedure: TOTAL KNEE REVISION;  Surgeon: Peter W Whitfield,   MD;  Location: [place];  Service: Orthopedics;  Laterality: Right;  removal total knee hardware and placement of antibiotic cement spacer and antibiotic beads   TOTAL KNEE REVISION  05/17/2012   Procedure: TOTAL KNEE REVISION;  Surgeon: Garald Balding, MD;  Location: [place];  Service: Orthopedics;  Laterality: Right;  right total knee revision, removal of antibiotic spacer     Current Outpatient Medications  Medication Sig Dispense Refill   apixaban (ELIQUIS) 2.5 MG TABS tablet Take 1 tablet (2.5 mg total) by mouth 2 (two) times daily. 60 tablet 11   atorvastatin (LIPITOR) 20 MG tablet TAKE 1 TABLET BY MOUTH EVERY DAY 90 tablet 1   Blood Glucose Monitoring Suppl (ACCU-CHEK AVIVA PLUS) w/Device KIT Use to check blood once sugar daily. 1 kit  0   carvedilol (COREG) 12.5 MG tablet TAKE 1 TABLET (12.5 MG TOTAL) BY MOUTH 2 (TWO) TIMES DAILY WITH A MEAL. 180 tablet 0   cetirizine (ZYRTEC) 10 MG tablet Take 10 mg by mouth at bedtime.     cholestyramine light (PREVALITE) 4 g packet USE 1 PACKET TWICE DAILY WITH A MEAL, AT LEAST 2 HOURS AWAY FROM OTHER MEDS  Patient needs an appointment for further refills. 540 packet 0   citalopram (CELEXA) 20 MG tablet TAKE 1 TABLET BY MOUTH EVERY DAY 90 tablet 1   diltiazem (CARDIZEM CD) 180 MG 24 hr capsule TAKE 1 CAPSULE BY MOUTH EVERY DAY 90 capsule 0   ferrous sulfate 325 (65 FE) MG EC tablet Take 325 mg by mouth 2 (two) times daily.     glucose blood (ACCU-CHEK AVIVA PLUS) test strip Use to check blood sugar daily. 100 each 3   isosorbide mononitrate (IMDUR) 30 MG 24 hr tablet TAKE 1 TABLET BY MOUTH EVERY DAY 90 tablet 2   losartan (COZAAR) 100 MG tablet Take 1 tablet (100 mg total) by mouth daily. 90 tablet 3   omeprazole (PRILOSEC) 20 MG capsule TAKE 1 CAPSULE BY MOUTH EVERY DAY 90 capsule 3   Vitamin D, Ergocalciferol, (DRISDOL) 1.25 MG (50000 UNIT) CAPS capsule TAKE 1 CAPSULE (50,000 UNITS TOTAL) BY MOUTH EVERY 7 (SEVEN) DAYS 12 capsule 0   No current facility-administered medications for this visit.    Allergies:   Amoxicillin-pot clavulanate    ROS:  Please see the history of present illness.   Otherwise, review of systems are positive for none.   All other systems are reviewed and negative.    PHYSICAL EXAM: VS:  BP 130/82 (BP Location: Right Arm, Patient Position: Sitting, Cuff Size: Normal)   Pulse (!) 104   Ht 5' 6" (1.676 m)   Wt 163 lb (73.9 kg)   BMI 26.31 kg/m  , BMI Body mass index is 26.31 kg/m. GENERAL:  Well appearing NECK:  No jugular venous distention, waveform within normal limits, carotid upstroke brisk and symmetric, no bruits, no thyromegaly LUNGS:  Clear to auscultation bilaterally CHEST:  Unremarkable HEART:  PMI not displaced or sustained,S1 and S2 within  normal limits, no S3, no clicks, no rubs, no murmurs, irregular ABD:  Flat, positive bowel sounds normal in frequency in pitch, no bruits, no rebound, no guarding, no midline pulsatile mass, no hepatomegaly, no splenomegaly EXT:  2 plus pulses throughout, no edema, no cyanosis no clubbing  EKG:  EKG is  ordered today. Atrial fibrillation, rate 104, axis within normal limits, intervals within normal limits, no acute ST-T wave changes.  Recent Labs: 03/28/2021: ALT 17; BUN 27; Creatinine, Ser 1.57; Hemoglobin  12.4; Platelets 234.0; Potassium 4.0; Sodium 142; TSH 2.80    Lipid Panel    Component Value Date/Time   CHOL 97 01/02/2021 0754   TRIG 148.0 01/02/2021 0754   TRIG 240 (HH) 09/07/2006 0849   HDL 37.00 (L) 01/02/2021 0754   CHOLHDL 3 01/02/2021 0754   VLDL 29.6 01/02/2021 0754   LDLCALC 30 01/02/2021 0754   LDLDIRECT 50.0 10/22/2014 0841      Wt Readings from Last 3 Encounters:  06/20/21 163 lb (73.9 kg)  03/28/21 166 lb 8 oz (75.5 kg)  03/18/21 161 lb 3.2 oz (73.1 kg)      Other studies Reviewed: Additional studies/ records that were reviewed today include: Labs Review of the above records demonstrates:  Please see elsewhere in the note.     ASSESSMENT AND PLAN:  ATRIAL FIB:  Tonya [name] has a CHA2DS2 - VASc score of 5 .   Her family is going to check her heart rates on a pulse oximeter.  She is on the appropriate dose of anticoagulant.  No change in therapy.   CAD: Given the shortness of breath I am going to want to screen with a stress test but she would not be able to walk on a treadmill.  Therefore, she will have a Lexiscan Myoview.   HTN: The blood pressure is at target.  No change in therapy.   CKD: Creatinine is 1.56 which was stable compared to previous.   SOB: I will evaluate with stress testing as above as well as a BNP level.   Current medicines are reviewed at length with the patient today.  The patient does not have concerns regarding  medicines.  The following changes have been made:  None  Labs/ tests ordered today include:    Orders Placed This Encounter  Procedures   B Nat Peptide   MYOCARDIAL PERFUSION IMAGING   EKG 12-Lead      Disposition:   FU with APP in six months.   Signed,  , MD  06/20/2021 9:13 AM    [facility]  

## 2021-06-20 ENCOUNTER — Other Ambulatory Visit: Payer: Self-pay

## 2021-06-20 ENCOUNTER — Ambulatory Visit: Payer: Medicare Other | Admitting: Cardiology

## 2021-06-20 ENCOUNTER — Encounter: Payer: Self-pay | Admitting: Cardiology

## 2021-06-20 VITALS — BP 130/82 | HR 104 | Ht 66.0 in | Wt 163.0 lb

## 2021-06-20 DIAGNOSIS — R0602 Shortness of breath: Secondary | ICD-10-CM | POA: Diagnosis not present

## 2021-06-20 DIAGNOSIS — N1831 Chronic kidney disease, stage 3a: Secondary | ICD-10-CM | POA: Diagnosis not present

## 2021-06-20 DIAGNOSIS — I2510 Atherosclerotic heart disease of native coronary artery without angina pectoris: Secondary | ICD-10-CM

## 2021-06-20 DIAGNOSIS — I10 Essential (primary) hypertension: Secondary | ICD-10-CM

## 2021-06-20 DIAGNOSIS — I482 Chronic atrial fibrillation, unspecified: Secondary | ICD-10-CM | POA: Diagnosis not present

## 2021-06-20 NOTE — Patient Instructions (Addendum)
Medication Instructions:  Your physician recommends that you continue on your current medications as directed. Please refer to the Current Medication list given to you today.   *If you need a refill on your cardiac medications before your next appointment, please call your pharmacy*  Lab Work: BNP TODAY   If you have labs (blood work) drawn today and your tests are completely normal, you will receive your results only by: [place] (if you have MyChart) OR A paper copy in the mail If you have any lab test that is abnormal or we need to change your treatment, we will call you to review the results.  Testing/Procedures: Your physician has requested that you have a lexiscan myoview. For further information please visit [email or web address]. Please follow instruction sheet, as given.  Follow-Up: At [facility], you and your health needs are our priority.  As part of our continuing mission to provide you with exceptional heart care, we have created designated Provider Care Teams.  These Care Teams include your primary Cardiologist (physician) and Advanced Practice Providers (APPs -  Physician Assistants and Nurse Practitioners) who all work together to provide you with the care you need, when you need it.  We recommend signing up for the patient portal called "MyChart".  Sign up information is provided on this After Visit Summary.  MyChart is used to connect with patients for Virtual Visits (Telemedicine).  Patients are able to view lab/test results, encounter notes, upcoming appointments, etc.  Non-urgent messages can be sent to your provider as well.   To learn more about what you can do with MyChart, go to [email or web address].    Your next appointment:   6 month(s)  The format for your next appointment:   In Person  Provider:   You will see one of the following Advanced Practice Providers on your designated Care Team:   Rosaria Ferries, PA-C Caron Presume, PA-C Jory Sims, DNP, ANP  Then, [facility] will plan to see you again in 12 month(s).

## 2021-06-21 LAB — BRAIN NATRIURETIC PEPTIDE: BNP: 217.1 pg/mL — ABNORMAL HIGH (ref 0.0–100.0)

## 2021-07-02 ENCOUNTER — Telehealth (HOSPITAL_COMMUNITY): Payer: Self-pay | Admitting: *Deleted

## 2021-07-02 ENCOUNTER — Telehealth: Payer: Self-pay | Admitting: Family Medicine

## 2021-07-02 ENCOUNTER — Other Ambulatory Visit: Payer: Self-pay | Admitting: Family Medicine

## 2021-07-02 DIAGNOSIS — D638 Anemia in other chronic diseases classified elsewhere: Secondary | ICD-10-CM

## 2021-07-02 DIAGNOSIS — E1121 Type 2 diabetes mellitus with diabetic nephropathy: Secondary | ICD-10-CM

## 2021-07-02 NOTE — Telephone Encounter (Signed)
Close encounter 

## 2021-07-02 NOTE — Telephone Encounter (Signed)
-----   Message from Ellamae Sia sent at 06/23/2021 11:04 AM EDT ----- Regarding: Lab orders for Tuesday, 10.18.22 Patient is scheduled for CPX labs, please order future labs, Thanks , Karna Christmas

## 2021-07-04 ENCOUNTER — Other Ambulatory Visit: Payer: Self-pay

## 2021-07-04 ENCOUNTER — Ambulatory Visit (HOSPITAL_COMMUNITY)
Admission: RE | Admit: 2021-07-04 | Discharge: 2021-07-04 | Disposition: A | Discharge status: Home / Self Care | Payer: Medicare Other | Source: Ambulatory Visit | Attending: Cardiovascular Disease | Admitting: Cardiovascular Disease

## 2021-07-04 DIAGNOSIS — I10 Essential (primary) hypertension: Secondary | ICD-10-CM | POA: Diagnosis not present

## 2021-07-04 DIAGNOSIS — I2510 Atherosclerotic heart disease of native coronary artery without angina pectoris: Secondary | ICD-10-CM | POA: Insufficient documentation

## 2021-07-04 DIAGNOSIS — R0602 Shortness of breath: Secondary | ICD-10-CM | POA: Insufficient documentation

## 2021-07-04 LAB — MYOCARDIAL PERFUSION IMAGING
Peak HR: 100 {beats}/min
Rest HR: 82 {beats}/min
Rest Nuclear Isotope Dose: 10.9 mCi
SDS: 1
SRS: 0
SSS: 1
ST Depression (mm): 0 mm
Stress Nuclear Isotope Dose: 31.9 mCi
TID: 0.93

## 2021-07-04 MED ORDER — TECHNETIUM TC 99M TETROFOSMIN IV KIT
10.9000 | PACK | Freq: Once | INTRAVENOUS | Status: AC | PRN
  Administered 2021-07-04: 10.9 via INTRAVENOUS
  Filled 2021-07-04: qty 11

## 2021-07-04 MED ORDER — TECHNETIUM TC 99M TETROFOSMIN IV KIT
31.9000 | PACK | Freq: Once | INTRAVENOUS | Status: AC | PRN
  Administered 2021-07-04: 31.9 via INTRAVENOUS
  Filled 2021-07-04: qty 32

## 2021-07-04 MED ORDER — AMINOPHYLLINE 25 MG/ML IV SOLN
75.0000 mg | Freq: Once | INTRAVENOUS | Status: AC
  Administered 2021-07-04: 75 mg via INTRAVENOUS

## 2021-07-04 MED ORDER — REGADENOSON 0.4 MG/5ML IV SOLN
0.4000 mg | Freq: Once | INTRAVENOUS | Status: AC
  Administered 2021-07-04: 0.4 mg via INTRAVENOUS

## 2021-07-15 ENCOUNTER — Telehealth: Payer: Self-pay

## 2021-07-15 ENCOUNTER — Other Ambulatory Visit: Payer: Medicare Other

## 2021-07-15 ENCOUNTER — Ambulatory Visit: Payer: Medicare Other

## 2021-07-15 NOTE — Chronic Care Management (AMB) (Addendum)
Chronic Care Management Pharmacy Assistant   Name: Tonya [name]  MRN: [id number] DOB: 07-06-1941  Reason for Encounter: Hypertension Disease State   Recent office visits:  03/28/21-PCP-Patient presented for memory loss-recommended stopping gabapentin as soon as pain controlled,labs ordered-As far as memory evaluation.. kidney function is stable, no clear new liver issues ( ammonia slightly high but not enough to cause a problem). Electrolytes normal. Calcium level is significantly low...has she had an parathyroid or low calcium levels in the past?  B12 is low.. can cause memory issues... have her make a nurse office visit for B12 injection every 2 weeks x 2 followed by monthly.. 1 g per injection.  Normal thyroid and no sign of infection or anemia.  Recent consult visits:  06/20/21-Cardiology-Patient presented for follow up shortness of breath.Reviewed previous scans, ordered Lexiscan Myoview,follow up 6 months,no medication changes 04/22/21-Nephrology-Patient presented for follow up with decline in kidney function.Labs ordered(no data found)follow up 3 months 03/14/21-Orthopedic-Patient presented for left shoulder pain- no data found 03/10/21-Orthopedic-Patient presented for left elbow pain-no data found   Hospital visits:  03/18/21-[facility] presented for left elbow fracture,facial lacerations, xrays,scans, splint  applied, discharge with short course of Oxycodone for pain when needed. No Admission  Medications: Outpatient Encounter Medications as of 07/15/2021  Medication Sig   apixaban (ELIQUIS) 2.5 MG TABS tablet Take 1 tablet (2.5 mg total) by mouth 2 (two) times daily.   atorvastatin (LIPITOR) 20 MG tablet TAKE 1 TABLET BY MOUTH EVERY DAY   Blood Glucose Monitoring Suppl (ACCU-CHEK AVIVA PLUS) w/Device KIT Use to check blood once sugar daily.   carvedilol (COREG) 12.5 MG tablet TAKE 1 TABLET (12.5 MG TOTAL) BY MOUTH 2 (TWO) TIMES DAILY WITH A MEAL.    cetirizine (ZYRTEC) 10 MG tablet Take 10 mg by mouth at bedtime.   cholestyramine light (PREVALITE) 4 g packet USE 1 PACKET TWICE DAILY WITH A MEAL, AT LEAST 2 HOURS AWAY FROM OTHER MEDS  Patient needs an appointment for further refills.   citalopram (CELEXA) 20 MG tablet TAKE 1 TABLET BY MOUTH EVERY DAY   diltiazem (CARDIZEM CD) 180 MG 24 hr capsule TAKE 1 CAPSULE BY MOUTH EVERY DAY   ferrous sulfate 325 (65 FE) MG EC tablet Take 325 mg by mouth 2 (two) times daily.   glucose blood (ACCU-CHEK AVIVA PLUS) test strip Use to check blood sugar daily.   isosorbide mononitrate (IMDUR) 30 MG 24 hr tablet TAKE 1 TABLET BY MOUTH EVERY DAY   losartan (COZAAR) 100 MG tablet Take 1 tablet (100 mg total) by mouth daily.   omeprazole (PRILOSEC) 20 MG capsule TAKE 1 CAPSULE BY MOUTH EVERY DAY   Vitamin D, Ergocalciferol, (DRISDOL) 1.25 MG (50000 UNIT) CAPS capsule TAKE 1 CAPSULE (50,000 UNITS TOTAL) BY MOUTH EVERY 7 (SEVEN) DAYS   No facility-administered encounter medications on file as of 07/15/2021.     Recent Office Vitals: BP Readings from Last 3 Encounters:  06/20/21 130/82  03/28/21 122/80  03/18/21 117/67   Pulse Readings from Last 3 Encounters:  06/20/21 (!) 104  03/28/21 (!) 113  03/18/21 (!) 103    Wt Readings from Last 3 Encounters:  07/04/21 163 lb (73.9 kg)  06/20/21 163 lb (73.9 kg)  03/28/21 166 lb 8 oz (75.5 kg)     Kidney Function Lab Results  Component Value Date/Time   CREATININE 1.57 (H) 03/28/2021 10:18 AM   CREATININE 1.54 (H) 03/18/2021 10:50 AM   CREATININE 1.42 (H) 01/11/2015 08:30 AM  CREATININE 1.30 (H) 12/26/2014 08:30 AM   GFR 30.86 (L) 03/28/2021 10:18 AM   GFRNONAA 34 (L) 03/18/2021 10:50 AM   GFRNONAA 36 (L) 01/11/2015 08:30 AM   GFRAA 30 (L) 11/15/2017 08:39 AM   GFRAA 42 (L) 01/11/2015 08:30 AM    BMP Latest Ref Rng & Units 04/03/2021 03/28/2021 03/18/2021  Glucose 70 - 99 mg/dL - 136(H) 154(H)  BUN 6 - 23 mg/dL - 27(H) 32(H)  Creatinine 0.40 - 1.20  mg/dL - 1.57(H) 1.54(H)  BUN/Creat Ratio 12 - 28 - - -  Sodium 135 - 145 mEq/L - 142 136  Potassium 3.5 - 5.1 mEq/L - 4.0 5.0  Chloride 96 - 112 mEq/L - 107 107  CO2 19 - 32 mEq/L - 25 19(L)  Calcium 8.6 - 10.4 mg/dL 7.4(L) 7.4(L) 8.5(L)     Attempted contact with Tonya [name] 3 times on 07/17/21,07/18/21,07/21/21. Unsuccessful outreach. Will attempt contact next month.   Current antihypertensive regimen:  Hydrochlorothiazide 25 mg - 1 tablet daily PRN Losartan 100 mg - 1 tablet daily Carvedilol 12.5 mg - 1 tablet BID Diltiazem 180 mg - 1 capsule daily  Isosorbide Mononitrate 30 mg - 1 tablet daily   Adherence Review: Is the patient currently on ACE/ARB medication? Yes Does the patient have >5 day gap between last estimated fill dates? No   Star Rating Drugs:  Medication:  Last Fill: Day Supply Losartan 132m 04/14/21 90 Atorvastatin 232m8/26/22 90  Care Gaps: Annual wellness visit in last year? Yes Most Recent BP reading:130/82  104-P   06/20/21   If Diabetic: Most recent A1C reading: 6.1  03/18/21 Last eye exam / retinopathy screening: 09/12/20 Last diabetic foot exam: 01/09/21  PCP appointment on 09/05/21 and CCM appointment on 09/16/21  MiDebbora DusCPP notified  VeAvel SensorCCBelfryssistant [phone] have reviewed the care management and care coordination activities outlined in this encounter and I am certifying that I agree with the content of this note. No further action required.  MiDebbora DusPharmD Clinical Pharmacist [phone]

## 2021-07-22 ENCOUNTER — Encounter: Payer: Medicare Other | Admitting: Family Medicine

## 2021-08-10 ENCOUNTER — Other Ambulatory Visit: Payer: Self-pay | Admitting: Family Medicine

## 2021-08-10 NOTE — Telephone Encounter (Signed)
Last office visit 03/28/21 for memory loss.  Last refilled 05/20/21 for #12 with no refills.  Last Vit D level 03/28/21 normal at 31/15 ng/ml. . CPE scheduled for 09/05/21.

## 2021-08-11 NOTE — Progress Notes (Signed)
Subjective:   Tonya [name] is a 80 y.o. female who presents for Medicare Annual (Subsequent) preventive examination.  I connected with Tonya [name] today by telephone and verified that I am speaking with the correct person using two identifiers. Location patient: home Location provider: work Persons participating in the virtual visit: patient, Marine scientist.    I discussed the limitations, risks, security and privacy concerns of performing an evaluation and management service by telephone and the availability of in person appointments. I also discussed with the patient that there may be a patient responsible charge related to this service. The patient expressed understanding and verbally consented to this telephonic visit.    Interactive audio and video telecommunications were attempted between this provider and patient, however failed, due to patient having technical difficulties OR patient did not have access to video capability.  We continued and completed visit with audio only.  Some vital signs may be absent or patient reported.   Time Spent with patient on telephone encounter: 30 minutes  Review of Systems     Cardiac Risk Factors include: advanced age (>29mn, >>28women);diabetes mellitus     Objective:    Today's Vitals   08/14/21 1447  Weight: 165 lb (74.8 kg)  Height: 5' 6" (1.676 m)  PainSc: 0-No pain   Body mass index is 26.63 kg/m.  Advanced Directives 08/14/2021 03/17/2021 07/02/2020 07/03/2019 02/10/2017 05/28/2016 05/22/2016  Does Patient Have a Medical Advance Directive? No No Yes No No No No  Type of Advance Directive - -Public librarianLiving will - - - -  [place]? - - No - copy requested - - - -  Would patient like information on creating a medical advance directive? Yes (MAU/Ambulatory/Procedural Areas - Information given) - - Yes (MAU/Ambulatory/Procedural Areas - Information given) - No - patient declined  information No - patient declined information  Pre-existing out of facility DNR order (yellow form or pink MOST form) - - - - - - -    Current Medications (verified) Outpatient Encounter Medications as of 08/14/2021  Medication Sig   apixaban (ELIQUIS) 2.5 MG TABS tablet Take 1 tablet (2.5 mg total) by mouth 2 (two) times daily.   atorvastatin (LIPITOR) 20 MG tablet TAKE 1 TABLET BY MOUTH EVERY DAY   Blood Glucose Monitoring Suppl (ACCU-CHEK AVIVA PLUS) w/Device KIT Use to check blood once sugar daily.   carvedilol (COREG) 12.5 MG tablet TAKE 1 TABLET (12.5 MG TOTAL) BY MOUTH 2 (TWO) TIMES DAILY WITH A MEAL.   cetirizine (ZYRTEC) 10 MG tablet Take 10 mg by mouth at bedtime.   cholestyramine light (PREVALITE) 4 g packet USE 1 PACKET TWICE DAILY WITH A MEAL, AT LEAST 2 HOURS AWAY FROM OTHER MEDS  Patient needs an appointment for further refills.   citalopram (CELEXA) 20 MG tablet TAKE 1 TABLET BY MOUTH EVERY DAY   diltiazem (CARDIZEM CD) 180 MG 24 hr capsule TAKE 1 CAPSULE BY MOUTH EVERY DAY   ferrous sulfate 325 (65 FE) MG EC tablet Take 325 mg by mouth 2 (two) times daily.   glucose blood (ACCU-CHEK AVIVA PLUS) test strip Use to check blood sugar daily.   isosorbide mononitrate (IMDUR) 30 MG 24 hr tablet TAKE 1 TABLET BY MOUTH EVERY DAY   losartan (COZAAR) 100 MG tablet Take 1 tablet (100 mg total) by mouth daily.   omeprazole (PRILOSEC) 20 MG capsule TAKE 1 CAPSULE BY MOUTH EVERY DAY   Vitamin D, Ergocalciferol, (  DRISDOL) 1.25 MG (50000 UNIT) CAPS capsule TAKE 1 CAPSULE (50,000 UNITS TOTAL) BY MOUTH EVERY 7 (SEVEN) DAYS   [DISCONTINUED] Vitamin D, Ergocalciferol, (DRISDOL) 1.25 MG (50000 UNIT) CAPS capsule TAKE 1 CAPSULE (50,000 UNITS TOTAL) BY MOUTH EVERY 7 (SEVEN) DAYS   No facility-administered encounter medications on file as of 08/14/2021.    Allergies (verified) Amoxicillin-pot clavulanate   History: Past Medical History:  Diagnosis Date   Adenocarcinoma, colon ([place])    Anemia     iron deficiency   Anxiety    Carpal tunnel syndrome    Cataract    REMOVED BILATERAL   Chronic diarrhea    Colon polyps    Tubular adenoma   Degenerative disk disease    Depression    Diabetes mellitus, type 2 ([place])    Dysrhythmia    h/o AFIB   GERD (gastroesophageal reflux disease)    History of blood transfusion    After surgeries   Hyperlipidemia    Hypertension    IBS (irritable bowel syndrome)    Kidney disorder    Osteoarthritis    Osteoporosis    PONV (postoperative nausea and vomiting)    S/P coronary artery stent placement 2007   Past Surgical History:  Procedure Laterality Date   CARDIAC CATHETERIZATION  12/10   LAD stent patent. Insignificant CAD, otherwise EF 60-65%   CARPAL TUNNEL RELEASE     CATARACT EXTRACTION  03/2015   CHOLECYSTECTOMY     COLON SURGERY     COLONOSCOPY     DILATION AND CURETTAGE OF UTERUS     HEMATOMA EVACUATION     after knee replacement   KNEE ARTHROSCOPY     bilateral   LUMBAR LAMINECTOMY N/A 11/26/2015   Procedure: LUMBAR DECOMPRESSIVE LAMINECTOMY L3-4 AND L4-5, LEFT L2-3 HEMILAMINECTOMY;  Surgeon: Jessy Oto, MD;  Location: [facility];  Service: Orthopedics;  Laterality: N/A;   ORIF ELBOW FRACTURE Left 03/18/2021   Procedure: OPEN REDUCTION INTERNAL FIXATION (ORIF) ELBOW/OLECRANON FRACTURE;  Surgeon: Renette Butters, MD;  Location: [facility];  Service: Orthopedics;  Laterality: Left;   PARTIAL COLECTOMY  11/2008   right, for adenocarcinoma   REPLACEMENT TOTAL KNEE  04/320   right, complicated by hemarthrosis    REVERSE SHOULDER ARTHROPLASTY Right 05/28/2016   REVERSE SHOULDER ARTHROPLASTY Right 05/28/2016   Procedure: REVERSE SHOULDER ARTHROPLASTY;  Surgeon: Meredith Pel, MD;  Location: [facility];  Service: Orthopedics;  Laterality: Right;   TONSILLECTOMY     TOTAL KNEE ARTHROPLASTY Left 04/03/2014   dr whitfield   TOTAL KNEE ARTHROPLASTY Left 04/03/2014   Procedure: TOTAL KNEE ARTHROPLASTY;  Surgeon: Garald Balding, MD;   Location: [facility];  Service: Orthopedics;  Laterality: Left;   TOTAL KNEE REVISION  03/08/2012   Procedure: TOTAL KNEE REVISION;  Surgeon: Garald Balding, MD;  Location: [facility];  Service: Orthopedics;  Laterality: Right;  removal total knee hardware and placement of antibiotic cement spacer and antibiotic beads   TOTAL KNEE REVISION  05/17/2012   Procedure: TOTAL KNEE REVISION;  Surgeon: Garald Balding, MD;  Location: [facility];  Service: Orthopedics;  Laterality: Right;  right total knee revision, removal of antibiotic spacer   Family History  Problem Relation Age of Onset   Heart disease Mother        massive MI age 52   Heart attack Mother    Heart disease Father        Massive MI age 60   Prostate cancer Father  Diabetes Paternal Grandmother    Colon polyps Sister    Colon cancer Neg Hx    Social History   Socioeconomic History   Marital status: Divorced    Spouse name: Not on file   Number of children: 3   Years of education: Not on file   Highest education level: Not on file  Occupational History   Occupation: retired    Fish farm manager: RETIRED  Tobacco Use   Smoking status: Never   Smokeless tobacco: Never  Vaping Use   Vaping Use: Never used  Substance and Sexual Activity   Alcohol use: No    Alcohol/week: 0.0 standard drinks   Drug use: No   Sexual activity: Never  Other Topics Concern   Not on file  Social History Narrative   No regular exercise, limited due to knees   Diet- addicted to sweets, some fruit and veggies, water daily.   Social Determinants of Health   Financial Resource Strain: Low Risk    Difficulty of Paying Living Expenses: Not hard at all  Food Insecurity: No Food Insecurity   Worried About Charity fundraiser in the Last Year: Never true   [place] in the Last Year: Never true  Transportation Needs: No Transportation Needs   Lack of Transportation (Medical): No   Lack of Transportation (Non-Medical): No  Physical Activity: Inactive    Days of Exercise per Week: 0 days   Minutes of Exercise per Session: 0 min  Stress: No Stress Concern Present   Feeling of Stress : Not at all  Social Connections: Moderately Isolated   Frequency of Communication with Friends and Family: Once a week   Frequency of Social Gatherings with Friends and Family: Twice a week   Attends Religious Services: More than 4 times per year   Active Member of [employer] or Organizations: No   Attends Archivist Meetings: Never   Marital Status: Widowed    Tobacco Counseling Counseling given: Not Answered   Clinical Intake:  Pre-visit preparation completed: Yes  Pain : No/denies pain Pain Score: 0-No pain     BMI - recorded: 26.63 Nutritional Status: BMI 25 -29 Overweight Nutritional Risks: None Diabetes: Yes CBG done?: No (visit completed over the phone) Did pt. bring in CBG monitor from home?: No  How often do you need to have someone help you when you read instructions, pamphlets, or other written materials from your doctor or pharmacy?: 1 - Never  Diabetes:  Is the patient diabetic?  Yes  If diabetic, was a CBG obtained today?  No , visit completed over the phone. Did the patient bring in their glucometer from home?  No , visit completed over the phone. How often do you monitor your CBG's? Patient states that blood sugars are not being monitored.   Financial Strains and Diabetes Management:  Are you having any financial strains with the device, your supplies or your medication? No .  Does the patient want to be seen by Chronic Care Management for management of their diabetes?  No  Would the patient like to be referred to a Nutritionist or for Diabetic Management?  No   Diabetic Exams:  Diabetic Eye Exam: Due, last exam 09/12/20 for diabetic eye exam. Pt has been advised about the importance in completing this exam.   Diabetic Foot Exam: Completed 01/09/21.     Interpreter Needed?: No  Information entered by :: Orrin Brigham LPN   Activities of Daily Living In your  present state of health, do you have any difficulty performing the following activities: 08/14/2021 03/17/2021  Hearing? N N  Vision? N N  Difficulty concentrating or making decisions? N -  Walking or climbing stairs? N N  Dressing or bathing? N N  Doing errands, shopping? Y N  Comment patient states daughter drives to appointments -  Preparing Food and eating ? N -  Using the Toilet? N -  In the past six months, have you accidently leaked urine? N -  Do you have problems with loss of bowel control? N -  Managing your Medications? N -  Managing your Finances? Y -  Comment daughter manages -  Housekeeping or managing your Housekeeping? N -  Some recent data might be hidden    Patient Care Team: [name], MD as PCP - General [name], [facility] as Pharmacist (Pharmacist)  Indicate any recent Medical Services you may have received from other than [facility] providers in the past year (date may be approximate).     Assessment:   This is a routine wellness examination for Tonya [name].  Hearing/Vision screen Hearing Screening - Comments:: No issue Vision Screening - Comments:: Last exam 2021, has an up coming appointment next month,  Dietary issues and exercise activities discussed: Current Exercise Habits: The patient does not participate in regular exercise at present   Goals Addressed             This Visit's Progress    Patient Stated       Would like to be able to visit family       Depression Screen PHQ 2/9 Scores 08/14/2021 07/11/2020 07/02/2020 07/03/2019 03/01/2018 02/26/2017 02/10/2017  PHQ - 2 Score 0 0 0 0 0 0 0  PHQ- 9 Score - 0 0 0 - - -    Fall Risk Fall Risk  08/14/2021 07/11/2020 07/02/2020 07/03/2019 03/01/2018  Falls in the past year? 1 0 0 0 Yes  Comment - - - - -  Number falls in past yr: 0 0 0 - 1  Injury with Fall? 1 0 0 - -  Comment broke left elbow - - - -  Risk Factor Category  - - - - -  Risk for  fall due to : Other (Comment) - Medication side effect Medication side effect -  Risk for fall due to: Comment fell over dog - - - -  Follow up Falls prevention discussed - Falls evaluation completed;Falls prevention discussed Falls evaluation completed;Falls prevention discussed -    FALL RISK PREVENTION PERTAINING TO THE HOME:  Any stairs in or around the home? Yes  If so, are there any without handrails? No  Home free of loose throw rugs in walkways, pet beds, electrical cords, etc? No  Adequate lighting in your home to reduce risk of falls? Yes   ASSISTIVE DEVICES UTILIZED TO PREVENT FALLS:  Life alert? No  Use of a cane, walker or w/c? No  Grab bars in the bathroom? Yes  Shower chair or bench in shower? Yes  Elevated toilet seat or a handicapped toilet? No   TIMED UP AND GO:  Was the test performed? No , visit completed over the phone.   Cognitive Function: Normal cognitive status assessed by this Nurse Health Advisor. No abnormalities found.   MMSE - Mini Mental State Exam 07/02/2020 07/03/2019 02/10/2017  Orientation to time _0 Orientation to Place _1 Registration _2 Attention/ Calculation 5 5 0  Recall _0 Language- name 2 objects - - 0  Language- repeat _1 Language- follow 3 step command - - 3  Language- read & follow direction - - 0  Write a sentence - - 0  Copy design - - 0  Total score - - 20        Immunizations Immunization History  Administered Date(s) Administered   Influenza Split 06/29/2011, 07/08/2011, 06/17/2013   Influenza Whole 07/01/2007, 06/18/2008, 07/12/2009, 07/17/2010   Influenza, High Dose Seasonal PF 06/25/2014, 06/10/2015, 06/06/2016, 05/26/2017, 05/28/2018, 05/21/2019, 05/16/2020, 06/08/2021   Moderna Sars-Covid-2 Vaccination 01/25/2020, 02/22/2020   PFIZER Comirnaty(Gray Top)Covid-19 Tri-Sucrose Vaccine 02/14/2021   PFIZER(Purple Top)SARS-COV-2 Vaccination 09/08/2020   Pneumococcal Conjugate-13 10/26/2014    Pneumococcal Polysaccharide-23 09/29/2003, 05/20/2012   Td 07/12/2009    TDAP status: Due, Education has been provided regarding the importance of this vaccine. Advised may receive this vaccine at local pharmacy or Health Dept. Aware to provide a copy of the vaccination record if obtained from local pharmacy or Health Dept. Verbalized acceptance and understanding.  Flu Vaccine status: Up to date  Pneumococcal vaccine status: Up to date  Covid-19 vaccine status: Declined, Education has been provided regarding the importance of this vaccine but patient still declined. Advised may receive this vaccine at local pharmacy or Health Dept.or vaccine clinic. Aware to provide a copy of the vaccination record if obtained from local pharmacy or Health Dept. Verbalized acceptance and understanding.  Qualifies for Shingles Vaccine? Yes   Zostavax completed No   Shingrix Completed?: No.    Education has been provided regarding the importance of this vaccine. Patient has been advised to call insurance company to determine out of pocket expense if they have not yet received this vaccine. Advised may also receive vaccine at local pharmacy or Health Dept. Verbalized acceptance and understanding.  Screening Tests Health Maintenance  Topic Date Due   Zoster Vaccines- Shingrix (1 of 2) Never done   DEXA SCAN  11/02/2019   MAMMOGRAM  11/09/2020   COVID-19 Vaccine (5 - Booster) 04/11/2021   COLONOSCOPY (Pts 45-63yr Insurance coverage will need to be confirmed)  07/19/2021   TETANUS/TDAP  07/02/2024 (Originally 07/13/2019)   OPHTHALMOLOGY EXAM  09/12/2021   HEMOGLOBIN A1C  09/17/2021   FOOT EXAM  01/09/2022   Pneumonia Vaccine 80 Years old  Completed   INFLUENZA VACCINE  Completed   HPV VACCINES  Aged Out    Health Maintenance  Health Maintenance Due  Topic Date Due   Zoster Vaccines- Shingrix (1 of 2) Never done   DEXA SCAN  11/02/2019   MAMMOGRAM  11/09/2020   COVID-19 Vaccine (5 - Booster)  04/11/2021   COLONOSCOPY (Pts 45-431yrInsurance coverage will need to be confirmed)  07/19/2021    Colorectal cancer screening: No longer required.   Mammogram Status: Due, Patient plans on discussing with PCP at next visit.  Bone Density Status:Due, Patient plans on discussing with PCP at next visit.   Lung Cancer Screening: (Low Dose CT Chest recommended if Age 237-80ears, 30 pack-year currently smoking OR have quit w/in 15years.) does not qualify.     Additional Screening:  Hepatitis C Screening: does not qualify;   Vision Screening: Recommended annual ophthalmology exams for early detection of glaucoma and other disorders of the eye. Is the patient up to date with their annual eye exam?  No , patient has up coming appointment Who is the provider or what is the name of the office in which  the patient attends annual eye exams? Patient unsure of provider information   Dental Screening: Recommended annual dental exams for proper oral hygiene  Community Resource Referral / Chronic Care Management: CRR required this visit?  No   CCM required this visit?  No      Plan:     I have personally reviewed and noted the following in the patient's chart:   Medical and social history Use of alcohol, tobacco or illicit drugs  Current medications and supplements including opioid prescriptions.  Functional ability and status Nutritional status Physical activity Advanced directives List of other physicians Hospitalizations, surgeries, and ER visits in previous 12 months Vitals Screenings to include cognitive, depression, and falls Referrals and appointments  In addition, I have reviewed and discussed with patient certain preventive protocols, quality metrics, and best practice recommendations. A written personalized care plan for preventive services as well as general preventive health recommendations were provided to patient.   Due to this being a telephonic visit, the after  visit summary with patients personalized plan was offered to patient via mail or my-chart. Patient would like to access on my-chart.   Loma Messing, LPN  [id number]   Nurse Health Advisor  Nurse Notes: None

## 2021-08-13 DIAGNOSIS — E1122 Type 2 diabetes mellitus with diabetic chronic kidney disease: Secondary | ICD-10-CM | POA: Diagnosis not present

## 2021-08-13 DIAGNOSIS — N1832 Chronic kidney disease, stage 3b: Secondary | ICD-10-CM | POA: Diagnosis not present

## 2021-08-13 DIAGNOSIS — I10 Essential (primary) hypertension: Secondary | ICD-10-CM | POA: Diagnosis not present

## 2021-08-13 DIAGNOSIS — N2581 Secondary hyperparathyroidism of renal origin: Secondary | ICD-10-CM | POA: Diagnosis not present

## 2021-08-14 ENCOUNTER — Telehealth: Payer: Self-pay

## 2021-08-14 ENCOUNTER — Ambulatory Visit (INDEPENDENT_AMBULATORY_CARE_PROVIDER_SITE_OTHER): Payer: Medicare Other

## 2021-08-14 VITALS — Ht 66.0 in | Wt 165.0 lb

## 2021-08-14 DIAGNOSIS — Z0000 Encounter for general adult medical examination without abnormal findings: Secondary | ICD-10-CM | POA: Diagnosis not present

## 2021-08-14 NOTE — Patient Instructions (Signed)
Tonya [name] , Thank you for taking time to complete your Medicare Wellness Visit. I appreciate your ongoing commitment to your health goals. Please review the following plan we discussed and let me know if I can assist you in the future.   Screening recommendations/referrals: Colonoscopy: no longer required Mammogram: due, last completed 11/10/19, Plan to discuss with PCP at your next appointment Bone Density: due, last completed 11/01/14, Plan to discuss with PCP at your next appointment Recommended yearly ophthalmology/optometry visit for glaucoma screening and checkup Recommended yearly dental visit for hygiene and checkup  Vaccinations: Influenza vaccine: up to date Pneumococcal vaccine: up to date Tdap vaccine: Due-May obtain vaccine at your local pharmacy. Shingles vaccine: Discuss with your local pharmacy if you change your mind Covid-19:newest booster available at your local pharmacy  Advanced directives: information available at your next visit  Conditions/risks identified: see problem list   Next appointment: Follow up in one year for your annual wellness visit 08/18/22 @ 9:30am, this will be a telephone visit   Preventive Care 81 Years and Older, Female Preventive care refers to lifestyle choices and visits with your health care provider that can promote health and wellness. What does preventive care include? A yearly physical exam. This is also called an annual well check. Dental exams once or twice a year. Routine eye exams. Ask your health care provider how often you should have your eyes checked. Personal lifestyle choices, including: Daily care of your teeth and gums. Regular physical activity. Eating a healthy diet. Avoiding tobacco and drug use. Limiting alcohol use. Practicing safe sex. Taking low-dose aspirin every day. Taking vitamin and mineral supplements as recommended by your health care provider. What happens during an annual well check? The services and  screenings done by your health care provider during your annual well check will depend on your age, overall health, lifestyle risk factors, and family history of disease. Counseling  Your health care provider may ask you questions about your: Alcohol use. Tobacco use. Drug use. Emotional well-being. Home and relationship well-being. Sexual activity. Eating habits. History of falls. Memory and ability to understand (cognition). Work and work Statistician. Reproductive health. Screening  You may have the following tests or measurements: Height, weight, and BMI. Blood pressure. Lipid and cholesterol levels. These may be checked every 5 years, or more frequently if you are over 72 years old. Skin check. Lung cancer screening. You may have this screening every year starting at age 81 if you have a 30-pack-year history of smoking and currently smoke or have quit within the past 15 years. Fecal occult blood test (FOBT) of the stool. You may have this test every year starting at age 81. Flexible sigmoidoscopy or colonoscopy. You may have a sigmoidoscopy every 5 years or a colonoscopy every 10 years starting at age 81. Hepatitis C blood test. Hepatitis B blood test. Sexually transmitted disease (STD) testing. Diabetes screening. This is done by checking your blood sugar (glucose) after you have not eaten for a while (fasting). You may have this done every 1-3 years. Bone density scan. This is done to screen for osteoporosis. You may have this done starting at age 81. Mammogram. This may be done every 1-2 years. Talk to your health care provider about how often you should have regular mammograms. Talk with your health care provider about your test results, treatment options, and if necessary, the need for more tests. Vaccines  Your health care provider may recommend certain vaccines, such as: Influenza vaccine. This is  recommended every year. Tetanus, diphtheria, and acellular pertussis (Tdap,  Td) vaccine. You may need a Td booster every 10 years. Zoster vaccine. You may need this after age 31. Pneumococcal 13-valent conjugate (PCV13) vaccine. One dose is recommended after age 81. Pneumococcal polysaccharide (PPSV23) vaccine. One dose is recommended after age 81. Talk to your health care provider about which screenings and vaccines you need and how often you need them. This information is not intended to replace advice given to you by your health care provider. Make sure you discuss any questions you have with your health care provider. Document Released: 10/11/2015 Document Revised: 06/03/2016 Document Reviewed: 07/16/2015 Elsevier Interactive Patient Education  2017 [place] Prevention in the Home Falls can cause injuries. They can happen to people of all ages. There are many things you can do to make your home safe and to help prevent falls. What can I do on the outside of my home? Regularly fix the edges of walkways and driveways and fix any cracks. Remove anything that might make you trip as you walk through a door, such as a raised step or threshold. Trim any bushes or trees on the path to your home. Use bright outdoor lighting. Clear any walking paths of anything that might make someone trip, such as rocks or tools. Regularly check to see if handrails are loose or broken. Make sure that both sides of any steps have handrails. Any raised decks and porches should have guardrails on the edges. Have any leaves, snow, or ice cleared regularly. Use sand or salt on walking paths during winter. Clean up any spills in your garage right away. This includes oil or grease spills. What can I do in the bathroom? Use night lights. Install grab bars by the toilet and in the tub and shower. Do not use towel bars as grab bars. Use non-skid mats or decals in the tub or shower. If you need to sit down in the shower, use a plastic, non-slip stool. Keep the floor dry. Clean up any  water that spills on the floor as soon as it happens. Remove soap buildup in the tub or shower regularly. Attach bath mats securely with double-sided non-slip rug tape. Do not have throw rugs and other things on the floor that can make you trip. What can I do in the bedroom? Use night lights. Make sure that you have a light by your bed that is easy to reach. Do not use any sheets or blankets that are too big for your bed. They should not hang down onto the floor. Have a firm chair that has side arms. You can use this for support while you get dressed. Do not have throw rugs and other things on the floor that can make you trip. What can I do in the kitchen? Clean up any spills right away. Avoid walking on wet floors. Keep items that you use a lot in easy-to-reach places. If you need to reach something above you, use a strong step stool that has a grab bar. Keep electrical cords out of the way. Do not use floor polish or wax that makes floors slippery. If you must use wax, use non-skid floor wax. Do not have throw rugs and other things on the floor that can make you trip. What can I do with my stairs? Do not leave any items on the stairs. Make sure that there are handrails on both sides of the stairs and use them. Fix handrails that are  broken or loose. Make sure that handrails are as long as the stairways. Check any carpeting to make sure that it is firmly attached to the stairs. Fix any carpet that is loose or worn. Avoid having throw rugs at the top or bottom of the stairs. If you do have throw rugs, attach them to the floor with carpet tape. Make sure that you have a light switch at the top of the stairs and the bottom of the stairs. If you do not have them, ask someone to add them for you. What else can I do to help prevent falls? Wear shoes that: Do not have high heels. Have rubber bottoms. Are comfortable and fit you well. Are closed at the toe. Do not wear sandals. If you use a  stepladder: Make sure that it is fully opened. Do not climb a closed stepladder. Make sure that both sides of the stepladder are locked into place. Ask someone to hold it for you, if possible. Clearly mark and make sure that you can see: Any grab bars or handrails. First and last steps. Where the edge of each step is. Use tools that help you move around (mobility aids) if they are needed. These include: Canes. Walkers. Scooters. Crutches. Turn on the lights when you go into a dark area. Replace any light bulbs as soon as they burn out. Set up your furniture so you have a clear path. Avoid moving your furniture around. If any of your floors are uneven, fix them. If there are any pets around you, be aware of where they are. Review your medicines with your doctor. Some medicines can make you feel dizzy. This can increase your chance of falling. Ask your doctor what other things that you can do to help prevent falls. This information is not intended to replace advice given to you by your health care provider. Make sure you discuss any questions you have with your health care provider. Document Released: 07/11/2009 Document Revised: 02/20/2016 Document Reviewed: 10/19/2014 Elsevier Interactive Patient Education  2017 [employer].

## 2021-08-14 NOTE — Progress Notes (Signed)
Entered in error

## 2021-08-16 ENCOUNTER — Other Ambulatory Visit: Payer: Self-pay | Admitting: Family Medicine

## 2021-08-16 DIAGNOSIS — I4821 Permanent atrial fibrillation: Secondary | ICD-10-CM

## 2021-08-18 ENCOUNTER — Ambulatory Visit: Payer: Medicare Other

## 2021-08-25 ENCOUNTER — Ambulatory Visit: Payer: Medicare Other | Admitting: Cardiology

## 2021-09-05 ENCOUNTER — Ambulatory Visit (INDEPENDENT_AMBULATORY_CARE_PROVIDER_SITE_OTHER): Payer: Medicare Other | Admitting: Family Medicine

## 2021-09-05 ENCOUNTER — Other Ambulatory Visit: Payer: Self-pay

## 2021-09-05 ENCOUNTER — Encounter: Payer: Self-pay | Admitting: Family Medicine

## 2021-09-05 VITALS — BP 126/78 | HR 97 | Temp 97.4°F | Ht 65.0 in | Wt 161.2 lb

## 2021-09-05 DIAGNOSIS — F331 Major depressive disorder, recurrent, moderate: Secondary | ICD-10-CM | POA: Diagnosis not present

## 2021-09-05 DIAGNOSIS — E2839 Other primary ovarian failure: Secondary | ICD-10-CM

## 2021-09-05 DIAGNOSIS — I671 Cerebral aneurysm, nonruptured: Secondary | ICD-10-CM | POA: Diagnosis not present

## 2021-09-05 DIAGNOSIS — N2581 Secondary hyperparathyroidism of renal origin: Secondary | ICD-10-CM | POA: Diagnosis not present

## 2021-09-05 DIAGNOSIS — E1122 Type 2 diabetes mellitus with diabetic chronic kidney disease: Secondary | ICD-10-CM | POA: Diagnosis not present

## 2021-09-05 DIAGNOSIS — Z0000 Encounter for general adult medical examination without abnormal findings: Secondary | ICD-10-CM | POA: Diagnosis not present

## 2021-09-05 DIAGNOSIS — I482 Chronic atrial fibrillation, unspecified: Secondary | ICD-10-CM

## 2021-09-05 DIAGNOSIS — E785 Hyperlipidemia, unspecified: Secondary | ICD-10-CM

## 2021-09-05 DIAGNOSIS — E538 Deficiency of other specified B group vitamins: Secondary | ICD-10-CM

## 2021-09-05 DIAGNOSIS — E1169 Type 2 diabetes mellitus with other specified complication: Secondary | ICD-10-CM | POA: Diagnosis not present

## 2021-09-05 DIAGNOSIS — E1121 Type 2 diabetes mellitus with diabetic nephropathy: Secondary | ICD-10-CM

## 2021-09-05 DIAGNOSIS — N184 Chronic kidney disease, stage 4 (severe): Secondary | ICD-10-CM

## 2021-09-05 DIAGNOSIS — F0150 Vascular dementia without behavioral disturbance: Secondary | ICD-10-CM

## 2021-09-05 LAB — COMPREHENSIVE METABOLIC PANEL
ALT: 19 U/L (ref 0–35)
AST: 23 U/L (ref 0–37)
Albumin: 4.2 g/dL (ref 3.5–5.2)
Alkaline Phosphatase: 72 U/L (ref 39–117)
BUN: 28 mg/dL — ABNORMAL HIGH (ref 6–23)
CO2: 26 mEq/L (ref 19–32)
Calcium: 9.2 mg/dL (ref 8.4–10.5)
Chloride: 107 mEq/L (ref 96–112)
Creatinine, Ser: 1.56 mg/dL — ABNORMAL HIGH (ref 0.40–1.20)
GFR: 31 mL/min — ABNORMAL LOW (ref >60.00)
Glucose, Bld: 135 mg/dL — ABNORMAL HIGH (ref 70–99)
Potassium: 4.7 mEq/L (ref 3.5–5.1)
Sodium: 141 mEq/L (ref 135–145)
Total Bilirubin: 1.1 mg/dL (ref 0.2–1.2)
Total Protein: 6.8 g/dL (ref 6.0–8.3)

## 2021-09-05 LAB — LIPID PANEL
Cholesterol: 110 mg/dL (ref 0–200)
HDL: 42.3 mg/dL (ref >39.00)
LDL Cholesterol: 35 mg/dL (ref 0–99)
NonHDL: 67.85
Total CHOL/HDL Ratio: 3
Triglycerides: 165 mg/dL — ABNORMAL HIGH (ref 0.0–149.0)
VLDL: 33 mg/dL (ref 0.0–40.0)

## 2021-09-05 LAB — HEMOGLOBIN A1C: Hgb A1c MFr Bld: 6.6 % — ABNORMAL HIGH (ref 4.6–6.5)

## 2021-09-05 LAB — VITAMIN B12: Vitamin B-12: 217 pg/mL (ref 211–911)

## 2021-09-05 MED ORDER — CYANOCOBALAMIN 1000 MCG/ML IJ SOLN
1000.0000 ug | Freq: Once | INTRAMUSCULAR | Status: AC
Start: 2021-09-05 — End: 2021-09-05
  Administered 2021-09-05: 1000 ug via INTRAMUSCULAR

## 2021-09-05 MED ORDER — CITALOPRAM HYDROBROMIDE 40 MG PO TABS: 40.0000 mg | ORAL_TABLET | Freq: Every day | ORAL | 5 refills | Status: DC

## 2021-09-05 NOTE — Addendum Note (Signed)
Addended by: Carter Kitten on: 09/05/2021 10:53 AM   Modules accepted: Orders

## 2021-09-05 NOTE — Patient Instructions (Addendum)
Restart monthly B12 injections.  Increase celexa to 40 mg daily.   Call Dr. Ardis Hughs to set up repeat colonoscopy... due 06/2021 [facility]  [phone]    Please call the location of your choice from the menu below to schedule your Mammogram and/or Bone Density appointment.    [facility]                      Phone:  [phone], [place] [address]                                                             Services: Traditional and 3D Mammogram, [facility]                 Phone: [phone] [address], [place] [address]    Service: Bone Density ONLY   *this site does NOT perform mammograms  [facility]                        Phone:  [phone] [address]                                  [place], [place] [address]                                            Services:  3D Mammogram and Bone Density

## 2021-09-05 NOTE — Assessment & Plan Note (Signed)
Due for re-eval. 

## 2021-09-05 NOTE — Assessment & Plan Note (Signed)
Stable, chronic.  Continue current medication.    celexa 20 mg daily

## 2021-09-05 NOTE — Progress Notes (Signed)
Patient ID: Tonya [name], female    DOB: [birth year], 80 y.o.   MRN: [id number]  This visit was conducted in person.  BP 126/78   Pulse 97   Temp (!) 97.4 F (36.3 C) (Temporal)   Ht $R'5\' 5"'HU$  (1.651 m)   Wt 161 lb 4 oz (73.1 kg)   SpO2 95%   BMI 26.83 kg/m    CC:  Chief Complaint  Patient presents with   Annual Exam    Part 2    Subjective:   HPI: Tonya [name] is a 80 y.o. female presenting on 09/05/2021 for Annual Exam (Part 2)  The patient presents for annual medicare wellness, complete physical and review of chronic health problems. He/She also has the following acute concerns today: in last 2 weeks she has been having pain in right shoulder. Pain anteriorly with  abduction and internal rotation.  No recent falls.  The patient saw a LPN or RN for medicare wellness visit.  Prevention and wellness was reviewed in detail. Note reviewed and important notes copied below.   03/18/2021 ORIF olecranon fracture. Dr. Percell Miller  Diabetes:   previously diet controlled, Due for re-eval. Using medications without difficulties: Hypoglycemic episodes: Hyperglycemic episodes: Feet problems: Blood Sugars averaging: eye exam within last year:  Hypertension:    Good control on current regimen Current meds listed: Hydrochlorothiazide 25 mg - 1 tablet daily prn Losartan 100 mg - 1 tablet daily Carvedilol 12.5 mg - 1 tablet BID Diltiazem 180 mg - 1 capsule daily  Isosorbide Mononitrate 30 mg - 1 tablet daily  BP Readings from Last 3 Encounters:  09/05/21 126/78  06/20/21 130/82  03/28/21 122/80  Using medication without problems or lightheadedness: none Chest pain with exertion:none Edema:none Short of breath: none Average home BPs: Other issues: Atrial fibrillation: Followed by cardiology, on Eliquis and rate controlled.  Dr. Percival Spanish.  Elevated Cholesterol: Due for re-eval on atorvastatin Using medications without problems: Muscle aches:  Diet  compliance: Exercise: Other complaints:   MDD, GAD:  Inadequte control on celexa 20 mg daily HQ9:13   CKD, secondary hyperparathyroidism.:  Followed by Dr. Candiss Norse  Ischemic vascular dementia per MRI:  Stable per patient, somewhat worse in last 6 months MRI angio 04/23/2021 also showed  1. 1.2 cm saccular aneurysm arising from the proximal cavernous right ICA. This is directed medially and slightly posteriorly, and corresponds with the abnormality on prior brain MRI. 2. Otherwise unremarkable intracranial MRA. Recommended referral to vascular MD to follow.  Patient Care Team: [name], MD as PCP - General [name], [facility] as Pharmacist (Pharmacist)      Relevant past medical, surgical, family and social history reviewed and updated as indicated. Interim medical history since our last visit reviewed. Allergies and medications reviewed and updated. Outpatient Medications Prior to Visit  Medication Sig Dispense Refill   apixaban (ELIQUIS) 2.5 MG TABS tablet Take 1 tablet (2.5 mg total) by mouth 2 (two) times daily. 60 tablet 11   atorvastatin (LIPITOR) 20 MG tablet TAKE 1 TABLET BY MOUTH EVERY DAY 90 tablet 1   Blood Glucose Monitoring Suppl (ACCU-CHEK AVIVA PLUS) w/Device KIT Use to check blood once sugar daily. 1 kit 0   carvedilol (COREG) 12.5 MG tablet TAKE 1 TABLET (12.5 MG TOTAL) BY MOUTH 2 (TWO) TIMES DAILY WITH A MEAL. 180 tablet 0   cetirizine (ZYRTEC) 10 MG tablet Take 10 mg by mouth at bedtime.     cholestyramine light (PREVALITE) 4 g packet USE  1 PACKET TWICE DAILY WITH A MEAL, AT LEAST 2 HOURS AWAY FROM OTHER MEDS  Patient needs an appointment for further refills. 540 packet 0   citalopram (CELEXA) 20 MG tablet TAKE 1 TABLET BY MOUTH EVERY DAY 90 tablet 0   diltiazem (CARDIZEM CD) 180 MG 24 hr capsule TAKE 1 CAPSULE BY MOUTH EVERY DAY 90 capsule 0   ferrous sulfate 325 (65 FE) MG EC tablet Take 325 mg by mouth 2 (two) times daily.     glucose blood (ACCU-CHEK  AVIVA PLUS) test strip Use to check blood sugar daily. 100 each 3   isosorbide mononitrate (IMDUR) 30 MG 24 hr tablet TAKE 1 TABLET BY MOUTH EVERY DAY 90 tablet 2   losartan (COZAAR) 100 MG tablet Take 1 tablet (100 mg total) by mouth daily. 90 tablet 3   omeprazole (PRILOSEC) 20 MG capsule TAKE 1 CAPSULE BY MOUTH EVERY DAY 90 capsule 3   Vitamin D, Ergocalciferol, (DRISDOL) 1.25 MG (50000 UNIT) CAPS capsule TAKE 1 CAPSULE (50,000 UNITS TOTAL) BY MOUTH EVERY 7 (SEVEN) DAYS 12 capsule 0   No facility-administered medications prior to visit.     Per HPI unless specifically indicated in ROS section below Review of Systems Objective:  BP 126/78   Pulse 97   Temp (!) 97.4 F (36.3 C) (Temporal)   Ht $R'5\' 5"'RO$  (1.651 m)   Wt 161 lb 4 oz (73.1 kg)   SpO2 95%   BMI 26.83 kg/m   Wt Readings from Last 3 Encounters:  09/05/21 161 lb 4 oz (73.1 kg)  08/14/21 165 lb (74.8 kg)  07/04/21 163 lb (73.9 kg)      Physical Exam    Results for orders placed or performed during the hospital encounter of 07/04/21  MYOCARDIAL PERFUSION IMAGING  Result Value Ref Range   Rest Nuclear Isotope Dose 10.9 mCi   Stress Nuclear Isotope Dose 31.9 mCi   Rest HR 82.0 bpm   Rest BP 150/96 mmHg   Peak HR 100 bpm   Peak BP 158/69 mmHg   SSS 1.0    SRS 0.0    SDS 1.0    TID 0.93    ST Depression (mm) 0 mm    This visit occurred during the SARS-CoV-2 public health emergency.  Safety protocols were in place, including screening questions prior to the visit, additional usage of staff PPE, and extensive cleaning of exam room while observing appropriate contact time as indicated for disinfecting solutions.   COVID 19 screen:  No recent travel or known exposure to COVID19 The patient denies respiratory symptoms of COVID 19 at this time. The importance of social distancing was discussed today.   Assessment and Plan The patient's preventative maintenance and recommended screening tests for an annual wellness exam  were reviewed in full today. Brought up to date unless services declined.  Counselled on the importance of diet, exercise, and its role in overall health and mortality. The patient's FH and SH was reviewed, including their home life, tobacco status, and drug and alcohol status.    DEXA 10/2014 normal.  Repeat in 5 years,   Mammogram nml in 10/2019, she would like to continue these. Uptodate with colonoscopy with hx of colon cancer.. 06/2018 with Dr. Ardis Hughs repeat in 3 years. PAP/DVE not indicated at this age. No vaginal bleeding.   Nonsmoker   Vaccine: S/P Moderna COVID vaccine x 4 and flu given 05/2021, uptodate except for shingles    [name], MD

## 2021-09-05 NOTE — Assessment & Plan Note (Signed)
Followed by cardiology, on Eliquis and rate controlled.

## 2021-09-08 ENCOUNTER — Other Ambulatory Visit: Payer: Self-pay | Admitting: Family Medicine

## 2021-09-08 ENCOUNTER — Encounter: Payer: Self-pay | Admitting: Gastroenterology

## 2021-09-08 ENCOUNTER — Telehealth: Payer: Self-pay | Admitting: Orthopedic Surgery

## 2021-09-08 DIAGNOSIS — Z1231 Encounter for screening mammogram for malignant neoplasm of breast: Secondary | ICD-10-CM

## 2021-09-08 NOTE — Telephone Encounter (Signed)
Received call from Patient's daughter Almyra Free she advised patient's shoulder is hurting pretty bad and need to be seen by Dr. Marlou Sa. She said patient had surgery in 2017 by Dr. Marlou Sa. Almyra Free asked if patient can be seen as soon as possible. The number to contact Almyra Free is  [phone]

## 2021-09-10 ENCOUNTER — Ambulatory Visit: Payer: Self-pay

## 2021-09-10 ENCOUNTER — Encounter: Payer: Self-pay | Admitting: Orthopedic Surgery

## 2021-09-10 ENCOUNTER — Telehealth: Payer: Self-pay

## 2021-09-10 ENCOUNTER — Ambulatory Visit: Payer: Medicare Other | Admitting: Orthopedic Surgery

## 2021-09-10 ENCOUNTER — Other Ambulatory Visit: Payer: Self-pay

## 2021-09-10 DIAGNOSIS — Z96611 Presence of right artificial shoulder joint: Secondary | ICD-10-CM

## 2021-09-10 DIAGNOSIS — M25511 Pain in right shoulder: Secondary | ICD-10-CM | POA: Diagnosis not present

## 2021-09-10 NOTE — Progress Notes (Signed)
Office Visit Note   Patient: Tonya [name]           Date of Birth: 1941/04/01           MRN: [id number] Visit Date: 09/10/2021 Requested by: [name], MD [place],  [place] [address] PCP: [name], MD  Subjective: Chief Complaint  Patient presents with   Right Shoulder - Pain    HPI: TAWNIE EHRESMAN is a 80 y.o. female who presents to the office complaining of right shoulder pain.  Patient complains of pain for the last 2 weeks without any known injury.  She has history of right reverse shoulder arthroplasty on 05/28/2016 She did well from this procedure which was done for a fracture.  She has had good pain relief up until the last 2 weeks when she awoke 1 day with pain.  Localizes pain to the anterior aspect of the right shoulder.  She notes difficulty and pain with lifting her arm in forward flexion but no pain or difficulty with AB duction.  She only notices pain with lifting the arm and denies any pain at rest.  Denies any fevers, chills, night sweats, drainage from the incision, change in appearance of the incision, chest pain, shortness of breath.  No neck pain, scapular pain, numbness/tingling, radicular pain down the arm.  Taking Tylenol which helps with pain control..                ROS: All systems reviewed are negative as they relate to the chief complaint within the history of present illness.  Patient denies fevers or chills.  Assessment & Plan: Visit Diagnoses:  1. Right shoulder pain, unspecified chronicity   2. History of arthroplasty of right shoulder     Plan: Patient is a 80 year old female who presents for right shoulder evaluation following right reverse shoulder arthroplasty on 05/28/2016 .  Doing well up until the last 2 weeks.  Now she has pain specifically with forward flexion and with lifting things in front of her.  No sign of infection on exam.  Radiographs taken today demonstrate shoulder prosthesis that looks in about the same  position as last set of radiographs about 5 years ago.  Plan to order CBC, ESR, CRP to rule out periprosthetic joint infection.  Also ordered CT of the right shoulder for further evaluation of occult loosening.  Fairly low suspicion for acromial stress fracture with lack of tenderness over the acromion today.  Could be a problem with subscapularis as testing subscap strength seems to be the most symptomatic maneuver on exam today.  Follow-up after studies to review results    - labs negative for infection at time of dictation.  Follow-Up Instructions: No follow-ups on file.   Orders:  Orders Placed This Encounter  Procedures   XR Shoulder Right   CT SHOULDER RIGHT WO CONTRAST   CBC with Differential   Sed Rate (ESR)   C-reactive protein   No orders of the defined types were placed in this encounter.     Procedures: No procedures performed   Clinical Data: No additional findings.  Objective: Vital Signs: There were no vitals taken for this visit.  Physical Exam:  Constitutional: Patient appears well-developed HEENT:  Head: Normocephalic Eyes:EOM are normal Neck: Normal range of motion Cardiovascular: Normal rate Pulmonary/chest: Effort normal Neurologic: Patient is alert Skin: Skin is warm Psychiatric: Patient has normal mood and affect  Ortho Exam: Ortho exam demonstrates right shoulder with  85 degrees abduction, 45 degrees external rotation, 120 degrees forward flexion.  Incision is well-healed from prior surgery with no evidence of infection or any sinus tract noted.  No pain with passive motion of the shoulder.  Tenderness over the anterior aspect of the shoulder.  No tenderness over the [facility] joint.  No specific tenderness over the acromion.  No pain with passive or active AB duction.  Excellent abduction strength with no reproduction of pain.  Excellent subscapularis strength but this reproduces her pain when testing her subscap strength.  No asymmetric increase in external  rotation compared with the contralateral arm.  Specialty Comments:  No specialty comments available.  Imaging: No results found.   PMFS History: Patient Active Problem List   Diagnosis Date Noted   Saccular aneurysm 09/05/2021   Secondary hyperparathyroidism ([place]) 04/11/2021   Ischemic vascular dementia ([place]) 04/11/2021   Secondary hypoparathyroidism ([place]) 04/08/2021   Left leg swelling 03/05/2020   Hypertension associated with diabetes ([place]) 01/09/2020   Lightheadedness 01/09/2020   Plantar fasciitis, left 12/13/2019   Acquired trigger finger of left ring finger 07/11/2019   Osteoarthritis of both wrists 07/11/2019   Coronary artery disease involving native coronary artery of native heart without angina pectoris 10/24/2018   Senile purpura ([place]) 03/01/2018   History of total replacement of right shoulder joint 11/06/2016   Anemia of chronic disease 11/29/2015   Spinal stenosis, lumbar region, with neurogenic claudication 11/26/2015    Class: Chronic   Mixed incontinence urge and stress 10/29/2015   CKD stage 4 due to type 2 diabetes mellitus ([place]) 06/25/2015   Counseling regarding end of life decision making 10/26/2014   S/P total knee replacement using cement 04/03/2014   Atrial fibrillation ([place]) 10/17/2010   Allergic rhinitis 01/23/2009   ADENOCARCINOMA, COLON, CECUM 10/05/2008   Coronary atherosclerosis 11/23/2007   ANXIETY 09/19/2007   Major depressive disorder, recurrent episode, moderate ([place]) 09/19/2007   GERD 09/19/2007   Controlled type 2 diabetes mellitus with diabetic nephropathy ([place]) 03/21/2007   SYNDROME, CARPAL TUNNEL 03/21/2007   Hyperlipidemia associated with type 2 diabetes mellitus ([place]) 03/21/2007   IBS 03/21/2007   Osteoarthrosis, unspecified whether generalized or localized, unspecified site 03/21/2007   Past Medical History:  Diagnosis Date   Adenocarcinoma, colon ([place])    Anemia    iron deficiency   Anxiety    Carpal tunnel syndrome     Cataract    REMOVED BILATERAL   Chronic diarrhea    Colon polyps    Tubular adenoma   Degenerative disk disease    Depression    Diabetes mellitus, type 2 ([place])    Dysrhythmia    h/o AFIB   GERD (gastroesophageal reflux disease)    History of blood transfusion    After surgeries   Hyperlipidemia    Hypertension    IBS (irritable bowel syndrome)    Kidney disorder    Osteoarthritis    Osteoporosis    PONV (postoperative nausea and vomiting)    S/P coronary artery stent placement 2007    Family History  Problem Relation Age of Onset   Heart disease Mother        massive MI age 34   Heart attack Mother    Heart disease Father        Massive MI age 64   Prostate cancer Father    Diabetes Paternal Grandmother    Colon polyps Sister    Colon cancer Neg Hx     Past Surgical History:  Procedure  Laterality Date   CARDIAC CATHETERIZATION  12/10   LAD stent patent. Insignificant CAD, otherwise EF 60-65%   CARPAL TUNNEL RELEASE     CATARACT EXTRACTION  03/2015   CHOLECYSTECTOMY     COLON SURGERY     COLONOSCOPY     DILATION AND CURETTAGE OF UTERUS     HEMATOMA EVACUATION     after knee replacement   KNEE ARTHROSCOPY     bilateral   LUMBAR LAMINECTOMY N/A 11/26/2015   Procedure: LUMBAR DECOMPRESSIVE LAMINECTOMY L3-4 AND L4-5, LEFT L2-3 HEMILAMINECTOMY;  Surgeon: Jessy Oto, MD;  Location: [facility];  Service: Orthopedics;  Laterality: N/A;   ORIF ELBOW FRACTURE Left 03/18/2021   Procedure: OPEN REDUCTION INTERNAL FIXATION (ORIF) ELBOW/OLECRANON FRACTURE;  Surgeon: Renette Butters, MD;  Location: [facility];  Service: Orthopedics;  Laterality: Left;   PARTIAL COLECTOMY  11/2008   right, for adenocarcinoma   REPLACEMENT TOTAL KNEE  0/0349   right, complicated by hemarthrosis    REVERSE SHOULDER ARTHROPLASTY Right 05/28/2016   REVERSE SHOULDER ARTHROPLASTY Right 05/28/2016   Procedure: REVERSE SHOULDER ARTHROPLASTY;  Surgeon: Meredith Pel, MD;  Location: [facility];  Service:  Orthopedics;  Laterality: Right;   TONSILLECTOMY     TOTAL KNEE ARTHROPLASTY Left 04/03/2014   dr whitfield   TOTAL KNEE ARTHROPLASTY Left 04/03/2014   Procedure: TOTAL KNEE ARTHROPLASTY;  Surgeon: Garald Balding, MD;  Location: [facility];  Service: Orthopedics;  Laterality: Left;   TOTAL KNEE REVISION  03/08/2012   Procedure: TOTAL KNEE REVISION;  Surgeon: Garald Balding, MD;  Location: [facility];  Service: Orthopedics;  Laterality: Right;  removal total knee hardware and placement of antibiotic cement spacer and antibiotic beads   TOTAL KNEE REVISION  05/17/2012   Procedure: TOTAL KNEE REVISION;  Surgeon: Garald Balding, MD;  Location: [facility];  Service: Orthopedics;  Laterality: Right;  right total knee revision, removal of antibiotic spacer   Social History   Occupational History   Occupation: retired    Fish farm manager: RETIRED  Tobacco Use   Smoking status: Never   Smokeless tobacco: Never  Vaping Use   Vaping Use: Never used  Substance and Sexual Activity   Alcohol use: No    Alcohol/week: 0.0 standard drinks   Drug use: No   Sexual activity: Never

## 2021-09-10 NOTE — Chronic Care Management (AMB) (Signed)
° ° °  Chronic Care Management Pharmacy Assistant   Name: Tonya [name]  MRN: [id number] DOB: [birth year]  Reason for Encounter: Reminder Call   Conditions to be addressed/monitored: HTN, HLD, DMII, and CKD Stage 4   Medications: Outpatient Encounter Medications as of 09/10/2021  Medication Sig   apixaban (ELIQUIS) 2.5 MG TABS tablet Take 1 tablet (2.5 mg total) by mouth 2 (two) times daily.   atorvastatin (LIPITOR) 20 MG tablet TAKE 1 TABLET BY MOUTH EVERY DAY   Blood Glucose Monitoring Suppl (ACCU-CHEK AVIVA PLUS) w/Device KIT Use to check blood once sugar daily.   carvedilol (COREG) 12.5 MG tablet TAKE 1 TABLET (12.5 MG TOTAL) BY MOUTH 2 (TWO) TIMES DAILY WITH A MEAL.   cetirizine (ZYRTEC) 10 MG tablet Take 10 mg by mouth at bedtime.   cholestyramine light (PREVALITE) 4 g packet USE 1 PACKET TWICE DAILY WITH A MEAL, AT LEAST 2 HOURS AWAY FROM OTHER MEDS  Patient needs an appointment for further refills.   citalopram (CELEXA) 40 MG tablet Take 1 tablet (40 mg total) by mouth daily.   diltiazem (CARDIZEM CD) 180 MG 24 hr capsule TAKE 1 CAPSULE BY MOUTH EVERY DAY   ferrous sulfate 325 (65 FE) MG EC tablet Take 325 mg by mouth 2 (two) times daily.   glucose blood (ACCU-CHEK AVIVA PLUS) test strip Use to check blood sugar daily.   isosorbide mononitrate (IMDUR) 30 MG 24 hr tablet TAKE 1 TABLET BY MOUTH EVERY DAY   losartan (COZAAR) 100 MG tablet Take 1 tablet (100 mg total) by mouth daily.   omeprazole (PRILOSEC) 20 MG capsule TAKE 1 CAPSULE BY MOUTH EVERY DAY   Vitamin D, Ergocalciferol, (DRISDOL) 1.25 MG (50000 UNIT) CAPS capsule TAKE 1 CAPSULE (50,000 UNITS TOTAL) BY MOUTH EVERY 7 (SEVEN) DAYS   No facility-administered encounter medications on file as of 09/10/2021.   Tonya [name] did not answer the telephone to remind her of her upcoming telephone visit with [name] on 09/16/21 at 9:00am. Patient was reminded to have all medications, supplements and any blood glucose and  blood pressure readings available for review at appointment.Detailed message left on machine.     Star Rating Drugs: Medication:  Last Fill: Day Supply Atorvastatin 59m 05/23/21 90 Losartan 1089m10/21/22 90[place] notified  VeAvel SensorCCBreezy Pointssistant [phone] time spent for month CPA: 10 min

## 2021-09-11 LAB — CBC WITH DIFFERENTIAL/PLATELET
Absolute Monocytes: 578 cells/uL (ref 200–950)
Basophils Absolute: 78 cells/uL (ref 0–200)
Basophils Relative: 0.8 %
Eosinophils Absolute: 167 cells/uL (ref 15–500)
Eosinophils Relative: 1.7 %
HCT: 39.1 % (ref 35.0–45.0)
Hemoglobin: 13.5 g/dL (ref 11.7–15.5)
Lymphs Abs: 1676 cells/uL (ref 850–3900)
MCH: 32.3 pg (ref 27.0–33.0)
MCHC: 34.5 g/dL (ref 32.0–36.0)
MCV: 93.5 fL (ref 80.0–100.0)
MPV: 11.6 fL (ref 7.5–12.5)
Monocytes Relative: 5.9 %
Neutro Abs: 7301 cells/uL (ref 1500–7800)
Neutrophils Relative %: 74.5 %
Platelets: 193 10*3/uL (ref 140–400)
RBC: 4.18 10*6/uL (ref 3.80–5.10)
RDW: 12 % (ref 11.0–15.0)
Total Lymphocyte: 17.1 %
WBC: 9.8 10*3/uL (ref 3.8–10.8)

## 2021-09-11 LAB — C-REACTIVE PROTEIN: CRP: 0.2 mg/L (ref <8.0)

## 2021-09-11 LAB — SEDIMENTATION RATE: Sed Rate: 2 mm/h (ref 0–30)

## 2021-09-11 NOTE — Progress Notes (Signed)
Labs ok pls clala thx infxn unlikely

## 2021-09-11 NOTE — Progress Notes (Signed)
IC advised.  

## 2021-09-12 ENCOUNTER — Telehealth: Payer: Self-pay | Admitting: Orthopedic Surgery

## 2021-09-12 NOTE — Telephone Encounter (Signed)
Called pt 1X and left vm for pt to call and set an CT Scan review with Dr. Marlou Sa after 09/15/21

## 2021-09-15 ENCOUNTER — Other Ambulatory Visit: Payer: Self-pay

## 2021-09-15 ENCOUNTER — Ambulatory Visit
Admission: RE | Admit: 2021-09-15 | Discharge: 2021-09-15 | Disposition: A | Discharge status: Home / Self Care | Payer: Medicare Other | Source: Ambulatory Visit | Attending: Orthopedic Surgery | Admitting: Orthopedic Surgery

## 2021-09-15 ENCOUNTER — Telehealth: Payer: Self-pay | Admitting: Gastroenterology

## 2021-09-15 DIAGNOSIS — Z96611 Presence of right artificial shoulder joint: Secondary | ICD-10-CM

## 2021-09-15 DIAGNOSIS — M25511 Pain in right shoulder: Secondary | ICD-10-CM | POA: Diagnosis not present

## 2021-09-15 NOTE — Telephone Encounter (Signed)
The pt has been advised that she will be able to fill out the paperwork when she comes in for the visit.

## 2021-09-15 NOTE — Progress Notes (Signed)
IC advised.  

## 2021-09-15 NOTE — Progress Notes (Signed)
CT scan okay.  No acute issues.  Please call thanks.

## 2021-09-15 NOTE — Telephone Encounter (Signed)
Inbound call from patient, states that she got a letter in the mail about her next appointment in January. In the letter she said it "stated complete the enclosed self-administered evaluation form". Patient stated there was no form to fill out with the letter. Patient is wondering if that is something she needs or is that something she can do in the office. Please advise.

## 2021-09-16 ENCOUNTER — Telehealth: Payer: Medicare Other

## 2021-09-16 ENCOUNTER — Telehealth: Payer: Self-pay

## 2021-09-16 NOTE — Telephone Encounter (Signed)
°  Chronic Care Management   Outreach Note  09/16/2021 Name: NATILIE KRABBENHOFT MRN: [id number] DOB: [birth year]  Patient had a phone appointment scheduled with clinical pharmacist today.   An unsuccessful telephone outreach was attempted. The patient was referred to the pharmacist for assistance with medications, care management and care coordination.    Patient will NOT be penalized in any way for missing a CCM appointment. The no-show fee does not apply.   A message was left to return call to: [phone].  Spoke with patient's daughter briefly and she explained patient organizes and administers her own medications without difficulty.   [name], PharmD Clinical Pharmacist [facility] [phone]

## 2021-09-16 NOTE — Progress Notes (Deleted)
Chronic Care Management Pharmacy Note  09/16/2021 Name:  Tonya [name] MRN:  [id number] DOB:  Mar 15, 1941  Summary: ***  Recommendations/Changes made from today's visit: ***  Plan: ***   Subjective: Tonya [name] is an 80 y.o. year old female who is a primary patient of Bedsole, Tonya E, MD.  The CCM team was consulted for assistance with disease management and care coordination needs.    Engaged with patient by telephone for follow up visit in response to provider referral for pharmacy case management and/or care coordination services.   Consent to Services:  The patient was given information about Chronic Care Management services, agreed to services, and gave verbal consent prior to initiation of services.  Please see initial visit note for detailed documentation.   Patient Care Team: Tonya Sanders, MD as PCP - Tonya [name], Tonya [name] as Pharmacist (Pharmacist)  Recent office visits: 09/05/21 - Tonya Lofts, MD - Pt presented for routine general medical. Increase citalopram to 40 mg daily. Restart B12 injections. Schedule repeat colonoscopy.  Recent consult visits: 09/10/21 - Orthopedics - Pt presented for right shoulder pain. Labs okay, return for CT/MRI. 08/13/21 - Nephrology - Pt presented for stage 3b CKD. Hyperparathyroidism due to renal insufficiency. PTH of 113 from July 2022. Also noted to have hypocalcemia at that time. Patient states she is taking 2 calcium pills daily as calcium supplements. We will repeat calcium levels today 06/20/21 - Cardiology - Pt presented for AFIB. Order BNP, EKG, Lexiscan Myoview for SOB. Follow up 6 months.  Hospital visits: None in previous 6 months   Objective:  Lab Results  Component Value Date   CREATININE 1.56 (H) 09/05/2021   BUN 28 (H) 09/05/2021   GFR 31.00 (L) 09/05/2021   GFRNONAA 34 (L) 03/18/2021   GFRAA 30 (L) 11/15/2017   NA 141 09/05/2021   K 4.7 09/05/2021   CALCIUM 9.2 09/05/2021   CO2 26 09/05/2021    GLUCOSE 135 (H) 09/05/2021    Lab Results  Component Value Date/Time   HGBA1C 6.6 (H) 09/05/2021 10:33 AM   HGBA1C 6.1 (H) 03/18/2021 10:50 AM   GFR 31.00 (L) 09/05/2021 10:33 AM   GFR 30.86 (L) 03/28/2021 10:18 AM   MICROALBUR 2.8 (H) 02/19/2011 10:44 AM   MICROALBUR 4.2 (H) 07/09/2010 08:26 AM    Last diabetic Eye exam:  Lab Results  Component Value Date/Time   HMDIABEYEEXA No Retinopathy 09/12/2020 12:00 AM    Last diabetic Foot exam:  Lab Results  Component Value Date/Time   HMDIABFOOTEX done 01/09/2021 12:00 AM     Lab Results  Component Value Date   CHOL 110 09/05/2021   HDL 42.30 09/05/2021   LDLCALC 35 09/05/2021   LDLDIRECT 50.0 10/22/2014   TRIG 165.0 (H) 09/05/2021   CHOLHDL 3 09/05/2021    Hepatic Function Latest Ref Rng & Units 09/05/2021 03/28/2021 01/02/2021  Total Protein 6.0 - 8.3 g/dL 6.8 6.4 7.0  Albumin 3.5 - 5.2 g/dL 4.2 4.0 4.3  AST 0 - 37 U/L _0 ALT 0 - 35 U/L _1 Alk Phosphatase 39 - 117 U/L 72 91 71  Total Bilirubin 0.2 - 1.2 mg/dL 1.1 0.5 0.9  Bilirubin, Direct 0.0 - 0.3 mg/dL - - -    Lab Results  Component Value Date/Time   TSH 2.80 03/28/2021 10:18 AM   TSH 3.810 10/24/2018 12:09 PM   FREET4 0.98 04/25/2013 09:48 AM    CBC Latest Ref Rng & Units 09/10/2021  03/28/2021 03/18/2021  WBC 3.8 - 10.8 Thousand/uL 9.8 10.2 10.8(H)  Hemoglobin 11.7 - 15.5 g/dL 13.5 12.4 12.5  Hematocrit 35.0 - 45.0 % 39.1 36.3 37.1  Platelets 140 - 400 Thousand/uL 193 234.0 233    Lab Results  Component Value Date/Time   VD25OH 31.15 03/28/2021 10:18 AM   VD25OH 31.41 07/02/2020 08:02 AM    Clinical ASCVD: Yes  The ASCVD Risk score (Arnett DK, et al., 2019) failed to calculate for the following reasons:   The 2019 ASCVD risk score is only valid for ages 24 to 24    Depression screen PHQ 2/9 09/05/2021 08/14/2021 07/11/2020  Decreased Interest 3 0 0  Down, Depressed, Hopeless 1 0 0  PHQ - 2 Score 4 0 0  Altered sleeping 0 - 0  Tired,  decreased energy 3 - 0  Change in appetite 2 - 0  Feeling bad or failure about yourself  3 - 0  Trouble concentrating 0 - 0  Moving slowly or fidgety/restless 0 - 0  Suicidal thoughts 1 - 0  PHQ-9 Score 13 - 0  Difficult doing work/chores Somewhat difficult - -  Some recent data might be hidden   Social History   Tobacco Use  Smoking Status Never  Smokeless Tobacco Never   BP Readings from Last 3 Encounters:  09/05/21 126/78  06/20/21 130/82  03/28/21 122/80   Pulse Readings from Last 3 Encounters:  09/05/21 97  06/20/21 (!) 104  03/28/21 (!) 113   Wt Readings from Last 3 Encounters:  09/05/21 161 lb 4 oz (73.1 kg)  08/14/21 165 lb (74.8 kg)  07/04/21 163 lb (73.9 kg)   BMI Readings from Last 3 Encounters:  09/05/21 26.83 kg/m  08/14/21 26.63 kg/m  07/04/21 26.31 kg/m    Assessment/Interventions: Review of patient past medical history, allergies, medications, health status, including review of consultants reports, laboratory and other test data, was performed as part of comprehensive evaluation and provision of chronic care management services.   SDOH:  (Social Determinants of Health) assessments and interventions performed: Yes  SDOH Screenings   Alcohol Screen: Low Risk    Last Alcohol Screening Score (AUDIT): 0  Depression (PHQ2-9): Medium Risk   PHQ-2 Score: 13  Financial Resource Strain: Low Risk    Difficulty of Paying Living Expenses: Not hard at all  Food Insecurity: No Food Insecurity   Worried About Charity fundraiser in the Last Year: Never true   Ran Out of Food in the Last Year: Never true  Housing: Low Risk    Last Housing Risk Score: 0  Physical Activity: Inactive   Days of Exercise per Week: 0 days   Minutes of Exercise per Session: 0 min  Social Connections: Moderately Isolated   Frequency of Communication with Friends and Family: Once a week   Frequency of Social Gatherings with Friends and Family: Twice a week   Attends Religious  Services: More than 4 times per year   Active Member of [employer] or Organizations: No   Attends Archivist Meetings: Never   Marital Status: Widowed  Stress: No Stress Concern Present   Feeling of Stress : Not at all  Tobacco Use: Low Risk    Smoking Tobacco Use: Never   Smokeless Tobacco Use: Never   Passive Exposure: Not on file  Transportation Needs: No Transportation Needs   Lack of Transportation (Medical): No   Lack of Transportation (Non-Medical): No    CCM Care Plan  Allergies  Allergen  Reactions   Amoxicillin-Pot Clavulanate Swelling and Other (See Comments)    Augmentin per patient, tongue swelling    Medications Reviewed Today     Reviewed by Rosalin Hawking (Physician Assistant Certified) on 67/73/73 at [facility] List Status: <None>   Medication Order Taking? Sig Documenting Provider Last Dose Status Informant  apixaban (ELIQUIS) 2.5 MG TABS tablet [id number] No Take 1 tablet (2.5 mg total) by mouth 2 (two) times daily. Tonya Sanders, MD Taking Active Self           Med Note Delsa Sale, [place] S   Fri Mar 28, 2021  8:52 AM)    atorvastatin (LIPITOR) 20 MG tablet [id number] No TAKE 1 TABLET BY MOUTH EVERY DAY Minus Breeding, MD Taking Active   Blood Glucose Monitoring Suppl (ACCU-CHEK AVIVA PLUS) w/Device KIT [id number] No Use to check blood once sugar daily. Tonya Sanders, MD Taking Active Self  carvedilol (COREG) 12.5 MG tablet [id number] No TAKE 1 TABLET (12.5 MG TOTAL) BY MOUTH 2 (TWO) TIMES DAILY WITH A MEAL. Tonya Sanders, MD Taking Active   cetirizine (ZYRTEC) 10 MG tablet [id number] No Take 10 mg by mouth at bedtime. [provider] Taking Active Self  cholestyramine light (PREVALITE) 4 g packet [id number] No USE 1 PACKET TWICE DAILY WITH A MEAL, AT LEAST 2 HOURS AWAY FROM OTHER MEDS  Patient needs an appointment for further refills. Milus Banister, MD Taking Active   citalopram (CELEXA) 40 MG tablet [id number]  Take 1 tablet (40 mg total) by  mouth daily. Tonya Sanders, MD  Active   diltiazem (CARDIZEM CD) 180 MG 24 hr capsule [id number] No TAKE 1 CAPSULE BY MOUTH EVERY DAY Bedsole, Tonya E, MD Taking Active   ferrous sulfate 325 (65 FE) MG EC tablet [id number] No Take 325 mg by mouth 2 (two) times daily. Tonya Sanders, MD Taking Active Self  glucose blood (ACCU-CHEK AVIVA PLUS) test strip [id number] No Use to check blood sugar daily. Tonya Sanders, MD Taking Active Self  isosorbide mononitrate (IMDUR) 30 MG 24 hr tablet [id number] No TAKE 1 TABLET BY MOUTH EVERY DAY Hochrein, James, MD Taking Active Self  losartan (COZAAR) 100 MG tablet [id number] No Take 1 tablet (100 mg total) by mouth daily. Minus Breeding, MD Taking Active   omeprazole (PRILOSEC) 20 MG capsule [id number] No TAKE 1 CAPSULE BY MOUTH EVERY DAY Bedsole, Tonya E, MD Taking Active Self  Vitamin D, Ergocalciferol, (DRISDOL) 1.25 MG (50000 UNIT) CAPS capsule [id number] No TAKE 1 CAPSULE (50,000 UNITS TOTAL) BY MOUTH EVERY 7 (SEVEN) DAYS Tonya Sanders, MD Taking Active             Patient Active Problem List   Diagnosis Date Noted   Saccular aneurysm 09/05/2021   Secondary hyperparathyroidism ([place]) 04/11/2021   Ischemic vascular dementia ([place]) 04/11/2021   Secondary hypoparathyroidism ([place]) 04/08/2021   Left leg swelling 03/05/2020   Hypertension associated with diabetes ([place]) 01/09/2020   Lightheadedness 01/09/2020   Plantar fasciitis, left 12/13/2019   Acquired trigger finger of left ring finger 07/11/2019   Osteoarthritis of both wrists 07/11/2019   Coronary artery disease involving native coronary artery of native heart without angina pectoris 10/24/2018   Senile purpura ([place]) 03/01/2018   History of total replacement of right shoulder joint 11/06/2016   Anemia of chronic disease 11/29/2015   Spinal stenosis, lumbar region, with neurogenic claudication 11/26/2015    Class: Chronic   Mixed incontinence urge and stress 10/29/2015  CKD stage 4 due to type 2  diabetes mellitus ([place]) 06/25/2015   Counseling regarding end of life decision making 10/26/2014   S/P total knee replacement using cement 04/03/2014   Atrial fibrillation ([place]) 10/17/2010   Allergic rhinitis 01/23/2009   ADENOCARCINOMA, COLON, CECUM 10/05/2008   Coronary atherosclerosis 11/23/2007   ANXIETY 09/19/2007   Major depressive disorder, recurrent episode, moderate ([place]) 09/19/2007   GERD 09/19/2007   Controlled type 2 diabetes mellitus with diabetic nephropathy ([place]) 03/21/2007   SYNDROME, CARPAL TUNNEL 03/21/2007   Hyperlipidemia associated with type 2 diabetes mellitus ([place]) 03/21/2007   IBS 03/21/2007   Osteoarthrosis, unspecified whether generalized or localized, unspecified site 03/21/2007    Immunization History  Administered Date(s) Administered   Influenza Split 06/29/2011, 07/08/2011, 06/17/2013   Influenza Whole 07/01/2007, 06/18/2008, 07/12/2009, 07/17/2010   Influenza, High Dose Seasonal PF 06/25/2014, 06/10/2015, 06/06/2016, 05/26/2017, 05/28/2018, 05/21/2019, 05/16/2020, 06/08/2021   Moderna Sars-Covid-2 Vaccination 01/25/2020, 02/22/2020   PFIZER Comirnaty(Gray Top)Covid-19 Tri-Sucrose Vaccine 02/14/2021   PFIZER(Purple Top)SARS-COV-2 Vaccination 09/08/2020   Pneumococcal Conjugate-13 10/26/2014   Pneumococcal Polysaccharide-23 09/29/2003, 05/20/2012   Td 07/12/2009    Conditions to be addressed/monitored:  {USCCMDZASSESSMENTOPTIONS:23563}  There are no care plans that you recently modified to display for this patient.   Current Barriers:  {pharmacybarriers:24917}  Pharmacist Clinical Goal(s):  Patient will {PHARMACYGOALCHOICES:24921} through collaboration with PharmD and provider.   Interventions: 1:1 collaboration with Tonya Sanders, MD regarding development and update of comprehensive plan of care as evidenced by provider attestation and co-signature Inter-disciplinary care team collaboration (see longitudinal plan of care) Comprehensive  medication review performed; medication list updated in electronic medical record  Hypertension (BP goal {CHL HP UPSTREAM Pharmacist BP ranges:[phone]}) -{US controlled/uncontrolled:25276} -Current treatment: Hydrochlorothiazide 25 mg - 1 tablet daily prn Losartan 100 mg - 1 tablet daily Carvedilol 12.5 mg - 1 tablet BID Diltiazem 180 mg - 1 capsule daily  Isosorbide Mononitrate 30 mg - 1 tablet daily  -Medications previously tried: ***  -Current home readings: *** -Current dietary habits: *** -Current exercise habits: *** -{ACTIONS;DENIES/REPORTS:21021675::"Denies"} hypotensive/hypertensive symptoms -Educated on {CCM BP Counseling:25124} -Counseled to monitor BP at home ***, document, and provide log at future appointments -{CCMPHARMDINTERVENTION:25122}  Hyperlipidemia: (LDL goal < 70) -{US controlled/uncontrolled:25276} -Current treatment: Atorvastatin 20 mg - 1 tablet daily  -Medications previously tried: ***  -Current dietary patterns: *** -Current exercise habits: *** -Educated on {CCM HLD Counseling:25126} -{CCMPHARMDINTERVENTION:25122}  Diabetes (A1c goal {A1c goals:23924}) -{US controlled/uncontrolled:25276} -Current medications: No medication  -Medications previously tried: ***  -Current home glucose readings fasting glucose: *** post prandial glucose: *** -{ACTIONS;DENIES/REPORTS:21021675::"Denies"} hypoglycemic/hyperglycemic symptoms -Current meal patterns:  breakfast: ***  lunch: ***  dinner: *** snacks: *** drinks: *** -Current exercise: *** -Educated on {CCM DM COUNSELING:25123} -Counseled to check feet daily and get yearly eye exams -{CCMPHARMDINTERVENTION:25122}  Depression/Anxiety (Goal: ***) -{US controlled/uncontrolled:25276} -Current treatment: Celexa 40 mg - 1 tablet daily -Medications previously tried/failed: *** -PHQ9: *** -GAD7: *** -Connected with *** for mental health support -Educated on {CCM mental health  counseling:25127} -{CCMPHARMDINTERVENTION:25122}  Patient Goals/Self-Care Activities Patient will:  - {pharmacypatientgoals:24919}  Follow Up Plan: {CM FOLLOW UP WUJW:11914}  Medication Assistance: {MEDASSISTANCEINFO:25044}  Compliance/Adherence/Medication fill history: Care Gaps: DEXA Mammogram Colonoscopy Eye exam  Star-Rating Drugs: Medication:                Last Fill:         Day Supply Atorvastatin 79m        05/23/21  90 Losartan 176m          07/18/21           90  Patient's preferred pharmacy is:  [id number] [place], [place]: [phone]: [phone] pill box? Yes Pt endorses ***% compliance  Spoke with patient daughter briefly, she reported pt administers and organizes her own medications. We discussed: {Pharmacy options:24294} Patient decided to: {[facility] and Follow Up Patient Decision:  Patient agrees to Care Plan and Follow-up.  MiDebbora DusPharmD Clinical Pharmacist Practitioner [phone]

## 2021-09-16 NOTE — Telephone Encounter (Signed)
Chart reviewed prior to appointment: Recent office visits: 09/05/21 - [name], MD - Pt presented for routine general medical. Increase citalopram to 40 mg daily. Restart B12 injections. Schedule repeat colonoscopy.  Recent consult visits: 09/10/21 - Orthopedics - Pt presented for right shoulder pain. Labs okay, return for CT/MRI. 08/13/21 - Nephrology - Pt presented for stage 3b CKD. Hyperparathyroidism due to renal insufficiency. PTH of 113 from July 2022. Also noted to have hypocalcemia at that time. Patient states she is taking 2 calcium pills daily as calcium supplements. We will repeat calcium levels today 06/20/21 - Cardiology - Pt presented for AFIB. Order BNP, EKG, Lexiscan Myoview for SOB. Follow up 6 months.  Hospital visits: None in previous 6 months  Compliance/Adherence/Medication fill history: Care Gaps: DEXA Mammogram Colonoscopy Eye exam  Star-Rating Drugs: Medication:                Last Fill:         Day Supply Atorvastatin 20mg         05/23/21            90 Losartan 100mg           07/18/21           90

## 2021-09-23 NOTE — Progress Notes (Signed)
Appointment has been rescheduled to 01/06/22.

## 2021-10-06 ENCOUNTER — Other Ambulatory Visit: Payer: Self-pay | Admitting: Family Medicine

## 2021-10-08 ENCOUNTER — Ambulatory Visit: Payer: Medicare Other

## 2021-10-10 ENCOUNTER — Ambulatory Visit (INDEPENDENT_AMBULATORY_CARE_PROVIDER_SITE_OTHER): Payer: Medicare Other | Admitting: Surgical

## 2021-10-10 ENCOUNTER — Encounter: Payer: Self-pay | Admitting: Orthopedic Surgery

## 2021-10-10 ENCOUNTER — Other Ambulatory Visit: Payer: Self-pay

## 2021-10-10 DIAGNOSIS — Z96611 Presence of right artificial shoulder joint: Secondary | ICD-10-CM

## 2021-10-10 NOTE — Progress Notes (Signed)
Office Visit Note   Patient: Tonya [name]           Date of Birth: 03-25-1941           MRN: [id number] Visit Date: 10/10/2021 Requested by: [name], MD [place],  [place] [address] PCP: [name], MD  Subjective: Chief Complaint  Patient presents with   scan review    HPI: Tonya [name] is a 81 y.o. female who presents to the office complaining of right shoulder pain.  She is here to review CT scan of the right shoulder.  She reports at least 50% improvement of her pain since her last visit with continual gradual improvement at this point.  She does not feel like she has had a wall as far as how her pain is improving.  Her daughter also notes that her mother is in a lot less pain.  Able to sleep well at night.  No fevers or chills or any sign of infection.  No change in appearance of the incision..                ROS: All systems reviewed are negative as they relate to the chief complaint within the history of present illness.  Patient denies fevers or chills.  Assessment & Plan: Visit Diagnoses:  1. History of arthroplasty of right shoulder     Plan: Patient is an 81 year old female who presents for reevaluation of right shoulder pain.  She is here to review CT scan of the right shoulder.  CT scan was negative for any periprosthetic fracture, lucency, acromial stress fracture.  This was reviewed with her today.  Her lab work from her last visit was negative for any suggestion of periprosthetic infection as well.  With significant improvement of her symptoms since last visit, plan to hold off on any further intervention for now and just have her follow-up in 2 months for clinical recheck.  She will contact the office if things worsen in the meantime.  Follow-Up Instructions: No follow-ups on file.   Orders:  No orders of the defined types were placed in this encounter.  No orders of the defined types were placed in this encounter.      Procedures: No procedures performed   Clinical Data: No additional findings.  Objective: Vital Signs: There were no vitals taken for this visit.  Physical Exam:  Constitutional: Patient appears well-developed HEENT:  Head: Normocephalic Eyes:EOM are normal Neck: Normal range of motion Cardiovascular: Normal rate Pulmonary/chest: Effort normal Neurologic: Patient is alert Skin: Skin is warm Psychiatric: Patient has normal mood and affect  Ortho Exam: Ortho exam demonstrates right shoulder with 60 degrees external rotation, 80 degrees abduction, 130 degrees forward flexion.  No asymmetric increase in external rotation.  Incision from prior shoulder placement is well-healed without evidence of infection or dehiscence.  Axillary nerve intact with deltoid firing.  Weakness with external rotation.  Excellent subscapularis strength with slight pain elicited with testing.  Tenderness over the bicipital groove.  No tenderness over the acromion.  5/5 motor strength of bilateral grip strength, finger abduction, pronation/supination, bicep, tricep, deltoid.  Specialty Comments:  No specialty comments available.  Imaging: No results found.   PMFS History: Patient Active Problem List   Diagnosis Date Noted   Saccular aneurysm 09/05/2021   Secondary hyperparathyroidism ([place]) 04/11/2021   Ischemic vascular dementia ([place]) 04/11/2021   Secondary hypoparathyroidism ([place]) 04/08/2021   Left leg swelling 03/05/2020   Hypertension  associated with diabetes ([place]) 01/09/2020   Lightheadedness 01/09/2020   Plantar fasciitis, left 12/13/2019   Acquired trigger finger of left ring finger 07/11/2019   Osteoarthritis of both wrists 07/11/2019   Coronary artery disease involving native coronary artery of native heart without angina pectoris 10/24/2018   Senile purpura ([place]) 03/01/2018   History of total replacement of right shoulder joint 11/06/2016   Anemia of chronic disease 11/29/2015   Spinal  stenosis, lumbar region, with neurogenic claudication 11/26/2015    Class: Chronic   Mixed incontinence urge and stress 10/29/2015   CKD stage 4 due to type 2 diabetes mellitus ([place]) 06/25/2015   Counseling regarding end of life decision making 10/26/2014   S/P total knee replacement using cement 04/03/2014   Atrial fibrillation ([place]) 10/17/2010   Allergic rhinitis 01/23/2009   ADENOCARCINOMA, COLON, CECUM 10/05/2008   Coronary atherosclerosis 11/23/2007   ANXIETY 09/19/2007   Major depressive disorder, recurrent episode, moderate ([place]) 09/19/2007   GERD 09/19/2007   Controlled type 2 diabetes mellitus with diabetic nephropathy ([place]) 03/21/2007   SYNDROME, CARPAL TUNNEL 03/21/2007   Hyperlipidemia associated with type 2 diabetes mellitus ([place]) 03/21/2007   IBS 03/21/2007   Osteoarthrosis, unspecified whether generalized or localized, unspecified site 03/21/2007   Past Medical History:  Diagnosis Date   Adenocarcinoma, colon ([place])    Anemia    iron deficiency   Anxiety    Carpal tunnel syndrome    Cataract    REMOVED BILATERAL   Chronic diarrhea    Colon polyps    Tubular adenoma   Degenerative disk disease    Depression    Diabetes mellitus, type 2 ([place])    Dysrhythmia    h/o AFIB   GERD (gastroesophageal reflux disease)    History of blood transfusion    After surgeries   Hyperlipidemia    Hypertension    IBS (irritable bowel syndrome)    Kidney disorder    Osteoarthritis    Osteoporosis    PONV (postoperative nausea and vomiting)    S/P coronary artery stent placement 2007    Family History  Problem Relation Age of Onset   Heart disease Mother        massive MI age 22   Heart attack Mother    Heart disease Father        Massive MI age 16   Prostate cancer Father    Diabetes Paternal Grandmother    Colon polyps Sister    Colon cancer Neg Hx     Past Surgical History:  Procedure Laterality Date   CARDIAC CATHETERIZATION  12/10   LAD stent patent.  Insignificant CAD, otherwise EF 60-65%   CARPAL TUNNEL RELEASE     CATARACT EXTRACTION  03/2015   CHOLECYSTECTOMY     COLON SURGERY     COLONOSCOPY     DILATION AND CURETTAGE OF UTERUS     HEMATOMA EVACUATION     after knee replacement   KNEE ARTHROSCOPY     bilateral   LUMBAR LAMINECTOMY N/A 11/26/2015   Procedure: LUMBAR DECOMPRESSIVE LAMINECTOMY L3-4 AND L4-5, LEFT L2-3 HEMILAMINECTOMY;  Surgeon: Jessy Oto, MD;  Location: [facility];  Service: Orthopedics;  Laterality: N/A;   ORIF ELBOW FRACTURE Left 03/18/2021   Procedure: OPEN REDUCTION INTERNAL FIXATION (ORIF) ELBOW/OLECRANON FRACTURE;  Surgeon: Renette Butters, MD;  Location: [facility];  Service: Orthopedics;  Laterality: Left;   PARTIAL COLECTOMY  11/2008   right, for adenocarcinoma   REPLACEMENT TOTAL KNEE  04/2010  right, complicated by hemarthrosis    REVERSE SHOULDER ARTHROPLASTY Right 05/28/2016   REVERSE SHOULDER ARTHROPLASTY Right 05/28/2016   Procedure: REVERSE SHOULDER ARTHROPLASTY;  Surgeon: Meredith Pel, MD;  Location: [facility];  Service: Orthopedics;  Laterality: Right;   TONSILLECTOMY     TOTAL KNEE ARTHROPLASTY Left 04/03/2014   dr whitfield   TOTAL KNEE ARTHROPLASTY Left 04/03/2014   Procedure: TOTAL KNEE ARTHROPLASTY;  Surgeon: Garald Balding, MD;  Location: [facility];  Service: Orthopedics;  Laterality: Left;   TOTAL KNEE REVISION  03/08/2012   Procedure: TOTAL KNEE REVISION;  Surgeon: Garald Balding, MD;  Location: [facility];  Service: Orthopedics;  Laterality: Right;  removal total knee hardware and placement of antibiotic cement spacer and antibiotic beads   TOTAL KNEE REVISION  05/17/2012   Procedure: TOTAL KNEE REVISION;  Surgeon: Garald Balding, MD;  Location: [facility];  Service: Orthopedics;  Laterality: Right;  right total knee revision, removal of antibiotic spacer   Social History   Occupational History   Occupation: retired    Fish farm manager: RETIRED  Tobacco Use   Smoking status: Never   Smokeless tobacco:  Never  Vaping Use   Vaping Use: Never used  Substance and Sexual Activity   Alcohol use: No    Alcohol/week: 0.0 standard drinks   Drug use: No   Sexual activity: Never

## 2021-10-14 ENCOUNTER — Encounter: Payer: Self-pay | Admitting: Gastroenterology

## 2021-10-14 ENCOUNTER — Ambulatory Visit: Payer: Medicare Other | Admitting: Gastroenterology

## 2021-10-14 VITALS — BP 110/60 | HR 104 | Ht 65.0 in | Wt 156.0 lb

## 2021-10-14 DIAGNOSIS — Z85038 Personal history of other malignant neoplasm of large intestine: Secondary | ICD-10-CM | POA: Diagnosis not present

## 2021-10-14 NOTE — Patient Instructions (Signed)
If you are age 82 or older, your body mass index should be between 23-30. Your Body mass index is 25.96 kg/m. If this is out of the aforementioned range listed, please consider follow up with your Primary Care Provider. ________________________________________________________  The [place] [facility] providers would like to encourage you to use [facility] to communicate with providers for non-urgent requests or questions.  Due to long hold times on the telephone, sending your provider a message by [facility] may be a faster and more efficient way to get a response.  Please allow 48 business hours for a response.  Please remember that this is for non-urgent requests.  _______________________________________________________  Please call our office with any worsening symptoms.  Thank you for entrusting me with your care and choosing [facility].  Dr Ardis Hughs

## 2021-10-14 NOTE — Progress Notes (Signed)
Review of pertinent gastrointestinal problems: 1. Colon cancer: early stage synchronous right-sided colon cancers diagnosed and resected in March 2010. She did not require any adjuvant chemotherapy. She has had serial interval follow-up and last colonoscopy was done in March 2014. She had 3 sessile polyps removed which were tubular adenomas.  Colonoscopy October 2019 6 sessile polyps were removed.  2 of them were greater than 1 cm.  These were all tubular adenomas.  2. Chronic loose stools since cancer surgery; responded well to cholestyramine   HPI: This is a very pleasant 81 year old woman who is here with her daughter or granddaughter today  I Last saw her little over 3 years ago, October 2019.  See that colonoscopy summary above.  She lives with her daughter.  She tries to avoid stairs unless she is doing laundry.  She gets around the house fairly well.  She has had no significant change in her bowels.  She has had no overt GI bleeding.  She was recently diagnosed with COVID and has lost about 10 pounds in the past several weeks.  Her appetite is just decreased.  Colon cancer does not run in her family  Old Data Reviewed:  Blood work December 2022 CBC was normal, sed rate was normal, CRP was normal    Review of systems: Pertinent positive and negative review of systems were noted in the above HPI section. All other review negative.   Past Medical History:  Diagnosis Date   Adenocarcinoma, colon ([place])    Anemia    iron deficiency   Anxiety    Carpal tunnel syndrome    Cataract    REMOVED BILATERAL   Chronic diarrhea    Colon polyps    Tubular adenoma   Degenerative disk disease    Depression    Diabetes mellitus, type 2 ([place])    Dysrhythmia    h/o AFIB   GERD (gastroesophageal reflux disease)    History of blood transfusion    After surgeries   Hyperlipidemia    Hypertension    IBS (irritable bowel syndrome)    Kidney disorder    Osteoarthritis    Osteoporosis     PONV (postoperative nausea and vomiting)    S/P coronary artery stent placement 2007    Past Surgical History:  Procedure Laterality Date   CARDIAC CATHETERIZATION  12/10   LAD stent patent. Insignificant CAD, otherwise EF 60-65%   CARPAL TUNNEL RELEASE     CATARACT EXTRACTION  03/2015   CHOLECYSTECTOMY     COLON SURGERY     COLONOSCOPY     DILATION AND CURETTAGE OF UTERUS     HEMATOMA EVACUATION     after knee replacement   KNEE ARTHROSCOPY     bilateral   LUMBAR LAMINECTOMY N/A 11/26/2015   Procedure: LUMBAR DECOMPRESSIVE LAMINECTOMY L3-4 AND L4-5, LEFT L2-3 HEMILAMINECTOMY;  Surgeon: Jessy Oto, MD;  Location: [facility];  Service: Orthopedics;  Laterality: N/A;   ORIF ELBOW FRACTURE Left 03/18/2021   Procedure: OPEN REDUCTION INTERNAL FIXATION (ORIF) ELBOW/OLECRANON FRACTURE;  Surgeon: Renette Butters, MD;  Location: [facility];  Service: Orthopedics;  Laterality: Left;   PARTIAL COLECTOMY  11/2008   right, for adenocarcinoma   REPLACEMENT TOTAL KNEE  09/313   right, complicated by hemarthrosis    REVERSE SHOULDER ARTHROPLASTY Right 05/28/2016   REVERSE SHOULDER ARTHROPLASTY Right 05/28/2016   Procedure: REVERSE SHOULDER ARTHROPLASTY;  Surgeon: Meredith Pel, MD;  Location: [place];  Service: Orthopedics;  Laterality: Right;  TONSILLECTOMY     TOTAL KNEE ARTHROPLASTY Left 04/03/2014   dr whitfield   TOTAL KNEE ARTHROPLASTY Left 04/03/2014   Procedure: TOTAL KNEE ARTHROPLASTY;  Surgeon: Garald Balding, MD;  Location: [place];  Service: Orthopedics;  Laterality: Left;   TOTAL KNEE REVISION  03/08/2012   Procedure: TOTAL KNEE REVISION;  Surgeon: Garald Balding, MD;  Location: [place];  Service: Orthopedics;  Laterality: Right;  removal total knee hardware and placement of antibiotic cement spacer and antibiotic beads   TOTAL KNEE REVISION  05/17/2012   Procedure: TOTAL KNEE REVISION;  Surgeon: Garald Balding, MD;  Location: [place];  Service: Orthopedics;  Laterality: Right;  right  total knee revision, removal of antibiotic spacer    Current Outpatient Medications  Medication Sig Dispense Refill   apixaban (ELIQUIS) 2.5 MG TABS tablet Take 1 tablet (2.5 mg total) by mouth 2 (two) times daily. 60 tablet 11   atorvastatin (LIPITOR) 20 MG tablet TAKE 1 TABLET BY MOUTH EVERY DAY 90 tablet 1   Blood Glucose Monitoring Suppl (ACCU-CHEK AVIVA PLUS) w/Device KIT Use to check blood once sugar daily. 1 kit 0   carvedilol (COREG) 12.5 MG tablet TAKE 1 TABLET (12.5 MG TOTAL) BY MOUTH 2 (TWO) TIMES DAILY WITH A MEAL. 180 tablet 0   cetirizine (ZYRTEC) 10 MG tablet Take 10 mg by mouth at bedtime.     cholestyramine light (PREVALITE) 4 g packet USE 1 PACKET TWICE DAILY WITH A MEAL, AT LEAST 2 HOURS AWAY FROM OTHER MEDS  Patient needs an appointment for further refills. 540 packet 0   citalopram (CELEXA) 40 MG tablet TAKE 1 TABLET BY MOUTH EVERY DAY 90 tablet 1   diltiazem (CARDIZEM CD) 180 MG 24 hr capsule TAKE 1 CAPSULE BY MOUTH EVERY DAY 90 capsule 0   ferrous sulfate 325 (65 FE) MG EC tablet Take 325 mg by mouth 2 (two) times daily.     glucose blood (ACCU-CHEK AVIVA PLUS) test strip Use to check blood sugar daily. 100 each 3   isosorbide mononitrate (IMDUR) 30 MG 24 hr tablet TAKE 1 TABLET BY MOUTH EVERY DAY 90 tablet 2   losartan (COZAAR) 100 MG tablet Take 1 tablet (100 mg total) by mouth daily. 90 tablet 3   omeprazole (PRILOSEC) 20 MG capsule TAKE 1 CAPSULE BY MOUTH EVERY DAY 90 capsule 3   Vitamin D, Ergocalciferol, (DRISDOL) 1.25 MG (50000 UNIT) CAPS capsule TAKE 1 CAPSULE (50,000 UNITS TOTAL) BY MOUTH EVERY 7 (SEVEN) DAYS 12 capsule 0   No current facility-administered medications for this visit.    Allergies as of 10/14/2021 - Review Complete 10/14/2021  Allergen Reaction Noted   Amoxicillin-pot clavulanate Swelling and Other (See Comments)     Family History  Problem Relation Age of Onset   Heart disease Mother        massive MI age 104   Heart attack Mother     Heart disease Father        Massive MI age 72   Prostate cancer Father    Colon polyps Sister    Diabetes Paternal Grandmother    Colon cancer Neg Hx    Esophageal cancer Neg Hx    Pancreatic cancer Neg Hx    Stomach cancer Neg Hx    Liver disease Neg Hx     Social History   Socioeconomic History   Marital status: Divorced    Spouse name: Not on file   Number of children: 3   Years  of education: Not on file   Highest education level: Not on file  Occupational History   Occupation: retired    Fish farm manager: RETIRED  Tobacco Use   Smoking status: Never   Smokeless tobacco: Never  Vaping Use   Vaping Use: Never used  Substance and Sexual Activity   Alcohol use: No    Alcohol/week: 0.0 standard drinks   Drug use: No   Sexual activity: Never  Other Topics Concern   Not on file  Social History Narrative   No regular exercise, limited due to knees   Diet- addicted to sweets, some fruit and veggies, water daily.   Social Determinants of Health   Financial Resource Strain: Low Risk    Difficulty of Paying Living Expenses: Not hard at all  Food Insecurity: No Food Insecurity   Worried About Charity fundraiser in the Last Year: Never true   [place] in the Last Year: Never true  Transportation Needs: No Transportation Needs   Lack of Transportation (Medical): No   Lack of Transportation (Non-Medical): No  Physical Activity: Inactive   Days of Exercise per Week: 0 days   Minutes of Exercise per Session: 0 min  Stress: No Stress Concern Present   Feeling of Stress : Not at all  Social Connections: Moderately Isolated   Frequency of Communication with Friends and Family: Once a week   Frequency of Social Gatherings with Friends and Family: Twice a week   Attends Religious Services: More than 4 times per year   Active Member of [employer] or Organizations: No   Attends Archivist Meetings: Never   Marital Status: Widowed  Human resources officer Violence: Not At Risk    Fear of Current or Ex-Partner: No   Emotionally Abused: No   Physically Abused: No   Sexually Abused: No     Physical Exam: Ht 5' 5" (1.651 m)    Wt 156 lb (70.8 kg)    BMI 25.96 kg/m  Constitutional: generally well-appearing Psychiatric: alert and oriented x3 Eyes: extraocular movements intact Mouth: oral pharynx moist, no lesions Neck: supple no lymphadenopathy Cardiovascular: heart regular rate and rhythm Lungs: clear to auscultation bilaterally Abdomen: soft, nontender, nondistended, no obvious ascites, no peritoneal signs, normal bowel sounds Extremities: no lower extremity edema bilaterally Skin: no lesions on visible extremities   Assessment and plan: 81 y.o. female with personal history of colon cancer, personal history of adenomatous polyps  3 of [place] had a very nice discussion today about colon cancer, surveillance for colon cancer.  I offered surveillance colonoscopy for her and told her that it would very most likely be safe.  She would however have to hold her Eliquis and that carries a small risk which we would clear with her cardiologist.  She asked me what I recommended and I told her personally if I were 81-1/2 I would not want to undergo invasive testing such as a colonoscopy unless I had symptoms.  I would advise my family members the same way.  Patient herself was very much not interested in going ahead with a colonoscopy unless it was absolutely necessary.  In the end after a very nice discussion we agreed that she or her family would call if she has any significant new GI complaints at that point we could revisit the issue.  Please see the "Patient Instructions" section for addition details about the plan.   Owens Loffler, MD [facility] 10/14/2021, 8:39 AM  Cc: [name], MD  Total time on date of encounter was 40  minutes (this included time spent preparing to see the patient reviewing records; obtaining and/or reviewing separately obtained  history; performing a medically appropriate exam and/or evaluation; counseling and educating the patient and family if present; ordering medications, tests or procedures if applicable; and documenting clinical information in the health record).

## 2021-10-17 ENCOUNTER — Ambulatory Visit (INDEPENDENT_AMBULATORY_CARE_PROVIDER_SITE_OTHER): Payer: Medicare Other | Admitting: Family Medicine

## 2021-10-17 ENCOUNTER — Other Ambulatory Visit: Payer: Self-pay

## 2021-10-17 ENCOUNTER — Encounter: Payer: Self-pay | Admitting: Family Medicine

## 2021-10-17 VITALS — BP 120/72 | HR 98 | Temp 97.6°F | Ht 65.0 in | Wt 157.5 lb

## 2021-10-17 DIAGNOSIS — E538 Deficiency of other specified B group vitamins: Secondary | ICD-10-CM

## 2021-10-17 DIAGNOSIS — F0150 Vascular dementia without behavioral disturbance: Secondary | ICD-10-CM | POA: Diagnosis not present

## 2021-10-17 DIAGNOSIS — F331 Major depressive disorder, recurrent, moderate: Secondary | ICD-10-CM | POA: Diagnosis not present

## 2021-10-17 MED ORDER — CYANOCOBALAMIN 1000 MCG/ML IJ SOLN
1000.0000 ug | Freq: Once | INTRAMUSCULAR | Status: AC
  Administered 2021-10-17: 1000 ug via INTRAMUSCULAR

## 2021-10-17 NOTE — Progress Notes (Signed)
Patient ID: Tonya [name], female    DOB: 14-Feb-1941, 81 y.o.   MRN: [id number]  This visit was conducted in person.  BP 120/72    Pulse 98    Temp 97.6 F (36.4 C) (Temporal)    Ht 5' 5" (1.651 m)    Wt 157 lb 8 oz (71.4 kg)    SpO2 97%    BMI 26.21 kg/m    CC:  Chief Complaint  Patient presents with   Follow-up    MDD & Memory    Subjective:   HPI: Tonya [name] is a 81 y.o. female presenting on 10/17/2021 for Follow-up (MDD & Memory)  At [facility] in 08/2021 we discussed MDD and memory loss Felt mood was in part contributing to her memory issues although  most significant part of memory loss likely due to vascular dementia.  Increased  celexa to 40 mg daily ( from 20 mg)  Today's PHQ 0 per report.   She states she feels better overall. She is feeling more positive. Less crying spells. Less anger.  Daughter still decreased appetite, does not do much. Sleeping more overall...  No SE to celexa no GI SE.  Wt Readings from Last 3 Encounters:  10/17/21 157 lb 8 oz (71.4 kg)  10/14/21 156 lb (70.8 kg)  09/05/21 161 lb 4 oz (73.1 kg)       Depression screen [facility] 2/9 09/05/2021 08/14/2021 07/11/2020  Decreased Interest 3 0 0  Down, Depressed, Hopeless 1 0 0  PHQ - 2 Score 4 0 0  Altered sleeping 0 - 0  Tired, decreased energy 3 - 0  Change in appetite 2 - 0  Feeling bad or failure about yourself  3 - 0  Trouble concentrating 0 - 0  Moving slowly or fidgety/restless 0 - 0  Suicidal thoughts 1 - 0  PHQ-9 Score 13 - 0  Difficult doing work/chores Somewhat difficult - -  Some recent data might be hidden     Ischemic vascular dementia per MRI:  MRI angio 04/23/2021 also showed  1. 1.2 cm saccular aneurysm arising from the proximal cavernous right ICA. This is directed medially and slightly posteriorly, and corresponds with the abnormality on prior brain MRI.   Daughter and patient have decided that they do not want to see vascular MD as they would not want to treat this  area ever with surgery.  Pt states memory is stable but daughter states she has noted some issues with recognizing names, possible slow decline. Some trouble with more executive functioning.. filling out form.  Was unable to administer a recent COVID test.     Daughter has HPOA and DPR form  She has living will completed.  Relevant past medical, surgical, family and social history reviewed and updated as indicated. Interim medical history since our last visit reviewed. Allergies and medications reviewed and updated. Outpatient Medications Prior to Visit  Medication Sig Dispense Refill   apixaban (ELIQUIS) 2.5 MG TABS tablet Take 1 tablet (2.5 mg total) by mouth 2 (two) times daily. 60 tablet 11   atorvastatin (LIPITOR) 20 MG tablet TAKE 1 TABLET BY MOUTH EVERY DAY 90 tablet 1   Blood Glucose Monitoring Suppl (ACCU-CHEK AVIVA PLUS) w/Device KIT Use to check blood once sugar daily. 1 kit 0   carvedilol (COREG) 12.5 MG tablet TAKE 1 TABLET (12.5 MG TOTAL) BY MOUTH 2 (TWO) TIMES DAILY WITH A MEAL. 180 tablet 0   cetirizine (ZYRTEC) 10 MG tablet Take  10 mg by mouth at bedtime.     cholestyramine light (PREVALITE) 4 g packet USE 1 PACKET TWICE DAILY WITH A MEAL, AT LEAST 2 HOURS AWAY FROM OTHER MEDS  Patient needs an appointment for further refills. 540 packet 0   citalopram (CELEXA) 40 MG tablet TAKE 1 TABLET BY MOUTH EVERY DAY 90 tablet 1   diltiazem (CARDIZEM CD) 180 MG 24 hr capsule TAKE 1 CAPSULE BY MOUTH EVERY DAY 90 capsule 0   ferrous sulfate 325 (65 FE) MG EC tablet Take 325 mg by mouth 2 (two) times daily.     glucose blood (ACCU-CHEK AVIVA PLUS) test strip Use to check blood sugar daily. 100 each 3   isosorbide mononitrate (IMDUR) 30 MG 24 hr tablet TAKE 1 TABLET BY MOUTH EVERY DAY 90 tablet 2   losartan (COZAAR) 100 MG tablet Take 1 tablet (100 mg total) by mouth daily. 90 tablet 3   omeprazole (PRILOSEC) 20 MG capsule TAKE 1 CAPSULE BY MOUTH EVERY DAY 90 capsule 3   Vitamin D,  Ergocalciferol, (DRISDOL) 1.25 MG (50000 UNIT) CAPS capsule TAKE 1 CAPSULE (50,000 UNITS TOTAL) BY MOUTH EVERY 7 (SEVEN) DAYS 12 capsule 0   No facility-administered medications prior to visit.     Per HPI unless specifically indicated in ROS section below Review of Systems  Constitutional:  Negative for fatigue and fever.  HENT:  Negative for congestion.   Eyes:  Negative for pain.  Respiratory:  Negative for cough and shortness of breath.   Cardiovascular:  Negative for chest pain, palpitations and leg swelling.  Gastrointestinal:  Negative for abdominal pain.  Genitourinary:  Negative for dysuria and vaginal bleeding.  Musculoskeletal:  Negative for back pain.  Neurological:  Negative for syncope, light-headedness and headaches.  Psychiatric/Behavioral:  Negative for dysphoric mood.   Objective:  BP 120/72    Pulse 98    Temp 97.6 F (36.4 C) (Temporal)    Ht 5' 5" (1.651 m)    Wt 157 lb 8 oz (71.4 kg)    SpO2 97%    BMI 26.21 kg/m   Wt Readings from Last 3 Encounters:  10/17/21 157 lb 8 oz (71.4 kg)  10/14/21 156 lb (70.8 kg)  09/05/21 161 lb 4 oz (73.1 kg)      Physical Exam Constitutional:      General: She is not in acute distress.    Appearance: Normal appearance. She is well-developed. She is not ill-appearing or toxic-appearing.  HENT:     Head: Normocephalic.     Right Ear: Hearing, tympanic membrane, ear canal and external ear normal. Tympanic membrane is not erythematous, retracted or bulging.     Left Ear: Hearing, tympanic membrane, ear canal and external ear normal. Tympanic membrane is not erythematous, retracted or bulging.     Nose: No mucosal edema or rhinorrhea.     Right Sinus: No maxillary sinus tenderness or frontal sinus tenderness.     Left Sinus: No maxillary sinus tenderness or frontal sinus tenderness.     Mouth/Throat:     Pharynx: Uvula midline.  Eyes:     General: Lids are normal. Lids are everted, no foreign bodies appreciated.      Conjunctiva/sclera: Conjunctivae normal.     Pupils: Pupils are equal, round, and reactive to light.  Neck:     Thyroid: No thyroid mass or thyromegaly.     Vascular: No carotid bruit.     Trachea: Trachea normal.  Cardiovascular:     Rate and  Rhythm: Normal rate and regular rhythm.     Pulses: Normal pulses.     Heart sounds: Normal heart sounds, S1 normal and S2 normal. No murmur heard.   No friction rub. No gallop.  Pulmonary:     Effort: Pulmonary effort is normal. No tachypnea or respiratory distress.     Breath sounds: Normal breath sounds. No decreased breath sounds, wheezing, rhonchi or rales.  Abdominal:     General: Bowel sounds are normal.     Palpations: Abdomen is soft.     Tenderness: There is no abdominal tenderness.  Musculoskeletal:     Cervical back: Normal range of motion and neck supple.  Skin:    General: Skin is warm and dry.     Findings: No rash.  Neurological:     Mental Status: She is alert.  Psychiatric:        Mood and Affect: Mood is not anxious or depressed.        Speech: Speech normal.        Behavior: Behavior normal. Behavior is cooperative.        Thought Content: Thought content normal.        Judgment: Judgment normal.      Results for orders placed or performed in visit on 09/10/21  CBC with Differential  Result Value Ref Range   WBC 9.8 3.8 - 10.8 Thousand/uL   RBC 4.18 3.80 - 5.10 Million/uL   Hemoglobin 13.5 11.7 - 15.5 g/dL   HCT 39.1 35.0 - 45.0 %   MCV 93.5 80.0 - 100.0 fL   MCH 32.3 27.0 - 33.0 pg   MCHC 34.5 32.0 - 36.0 g/dL   RDW 12.0 11.0 - 15.0 %   Platelets 193 140 - 400 Thousand/uL   MPV 11.6 7.5 - 12.5 fL   Neutro Abs 7,301 1,500 - 7,800 cells/uL   Lymphs Abs 1,676 850 - 3,900 cells/uL   Absolute Monocytes 578 200 - 950 cells/uL   Eosinophils Absolute 167 15 - 500 cells/uL   Basophils Absolute 78 0 - 200 cells/uL   Neutrophils Relative % 74.5 %   Total Lymphocyte 17.1 %   Monocytes Relative 5.9 %   Eosinophils  Relative 1.7 %   Basophils Relative 0.8 %  Sed Rate (ESR)  Result Value Ref Range   Sed Rate 2 0 - 30 mm/h  C-reactive protein  Result Value Ref Range   CRP 0.2 <8.0 mg/L    This visit occurred during the SARS-CoV-2 public health emergency.  Safety protocols were in place, including screening questions prior to the visit, additional usage of staff PPE, and extensive cleaning of exam room while observing appropriate contact time as indicated for disinfecting solutions.   COVID 19 screen:  No recent travel or known exposure to COVID19 The patient denies respiratory symptoms of COVID 19 at this time. The importance of social distancing was discussed today.   Assessment and Plan    Problem List Items Addressed This Visit     Ischemic vascular dementia ([place]) (Chronic)    Gradually worsening. Treating risk factors. Offered neuro referral or trial of cholinesterase inhibitor.. patient and daughter are not interested in either. Reviewed likely progression. Pt currently able to perform ADLs.Marland Kitchen lives with daughter. No behavior issues at this time. No wandering.   Daughter has complete living will for patient and has HCPOA. She has DPR on file in our office.      Major depressive disorder, recurrent episode, moderate (HCC) - Primary (Chronic)  Significant improvement  On celexa 40 mg daily with no SE. NO improvement in memory though.      Other Visit Diagnoses     B12 deficiency       Relevant Medications   cyanocobalamin ((VITAMIN B-12)) injection 1,000 mcg (Completed)        [name], MD

## 2021-10-17 NOTE — Assessment & Plan Note (Signed)
Gradually worsening. Treating risk factors. Offered neuro referral or trial of cholinesterase inhibitor.. patient and daughter are not interested in either. Reviewed likely progression. Pt currently able to perform ADLs.Marland Kitchen lives with daughter. No behavior issues at this time. No wandering.   Daughter has complete living will for patient and has HCPOA. She has DPR on file in our office.

## 2021-10-17 NOTE — Patient Instructions (Signed)
Work on Futures trader!  Work on trying ne activities to stimulate thinking!  Regular exercise helps with memory.

## 2021-10-17 NOTE — Assessment & Plan Note (Signed)
Significant improvement  On celexa 40 mg daily with no SE. NO improvement in memory though.

## 2021-11-01 ENCOUNTER — Other Ambulatory Visit: Payer: Self-pay | Admitting: Family Medicine

## 2021-11-01 ENCOUNTER — Other Ambulatory Visit: Payer: Self-pay | Admitting: Cardiology

## 2021-11-01 DIAGNOSIS — I4891 Unspecified atrial fibrillation: Secondary | ICD-10-CM

## 2021-11-01 DIAGNOSIS — I4821 Permanent atrial fibrillation: Secondary | ICD-10-CM

## 2021-11-02 NOTE — Telephone Encounter (Signed)
Last office visit 10/17/21 for follow up MDD/Memory.  Last refilled 08/12/21 for #12 with no refills.  Last Vit D level 03/28/21 which was normal at 31.15 ng/ml.  Next Appt: 03/24/22 for DM.

## 2021-11-13 ENCOUNTER — Other Ambulatory Visit: Payer: Self-pay | Admitting: Family Medicine

## 2021-11-14 ENCOUNTER — Telehealth: Payer: Self-pay

## 2021-11-14 NOTE — Telephone Encounter (Signed)
[place] Night - Client TELEPHONE ADVICE RECORD [facility] Patient Name: Tonya [name] [facility] Gender: Female DOB: Nov 10, 1940 Age: 81 Y 79 M 21 D Return Phone Number: Address: City/ State/ Zip: [place] Night - Client Client Site [place] Provider [name] - MD Contact Type Call Who Is Calling Patient / Member / Family / Caregiver Call Type Triage / Clinical Relationship To Patient Self Return Phone Number Please choose phone number Chief Complaint Prescription Refill or Medication Request (non symptomatic) Reason for Call Medication Question / Request Initial Comment Caller states the pharmacy is needing an authorization for vitamin d2 prescription because her Dr did not send that in. Caller does not know her exact zip code, or phone number. Translation No Disp. Time Tonya [name] Time) Disposition Final User 11/13/2021 6:48:32 PM Send To RN Personal Tawni Pummel, RN, Greg 11/13/2021 6:48:42 PM Send To RN Personal Tawni Pummel, RN, Greg 11/13/2021 6:49:09 PM Send To RN Personal Tawni Pummel, RN, Greg 11/13/2021 6:51:30 PM Clinical Call Yes Donna Christen, RN, Legrand Como Comments User: Tresa Moore, RN Date/Time Tonya [name] Time): 11/13/2021 6:51:12 PM Caller not waiting in holding Q has already disconnected and no call back number give

## 2021-11-17 NOTE — Telephone Encounter (Signed)
Per chart, refill was sent on 11/04/21, #12/0.  Spoke with [facility] asking if pt picked up refill.  Cristie Hem confirms pt picked up refill on 11/09/21.

## 2021-11-26 NOTE — Progress Notes (Signed)
Cardiology Office Note:    Date:  11/27/2021   ID:  Tonya [name], DOB 1941/05/28, MRN [id number]  PCP:  [name], MD [place] Cardiologist: Minus Breeding, MD    Reason for visit: 6 month follow-up  History of Present Illness:    Tonya [name] is a 81 y.o. female with a hx of atrial fibrillation, 1.2 cm saccular aneurysm of the proximal cavernous right ICA, CAD with LAD stent, CKD and HTN.    She last saw Dr. Percival Spanish in 05/2021 and mentioned increased shortness of breath which her family notices climbing the stairs.  Lexiscan was ordered.  Today, she feels well.  Her daughter comes in since the patient has some memory loss.  Shortness of breath on moderate exertion unchanged.  She is able to go up the stairs without stopping.  Her daughter thinks this is likely secondary to inactivity.  She does not do much except walk to the mailbox and go upstairs to visit her daughter/do laundry.  She denies chest pain.  No bleeding with Eliquis.  No palpitations.  Mentions some lightheadedness if she gets up too quickly.    Past Medical History:  Diagnosis Date   Adenocarcinoma, colon ([place])    Anemia    iron deficiency   Anxiety    Carpal tunnel syndrome    Cataract    REMOVED BILATERAL   Chronic diarrhea    Colon polyps    Tubular adenoma   Degenerative disk disease    Depression    Diabetes mellitus, type 2 ([place])    Dysrhythmia    h/o AFIB   GERD (gastroesophageal reflux disease)    History of blood transfusion    After surgeries   Hyperlipidemia    Hypertension    IBS (irritable bowel syndrome)    Kidney disorder    Osteoarthritis    Osteoporosis    PONV (postoperative nausea and vomiting)    S/P coronary artery stent placement 2007    Past Surgical History:  Procedure Laterality Date   CARDIAC CATHETERIZATION  12/10   LAD stent patent. Insignificant CAD, otherwise EF 60-65%   CARPAL TUNNEL RELEASE     CATARACT EXTRACTION  03/2015   CHOLECYSTECTOMY      COLON SURGERY     COLONOSCOPY     DILATION AND CURETTAGE OF UTERUS     HEMATOMA EVACUATION     after knee replacement   KNEE ARTHROSCOPY     bilateral   LUMBAR LAMINECTOMY N/A 11/26/2015   Procedure: LUMBAR DECOMPRESSIVE LAMINECTOMY L3-4 AND L4-5, LEFT L2-3 HEMILAMINECTOMY;  Surgeon: Jessy Oto, MD;  Location: [facility];  Service: Orthopedics;  Laterality: N/A;   ORIF ELBOW FRACTURE Left 03/18/2021   Procedure: OPEN REDUCTION INTERNAL FIXATION (ORIF) ELBOW/OLECRANON FRACTURE;  Surgeon: Renette Butters, MD;  [facility];  Service: Orthopedics;  Laterality: Left;   PARTIAL COLECTOMY  11/2008   right, for adenocarcinoma   REPLACEMENT TOTAL KNEE  03/5169   right, complicated by hemarthrosis    REVERSE SHOULDER ARTHROPLASTY Right 05/28/2016   REVERSE SHOULDER ARTHROPLASTY Right 05/28/2016   Procedure: REVERSE SHOULDER ARTHROPLASTY;  Surgeon: Meredith Pel, MD;  Location: [facility];  Service: Orthopedics;  Laterality: Right;   TONSILLECTOMY     TOTAL KNEE ARTHROPLASTY Left 04/03/2014   dr whitfield   TOTAL KNEE ARTHROPLASTY Left 04/03/2014   Procedure: TOTAL KNEE ARTHROPLASTY;  Surgeon: Garald Balding, MD;  Location: [facility];  Service: Orthopedics;  Laterality: Left;  TOTAL KNEE REVISION  03/08/2012   Procedure: TOTAL KNEE REVISION;  Surgeon: Garald Balding, MD;  Location: [facility];  Service: Orthopedics;  Laterality: Right;  removal total knee hardware and placement of antibiotic cement spacer and antibiotic beads   TOTAL KNEE REVISION  05/17/2012   Procedure: TOTAL KNEE REVISION;  Surgeon: Garald Balding, MD;  Location: [facility];  Service: Orthopedics;  Laterality: Right;  right total knee revision, removal of antibiotic spacer    Current Medications: Current Meds  Medication Sig   atorvastatin (LIPITOR) 20 MG tablet TAKE 1 TABLET BY MOUTH EVERY DAY   Blood Glucose Monitoring Suppl (ACCU-CHEK AVIVA PLUS) w/Device KIT Use to check blood once sugar daily.   carvedilol (COREG) 12.5 MG  tablet TAKE 1 TABLET (12.5MG TOTAL) BY MOUTH TWICE A DAY WITH MEALS   cetirizine (ZYRTEC) 10 MG tablet Take 10 mg by mouth at bedtime.   cholestyramine light (PREVALITE) 4 g packet USE 1 PACKET TWICE DAILY WITH A MEAL, AT LEAST 2 HOURS AWAY FROM OTHER MEDS  Patient needs an appointment for further refills.   citalopram (CELEXA) 40 MG tablet TAKE 1 TABLET BY MOUTH EVERY DAY   diltiazem (CARDIZEM CD) 180 MG 24 hr capsule TAKE 1 CAPSULE BY MOUTH EVERY DAY   ELIQUIS 2.5 MG TABS tablet TAKE 1 TABLET BY MOUTH TWICE A DAY   ferrous sulfate 325 (65 FE) MG EC tablet Take 325 mg by mouth 2 (two) times daily.   glucose blood (ACCU-CHEK AVIVA PLUS) test strip Use to check blood sugar daily.   isosorbide mononitrate (IMDUR) 30 MG 24 hr tablet TAKE 1 TABLET BY MOUTH EVERY DAY   losartan (COZAAR) 100 MG tablet Take 1 tablet (100 mg total) by mouth daily.   omeprazole (PRILOSEC) 20 MG capsule TAKE 1 CAPSULE BY MOUTH EVERY DAY   Vitamin D, Ergocalciferol, (DRISDOL) 1.25 MG (50000 UNIT) CAPS capsule TAKE 1 CAPSULE (50,000 UNITS TOTAL) BY MOUTH EVERY 7 (SEVEN) DAYS     Allergies:   Amoxicillin-pot clavulanate   Social History   Socioeconomic History   Marital status: Divorced    Spouse name: Not on file   Number of children: 3   Years of education: Not on file   Highest education level: Not on file  Occupational History   Occupation: retired    Fish farm manager: RETIRED  Tobacco Use   Smoking status: Never   Smokeless tobacco: Never  Vaping Use   Vaping Use: Never used  Substance and Sexual Activity   Alcohol use: No    Alcohol/week: 0.0 standard drinks   Drug use: No   Sexual activity: Never  Other Topics Concern   Not on file  Social History Narrative   No regular exercise, limited due to knees   Diet- addicted to sweets, some fruit and veggies, water daily.   Social Determinants of Health   Financial Resource Strain: Low Risk    Difficulty of Paying Living Expenses: Not hard at all  Food  Insecurity: No Food Insecurity   Worried About Charity fundraiser in the Last Year: Never true   [place] in the Last Year: Never true  Transportation Needs: No Transportation Needs   Lack of Transportation (Medical): No   Lack of Transportation (Non-Medical): No  Physical Activity: Inactive   Days of Exercise per Week: 0 days   Minutes of Exercise per Session: 0 min  Stress: No Stress Concern Present   Feeling of Stress : Not at all  Social Connections: Moderately Isolated   Frequency of Communication with Friends and Family: Once a week   Frequency of Social Gatherings with Friends and Family: Twice a week   Attends Religious Services: More than 4 times per year   Active Member of [employer] or Organizations: No   Attends Archivist Meetings: Never   Marital Status: Widowed     Family History: The patient's family history includes Colon polyps in her sister; Diabetes in her paternal grandmother; Heart attack in her mother; Heart disease in her father and mother; Prostate cancer in her father. There is no history of Colon cancer, Esophageal cancer, Pancreatic cancer, Stomach cancer, or Liver disease.  ROS:   Please see the history of present illness.     EKGs/Labs/Other Studies Reviewed:    EKG:  The ekg ordered today demonstrates atrial fibrillation.  Recent Labs: 03/28/2021: TSH 2.80 06/20/2021: BNP 217.1 09/05/2021: ALT 19; BUN 28; Creatinine, Ser 1.56; Potassium 4.7; Sodium 141 09/10/2021: Hemoglobin 13.5; Platelets 193   Recent Lipid Panel Lab Results  Component Value Date/Time   CHOL 110 09/05/2021 10:33 AM   TRIG 165.0 (H) 09/05/2021 10:33 AM   TRIG 240 (HH) 09/07/2006 08:49 AM   HDL 42.30 09/05/2021 10:33 AM   LDLCALC 35 09/05/2021 10:33 AM   LDLDIRECT 50.0 10/22/2014 08:41 AM    Physical Exam:    VS:  BP 130/82    Pulse 81    Ht 5' 5" (1.651 m)    Wt 157 lb 12.8 oz (71.6 kg)    SpO2 98%    BMI 26.26 kg/m    No data found.  Wt Readings from Last  3 Encounters:  11/27/21 157 lb 12.8 oz (71.6 kg)  10/17/21 157 lb 8 oz (71.4 kg)  10/14/21 156 lb (70.8 kg)     GEN:  Well nourished, well developed in no acute distress HEENT: Normal NECK: No JVD; No carotid bruits CARDIAC: irreg irreg no murmurs, rubs, gallops RESPIRATORY:  Clear to auscultation without rales, wheezing or rhonchi  ABDOMEN: Soft, non-tender, non-distended MUSCULOSKELETAL: trace L ankle edema; No deformity  SKIN: Warm and dry NEUROLOGIC:  Alert and oriented PSYCHIATRIC:  Normal affect     ASSESSMENT AND PLAN   Dyspnea with moderate exertion -Lexiscan 06/2021: no evidence of ischemia  -Echo 2019 with EF 55-60%  -No heart failure symptoms.  Euvolemic on exam.  EKG without ischemic changes.  Heart rate controlled (81) on palpation. -Encouraged regular activity.  Coronary artery disease without angina -History of stent to the LAD, the daughter thinks this was in the early 2000's -No aspirin secondary to need for anticoagulation. -Continue Lipitor and beta-blocker.  Continue Imdur - may consider d/c in the future if hypotensive /increased lightheadednness.    Atrial fibrillation, rate controlled -Continue Eliquis 2.5 mg twice daily for stroke prevention.  On lower dose given age and Cr >1.5. -Continue Coreg and Cardizem for rate control.  HR 81 on palpation.  Hypertension, well controlled -Continue current medications.  Hyperlipidemia -Lipids December 2022 with LDL 35. -Continue Lipitor.  Disposition - Follow-up in 1 year with Dr. Percival Spanish.   Medication Adjustments/Labs and Tests Ordered: Current medicines are reviewed at length with the patient today.  Concerns regarding medicines are outlined above.  Orders Placed This Encounter  Procedures   EKG 12-Lead   No orders of the defined types were placed in this encounter.   Patient Instructions  Medication Instructions:  No Chnages *If you need a refill on your cardiac medications  before your next  appointment, please call your pharmacy*   Lab Work: No Labs If you have labs (blood work) drawn today and your tests are completely normal, you will receive your results only by: [place] (if you have MyChart) OR A paper copy in the mail If you have any lab test that is abnormal or we need to change your treatment, we will call you to review the results.   Testing/Procedures: No Testing   Follow-Up: At [facility], you and your health needs are our priority.  As part of our continuing mission to provide you with exceptional heart care, we have created designated Provider Care Teams.  These Care Teams include your primary Cardiologist (physician) and Advanced Practice Providers (APPs -  Physician Assistants and Nurse Practitioners) who all work together to provide you with the care you need, when you need it.  We recommend signing up for the patient portal called "MyChart".  Sign up information is provided on this After Visit Summary.  MyChart is used to connect with patients for Virtual Visits (Telemedicine).  Patients are able to view lab/test results, encounter notes, upcoming appointments, etc.  Non-urgent messages can be sent to your provider as well.   To learn more about what you can do with MyChart, go to [email or web address].    Your next appointment:   1 year(s)  The format for your next appointment:   In Person  Provider:   Minus Breeding, MD         Signed, Warren Lacy, PA-C  11/27/2021 9:48 AM    [place]

## 2021-11-27 ENCOUNTER — Encounter: Payer: Self-pay | Admitting: Physician Assistant

## 2021-11-27 ENCOUNTER — Other Ambulatory Visit: Payer: Self-pay

## 2021-11-27 ENCOUNTER — Ambulatory Visit: Payer: Medicare Other | Admitting: Physician Assistant

## 2021-11-27 VITALS — BP 130/82 | HR 81 | Ht 65.0 in | Wt 157.8 lb

## 2021-11-27 DIAGNOSIS — R0602 Shortness of breath: Secondary | ICD-10-CM

## 2021-11-27 DIAGNOSIS — I2510 Atherosclerotic heart disease of native coronary artery without angina pectoris: Secondary | ICD-10-CM | POA: Diagnosis not present

## 2021-11-27 DIAGNOSIS — I10 Essential (primary) hypertension: Secondary | ICD-10-CM | POA: Diagnosis not present

## 2021-11-27 DIAGNOSIS — I4891 Unspecified atrial fibrillation: Secondary | ICD-10-CM | POA: Diagnosis not present

## 2021-11-27 NOTE — Patient Instructions (Signed)
Medication Instructions:  ?No Chnages ?*If you need a refill on your cardiac medications before your next appointment, please call your pharmacy* ? ? ?Lab Work: ?No Labs ?If you have labs (blood work) drawn today and your tests are completely normal, you will receive your results only by: ?MyChart Message (if you have MyChart) OR ?A paper copy in the mail ?If you have any lab test that is abnormal or we need to change your treatment, we will call you to review the results. ? ? ?Testing/Procedures: ?No Testing ? ? ?Follow-Up: ?At [facility], you and your health needs are our priority.  As part of our continuing mission to provide you with exceptional heart care, we have created designated Provider Care Teams.  These Care Teams include your primary Cardiologist (physician) and Advanced Practice Providers (APPs -  Physician Assistants and Nurse Practitioners) who all work together to provide you with the care you need, when you need it. ? ?We recommend signing up for the patient portal called "MyChart".  Sign up information is provided on this After Visit Summary.  MyChart is used to connect with patients for Virtual Visits (Telemedicine).  Patients are able to view lab/test results, encounter notes, upcoming appointments, etc.  Non-urgent messages can be sent to your provider as well.   ?To learn more about what you can do with MyChart, go to [email or web address].   ? ?Your next appointment:   ?1 year(s) ? ?The format for your next appointment:   ?In Person ? ?Provider:   ?Minus Breeding, MD   ? ? ?  ?

## 2021-11-29 ENCOUNTER — Other Ambulatory Visit: Payer: Self-pay | Admitting: Family Medicine

## 2021-12-10 ENCOUNTER — Other Ambulatory Visit: Payer: Self-pay

## 2021-12-10 ENCOUNTER — Ambulatory Visit: Payer: Medicare Other | Admitting: Orthopedic Surgery

## 2021-12-10 ENCOUNTER — Encounter: Payer: Self-pay | Admitting: Orthopedic Surgery

## 2021-12-10 DIAGNOSIS — Z96611 Presence of right artificial shoulder joint: Secondary | ICD-10-CM | POA: Diagnosis not present

## 2021-12-10 NOTE — Progress Notes (Signed)
? ?Office Visit Note ?  ?Patient: Tonya [name]           ?Date of Birth: 11-26-1940           ?MRN: [id number] ?Visit Date: 12/10/2021 ?Requested by: [name], MD ?[address] ?[place],  [place] [address] ?PCP: [name], MD ? ?Subjective: ?Chief Complaint  ?Patient presents with  ? Right Shoulder - Follow-up  ? ? ?HPI: Tonya [name] is an 81 year old patient who is now about 6 years out reverse replacement on the right.  Doing some better.  Still is having some occasional anterior shoulder discomfort.  Denies any weakness fevers or chills.  Pain does not wake her from sleep at night.  She is able to sleep on the right-hand side. ?             ?ROS: All systems reviewed are negative as they relate to the chief complaint within the history of present illness.  Patient denies  fevers or chills. ? ? ?Assessment & Plan: ?Visit Diagnoses:  ?1. History of arthroplasty of right shoulder   ? ? ?Plan: Impression is mild pain right shoulder with pretty good functional strength as well as normal CT scan and no concern for infection at this time.  I would favor observation for now.  Symptoms are not really debilitating and she does have good functional activity in the shoulder.  Follow-up as needed.  Did tell her the red flag symptoms would be sudden increase in pain or fevers or chills or any issue with the incision. ? ?Follow-Up Instructions: Return if symptoms worsen or fail to improve.  ? ?Orders:  ?No orders of the defined types were placed in this encounter. ? ?No orders of the defined types were placed in this encounter. ? ? ? ? Procedures: ?No procedures performed ? ? ?Clinical Data: ?No additional findings. ? ?Objective: ?Vital Signs: There were no vitals taken for this visit. ? ?Physical Exam:  ? ?Constitutional: Patient appears well-developed ?HEENT:  ?Head: Normocephalic ?Eyes:EOM are normal ?Neck: Normal range of motion ?Cardiovascular: Normal rate ?Pulmonary/chest: Effort normal ?Neurologic: Patient is  alert ?Skin: Skin is warm ?Psychiatric: Patient has normal mood and affect ? ? ?Ortho Exam: Ortho exam demonstrates good cervical spine range of motion.  Right shoulder has range of motion of 45/90/120.  Rotator cuff strength is pretty reasonable supraspinatus and subscap and infraspinatus testing.  Incision is not warm.  Incision intact.  No other masses lymphadenopathy or skin changes noted in that right shoulder girdle region. ? ?Specialty Comments:  ?No specialty comments available. ? ?Imaging: ?No results found. ? ? ?PMFS History: ?Patient Active Problem List  ? Diagnosis Date Noted  ? Saccular aneurysm 09/05/2021  ? Secondary hyperparathyroidism ([place]) 04/11/2021  ? Ischemic vascular dementia ([place]) 04/11/2021  ? Secondary hypoparathyroidism ([place]) 04/08/2021  ? Left leg swelling 03/05/2020  ? Hypertension associated with diabetes ([place]) 01/09/2020  ? Lightheadedness 01/09/2020  ? Plantar fasciitis, left 12/13/2019  ? Acquired trigger finger of left ring finger 07/11/2019  ? Osteoarthritis of both wrists 07/11/2019  ? Coronary artery disease involving native coronary artery of native heart without angina pectoris 10/24/2018  ? Senile purpura ([place]) 03/01/2018  ? History of total replacement of right shoulder joint 11/06/2016  ? Anemia of chronic disease 11/29/2015  ? Spinal stenosis, lumbar region, with neurogenic claudication 11/26/2015  ?  Class: Chronic  ? Mixed incontinence urge and stress 10/29/2015  ? CKD stage 4 due to type 2 diabetes mellitus (  [place]) 06/25/2015  ? Counseling regarding end of life decision making 10/26/2014  ? S/P total knee replacement using cement 04/03/2014  ? Atrial fibrillation ([place]) 10/17/2010  ? Allergic rhinitis 01/23/2009  ? ADENOCARCINOMA, COLON, CECUM 10/05/2008  ? Coronary atherosclerosis 11/23/2007  ? ANXIETY 09/19/2007  ? Major depressive disorder, recurrent episode, moderate ([place]) 09/19/2007  ? GERD 09/19/2007  ? Controlled type 2 diabetes mellitus with diabetic nephropathy  ([place]) 03/21/2007  ? SYNDROME, CARPAL TUNNEL 03/21/2007  ? Hyperlipidemia associated with type 2 diabetes mellitus ([place]) 03/21/2007  ? IBS 03/21/2007  ? Osteoarthrosis, unspecified whether generalized or localized, unspecified site 03/21/2007  ? ?Past Medical History:  ?Diagnosis Date  ? Adenocarcinoma, colon ([place])   ? Anemia   ? iron deficiency  ? Anxiety   ? Carpal tunnel syndrome   ? Cataract   ? REMOVED BILATERAL  ? Chronic diarrhea   ? Colon polyps   ? Tubular adenoma  ? Degenerative disk disease   ? Depression   ? Diabetes mellitus, type 2 ([place])   ? Dysrhythmia   ? h/o AFIB  ? GERD (gastroesophageal reflux disease)   ? History of blood transfusion   ? After surgeries  ? Hyperlipidemia   ? Hypertension   ? IBS (irritable bowel syndrome)   ? Kidney disorder   ? Osteoarthritis   ? Osteoporosis   ? PONV (postoperative nausea and vomiting)   ? S/P coronary artery stent placement 2007  ?  ?Family History  ?Problem Relation Age of Onset  ? Heart disease Mother   ?     massive MI age 57  ? Heart attack Mother   ? Heart disease Father   ?     Massive MI age 3  ? Prostate cancer Father   ? Colon polyps Sister   ? Diabetes Paternal Grandmother   ? Colon cancer Neg Hx   ? Esophageal cancer Neg Hx   ? Pancreatic cancer Neg Hx   ? Stomach cancer Neg Hx   ? Liver disease Neg Hx   ?  ?Past Surgical History:  ?Procedure Laterality Date  ? CARDIAC CATHETERIZATION  12/10  ? LAD stent patent. Insignificant CAD, otherwise EF 60-65%  ? CARPAL TUNNEL RELEASE    ? CATARACT EXTRACTION  03/2015  ? CHOLECYSTECTOMY    ? COLON SURGERY    ? COLONOSCOPY    ? DILATION AND CURETTAGE OF UTERUS    ? HEMATOMA EVACUATION    ? after knee replacement  ? KNEE ARTHROSCOPY    ? bilateral  ? LUMBAR LAMINECTOMY N/A 11/26/2015  ? Procedure: LUMBAR DECOMPRESSIVE LAMINECTOMY L3-4 AND L4-5, LEFT L2-3 HEMILAMINECTOMY;  Surgeon: Jessy Oto, MD;  Location: [place];  Service: Orthopedics;  Laterality: N/A;  ? ORIF ELBOW FRACTURE Left 03/18/2021  ? Procedure:  OPEN REDUCTION INTERNAL FIXATION (ORIF) ELBOW/OLECRANON FRACTURE;  Surgeon: Renette Butters, MD;  [facility];  Service: Orthopedics;  Laterality: Left;  ? PARTIAL COLECTOMY  11/2008  ? right, for adenocarcinoma  ? REPLACEMENT TOTAL KNEE  04/2010  ? right, complicated by hemarthrosis   ? REVERSE SHOULDER ARTHROPLASTY Right 05/28/2016  ? REVERSE SHOULDER ARTHROPLASTY Right 05/28/2016  ? Procedure: REVERSE SHOULDER ARTHROPLASTY;  Surgeon: Meredith Pel, MD;  Location: [place];  Service: Orthopedics;  Laterality: Right;  ? TONSILLECTOMY    ? TOTAL KNEE ARTHROPLASTY Left 04/03/2014  ? dr whitfield  ? TOTAL KNEE ARTHROPLASTY Left 04/03/2014  ? Procedure: TOTAL KNEE ARTHROPLASTY;  Surgeon: Garald Balding, MD;  Location:  [facility];  Service: Orthopedics;  Laterality: Left;  ? TOTAL KNEE REVISION  03/08/2012  ? Procedure: TOTAL KNEE REVISION;  Surgeon: Garald Balding, MD;  Location: [place];  Service: Orthopedics;  Laterality: Right;  removal total knee hardware and placement of antibiotic cement spacer and antibiotic beads  ? TOTAL KNEE REVISION  05/17/2012  ? Procedure: TOTAL KNEE REVISION;  Surgeon: Garald Balding, MD;  Location: [place];  Service: Orthopedics;  Laterality: Right;  right total knee revision, removal of antibiotic spacer  ? ?Social History  ? ?Occupational History  ? Occupation: retired  ?  Employer: RETIRED  ?Tobacco Use  ? Smoking status: Never  ? Smokeless tobacco: Never  ?Vaping Use  ? Vaping Use: Never used  ?Substance and Sexual Activity  ? Alcohol use: No  ?  Alcohol/week: 0.0 standard drinks  ? Drug use: No  ? Sexual activity: Never  ? ? ? ? ? ?

## 2021-12-11 DIAGNOSIS — N184 Chronic kidney disease, stage 4 (severe): Secondary | ICD-10-CM | POA: Diagnosis not present

## 2021-12-11 DIAGNOSIS — I10 Essential (primary) hypertension: Secondary | ICD-10-CM | POA: Diagnosis not present

## 2021-12-11 DIAGNOSIS — N1832 Chronic kidney disease, stage 3b: Secondary | ICD-10-CM | POA: Diagnosis not present

## 2021-12-11 DIAGNOSIS — N2581 Secondary hyperparathyroidism of renal origin: Secondary | ICD-10-CM | POA: Diagnosis not present

## 2021-12-11 DIAGNOSIS — E1122 Type 2 diabetes mellitus with diabetic chronic kidney disease: Secondary | ICD-10-CM | POA: Diagnosis not present

## 2021-12-12 DIAGNOSIS — N2581 Secondary hyperparathyroidism of renal origin: Secondary | ICD-10-CM | POA: Diagnosis not present

## 2021-12-12 DIAGNOSIS — N184 Chronic kidney disease, stage 4 (severe): Secondary | ICD-10-CM | POA: Diagnosis not present

## 2021-12-12 DIAGNOSIS — R8290 Unspecified abnormal findings in urine: Secondary | ICD-10-CM | POA: Diagnosis not present

## 2021-12-20 ENCOUNTER — Other Ambulatory Visit: Payer: Self-pay | Admitting: Family Medicine

## 2021-12-21 NOTE — Telephone Encounter (Signed)
Last office visit 10/17/21 for MDD & Memory.  Last refilled 11/04/21 for #12 with no refills.  Last Vit D level 03/28/21 which was normal at 31/15 ng/ml.  Next Appt: 03/24/22 for DM.  ?

## 2021-12-23 ENCOUNTER — Emergency Department: Payer: Medicare Other

## 2021-12-23 ENCOUNTER — Emergency Department
Admission: EM | Admit: 2021-12-23 | Discharge: 2021-12-23 | Disposition: A | Discharge status: Home / Self Care | Payer: Medicare Other | Attending: Emergency Medicine | Admitting: Emergency Medicine

## 2021-12-23 ENCOUNTER — Other Ambulatory Visit: Payer: Self-pay

## 2021-12-23 ENCOUNTER — Encounter: Payer: Self-pay | Admitting: Emergency Medicine

## 2021-12-23 DIAGNOSIS — R159 Full incontinence of feces: Secondary | ICD-10-CM | POA: Diagnosis not present

## 2021-12-23 DIAGNOSIS — R945 Abnormal results of liver function studies: Secondary | ICD-10-CM | POA: Diagnosis not present

## 2021-12-23 DIAGNOSIS — R1111 Vomiting without nausea: Secondary | ICD-10-CM | POA: Diagnosis not present

## 2021-12-23 DIAGNOSIS — R110 Nausea: Secondary | ICD-10-CM | POA: Diagnosis not present

## 2021-12-23 DIAGNOSIS — M7981 Nontraumatic hematoma of soft tissue: Secondary | ICD-10-CM | POA: Insufficient documentation

## 2021-12-23 DIAGNOSIS — I2510 Atherosclerotic heart disease of native coronary artery without angina pectoris: Secondary | ICD-10-CM | POA: Insufficient documentation

## 2021-12-23 DIAGNOSIS — E1140 Type 2 diabetes mellitus with diabetic neuropathy, unspecified: Secondary | ICD-10-CM | POA: Insufficient documentation

## 2021-12-23 DIAGNOSIS — R9431 Abnormal electrocardiogram [ECG] [EKG]: Secondary | ICD-10-CM | POA: Diagnosis not present

## 2021-12-23 DIAGNOSIS — Z85038 Personal history of other malignant neoplasm of large intestine: Secondary | ICD-10-CM | POA: Diagnosis not present

## 2021-12-23 DIAGNOSIS — R6889 Other general symptoms and signs: Secondary | ICD-10-CM | POA: Diagnosis not present

## 2021-12-23 DIAGNOSIS — N184 Chronic kidney disease, stage 4 (severe): Secondary | ICD-10-CM | POA: Insufficient documentation

## 2021-12-23 DIAGNOSIS — Z955 Presence of coronary angioplasty implant and graft: Secondary | ICD-10-CM | POA: Insufficient documentation

## 2021-12-23 DIAGNOSIS — E209 Hypoparathyroidism, unspecified: Secondary | ICD-10-CM | POA: Insufficient documentation

## 2021-12-23 DIAGNOSIS — Z7901 Long term (current) use of anticoagulants: Secondary | ICD-10-CM | POA: Insufficient documentation

## 2021-12-23 DIAGNOSIS — M25551 Pain in right hip: Secondary | ICD-10-CM | POA: Diagnosis not present

## 2021-12-23 DIAGNOSIS — R32 Unspecified urinary incontinence: Secondary | ICD-10-CM | POA: Diagnosis not present

## 2021-12-23 DIAGNOSIS — E1122 Type 2 diabetes mellitus with diabetic chronic kidney disease: Secondary | ICD-10-CM | POA: Diagnosis not present

## 2021-12-23 DIAGNOSIS — S0990XA Unspecified injury of head, initial encounter: Secondary | ICD-10-CM | POA: Diagnosis not present

## 2021-12-23 DIAGNOSIS — Z743 Need for continuous supervision: Secondary | ICD-10-CM | POA: Diagnosis not present

## 2021-12-23 DIAGNOSIS — I700 Atherosclerosis of aorta: Secondary | ICD-10-CM | POA: Diagnosis not present

## 2021-12-23 DIAGNOSIS — Z96653 Presence of artificial knee joint, bilateral: Secondary | ICD-10-CM | POA: Insufficient documentation

## 2021-12-23 DIAGNOSIS — F0390 Unspecified dementia without behavioral disturbance: Secondary | ICD-10-CM | POA: Diagnosis not present

## 2021-12-23 DIAGNOSIS — I129 Hypertensive chronic kidney disease with stage 1 through stage 4 chronic kidney disease, or unspecified chronic kidney disease: Secondary | ICD-10-CM | POA: Diagnosis not present

## 2021-12-23 DIAGNOSIS — R197 Diarrhea, unspecified: Secondary | ICD-10-CM | POA: Diagnosis not present

## 2021-12-23 DIAGNOSIS — R109 Unspecified abdominal pain: Secondary | ICD-10-CM | POA: Diagnosis not present

## 2021-12-23 DIAGNOSIS — E860 Dehydration: Secondary | ICD-10-CM | POA: Diagnosis not present

## 2021-12-23 LAB — COMPREHENSIVE METABOLIC PANEL
ALT: 111 U/L — ABNORMAL HIGH (ref 0–44)
AST: 196 U/L — ABNORMAL HIGH (ref 15–41)
Albumin: 3.6 g/dL (ref 3.5–5.0)
Alkaline Phosphatase: 64 U/L (ref 38–126)
Anion gap: 11 (ref 5–15)
BUN: 29 mg/dL — ABNORMAL HIGH (ref 8–23)
CO2: 21 mmol/L — ABNORMAL LOW (ref 22–32)
Calcium: 8.4 mg/dL — ABNORMAL LOW (ref 8.9–10.3)
Chloride: 104 mmol/L (ref 98–111)
Creatinine, Ser: 1.45 mg/dL — ABNORMAL HIGH (ref 0.44–1.00)
GFR, Estimated: 36 mL/min — ABNORMAL LOW (ref >60)
Glucose, Bld: 168 mg/dL — ABNORMAL HIGH (ref 70–99)
Potassium: 3.5 mmol/L (ref 3.5–5.1)
Sodium: 136 mmol/L (ref 135–145)
Total Bilirubin: 1.8 mg/dL — ABNORMAL HIGH (ref 0.3–1.2)
Total Protein: 6.5 g/dL (ref 6.5–8.1)

## 2021-12-23 LAB — LIPASE, BLOOD: Lipase: 33 U/L (ref 11–51)

## 2021-12-23 LAB — PROTIME-INR
INR: 1.2 (ref 0.8–1.2)
Prothrombin Time: 15.6 seconds — ABNORMAL HIGH (ref 11.4–15.2)

## 2021-12-23 LAB — CBC
HCT: 30.1 % — ABNORMAL LOW (ref 36.0–46.0)
Hemoglobin: 10 g/dL — ABNORMAL LOW (ref 12.0–15.0)
MCH: 30.9 pg (ref 26.0–34.0)
MCHC: 33.2 g/dL (ref 30.0–36.0)
MCV: 92.9 fL (ref 80.0–100.0)
Platelets: 170 10*3/uL (ref 150–400)
RBC: 3.24 MIL/uL — ABNORMAL LOW (ref 3.87–5.11)
RDW: 12.5 % (ref 11.5–15.5)
WBC: 8.3 10*3/uL (ref 4.0–10.5)
nRBC: 0 % (ref 0.0–0.2)

## 2021-12-23 LAB — URINALYSIS, ROUTINE W REFLEX MICROSCOPIC
Bilirubin Urine: NEGATIVE
Glucose, UA: 50 mg/dL — AB
Ketones, ur: NEGATIVE mg/dL
Leukocytes,Ua: NEGATIVE
Nitrite: NEGATIVE
Protein, ur: 100 mg/dL — AB
Specific Gravity, Urine: 1.023 (ref 1.005–1.030)
pH: 5 (ref 5.0–8.0)

## 2021-12-23 MED ORDER — ONDANSETRON 4 MG PO TBDP: 4.0000 mg | ORAL_TABLET | Freq: Three times a day (TID) | ORAL | 0 refills | Status: AC | PRN

## 2021-12-23 MED ORDER — SODIUM CHLORIDE 0.9 % IV BOLUS
1000.0000 mL | Freq: Once | INTRAVENOUS | Status: AC
  Administered 2021-12-23: 1000 mL via INTRAVENOUS

## 2021-12-23 NOTE — ED Triage Notes (Signed)
FIRST NURSE NOTE:  ?Pt via [facility] from home. Per pt, she had a fall on Thursday and since then she has had a loss of appetite, NVD. Denies head injury but pt has a hematoma to the R hip per EMS. EMS gave 440m of fluid and '4mg'$  of Zofran. Pt is A&OX4 and NAD.  ? ?101/59 ?94HR ?100% on RA ?144 CBG ?

## 2021-12-23 NOTE — ED Provider Notes (Signed)
Patient received in signout from Dr. Starleen Blue pending urinalysis, CT scan of the chest and p.o. challenge for evaluation of nighttime incontinence of both stool and urine but asymptomatic during the daytime.  Urinalysis without infectious features, CT scan without evidence of acute intra-abdominal pathology and she is tolerating p.o. intake without difficulty.  I reassessed the patient and she reports feeling fine has no complaints.  She has a benign abdominal examination.  Work-up with CKD around baseline.  Slight elevation of LFTs are noted and hepatitis panel is pending.  She has no evidence of biliary obstruction clinically, no RUQ pain and no evidence of obstructive pathology on the CT scan.  We discussed close follow-up with PCP and reaching out to her gastroenterologist for follow-up and patient and daughter expressed understanding and agreement.  She suitable for outpatient management.  Wrote a prescription for Zofran. ?  ?Vladimir Crofts, MD ?12/23/21 1625 ? ?

## 2021-12-23 NOTE — ED Provider Notes (Signed)
? ?[facility] ?Provider Note ? ? ? Event Date/Time  ? First MD Initiated Contact with Patient 12/23/21 1340   ?  (approximate) ? ? ?History  ? ?Nausea and Diarrhea ? ? ?HPI ? ?CARALINA NOP is a 81 y.o. female past medical history of adenocarcinoma of the colon status post resection, anemia, diabetes, GERD, dementia, or coronary disease who presents with nausea and diarrhea.  Patient's daughter notes that last Thursday which was approximately 5 days ago patient had a mechanical fall to her right hip.  She is on Eliquis and developed a hematoma.  Patient was able to get up on her own has been ambulating without difficulty.  Then yesterday patient had an episode of retching.  She has been having some fecal incontinence during the night only.  She is able to hold her bowels and bladder during the day.  Last night she had multiple episodes of loose stool.  Patient denies abdominal discomfort.  Has not been eating and drinking well.  Had some nausea today as well.  Notes that its not unusual for her to occasionally have an accident but typically does not have multiple.  Patient denies any numbness tingling weakness in her extremities.  Does have back pain since being in the waiting room but not after the fall.  Denies hitting her head after the fall.  Patient currently denies any dyspnea chest pain abdominal pain or nausea.  Overall feels well. ?  ? ?Past Medical History:  ?Diagnosis Date  ? Adenocarcinoma, colon ([place])   ? Anemia   ? iron deficiency  ? Anxiety   ? Carpal tunnel syndrome   ? Cataract   ? REMOVED BILATERAL  ? Chronic diarrhea   ? Colon polyps   ? Tubular adenoma  ? Degenerative disk disease   ? Depression   ? Diabetes mellitus, type 2 ([place])   ? Dysrhythmia   ? h/o AFIB  ? GERD (gastroesophageal reflux disease)   ? History of blood transfusion   ? After surgeries  ? Hyperlipidemia   ? Hypertension   ? IBS (irritable bowel syndrome)   ? Kidney disorder   ? Osteoarthritis   ?  Osteoporosis   ? PONV (postoperative nausea and vomiting)   ? S/P coronary artery stent placement 2007  ? ? ?Patient Active Problem List  ? Diagnosis Date Noted  ? Saccular aneurysm 09/05/2021  ? Secondary hyperparathyroidism ([place]) 04/11/2021  ? Ischemic vascular dementia ([place]) 04/11/2021  ? Secondary hypoparathyroidism ([place]) 04/08/2021  ? Left leg swelling 03/05/2020  ? Hypertension associated with diabetes ([place]) 01/09/2020  ? Lightheadedness 01/09/2020  ? Plantar fasciitis, left 12/13/2019  ? Acquired trigger finger of left ring finger 07/11/2019  ? Osteoarthritis of both wrists 07/11/2019  ? Coronary artery disease involving native coronary artery of native heart without angina pectoris 10/24/2018  ? Senile purpura ([place]) 03/01/2018  ? History of total replacement of right shoulder joint 11/06/2016  ? Anemia of chronic disease 11/29/2015  ? Spinal stenosis, lumbar region, with neurogenic claudication 11/26/2015  ?  Class: Chronic  ? Mixed incontinence urge and stress 10/29/2015  ? CKD stage 4 due to type 2 diabetes mellitus ([place]) 06/25/2015  ? Counseling regarding end of life decision making 10/26/2014  ? S/P total knee replacement using cement 04/03/2014  ? Atrial fibrillation ([place]) 10/17/2010  ? Allergic rhinitis 01/23/2009  ? ADENOCARCINOMA, COLON, CECUM 10/05/2008  ? Coronary atherosclerosis 11/23/2007  ? ANXIETY 09/19/2007  ? Major depressive disorder, recurrent episode, moderate (  [place]) 09/19/2007  ? GERD 09/19/2007  ? Controlled type 2 diabetes mellitus with diabetic nephropathy ([place]) 03/21/2007  ? SYNDROME, CARPAL TUNNEL 03/21/2007  ? Hyperlipidemia associated with type 2 diabetes mellitus ([place]) 03/21/2007  ? IBS 03/21/2007  ? Osteoarthrosis, unspecified whether generalized or localized, unspecified site 03/21/2007  ? ? ? ?Physical Exam  ?Triage Vital Signs: ?ED Triage Vitals  ?Enc Vitals Group  ?   BP 12/23/21 1145 93/61  ?   Pulse Rate 12/23/21 1145 85  ?   Resp 12/23/21 1145 18  ?   Temp 12/23/21 1145  99.8 ?F (37.7 ?C)  ?   Temp Source 12/23/21 1145 Oral  ?   SpO2 12/23/21 1145 95 %  ?   Weight 12/23/21 1147 155 lb (70.3 kg)  ?   Height 12/23/21 1147 '5\' 5"'$  (1.651 m)  ?   Head Circumference --   ?   Peak Flow --   ?   Pain Score 12/23/21 1147 4  ?   Pain Loc --   ?   Pain Edu? --   ?   Excl. in [place]? --   ? ? ?Most recent vital signs: ?Vitals:  ? 12/23/21 1145 12/23/21 1645  ?BP: 93/61 98/64  ?Pulse: 85 89  ?Resp: 18 19  ?Temp: 99.8 ?F (37.7 ?C)   ?SpO2: 95% 97%  ? ? ? ?General: Awake, no distress.  ?CV:  Good peripheral perfusion.  ?Resp:  Normal effort.  Lungs are clear ?Abd:  No distention. Soft, nontender  to palpation  ?Neuro:             Awake, Alert, Oriented x 3  ?Other:  Significant hematoma over the right greater trochanter, compartment is soft, there is a a blister at the center of the hematoma which is mildly tender to palpation ?Patient is able to range both hips bilaterally pelvis is stable nontender ?5-5 strength with hip flexion, plantarflexion dorsiflexion bilaterally ?No C, T or L-spine tenderness ? ? ?ED Results / Procedures / Treatments  ?Labs ?(all labs ordered are listed, but only abnormal results are displayed) ?Labs Reviewed  ?COMPREHENSIVE METABOLIC PANEL - Abnormal; Notable for the following components:  ?    Result Value  ? CO2 21 (*)   ? Glucose, Bld 168 (*)   ? BUN 29 (*)   ? Creatinine, Ser 1.45 (*)   ? Calcium 8.4 (*)   ? AST 196 (*)   ? ALT 111 (*)   ? Total Bilirubin 1.8 (*)   ? GFR, Estimated 36 (*)   ? All other components within normal limits  ?CBC - Abnormal; Notable for the following components:  ? RBC 3.24 (*)   ? Hemoglobin 10.0 (*)   ? HCT 30.1 (*)   ? All other components within normal limits  ?URINALYSIS, ROUTINE W REFLEX MICROSCOPIC - Abnormal; Notable for the following components:  ? Color, Urine AMBER (*)   ? APPearance HAZY (*)   ? Glucose, UA 50 (*)   ? Hgb urine dipstick SMALL (*)   ? Protein, ur 100 (*)   ? Bacteria, UA RARE (*)   ? All other components within normal  limits  ?PROTIME-INR - Abnormal; Notable for the following components:  ? Prothrombin Time 15.6 (*)   ? All other components within normal limits  ?LIPASE, BLOOD  ?HEPATITIS PANEL, ACUTE  ? ? ? ?EKG ? ? ? ? ?RADIOLOGY ?Xray pelvis reviewed by myself does not show a fracture ? ? ?PROCEDURES: ? ?Critical  Care performed: No ? ?.1-3 Lead EKG Interpretation ?Performed by: Rada Hay, MD ?Authorized by: Rada Hay, MD  ? ?  Interpretation: normal   ?  ECG rate assessment: normal   ?  Ectopy: none   ?  Conduction: normal   ? ?The patient is on the cardiac monitor to evaluate for evidence of arrhythmia and/or significant heart rate changes. ? ? ?MEDICATIONS ORDERED IN ED: ?Medications  ?sodium chloride 0.9 % bolus 1,000 mL (0 mLs Intravenous Stopped 12/23/21 1636)  ? ? ? ?IMPRESSION / MDM / ASSESSMENT AND PLAN / ED COURSE  ?I reviewed the triage vital signs and the nursing notes. ?             ?               ? ?Differential diagnosis includes, but is not limited to, UTI, SBO, fecal impaction, less likely cauda equina  ? ?81 yo female who presents with several episodes of fecal and urinary incontinence while sleeping over the past several days as well as nausea. Patient had a fall almost 5 days ago onto R hip, and has a hematoma on the R hip. She is on eliquis. Did not think she hit her head and has been ambulating on it without issue. Was fine until 2 days ago when the incontinence started and then today with nausea. Has no significant back pain (mildly sore after sitting in waiting room) no numbness or weakness in extremities and she is continent during the day. She has full strength in her extremities and no midline back ttp, exam is not c/w cauda equina.  Pt with mild suprapubic ttp but abdomen is benign. Reviewed the pts labs, Hgb 10 from baseline around 12-13, could be in thes/o hematoma, but I am reassured that this was 5 days ago, appears to be healing and is not expanding. I am not c/f ongoing blood  loss. Pts LFTs are mildly elevated, with nml bili and lipase. Denied Significant tylenol intake, will send hepatitis panel. UA is not c/w infection. Obtained CT head given that she fell and is on eliquis, which

## 2021-12-23 NOTE — ED Triage Notes (Signed)
Pts family reports that she developed nausea and loss control of her bowel and bladder, last night. She did fall on Thursday and has a hematoma on her right hip. She told her daughter that she has had trouble breathing also, denies this. Pt has dementia but daughter reports a little more confused than normal.  ?

## 2021-12-24 ENCOUNTER — Telehealth: Payer: Self-pay | Admitting: Family Medicine

## 2021-12-24 LAB — HEPATITIS PANEL, ACUTE
HCV Ab: NONREACTIVE
Hep A IgM: NONREACTIVE
Hep B C IgM: NONREACTIVE
Hepatitis B Surface Ag: NONREACTIVE

## 2021-12-24 NOTE — Telephone Encounter (Signed)
Noted. Agree with OV. 

## 2021-12-24 NOTE — Telephone Encounter (Signed)
Please call to get more information

## 2021-12-24 NOTE — Telephone Encounter (Signed)
I spoke with Tonya [name] (DPR signed) Tonya [name] said since Jan noticed decline in memory issues which are worsening, pt has lost 10 -15 lbs since Jan 2023 and pt lives in [place] home and Tonya [name] makes sure pt gets lunch and supper but pt is not eating well. On 12/23/21 pt was more confused and was not sure what day of wk it was.pt had been incontinent of bowel and urine, and pt was having difficulty breathing. Pt has had 2 falls in last few days. The last fall in pts bedroom after loosing balance after coming home from ED on 12/23/21; no apparent injury. Tonya [name] does not think pt has been taking Prevalite and Tonya [name] thinks that is related to incontinence. Pt has not complained of abd pain or fever. Tonya [name] said pt had CT of abd that was OK at ED. Tonya [name] said LFT were elevated. Tonya [name] said it is getting to point she is afraid to leave pt at home just to go to store for fear of pt falling. Tonya [name] also said she can't monitor pts eating and taking her meds. Pt usually works from home but there are times Tonya [name] has to leave home. Tonya [name] said she was going to start to look at availability of facilities for pt. Tonya [name] scheduled ED FU with Dr Diona Browner on 12/25/21. UC & ED precautions given and Tonya [name] voiced understanding. Sending note to Dr Diona Browner and Butch Penny CMA and will teams Butch Penny about length of time for appt on 12/25/21.  ?

## 2021-12-24 NOTE — Telephone Encounter (Signed)
Pt daughter called stating that pt went to the ER yesterday.Pt daughter states that they didn't find anything but Pt daughter would like a call back to discuss pt over all health and mental state. Please advise. ?

## 2021-12-25 ENCOUNTER — Encounter (HOSPITAL_COMMUNITY): Payer: Self-pay

## 2021-12-25 ENCOUNTER — Inpatient Hospital Stay (HOSPITAL_COMMUNITY)
Admission: EM | Admit: 2021-12-25 | Discharge: 2021-12-29 | DRG: 442 | Disposition: A | Discharge status: Home Health | Payer: Medicare Other | Attending: Internal Medicine | Admitting: Internal Medicine

## 2021-12-25 ENCOUNTER — Emergency Department (HOSPITAL_COMMUNITY): Payer: Medicare Other

## 2021-12-25 ENCOUNTER — Other Ambulatory Visit: Payer: Self-pay

## 2021-12-25 ENCOUNTER — Encounter: Payer: Self-pay | Admitting: Family Medicine

## 2021-12-25 ENCOUNTER — Ambulatory Visit (INDEPENDENT_AMBULATORY_CARE_PROVIDER_SITE_OTHER): Payer: Medicare Other | Admitting: Family Medicine

## 2021-12-25 VITALS — BP 74/54 | HR 86 | Temp 97.8°F | Ht 65.0 in | Wt 159.4 lb

## 2021-12-25 DIAGNOSIS — E1121 Type 2 diabetes mellitus with diabetic nephropathy: Secondary | ICD-10-CM

## 2021-12-25 DIAGNOSIS — I129 Hypertensive chronic kidney disease with stage 1 through stage 4 chronic kidney disease, or unspecified chronic kidney disease: Secondary | ICD-10-CM | POA: Diagnosis present

## 2021-12-25 DIAGNOSIS — E876 Hypokalemia: Secondary | ICD-10-CM

## 2021-12-25 DIAGNOSIS — R197 Diarrhea, unspecified: Secondary | ICD-10-CM | POA: Diagnosis present

## 2021-12-25 DIAGNOSIS — K838 Other specified diseases of biliary tract: Secondary | ICD-10-CM | POA: Diagnosis not present

## 2021-12-25 DIAGNOSIS — Z9049 Acquired absence of other specified parts of digestive tract: Secondary | ICD-10-CM | POA: Diagnosis not present

## 2021-12-25 DIAGNOSIS — Z96652 Presence of left artificial knee joint: Secondary | ICD-10-CM | POA: Diagnosis present

## 2021-12-25 DIAGNOSIS — I517 Cardiomegaly: Secondary | ICD-10-CM | POA: Diagnosis present

## 2021-12-25 DIAGNOSIS — M810 Age-related osteoporosis without current pathological fracture: Secondary | ICD-10-CM | POA: Diagnosis not present

## 2021-12-25 DIAGNOSIS — I2510 Atherosclerotic heart disease of native coronary artery without angina pectoris: Secondary | ICD-10-CM | POA: Diagnosis present

## 2021-12-25 DIAGNOSIS — E860 Dehydration: Secondary | ICD-10-CM | POA: Diagnosis not present

## 2021-12-25 DIAGNOSIS — N184 Chronic kidney disease, stage 4 (severe): Secondary | ICD-10-CM | POA: Diagnosis present

## 2021-12-25 DIAGNOSIS — N1832 Chronic kidney disease, stage 3b: Secondary | ICD-10-CM

## 2021-12-25 DIAGNOSIS — F0150 Vascular dementia without behavioral disturbance: Secondary | ICD-10-CM | POA: Diagnosis present

## 2021-12-25 DIAGNOSIS — D696 Thrombocytopenia, unspecified: Secondary | ICD-10-CM | POA: Diagnosis not present

## 2021-12-25 DIAGNOSIS — G309 Alzheimer's disease, unspecified: Secondary | ICD-10-CM | POA: Diagnosis present

## 2021-12-25 DIAGNOSIS — I480 Paroxysmal atrial fibrillation: Secondary | ICD-10-CM | POA: Diagnosis present

## 2021-12-25 DIAGNOSIS — E785 Hyperlipidemia, unspecified: Secondary | ICD-10-CM | POA: Diagnosis not present

## 2021-12-25 DIAGNOSIS — F419 Anxiety disorder, unspecified: Secondary | ICD-10-CM | POA: Diagnosis present

## 2021-12-25 DIAGNOSIS — K219 Gastro-esophageal reflux disease without esophagitis: Secondary | ICD-10-CM | POA: Diagnosis not present

## 2021-12-25 DIAGNOSIS — R7989 Other specified abnormal findings of blood chemistry: Secondary | ICD-10-CM

## 2021-12-25 DIAGNOSIS — Z79899 Other long term (current) drug therapy: Secondary | ICD-10-CM

## 2021-12-25 DIAGNOSIS — Z20822 Contact with and (suspected) exposure to covid-19: Secondary | ICD-10-CM | POA: Diagnosis present

## 2021-12-25 DIAGNOSIS — Z8719 Personal history of other diseases of the digestive system: Secondary | ICD-10-CM

## 2021-12-25 DIAGNOSIS — Z96611 Presence of right artificial shoulder joint: Secondary | ICD-10-CM | POA: Diagnosis present

## 2021-12-25 DIAGNOSIS — R112 Nausea with vomiting, unspecified: Secondary | ICD-10-CM | POA: Diagnosis not present

## 2021-12-25 DIAGNOSIS — E861 Hypovolemia: Secondary | ICD-10-CM | POA: Diagnosis present

## 2021-12-25 DIAGNOSIS — Z8042 Family history of malignant neoplasm of prostate: Secondary | ICD-10-CM | POA: Diagnosis not present

## 2021-12-25 DIAGNOSIS — R410 Disorientation, unspecified: Secondary | ICD-10-CM | POA: Diagnosis not present

## 2021-12-25 DIAGNOSIS — D649 Anemia, unspecified: Secondary | ICD-10-CM | POA: Diagnosis not present

## 2021-12-25 DIAGNOSIS — R262 Difficulty in walking, not elsewhere classified: Secondary | ICD-10-CM | POA: Diagnosis present

## 2021-12-25 DIAGNOSIS — E1122 Type 2 diabetes mellitus with diabetic chronic kidney disease: Secondary | ICD-10-CM | POA: Diagnosis not present

## 2021-12-25 DIAGNOSIS — Z8249 Family history of ischemic heart disease and other diseases of the circulatory system: Secondary | ICD-10-CM

## 2021-12-25 DIAGNOSIS — F02A0 Dementia in other diseases classified elsewhere, mild, without behavioral disturbance, psychotic disturbance, mood disturbance, and anxiety: Secondary | ICD-10-CM | POA: Diagnosis present

## 2021-12-25 DIAGNOSIS — Z955 Presence of coronary angioplasty implant and graft: Secondary | ICD-10-CM

## 2021-12-25 DIAGNOSIS — R1011 Right upper quadrant pain: Secondary | ICD-10-CM | POA: Diagnosis not present

## 2021-12-25 DIAGNOSIS — R630 Anorexia: Secondary | ICD-10-CM | POA: Diagnosis not present

## 2021-12-25 DIAGNOSIS — I9589 Other hypotension: Secondary | ICD-10-CM

## 2021-12-25 DIAGNOSIS — R531 Weakness: Secondary | ICD-10-CM | POA: Diagnosis not present

## 2021-12-25 DIAGNOSIS — R109 Unspecified abdominal pain: Secondary | ICD-10-CM | POA: Diagnosis not present

## 2021-12-25 DIAGNOSIS — R5383 Other fatigue: Secondary | ICD-10-CM | POA: Diagnosis not present

## 2021-12-25 DIAGNOSIS — C180 Malignant neoplasm of cecum: Secondary | ICD-10-CM | POA: Diagnosis present

## 2021-12-25 DIAGNOSIS — I482 Chronic atrial fibrillation, unspecified: Secondary | ICD-10-CM

## 2021-12-25 DIAGNOSIS — I10 Essential (primary) hypertension: Secondary | ICD-10-CM | POA: Diagnosis not present

## 2021-12-25 DIAGNOSIS — R6889 Other general symptoms and signs: Secondary | ICD-10-CM | POA: Diagnosis not present

## 2021-12-25 DIAGNOSIS — R17 Unspecified jaundice: Secondary | ICD-10-CM | POA: Diagnosis not present

## 2021-12-25 DIAGNOSIS — R000 Tachycardia, unspecified: Secondary | ICD-10-CM | POA: Diagnosis present

## 2021-12-25 DIAGNOSIS — Z85038 Personal history of other malignant neoplasm of large intestine: Secondary | ICD-10-CM

## 2021-12-25 DIAGNOSIS — E871 Hypo-osmolality and hyponatremia: Secondary | ICD-10-CM | POA: Diagnosis present

## 2021-12-25 DIAGNOSIS — Z833 Family history of diabetes mellitus: Secondary | ICD-10-CM

## 2021-12-25 DIAGNOSIS — E440 Moderate protein-calorie malnutrition: Secondary | ICD-10-CM | POA: Diagnosis not present

## 2021-12-25 DIAGNOSIS — E1169 Type 2 diabetes mellitus with other specified complication: Secondary | ICD-10-CM | POA: Diagnosis not present

## 2021-12-25 DIAGNOSIS — R935 Abnormal findings on diagnostic imaging of other abdominal regions, including retroperitoneum: Secondary | ICD-10-CM | POA: Diagnosis not present

## 2021-12-25 DIAGNOSIS — Z7901 Long term (current) use of anticoagulants: Secondary | ICD-10-CM

## 2021-12-25 DIAGNOSIS — D631 Anemia in chronic kidney disease: Secondary | ICD-10-CM | POA: Diagnosis present

## 2021-12-25 DIAGNOSIS — R1010 Upper abdominal pain, unspecified: Secondary | ICD-10-CM | POA: Diagnosis not present

## 2021-12-25 DIAGNOSIS — I4891 Unspecified atrial fibrillation: Secondary | ICD-10-CM | POA: Diagnosis present

## 2021-12-25 DIAGNOSIS — R5381 Other malaise: Secondary | ICD-10-CM | POA: Diagnosis present

## 2021-12-25 DIAGNOSIS — Z8371 Family history of colonic polyps: Secondary | ICD-10-CM

## 2021-12-25 DIAGNOSIS — K529 Noninfective gastroenteritis and colitis, unspecified: Secondary | ICD-10-CM | POA: Diagnosis present

## 2021-12-25 DIAGNOSIS — I499 Cardiac arrhythmia, unspecified: Secondary | ICD-10-CM | POA: Diagnosis not present

## 2021-12-25 DIAGNOSIS — T148XXA Other injury of unspecified body region, initial encounter: Secondary | ICD-10-CM | POA: Diagnosis not present

## 2021-12-25 DIAGNOSIS — Z743 Need for continuous supervision: Secondary | ICD-10-CM | POA: Diagnosis not present

## 2021-12-25 DIAGNOSIS — Z881 Allergy status to other antibiotic agents status: Secondary | ICD-10-CM

## 2021-12-25 LAB — CBC WITH DIFFERENTIAL/PLATELET
Abs Immature Granulocytes: 0.01 10*3/uL (ref 0.00–0.07)
Basophils Absolute: 0 10*3/uL (ref 0.0–0.1)
Basophils Relative: 1 %
Eosinophils Absolute: 0.1 10*3/uL (ref 0.0–0.5)
Eosinophils Relative: 2 %
HCT: 28.3 % — ABNORMAL LOW (ref 36.0–46.0)
Hemoglobin: 9.6 g/dL — ABNORMAL LOW (ref 12.0–15.0)
Immature Granulocytes: 0 %
Lymphocytes Relative: 6 %
Lymphs Abs: 0.3 10*3/uL — ABNORMAL LOW (ref 0.7–4.0)
MCH: 31.8 pg (ref 26.0–34.0)
MCHC: 33.9 g/dL (ref 30.0–36.0)
MCV: 93.7 fL (ref 80.0–100.0)
Monocytes Absolute: 0.5 10*3/uL (ref 0.1–1.0)
Monocytes Relative: 8 %
Neutro Abs: 4.5 10*3/uL (ref 1.7–7.7)
Neutrophils Relative %: 83 %
Platelets: 141 10*3/uL — ABNORMAL LOW (ref 150–400)
RBC: 3.02 MIL/uL — ABNORMAL LOW (ref 3.87–5.11)
RDW: 12.6 % (ref 11.5–15.5)
WBC: 5.5 10*3/uL (ref 4.0–10.5)
nRBC: 0 % (ref 0.0–0.2)

## 2021-12-25 LAB — COMPREHENSIVE METABOLIC PANEL
ALT: 80 U/L — ABNORMAL HIGH (ref 0–44)
AST: 75 U/L — ABNORMAL HIGH (ref 15–41)
Albumin: 2.8 g/dL — ABNORMAL LOW (ref 3.5–5.0)
Alkaline Phosphatase: 100 U/L (ref 38–126)
Anion gap: 9 (ref 5–15)
BUN: 33 mg/dL — ABNORMAL HIGH (ref 8–23)
CO2: 17 mmol/L — ABNORMAL LOW (ref 22–32)
Calcium: 8 mg/dL — ABNORMAL LOW (ref 8.9–10.3)
Chloride: 107 mmol/L (ref 98–111)
Creatinine, Ser: 1.67 mg/dL — ABNORMAL HIGH (ref 0.44–1.00)
GFR, Estimated: 31 mL/min — ABNORMAL LOW (ref >60)
Glucose, Bld: 148 mg/dL — ABNORMAL HIGH (ref 70–99)
Potassium: 3.5 mmol/L (ref 3.5–5.1)
Sodium: 133 mmol/L — ABNORMAL LOW (ref 135–145)
Total Bilirubin: 3 mg/dL — ABNORMAL HIGH (ref 0.3–1.2)
Total Protein: 5.2 g/dL — ABNORMAL LOW (ref 6.5–8.1)

## 2021-12-25 LAB — URINALYSIS, ROUTINE W REFLEX MICROSCOPIC
Bilirubin Urine: NEGATIVE
Glucose, UA: NEGATIVE mg/dL
Ketones, ur: NEGATIVE mg/dL
Leukocytes,Ua: NEGATIVE
Nitrite: NEGATIVE
Protein, ur: 30 mg/dL — AB
Specific Gravity, Urine: 1.013 (ref 1.005–1.030)
pH: 5 (ref 5.0–8.0)

## 2021-12-25 LAB — AMMONIA: Ammonia: 20 umol/L (ref 9–35)

## 2021-12-25 LAB — BASIC METABOLIC PANEL
Anion gap: 9 (ref 5–15)
BUN: 33 mg/dL — ABNORMAL HIGH (ref 8–23)
CO2: 16 mmol/L — ABNORMAL LOW (ref 22–32)
Calcium: 8 mg/dL — ABNORMAL LOW (ref 8.9–10.3)
Chloride: 107 mmol/L (ref 98–111)
Creatinine, Ser: 1.68 mg/dL — ABNORMAL HIGH (ref 0.44–1.00)
GFR, Estimated: 30 mL/min — ABNORMAL LOW (ref >60)
Glucose, Bld: 147 mg/dL — ABNORMAL HIGH (ref 70–99)
Potassium: 3.5 mmol/L (ref 3.5–5.1)
Sodium: 132 mmol/L — ABNORMAL LOW (ref 135–145)

## 2021-12-25 LAB — TROPONIN I (HIGH SENSITIVITY)
Troponin I (High Sensitivity): 6 ng/L (ref <18)
Troponin I (High Sensitivity): 7 ng/L (ref <18)

## 2021-12-25 LAB — HEPATIC FUNCTION PANEL
ALT: 81 U/L — ABNORMAL HIGH (ref 0–44)
AST: 77 U/L — ABNORMAL HIGH (ref 15–41)
Albumin: 2.8 g/dL — ABNORMAL LOW (ref 3.5–5.0)
Alkaline Phosphatase: 101 U/L (ref 38–126)
Bilirubin, Direct: 1.8 mg/dL — ABNORMAL HIGH (ref 0.0–0.2)
Indirect Bilirubin: 1.4 mg/dL — ABNORMAL HIGH (ref 0.3–0.9)
Total Bilirubin: 3.2 mg/dL — ABNORMAL HIGH (ref 0.3–1.2)
Total Protein: 5.1 g/dL — ABNORMAL LOW (ref 6.5–8.1)

## 2021-12-25 LAB — PROTIME-INR
INR: 1.1 (ref 0.8–1.2)
Prothrombin Time: 14.3 seconds (ref 11.4–15.2)

## 2021-12-25 LAB — BRAIN NATRIURETIC PEPTIDE: B Natriuretic Peptide: 259 pg/mL — ABNORMAL HIGH (ref 0.0–100.0)

## 2021-12-25 LAB — LIPASE, BLOOD: Lipase: 71 U/L — ABNORMAL HIGH (ref 11–51)

## 2021-12-25 LAB — TSH: TSH: 4.414 u[IU]/mL (ref 0.350–4.500)

## 2021-12-25 MED ORDER — SODIUM CHLORIDE 0.9 % IV BOLUS
1000.0000 mL | Freq: Once | INTRAVENOUS | Status: AC
  Administered 2021-12-25: 1000 mL via INTRAVENOUS

## 2021-12-25 NOTE — Progress Notes (Signed)
? ? Patient ID: Tonya [name], female    DOB: 01-17-1941, 81 y.o.   MRN: [id number] ? ?This visit was conducted in person. ? ?BP (!) 74/54   Pulse 86   Temp 97.8 ?F (36.6 ?C) (Oral)   Ht $R'5\' 5"'YS$  (1.651 m)   Wt 159 lb 6 oz (72.3 kg)   SpO2 97%   BMI 26.52 kg/m?   ? ?CC:  ?Chief Complaint  ?Patient presents with  ? Follow-up  ?  ED Visit 12/23/2021  ? ? ?Subjective:  ? ?HPI: ?Tonya [name] is a 81 y.o. female  with history of  atrial fibrillation on Eliquis,  DM, CKD,  HTN,  MDD presenting on 12/25/2021 for Follow-up (ED Visit 12/23/2021) ? ?ED visit reviewed from 12/23/2021 ? On 3/23 she had a mechanical fall to right hip. She takes Eliquis and this lead to a hematoma. No head injury or LOC. ?She was doing well and was in normal state of health until 3/26 had nausea and gaging, decreaed po intake. ?3/28.. significant worsening in confusion new emesis and fecal incontinence,  felt some  mild SOB. ?Called EMS...went to [facility]. ? Daughter has noted significant decline in her mentals state in the  ?Poor po intake. ?UA  clear ? LFTs mildly elevated.  AST 196, ALt 11 Neg hepatitis panel ? bilirubin 1.8 and lipase ?Hg 10.0, INR nml, wbc nml 8.3 ? Cr 1.45 at baseline, ca low at 8.4 ? Glucose 168 ? Given IVF at hospital on 3/28. ?CT head neg for acute injury. ?CT abdomen: 1. Soft tissue contusion overlying the right greater trochanter. No ?evident underlying fracture. ?2. No noncontrast evidence of acute traumatic injury to the organs ?of the abdomen or pelvis. ? No focal liver issue ?No biliary dilatation. ?  ? ? Since then very decreased po intake and  another fall   ( sat down)given she has been feeling dizzy. ?Has only had small lemonade, coffee yesterday. ?No liquid intake today. ? She did take her medication today. ? ? Hematoma  sore still, but not bigger. ? 74/54 blood pressure in office, stumbling walking into office. ? ?   ?She appears more yellow today ? ? No checking FBS lately. ? ?Relevant past medical,  surgical, family and social history reviewed and updated as indicated. Interim medical history since our last visit reviewed. ?Allergies and medications reviewed and updated. ?Outpatient Medications Prior to Visit  ?Medication Sig Dispense Refill  ? atorvastatin (LIPITOR) 20 MG tablet TAKE 1 TABLET BY MOUTH EVERY DAY 90 tablet 1  ? Blood Glucose Monitoring Suppl (ACCU-CHEK AVIVA PLUS) w/Device KIT Use to check blood once sugar daily. 1 kit 0  ? carvedilol (COREG) 12.5 MG tablet TAKE 1 TABLET (12.$RemoveBefo'5MG'nbeoQPvPvGz$  TOTAL) BY MOUTH TWICE A DAY WITH MEALS 180 tablet 3  ? cetirizine (ZYRTEC) 10 MG tablet Take 10 mg by mouth at bedtime.    ? cholestyramine light (PREVALITE) 4 g packet USE 1 PACKET TWICE DAILY WITH A MEAL, AT LEAST 2 HOURS AWAY FROM OTHER MEDS  Patient needs an appointment for further refills. 540 packet 0  ? citalopram (CELEXA) 40 MG tablet TAKE 1 TABLET BY MOUTH EVERY DAY 90 tablet 1  ? diltiazem (CARDIZEM CD) 180 MG 24 hr capsule TAKE 1 CAPSULE BY MOUTH EVERY DAY 90 capsule 3  ? ELIQUIS 2.5 MG TABS tablet TAKE 1 TABLET BY MOUTH TWICE A DAY 60 tablet 11  ? ferrous sulfate 325 (65 FE) MG EC tablet Take 325 mg by mouth  2 (two) times daily.    ? glucose blood (ACCU-CHEK AVIVA PLUS) test strip Use to check blood sugar daily. 100 each 3  ? isosorbide mononitrate (IMDUR) 30 MG 24 hr tablet TAKE 1 TABLET BY MOUTH EVERY DAY 90 tablet 2  ? losartan (COZAAR) 100 MG tablet Take 1 tablet (100 mg total) by mouth daily. 90 tablet 3  ? nitrofurantoin (MACRODANTIN) 100 MG capsule Take 100 mg by mouth 2 (two) times daily.    ? omeprazole (PRILOSEC) 20 MG capsule TAKE 1 CAPSULE BY MOUTH EVERY DAY 90 capsule 3  ? ondansetron (ZOFRAN-ODT) 4 MG disintegrating tablet Take 1 tablet (4 mg total) by mouth every 8 (eight) hours as needed. 20 tablet 0  ? Vitamin D, Ergocalciferol, (DRISDOL) 1.25 MG (50000 UNIT) CAPS capsule TAKE 1 CAPSULE (50,000 UNITS TOTAL) BY MOUTH EVERY 7 (SEVEN) DAYS 12 capsule 0  ? ?No facility-administered medications  prior to visit.  ?  ? ?Per HPI unless specifically indicated in ROS section below ?Review of Systems  ?Constitutional:  Positive for fatigue. Negative for chills and fever.  ?HENT:  Negative for congestion.   ?Eyes:  Negative for pain.  ?Respiratory:  Negative for cough and shortness of breath.   ?Cardiovascular:  Negative for chest pain, palpitations and leg swelling.  ?Gastrointestinal:  Negative for abdominal pain.  ?Genitourinary:  Negative for dysuria and vaginal bleeding.  ?Musculoskeletal:  Negative for back pain.  ?Neurological:  Positive for dizziness, weakness and light-headedness. Negative for syncope, facial asymmetry and headaches.  ?Psychiatric/Behavioral:  Negative for dysphoric mood.   ?Objective:  ?BP (!) 74/54   Pulse 86   Temp 97.8 ?F (36.6 ?C) (Oral)   Ht $R'5\' 5"'FY$  (1.651 m)   Wt 159 lb 6 oz (72.3 kg)   SpO2 97%   BMI 26.52 kg/m?   ?Wt Readings from Last 3 Encounters:  ?12/25/21 159 lb 6 oz (72.3 kg)  ?12/23/21 155 lb (70.3 kg)  ?11/27/21 157 lb 12.8 oz (71.6 kg)  ?  ?  ?Physical Exam ?Vitals and nursing note reviewed.  ?Constitutional:   ?   General: She is not in acute distress. ?   Appearance: Normal appearance. She is well-developed. She is ill-appearing and toxic-appearing.  ?HENT:  ?   Head: Normocephalic.  ?   Right Ear: Hearing, tympanic membrane, ear canal and external ear normal.  ?   Left Ear: Hearing, tympanic membrane, ear canal and external ear normal.  ?   Nose: Nose normal.  ?Eyes:  ?   General: Lids are normal. Lids are everted, no foreign bodies appreciated.  ?   Conjunctiva/sclera: Conjunctivae normal.  ?   Pupils: Pupils are equal, round, and reactive to light.  ?Neck:  ?   Thyroid: No thyroid mass or thyromegaly.  ?   Vascular: No carotid bruit.  ?   Trachea: Trachea normal.  ?Cardiovascular:  ?   Rate and Rhythm: Normal rate and regular rhythm.  ?   Heart sounds: Normal heart sounds, S1 normal and S2 normal. No murmur heard. ?  No gallop.  ?Pulmonary:  ?   Effort:  Pulmonary effort is normal. No respiratory distress.  ?   Breath sounds: Normal breath sounds. No wheezing, rhonchi or rales.  ?Abdominal:  ?   General: Bowel sounds are normal. There is no distension or abdominal bruit.  ?   Palpations: Abdomen is soft. There is no fluid wave or mass.  ?   Tenderness: There is abdominal tenderness in the right upper  quadrant and epigastric area. There is guarding. There is no rebound.  ?   Hernia: No hernia is present.  ?Musculoskeletal:  ?   Cervical back: Normal range of motion and neck supple.  ?Lymphadenopathy:  ?   Cervical: No cervical adenopathy.  ?Skin: ?   General: Skin is warm and dry.  ?   Coloration: Skin is jaundiced.  ?   Findings: No rash.  ?Neurological:  ?   Mental Status: She is alert. She is disoriented.  ?   Cranial Nerves: No cranial nerve deficit.  ?   Sensory: Sensation is intact. No sensory deficit.  ?   Motor: Motor function is intact.  ?   Coordination: Coordination abnormal.  ?   Gait: Gait abnormal.  ?   Comments: Able to answer simple qestions,  ?Psychiatric:     ?   Mood and Affect: Mood is not anxious or depressed.     ?   Speech: Speech normal.     ?   Behavior: Behavior normal. Behavior is cooperative.     ?   Judgment: Judgment normal.  ? ?   ?Results for orders placed or performed during the hospital encounter of 12/23/21  ?Lipase, blood  ?Result Value Ref Range  ? Lipase 33 11 - 51 U/L  ?Comprehensive metabolic panel  ?Result Value Ref Range  ? Sodium 136 135 - 145 mmol/L  ? Potassium 3.5 3.5 - 5.1 mmol/L  ? Chloride 104 98 - 111 mmol/L  ? CO2 21 (L) 22 - 32 mmol/L  ? Glucose, Bld 168 (H) 70 - 99 mg/dL  ? BUN 29 (H) 8 - 23 mg/dL  ? Creatinine, Ser 1.45 (H) 0.44 - 1.00 mg/dL  ? Calcium 8.4 (L) 8.9 - 10.3 mg/dL  ? Total Protein 6.5 6.5 - 8.1 g/dL  ? Albumin 3.6 3.5 - 5.0 g/dL  ? AST 196 (H) 15 - 41 U/L  ? ALT 111 (H) 0 - 44 U/L  ? Alkaline Phosphatase 64 38 - 126 U/L  ? Total Bilirubin 1.8 (H) 0.3 - 1.2 mg/dL  ? GFR, Estimated 36 (L) >60 mL/min  ?  Anion gap 11 5 - 15  ?CBC  ?Result Value Ref Range  ? WBC 8.3 4.0 - 10.5 K/uL  ? RBC 3.24 (L) 3.87 - 5.11 MIL/uL  ? Hemoglobin 10.0 (L) 12.0 - 15.0 g/dL  ? HCT 30.1 (L) 36.0 - 46.0 %  ? MCV 92.9 80.0 - 100.0 fL  ? MCH 30.9 26.0 -

## 2021-12-25 NOTE — ED Notes (Signed)
Pt was assisted to the bedside toilet with help.  ?

## 2021-12-25 NOTE — ED Triage Notes (Addendum)
Pt BIB [facility] from [facility]. Pt with hypotension related to decreased oral intake for "several weeks." Pt known to have elevated LFTs. EMS report pt is jaundice and noted RUQ tenderness. Pt A/Ox4 on arrival. EMS report the initial B/P was 70/50. EMS admin 1L of NaCl.  ?

## 2021-12-25 NOTE — ED Provider Notes (Signed)
?  Provider Note ?MRN:  [id number]  ?Arrival date & time: 12/25/21    ?ED Course and Medical Decision Making  ?Assumed care from Dr. Pearline Cables at shift change. ? ?Newly jaundiced, recent cholecystectomy, malaise, fatigue, nausea vomiting, dehydrated, will admit to medicine. ? ?Procedures ? ?Final Clinical Impressions(s) / ED Diagnoses  ? ?  ICD-10-CM   ?1. Jaundice  R17   ?  ?2. Hyperbilirubinemia  E80.6   ?  ?  ?ED Discharge Orders   ? ? None  ? ?  ?  ?Discharge Instructions   ?None ?  ? ? ?Barth Kirks. Sedonia Small, MD ?[facility] ?[email or web address] ? ?  ?Maudie Flakes, MD ?12/26/21 [phone] ? ?

## 2021-12-26 ENCOUNTER — Inpatient Hospital Stay (HOSPITAL_COMMUNITY): Payer: Medicare Other

## 2021-12-26 ENCOUNTER — Encounter (HOSPITAL_COMMUNITY): Payer: Self-pay | Admitting: Internal Medicine

## 2021-12-26 ENCOUNTER — Other Ambulatory Visit: Payer: Self-pay

## 2021-12-26 DIAGNOSIS — E871 Hypo-osmolality and hyponatremia: Secondary | ICD-10-CM | POA: Diagnosis present

## 2021-12-26 DIAGNOSIS — Z8042 Family history of malignant neoplasm of prostate: Secondary | ICD-10-CM | POA: Diagnosis not present

## 2021-12-26 DIAGNOSIS — E876 Hypokalemia: Secondary | ICD-10-CM | POA: Diagnosis present

## 2021-12-26 DIAGNOSIS — E440 Moderate protein-calorie malnutrition: Secondary | ICD-10-CM | POA: Insufficient documentation

## 2021-12-26 DIAGNOSIS — Z20822 Contact with and (suspected) exposure to covid-19: Secondary | ICD-10-CM | POA: Diagnosis present

## 2021-12-26 DIAGNOSIS — E1121 Type 2 diabetes mellitus with diabetic nephropathy: Secondary | ICD-10-CM

## 2021-12-26 DIAGNOSIS — E861 Hypovolemia: Secondary | ICD-10-CM

## 2021-12-26 DIAGNOSIS — K219 Gastro-esophageal reflux disease without esophagitis: Secondary | ICD-10-CM | POA: Diagnosis present

## 2021-12-26 DIAGNOSIS — R112 Nausea with vomiting, unspecified: Secondary | ICD-10-CM | POA: Diagnosis not present

## 2021-12-26 DIAGNOSIS — I129 Hypertensive chronic kidney disease with stage 1 through stage 4 chronic kidney disease, or unspecified chronic kidney disease: Secondary | ICD-10-CM | POA: Diagnosis present

## 2021-12-26 DIAGNOSIS — F02A0 Dementia in other diseases classified elsewhere, mild, without behavioral disturbance, psychotic disturbance, mood disturbance, and anxiety: Secondary | ICD-10-CM | POA: Diagnosis present

## 2021-12-26 DIAGNOSIS — D696 Thrombocytopenia, unspecified: Secondary | ICD-10-CM | POA: Diagnosis present

## 2021-12-26 DIAGNOSIS — E860 Dehydration: Secondary | ICD-10-CM

## 2021-12-26 DIAGNOSIS — M810 Age-related osteoporosis without current pathological fracture: Secondary | ICD-10-CM | POA: Diagnosis present

## 2021-12-26 DIAGNOSIS — R197 Diarrhea, unspecified: Secondary | ICD-10-CM | POA: Diagnosis present

## 2021-12-26 DIAGNOSIS — I9589 Other hypotension: Secondary | ICD-10-CM

## 2021-12-26 DIAGNOSIS — E1169 Type 2 diabetes mellitus with other specified complication: Secondary | ICD-10-CM

## 2021-12-26 DIAGNOSIS — E785 Hyperlipidemia, unspecified: Secondary | ICD-10-CM

## 2021-12-26 DIAGNOSIS — C180 Malignant neoplasm of cecum: Secondary | ICD-10-CM

## 2021-12-26 DIAGNOSIS — I517 Cardiomegaly: Secondary | ICD-10-CM

## 2021-12-26 DIAGNOSIS — E1122 Type 2 diabetes mellitus with diabetic chronic kidney disease: Secondary | ICD-10-CM

## 2021-12-26 DIAGNOSIS — R17 Unspecified jaundice: Secondary | ICD-10-CM

## 2021-12-26 DIAGNOSIS — R630 Anorexia: Secondary | ICD-10-CM | POA: Diagnosis not present

## 2021-12-26 DIAGNOSIS — I482 Chronic atrial fibrillation, unspecified: Secondary | ICD-10-CM | POA: Diagnosis not present

## 2021-12-26 DIAGNOSIS — G309 Alzheimer's disease, unspecified: Secondary | ICD-10-CM | POA: Diagnosis present

## 2021-12-26 DIAGNOSIS — N184 Chronic kidney disease, stage 4 (severe): Secondary | ICD-10-CM

## 2021-12-26 DIAGNOSIS — I480 Paroxysmal atrial fibrillation: Secondary | ICD-10-CM | POA: Diagnosis present

## 2021-12-26 DIAGNOSIS — R7989 Other specified abnormal findings of blood chemistry: Secondary | ICD-10-CM

## 2021-12-26 DIAGNOSIS — I2510 Atherosclerotic heart disease of native coronary artery without angina pectoris: Secondary | ICD-10-CM | POA: Diagnosis present

## 2021-12-26 DIAGNOSIS — D631 Anemia in chronic kidney disease: Secondary | ICD-10-CM | POA: Diagnosis present

## 2021-12-26 DIAGNOSIS — F0150 Vascular dementia without behavioral disturbance: Secondary | ICD-10-CM

## 2021-12-26 DIAGNOSIS — K838 Other specified diseases of biliary tract: Secondary | ICD-10-CM | POA: Diagnosis present

## 2021-12-26 DIAGNOSIS — Z9049 Acquired absence of other specified parts of digestive tract: Secondary | ICD-10-CM | POA: Diagnosis not present

## 2021-12-26 LAB — COMPREHENSIVE METABOLIC PANEL
ALT: 75 U/L — ABNORMAL HIGH (ref 0–44)
AST: 70 U/L — ABNORMAL HIGH (ref 15–41)
Albumin: 2.6 g/dL — ABNORMAL LOW (ref 3.5–5.0)
Alkaline Phosphatase: 103 U/L (ref 38–126)
Anion gap: 8 (ref 5–15)
BUN: 28 mg/dL — ABNORMAL HIGH (ref 8–23)
CO2: 16 mmol/L — ABNORMAL LOW (ref 22–32)
Calcium: 8.1 mg/dL — ABNORMAL LOW (ref 8.9–10.3)
Chloride: 108 mmol/L (ref 98–111)
Creatinine, Ser: 1.45 mg/dL — ABNORMAL HIGH (ref 0.44–1.00)
GFR, Estimated: 36 mL/min — ABNORMAL LOW (ref >60)
Glucose, Bld: 97 mg/dL (ref 70–99)
Potassium: 3 mmol/L — ABNORMAL LOW (ref 3.5–5.1)
Sodium: 132 mmol/L — ABNORMAL LOW (ref 135–145)
Total Bilirubin: 3 mg/dL — ABNORMAL HIGH (ref 0.3–1.2)
Total Protein: 5 g/dL — ABNORMAL LOW (ref 6.5–8.1)

## 2021-12-26 LAB — GLUCOSE, CAPILLARY
Glucose-Capillary: 101 mg/dL — ABNORMAL HIGH (ref 70–99)
Glucose-Capillary: 167 mg/dL — ABNORMAL HIGH (ref 70–99)
Glucose-Capillary: 153 mg/dL — ABNORMAL HIGH (ref 70–99)

## 2021-12-26 LAB — HEMOGLOBIN A1C
Hgb A1c MFr Bld: 6.3 % — ABNORMAL HIGH (ref 4.8–5.6)
Mean Plasma Glucose: 134.11 mg/dL

## 2021-12-26 LAB — DIFFERENTIAL
Abs Immature Granulocytes: 0.02 10*3/uL (ref 0.00–0.07)
Basophils Absolute: 0 10*3/uL (ref 0.0–0.1)
Basophils Relative: 1 %
Eosinophils Absolute: 0.2 10*3/uL (ref 0.0–0.5)
Eosinophils Relative: 4 %
Immature Granulocytes: 0 %
Lymphocytes Relative: 7 %
Lymphs Abs: 0.4 10*3/uL — ABNORMAL LOW (ref 0.7–4.0)
Monocytes Absolute: 0.5 10*3/uL (ref 0.1–1.0)
Monocytes Relative: 9 %
Neutro Abs: 4.1 10*3/uL (ref 1.7–7.7)
Neutrophils Relative %: 79 %

## 2021-12-26 LAB — BILIRUBIN, DIRECT: Bilirubin, Direct: 0.7 mg/dL — ABNORMAL HIGH (ref 0.0–0.2)

## 2021-12-26 LAB — SEDIMENTATION RATE: Sed Rate: 12 mm/hr (ref 0–22)

## 2021-12-26 LAB — RESPIRATORY PANEL BY PCR

## 2021-12-26 LAB — CK: Total CK: 39 U/L (ref 38–234)

## 2021-12-26 LAB — CBC
HCT: 26.9 % — ABNORMAL LOW (ref 36.0–46.0)
Hemoglobin: 9.2 g/dL — ABNORMAL LOW (ref 12.0–15.0)
MCH: 31.7 pg (ref 26.0–34.0)
MCHC: 34.2 g/dL (ref 30.0–36.0)
MCV: 92.8 fL (ref 80.0–100.0)
Platelets: 150 10*3/uL (ref 150–400)
RBC: 2.9 MIL/uL — ABNORMAL LOW (ref 3.87–5.11)
RDW: 12.8 % (ref 11.5–15.5)
WBC: 5.2 10*3/uL (ref 4.0–10.5)
nRBC: 0 % (ref 0.0–0.2)

## 2021-12-26 LAB — HEPATITIS PANEL, ACUTE
HCV Ab: NONREACTIVE
Hep A IgM: NONREACTIVE
Hep B C IgM: NONREACTIVE
Hepatitis B Surface Ag: NONREACTIVE

## 2021-12-26 LAB — CBG MONITORING, ED
Glucose-Capillary: 107 mg/dL — ABNORMAL HIGH (ref 70–99)
Glucose-Capillary: 89 mg/dL (ref 70–99)
Glucose-Capillary: 101 mg/dL — ABNORMAL HIGH (ref 70–99)

## 2021-12-26 LAB — C DIFFICILE QUICK SCREEN W PCR REFLEX
C Diff antigen: NEGATIVE
C Diff interpretation: NOT DETECTED
C Diff toxin: NEGATIVE

## 2021-12-26 LAB — PHOSPHORUS: Phosphorus: 2.3 mg/dL — ABNORMAL LOW (ref 2.5–4.6)

## 2021-12-26 LAB — ECHOCARDIOGRAM COMPLETE
AR max vel: 2.23 cm2
AV Peak grad: 6 mmHg
Ao pk vel: 1.22 m/s
Area-P 1/2: 4.8 cm2
Calc EF: 57.6 %
Height: 65 in
S' Lateral: 3.1 cm
Single Plane A2C EF: 58.2 %
Single Plane A4C EF: 57.2 %
Weight: 2549.93 oz

## 2021-12-26 LAB — C-REACTIVE PROTEIN: CRP: 2.5 mg/dL — ABNORMAL HIGH (ref <1.0)

## 2021-12-26 LAB — OSMOLALITY: Osmolality: 285 mOsm/kg (ref 275–295)

## 2021-12-26 LAB — MAGNESIUM: Magnesium: 1.2 mg/dL — ABNORMAL LOW (ref 1.7–2.4)

## 2021-12-26 LAB — MONONUCLEOSIS SCREEN: Mono Screen: NEGATIVE

## 2021-12-26 MED ORDER — GADOBUTROL 1 MMOL/ML IV SOLN
7.0000 mL | Freq: Once | INTRAVENOUS | Status: AC | PRN
  Administered 2021-12-26: 7 mL via INTRAVENOUS

## 2021-12-26 MED ORDER — HYDROCODONE-ACETAMINOPHEN 5-325 MG PO TABS: 1.0000 | ORAL_TABLET | ORAL | Status: DC | PRN

## 2021-12-26 MED ORDER — ADULT MULTIVITAMIN W/MINERALS CH
1.0000 | ORAL_TABLET | Freq: Every day | ORAL | Status: DC
  Administered 2021-12-26 – 2021-12-29 (×4): 1 via ORAL
  Filled 2021-12-26 (×4): qty 1

## 2021-12-26 MED ORDER — MAGNESIUM SULFATE 4 GM/100ML IV SOLN
4.0000 g | Freq: Once | INTRAVENOUS | Status: AC
  Administered 2021-12-26: 4 g via INTRAVENOUS
  Filled 2021-12-26: qty 100

## 2021-12-26 MED ORDER — POTASSIUM CHLORIDE CRYS ER 20 MEQ PO TBCR
40.0000 meq | EXTENDED_RELEASE_TABLET | Freq: Once | ORAL | Status: AC
  Administered 2021-12-26: 40 meq via ORAL
  Filled 2021-12-26: qty 2

## 2021-12-26 MED ORDER — ENSURE ENLIVE PO LIQD
237.0000 mL | Freq: Three times a day (TID) | ORAL | Status: DC
  Administered 2021-12-26 – 2021-12-28 (×7): 237 mL via ORAL

## 2021-12-26 MED ORDER — INSULIN ASPART 100 UNIT/ML IJ SOLN
0.0000 [IU] | INTRAMUSCULAR | Status: DC
  Administered 2021-12-26 (×2): 2 [IU] via SUBCUTANEOUS
  Administered 2021-12-27: 1 [IU] via SUBCUTANEOUS
  Administered 2021-12-27: 2 [IU] via SUBCUTANEOUS

## 2021-12-26 MED ORDER — CARVEDILOL 6.25 MG PO TABS
6.2500 mg | ORAL_TABLET | Freq: Two times a day (BID) | ORAL | Status: DC
  Administered 2021-12-26 – 2021-12-29 (×7): 6.25 mg via ORAL
  Filled 2021-12-26 (×6): qty 1
  Filled 2021-12-26: qty 2

## 2021-12-26 MED ORDER — SODIUM CHLORIDE 0.9 % IV SOLN
75.0000 mL/h | INTRAVENOUS | Status: AC
  Administered 2021-12-26: 75 mL/h via INTRAVENOUS

## 2021-12-26 MED ORDER — PANTOPRAZOLE SODIUM 40 MG IV SOLR
40.0000 mg | Freq: Every day | INTRAVENOUS | Status: DC
  Administered 2021-12-26: 40 mg via INTRAVENOUS
  Filled 2021-12-26: qty 10

## 2021-12-26 MED ORDER — ACETAMINOPHEN 650 MG RE SUPP
650.0000 mg | Freq: Four times a day (QID) | RECTAL | Status: DC | PRN
Start: 2021-12-26 — End: 2021-12-29

## 2021-12-26 MED ORDER — ACETAMINOPHEN 325 MG PO TABS
650.0000 mg | ORAL_TABLET | Freq: Four times a day (QID) | ORAL | Status: DC | PRN
  Administered 2021-12-27 – 2021-12-28 (×2): 650 mg via ORAL
  Filled 2021-12-26 (×2): qty 2

## 2021-12-26 NOTE — Assessment & Plan Note (Signed)
Hold statins while npo and have elevated LFT ?

## 2021-12-26 NOTE — Assessment & Plan Note (Signed)
Allow permissive hypertension now has improved ?

## 2021-12-26 NOTE — H&P (Signed)
? ? ?Tonya [name] RCV:[id number] DOB: 1941-07-21 DOA: 12/25/2021 ?  ?  ?PCP: [name], MD   ?Outpatient Specialists:   ?CARDS:  Warren Lacy, PA-C  ?NEphrology:   Dr.SinghNolon Lennert, MD ?  ?GI Dr.Jacobs, Melene Plan, MD ?  ? ?Patient arrived to ER on 12/25/21 at 1729 ?Referred by Attending Maudie Flakes, MD ? ? ?Patient coming from:   ? home Lives With family ?  ? ?Chief Complaint:   ?Chief Complaint  ?Patient presents with  ? Hypotension  ?  ?HPI: ?Tonya [name] is a 81 y.o. female with medical history significant of  CAD, htn, A.FIB, dm2, ckd, hx of colon cancer ?   ?Presented with   N/V RUQ pain  ?Persistent nausea/ vomiting and dehydration ?Sp cholecystectomy ears ago ?Has been light headed trouble walking ? Pt was seen by her PCP that noted she was hypotensive down to 70/40's ?And sent her to ER ? Pt denies feer reports she started to have nausea vomiting diarrhea for the past few daysa nad now is really lightheaded ?Reports epigastric pain  ? ?Regarding pertinent Chronic problems:   ? ? Hyperlipidemia -  on statins Lipitor ?Lipid Panel  ?   ?Component Value Date/Time  ? CHOL 110 09/05/2021 1033  ? TRIG 165.0 (H) 09/05/2021 1033  ? TRIG 240 (HH) 09/07/2006 0849  ? HDL 42.30 09/05/2021 1033  ? CHOLHDL 3 09/05/2021 1033  ? VLDL 33.0 09/05/2021 1033  ? LDLCALC 35 09/05/2021 1033  ? LDLDIRECT 50.0 10/22/2014 0841  ? ?Colon cancer: early stage synchronous right-sided colon cancers diagnosed and resected in March 2010. She did not require any adjuvant chemotherapy. She has had serial interval follow-up and last colonoscopy was done in March 2014. She had 3 sessile polyps removed which were tubular adenomas.  Colonoscopy October 2019 6 sessile polyps were removed.  2 of them were greater than 1 cm.  These were all tubular adenomas.  ? ? HTN on coreg, Cardizem, losartan ? last echo 2019 preserved EF ?  ? ? DM 2 -  ?Lab Results  ?Component Value Date  ? HGBA1C 6.6 (H) 09/05/2021  ? diet controlled ?  ?A.  Fib -  - CHA2DS2 vas score   6   ? current  on anticoagulation with  Eliquis,  ?  ?       -  Rate control:  Currently controlled with  Coreg   ? ?  CKD stage IIIb- baseline Cr 1.6 ?Estimated Creatinine Clearance: 26.3 mL/min (A) (by C-G formula based on SCr of 1.67 mg/dL (H)). ? ?Lab Results  ?Component Value Date  ? CREATININE 1.67 (H) 12/25/2021  ? CREATININE 1.68 (H) 12/25/2021  ? CREATININE 1.45 (H) 12/23/2021  ? ? Chronic anemia - baseline hg Hemoglobin & Hematocrit  ?Recent Labs  ?  09/10/21 ?0907 12/23/21 ?1150 12/25/21 ?1837  ?HGB 13.5 10.0* 9.6*  ?  ? ?While in ER: ?Clinical Course as of 12/26/21 0033  ?Thu Dec 25, 2021  ?2318 CBD mildly dilated at 1.1 cm with surrounding fat stranding per radiology.  She has elevated bilirubin, jaundice, nausea and vomiting.  Will discuss with GI. ? ?Per chart review her LFTs have improved from 2 days ago.  Ammonia is nonelevated. [SG]  ?2359 [facility] on call, unclear etiology of pts abnormalities today. Recommends MRCP, will eval in the AM.  [SG]  ?  ?Clinical Course User Index ?[SG] [name], DO  ? ?   ?Ordered  ?  CXR - Mild cardiomegaly.  Otherwise no acute cardiopulmonary process.  ? ?[place] - No biliary dilatation. ?2. Otherwise unremarkable right upper quadrant ultrasound. ?  ?  ?CTabd/pelvis - 1. The stomach is nondistended. There is fat stranding adjacent to ?the gastric antrum and proximal duodenum, suggesting ?gastritis/duodenitis. No bowel obstruction or free air. ?2. Status post cholecystectomy with mild dilatation of the common ?bile duct measuring up to 1.1 cm. Fat stranding and prominent lymph ?nodes are noted at the porta hepatis, which may be infectious or ?inflammatory. Ultrasound is recommended for further evaluation. ?   ?Following Medications were ordered in ER: ?Medications  ?sodium chloride 0.9 % bolus 1,000 mL (0 mLs Intravenous Stopped 12/25/21 2300)  ?  ?_______________________________________________________ ?ER Provider Called:  [facility]     ?They  Recommend admit to medicine  want MRCP ?Will see in AM    ?  ?ED Triage Vitals  ?Enc Vitals Group  ?   BP 12/25/21 1735 100/62  ?   Pulse Rate 12/25/21 1735 86  ?   Resp 12/25/21 1735 20  ?   Temp 12/25/21 1735 98.2 ?F (36.8 ?C)  ?   Temp Source 12/25/21 1735 Oral  ?   SpO2 12/25/21 1735 96 %  ?   Weight 12/25/21 1741 159 lb 5.9 oz (72.3 kg)  ?   Height 12/25/21 1741 '5\' 5"'$  (1.651 m)  ?   Head Circumference --   ?   Peak Flow --   ?   Pain Score 12/25/21 1741 0  ?   Pain Loc --   ?   Pain Edu? --   ?   Excl. in [place]? --   ?HERD(40)@    ? _________________________________________ ?Significant initial  Findings: ?Abnormal Labs Reviewed  ?CBC WITH DIFFERENTIAL/PLATELET - Abnormal; Notable for the following components:  ?    Result Value  ? RBC 3.02 (*)   ? Hemoglobin 9.6 (*)   ? HCT 28.3 (*)   ? Platelets 141 (*)   ? Lymphs Abs 0.3 (*)   ? All other components within normal limits  ?COMPREHENSIVE METABOLIC PANEL - Abnormal; Notable for the following components:  ? Sodium 133 (*)   ? CO2 17 (*)   ? Glucose, Bld 148 (*)   ? BUN 33 (*)   ? Creatinine, Ser 1.67 (*)   ? Calcium 8.0 (*)   ? Total Protein 5.2 (*)   ? Albumin 2.8 (*)   ? AST 75 (*)   ? ALT 80 (*)   ? Total Bilirubin 3.0 (*)   ? GFR, Estimated 31 (*)   ? All other components within normal limits  ?LIPASE, BLOOD - Abnormal; Notable for the following components:  ? Lipase 71 (*)   ? All other components within normal limits  ?URINALYSIS, ROUTINE W REFLEX MICROSCOPIC - Abnormal; Notable for the following components:  ? Color, Urine AMBER (*)   ? APPearance HAZY (*)   ? Hgb urine dipstick SMALL (*)   ? Protein, ur 30 (*)   ? Bacteria, UA RARE (*)   ? All other components within normal limits  ?BRAIN NATRIURETIC PEPTIDE - Abnormal; Notable for the following components:  ? B Natriuretic Peptide 259.0 (*)   ? All other components within normal limits  ?HEPATIC FUNCTION PANEL - Abnormal; Notable for the following components:  ? Total Protein 5.1 (*)   ? Albumin 2.8 (*)    ? AST 77 (*)   ? ALT 81 (*)   ?  Total Bilirubin 3.2 (*)   ? Bilirubin, Direct 1.8 (*)   ? Indirect Bilirubin 1.4 (*)   ? All other components within normal limits  ?BASIC METABOLIC PANEL - Abnormal; Notable for the following components:  ? Sodium 132 (*)   ? CO2 16 (*)   ? Glucose, Bld 147 (*)   ? BUN 33 (*)   ? Creatinine, Ser 1.68 (*)   ? Calcium 8.0 (*)   ? GFR, Estimated 30 (*)   ? All other components within normal limits  ?  ?ECG: Ordered ?Personally reviewed by me showing: ?HR : 90 ?Rhythm: Atrial fibrillation ?Anteroseptal infarct, age indeterminate ?QTC 497 ?   ?The recent clinical data is shown below. ?Vitals:  ? 12/25/21 2200 12/25/21 2242 12/25/21 2300 12/25/21 2330  ?BP: (!) 106/56 111/72 106/61 104/82  ?Pulse:  92 90 92  ?Resp: '17 16 15 11  '$ ?Temp:      ?TempSrc:      ?SpO2:  96% 96% 96%  ?Weight:      ?Height:      ?  ?WBC ? ?   ?Component Value Date/Time  ? WBC 5.5 12/25/2021 1837  ? LYMPHSABS 0.3 (L) 12/25/2021 1837  ? MONOABS 0.5 12/25/2021 1837  ? EOSABS 0.1 12/25/2021 1837  ? BASOSABS 0.0 12/25/2021 1837  ?  ? UA   no evidence of UTI    ?  ?Urine analysis: ?   ?Component Value Date/Time  ? COLORURINE AMBER (A) 12/25/2021 2003  ? APPEARANCEUR HAZY (A) 12/25/2021 2003  ? LABSPEC 1.013 12/25/2021 2003  ? PHURINE 5.0 12/25/2021 2003  ? [place] NEGATIVE 12/25/2021 2003  ? HGBUR SMALL (A) 12/25/2021 2003  ? HGBUR negative 04/27/2008 0912  ? [place] NEGATIVE 12/25/2021 2003  ? [place] Stain NEGATIVE 12/25/2021 2003  ? PROTEINUR 30 (A) 12/25/2021 2003  ? UROBILINOGEN 0.2 04/05/2014 1458  ? NITRITE NEGATIVE 12/25/2021 2003  ? LEUKOCYTESUR NEGATIVE 12/25/2021 2003  ? ? ?Results for orders placed or performed during the hospital encounter of 03/18/21  ?Surgical pcr screen     Status: None  ? Collection Time: 03/18/21 10:20 AM  ? Specimen: Nasal Mucosa; Nasal Swab  ?Result Value Ref Range Status  ? MRSA, PCR NEGATIVE NEGATIVE Final  ? Staphylococcus aureus NEGATIVE NEGATIVE Final  ?  Comment: (NOTE) ?The  Xpert SA Assay (FDA approved for NASAL specimens in patients 29 ?years of age and older), is one component of a comprehensive ?surveillance program. It is not intended to diagnose infection nor to ?guide or m

## 2021-12-26 NOTE — Assessment & Plan Note (Addendum)
appreciate GI consult US showed no biliary dilatation total bili slightly up mostly direct ?CT abd showing gastritis ?Status post cholecystectomy with mild dilatation of the common ?bile duct measuring up to 1.1 cm. Fat stranding and prominent lymph ?nodes are noted at the porta hepatis, which may be infectious or ?inflammatory.  ?[facility] is aware and will see in AM  ?MRCP has been ordered at this time no evidence of infection ?Hold off on abx for now ? ? ? ?

## 2021-12-26 NOTE — Assessment & Plan Note (Signed)
-  chronic avoid nephrotoxic medications such as NSAIDs, Vanco Zosyn combo,  avoid hypotension, continue to follow renal function  

## 2021-12-26 NOTE — Assessment & Plan Note (Signed)
- 

## 2021-12-26 NOTE — Assessment & Plan Note (Signed)
Will rehydrate 

## 2021-12-26 NOTE — Progress Notes (Signed)
Patient's daughter, [name] - cell phone [phone], requests to talk to [facility] regarding patient placement.  The patient currently lives with Almyra Free.  Almyra Free has children and works full time and wants to explore the possibility of placing patient in an assisted living facility.  TOC consult placed. ?

## 2021-12-26 NOTE — Subjective & Objective (Signed)
Persistent nausea/ vomiting and dehydration ?Sp cholecystectomy ears ago ?Has been light headed trouble walking ? Pt was seen by her PCP that noted she was hypotensive down to 70/40's ?And sent her to ER ?  ?

## 2021-12-26 NOTE — Progress Notes (Signed)
Responded to bed alarm, patient found in the bathroom, bleeding from right arm.  Patient assisted back to bed, confused, pulled PIV out and took heart monitor off.  Patient reoriented and made comfortable.  Needs addressed. ?

## 2021-12-26 NOTE — Progress Notes (Signed)
Patient seen and examined this morning, admitted overnight, H&P reviewed and agree with assessment plan. ? ?Pleasant 81 year old female with history of CAD, HTN, A-fib, DM 2, CKD 3B, prior colon cancer who comes into the hospital with profuse diarrhea over the past week associated with weakness, lightheadedness.  She does not report any vomiting but has been having a poor appetite and was barely eating anything.  No significant abdominal pain that she can tell me.  She was found to have elevated LFTs, and GI was consulted. ? ?Principal problem ?Diarrhea-unclear etiology, possible viral.  Appreciate GI consultation.  Imaging of the abdomen unremarkable.  Send C. difficile and GI pathogen panel. ? ?Active problems ?Elevated LFTs-mild, in the 70s, appears to be trending down from couple days ago.  MRI/MRCP unremarkable.  Hepatitis panel negative couple of days ago, repeat pending but doubt he will be positive now ? ?Hypokalemia, hypomagnesemia-due to diarrheal illness, continue to replete and monitor. ? ?Paroxysmal A-fib - start carvedilol, resume Eliquis later on once confirmed with GI that no procedure is needed ? ?Ischemic vascular dementia-mild, good recollection of events and good insight into her medical issuess ? ?Hypotension-due to hypovolemia, blood pressure improving with fluids ? ?Hyperlipidemia-hold statin for now ? ?Colon adenocarcinoma-stable, monitor as an outpatient ? ?Cardiomegaly-echo ordered by the admitter ? ?Hyponatremia-due to dehydration, monitor sodium ? ?CKD 3B-baseline creatinine 1.5-1.9, currently at baseline ? ?Normocytic anemia-due to chronic kidney disease ? ?Thrombocytopenia-likely reactive.  Monitor. ? ? ?Scheduled Meds: ? carvedilol  6.25 mg Oral BID WC  ? insulin aspart  0-9 Units Subcutaneous Q4H  ? pantoprazole (PROTONIX) IV  40 mg Intravenous QHS  ? potassium chloride  40 mEq Oral Once  ? ?Continuous Infusions: ? sodium chloride 75 mL/hr (12/26/21 0210)  ? magnesium sulfate bolus  IVPB    ? ?PRN Meds:.acetaminophen **OR** acetaminophen, HYDROcodone-acetaminophen ? ?Adalynd Donahoe M. Cruzita Lederer, MD, PhD ?[facility] ? ?Between 7 am - 7 pm you can contact me via Amion (for emergencies) or Securechat (non urgent matters).  ?I am not available 7 pm - 7 am, please contact night coverage MD/APP via Amion ?

## 2021-12-26 NOTE — Assessment & Plan Note (Signed)
Restart protonix

## 2021-12-26 NOTE — Progress Notes (Addendum)
Initial Nutrition Assessment ? ?DOCUMENTATION CODES:  ? ?Non-severe (moderate) malnutrition in context of acute illness/injury ? ?INTERVENTION:  ? ?Ensure Enlive po TID, each supplement provides 350 kcal and 20 grams of protein. ? ?MVI with minerals daily. ? ?NUTRITION DIAGNOSIS:  ? ?Moderate Malnutrition related to acute illness as evidenced by mild muscle depletion, mild fat depletion. ? ?GOAL:  ? ?Patient will meet greater than or equal to 90% of their needs ? ?MONITOR:  ? ?PO intake, Supplement acceptance ? ?REASON FOR ASSESSMENT:  ? ?Consult ?Assessment of nutrition requirement/status ? ?ASSESSMENT:  ? ?81 yo female admitted with jaundice, anorexia, diarrhea, dehydration. PMH includes ischemic vascular dementia, CAD, HLD, HTN, colon cancer, chronic diarrhea, GERD, depression, osteoarthritis, IBS, DDD, DM-2, osteoporosis, CKD-3b. ? ?S/P ultrasound, CT, and MRI/MRCP since admission. No significant findings.  ? ?Patient reports poor intake for the past 1-2 weeks r/t diarrhea. She has not had any vomiting. She typically eats 2-3 meals/snacks per day. She gets tired of the same foods and she doesn't follow a specific diet. Patient states that she is no longer on medication for diabetes. She has never tried Ensure supplements, but agreed to try them between meals to maximize oral intake. She ate a good lunch today and now feels very full.  ? ?Labs reviewed. Na 132, K 3, Phos 2.3, mag 1.2 ?CBG: [phone] ? ?Medications reviewed and include Novolog, Protonix, Mag sulfate. ? ?Weight history reviewed. No significant weight changes noted.  ? ?NUTRITION - FOCUSED PHYSICAL EXAM: ? ?Flowsheet Row Most Recent Value  ?Orbital Region Mild depletion  ?Upper Arm Region Mild depletion  ?Thoracic and Lumbar Region Moderate depletion  ?Buccal Region Mild depletion  ?Temple Region Moderate depletion  ?Clavicle Bone Region Moderate depletion  ?Clavicle and Acromion Bone Region Moderate depletion  ?Scapular Bone Region Moderate  depletion  ?Dorsal Hand Moderate depletion  ?Patellar Region Mild depletion  ?Anterior Thigh Region Mild depletion  ?Posterior Calf Region Mild depletion  ?Edema (RD Assessment) None  ?Hair Reviewed  ?Eyes Reviewed  ?Mouth Reviewed  ?Skin Reviewed  ?Nails Reviewed  ? ?  ? ? ?Diet Order:   ?Diet Order   ? ?       ?  Diet Carb Modified Fluid consistency: Thin; Room service appropriate? Yes  Diet effective now       ?  ? ?  ?  ? ?  ? ? ?EDUCATION NEEDS:  ? ?Not appropriate for education at this time ? ?Skin:  Skin Assessment: Reviewed RN Assessment ? ?Last BM:  no BM documented ? ?Height:  ? ?Ht Readings from Last 1 Encounters:  ?12/25/21 '5\' 5"'$  (1.651 m)  ? ? ?Weight:  ? ?Wt Readings from Last 1 Encounters:  ?12/25/21 72.3 kg  ? ? ?BMI:  Body mass index is 26.52 kg/m?. ? ?Estimated Nutritional Needs:  ? ?Kcal:  1600-1800 ? ?Protein:  80-90 gm ? ?Fluid:  1.6-1.8 L ? ? ? ?Lucas Mallow RD, LDN, CNSC ?Please refer to Amion for contact information.                                                       ? ?

## 2021-12-26 NOTE — Assessment & Plan Note (Addendum)
Order stool studies, rehydrate ?Ct abd negative for colitis ?

## 2021-12-26 NOTE — Plan of Care (Signed)

## 2021-12-26 NOTE — Assessment & Plan Note (Signed)
Chronic stable, monitor for sun downing ?

## 2021-12-26 NOTE — Assessment & Plan Note (Signed)
Hold eliquis in case needs a procedure if BP can tolerate can restart home meds ?

## 2021-12-26 NOTE — Progress Notes (Signed)
Received patient from ED via stretcher.  Patient is alert, able to answer questions.  Patient is able to ambulate from stretcher to the bed with minimal assistance.  Assisted in position of comfort in bed.  Denies pain except for, patient states "I feel tender down here in my right thigh".  Right thigh noted with hematoma with a reddened center, ecchymotic around the area extending from right upper thigh down to above the knee area, lateral thigh, and down the back of right lower leg.  No drainage seen.  Marked the area of the hematoma.  Dr. Cruzita Lederer made aware.  Patient's needs addressed, daughter at bedside. ?

## 2021-12-26 NOTE — Assessment & Plan Note (Signed)
-   Order Sensitive  SSI     -  check TSH and HgA1C  - Hold by mouth medications*  

## 2021-12-26 NOTE — Assessment & Plan Note (Signed)
order echo ? ?

## 2021-12-26 NOTE — ED Provider Notes (Signed)
?[facility] EMERGENCY DEPARTMENT ?Provider Note ? ? ?CSN: [id number] ?Arrival date & time: 12/25/21  1729 ? ?  ? ?History ? ?Chief Complaint  ?Patient presents with  ? Hypotension  ? ? ?Tonya [name] is a 81 y.o. female. ? ?This is a 81 y.o. female with significant medical history as below, including dementia, A-fib, hyperlipidemia, pretension, IBS cholecystectomy, who presents to the ED with complaint of diarrhea, poor p.o. intake, jaundice, hypotension.  Sent by PCPs office. ? ?Patient was seen in the ER 2 days ago with similar complaint.  LFTs were elevated at that time.  Unclear etiology. ? ?Jaundice appears to have progressed.  Hypotensive in PCPs office. ? ?Patient is a poor historian.  I did discuss with the family bedside.  Patient with minimal p.o. intake the past few days.  Intermittent diarrhea.  Nausea without significant vomiting.  No fevers or chills.  No respiratory complaints. ? ? ?Past Medical History: ?No date: Adenocarcinoma, colon (HCC) ?No date: Anemia ?    Comment:  iron deficiency ?No date: Anxiety ?No date: Carpal tunnel syndrome ?No date: Cataract ?    Comment:  REMOVED BILATERAL ?No date: Chronic diarrhea ?No date: Colon polyps ?    Comment:  Tubular adenoma ?No date: Degenerative disk disease ?No date: Depression ?No date: Diabetes mellitus, type 2 (HCC) ?No date: Dysrhythmia ?    Comment:  h/o AFIB ?No date: GERD (gastroesophageal reflux disease) ?No date: History of blood transfusion ?    Comment:  After surgeries ?No date: Hyperlipidemia ?No date: Hypertension ?No date: IBS (irritable bowel syndrome) ?No date: Kidney disorder ?No date: Osteoarthritis ?No date: Osteoporosis ?No date: PONV (postoperative nausea and vomiting) ?2007: S/P coronary artery stent placement ? ?Past Surgical History: ?12/10: CARDIAC CATHETERIZATION ?    Comment:  LAD stent patent. Insignificant CAD, otherwise EF 60-65% ?No date: CARPAL TUNNEL RELEASE ?03/2015: CATARACT EXTRACTION ?No date:  CHOLECYSTECTOMY ?No date: COLON SURGERY ?No date: COLONOSCOPY ?No date: DILATION AND CURETTAGE OF UTERUS ?No date: HEMATOMA EVACUATION ?    Comment:  after knee replacement ?No date: KNEE ARTHROSCOPY ?    Comment:  bilateral ?11/26/2015: LUMBAR LAMINECTOMY; N/A ?    Comment:  Procedure: LUMBAR DECOMPRESSIVE LAMINECTOMY L3-4 AND  ?             L4-5, LEFT L2-3 HEMILAMINECTOMY;  Surgeon: James E Nitka, ?             MD;  Location: [facility];  Service: Orthopedics;  Laterality: ?             N/A; ?03/18/2021: ORIF ELBOW FRACTURE; Left ?    Comment:  Procedure: OPEN REDUCTION INTERNAL FIXATION (ORIF)  ?             ELBOW/OLECRANON FRACTURE;  Surgeon: Murphy, Timothy D,  ?             MD;  Location: [facility];  Service: Orthopedics;   ?             Laterality: Left; ?11/2008: PARTIAL COLECTOMY ?    Comment:  right, for adenocarcinoma ?04/2010: REPLACEMENT TOTAL KNEE ?    Comment:  right, complicated by hemarthrosis  ?05/28/2016: REVERSE SHOULDER ARTHROPLASTY; Right ?05/28/2016: REVERSE SHOULDER ARTHROPLASTY; Right ?    Comment:  Procedure: REVERSE SHOULDER ARTHROPLASTY;  Surgeon:  ?             Scott Gregory Dean, MD;  Location: [facility];  Service:  ?               Orthopedics;  Laterality: Right; ?No date: TONSILLECTOMY ?04/03/2014: TOTAL KNEE ARTHROPLASTY; Left ?    Comment:  dr whitfield ?04/03/2014: TOTAL KNEE ARTHROPLASTY; Left ?    Comment:  Procedure: TOTAL KNEE ARTHROPLASTY;  Surgeon: Vonna Kotyk  ?             Durward Fortes, MD;  Location: [place];  Service: Orthopedics;   ?             Laterality: Left; ?03/08/2012: TOTAL KNEE REVISION ?    Comment:  Procedure: TOTAL KNEE REVISION;  Surgeon: Vonna Kotyk  ?             Durward Fortes, MD;  Location: [place];  Service: Orthopedics;   ?             Laterality: Right;  removal total knee hardware and  ?             placement of antibiotic cement spacer and antibiotic  ?             beads ?05/17/2012: TOTAL KNEE REVISION ?    Comment:  Procedure: TOTAL KNEE REVISION;  Surgeon: Vonna Kotyk  ?              Durward Fortes, MD;  Location: [place];  Service: Orthopedics;   ?             Laterality: Right;  right total knee revision, removal of ?             antibiotic spacer  ? ? ?The history is provided by the patient and a relative. The history is limited by the condition of the patient. No language interpreter was used.  ? ?  ? ?Home Medications ?Prior to Admission medications   ?Medication Sig Start Date End Date Taking? Authorizing Provider  ?atorvastatin (LIPITOR) 20 MG tablet TAKE 1 TABLET BY MOUTH EVERY DAY ?Patient taking differently: Take 20 mg by mouth daily. 04/14/21  Yes Minus Breeding, MD  ?carvedilol (COREG) 12.5 MG tablet TAKE 1 TABLET (12.5MG TOTAL) BY MOUTH TWICE A DAY WITH MEALS ?Patient taking differently: Take 12.5 mg by mouth 2 (two) times daily with a meal. 11/02/21  Yes Bedsole, Amy E, MD  ?cetirizine (ZYRTEC) 10 MG tablet Take 10 mg by mouth at bedtime.   Yes [provider]  ?cholestyramine light (PREVALITE) 4 g packet USE 1 PACKET TWICE DAILY WITH A MEAL, AT LEAST 2 HOURS AWAY FROM OTHER MEDS  Patient needs an appointment for further refills. ?Patient taking differently: Take 4 g by mouth 2 (two) times daily with a meal. 05/28/21  Yes Milus Banister, MD  ?citalopram (CELEXA) 40 MG tablet TAKE 1 TABLET BY MOUTH EVERY DAY ?Patient taking differently: Take 40 mg by mouth daily. 10/06/21  Yes Bedsole, Amy E, MD  ?diltiazem (CARDIZEM CD) 180 MG 24 hr capsule TAKE 1 CAPSULE BY MOUTH EVERY DAY ?Patient taking differently: Take 180 mg by mouth daily. 11/02/21  Yes Bedsole, Amy E, MD  ?ELIQUIS 2.5 MG TABS tablet TAKE 1 TABLET BY MOUTH TWICE A DAY ?Patient taking differently: Take 2.5 mg by mouth 2 (two) times daily. 11/02/21  Yes Bedsole, Amy E, MD  ?ferrous sulfate 325 (65 FE) MG EC tablet Take 325 mg by mouth 2 (two) times daily.   Yes Bedsole, Amy E, MD  ?isosorbide mononitrate (IMDUR) 30 MG 24 hr tablet TAKE 1 TABLET BY MOUTH EVERY DAY ?Patient taking differently: Take 30 mg by mouth daily. 11/03/21  Yes  Minus Breeding,  MD  ?losartan (COZAAR) 100 MG tablet Take 1 tablet (100 mg total) by mouth daily. 04/14/21  Yes Hochrein, James, MD  ?omeprazole (PRILOSEC) 20 MG capsule TAKE 1 CAPSULE BY MOUTH EVERY DAY ?Patient taking differently: Take 20 mg by mouth daily. 11/02/21  Yes Bedsole, Amy E, MD  ?ondansetron (ZOFRAN-ODT) 4 MG disintegrating tablet Take 1 tablet (4 mg total) by mouth every 8 (eight) hours as needed. ?Patient taking differently: Take 4 mg by mouth every 8 (eight) hours as needed for nausea or vomiting. 12/23/21  Yes Smith, Dylan, MD  ?Vitamin D, Ergocalciferol, (DRISDOL) 1.25 MG (50000 UNIT) CAPS capsule TAKE 1 CAPSULE (50,000 UNITS TOTAL) BY MOUTH EVERY 7 (SEVEN) DAYS ?Patient taking differently: Take 50,000 Units by mouth every Sunday. 11/04/21  Yes Bedsole, Amy E, MD  ?Blood Glucose Monitoring Suppl (ACCU-CHEK AVIVA PLUS) w/Device KIT Use to check blood once sugar daily. ?Patient not taking: Reported on 12/25/2021 03/14/18   Bedsole, Amy E, MD  ?glucose blood (ACCU-CHEK AVIVA PLUS) test strip Use to check blood sugar daily. ?Patient not taking: Reported on 12/25/2021 03/14/18   Bedsole, Amy E, MD  ?   ? ?Allergies    ?Amoxicillin-pot clavulanate   ? ?Review of Systems   ?Review of Systems  ?Constitutional:  Positive for appetite change and fatigue. Negative for chills and fever.  ?HENT:  Negative for facial swelling and trouble swallowing.   ?Eyes:  Negative for photophobia and visual disturbance.  ?Respiratory:  Negative for cough and shortness of breath.   ?Cardiovascular:  Negative for chest pain and palpitations.  ?Gastrointestinal:  Positive for abdominal pain and diarrhea. Negative for nausea and vomiting.  ?Endocrine: Negative for polydipsia and polyuria.  ?Genitourinary:  Negative for difficulty urinating and hematuria.  ?Musculoskeletal:  Negative for gait problem and joint swelling.  ?Skin:  Positive for color change. Negative for pallor and rash.  ?Neurological:  Negative for syncope and headaches.   ?Psychiatric/Behavioral:  Negative for agitation and confusion.   ? ?Physical Exam ?Updated Vital Signs ?BP 121/68   Pulse 82   Temp 98.2 ?F (36.8 ?C) (Oral)   Resp 16   Ht 5' 5" (1.651 m)   Wt 72.3 kg   SpO2 96%

## 2021-12-26 NOTE — ED Notes (Signed)
Patient transported to MRI 

## 2021-12-26 NOTE — Assessment & Plan Note (Signed)
? -   order urine electrolytes, ?Gently rehydrate Check TSH ?  ? ?

## 2021-12-26 NOTE — Consult Note (Signed)
? ? ?Consultation ? ?Referring Provider: [facility]/ [facility] ?Primary Care Physician:  [name], MD ?Primary Gastroenterologist:  Dr.Jacobs ? ?Reason for Consultation:  Jaundice, anorexia, nausea, malaise ? ?HPI: Tonya [name] is a 81 y.o. female, known to Dr. Ardis Hughs, who was admitted through the emergency room late yesterday, brought in after office visit with PCP, with complaints of onset of illness about 5 days ago, generalized weakness, malaise, fatigue, no appetite and vague complaints of abdominal discomfort.  Patient is not a great historian secondary to dementia and tells me she has not been having any abdominal pain, however her daughter says that she was complaining of some upper abdominal pain earlier in the week and yesterday.  No diarrhea or melena ?No documented fever or chills ?She was noted to be hypotensive at PCP office yesterday and family related very poor p.o. intake and decreased urine output.  She was also noted to be mildly jaundiced.Marland Kitchen ?Patient has history of atrial fibrillation, on chronic Eliquis, adult onset diabetes mellitus, prior history of adenocarcinoma of the colon status post right hemicolectomy in 2010.  She has had a component of chronic loose stools which have been managed with Questran, history of coronary artery disease status post prior stent, dementia/Alzheimer's and chronic kidney disease. ? ?Work-up in the emergency room-WBC 5.5, hemoglobin 9.6/hematocrit 28.3, platelets 141 ?Creatinine 1.45 ?T. bili 1.8/alk phos 64/AST 196/ALT 111 ?INR 1.2/pro time 15.6 ?CRP 6.2 ?Hepatitis A and B serologies negative ? ?Today T. bili 3.2/alk phos 111/AST 77/ALT 81 ?Doing labs her LFTs were normal when last checked in December 2022 ? ?Upper abdominal ultrasound this admission CBD 5.3 mm, she is status postcholecystectomy, liver normal ? ?CT of the abdomen and pelvis showed trace pericardial effusion and multivessel coronary artery calcifications, normal-appearing liver, status  postcholecystectomy, CBD 1.1 cm, some mild stranding noted at the porta hepatis and mild fat stranding adjacent to gastric antrum ? ?MRI/MRCP today-mild extrahepatic biliary ductal Daylette dilation, no choledocholithiasis no obstructing mass, no acute findings in the abdomen fat stranding described around gastric antrum on CT not evident by MRI ? ? ? ?Past Medical History:  ?Diagnosis Date  ? Adenocarcinoma, colon ([place])   ? Anemia   ? iron deficiency  ? Anxiety   ? Carpal tunnel syndrome   ? Cataract   ? REMOVED BILATERAL  ? Chronic diarrhea   ? Colon polyps   ? Tubular adenoma  ? Degenerative disk disease   ? Depression   ? Diabetes mellitus, type 2 ([place])   ? Dysrhythmia   ? h/o AFIB  ? GERD (gastroesophageal reflux disease)   ? History of blood transfusion   ? After surgeries  ? Hyperlipidemia   ? Hypertension   ? IBS (irritable bowel syndrome)   ? Kidney disorder   ? Osteoarthritis   ? Osteoporosis   ? PONV (postoperative nausea and vomiting)   ? S/P coronary artery stent placement 2007  ? ? ?Past Surgical History:  ?Procedure Laterality Date  ? CARDIAC CATHETERIZATION  12/10  ? LAD stent patent. Insignificant CAD, otherwise EF 60-65%  ? CARPAL TUNNEL RELEASE    ? CATARACT EXTRACTION  03/2015  ? CHOLECYSTECTOMY    ? COLON SURGERY    ? COLONOSCOPY    ? DILATION AND CURETTAGE OF UTERUS    ? HEMATOMA EVACUATION    ? after knee replacement  ? KNEE ARTHROSCOPY    ? bilateral  ? LUMBAR LAMINECTOMY N/A 11/26/2015  ? Procedure: LUMBAR DECOMPRESSIVE LAMINECTOMY L3-4 AND L4-5, LEFT  L2-3 HEMILAMINECTOMY;  Surgeon: Jessy Oto, MD;  Location: [place];  Service: Orthopedics;  Laterality: N/A;  ? ORIF ELBOW FRACTURE Left 03/18/2021  ? Procedure: OPEN REDUCTION INTERNAL FIXATION (ORIF) ELBOW/OLECRANON FRACTURE;  Surgeon: Renette Butters, MD;  Location: [facility];  Service: Orthopedics;  Laterality: Left;  ? PARTIAL COLECTOMY  11/2008  ? right, for adenocarcinoma  ? REPLACEMENT TOTAL KNEE  04/2010  ? right, complicated by  hemarthrosis   ? REVERSE SHOULDER ARTHROPLASTY Right 05/28/2016  ? REVERSE SHOULDER ARTHROPLASTY Right 05/28/2016  ? Procedure: REVERSE SHOULDER ARTHROPLASTY;  Surgeon: Meredith Pel, MD;  Location: [place];  Service: Orthopedics;  Laterality: Right;  ? TONSILLECTOMY    ? TOTAL KNEE ARTHROPLASTY Left 04/03/2014  ? dr whitfield  ? TOTAL KNEE ARTHROPLASTY Left 04/03/2014  ? Procedure: TOTAL KNEE ARTHROPLASTY;  Surgeon: Garald Balding, MD;  Location: [place];  Service: Orthopedics;  Laterality: Left;  ? TOTAL KNEE REVISION  03/08/2012  ? Procedure: TOTAL KNEE REVISION;  Surgeon: Garald Balding, MD;  Location: [place];  Service: Orthopedics;  Laterality: Right;  removal total knee hardware and placement of antibiotic cement spacer and antibiotic beads  ? TOTAL KNEE REVISION  05/17/2012  ? Procedure: TOTAL KNEE REVISION;  Surgeon: Garald Balding, MD;  Location: [place];  Service: Orthopedics;  Laterality: Right;  right total knee revision, removal of antibiotic spacer  ? ? ?Prior to Admission medications   ?Medication Sig Start Date End Date Taking? Authorizing Provider  ?atorvastatin (LIPITOR) 20 MG tablet TAKE 1 TABLET BY MOUTH EVERY DAY ?Patient taking differently: Take 20 mg by mouth daily. 04/14/21  Yes Minus Breeding, MD  ?carvedilol (COREG) 12.5 MG tablet TAKE 1 TABLET (12.$RemoveBefo'5MG'WEowZOfVnHC$  TOTAL) BY MOUTH TWICE A DAY WITH MEALS ?Patient taking differently: Take 12.5 mg by mouth 2 (two) times daily with a meal. 11/02/21  Yes Bedsole, Aloys Hupfer E, MD  ?cetirizine (ZYRTEC) 10 MG tablet Take 10 mg by mouth at bedtime.   Yes [provider]  ?cholestyramine light (PREVALITE) 4 g packet USE 1 PACKET TWICE DAILY WITH A MEAL, AT LEAST 2 HOURS AWAY FROM OTHER MEDS  Patient needs an appointment for further refills. ?Patient taking differently: Take 4 g by mouth 2 (two) times daily with a meal. 05/28/21  Yes Milus Banister, MD  ?citalopram (CELEXA) 40 MG tablet TAKE 1 TABLET BY MOUTH EVERY DAY ?Patient taking differently: Take 40 mg by  mouth daily. 10/06/21  Yes Bedsole, Demarlo Riojas E, MD  ?diltiazem (CARDIZEM CD) 180 MG 24 hr capsule TAKE 1 CAPSULE BY MOUTH EVERY DAY ?Patient taking differently: Take 180 mg by mouth daily. 11/02/21  Yes Bedsole, Mandolin Falwell E, MD  ?ELIQUIS 2.5 MG TABS tablet TAKE 1 TABLET BY MOUTH TWICE A DAY ?Patient taking differently: Take 2.5 mg by mouth 2 (two) times daily. 11/02/21  Yes Bedsole, Verda Mehta E, MD  ?ferrous sulfate 325 (65 FE) MG EC tablet Take 325 mg by mouth 2 (two) times daily.   Yes Bedsole, Kaipo Ardis E, MD  ?isosorbide mononitrate (IMDUR) 30 MG 24 hr tablet TAKE 1 TABLET BY MOUTH EVERY DAY ?Patient taking differently: Take 30 mg by mouth daily. 11/03/21  Yes Minus Breeding, MD  ?losartan (COZAAR) 100 MG tablet Take 1 tablet (100 mg total) by mouth daily. 04/14/21  Yes Minus Breeding, MD  ?omeprazole (PRILOSEC) 20 MG capsule TAKE 1 CAPSULE BY MOUTH EVERY DAY ?Patient taking differently: Take 20 mg by mouth daily. 11/02/21  Yes Bedsole, Wilfrid Hyser E, MD  ?ondansetron (  ZOFRAN-ODT) 4 MG disintegrating tablet Take 1 tablet (4 mg total) by mouth every 8 (eight) hours as needed. ?Patient taking differently: Take 4 mg by mouth every 8 (eight) hours as needed for nausea or vomiting. 12/23/21  Yes Vladimir Crofts, MD  ?Vitamin D, Ergocalciferol, (DRISDOL) 1.25 MG (50000 UNIT) CAPS capsule TAKE 1 CAPSULE (50,000 UNITS TOTAL) BY MOUTH EVERY 7 (SEVEN) DAYS ?Patient taking differently: Take 50,000 Units by mouth every Sunday. 11/04/21  Yes Bedsole, Keeara Frees E, MD  ?Blood Glucose Monitoring Suppl (ACCU-CHEK AVIVA PLUS) w/Device KIT Use to check blood once sugar daily. ?Patient not taking: Reported on 12/25/2021 03/14/18   [name], MD  ?glucose blood (ACCU-CHEK AVIVA PLUS) test strip Use to check blood sugar daily. ?Patient not taking: Reported on 12/25/2021 03/14/18   [name], MD  ? ? ?Current Facility-Administered Medications  ?Medication Dose Route Frequency Provider Last Rate Last Admin  ? 0.9 %  sodium chloride infusion  75 mL/hr Intravenous Continuous  Doutova, Anastassia, MD 75 mL/hr at 12/26/21 0210 75 mL/hr at 12/26/21 0210  ? acetaminophen (TYLENOL) tablet 650 mg  650 mg Oral Q6H PRN Toy Baker, MD      ? Or  ? acetaminophen (TYLENOL) suppository 650 mg  650 mg R

## 2021-12-27 LAB — COMPREHENSIVE METABOLIC PANEL
ALT: 66 U/L — ABNORMAL HIGH (ref 0–44)
AST: 48 U/L — ABNORMAL HIGH (ref 15–41)
Albumin: 2.7 g/dL — ABNORMAL LOW (ref 3.5–5.0)
Alkaline Phosphatase: 130 U/L — ABNORMAL HIGH (ref 38–126)
Anion gap: 6 (ref 5–15)
BUN: 25 mg/dL — ABNORMAL HIGH (ref 8–23)
CO2: 19 mmol/L — ABNORMAL LOW (ref 22–32)
Calcium: 8.6 mg/dL — ABNORMAL LOW (ref 8.9–10.3)
Chloride: 111 mmol/L (ref 98–111)
Creatinine, Ser: 1.38 mg/dL — ABNORMAL HIGH (ref 0.44–1.00)
GFR, Estimated: 38 mL/min — ABNORMAL LOW (ref >60)
Glucose, Bld: 111 mg/dL — ABNORMAL HIGH (ref 70–99)
Potassium: 3.5 mmol/L (ref 3.5–5.1)
Sodium: 136 mmol/L (ref 135–145)
Total Bilirubin: 1.5 mg/dL — ABNORMAL HIGH (ref 0.3–1.2)
Total Protein: 5.2 g/dL — ABNORMAL LOW (ref 6.5–8.1)

## 2021-12-27 LAB — GASTROINTESTINAL PANEL BY PCR, STOOL (REPLACES STOOL CULTURE)

## 2021-12-27 LAB — CBC
HCT: 29.4 % — ABNORMAL LOW (ref 36.0–46.0)
Hemoglobin: 10.2 g/dL — ABNORMAL LOW (ref 12.0–15.0)
MCH: 31.5 pg (ref 26.0–34.0)
MCHC: 34.7 g/dL (ref 30.0–36.0)
MCV: 90.7 fL (ref 80.0–100.0)
Platelets: 185 10*3/uL (ref 150–400)
RBC: 3.24 MIL/uL — ABNORMAL LOW (ref 3.87–5.11)
RDW: 12.8 % (ref 11.5–15.5)
WBC: 7.6 10*3/uL (ref 4.0–10.5)
nRBC: 0 % (ref 0.0–0.2)

## 2021-12-27 LAB — PHOSPHORUS: Phosphorus: 2.4 mg/dL — ABNORMAL LOW (ref 2.5–4.6)

## 2021-12-27 LAB — OSMOLALITY, URINE: Osmolality, Ur: 627 mOsm/kg (ref 300–900)

## 2021-12-27 LAB — GLUCOSE, CAPILLARY
Glucose-Capillary: 106 mg/dL — ABNORMAL HIGH (ref 70–99)
Glucose-Capillary: 108 mg/dL — ABNORMAL HIGH (ref 70–99)
Glucose-Capillary: 116 mg/dL — ABNORMAL HIGH (ref 70–99)
Glucose-Capillary: 141 mg/dL — ABNORMAL HIGH (ref 70–99)
Glucose-Capillary: 158 mg/dL — ABNORMAL HIGH (ref 70–99)
Glucose-Capillary: 98 mg/dL (ref 70–99)

## 2021-12-27 LAB — MAGNESIUM: Magnesium: 2 mg/dL (ref 1.7–2.4)

## 2021-12-27 LAB — SODIUM, URINE, RANDOM: Sodium, Ur: 75 mmol/L

## 2021-12-27 LAB — BILIRUBIN, DIRECT: Bilirubin, Direct: 0.6 mg/dL — ABNORMAL HIGH (ref 0.0–0.2)

## 2021-12-27 LAB — CREATININE, URINE, RANDOM: Creatinine, Urine: 130.44 mg/dL

## 2021-12-27 MED ORDER — PANTOPRAZOLE SODIUM 40 MG PO TBEC
40.0000 mg | DELAYED_RELEASE_TABLET | Freq: Every day | ORAL | Status: DC
  Administered 2021-12-27 – 2021-12-28 (×2): 40 mg via ORAL
  Filled 2021-12-27 (×2): qty 1

## 2021-12-27 MED ORDER — SODIUM PHOSPHATES 45 MMOLE/15ML IV SOLN
15.0000 mmol | Freq: Once | INTRAVENOUS | Status: AC
  Administered 2021-12-27: 15 mmol via INTRAVENOUS
  Filled 2021-12-27: qty 5

## 2021-12-27 MED ORDER — POTASSIUM CHLORIDE CRYS ER 20 MEQ PO TBCR
40.0000 meq | EXTENDED_RELEASE_TABLET | Freq: Once | ORAL | Status: AC
  Administered 2021-12-27: 40 meq via ORAL
  Filled 2021-12-27: qty 2

## 2021-12-27 MED ORDER — APIXABAN 2.5 MG PO TABS
2.5000 mg | ORAL_TABLET | Freq: Two times a day (BID) | ORAL | Status: DC
  Administered 2021-12-27 – 2021-12-29 (×5): 2.5 mg via ORAL
  Filled 2021-12-27 (×5): qty 1

## 2021-12-27 NOTE — Evaluation (Signed)
Occupational Therapy Evaluation ?Patient Details ?Name: Tonya [name] ?MRN: [id number] ?DOB: September 27, 1941 ?Today's Date: 12/27/2021 ? ? ?History of Present Illness 81 y/o female presented to ED on 12/25/21 from [facility] for hypotension related to decreased oral intake. PMH: CAD, HTN, Afib, T2DM, CKD, hx of colon cancer  ? ?Clinical Impression ?  ?Pt admitted for concerns listed above. PTA pt reported that she was independent with all ADL's and functional mobility using no AD (however she did report that he daughter wanted her to start using a cane). At this time, pt presents near her suspected baseline, as she is able to complete BADL's with mod I and functional mobility using no AD at a mod I level. Due to falls and decreased cognition/safety awareness, pt may benefit from distant supervision or ALF services. Pt has no further OT needs and acute OT will sign off.  ?   ? ?Recommendations for follow up therapy are one component of a multi-disciplinary discharge planning process, led by the attending physician.  Recommendations may be updated based on patient status, additional functional criteria and insurance authorization.  ? ?Follow Up Recommendations ? No OT follow up  ?  ?Assistance Recommended at Discharge PRN  ?Patient can return home with the following Direct supervision/assist for medications management;Direct supervision/assist for financial management;Assistance with cooking/housework ? ?  ?Functional Status Assessment ? Patient has had a recent decline in their functional status and demonstrates the ability to make significant improvements in function in a reasonable and predictable amount of time.  ?Equipment Recommendations ? None recommended by OT  ?  ?Recommendations for Other Services   ? ? ?  ?Precautions / Restrictions Precautions ?Precautions: Fall ?Restrictions ?Weight Bearing Restrictions: No  ? ?  ? ?Mobility Bed Mobility ?Overal bed mobility: Modified Independent ?  ?  ?  ?  ?  ?  ?  ?   ? ?Transfers ?Overall transfer level: Modified independent ?Equipment used: None ?  ?  ?  ?  ?  ?  ?  ?  ?  ? ?  ?Balance Overall balance assessment: Mild deficits observed, not formally tested, History of Falls ?  ?  ?  ?  ?  ?  ?  ?  ?  ?  ?  ?  ?  ?  ?  ?  ?  ?  ?   ? ?ADL either performed or assessed with clinical judgement  ? ?ADL Overall ADL's : Modified independent;At baseline ?  ?  ?  ?  ?  ?  ?  ?  ?  ?  ?  ?  ?  ?  ?  ?  ?  ?  ?  ?General ADL Comments: Pt was able to complete BADL's independently this session, using no AD.  ? ? ? ?Vision Baseline Vision/History: 1 Wears glasses ?Ability to See in Adequate Light: 0 Adequate ?Patient Visual Report: No change from baseline ?Vision Assessment?: No apparent visual deficits  ?   ?Perception   ?  ?Praxis   ?  ? ?Pertinent Vitals/Pain Pain Assessment ?Pain Assessment: No/denies pain  ? ? ? ?Hand Dominance Right ?  ?Extremity/Trunk Assessment Upper Extremity Assessment ?Upper Extremity Assessment: Overall WFL for tasks assessed ?  ?Lower Extremity Assessment ?Lower Extremity Assessment: Defer to PT evaluation ?  ?Cervical / Trunk Assessment ?Cervical / Trunk Assessment: Kyphotic ?  ?Communication Communication ?Communication: No difficulties ?  ?Cognition Arousal/Alertness: Awake/alert ?Behavior During Therapy: [facility] for tasks assessed/performed ?Overall Cognitive Status: No family/caregiver  present to determine baseline cognitive functioning ?  ?  ?  ?  ?  ?  ?  ?  ?  ?  ?  ?  ?  ?  ?  ?  ?General Comments: states "2033" for year and increased time to come up with place. Unsure of baseline cognition due to no family present ?  ?  ?General Comments  VSS onRA, large bruising on R hip/leg ? ?  ?Exercises   ?  ?Shoulder Instructions    ? ? ?Home Living Family/patient expects to be discharged to:: Private residence ?Living Arrangements: Children;Other relatives ?Available Help at Discharge: Family ?Type of Home: House ?Home Access: Stairs to enter ?Entrance  Stairs-Number of Steps: 1 ?  ?Home Layout: Two level;Able to live on main level with bedroom/bathroom (her bedroom/bathroom is on main level; daughter works from home upstairs) ?  ?  ?Bathroom Shower/Tub: Tub/shower unit ?  ?Bathroom Toilet: Standard ?  ?  ?Home Equipment: Kasandra Knudsen - single point ?  ?  ?  ? ?  ?Prior Functioning/Environment Prior Level of Function : Independent/Modified Independent;History of Falls (last six months) ?  ?  ?  ?  ?  ?  ?Mobility Comments: reports at least 3 falls in past week ?ADLs Comments: Indep ?  ? ?  ?  ?OT Problem List: Decreased strength;Impaired balance (sitting and/or standing);Decreased cognition;Decreased safety awareness ?  ?   ?OT Treatment/Interventions:    ?  ?OT Goals(Current goals can be found in the care plan section) Acute Rehab OT Goals ?Patient Stated Goal: To go home ?OT Goal Formulation: With patient ?Time For Goal Achievement: 12/27/21 ?Potential to Achieve Goals: Good  ?OT Frequency:   ?  ? ?Co-evaluation   ?  ?  ?  ?  ? ?  ?AM-PAC OT "6 Clicks" Daily Activity     ?Outcome Measure Help from another person eating meals?: None ?Help from another person taking care of personal grooming?: None ?Help from another person toileting, which includes using toliet, bedpan, or urinal?: None ?Help from another person bathing (including washing, rinsing, drying)?: None ?Help from another person to put on and taking off regular upper body clothing?: None ?Help from another person to put on and taking off regular lower body clothing?: None ?6 Click Score: 24 ?  ?End of Session Equipment Utilized During Treatment: Gait belt ?Nurse Communication: Mobility status ? ?Activity Tolerance: Patient tolerated treatment well ?Patient left: in chair;with call bell/phone within reach;with chair alarm set ? ?OT Visit Diagnosis: Unsteadiness on feet (R26.81);Other abnormalities of gait and mobility (R26.89);Muscle weakness (generalized) (M62.81)  ?              ?Time: 4166-0630 ?OT Time  Calculation (min): 21 min ?Charges:  OT General Charges ?$OT Visit: 1 Visit ?OT Evaluation ?$OT Eval Moderate Complexity: 1 Mod ? ?Kentley Blyden H., OTR/L ?Acute Rehabilitation ? ?Galan Ghee Elane Yolanda Bonine ?12/27/2021, 2:19 PM ?

## 2021-12-27 NOTE — Progress Notes (Signed)
?PROGRESS NOTE ? ?Tonya [name]:[id number] DOB: 1940/10/29 DOA: 12/25/2021 ?PCP: [name], MD ? ? LOS: 1 day  ? ?Brief Narrative / Interim history: ?Pleasant 81 year old female with history of CAD, HTN, A-fib, DM 2, CKD 3B, prior colon cancer who comes into the hospital with profuse diarrhea over the past week associated with weakness, lightheadedness.  She does not report any vomiting but has been having a poor appetite and was barely eating anything.  No significant abdominal pain that she can tell me.  She was found to have elevated LFTs, and GI was consulted. ? ?Subjective / 24h Interval events: ?Continues to have diarrhea overnight ? ?Assesement and Plan: ?Principal Problem: ?  Elevated LFTs ?Active Problems: ?  ADENOCARCINOMA, COLON, CECUM ?  Controlled type 2 diabetes mellitus with diabetic nephropathy ([place]) ?  Hyperlipidemia associated with type 2 diabetes mellitus ([place]) ?  GERD ?  Atrial fibrillation ([place]) ?  CKD stage 4 due to type 2 diabetes mellitus ([place]) ?  Ischemic vascular dementia ([place]) ?  Hypotension due to hypovolemia ?  Dehydration ?  Diarrhea ?  Hyponatremia ?  Cardiomegaly ?  Malnutrition of moderate degree ? ? ?Assessment and Plan: ?Principal problem ?Diarrhea-unclear etiology, possible viral. Imaging of the abdomen unremarkable.  CT is negative, GI pathogen panel pending.  Still having diarrhea with only 3 recorded loose stools over the last 24 hours.  Appreciate GI follow-up, continue supportive management ?  ?Active problems ?Elevated LFTs-mild, in the 70s, appears to be trending down from couple days ago.  MRI/MRCP unremarkable.  Hepatitis panel negative ?  ?Hypokalemia, hypomagnesemia-due to diarrheal illness, continue to monitor and replete today as indicated ?  ?Paroxysmal A-fib - start carvedilol, resume Eliquis today ?  ?Ischemic vascular dementia-mild, good recollection of events and good insight into her medical issuess.  Disoriented overnight ?  ?Hypotension-due to  hypovolemia, blood pressure improving with fluids, continue ?  ?Hyperlipidemia-hold statin for now ?  ?Colon adenocarcinoma-stable, monitor as an outpatient ?  ?Cardiomegaly-echo ordered by the admitter, normal EF 55-60%, no WMA.  RV was normal. ?  ?Hyponatremia-due to dehydration, sodium has normalized today ? ?CKD 3B-baseline creatinine 1.5-1.9, remains at baseline ?  ?Normocytic anemia-due to chronic kidney disease, no bleeding, stable ? ?Thrombocytopenia-likely reactive.  Resolved today. ? ?Scheduled Meds: ? apixaban  2.5 mg Oral BID  ? carvedilol  6.25 mg Oral BID WC  ? feeding supplement  237 mL Oral TID BM  ? insulin aspart  0-9 Units Subcutaneous Q4H  ? multivitamin with minerals  1 tablet Oral Daily  ? pantoprazole (PROTONIX) IV  40 mg Intravenous QHS  ? ?Continuous Infusions: ?PRN Meds:.acetaminophen **OR** acetaminophen, HYDROcodone-acetaminophen ? ?Diet Orders (From admission, onward)  ? ?  Start     Ordered  ? 12/26/21 1234  Diet Carb Modified Fluid consistency: Thin; Room service appropriate? Yes  Diet effective now       ?Question Answer Comment  ?Diet-HS Snack? Nothing   ?Calorie Level Medium 1600-2000   ?Fluid consistency: Thin   ?Room service appropriate? Yes   ?  ? 12/26/21 1234  ? ?  ?  ? ?  ? ? ?DVT prophylaxis: apixaban (ELIQUIS) tablet 2.5 mg Start: 12/27/21 1215 ?SCDs Start: 12/26/21 0126 ?apixaban (ELIQUIS) tablet 2.5 mg  ? ?Lab Results  ?Component Value Date  ? PLT 185 12/27/2021  ? ? ?  Code Status: Full Code ? ?Family Communication: no family at bedside ? ?Status is: Inpatient ? ?Remains inpatient appropriate because: persistent weakness,  PT eval pending, persistent diarrhea ? ?Level of care: Telemetry Medical ? ?Consultants:  ?GI ? ?Procedures:  ?2D echo ? ?Microbiology  ?none ? ?Antimicrobials: ?none  ? ? ?Objective: ?Vitals:  ? 12/27/21 0004 12/27/21 0402 12/27/21 9833 12/27/21 0809  ?BP: 125/73 127/81 133/84 133/84  ?Pulse: (!) 107 (!) 109 (!) 108 (!) 108  ?Resp: '17 16  18  '$ ?Temp:  97.6 ?F (36.4 ?C) 98.2 ?F (36.8 ?C)  98.7 ?F (37.1 ?C)  ?TempSrc: Axillary Oral  Oral  ?SpO2: 96% 96%  97%  ?Weight:      ?Height:      ? ? ?Intake/Output Summary (Last 24 hours) at 12/27/2021 1121 ?Last data filed at 12/27/2021 0900 ?Gross per 24 hour  ?Intake 1120 ml  ?Output 0 ml  ?Net 1120 ml  ? ?Wt Readings from Last 3 Encounters:  ?12/25/21 72.3 kg  ?12/25/21 72.3 kg  ?12/23/21 70.3 kg  ? ? ?Examination: ? ?Constitutional: NAD ?Eyes: no scleral icterus ?ENMT: Mucous membranes are moist.  ?Neck: normal, supple ?Respiratory: clear to auscultation bilaterally, no wheezing, no crackles.  ?Cardiovascular: Regular rate and rhythm, no murmurs / rubs / gallops. No LE edema.  ?Abdomen: non distended, no tenderness. Bowel sounds positive.  ?Musculoskeletal: no clubbing / cyanosis.  ?Skin: no rashes ?Neurologic: non focal  ? ? ?Data Reviewed: I have independently reviewed following labs and imaging studies ? ?CBC ?Recent Labs  ?Lab 12/23/21 ?1150 12/25/21 ?1837 12/26/21 ?8250 12/27/21 ?0335  ?WBC 8.3 5.5 5.2 7.6  ?HGB 10.0* 9.6* 9.2* 10.2*  ?HCT 30.1* 28.3* 26.9* 29.4*  ?PLT 170 141* 150 185  ?MCV 92.9 93.7 92.8 90.7  ?MCH 30.9 31.8 31.7 31.5  ?MCHC 33.2 33.9 34.2 34.7  ?RDW 12.5 12.6 12.8 12.8  ?LYMPHSABS  --  0.3* 0.4*  --   ?MONOABS  --  0.5 0.5  --   ?EOSABS  --  0.1 0.2  --   ?BASOSABS  --  0.0 0.0  --   ? ? ?Recent Labs  ?Lab 12/23/21 ?1150 12/25/21 ?1837 12/25/21 ?2032 12/26/21 ?5397 12/27/21 ?0335  ?NA 136 132*  133*  --  132* 136  ?K 3.5 3.5  3.5  --  3.0* 3.5  ?CL 104 107  107  --  108 111  ?CO2 21* 16*  17*  --  16* 19*  ?GLUCOSE 168* 147*  148*  --  97 111*  ?BUN 29* 33*  33*  --  28* 25*  ?CREATININE 1.45* 1.68*  1.67*  --  1.45* 1.38*  ?CALCIUM 8.4* 8.0*  8.0*  --  8.1* 8.6*  ?AST 196* 77*  75*  --  70* 48*  ?ALT 111* 81*  80*  --  75* 66*  ?ALKPHOS 64 101  100  --  103 130*  ?BILITOT 1.8* 3.2*  3.0*  --  3.0* 1.5*  ?ALBUMIN 3.6 2.8*  2.8*  --  2.6* 2.7*  ?MG  --   --   --  1.2* 2.0  ?CRP  --   --    --  2.5*  --   ?INR 1.2  --  1.1  --   --   ?TSH  --  4.414  --   --   --   ?HGBA1C  --   --   --  6.3*  --   ?AMMONIA  --  20  --   --   --   ?BNP  --  259.0*  --   --   --   ? ? ?------------------------------------------------------------------------------------------------------------------ ?  No results for input(s): CHOL, HDL, LDLCALC, TRIG, CHOLHDL, LDLDIRECT in the last 72 hours. ? ?Lab Results  ?Component Value Date  ? HGBA1C 6.3 (H) 12/26/2021  ? ?------------------------------------------------------------------------------------------------------------------ ?Recent Labs  ?  12/25/21 ?1837  ?TSH 4.414  ? ? ?Cardiac Enzymes ?No results for input(s): CKMB, TROPONINI, MYOGLOBIN in the last 168 hours. ? ?Invalid input(s): CK ?------------------------------------------------------------------------------------------------------------------ ?   ?Component Value Date/Time  ? BNP 259.0 (H) 12/25/2021 1837  ? ? ?CBG: ?Recent Labs  ?Lab 12/26/21 ?1931 12/27/21 ?0011 12/27/21 ?0405 12/27/21 ?6301 12/27/21 ?1116  ?GLUCAP 153* 98 106* 108* 141*  ? ? ?Recent Results (from the past 240 hour(s))  ?Respiratory (~20 pathogens) panel by PCR     Status: None  ? Collection Time: 12/26/21 12:35 PM  ? Specimen: Nasopharyngeal Swab; Respiratory  ?Result Value Ref Range Status  ? Adenovirus NOT DETECTED NOT DETECTED Final  ? Coronavirus 229E NOT DETECTED NOT DETECTED Final  ?  Comment: (NOTE) ?The Coronavirus on the Respiratory Panel, DOES NOT test for the novel  ?Coronavirus (2019 nCoV) ?  ? Coronavirus HKU1 NOT DETECTED NOT DETECTED Final  ? Coronavirus NL63 NOT DETECTED NOT DETECTED Final  ? Coronavirus OC43 NOT DETECTED NOT DETECTED Final  ? Metapneumovirus NOT DETECTED NOT DETECTED Final  ? Rhinovirus / Enterovirus NOT DETECTED NOT DETECTED Final  ? Influenza A NOT DETECTED NOT DETECTED Final  ? Influenza B NOT DETECTED NOT DETECTED Final  ? Parainfluenza Virus 1 NOT DETECTED NOT DETECTED Final  ? Parainfluenza Virus 2 NOT  DETECTED NOT DETECTED Final  ? Parainfluenza Virus 3 NOT DETECTED NOT DETECTED Final  ? Parainfluenza Virus 4 NOT DETECTED NOT DETECTED Final  ? Respiratory Syncytial Virus NOT DETECTED NOT DETECTED Final  ? Bordetella pert

## 2021-12-27 NOTE — Progress Notes (Signed)
? ? Progress Note ? ? Subjective  ?Chief Complaint: Jaundice, anorexia, nausea and malaise. ? ?Patient sitting up in bed about to participate with PT OT. ?Patient states she has been having loose stools for the past 4 to 5 months. ?Had 2 bouts of loose stools this morning before breakfast. ?Denies abdominal pain. ?Has had 1 nocturnal episode of loose stools in the past. ?Denies nausea vomiting at this time. ? ? Objective  ? ?Vital signs in last 24 hours: ?Temp:  [97.6 ?F (36.4 ?C)-98.7 ?F (37.1 ?C)] 98.7 ?F (37.1 ?C) (04/01 0809) ?Pulse Rate:  [81-118] 108 (04/01 0809) ?Resp:  [13-18] 18 (04/01 0809) ?BP: (118-142)/(73-93) 133/84 (04/01 0809) ?SpO2:  [95 %-99 %] 97 % (04/01 0809) ?Last BM Date : 12/24/21 ? ?General:   female in no acute distress  ?Heart:  Regular rate and rhythm; no murmurs ?Pulm: Clear anteriorly; no wheezing ?Abdomen:  Soft, Non-distended AB, Active bowel sounds. No tenderness . , No organomegaly appreciated. ?Extremities:  without  edema. ?Neurologic:  Alert and  oriented x4;  No focal deficits.  ?Psych:  Cooperative. Normal mood and affect. ? ? ?Intake/Output from previous day: ?03/31 0701 - 04/01 0700 ?In: 49 [P.O.:660; IV Piggyback:100] ?Out: -  ?Intake/Output this shift: ?Total I/O ?In: 360 [P.O.:360] ?Out: 0  ? ?Lab Results: ?Recent Labs  ?  12/25/21 ?1837 12/26/21 ?7510 12/27/21 ?0335  ?WBC 5.5 5.2 7.6  ?HGB 9.6* 9.2* 10.2*  ?HCT 28.3* 26.9* 29.4*  ?PLT 141* 150 185  ? ?BMET ?Recent Labs  ?  12/25/21 ?1837 12/26/21 ?2585 12/27/21 ?0335  ?NA 132*  133* 132* 136  ?K 3.5  3.5 3.0* 3.5  ?CL 107  107 108 111  ?CO2 16*  17* 16* 19*  ?GLUCOSE 147*  148* 97 111*  ?BUN 33*  33* 28* 25*  ?CREATININE 1.68*  1.67* 1.45* 1.38*  ?CALCIUM 8.0*  8.0* 8.1* 8.6*  ? ?LFT ?Recent Labs  ?  12/25/21 ?1837 12/26/21 ?2778 12/27/21 ?0335  ?PROT 5.1*  5.2*   < > 5.2*  ?ALBUMIN 2.8*  2.8*   < > 2.7*  ?AST 77*  75*   < > 48*  ?ALT 81*  80*   < > 66*  ?ALKPHOS 101  100   < > 130*  ?BILITOT 3.2*  3.0*    < > 1.5*  ?BILIDIR 1.8*   < > 0.6*  ?IBILI 1.4*  --   --   ? < > = values in this interval not displayed.  ? ?PT/INR ?Recent Labs  ?  12/25/21 ?2032  ?LABPROT 14.3  ?INR 1.1  ? ? ?Studies/Results: ?CT ABDOMEN PELVIS WO CONTRAST ? ?Result Date: 12/25/2021 ?CLINICAL DATA:  Right upper quadrant abdominal pain, loss of appetite. EXAM: CT ABDOMEN AND PELVIS WITHOUT CONTRAST TECHNIQUE: Multidetector CT imaging of the abdomen and pelvis was performed following the standard protocol without IV contrast. RADIATION DOSE REDUCTION: This exam was performed according to the departmental dose-optimization program which includes automated exposure control, adjustment of the mA and/or kV according to patient size and/or use of iterative reconstruction technique. COMPARISON:  12/23/2021. FINDINGS: Lower chest: The heart is normal in size and there is a trace pericardial effusion. Scattered multi-vessel coronary artery calcifications are noted. There are trace bilateral pleural effusions with atelectasis at the lung bases. Hepatobiliary: No focal liver abnormality is seen. Status post cholecystectomy. There is mild dilatation of the common bile duct measuring 1.1 cm. Fat stranding is noted at the porta hepatis. Pancreas: Unremarkable. No pancreatic  ductal dilatation or surrounding inflammatory changes. Spleen: The spleen is normal in size. Multiple calcified granuloma are present. Adrenals/Urinary Tract: The adrenal glands are within normal limits. No renal calculus or hydronephrosis. No ureteral calculus or obstructive uropathy. Stable perinephric fat stranding is noted bilaterally. The bladder is unremarkable. Stomach/Bowel: The stomach is nondistended. Mild fat stranding is noted at the gastric antrum and proximal duodenum. There is a pocket of air adjacent to the duodenum near the pancreatic head measuring 2.1 cm, likely duodenal diverticulum. No bowel obstruction, free air, or pneumatosis. Scattered diverticula are noted along  the colon without evidence of diverticulitis. Right hemicolectomy changes are noted. Vascular/Lymphatic: Curvilinear calcification at the right renal artery suggesting small pseudoaneurysm. Aortic atherosclerosis. Prominent lymph nodes are present at the porta pedis. Reproductive: Uterus and bilateral adnexa are unremarkable. Other: There is a small fat containing umbilical hernia. No ascites. Musculoskeletal: Stable skin thickening and subcutaneous fat stranding over the right hip. Degenerative changes are present in the thoracolumbar spine. There is stable deformity in the superior endplate of L4, possible old compression deformity. No acute osseous abnormality. IMPRESSION: 1. The stomach is nondistended. There is fat stranding adjacent to the gastric antrum and proximal duodenum, suggesting gastritis/duodenitis. No bowel obstruction or free air. 2. Status post cholecystectomy with mild dilatation of the common bile duct measuring up to 1.1 cm. Fat stranding and prominent lymph nodes are noted at the porta hepatis, which may be infectious or inflammatory. Ultrasound is recommended for further evaluation. 3. Trace bilateral pleural effusions with atelectasis at the lung bases. 4. Aortic atherosclerosis and coronary artery calcifications. Electronically Signed   By: Brett Fairy M.D.   On: 12/25/2021 23:03  ? ?DG Chest Portable 1 View ? ?Result Date: 12/25/2021 ?CLINICAL DATA:  Fatigue EXAM: PORTABLE CHEST 1 VIEW COMPARISON:  05/22/2016 FINDINGS: Mildly enlarged cardiac silhouette. Mediastinal contours are within normal limits. No focal pulmonary opacity. No pleural effusion or pneumothorax. No acute osseous abnormality. IMPRESSION: Mild cardiomegaly.  Otherwise no acute cardiopulmonary process. Electronically Signed   By: Merilyn Baba M.D.   On: 12/25/2021 18:52  ? ?MR ABDOMEN MRCP W WO CONTAST ? ?Result Date: 12/26/2021 ?CLINICAL DATA:  Jaundice. EXAM: MRI ABDOMEN WITHOUT AND WITH CONTRAST (INCLUDING MRCP)  TECHNIQUE: Multiplanar multisequence MR imaging of the abdomen was performed both before and after the administration of intravenous contrast. Heavily T2-weighted images of the biliary and pancreatic ducts were obtained, and three-dimensional MRCP images were rendered by post processing. CONTRAST:  29m GADAVIST GADOBUTROL 1 MMOL/ML IV SOLN COMPARISON:  Abdomen/pelvis CT 12/25/2021 FINDINGS: Lower chest: Unremarkable. Hepatobiliary: No suspicious focal abnormality within the liver parenchyma. Gallbladder surgically absent. Common duct measures up to 10 mm diameter. Common bile duct in the head of the pancreas is 6 mm diameter. No choledocholithiasis. Pancreas: No focal mass lesion. No dilatation of the main duct. No intraparenchymal cyst. No peripancreatic edema. Spleen:  No splenomegaly. No focal mass lesion. Adrenals/Urinary Tract: No adrenal nodule or mass. Tiny cysts left kidney are likely benign. No follow-up recommended. Right kidney unremarkable. Stomach/Bowel: Stomach is unremarkable. No gastric wall thickening. No evidence of outlet obstruction. Duodenum is normally positioned as is the ligament of Treitz. Duodenal diverticulum noted. No small bowel or colonic dilatation within the visualized abdomen. Vascular/Lymphatic: No abdominal aortic aneurysm. No abdominal lymphadenopathy. Other:  No intraperitoneal free fluid. Musculoskeletal: No focal suspicious marrow enhancement within the visualized bony anatomy. IMPRESSION: 1. Mild extrahepatic biliary duct dilatation. No choledocholithiasis. No obstructing mass lesion evident. 2. No acute findings in  the abdomen. The fat stranding described around the gastric antrum and proximal duodenum on CT scan yesterday is not readily evident by MRI. Electronically Signed   By: Misty Stanley M.D.   On: 12/26/2021 06:13  ? ?ECHOCARDIOGRAM COMPLETE ? ?Result Date: 12/26/2021 ?   ECHOCARDIOGRAM REPORT   Patient Name:   Tonya [name] Date of Exam: 12/26/2021 Medical Rec #:   [id number]       Height:       65.0 in Accession #:    [id number]      Weight:       159.4 lb Date of Birth:  March 24, 1941       BSA:          1.796 m? Patient Age:    23 years        BP:           113/68 mmHg Patient Gender: F

## 2021-12-27 NOTE — Progress Notes (Signed)
SATURATION QUALIFICATIONS: (This note is used to comply with regulatory documentation for home oxygen) ? ?Patient Saturations on Room Air at Rest = 99% ? ?Patient Saturations on Room Air while Ambulating = 96% ? ?Patient Saturations on 0 Liters of oxygen while Ambulating = 96% ? ?Patient does not need home oxygen. ?

## 2021-12-27 NOTE — Plan of Care (Signed)
  Problem: Clinical Measurements: Goal: Respiratory complications will improve Outcome: Progressing   Problem: Activity: Goal: Risk for activity intolerance will decrease Outcome: Progressing   Problem: Nutrition: Goal: Adequate nutrition will be maintained Outcome: Progressing   

## 2021-12-27 NOTE — Evaluation (Signed)
Physical Therapy Evaluation & Discharge ?Patient Details ?Name: Tonya [name] ?MRN: [id number] ?DOB: Sep 18, 1941 ?Today's Date: 12/27/2021 ? ?History of Present Illness ? 81 y/o female presented to ED on 12/25/21 from [facility] for hypotension related to decreased oral intake. PMH: CAD, HTN, Afib, T2DM, CKD, hx of colon cancer  ?Clinical Impression ? Patient admitted with the above findings. Patient at supervision level for mobility with no AD. Patient reports at least 3 falls recently. Patient lives with daughter and her family with daughter working from home. Patient seems to be at baseline for mobility and will have necessary supervision at discharge. No further skilled PT needs identified acutely. No PT follow up recommended at this time. Patient would benefit from ALF as family has already inquired about this.    ?   ? ?Recommendations for follow up therapy are one component of a multi-disciplinary discharge planning process, led by the attending physician.  Recommendations may be updated based on patient status, additional functional criteria and insurance authorization. ? ?Follow Up Recommendations No PT follow up (would benefit from ALF as family has already inquired about) ? ?  ?Assistance Recommended at Discharge Intermittent Supervision/Assistance  ?Patient can return home with the following ? Assistance with cooking/housework;Help with stairs or ramp for entrance;Assist for transportation;Direct supervision/assist for financial management;Direct supervision/assist for medications management ? ?  ?Equipment Recommendations None recommended by PT  ?Recommendations for Other Services ?    ?  ?Functional Status Assessment Patient has not had a recent decline in their functional status  ? ?  ?Precautions / Restrictions Precautions ?Precautions: Fall ?Restrictions ?Weight Bearing Restrictions: No  ? ?  ? ?Mobility ? Bed Mobility ?Overal bed mobility: Modified Independent ?  ?  ?  ?  ?  ?  ?  ?   ? ?Transfers ?Overall transfer level: Modified independent ?Equipment used: None ?  ?  ?  ?  ?  ?  ?  ?  ?  ? ?Ambulation/Gait ?Ambulation/Gait assistance: Supervision ?Gait Distance (Feet): 150 Feet ?Assistive device: None ?Gait Pattern/deviations: Drifts right/left ?Gait velocity: decreased ?  ?  ?General Gait Details: drifting during ambulation but no overt LOB. Patient states she is at baseline ? ?Stairs ?  ?  ?  ?  ?  ? ?Wheelchair Mobility ?  ? ?Modified Rankin (Stroke Patients Only) ?  ? ?  ? ?Balance Overall balance assessment: Mild deficits observed, not formally tested, History of Falls ?  ?  ?  ?  ?  ?  ?  ?  ?  ?  ?  ?  ?  ?  ?  ?  ?  ?  ?   ? ? ? ?Pertinent Vitals/Pain Pain Assessment ?Pain Assessment: No/denies pain  ? ? ?Home Living Family/patient expects to be discharged to:: Private residence ?Living Arrangements: Children;Other relatives ?Available Help at Discharge: Family ?Type of Home: House ?Home Access: Stairs to enter ?  ?Entrance Stairs-Number of Steps: 1 ?  ?Home Layout: Two level;Able to live on main level with bedroom/bathroom (her bedroom/bathroom is on main level; daughter works from home upstairs) ?Home Equipment: Kasandra Knudsen - single point ?   ?  ?Prior Function Prior Level of Function : Independent/Modified Independent;History of Falls (last six months) ?  ?  ?  ?  ?  ?  ?Mobility Comments: reports at least 3 falls in past week ?  ?  ? ? ?Hand Dominance  ?   ? ?  ?Extremity/Trunk Assessment  ? Upper Extremity Assessment ?Upper  Extremity Assessment: Defer to OT evaluation ?  ? ?Lower Extremity Assessment ?Lower Extremity Assessment: Generalized weakness ?  ? ?Cervical / Trunk Assessment ?Cervical / Trunk Assessment: Kyphotic  ?Communication  ? Communication: No difficulties  ?Cognition Arousal/Alertness: Awake/alert ?Behavior During Therapy: [facility] for tasks assessed/performed ?Overall Cognitive Status: No family/caregiver present to determine baseline cognitive functioning ?  ?  ?  ?  ?  ?  ?   ?  ?  ?  ?  ?  ?  ?  ?  ?  ?General Comments: states "2033" for year and increased time to come up with place. Unsure of baseline cognition due to no family present ?  ?  ? ?  ?General Comments   ? ?  ?Exercises    ? ?Assessment/Plan  ?  ?PT Assessment Patient does not need any further PT services  ?PT Problem List   ? ?   ?  ?PT Treatment Interventions     ? ?PT Goals (Current goals can be found in the Care Plan section)  ?Acute Rehab PT Goals ?Patient Stated Goal: to go home ?PT Goal Formulation: All assessment and education complete, DC therapy ? ?  ?Frequency   ?  ? ? ?Co-evaluation   ?  ?  ?  ?  ? ? ?  ?AM-PAC PT "6 Clicks" Mobility  ?Outcome Measure Help needed turning from your back to your side while in a flat bed without using bedrails?: None ?Help needed moving from lying on your back to sitting on the side of a flat bed without using bedrails?: None ?Help needed moving to and from a bed to a chair (including a wheelchair)?: None ?Help needed standing up from a chair using your arms (e.g., wheelchair or bedside chair)?: None ?Help needed to walk in hospital room?: A Little ?Help needed climbing 3-5 steps with a railing? : A Little ?6 Click Score: 22 ? ?  ?End of Session Equipment Utilized During Treatment: Gait belt ?Activity Tolerance: Patient tolerated treatment well ?Patient left: in chair;with call bell/phone within reach;with chair alarm set ?Nurse Communication: Mobility status ?PT Visit Diagnosis: Unsteadiness on feet (R26.81);Muscle weakness (generalized) (M62.81);History of falling (Z91.81) ?  ? ?Time: 7672-0947 ?PT Time Calculation (min) (ACUTE ONLY): 21 min ? ? ?Charges:   PT Evaluation ?$PT Eval Moderate Complexity: 1 Mod ?  ?  ?   ? ? ?Shareta Fishbaugh A. Gilford Rile, PT, DPT ?Acute Rehabilitation Services ?Pager [phone] ?Office [phone] ? ? ?Analiyah Lechuga A Sueko Dimichele ?12/27/2021, 1:12 PM ? ?

## 2021-12-28 DIAGNOSIS — K838 Other specified diseases of biliary tract: Secondary | ICD-10-CM

## 2021-12-28 DIAGNOSIS — Z9049 Acquired absence of other specified parts of digestive tract: Secondary | ICD-10-CM

## 2021-12-28 LAB — COMPREHENSIVE METABOLIC PANEL
ALT: 51 U/L — ABNORMAL HIGH (ref 0–44)
AST: 30 U/L (ref 15–41)
Albumin: 2.9 g/dL — ABNORMAL LOW (ref 3.5–5.0)
Alkaline Phosphatase: 119 U/L (ref 38–126)
Anion gap: 13 (ref 5–15)
BUN: 20 mg/dL (ref 8–23)
CO2: 19 mmol/L — ABNORMAL LOW (ref 22–32)
Calcium: 9.4 mg/dL (ref 8.9–10.3)
Chloride: 109 mmol/L (ref 98–111)
Creatinine, Ser: 1.43 mg/dL — ABNORMAL HIGH (ref 0.44–1.00)
GFR, Estimated: 37 mL/min — ABNORMAL LOW (ref >60)
Glucose, Bld: 124 mg/dL — ABNORMAL HIGH (ref 70–99)
Potassium: 3.6 mmol/L (ref 3.5–5.1)
Sodium: 141 mmol/L (ref 135–145)
Total Bilirubin: 1.2 mg/dL (ref 0.3–1.2)
Total Protein: 5.5 g/dL — ABNORMAL LOW (ref 6.5–8.1)

## 2021-12-28 LAB — CBC
HCT: 31.7 % — ABNORMAL LOW (ref 36.0–46.0)
Hemoglobin: 10.6 g/dL — ABNORMAL LOW (ref 12.0–15.0)
MCH: 31.1 pg (ref 26.0–34.0)
MCHC: 33.4 g/dL (ref 30.0–36.0)
MCV: 93 fL (ref 80.0–100.0)
Platelets: 227 10*3/uL (ref 150–400)
RBC: 3.41 MIL/uL — ABNORMAL LOW (ref 3.87–5.11)
RDW: 13.1 % (ref 11.5–15.5)
WBC: 9.5 10*3/uL (ref 4.0–10.5)
nRBC: 0 % (ref 0.0–0.2)

## 2021-12-28 LAB — GLUCOSE, CAPILLARY
Glucose-Capillary: 125 mg/dL — ABNORMAL HIGH (ref 70–99)
Glucose-Capillary: 121 mg/dL — ABNORMAL HIGH (ref 70–99)
Glucose-Capillary: 121 mg/dL — ABNORMAL HIGH (ref 70–99)
Glucose-Capillary: 139 mg/dL — ABNORMAL HIGH (ref 70–99)
Glucose-Capillary: 177 mg/dL — ABNORMAL HIGH (ref 70–99)
Glucose-Capillary: 154 mg/dL — ABNORMAL HIGH (ref 70–99)

## 2021-12-28 LAB — CMV IGM: CMV IgM: <30 AU/mL (ref 0.0–29.9)

## 2021-12-28 MED ORDER — CITALOPRAM HYDROBROMIDE 40 MG PO TABS
40.0000 mg | ORAL_TABLET | Freq: Every day | ORAL | Status: DC
  Administered 2021-12-28 – 2021-12-29 (×2): 40 mg via ORAL
  Filled 2021-12-28 (×2): qty 1

## 2021-12-28 MED ORDER — INSULIN ASPART 100 UNIT/ML IJ SOLN
0.0000 [IU] | Freq: Three times a day (TID) | INTRAMUSCULAR | Status: DC
  Administered 2021-12-28 (×2): 2 [IU] via SUBCUTANEOUS
  Administered 2021-12-28 – 2021-12-29 (×3): 1 [IU] via SUBCUTANEOUS

## 2021-12-28 MED ORDER — DILTIAZEM HCL ER COATED BEADS 180 MG PO CP24
180.0000 mg | ORAL_CAPSULE | Freq: Every day | ORAL | Status: DC
  Administered 2021-12-28 – 2021-12-29 (×2): 180 mg via ORAL
  Filled 2021-12-28 (×2): qty 1

## 2021-12-28 MED ORDER — CHOLESTYRAMINE LIGHT 4 G PO PACK
4.0000 g | PACK | Freq: Two times a day (BID) | ORAL | Status: DC
  Administered 2021-12-28 – 2021-12-29 (×3): 4 g via ORAL
  Filled 2021-12-28 (×3): qty 1

## 2021-12-28 NOTE — TOC Progression Note (Signed)
Transition of Care (TOC) - Progression Note  ? ? ?Patient Details  ?Name: Tonya [name] ?MRN: [id number] ?Date of Birth: 03/29/1941 ? ?Transition of Care (TOC) CM/SW Contact  ?Bartholomew Crews, RN ?Phone Number: [phone] ?12/28/2021, 3:18 PM ? ?Clinical Narrative:    ? ?Spoke with patient, [name], and [name]) about post acute transition home. Almyra Free has done some research on memory care facilities, but patient cannot afford $6000 cost.  ? ?Patient's monthly income is $1800 which is about $50 over limit for long term assistance.  ? ?No assistance from [place] d/t patient was divorced from her husband at the time of his death. Almyra Free will be looking into survivorship benefits, but doesn't anticipate that it will bridge the gap.  ? ?Discussed private caregivers, but unsure this is an affordable option.  ? ?Discussed calling Medicare Advantage plan to discuss possible benefits/options.  ? ?Discussed Tax adviser to review potential community resources.  ? ?Advised to call 211 for caregiver resources.  ? ?Referral to [facility] for possible resource advice.  ? ?[place] arranged wit [facility] for RN, SW. Discussed difference between skilled and custodial, and advised that patient was deemed to be at baseline and did not recommend [facility] PT or OT.  ? ?DME request for walker. Order placed and referral sent to [facility] for delivery to the room.  ? ?Almyra Free to provide transportation home which is anticipated tomorrow per conversation that Almyra Free had with MD.  ? ?TOC following for transition needs.  ? ?Expected Discharge Plan: [place] ?Barriers to Discharge: Continued Medical Work up ? ?Expected Discharge Plan and Services ?Expected Discharge Plan: [place] ?  ?Discharge Planning Services: CM Consult ?  ?Living arrangements for the past 2 months: [place] ?                ?  ?  ?  ?  ?  ?HH Arranged: Therapist, sports, Social Work ?[place] Agency: [facility] ?Date HH Agency Contacted: 12/28/21 ?Time [place]: 0930 ?Representative spoke with at [place]: Levada Dy ? ? ?Social Determinants of Health (SDOH) Interventions ?  ? ?Readmission Risk Interventions ?   ? View : No data to display.  ?  ?  ?  ? ? ?

## 2021-12-28 NOTE — TOC Progression Note (Signed)
Transition of Care (TOC) - Progression Note  ? ? ?Patient Details  ?Name: Tonya [name] ?MRN: [id number] ?Date of Birth: 1941/06/07 ? ?Transition of Care (TOC) CM/SW Contact  ?Bartholomew Crews, RN ?Phone Number: [phone] ?12/28/2021, 10:41 AM ? ?Clinical Narrative:    ? ?Notified by CSW of [place] needs. Referral accepted by [facility] for RN, SW with start of care Monday or Tuesday. Patient had evaluations by PT and OT who both recommended no follow up needs. Telephone call to patient's daughter, Tonya [name] - voicemail left with RNCM contact information.  ? ?Expected Discharge Plan: [place] ?Barriers to Discharge: Continued Medical Work up ? ?Expected Discharge Plan and Services ?Expected Discharge Plan: [place] ?  ?Discharge Planning Services: CM Consult ?  ?Living arrangements for the past 2 months: [place] ?                ?  ?  ?  ?  ?  ?HH Arranged: Therapist, sports, Social Work ?[place] Agency: [facility] ?Date HH Agency Contacted: 12/28/21 ?Time [place]: 0930 ?Representative spoke with at [place]: Levada Dy ? ? ?Social Determinants of Health (SDOH) Interventions ?  ? ?Readmission Risk Interventions ?   ? View : No data to display.  ?  ?  ?  ? ? ?

## 2021-12-28 NOTE — Progress Notes (Signed)
?PROGRESS NOTE ? ?Tonya [name] WJX:[id number] DOB: 28-Mar-1941 DOA: 12/25/2021 ?PCP: [name], MD ? ? LOS: 2 days  ? ?Brief Narrative / Interim history: ?Pleasant 81 year old female with history of CAD, HTN, A-fib, DM 2, CKD 3B, prior colon cancer who comes into the hospital with profuse diarrhea over the past week associated with weakness, lightheadedness.  She does not report any vomiting but has been having a poor appetite and was barely eating anything.  No significant abdominal pain that she can tell me.  She was found to have elevated LFTs, and GI was consulted. ? ?Subjective / 24h Interval events: ?Diarrhea is improved, no BMs overnight.  No chest pain, no palpitations, no shortness of breath this morning ? ?Assesement and Plan: ?Principal Problem: ?  Elevated LFTs ?Active Problems: ?  ADENOCARCINOMA, COLON, CECUM ?  Controlled type 2 diabetes mellitus with diabetic nephropathy ([place]) ?  Hyperlipidemia associated with type 2 diabetes mellitus ([place]) ?  GERD ?  Atrial fibrillation ([place]) ?  CKD stage 4 due to type 2 diabetes mellitus ([place]) ?  Ischemic vascular dementia ([place]) ?  Hypotension due to hypovolemia ?  Dehydration ?  Diarrhea ?  Hyponatremia ?  Cardiomegaly ?  Malnutrition of moderate degree ? ? ?Assessment and Plan: ?Principal problem ?Diarrhea-unclear etiology, possible viral. Imaging of the abdomen unremarkable.  CT is negative, GI pathogen panel and C. difficile both negative.  GI consulted, appreciate input.  Seems to be improving, no diarrhea overnight ?  ?Active problems ?Elevated LFTs-mild, improving today.  MRI/MRCP unremarkable.  Hepatitis panel negative ?  ?Hypokalemia, hypomagnesemia-due to diarrheal illness, continue to monitor and replete today as indicated ?  ?Paroxysmal A-fib, with RVR-continue carvedilol, Eliquis, she is normotensive but more tachycardic today with rates up to 130s.  Resume home diltiazem ?  ?Ischemic vascular dementia-mild, good recollection of events and good  insight into her medical issuess.  Intermittently disoriented overnight but back to baseline during daytime ?  ?Hypotension-due to hypovolemia, now resolved.  Back on diltiazem as well as carvedilol for worsening RVR this morning ?  ?Hyperlipidemia-hold statin for now ?  ?Colon adenocarcinoma-stable, monitor as an outpatient ?  ?Cardiomegaly-echo ordered by the admitter, normal EF 55-60%, no WMA.  RV was normal. ?  ?Hyponatremia-due to dehydration, sodium is staying normal today ? ?CKD 3B-baseline creatinine 1.5-1.9, creatinine at baseline of 1.4 this morning ?  ?Normocytic anemia-due to chronic kidney disease, no bleeding, hemoglobin is stable this morning at 10.6 ? ?Thrombocytopenia-likely reactive.  Platelets remain normal ? ?Prediabetes-A1c 6.3 ? ?Scheduled Meds: ? apixaban  2.5 mg Oral BID  ? carvedilol  6.25 mg Oral BID WC  ? cholestyramine light  4 g Oral BID  ? diltiazem  180 mg Oral Daily  ? feeding supplement  237 mL Oral TID BM  ? insulin aspart  0-9 Units Subcutaneous TID AC & HS  ? multivitamin with minerals  1 tablet Oral Daily  ? pantoprazole  40 mg Oral QHS  ? ?Continuous Infusions: ?PRN Meds:.acetaminophen **OR** acetaminophen, HYDROcodone-acetaminophen ? ?Diet Orders (From admission, onward)  ? ?  Start     Ordered  ? 12/26/21 1234  Diet Carb Modified Fluid consistency: Thin; Room service appropriate? Yes  Diet effective now       ?Question Answer Comment  ?Diet-HS Snack? Nothing   ?Calorie Level Medium 1600-2000   ?Fluid consistency: Thin   ?Room service appropriate? Yes   ?  ? 12/26/21 1234  ? ?  ?  ? ?  ? ? ?  DVT prophylaxis: apixaban (ELIQUIS) tablet 2.5 mg Start: 12/27/21 1215 ?SCDs Start: 12/26/21 0126 ?apixaban (ELIQUIS) tablet 2.5 mg  ? ?Lab Results  ?Component Value Date  ? PLT 227 12/28/2021  ? ? ?  Code Status: Full Code ? ?Family Communication: Daughter present at bedside ? ?Status is: Inpatient ? ?Remains inpatient appropriate because: A-fib with RVR ? ?Level of care: Telemetry  Medical ? ?Consultants:  ?GI ? ?Procedures:  ?2D echo ? ?Microbiology  ?none ? ?Antimicrobials: ?none  ? ? ?Objective: ?Vitals:  ? 12/27/21 2122 12/27/21 2122 12/28/21 6734 12/28/21 0827  ?BP: 140/86 140/86 (!) 151/99 126/74  ?Pulse: (!) 105 (!) 105 98 (!) 107  ?Resp: '18 18 18 16  '$ ?Temp: 98.5 ?F (36.9 ?C) 98.5 ?F (36.9 ?C) (!) 97.5 ?F (36.4 ?C) 98.1 ?F (36.7 ?C)  ?TempSrc: Oral  Oral Oral  ?SpO2: 99% 99% 97% 100%  ?Weight:      ?Height:      ? ? ?Intake/Output Summary (Last 24 hours) at 12/28/2021 1004 ?Last data filed at 12/27/2021 1755 ?Gross per 24 hour  ?Intake 565.64 ml  ?Output 0 ml  ?Net 565.64 ml  ? ? ?Wt Readings from Last 3 Encounters:  ?12/25/21 72.3 kg  ?12/25/21 72.3 kg  ?12/23/21 70.3 kg  ? ? ?Examination: ? ?Constitutional: NAD ?Eyes: lids and conjunctivae normal, no scleral icterus ?ENMT: mmm ?Neck: normal, supple ?Respiratory: clear to auscultation bilaterally, no wheezing, no crackles. Normal respiratory effort.  ?Cardiovascular: irregular, no murmurs / rubs / gallops. No LE edema.  Tachycardic ?Abdomen: soft, no distention, no tenderness. Bowel sounds positive.  ?Skin: no rashes ?Neurologic: no focal deficits, equal strength ? ? ?Data Reviewed: I have independently reviewed following labs and imaging studies ? ?CBC ?Recent Labs  ?Lab 12/23/21 ?1150 12/25/21 ?1837 12/26/21 ?1937 12/27/21 ?9024 12/28/21 ?0426  ?WBC 8.3 5.5 5.2 7.6 9.5  ?HGB 10.0* 9.6* 9.2* 10.2* 10.6*  ?HCT 30.1* 28.3* 26.9* 29.4* 31.7*  ?PLT 170 141* 150 185 227  ?MCV 92.9 93.7 92.8 90.7 93.0  ?MCH 30.9 31.8 31.7 31.5 31.1  ?MCHC 33.2 33.9 34.2 34.7 33.4  ?RDW 12.5 12.6 12.8 12.8 13.1  ?LYMPHSABS  --  0.3* 0.4*  --   --   ?MONOABS  --  0.5 0.5  --   --   ?EOSABS  --  0.1 0.2  --   --   ?BASOSABS  --  0.0 0.0  --   --   ? ? ? ?Recent Labs  ?Lab 12/23/21 ?1150 12/25/21 ?1837 12/25/21 ?2032 12/26/21 ?0973 12/27/21 ?5329 12/28/21 ?0426  ?NA 136 132*  133*  --  132* 136 141  ?K 3.5 3.5  3.5  --  3.0* 3.5 3.6  ?CL 104 107  107  --  108  111 109  ?CO2 21* 16*  17*  --  16* 19* 19*  ?GLUCOSE 168* 147*  148*  --  97 111* 124*  ?BUN 29* 33*  33*  --  28* 25* 20  ?CREATININE 1.45* 1.68*  1.67*  --  1.45* 1.38* 1.43*  ?CALCIUM 8.4* 8.0*  8.0*  --  8.1* 8.6* 9.4  ?AST 196* 77*  75*  --  70* 48* 30  ?ALT 111* 81*  80*  --  75* 66* 51*  ?ALKPHOS 64 101  100  --  103 130* 119  ?BILITOT 1.8* 3.2*  3.0*  --  3.0* 1.5* 1.2  ?ALBUMIN 3.6 2.8*  2.8*  --  2.6* 2.7* 2.9*  ?MG  --   --   --  1.2* 2.0  --   ?CRP  --   --   --  2.5*  --   --   ?INR 1.2  --  1.1  --   --   --   ?TSH  --  4.414  --   --   --   --   ?HGBA1C  --   --   --  6.3*  --   --   ?AMMONIA  --  20  --   --   --   --   ?BNP  --  259.0*  --   --   --   --   ? ? ? ?------------------------------------------------------------------------------------------------------------------ ?No results for input(s): CHOL, HDL, LDLCALC, TRIG, CHOLHDL, LDLDIRECT in the last 72 hours. ? ?Lab Results  ?Component Value Date  ? HGBA1C 6.3 (H) 12/26/2021  ? ?------------------------------------------------------------------------------------------------------------------ ?Recent Labs  ?  12/25/21 ?1837  ?TSH 4.414  ? ? ? ?Cardiac Enzymes ?No results for input(s): CKMB, TROPONINI, MYOGLOBIN in the last 168 hours. ? ?Invalid input(s): CK ?------------------------------------------------------------------------------------------------------------------ ?   ?Component Value Date/Time  ? BNP 259.0 (H) 12/25/2021 1837  ? ? ?CBG: ?Recent Labs  ?Lab 12/27/21 ?1629 12/27/21 ?2127 12/28/21 ?0110 12/28/21 ?5852 12/28/21 ?7782  ?GLUCAP 158* 116* 125* 121* 121*  ? ? ? ?Recent Results (from the past 240 hour(s))  ?Respiratory (~20 pathogens) panel by PCR     Status: None  ? Collection Time: 12/26/21 12:35 PM  ? Specimen: Nasopharyngeal Swab; Respiratory  ?Result Value Ref Range Status  ? Adenovirus NOT DETECTED NOT DETECTED Final  ? Coronavirus 229E NOT DETECTED NOT DETECTED Final  ?  Comment: (NOTE) ?The Coronavirus on the  Respiratory Panel, DOES NOT test for the novel  ?Coronavirus (2019 nCoV) ?  ? Coronavirus HKU1 NOT DETECTED NOT DETECTED Final  ? Coronavirus NL63 NOT DETECTED NOT DETECTED Final  ? Coronavirus OC43 NOT DETECTED NOT DETECTED

## 2021-12-28 NOTE — TOC Initial Note (Signed)
Transition of Care (TOC) - Initial/Assessment Note  ? ? ?Patient Details  ?Name: Tonya [name] ?MRN: [id number] ?Date of Birth: Apr 23, 1941 ? ?Transition of Care (TOC) CM/SW Contact:    ?Amador Cunas, LCSW ?Phone Number: ?12/28/2021, 9:26 AM ? ?Clinical Narrative:   SW spoke with pt's dtr Tonya [name] [phone] re dc planning options. Pt from home with her dtr, SIL, and minor grandchildren. Dtr reports she toured a local ALF yesterday but they are unable to afford the out of pocket cost. SW explained SNF and ALF placement processes and answered questions. PT/OT rec pt return home with PRN assistance. Dtr agreeable with plan for home with [facility] and requesting RN/SW along with any additional services available. RNCM to assist with [facility] needs.               ? ? ?Expected Discharge Plan: [place] ?Barriers to Discharge: Continued Medical Work up ? ? ?Patient Goals and CMS Choice ?  ?  ?Choice offered to / list presented to : Adult Children ? ?Expected Discharge Plan and Services ?Expected Discharge Plan: [place] ?  ?Discharge Planning Services: CM Consult ?  ?Living arrangements for the past 2 months: [place] ?                ?  ?  ?  ?  ?  ?  ?  ?  ?  ?  ? ?Prior Living Arrangements/Services ?Living arrangements for the past 2 months: [place] ?Lives with:: Adult Children ?  ?Do you feel safe going back to the place where you live?: Yes      ?Need for Family Participation in Patient Care: Yes (Comment) ?Care giver support system in place?: Yes (comment) ?  ?Criminal Activity/Legal Involvement Pertinent to Current Situation/Hospitalization: No - Comment as needed ? ?Activities of Daily Living ?Home Assistive Devices/Equipment: Cane (specify quad or straight) ?ADL Screening (condition at time of admission) ?Patient's cognitive ability adequate to safely complete daily activities?: No ?Is the patient deaf or have difficulty hearing?: No ?Does the patient have  difficulty seeing, even when wearing glasses/contacts?: No ?Does the patient have difficulty concentrating, remembering, or making decisions?: No ?Patient able to express need for assistance with ADLs?: Yes ?Does the patient have difficulty dressing or bathing?: No ?Independently performs ADLs?: Yes (appropriate for developmental age) ?Does the patient have difficulty walking or climbing stairs?: No ?Weakness of Legs: None ?Weakness of Arms/Hands: None ? ?Permission Sought/Granted ?  ?  ?   ?   ?   ?   ? ?Emotional Assessment ?  ?  ?  ?Orientation: : Oriented to Self, Oriented to Place, Oriented to  Time, Oriented to Situation ?Alcohol / Substance Use: Not Applicable ?Psych Involvement: No (comment) ? ?Admission diagnosis:  Dehydration [E86.0] ?Hyperbilirubinemia [E80.6] ?Jaundice [R17] ?Elevated LFTs [R79.89] ?Diarrhea, unspecified type [R19.7] ?Patient Active Problem List  ? Diagnosis Date Noted  ? Diarrhea 12/26/2021  ? Hyponatremia 12/26/2021  ? Cardiomegaly 12/26/2021  ? Malnutrition of moderate degree 12/26/2021  ? Hypotension due to hypovolemia 12/25/2021  ? Dehydration 12/25/2021  ? Elevated LFTs 12/25/2021  ? Hematoma 12/25/2021  ? Jaundice 12/25/2021  ? Confusion 12/25/2021  ? Saccular aneurysm 09/05/2021  ? Secondary hyperparathyroidism ([place]) 04/11/2021  ? Ischemic vascular dementia ([place]) 04/11/2021  ? Secondary hypoparathyroidism ([place]) 04/08/2021  ? Left leg swelling 03/05/2020  ? Hypertension associated with diabetes ([place]) 01/09/2020  ? Lightheadedness 01/09/2020  ? Plantar fasciitis, left 12/13/2019  ?  Acquired trigger finger of left ring finger 07/11/2019  ? Osteoarthritis of both wrists 07/11/2019  ? Coronary artery disease involving native coronary artery of native heart without angina pectoris 10/24/2018  ? Senile purpura ([place]) 03/01/2018  ? History of total replacement of right shoulder joint 11/06/2016  ? Acute anemia 11/29/2015  ? Spinal stenosis, lumbar region, with neurogenic claudication  11/26/2015  ?  Class: Chronic  ? Mixed incontinence urge and stress 10/29/2015  ? CKD stage 4 due to type 2 diabetes mellitus ([place]) 06/25/2015  ? Counseling regarding end of life decision making 10/26/2014  ? S/P total knee replacement using cement 04/03/2014  ? Atrial fibrillation ([place]) 10/17/2010  ? Allergic rhinitis 01/23/2009  ? ADENOCARCINOMA, COLON, CECUM 10/05/2008  ? Coronary atherosclerosis 11/23/2007  ? ANXIETY 09/19/2007  ? Major depressive disorder, recurrent episode, moderate ([place]) 09/19/2007  ? GERD 09/19/2007  ? Controlled type 2 diabetes mellitus with diabetic nephropathy ([place]) 03/21/2007  ? SYNDROME, CARPAL TUNNEL 03/21/2007  ? Hyperlipidemia associated with type 2 diabetes mellitus ([place]) 03/21/2007  ? IBS 03/21/2007  ? Osteoarthrosis, unspecified whether generalized or localized, unspecified site 03/21/2007  ? ?PCP:  [name], MD ?Pharmacy:   ?[id number]?Phone: [phone]: [phone]? ? ? ? ?Social Determinants of Health (SDOH) Interventions ?  ? ?Readmission Risk Interventions ?   ? View : No data to display.  ?  ?  ?  ? ? ? ?

## 2021-12-28 NOTE — Plan of Care (Signed)
  Problem: Activity: Goal: Risk for activity intolerance will decrease Outcome: Progressing   Problem: Nutrition: Goal: Adequate nutrition will be maintained Outcome: Progressing   Problem: Coping: Goal: Level of anxiety will decrease Outcome: Progressing   

## 2021-12-28 NOTE — Progress Notes (Signed)
? ? Progress Note ? ? Subjective  ?Chief Complaint: Jaundice, anorexia, nausea and malaise. ? ?Patient lying in bed, states she is feeling a little weak today. ?Denies diarrhea overnight. ?Urine denies nausea, vomiting, abdominal pain. ?Patient drinking Ensure. ?Patient states she has not been working with PT OT however I saw them in the room with her yesterday and there are notes.  Patient admits to poor memory.  No focal weakness. ? ? Objective  ? ?Vital signs in last 24 hours: ?Temp:  [97.5 ?F (36.4 ?C)-98.5 ?F (36.9 ?C)] 98.1 ?F (36.7 ?C) (04/02 0827) ?Pulse Rate:  [98-107] 107 (04/02 0827) ?Resp:  [16-18] 16 (04/02 0827) ?BP: (126-151)/(74-99) 126/74 (04/02 0827) ?SpO2:  [97 %-100 %] 100 % (04/02 0827) ?Last BM Date : 12/28/21 ? ?General:   female in no acute distress  ?Heart:  Regular rate and rhythm; no murmurs ?Pulm: Clear anteriorly; no wheezing ?Abdomen:  Soft, Non-distended AB, Active bowel sounds. No tenderness . , No organomegaly appreciated. ?Extremities:  without  edema. ?Neurologic:  Alert and  oriented x4;  No focal deficits.  ?Psych:  Cooperative. Normal mood and affect. ? ? ?Intake/Output from previous day: ?04/01 0701 - 04/02 0700 ?In: 925.6 [P.O.:720; IV Piggyback:205.6] ?Out: 0  ?Intake/Output this shift: ?No intake/output data recorded. ? ?Lab Results: ?Recent Labs  ?  12/26/21 ?0632 12/27/21 ?4098 12/28/21 ?0426  ?WBC 5.2 7.6 9.5  ?HGB 9.2* 10.2* 10.6*  ?HCT 26.9* 29.4* 31.7*  ?PLT 150 185 227  ? ?BMET ?Recent Labs  ?  12/26/21 ?1191 12/27/21 ?4782 12/28/21 ?0426  ?NA 132* 136 141  ?K 3.0* 3.5 3.6  ?CL 108 111 109  ?CO2 16* 19* 19*  ?GLUCOSE 97 111* 124*  ?BUN 28* 25* 20  ?CREATININE 1.45* 1.38* 1.43*  ?CALCIUM 8.1* 8.6* 9.4  ? ?LFT ?Recent Labs  ?  12/25/21 ?1837 12/26/21 ?9562 12/27/21 ?1308 12/28/21 ?0426  ?PROT 5.1*  5.2*   < > 5.2* 5.5*  ?ALBUMIN 2.8*  2.8*   < > 2.7* 2.9*  ?AST 77*  75*   < > 48* 30  ?ALT 81*  80*   < > 66* 51*  ?ALKPHOS 101  100   < > 130* 119  ?BILITOT 3.2*   3.0*   < > 1.5* 1.2  ?BILIDIR 1.8*   < > 0.6*  --   ?IBILI 1.4*  --   --   --   ? < > = values in this interval not displayed.  ? ?PT/INR ?Recent Labs  ?  12/25/21 ?2032  ?LABPROT 14.3  ?INR 1.1  ? ? ?Studies/Results: ?No results found. ? ? ? Impression/Plan:  ? ?Elevated LFTs, jaundice unclear etiology ?Nonobstructive pattern-downtrending LFTs. ?AST 30  ALT 51  ?Alkphos 119 TBili 1.2 ?12/25/2021 INR 1.1  ? ? Alkphos 130 TBili 1.5 ?-alk phos slightly elevated compared to yesterday 130, total bilirubin trending down  ?12/25/2021 INR 1.1 ?Pending CMV. ?Negative EBV ?Negative ESR, CRP mildly elevated 2.5, negative hepatitis panel ? ?There is not evidence of biliary obstruction based on imaging or labs.  ?[place]: CBD 5.3 mm, status post cholecystectomy, normal liver ?CT: Pericardial effusion, multivessel CAD, normal-appearing liver, CBD 1.1 cm, status postcholecystectomy ?MRCP mild extrahepatic biliary ductal dilatation, no choledocholithiasis no obstructing mass ? ?At this time patient's LFTs continue to decrease, still no clear etiology but patient should be able to follow-up outpatient with her office. ?Suggest 1 week LFTs after discharge, and follow-up in our office with Dr. Ardis Hughs myself or PA Amy Trellis Paganini. ? ?  Diarrhea ?Unclear etiology, C. difficile negative, GI pathogen negative. ?Can consider pancreatic elastase ?-If continues or worsens, can consider repeat flex sig versus colonoscopy to evaluate for microscopic colitis, reevaluation of history of adenocarcinoma of the colon ?-This could be done outpatient ? ?History of colon adenocarcinoma ?Last colonoscopy with Dr. Ardis Hughs 2019 normal-appearing right sided ileocolonic anastomosis.  6 total adenomatous polyps removed.  [place] diverticula.  Recall 3 years but due to age and comorbidities, decision left to patient. ? ?Proximal atrial fibrillation ?On Eliquis ? ? ?Future Appointments  ?Date Time Provider [place]  ?01/06/2022  3:45 PM [facility] CCM PHARMACIST  [facility]  ?02/02/2022  8:00 AM [facility] MM 2 GI-BCGMM [facility]  ?02/02/2022  8:30 AM [facility] DX DEXA 1 [facility]-BCGDG [facility]  ?03/17/2022  8:00 AM [facility] LAB [facility]  ?03/24/2022  4:00 PM Diona Browner, Amy E, MD [facility]  ?08/18/2022  9:45 AM [facility] NURSE HEALTH ADVISOR [facility]  ? ? ? ? LOS: 2 days  ? ?Vladimir [place]  12/28/2021, 12:34 PM ? ? ? ?

## 2021-12-29 ENCOUNTER — Telehealth: Payer: Self-pay

## 2021-12-29 DIAGNOSIS — E876 Hypokalemia: Secondary | ICD-10-CM

## 2021-12-29 DIAGNOSIS — R7989 Other specified abnormal findings of blood chemistry: Secondary | ICD-10-CM

## 2021-12-29 DIAGNOSIS — N1832 Chronic kidney disease, stage 3b: Secondary | ICD-10-CM

## 2021-12-29 LAB — BASIC METABOLIC PANEL
Anion gap: 6 (ref 5–15)
BUN: 21 mg/dL (ref 8–23)
CO2: 22 mmol/L (ref 22–32)
Calcium: 8.9 mg/dL (ref 8.9–10.3)
Chloride: 109 mmol/L (ref 98–111)
Creatinine, Ser: 1.27 mg/dL — ABNORMAL HIGH (ref 0.44–1.00)
GFR, Estimated: 42 mL/min — ABNORMAL LOW (ref >60)
Glucose, Bld: 131 mg/dL — ABNORMAL HIGH (ref 70–99)
Potassium: 3.2 mmol/L — ABNORMAL LOW (ref 3.5–5.1)
Sodium: 137 mmol/L (ref 135–145)

## 2021-12-29 LAB — GLUCOSE, CAPILLARY: Glucose-Capillary: 125 mg/dL — ABNORMAL HIGH (ref 70–99)

## 2021-12-29 MED ORDER — POTASSIUM CHLORIDE CRYS ER 20 MEQ PO TBCR
40.0000 meq | EXTENDED_RELEASE_TABLET | Freq: Once | ORAL | Status: AC
  Administered 2021-12-29: 40 meq via ORAL
  Filled 2021-12-29: qty 2

## 2021-12-29 NOTE — Telephone Encounter (Signed)
-----   Message from Irving Copas., MD sent at 12/28/2021  1:13 PM EDT ----- ?Regarding: Follow-up ?Tonya [name], ?The patient will likely be discharged tomorrow from the hospital.  Please set up LFTs in 1 week.  Please set up a follow-up in clinic with one of the APP's or with DJ in 3 to 4 weeks for follow-up of diarrhea and abnormal LFTs and dilated bile duct.  Consideration of role of endoscopic ultrasound was made during this hospitalization but not felt to be needed due to the decline in LFTs rapidly.  But certainly something to think about if they do not normalize.  She also has had some longer standing diarrheal issues that were exacerbated recently but seem to be stable at this point and so to follow that up. ?Thanks. ?GM ? ?FYI DJ ? ? ?

## 2021-12-29 NOTE — Telephone Encounter (Signed)
Milus Banister, MD  Mansouraty, Telford Nab., MD; Timothy Lasso, RN ?Great, thanks.    ? ?[place] have those LFTs sent to me.  ?

## 2021-12-29 NOTE — Discharge Summary (Addendum)
? ?Physician Discharge Summary  ?Tonya [name] ZHG:[id number] DOB: 08-10-1941 DOA: 12/25/2021 ? ?PCP: [name], MD ? ?Admit date: 12/25/2021 ?Discharge date: 12/29/2021 ? ?Admitted From: home ?Disposition:  home ? ?Recommendations for Outpatient Follow-up:  ?Follow up with GI in 1-2 weeks ?Please obtain CMP/CBC in one week ? ?Home Health: RN, SW ?Equipment/Devices: cane ? ?Discharge Condition: stable ?CODE STATUS: Full code ?Diet recommendation: regular ? ?HPI: Per admitting MD, ?Tonya [name] is a 81 y.o. female with medical history significant of  CAD, htn, A.FIB, dm2, ckd, hx of colon cancer Presented with   N/V RUQ pain Persistent nausea/ vomiting and dehydration Sp cholecystectomy ears ago Has been light headed trouble walking  Pt was seen by her PCP that noted she was hypotensive down to 70/40's And sent her to ER  Pt denies feer reports she started to have nausea vomiting diarrhea for the past few daysa nad now is really lightheaded Reports epigastric pain  ? ?Hospital Course / Discharge diagnoses: ?Principal Problem: ?  Elevated LFTs ?Active Problems: ?  ADENOCARCINOMA, COLON, CECUM ?  Controlled type 2 diabetes mellitus with diabetic nephropathy ([place]) ?  Hyperlipidemia associated with type 2 diabetes mellitus ([place]) ?  GERD ?  Atrial fibrillation ([place]) ?  Ischemic vascular dementia ([place]) ?  Hypotension due to hypovolemia ?  Dehydration ?  Diarrhea ?  Hyponatremia ?  Cardiomegaly ?  Malnutrition of moderate degree ?  Stage 3b chronic kidney disease (CKD) ([place]) ?  Hypokalemia ?  Hypomagnesemia ? ? ?Assessment and Plan: ?Principal problem ?Diarrhea-unclear etiology, possible viral. Imaging of the abdomen unremarkable.  CT is negative, GI pathogen panel and C. difficile both negative.  GI consulted, appreciate input.  This was self-limited, she improved and her diarrhea resolved.  Daughter tells me she was recently on nitrofurantoin and that can possibly be the culprit. ?  ?Active problems ?Elevated  LFTs-mild, improving.  MRI/MRCP unremarkable.  Hepatitis panel negative ?Hypokalemia, hypomagnesemia-due to diarrheal illness, repleted.  Her diarrhea resolved now  ?Paroxysmal A-fib, with RVR-continue home medications, Eliquis.  Rate controlled  ?Ischemic vascular dementia-mild, good recollection of events and good insight into her medical issuess.  Intermittently disoriented at night but back to baseline during daytime ?Hypotension-due to hypovolemia, now resolved.  ?Hyperlipidemia-resume home medications ?Colon adenocarcinoma-stable, monitor as an outpatient ?Cardiomegaly-echo ordered by the admitter, normal EF 55-60%, no WMA.  RV was normal. ?Hyponatremia-due to dehydration, sodium normalized ?CKD 3B-baseline creatinine around 1.5-1.9, creatinine 1.2 at the time of discharge ?Normocytic anemia-due to chronic kidney disease, no bleeding, hemoglobin has remained stable ?Thrombocytopenia-likely reactive.  Platelets have normalized ?Prediabetes-A1c 6.3 ? ?Sepsis ruled out ? ?Discharge Instructions ? ? ?Allergies as of 12/29/2021   ? ?   Reactions  ? Amoxicillin-pot Clavulanate Swelling, Other (See Comments)  ? Augmentin per patient, tongue swelling  ? ?  ? ?  ?Medication List  ?  ? ?TAKE these medications   ? ?Accu-Chek Aviva Plus w/Device Kit ?Use to check blood once sugar daily. ?  ?atorvastatin 20 MG tablet ?Commonly known as: LIPITOR ?TAKE 1 TABLET BY MOUTH EVERY DAY ?  ?carvedilol 12.5 MG tablet ?Commonly known as: COREG ?TAKE 1 TABLET (12.$RemoveBefo'5MG'MTPswCEYsQf$  TOTAL) BY MOUTH TWICE A DAY WITH MEALS ?What changed: See the new instructions. ?  ?cetirizine 10 MG tablet ?Commonly known as: ZYRTEC ?Take 10 mg by mouth at bedtime. ?  ?cholestyramine light 4 g packet ?Commonly known as: Prevalite ?USE 1 PACKET TWICE DAILY WITH A MEAL, AT LEAST 2 HOURS AWAY FROM  OTHER MEDS  Patient needs an appointment for further refills. ?What changed:  ?how much to take ?how to take this ?when to take this ?additional instructions ?  ?citalopram 40 MG  tablet ?Commonly known as: CELEXA ?TAKE 1 TABLET BY MOUTH EVERY DAY ?  ?diltiazem 180 MG 24 hr capsule ?Commonly known as: CARDIZEM CD ?TAKE 1 CAPSULE BY MOUTH EVERY DAY ?What changed: how much to take ?  ?Eliquis 2.5 MG Tabs tablet ?Generic drug: apixaban ?TAKE 1 TABLET BY MOUTH TWICE A DAY ?What changed: how much to take ?  ?ferrous sulfate 325 (65 FE) MG EC tablet ?Take 325 mg by mouth 2 (two) times daily. ?  ?glucose blood test strip ?Commonly known as: Accu-Chek Aviva Plus ?Use to check blood sugar daily. ?  ?isosorbide mononitrate 30 MG 24 hr tablet ?Commonly known as: IMDUR ?TAKE 1 TABLET BY MOUTH EVERY DAY ?  ?losartan 100 MG tablet ?Commonly known as: COZAAR ?Take 1 tablet (100 mg total) by mouth daily. ?  ?omeprazole 20 MG capsule ?Commonly known as: PRILOSEC ?TAKE 1 CAPSULE BY MOUTH EVERY DAY ?What changed: how much to take ?  ?ondansetron 4 MG disintegrating tablet ?Commonly known as: ZOFRAN-ODT ?Take 1 tablet (4 mg total) by mouth every 8 (eight) hours as needed. ?What changed: reasons to take this ?  ?Vitamin D (Ergocalciferol) 1.25 MG (50000 UNIT) Caps capsule ?Commonly known as: DRISDOL ?TAKE 1 CAPSULE (50,000 UNITS TOTAL) BY MOUTH EVERY 7 (SEVEN) DAYS ?What changed: when to take this ?  ? ?  ? ?  ?  ? ? ?  ?Durable Medical Equipment  ?(From admission, onward)  ?  ? ? ?  ? ?  Start     Ordered  ? 12/28/21 1412  For home use only DME Walker rolling  Once       ?Question Answer Comment  ?Walker: With 5 Inch Wheels   ?Patient needs a walker to treat with the following condition Mobility impaired   ?  ? 12/28/21 1412  ? ?  ?  ? ?  ? ? Follow-up Information   ? ? [place] [place] Follow up.   ?Specialty: Home Health Services ?Why: agency now goes by name [facility] - the office will call to schedule home health visits ?Contact information: ?[address] ?[address] ?[place] [address] ?[phone] ? ? ?  ?  ? ?  ?  ? ?  ? ? ?Consultations: ?GI ? ?Procedures/Studies: ? ?CT ABDOMEN PELVIS  WO CONTRAST ? ?Result Date: 12/25/2021 ?CLINICAL DATA:  Right upper quadrant abdominal pain, loss of appetite. EXAM: CT ABDOMEN AND PELVIS WITHOUT CONTRAST TECHNIQUE: Multidetector CT imaging of the abdomen and pelvis was performed following the standard protocol without IV contrast. RADIATION DOSE REDUCTION: This exam was performed according to the departmental dose-optimization program which includes automated exposure control, adjustment of the mA and/or kV according to patient size and/or use of iterative reconstruction technique. COMPARISON:  12/23/2021. FINDINGS: Lower chest: The heart is normal in size and there is a trace pericardial effusion. Scattered multi-vessel coronary artery calcifications are noted. There are trace bilateral pleural effusions with atelectasis at the lung bases. Hepatobiliary: No focal liver abnormality is seen. Status post cholecystectomy. There is mild dilatation of the common bile duct measuring 1.1 cm. Fat stranding is noted at the porta hepatis. Pancreas: Unremarkable. No pancreatic ductal dilatation or surrounding inflammatory changes. Spleen: The spleen is normal in size. Multiple calcified granuloma are present. Adrenals/Urinary Tract: The adrenal glands are  within normal limits. No renal calculus or hydronephrosis. No ureteral calculus or obstructive uropathy. Stable perinephric fat stranding is noted bilaterally. The bladder is unremarkable. Stomach/Bowel: The stomach is nondistended. Mild fat stranding is noted at the gastric antrum and proximal duodenum. There is a pocket of air adjacent to the duodenum near the pancreatic head measuring 2.1 cm, likely duodenal diverticulum. No bowel obstruction, free air, or pneumatosis. Scattered diverticula are noted along the colon without evidence of diverticulitis. Right hemicolectomy changes are noted. Vascular/Lymphatic: Curvilinear calcification at the right renal artery suggesting small pseudoaneurysm. Aortic atherosclerosis.  Prominent lymph nodes are present at the porta pedis. Reproductive: Uterus and bilateral adnexa are unremarkable. Other: There is a small fat containing umbilical hernia. No ascites. Musculoskeletal: Stable sk

## 2021-12-29 NOTE — Progress Notes (Signed)
Patient given discharge instructions and stated understanding. 

## 2021-12-29 NOTE — Telephone Encounter (Signed)
I have entered the labs as ordered and follow up with DJ on 01/28/22 at 850 am.  I will mail a letter to the pt home and information will be given at discharge.   ?

## 2021-12-29 NOTE — Care Management Important Message (Signed)
Important Message ? ?Patient Details  ?Name: Tonya [name] ?MRN: [id number] ?Date of Birth: 08-31-1941 ? ? ?Medicare Important Message Given:  Yes ? ?Patient left prior to IM delivery will mail the IM to the patient home address.  ? ? ?[name] ?12/29/2021, 1:46 PM ?

## 2021-12-29 NOTE — Progress Notes (Signed)
DISCHARGE NOTE HOME ?[name] to be discharged Home per MD order. Discussed prescriptions and follow up appointments with the patient. Prescriptions given to patient; medication list explained in detail. Patient verbalized understanding. ? ?Skin clean, dry and intact without evidence of skin break down, no evidence of skin tears noted. IV catheter discontinued intact. Site without signs and symptoms of complications. Dressing and pressure applied. Pt denies pain at the site currently. No complaints noted. ? ?Patient free of lines, drains, and wounds.  ? ?An After Visit Summary (AVS) was printed and given to the patient. ?Patient escorted via wheelchair, and discharged home via private auto. ? ?Vira Agar, RN  ?

## 2021-12-29 NOTE — Progress Notes (Signed)
Chaplain received consult indicating pt has a living will and health care power of attorney. Chaplain introduced spiritual care and offered support during hospitalization. Chaplain asked clarifying questions to discern whether pt had documents and was waiting for notarization or needed information. Ms Ramstad stated that [name] has been handling everything for her.  ? ?Pt's daughter arrived and clarified that pt has the documents completed and a copy is placed in the pt's chart. She has the originals with her.  ? ?Please page as further needs arise. ? ?Donald Prose. Elyn Peers, M.Div. B[facility] ?Chaplain ?Pager [phone] ?Office [phone] ? ?

## 2021-12-29 NOTE — TOC Transition Note (Signed)
Transition of Care (TOC) - CM/SW Discharge Note ? ? ?Patient Details  ?Name: Tonya [name] ?MRN: [id number] ?Date of Birth: 02-10-1941 ? ?Transition of Care (TOC) CM/SW Contact:  ?Tom-Suen, Renea Ee, RN ?Phone Number: ?12/29/2021, 11:14 AM ? ? ?Clinical Narrative:    ? ?Patient is scheduled for discharge today. Home health referral with [facility] and information on AVS. Rolling walker at bedside. Daughter Tonya [name] to transport home at discharge. Denies any other needs. No further TOC needs noted. ? ?Final next level of care: [place] ?Barriers to Discharge: Barriers Resolved ? ? ?Patient Goals and CMS Choice ?Patient states their goals for this hospitalization and ongoing recovery are:: To return home ?CMS Medicare.gov Compare Post Acute Care list provided to:: Patient ?Choice offered to / list presented to : Patient, Adult Children (Daughter, Tonya [name]) ? ?Discharge Placement ?  ?           ?  ?Patient to be transferred to facility by: Daughter, Tonya [name] ?  ?  ? ?Discharge Plan and Services ?  ?Discharge Planning Services: CM Consult ?           ?DME Arranged: Walker rolling ?DME Agency: [employer] ?Date DME Agency Contacted: 12/29/21 ?Time DME Agency Contacted: 1100 ?  ?HH Arranged: Therapist, sports, Social Work ?[place] Agency: [facility] ?Date HH Agency Contacted: 12/28/21 ?Time [place]: 0930 ?Representative spoke with at [place]: Levada Dy ? ?Social Determinants of Health (SDOH) Interventions ?  ? ? ?Readmission Risk Interventions ?   ? View : No data to display.  ?  ?  ?  ? ? ? ? ? ?

## 2021-12-29 NOTE — Plan of Care (Signed)

## 2021-12-30 DIAGNOSIS — D631 Anemia in chronic kidney disease: Secondary | ICD-10-CM | POA: Diagnosis not present

## 2021-12-30 DIAGNOSIS — E1159 Type 2 diabetes mellitus with other circulatory complications: Secondary | ICD-10-CM | POA: Diagnosis not present

## 2021-12-30 DIAGNOSIS — C180 Malignant neoplasm of cecum: Secondary | ICD-10-CM | POA: Diagnosis not present

## 2021-12-30 DIAGNOSIS — E1122 Type 2 diabetes mellitus with diabetic chronic kidney disease: Secondary | ICD-10-CM | POA: Diagnosis not present

## 2021-12-30 DIAGNOSIS — I152 Hypertension secondary to endocrine disorders: Secondary | ICD-10-CM | POA: Diagnosis not present

## 2021-12-30 DIAGNOSIS — N184 Chronic kidney disease, stage 4 (severe): Secondary | ICD-10-CM | POA: Diagnosis not present

## 2021-12-31 DIAGNOSIS — N184 Chronic kidney disease, stage 4 (severe): Secondary | ICD-10-CM | POA: Diagnosis not present

## 2021-12-31 DIAGNOSIS — C180 Malignant neoplasm of cecum: Secondary | ICD-10-CM | POA: Diagnosis not present

## 2021-12-31 DIAGNOSIS — D631 Anemia in chronic kidney disease: Secondary | ICD-10-CM | POA: Diagnosis not present

## 2021-12-31 DIAGNOSIS — E1159 Type 2 diabetes mellitus with other circulatory complications: Secondary | ICD-10-CM | POA: Diagnosis not present

## 2021-12-31 DIAGNOSIS — E1122 Type 2 diabetes mellitus with diabetic chronic kidney disease: Secondary | ICD-10-CM | POA: Diagnosis not present

## 2021-12-31 DIAGNOSIS — I152 Hypertension secondary to endocrine disorders: Secondary | ICD-10-CM | POA: Diagnosis not present

## 2022-01-01 ENCOUNTER — Telehealth: Payer: Self-pay

## 2022-01-01 NOTE — Chronic Care Management (AMB) (Signed)
? ? ?  Chronic Care Management ?Pharmacy Assistant  ? ?Name: SHUNTAVIA YERBY  MRN: [id number] DOB: 1941-05-09 ? ? ? ?Reason for Encounter: Reminder Call ?  ?Conditions to be addressed/monitored: ?Atrial Fibrillation, CAD, HTN, HLD, DMII, and CKD Stage 4 ? ? ? ?Medications: ?Outpatient Encounter Medications as of 01/01/2022  ?Medication Sig  ? atorvastatin (LIPITOR) 20 MG tablet TAKE 1 TABLET BY MOUTH EVERY DAY (Patient taking differently: Take 20 mg by mouth daily.)  ? Blood Glucose Monitoring Suppl (ACCU-CHEK AVIVA PLUS) w/Device KIT Use to check blood once sugar daily. (Patient not taking: Reported on 12/25/2021)  ? carvedilol (COREG) 12.5 MG tablet TAKE 1 TABLET (12.5MG TOTAL) BY MOUTH TWICE A DAY WITH MEALS (Patient taking differently: Take 12.5 mg by mouth 2 (two) times daily with a meal.)  ? cetirizine (ZYRTEC) 10 MG tablet Take 10 mg by mouth at bedtime.  ? cholestyramine light (PREVALITE) 4 g packet USE 1 PACKET TWICE DAILY WITH A MEAL, AT LEAST 2 HOURS AWAY FROM OTHER MEDS  Patient needs an appointment for further refills. (Patient taking differently: Take 4 g by mouth 2 (two) times daily with a meal.)  ? citalopram (CELEXA) 40 MG tablet TAKE 1 TABLET BY MOUTH EVERY DAY (Patient taking differently: Take 40 mg by mouth daily.)  ? diltiazem (CARDIZEM CD) 180 MG 24 hr capsule TAKE 1 CAPSULE BY MOUTH EVERY DAY (Patient taking differently: Take 180 mg by mouth daily.)  ? ELIQUIS 2.5 MG TABS tablet TAKE 1 TABLET BY MOUTH TWICE A DAY (Patient taking differently: Take 2.5 mg by mouth 2 (two) times daily.)  ? ferrous sulfate 325 (65 FE) MG EC tablet Take 325 mg by mouth 2 (two) times daily.  ? glucose blood (ACCU-CHEK AVIVA PLUS) test strip Use to check blood sugar daily. (Patient not taking: Reported on 12/25/2021)  ? isosorbide mononitrate (IMDUR) 30 MG 24 hr tablet TAKE 1 TABLET BY MOUTH EVERY DAY (Patient taking differently: Take 30 mg by mouth daily.)  ? losartan (COZAAR) 100 MG tablet Take 1 tablet (100 mg total) by  mouth daily.  ? omeprazole (PRILOSEC) 20 MG capsule TAKE 1 CAPSULE BY MOUTH EVERY DAY (Patient taking differently: Take 20 mg by mouth daily.)  ? ondansetron (ZOFRAN-ODT) 4 MG disintegrating tablet Take 1 tablet (4 mg total) by mouth every 8 (eight) hours as needed. (Patient taking differently: Take 4 mg by mouth every 8 (eight) hours as needed for nausea or vomiting.)  ? Vitamin D, Ergocalciferol, (DRISDOL) 1.25 MG (50000 UNIT) CAPS capsule TAKE 1 CAPSULE (50,000 UNITS TOTAL) BY MOUTH EVERY 7 (SEVEN) DAYS (Patient taking differently: Take 50,000 Units by mouth every Sunday.)  ? ?No facility-administered encounter medications on file as of 01/01/2022.  ? ? ?LAYANN BLUETT was contacted to remind of upcoming telephone visit with Charlene Brooke on 01/06/22 at 3:45pm. Patient was reminded to have any blood glucose and blood pressure readings available for review at appointment.  ? ?Patient confirmed appointment. Daughter Almyra Free confirmed ? ? ?Are you having any problems with your medications? No  ? ?Do you have any concerns you like to discuss with the pharmacist? No Daughter Almyra Free reports the patient has improved since discharged from hospital  ? ? ? ?Star Rating Drugs: ?Medication:  Last Fill: Day Supply ?Losartan 148m 10/13/21 90 ?Atorvastatin 261m2/12/23 53 ? ?LiCharlene BrookeCPP notified ? ?Isaiha Asare, CCMA ?Health concierge  ?[phone]?

## 2022-01-05 ENCOUNTER — Telehealth: Payer: Self-pay

## 2022-01-05 NOTE — Telephone Encounter (Signed)
Alyse Low from [facility]  ?[phone] ok to leave v/m  ? ?Verbal orders for  ?Nursing once a week for 5 weeks.  ?

## 2022-01-05 NOTE — Telephone Encounter (Signed)
Return call.  This is not the phone number for Christy at [facility].   ?

## 2022-01-05 NOTE — Telephone Encounter (Signed)
Verbal orders given to [facility] via telephone for Nursing once a week for 5 weeks.  ?

## 2022-01-05 NOTE — Telephone Encounter (Signed)
Sorry should have been  ?[phone] ?

## 2022-01-06 ENCOUNTER — Ambulatory Visit (INDEPENDENT_AMBULATORY_CARE_PROVIDER_SITE_OTHER): Payer: Medicare Other | Admitting: Family Medicine

## 2022-01-06 ENCOUNTER — Telehealth: Payer: Medicare Other

## 2022-01-06 ENCOUNTER — Telehealth: Payer: Self-pay

## 2022-01-06 VITALS — BP 128/72 | HR 125 | Temp 97.1°F | Resp 12 | Ht 65.0 in | Wt 157.1 lb

## 2022-01-06 DIAGNOSIS — D696 Thrombocytopenia, unspecified: Secondary | ICD-10-CM | POA: Diagnosis not present

## 2022-01-06 DIAGNOSIS — T148XXA Other injury of unspecified body region, initial encounter: Secondary | ICD-10-CM | POA: Diagnosis not present

## 2022-01-06 DIAGNOSIS — E1159 Type 2 diabetes mellitus with other circulatory complications: Secondary | ICD-10-CM

## 2022-01-06 DIAGNOSIS — I152 Hypertension secondary to endocrine disorders: Secondary | ICD-10-CM | POA: Diagnosis not present

## 2022-01-06 DIAGNOSIS — F03A0 Unspecified dementia, mild, without behavioral disturbance, psychotic disturbance, mood disturbance, and anxiety: Secondary | ICD-10-CM | POA: Insufficient documentation

## 2022-01-06 DIAGNOSIS — F03A18 Unspecified dementia, mild, with other behavioral disturbance: Secondary | ICD-10-CM

## 2022-01-06 DIAGNOSIS — E1121 Type 2 diabetes mellitus with diabetic nephropathy: Secondary | ICD-10-CM | POA: Diagnosis not present

## 2022-01-06 DIAGNOSIS — R7989 Other specified abnormal findings of blood chemistry: Secondary | ICD-10-CM | POA: Diagnosis not present

## 2022-01-06 DIAGNOSIS — F03B0 Unspecified dementia, moderate, without behavioral disturbance, psychotic disturbance, mood disturbance, and anxiety: Secondary | ICD-10-CM | POA: Insufficient documentation

## 2022-01-06 DIAGNOSIS — E860 Dehydration: Secondary | ICD-10-CM | POA: Diagnosis not present

## 2022-01-06 LAB — CBC WITH DIFFERENTIAL/PLATELET
Basophils Absolute: 0.1 10*3/uL (ref 0.0–0.1)
Basophils Relative: 1.4 % (ref 0.0–3.0)
Eosinophils Absolute: 0.2 10*3/uL (ref 0.0–0.7)
Eosinophils Relative: 1.9 % (ref 0.0–5.0)
HCT: 39.2 % (ref 36.0–46.0)
Hemoglobin: 12.9 g/dL (ref 12.0–15.0)
Lymphocytes Relative: 15.7 % (ref 12.0–46.0)
Lymphs Abs: 1.5 10*3/uL (ref 0.7–4.0)
MCHC: 33 g/dL (ref 30.0–36.0)
MCV: 95.5 fl (ref 78.0–100.0)
Monocytes Absolute: 0.8 10*3/uL (ref 0.1–1.0)
Monocytes Relative: 8.1 % (ref 3.0–12.0)
Neutro Abs: 7.1 10*3/uL (ref 1.4–7.7)
Neutrophils Relative %: 72.9 % (ref 43.0–77.0)
Platelets: 254 10*3/uL (ref 150.0–400.0)
RBC: 4.1 Mil/uL (ref 3.87–5.11)
RDW: 14.8 % (ref 11.5–15.5)
WBC: 9.7 10*3/uL (ref 4.0–10.5)

## 2022-01-06 LAB — COMPREHENSIVE METABOLIC PANEL
ALT: 18 U/L (ref 0–35)
AST: 21 U/L (ref 0–37)
Albumin: 4.3 g/dL (ref 3.5–5.2)
Alkaline Phosphatase: 83 U/L (ref 39–117)
BUN: 28 mg/dL — ABNORMAL HIGH (ref 6–23)
CO2: 28 mEq/L (ref 19–32)
Calcium: 9.7 mg/dL (ref 8.4–10.5)
Chloride: 103 mEq/L (ref 96–112)
Creatinine, Ser: 1.39 mg/dL — ABNORMAL HIGH (ref 0.40–1.20)
GFR: 35.52 mL/min — ABNORMAL LOW (ref >60.00)
Glucose, Bld: 113 mg/dL — ABNORMAL HIGH (ref 70–99)
Potassium: 4.5 mEq/L (ref 3.5–5.1)
Sodium: 141 mEq/L (ref 135–145)
Total Bilirubin: 1.1 mg/dL (ref 0.2–1.2)
Total Protein: 7 g/dL (ref 6.0–8.3)

## 2022-01-06 NOTE — Telephone Encounter (Signed)
Okay to move OT to next week as requested.  Please let them know. ?

## 2022-01-06 NOTE — Telephone Encounter (Signed)
Spoke with Vicente Males at [facility] [phone]) and advised okay to start OT next week per Dr. Diona Browner.  ?

## 2022-01-06 NOTE — Telephone Encounter (Signed)
Tonya [name] from [facility]  ?[phone] no v/m will send you to person can talk to leann, Tonya [name] or Tonya [name]  ? ?Called to get approval to move ot to next week. She does not have anyone that can go out this week. She has been seen by nurse and social worker this week.  ?

## 2022-01-06 NOTE — Telephone Encounter (Signed)
Returned call  to [employer] at phone number provided.  No answer.  ?

## 2022-01-06 NOTE — Progress Notes (Signed)
? ? Patient ID: Tonya [name], female    DOB: 1940-10-24, 81 y.o.   MRN: [id number] ? ?This visit was conducted in person. ? ?BP 128/72   Pulse (!) 125   Temp (!) 97.1 ?F (36.2 ?C)   Resp 12   Ht _0  (1.651 m)   Wt 157 lb 2 oz (71.3 kg)   SpO2 97%   BMI 26.15 kg/m?   ? ?CC:  ?Chief Complaint  ?Patient presents with  ? Hospitalization Follow-up  ? ? ?Subjective:  ? ?HPI: ?Tonya [name] is a 81 y.o. female presenting on 01/06/2022 for Hospitalization Follow-up ? ?Admitted from the office on 3/30.23 for  hypotension 70/40s, nausea/vomiting /diarrhea, epigastric pain, jaundice and dehydration ? ?Hospital summary reviewed and copied for informational purposes: ? ?Diarrhea-unclear etiology, possible viral. Imaging of the abdomen unremarkable.  CT is negative, GI pathogen panel and C. difficile both negative.  GI consulted, appreciate input.  This was self-limited, she improved and her diarrhea resolved.  Daughter tells me she was recently on nitrofurantoin ( had started 1 week before feeling bad) and that can possibly be the culprit. ?  ?Active problems ?Elevated LFTs-mild, improving.  MRI/MRCP unremarkable.  Hepatitis panel negative ?Hypokalemia, hypomagnesemia-due to diarrheal illness, repleted.  Her diarrhea resolved now  ?Paroxysmal A-fib, with RVR-continue home medications, Eliquis.  Rate controlled  ?Ischemic vascular dementia-mild, good recollection of events and good insight into her medical issuess.  Intermittently disoriented at night but back to baseline during daytime ?Hypotension-due to hypovolemia, now resolved.  ?Hyperlipidemia-resume home medications ?Colon adenocarcinoma-stable, monitor as an outpatient ?Cardiomegaly-echo ordered by the admitter, normal EF 55-60%, no WMA.  RV was normal. ?Hyponatremia-due to dehydration, sodium normalized ?CKD 3B-baseline creatinine around 1.5-1.9, creatinine 1.2 at the time of discharge ?Normocytic anemia-due to chronic kidney disease, no bleeding, hemoglobin  has remained stable ?Thrombocytopenia-likely reactive.  Platelets have normalized ?Prediabetes-A1c 6.3 ? Sepsis ruled out ? ?Today she presents with her daughter. ? She reports no N/V/Diarrhea. No pain ? Daughter reports she is eating eating well and drinking fairly well. Needs encouragement. ? She is back to her normal state for the most part but her memory has declined some over all. She is forgetting things such as hospitalization, taking meds etc. ? ? Hematoma on right hip improving, scabbed over. Knot remains painful but is shrinking overall. ?BP Readings from Last 3 Encounters:  ?01/06/22 128/72  ?12/29/21 128/82  ?12/25/21 (!) 74/54  ? ? ? ?   ? ?Relevant past medical, surgical, family and social history reviewed and updated as indicated. Interim medical history since our last visit reviewed. ?Allergies and medications reviewed and updated. ?Outpatient Medications Prior to Visit  ?Medication Sig Dispense Refill  ? atorvastatin (LIPITOR) 20 MG tablet TAKE 1 TABLET BY MOUTH EVERY DAY (Patient taking differently: Take 20 mg by mouth daily.) 90 tablet 1  ? Blood Glucose Monitoring Suppl (ACCU-CHEK AVIVA PLUS) w/Device KIT Use to check blood once sugar daily. 1 kit 0  ? Calcium Carbonate-Vit D-Min (CALCIUM 1200 PO) Take by mouth. 1 daily    ? carvedilol (COREG) 12.5 MG tablet TAKE 1 TABLET (12.5MG TOTAL) BY MOUTH TWICE A DAY WITH MEALS (Patient taking differently: Take 12.5 mg by mouth 2 (two) times daily with a meal.) 180 tablet 3  ? cetirizine (ZYRTEC) 10 MG tablet Take 10 mg by mouth at bedtime.    ? cholestyramine light (PREVALITE) 4 g packet USE 1 PACKET TWICE DAILY WITH A MEAL, AT LEAST 2 HOURS AWAY FROM  OTHER MEDS  Patient needs an appointment for further refills. (Patient taking differently: Take 4 g by mouth 2 (two) times daily with a meal.) 540 packet 0  ? citalopram (CELEXA) 40 MG tablet TAKE 1 TABLET BY MOUTH EVERY DAY (Patient taking differently: Take 40 mg by mouth daily.) 90 tablet 1  ? diltiazem  (CARDIZEM CD) 180 MG 24 hr capsule TAKE 1 CAPSULE BY MOUTH EVERY DAY (Patient taking differently: Take 180 mg by mouth daily.) 90 capsule 3  ? ELIQUIS 2.5 MG TABS tablet TAKE 1 TABLET BY MOUTH TWICE A DAY (Patient taking differently: Take 2.5 mg by mouth 2 (two) times daily.) 60 tablet 11  ? ferrous sulfate 325 (65 FE) MG EC tablet Take 325 mg by mouth 2 (two) times daily.    ? glucose blood (ACCU-CHEK AVIVA PLUS) test strip Use to check blood sugar daily. 100 each 3  ? isosorbide mononitrate (IMDUR) 30 MG 24 hr tablet TAKE 1 TABLET BY MOUTH EVERY DAY (Patient taking differently: Take 30 mg by mouth daily.) 90 tablet 2  ? losartan (COZAAR) 100 MG tablet Take 1 tablet (100 mg total) by mouth daily. 90 tablet 3  ? Magnesium Gluconate (MAGNESIUM 27 PO) Take by mouth. 1 daily    ? omeprazole (PRILOSEC) 20 MG capsule TAKE 1 CAPSULE BY MOUTH EVERY DAY (Patient taking differently: Take 20 mg by mouth daily.) 90 capsule 3  ? ondansetron (ZOFRAN-ODT) 4 MG disintegrating tablet Take 1 tablet (4 mg total) by mouth every 8 (eight) hours as needed. (Patient taking differently: Take 4 mg by mouth every 8 (eight) hours as needed for nausea or vomiting.) 20 tablet 0  ? Vitamin D, Ergocalciferol, (DRISDOL) 1.25 MG (50000 UNIT) CAPS capsule TAKE 1 CAPSULE (50,000 UNITS TOTAL) BY MOUTH EVERY 7 (SEVEN) DAYS (Patient taking differently: Take 50,000 Units by mouth every Sunday.) 12 capsule 0  ? ?No facility-administered medications prior to visit.  ?  ? ?Per HPI unless specifically indicated in ROS section below ?Review of Systems  ?Constitutional:  Positive for fatigue. Negative for fever.  ?HENT:  Negative for congestion.   ?Eyes:  Negative for pain.  ?Respiratory:  Negative for cough and shortness of breath.   ?Cardiovascular:  Negative for chest pain, palpitations and leg swelling.  ?Gastrointestinal:  Negative for abdominal pain.  ?Genitourinary:  Negative for dysuria and vaginal bleeding.  ?Musculoskeletal:  Negative for back pain.   ?Neurological:  Negative for syncope, light-headedness and headaches.  ?Psychiatric/Behavioral:  Negative for dysphoric mood.   ? ?Objective:  ?BP 128/72   Pulse (!) 125   Temp (!) 97.1 ?F (36.2 ?C)   Resp 12   Ht _0  (1.651 m)   Wt 157 lb 2 oz (71.3 kg)   SpO2 97%   BMI 26.15 kg/m?   ?Wt Readings from Last 3 Encounters:  ?01/06/22 157 lb 2 oz (71.3 kg)  ?12/25/21 159 lb 5.9 oz (72.3 kg)  ?12/25/21 159 lb 6 oz (72.3 kg)  ?  ?  ?Physical Exam ?Constitutional:   ?   General: She is not in acute distress. ?   Appearance: Normal appearance. She is well-developed. She is not ill-appearing or toxic-appearing.  ?HENT:  ?   Head: Normocephalic.  ?   Right Ear: Hearing, tympanic membrane, ear canal and external ear normal. Tympanic membrane is not erythematous, retracted or bulging.  ?   Left Ear: Hearing, tympanic membrane, ear canal and external ear normal. Tympanic membrane is not erythematous, retracted or bulging.  ?  Nose: No mucosal edema or rhinorrhea.  ?   Right Sinus: No maxillary sinus tenderness or frontal sinus tenderness.  ?   Left Sinus: No maxillary sinus tenderness or frontal sinus tenderness.  ?   Mouth/Throat:  ?   Pharynx: Uvula midline.  ?Eyes:  ?   General: Lids are normal. Lids are everted, no foreign bodies appreciated.  ?   Conjunctiva/sclera: Conjunctivae normal.  ?   Pupils: Pupils are equal, round, and reactive to light.  ?Neck:  ?   Thyroid: No thyroid mass or thyromegaly.  ?   Vascular: No carotid bruit.  ?   Trachea: Trachea normal.  ?Cardiovascular:  ?   Rate and Rhythm: Normal rate and regular rhythm.  ?   Pulses: Normal pulses.  ?   Heart sounds: Normal heart sounds, S1 normal and S2 normal. No murmur heard. ?  No friction rub. No gallop.  ?Pulmonary:  ?   Effort: Pulmonary effort is normal. No tachypnea or respiratory distress.  ?   Breath sounds: Normal breath sounds. No decreased breath sounds, wheezing, rhonchi or rales.  ?Abdominal:  ?   General: Bowel sounds are normal.  ?    Palpations: Abdomen is soft.  ?   Tenderness: There is no abdominal tenderness.  ?Musculoskeletal:  ?   Cervical back: Normal range of motion and neck supple.  ?Skin: ?   General: Skin is warm and dry.  ?   Finding

## 2022-01-06 NOTE — Progress Notes (Deleted)
Chronic Care Management Pharmacy Note  01/06/2022 Name:  Tonya [name] MRN:  [id number] DOB:  [birth year]  Summary: ***  Recommendations/Changes made from today's visit: ***  Plan: ***   Subjective: Tonya [name] is an 81 y.o. year old female who is a primary patient of Bedsole, Amy E, MD.  The CCM team was consulted for assistance with disease management and care coordination needs.    Engaged with patient by telephone for follow up visit in response to provider referral for pharmacy case management and/or care coordination services.   Consent to Services:  The patient was given information about Chronic Care Management services, agreed to services, and gave verbal consent prior to initiation of services.  Please see initial visit note for detailed documentation.   Patient Care Team: Excell Seltzer, MD as PCP - General Rollene Rotunda, MD as PCP - Cardiology (Cardiology) Phil Dopp, [facility] as Pharmacist (Pharmacist)  Recent office visits: 12/25/21 Dr Ermalene Searing OV: chronic f/u; BP 75/54, sent to ED.  10/17/21 Dr Ermalene Searing OV: f/u depression, B12 deficiency. Given B12 injection. Improved on Celexa. No memory improvement.  Recent consult visits: 12/11/20 Dr Thedore Mins (Nephrology): f/u CKD. No med changes.  12/10/21 Dr August Saucer (Ortho surg): f/u R shoulder replacement. No changes.  11/27/21 PA Juanda Crumble (Cardiology): f/u CAD. Consider d/c Imdur in future for hypotension, continue for now. F/u 1 year.  10/14/21 Dr Christella Hartigan (GI): hx colon cancer. Pt/family agreed to forego colonoscopy unless she has symptoms.  Hospital visits: Medication Reconciliation was completed by comparing discharge summary, patient's EMR and Pharmacy list, and upon discussion with patient.  Admitted to the hospital on 12/25/21 due to Elevated LFTs. Discharge date was 12/29/21. Discharged from [facility]   -Diarrhea, unclear etiology. Self-limited.  Medications that remain the same after Hospital  Discharge:??  -All other medications will remain the same.     Objective:  Lab Results  Component Value Date   CREATININE 1.39 (H) 01/06/2022   BUN 28 (H) 01/06/2022   GFR 35.52 (L) 01/06/2022   GFRNONAA 42 (L) 12/29/2021   GFRAA 30 (L) 11/15/2017   NA 141 01/06/2022   K 4.5 01/06/2022   CALCIUM 9.7 01/06/2022   CO2 28 01/06/2022   GLUCOSE 113 (H) 01/06/2022    Lab Results  Component Value Date/Time   HGBA1C 6.3 (H) 12/26/2021 06:32 AM   HGBA1C 6.6 (H) 09/05/2021 10:33 AM   GFR 35.52 (L) 01/06/2022 09:13 AM   GFR 31.00 (L) 09/05/2021 10:33 AM   MICROALBUR 2.8 (H) 02/19/2011 10:44 AM   MICROALBUR 4.2 (H) 07/09/2010 08:26 AM    Last diabetic Eye exam:  Lab Results  Component Value Date/Time   HMDIABEYEEXA No Retinopathy 09/12/2020 12:00 AM    Last diabetic Foot exam:  Lab Results  Component Value Date/Time   HMDIABFOOTEX done 01/09/2021 12:00 AM     Lab Results  Component Value Date   CHOL 110 09/05/2021   HDL 42.30 09/05/2021   LDLCALC 35 09/05/2021   LDLDIRECT 50.0 10/22/2014   TRIG 165.0 (H) 09/05/2021   CHOLHDL 3 09/05/2021       Latest Ref Rng & Units 01/06/2022    9:13 AM 12/28/2021    4:26 AM 12/27/2021    3:35 AM  Hepatic Function  Total Protein 6.0 - 8.3 g/dL 7.0   5.5   5.2    Albumin 3.5 - 5.2 g/dL 4.3   2.9   2.7    AST 0 - 37 U/L 21  30   48    ALT 0 - 35 U/L 18   51   66    Alk Phosphatase 39 - 117 U/L 83   119   130    Total Bilirubin 0.2 - 1.2 mg/dL 1.1   1.2   1.5    Bilirubin, Direct 0.0 - 0.2 mg/dL   0.6      Lab Results  Component Value Date/Time   TSH 4.414 12/25/2021 06:37 PM   TSH 2.80 03/28/2021 10:18 AM   TSH 3.810 10/24/2018 12:09 PM   FREET4 0.98 04/25/2013 09:48 AM       Latest Ref Rng & Units 01/06/2022    9:13 AM 12/28/2021    4:26 AM 12/27/2021    3:35 AM  CBC  WBC 4.0 - 10.5 K/uL 9.7   9.5   7.6    Hemoglobin 12.0 - 15.0 g/dL 78.2   95.6   21.3    Hematocrit 36.0 - 46.0 % 39.2   31.7   29.4    Platelets 150.0 -  400.0 K/uL 254.0   227   185      Lab Results  Component Value Date/Time   VD25OH 31.15 03/28/2021 10:18 AM   VD25OH 31.41 07/02/2020 08:02 AM    Clinical ASCVD: {YES/NO:21197} The ASCVD Risk score (Arnett DK, et al., 2019) failed to calculate for the following reasons:   The 2019 ASCVD risk score is only valid for ages 10 to 7       10/17/2021   10:36 AM 09/05/2021   10:50 AM 08/14/2021    2:56 PM  Depression screen PHQ 2/9  Decreased Interest 0 3 0  Down, Depressed, Hopeless 0 1 0  PHQ - 2 Score 0 4 0  Altered sleeping 0 0   Tired, decreased energy 0 3   Change in appetite 0 2   Feeling bad or failure about yourself  0 3   Trouble concentrating 0 0   Moving slowly or fidgety/restless 0 0   Suicidal thoughts 0 1   PHQ-9 Score 0 13   Difficult doing work/chores Not difficult at all Somewhat difficult      CHA2DS2/VAS Stroke Risk Points  Current as of 12 minutes ago     6 >= 2 Points: High Risk  1 - 1.99 Points: Medium Risk  0 Points: Low Risk    Last Change: N/A      Details    This score determines the patient's risk of having a stroke if the  patient has atrial fibrillation.       Points Metrics  0 Has Congestive Heart Failure:  No    Current as of 12 minutes ago  1 Has Vascular Disease:  Yes    Current as of 12 minutes ago  1 Has Hypertension:  Yes    Current as of 12 minutes ago  2 Age:  52    Current as of 12 minutes ago  1 Has Diabetes:  Yes    Current as of 12 minutes ago  0 Had Stroke:  No  Had TIA:  No  Had Thromboembolism:  No    Current as of 12 minutes ago  1 Female:  Yes    Current as of 12 minutes ago     Social History   Tobacco Use  Smoking Status Never  Smokeless Tobacco Never   BP Readings from Last 3 Encounters:  01/06/22 128/72  12/29/21 128/82  12/25/21 (!) 74/54  Pulse Readings from Last 3 Encounters:  01/06/22 (!) 125  12/29/21 (!) 104  12/25/21 86   Wt Readings from Last 3 Encounters:  01/06/22 157 lb 2 oz (71.3 kg)   12/25/21 159 lb 5.9 oz (72.3 kg)  12/25/21 159 lb 6 oz (72.3 kg)   BMI Readings from Last 3 Encounters:  01/06/22 26.15 kg/m  12/25/21 26.52 kg/m  12/25/21 26.52 kg/m    Assessment/Interventions: Review of patient past medical history, allergies, medications, health status, including review of consultants reports, laboratory and other test data, was performed as part of comprehensive evaluation and provision of chronic care management services.   SDOH:  (Social Determinants of Health) assessments and interventions performed: {yes/no:20286}  SDOH Screenings   Alcohol Screen: Low Risk    Last Alcohol Screening Score (AUDIT): 0  Depression (PHQ2-9): Low Risk    PHQ-2 Score: 0  Financial Resource Strain: Low Risk    Difficulty of Paying Living Expenses: Not hard at all  Food Insecurity: No Food Insecurity   Worried About Programme researcher, broadcasting/film/video in the Last Year: Never true   Ran Out of Food in the Last Year: Never true  Housing: Low Risk    Last Housing Risk Score: 0  Physical Activity: Inactive   Days of Exercise per Week: 0 days   Minutes of Exercise per Session: 0 min  Social Connections: Moderately Isolated   Frequency of Communication with Friends and Family: Once a week   Frequency of Social Gatherings with Friends and Family: Twice a week   Attends Religious Services: More than 4 times per year   Active Member of [employer] or Organizations: No   Attends Banker Meetings: Never   Marital Status: Widowed  Stress: No Stress Concern Present   Feeling of Stress : Not at all  Tobacco Use: Low Risk    Smoking Tobacco Use: Never   Smokeless Tobacco Use: Never   Passive Exposure: Not on file  Transportation Needs: No Transportation Needs   Lack of Transportation (Medical): No   Lack of Transportation (Non-Medical): No    CCM Care Plan  Allergies  Allergen Reactions   Amoxicillin-Pot Clavulanate Swelling and Other (See Comments)    Augmentin per patient, tongue  swelling   Nitrofurantoin Diarrhea    Nausea, jaundice, liver enzymes abnormal.    Medications Reviewed Today     Reviewed by Consuella Lose, CMA (Certified Medical Assistant) on 01/06/22 at 0813  Med List Status: <None>   Medication Order Taking? Sig Documenting Provider Last Dose Status Informant  atorvastatin (LIPITOR) 20 MG tablet [id number] Yes TAKE 1 TABLET BY MOUTH EVERY DAY  Patient taking differently: Take 20 mg by mouth daily.   Rollene Rotunda, MD Taking Active Child  Blood Glucose Monitoring Suppl (ACCU-CHEK AVIVA PLUS) w/Device KIT [id number] Yes Use to check blood once sugar daily. Excell Seltzer, MD Taking Active Child  Calcium Carbonate-Vit D-Min (CALCIUM 1200 PO) [id number] Yes Take by mouth. 1 daily [provider] Taking Active   carvedilol (COREG) 12.5 MG tablet [id number] Yes TAKE 1 TABLET (12.5MG  TOTAL) BY MOUTH TWICE A DAY WITH MEALS  Patient taking differently: Take 12.5 mg by mouth 2 (two) times daily with a meal.   Bedsole, Amy E, MD Taking Active Child  cetirizine (ZYRTEC) 10 MG tablet [id number] Yes Take 10 mg by mouth at bedtime. [provider] Taking Active Child  cholestyramine light (PREVALITE) 4 g packet [id number] Yes USE 1 PACKET TWICE DAILY WITH A MEAL,  AT LEAST 2 HOURS AWAY FROM OTHER MEDS  Patient needs an appointment for further refills.  Patient taking differently: Take 4 g by mouth 2 (two) times daily with a meal.   Rachael Fee, MD Taking Active Child  citalopram (CELEXA) 40 MG tablet [id number] Yes TAKE 1 TABLET BY MOUTH EVERY DAY  Patient taking differently: Take 40 mg by mouth daily.   Excell Seltzer, MD Taking Active Child  diltiazem (CARDIZEM CD) 180 MG 24 hr capsule [id number] Yes TAKE 1 CAPSULE BY MOUTH EVERY DAY  Patient taking differently: Take 180 mg by mouth daily.   Excell Seltzer, MD Taking Active Child  ELIQUIS 2.5 MG TABS tablet [id number] Yes TAKE 1 TABLET BY MOUTH TWICE A DAY  Patient taking differently: Take  2.5 mg by mouth 2 (two) times daily.   Excell Seltzer, MD Taking Active Child  ferrous sulfate 325 (65 FE) MG EC tablet [id number] Yes Take 325 mg by mouth 2 (two) times daily. Excell Seltzer, MD Taking Active Child  glucose blood (ACCU-CHEK AVIVA PLUS) test strip [id number] Yes Use to check blood sugar daily. Excell Seltzer, MD Taking Active Child  isosorbide mononitrate (IMDUR) 30 MG 24 hr tablet [id number] Yes TAKE 1 TABLET BY MOUTH EVERY DAY  Patient taking differently: Take 30 mg by mouth daily.   Rollene Rotunda, MD Taking Active Child  losartan (COZAAR) 100 MG tablet [id number] Yes Take 1 tablet (100 mg total) by mouth daily. Rollene Rotunda, MD Taking Active Child  Magnesium Gluconate (MAGNESIUM 27 PO) [id number] Yes Take by mouth. 1 daily [provider] Taking Active   omeprazole (PRILOSEC) 20 MG capsule [id number] Yes TAKE 1 CAPSULE BY MOUTH EVERY DAY  Patient taking differently: Take 20 mg by mouth daily.   Excell Seltzer, MD Taking Active Child  ondansetron (ZOFRAN-ODT) 4 MG disintegrating tablet [id number] Yes Take 1 tablet (4 mg total) by mouth every 8 (eight) hours as needed.  Patient taking differently: Take 4 mg by mouth every 8 (eight) hours as needed for nausea or vomiting.   Delton Prairie, MD Taking Active Child  Vitamin D, Ergocalciferol, (DRISDOL) 1.25 MG (50000 UNIT) CAPS capsule [id number] Yes TAKE 1 CAPSULE (50,000 UNITS TOTAL) BY MOUTH EVERY 7 (SEVEN) DAYS  Patient taking differently: Take 50,000 Units by mouth every Sunday.   Excell Seltzer, MD Taking Active Child            Patient Active Problem List   Diagnosis Date Noted   Mild dementia (HCC) 01/06/2022   Stage 3b chronic kidney disease (CKD) (HCC) 12/29/2021   Hypokalemia 12/29/2021   Hypomagnesemia 12/29/2021   Hyponatremia 12/26/2021   Cardiomegaly 12/26/2021   Malnutrition of moderate degree 12/26/2021   Hypotension due to hypovolemia 12/25/2021   Dehydration 12/25/2021   Elevated LFTs 12/25/2021    Hematoma 12/25/2021   Saccular aneurysm 09/05/2021   Secondary hyperparathyroidism (HCC) 04/11/2021   Ischemic vascular dementia (HCC) 04/11/2021   Secondary hypoparathyroidism (HCC) 04/08/2021   Hypertension associated with diabetes (HCC) 01/09/2020   Acquired trigger finger of left ring finger 07/11/2019   Osteoarthritis of both wrists 07/11/2019   Coronary artery disease involving native coronary artery of native heart without angina pectoris 10/24/2018   Senile purpura (HCC) 03/01/2018   History of total replacement of right shoulder joint 11/06/2016   Acute anemia 11/29/2015   Spinal stenosis, lumbar region, with neurogenic claudication 11/26/2015    Class: Chronic   Mixed incontinence urge and stress 10/29/2015  CKD stage 4 due to type 2 diabetes mellitus (HCC) 06/25/2015   Counseling regarding end of life decision making 10/26/2014   S/P total knee replacement using cement 04/03/2014   Atrial fibrillation (HCC) 10/17/2010   Allergic rhinitis 01/23/2009   ADENOCARCINOMA, COLON, CECUM 10/05/2008   Coronary atherosclerosis 11/23/2007   ANXIETY 09/19/2007   Major depressive disorder, recurrent episode, moderate (HCC) 09/19/2007   GERD 09/19/2007   Controlled type 2 diabetes mellitus with diabetic nephropathy (HCC) 03/21/2007   SYNDROME, CARPAL TUNNEL 03/21/2007   Hyperlipidemia associated with type 2 diabetes mellitus (HCC) 03/21/2007   IBS 03/21/2007   Osteoarthrosis, unspecified whether generalized or localized, unspecified site 03/21/2007    Immunization History  Administered Date(s) Administered   Influenza Split 06/29/2011, 07/08/2011, 06/17/2013   Influenza Whole 07/01/2007, 06/18/2008, 07/12/2009, 07/17/2010   Influenza, High Dose Seasonal PF 06/25/2014, 06/10/2015, 06/06/2016, 05/26/2017, 05/28/2018, 05/21/2019, 05/16/2020, 06/08/2021   Moderna Sars-Covid-2 Vaccination 01/25/2020, 02/22/2020   PFIZER Comirnaty(Gray Top)Covid-19 Tri-Sucrose Vaccine 02/14/2021    PFIZER(Purple Top)SARS-COV-2 Vaccination 09/08/2020   Pneumococcal Conjugate-13 10/26/2014   Pneumococcal Polysaccharide-23 09/29/2003, 05/20/2012   Td 07/12/2009    Conditions to be addressed/monitored:  Hypertension, Hyperlipidemia, Diabetes, and Atrial Fibrillation  There are no care plans that you recently modified to display for this patient.    Medication Assistance: {MEDASSISTANCEINFO:25044}  Compliance/Adherence/Medication fill history: Care Gaps: DEXA (due 11/02/19) - scheduled Mammogram - scheduled Eye exam (due 09/12/21)  Star-Rating Drugs: ***  Patient's preferred pharmacy is:  [id number] [place], [place] - [address] [place] [address] Phone: [phone] Fax: [phone]  Uses pill box? {Yes or If no, why not?:20788} Pt endorses ***% compliance  We discussed: {Pharmacy options:24294} Patient decided to: {US Pharmacy Plan:23885}  Care Plan and Follow Up Patient Decision:  {FOLLOWUP:24991}  Plan: {CM FOLLOW UP ZHYQ:65784}  ***    Current Barriers:  {pharmacybarriers:24917}  Pharmacist Clinical Goal(s):  Patient will {PHARMACYGOALCHOICES:24921} through collaboration with PharmD and provider.   Interventions: 1:1 collaboration with Excell Seltzer, MD regarding development and update of comprehensive plan of care as evidenced by provider attestation and co-signature Inter-disciplinary care team collaboration (see longitudinal plan of care) Comprehensive medication review performed; medication list updated in electronic medical record  Hypertension (BP goal <140/90) -{US controlled/uncontrolled:25276} -Current treatment: Carvedilol 12.5 mg BID Diltiazem CD 180 mg daily Isosorbide MN 30 mg daily Losartan 100 mg daily -Medications previously tried: ***  -Current home readings: *** -Current dietary habits: *** -Current exercise habits: *** -{ACTIONS;DENIES/REPORTS:21021675::"Denies"} hypotensive/hypertensive  symptoms -Educated on {CCM BP Counseling:25124} -Counseled to monitor BP at home ***, document, and provide log at future appointments -{CCMPHARMDINTERVENTION:25122}  Hyperlipidemia / CAD (LDL goal < 70) -{US controlled/uncontrolled:25276} -Current treatment: Atorvastatin 20 mg daily -Medications previously tried: ***  -Current dietary patterns: *** -Current exercise habits: *** -Educated on {CCM HLD Counseling:25126} -{CCMPHARMDINTERVENTION:25122}  Diabetes (A1c goal <7%) -Diet-controlled -Medications previously tried: glipizide, metformin, Januvia -Current home glucose readings fasting glucose: *** post prandial glucose: *** -{ACTIONS;DENIES/REPORTS:21021675::"Denies"} hypoglycemic/hyperglycemic symptoms -Current meal patterns:  breakfast: ***  lunch: ***  dinner: *** snacks: *** drinks: *** -Current exercise: *** -Educated on {CCM DM COUNSELING:25123} -Counseled to check feet daily and get yearly eye exams -{CCMPHARMDINTERVENTION:25122}  Atrial Fibrillation (Goal: prevent stroke and major bleeding) -{US controlled/uncontrolled:25276} -CHADSVASC: 6 -Current treatment: Carvedilol 12.5 mg BID Diltiazem CD 180 mg daily Eliquis 2.5 mg BID -Medications previously tried: *** -Home BP and HR readings: ***  -Counseled on {CCMAFIBCOUNSELING:25120} -{CCMPHARMDINTERVENTION:25122}  Patient Goals/Self-Care Activities Patient will:  - {pharmacypatientgoals:24919}

## 2022-01-06 NOTE — Patient Instructions (Addendum)
Please stop at the lab to have labs drawn. ? Keep follow up with Dr. Edison Nasuti. ? Call with update on hematoma in 1-2 weeks. ?

## 2022-01-07 ENCOUNTER — Other Ambulatory Visit: Payer: Self-pay | Admitting: Family Medicine

## 2022-01-07 ENCOUNTER — Telehealth: Payer: Self-pay

## 2022-01-07 DIAGNOSIS — I152 Hypertension secondary to endocrine disorders: Secondary | ICD-10-CM | POA: Diagnosis not present

## 2022-01-07 DIAGNOSIS — C180 Malignant neoplasm of cecum: Secondary | ICD-10-CM | POA: Diagnosis not present

## 2022-01-07 DIAGNOSIS — F03A18 Unspecified dementia, mild, with other behavioral disturbance: Secondary | ICD-10-CM

## 2022-01-07 DIAGNOSIS — N184 Chronic kidney disease, stage 4 (severe): Secondary | ICD-10-CM | POA: Diagnosis not present

## 2022-01-07 DIAGNOSIS — D631 Anemia in chronic kidney disease: Secondary | ICD-10-CM | POA: Diagnosis not present

## 2022-01-07 DIAGNOSIS — E1159 Type 2 diabetes mellitus with other circulatory complications: Secondary | ICD-10-CM | POA: Diagnosis not present

## 2022-01-07 DIAGNOSIS — E1122 Type 2 diabetes mellitus with diabetic chronic kidney disease: Secondary | ICD-10-CM | POA: Diagnosis not present

## 2022-01-07 NOTE — Telephone Encounter (Signed)
Noted and agree. 

## 2022-01-07 NOTE — Telephone Encounter (Signed)
New message   Need verbal for physical therapy   One week x 1  Two week x 3  One week x 3

## 2022-01-07 NOTE — Telephone Encounter (Signed)
Verbal orders given to [facility] for PT: ?  ?One x week for 1 week, then ?  ?Two x week for 3 weeks, then ?  ?One x week for 3 weeks. ?

## 2022-01-08 ENCOUNTER — Telehealth: Payer: Self-pay

## 2022-01-08 DIAGNOSIS — I152 Hypertension secondary to endocrine disorders: Secondary | ICD-10-CM | POA: Diagnosis not present

## 2022-01-08 DIAGNOSIS — N184 Chronic kidney disease, stage 4 (severe): Secondary | ICD-10-CM | POA: Diagnosis not present

## 2022-01-08 DIAGNOSIS — C180 Malignant neoplasm of cecum: Secondary | ICD-10-CM | POA: Diagnosis not present

## 2022-01-08 DIAGNOSIS — E1122 Type 2 diabetes mellitus with diabetic chronic kidney disease: Secondary | ICD-10-CM | POA: Diagnosis not present

## 2022-01-08 DIAGNOSIS — E1159 Type 2 diabetes mellitus with other circulatory complications: Secondary | ICD-10-CM | POA: Diagnosis not present

## 2022-01-08 DIAGNOSIS — D631 Anemia in chronic kidney disease: Secondary | ICD-10-CM | POA: Diagnosis not present

## 2022-01-08 NOTE — Chronic Care Management (AMB) (Signed)
?  Chronic Care Management  ? ?Note ? ?01/08/2022 ?Name: Tonya [name] MRN: [id number] DOB: [birth year] ? ?Tonya [name] is a 81 y.o. year old female who is a primary care patient of [name], MD. Tonya [name] is currently enrolled in care management services. An additional referral for LCSW was placed.  ? ?Follow up plan: ?Telephone appointment with care management team member scheduled for:01/16/2022 ? ?Noreene Larsson, RMA ?Care Guide, Embedded Care Coordination ?[facility]  ?[place], [place] [address] ?Direct Dial: [phone] ?Museum/[email or web address] ?[email or web address]: [email or web address]  ? ?

## 2022-01-09 ENCOUNTER — Telehealth: Payer: Self-pay | Admitting: Family Medicine

## 2022-01-09 DIAGNOSIS — S8011XS Contusion of right lower leg, sequela: Secondary | ICD-10-CM

## 2022-01-09 MED ORDER — DOXYCYCLINE HYCLATE 100 MG PO TABS: 100.0000 mg | ORAL_TABLET | Freq: Two times a day (BID) | ORAL | 0 refills | Status: DC

## 2022-01-09 NOTE — Telephone Encounter (Signed)
Notified pt daughter (Julie)--sent antibiotic to the pharmacy for the pt and referral to surgeon  for to be seen next week per Dr. Diona Browner. ?

## 2022-01-09 NOTE — Telephone Encounter (Signed)
Please call daughter.  Given increase in redness and warmth around the hematoma I will treat with a course of antibiotics and make a referral to a surgeon for to be seen next week ?

## 2022-01-09 NOTE — Telephone Encounter (Signed)
Celina called to inform below: ? ?Pt is having redness and warmth around the hematoma. She was told to relieve the pressure for about half and hour. Just wants Dr. Diona Browner to know. ? ?Callback Number: [phone] ?

## 2022-01-12 ENCOUNTER — Telehealth: Payer: Self-pay | Admitting: Family Medicine

## 2022-01-12 DIAGNOSIS — C180 Malignant neoplasm of cecum: Secondary | ICD-10-CM | POA: Diagnosis not present

## 2022-01-12 DIAGNOSIS — D631 Anemia in chronic kidney disease: Secondary | ICD-10-CM | POA: Diagnosis not present

## 2022-01-12 DIAGNOSIS — E1159 Type 2 diabetes mellitus with other circulatory complications: Secondary | ICD-10-CM | POA: Diagnosis not present

## 2022-01-12 DIAGNOSIS — E1122 Type 2 diabetes mellitus with diabetic chronic kidney disease: Secondary | ICD-10-CM | POA: Diagnosis not present

## 2022-01-12 DIAGNOSIS — I152 Hypertension secondary to endocrine disorders: Secondary | ICD-10-CM | POA: Diagnosis not present

## 2022-01-12 DIAGNOSIS — N184 Chronic kidney disease, stage 4 (severe): Secondary | ICD-10-CM | POA: Diagnosis not present

## 2022-01-12 NOTE — Telephone Encounter (Signed)
I was not in the office on Friday when this Urgent Referral was placed. No one was notified that of the urgency.  ? ?I have sent the referral to [facility] and the patient daughter Almyra Free has been made aware of where to call to schedule.  ?

## 2022-01-12 NOTE — Telephone Encounter (Signed)
Pt daughter called checking on the status of pt referral for hematoma on right upper leg. Pt is asking for a call back today. Please advise. ? ?

## 2022-01-13 ENCOUNTER — Telehealth: Payer: Self-pay | Admitting: Family Medicine

## 2022-01-13 DIAGNOSIS — E1159 Type 2 diabetes mellitus with other circulatory complications: Secondary | ICD-10-CM | POA: Diagnosis not present

## 2022-01-13 DIAGNOSIS — E1122 Type 2 diabetes mellitus with diabetic chronic kidney disease: Secondary | ICD-10-CM | POA: Diagnosis not present

## 2022-01-13 DIAGNOSIS — N184 Chronic kidney disease, stage 4 (severe): Secondary | ICD-10-CM | POA: Diagnosis not present

## 2022-01-13 DIAGNOSIS — I152 Hypertension secondary to endocrine disorders: Secondary | ICD-10-CM | POA: Diagnosis not present

## 2022-01-13 DIAGNOSIS — D631 Anemia in chronic kidney disease: Secondary | ICD-10-CM | POA: Diagnosis not present

## 2022-01-13 DIAGNOSIS — C180 Malignant neoplasm of cecum: Secondary | ICD-10-CM | POA: Diagnosis not present

## 2022-01-13 NOTE — Telephone Encounter (Signed)
Home Health verbal orders ?Caller Name:Zacharia ?Agency Name: [facility] ? ?Callback number: [phone] ? ?Requesting OT/PT/Skilled nursing/Social Work/Speech: ? ?Reason:OT ? ?Frequency:1 wk for 5 wks          ADL and IADL transfer and dynamic standing balance ? ?Please forward to [facility] pool or providers CMA  ?

## 2022-01-13 NOTE — Telephone Encounter (Signed)
Verbal orders given to [facility] via telephone for OT 1 x week for 5 weeks.  ?

## 2022-01-13 NOTE — Telephone Encounter (Signed)
Agree with verbal orders given ?

## 2022-01-13 NOTE — Telephone Encounter (Signed)
Left message for Philis Nettle to return my call in regards to orders requested.   ?

## 2022-01-15 ENCOUNTER — Ambulatory Visit: Payer: Medicare Other | Admitting: Surgery

## 2022-01-15 ENCOUNTER — Encounter: Payer: Self-pay | Admitting: Surgery

## 2022-01-15 VITALS — BP 129/80 | HR 116 | Temp 98.4°F | Ht 65.0 in | Wt 155.0 lb

## 2022-01-15 DIAGNOSIS — T148XXA Other injury of unspecified body region, initial encounter: Secondary | ICD-10-CM

## 2022-01-15 DIAGNOSIS — N184 Chronic kidney disease, stage 4 (severe): Secondary | ICD-10-CM | POA: Diagnosis not present

## 2022-01-15 DIAGNOSIS — R2241 Localized swelling, mass and lump, right lower limb: Secondary | ICD-10-CM

## 2022-01-15 DIAGNOSIS — E1122 Type 2 diabetes mellitus with diabetic chronic kidney disease: Secondary | ICD-10-CM | POA: Diagnosis not present

## 2022-01-15 DIAGNOSIS — D631 Anemia in chronic kidney disease: Secondary | ICD-10-CM | POA: Diagnosis not present

## 2022-01-15 DIAGNOSIS — I152 Hypertension secondary to endocrine disorders: Secondary | ICD-10-CM | POA: Diagnosis not present

## 2022-01-15 DIAGNOSIS — M7981 Nontraumatic hematoma of soft tissue: Secondary | ICD-10-CM

## 2022-01-15 DIAGNOSIS — C180 Malignant neoplasm of cecum: Secondary | ICD-10-CM | POA: Diagnosis not present

## 2022-01-15 DIAGNOSIS — E1159 Type 2 diabetes mellitus with other circulatory complications: Secondary | ICD-10-CM | POA: Diagnosis not present

## 2022-01-15 NOTE — Progress Notes (Signed)
Patient ID: Tonya [name], female   DOB: 1940/11/13, 81 y.o.   MRN: [id number] ? ?Chief Complaint: Right lateral thigh hematoma ? ?History of Present Illness ?Tonya [name] is a 81 y.o. female with a lingering right lateral thigh hematoma, now 4 weeks since the fall caused it.  Had the development of some excoriation/epidermal compromise that mimicked or appears infected.  Took a course of antibiotics with a diminishment in the size of the hematoma during that interval.  She reports a very little pain, denies nausea, vomiting, fevers and chills.  Presents today for lingering mass in the lateral right thigh and concerns about persisting infection, expediting drainage/resolution.  She continues to take Eliquis and last took it this morning. ? ?Past Medical History ?Past Medical History:  ?Diagnosis Date  ? Adenocarcinoma, colon ([place])   ? Anemia   ? iron deficiency  ? Anxiety   ? Carpal tunnel syndrome   ? Cataract   ? REMOVED BILATERAL  ? Chronic diarrhea   ? Colon polyps   ? Tubular adenoma  ? Degenerative disk disease   ? Depression   ? Diabetes mellitus, type 2 ([place])   ? Dysrhythmia   ? h/o AFIB  ? GERD (gastroesophageal reflux disease)   ? History of blood transfusion   ? After surgeries  ? Hyperlipidemia   ? Hypertension   ? IBS (irritable bowel syndrome)   ? Kidney disorder   ? Osteoarthritis   ? Osteoporosis   ? PONV (postoperative nausea and vomiting)   ? S/P coronary artery stent placement 2007  ?  ? ? ?Past Surgical History:  ?Procedure Laterality Date  ? CARDIAC CATHETERIZATION  12/10  ? LAD stent patent. Insignificant CAD, otherwise EF 60-65%  ? CARPAL TUNNEL RELEASE    ? CATARACT EXTRACTION  03/2015  ? CHOLECYSTECTOMY    ? COLON SURGERY    ? COLONOSCOPY    ? DILATION AND CURETTAGE OF UTERUS    ? HEMATOMA EVACUATION    ? after knee replacement  ? KNEE ARTHROSCOPY    ? bilateral  ? LUMBAR LAMINECTOMY N/A 11/26/2015  ? Procedure: LUMBAR DECOMPRESSIVE LAMINECTOMY L3-4 AND L4-5, LEFT L2-3 HEMILAMINECTOMY;   Surgeon: Jessy Oto, MD;  Location: [facility];  Service: Orthopedics;  Laterality: N/A;  ? ORIF ELBOW FRACTURE Left 03/18/2021  ? Procedure: OPEN REDUCTION INTERNAL FIXATION (ORIF) ELBOW/OLECRANON FRACTURE;  Surgeon: Renette Butters, MD;  Location: [facility];  Service: Orthopedics;  Laterality: Left;  ? PARTIAL COLECTOMY  11/2008  ? right, for adenocarcinoma  ? REPLACEMENT TOTAL KNEE  04/2010  ? right, complicated by hemarthrosis   ? REVERSE SHOULDER ARTHROPLASTY Right 05/28/2016  ? REVERSE SHOULDER ARTHROPLASTY Right 05/28/2016  ? Procedure: REVERSE SHOULDER ARTHROPLASTY;  Surgeon: Meredith Pel, MD;  Location: [facility];  Service: Orthopedics;  Laterality: Right;  ? TONSILLECTOMY    ? TOTAL KNEE ARTHROPLASTY Left 04/03/2014  ? dr whitfield  ? TOTAL KNEE ARTHROPLASTY Left 04/03/2014  ? Procedure: TOTAL KNEE ARTHROPLASTY;  Surgeon: Garald Balding, MD;  Location: [facility];  Service: Orthopedics;  Laterality: Left;  ? TOTAL KNEE REVISION  03/08/2012  ? Procedure: TOTAL KNEE REVISION;  Surgeon: Garald Balding, MD;  Location: [facility];  Service: Orthopedics;  Laterality: Right;  removal total knee hardware and placement of antibiotic cement spacer and antibiotic beads  ? TOTAL KNEE REVISION  05/17/2012  ? Procedure: TOTAL KNEE REVISION;  Surgeon: Garald Balding, MD;  Location: [facility];  Service: Orthopedics;  Laterality:  Right;  right total knee revision, removal of antibiotic spacer  ? ? ?Allergies  ?Allergen Reactions  ? Amoxicillin-Pot Clavulanate Swelling and Other (See Comments)  ?  Augmentin per patient, tongue swelling  ? Nitrofurantoin Diarrhea  ?  Nausea, jaundice, liver enzymes abnormal.  ? ? ?Current Outpatient Medications  ?Medication Sig Dispense Refill  ? atorvastatin (LIPITOR) 20 MG tablet TAKE 1 TABLET BY MOUTH EVERY DAY (Patient taking differently: Take 20 mg by mouth daily.) 90 tablet 1  ? Blood Glucose Monitoring Suppl (ACCU-CHEK AVIVA PLUS) w/Device KIT Use to check blood once sugar daily. 1 kit 0  ? Calcium  Carbonate-Vit D-Min (CALCIUM 1200 PO) Take by mouth. 1 daily    ? carvedilol (COREG) 12.5 MG tablet TAKE 1 TABLET (12.$RemoveBefo'5MG'pAAjvXaPbcJ$  TOTAL) BY MOUTH TWICE A DAY WITH MEALS (Patient taking differently: Take 12.5 mg by mouth 2 (two) times daily with a meal.) 180 tablet 3  ? cetirizine (ZYRTEC) 10 MG tablet Take 10 mg by mouth at bedtime.    ? cholestyramine light (PREVALITE) 4 g packet USE 1 PACKET TWICE DAILY WITH A MEAL, AT LEAST 2 HOURS AWAY FROM OTHER MEDS  Patient needs an appointment for further refills. (Patient taking differently: Take 4 g by mouth 2 (two) times daily with a meal.) 540 packet 0  ? citalopram (CELEXA) 40 MG tablet TAKE 1 TABLET BY MOUTH EVERY DAY (Patient taking differently: Take 40 mg by mouth daily.) 90 tablet 1  ? diltiazem (CARDIZEM CD) 180 MG 24 hr capsule TAKE 1 CAPSULE BY MOUTH EVERY DAY (Patient taking differently: Take 180 mg by mouth daily.) 90 capsule 3  ? doxycycline (VIBRA-TABS) 100 MG tablet Take 1 tablet (100 mg total) by mouth 2 (two) times daily. 20 tablet 0  ? ELIQUIS 2.5 MG TABS tablet TAKE 1 TABLET BY MOUTH TWICE A DAY (Patient taking differently: Take 2.5 mg by mouth 2 (two) times daily.) 60 tablet 11  ? ferrous sulfate 325 (65 FE) MG EC tablet Take 325 mg by mouth 2 (two) times daily.    ? glucose blood (ACCU-CHEK AVIVA PLUS) test strip Use to check blood sugar daily. 100 each 3  ? isosorbide mononitrate (IMDUR) 30 MG 24 hr tablet TAKE 1 TABLET BY MOUTH EVERY DAY (Patient taking differently: Take 30 mg by mouth daily.) 90 tablet 2  ? losartan (COZAAR) 100 MG tablet Take 1 tablet (100 mg total) by mouth daily. 90 tablet 3  ? Magnesium Gluconate (MAGNESIUM 27 PO) Take by mouth. 1 daily    ? omeprazole (PRILOSEC) 20 MG capsule TAKE 1 CAPSULE BY MOUTH EVERY DAY (Patient taking differently: Take 20 mg by mouth daily.) 90 capsule 3  ? ondansetron (ZOFRAN-ODT) 4 MG disintegrating tablet Take 1 tablet (4 mg total) by mouth every 8 (eight) hours as needed. (Patient taking differently: Take 4  mg by mouth every 8 (eight) hours as needed for nausea or vomiting.) 20 tablet 0  ? Vitamin D, Ergocalciferol, (DRISDOL) 1.25 MG (50000 UNIT) CAPS capsule TAKE 1 CAPSULE (50,000 UNITS TOTAL) BY MOUTH EVERY 7 (SEVEN) DAYS (Patient taking differently: Take 50,000 Units by mouth every Sunday.) 12 capsule 0  ? ?No current facility-administered medications for this visit.  ? ? ?Family History ?Family History  ?Problem Relation Age of Onset  ? Heart disease Mother   ?     massive MI age 54  ? Heart attack Mother   ? Heart disease Father   ?     Massive MI age 35  ?  Prostate cancer Father   ? Colon polyps Sister   ? Diabetes Paternal Grandmother   ? Colon cancer Neg Hx   ? Esophageal cancer Neg Hx   ? Pancreatic cancer Neg Hx   ? Stomach cancer Neg Hx   ? Liver disease Neg Hx   ?  ? ? ?Social History ?Social History  ? ?Tobacco Use  ? Smoking status: Never  ? Smokeless tobacco: Never  ?Vaping Use  ? Vaping Use: Never used  ?Substance Use Topics  ? Alcohol use: No  ?  Alcohol/week: 0.0 standard drinks  ? Drug use: No  ?  ?  ? ? ?Review of Systems  ?Constitutional:  Positive for malaise/fatigue and weight loss.  ?HENT: Negative.    ?Eyes: Negative.   ?Respiratory: Negative.    ?Cardiovascular: Negative.   ?Gastrointestinal:  Positive for constipation.  ?Genitourinary: Negative.   ?Skin: Negative.   ?Neurological: Negative.   ?Endo/Heme/Allergies:  Bruises/bleeds easily.  ?Psychiatric/Behavioral:  Positive for depression and memory loss (Associated dementia).   ?  ? ?Physical Exam ?There were no vitals taken for this visit. ? ? ?CONSTITUTIONAL: Well developed, and nourished, appropriately responsive and aware without distress.   ?EYES: Sclera non-icteric.   ?EARS, NOSE, MOUTH AND THROAT:  The oropharynx is clear. Oral mucosa is pink and moist.    Hearing is intact to voice.  ?NECK: Trachea is midline, and there is no jugular venous distension.  ?LYMPH NODES:  Lymph nodes in the neck are not enlarged. ?RESPIRATORY:  Normal  respiratory effort without pathologic use of accessory muscles. ?CARDIOVASCULAR: Heart is regular in rate. ?GI: The abdomen is soft, nontender, and nondistended. There were no palpable masses. I did not ap

## 2022-01-15 NOTE — Patient Instructions (Addendum)
If you have any concerns or questions, please feel free to call our office. See follow up appointment below.  ? ?Hematoma ?A hematoma is a collection of blood. A hematoma can happen: ?Under the skin. ?In an organ. ?In a body space. ?In a joint space. ?In other tissues. ?The blood can thicken (clot) to form a lump that you can see and feel. The lump is often hard and may become sore and tender. The lump can be very small or very big. Most hematomas get better in a few days to weeks. However, some hematomas may be serious and need medical care. ?What are the causes? ?This condition is caused by: ?An injury. ?Blood that leaks under the skin. ?Problems from surgeries. ?Medical conditions that cause bleeding or bruising. ?What increases the risk? ?You are more likely to develop this condition if: ?You are an older adult. ?You use medicines that thin your blood. ?What are the signs or symptoms? ?Symptoms depend on where the hematoma is in your body. ?If the hematoma is under the skin, there is: ?A firm lump on the body. ?Pain and tenderness in the area. ?Bruising. The skin above the lump may be blue, dark blue, purple-red, or yellowish. ?If the hematoma is deep in the tissues or body spaces, there may be: ?Blood in the stomach. This may cause pain in the belly (abdomen), weakness, passing out (fainting), and shortness of breath. ?Blood in the head. This may cause a headache, weakness, trouble speaking or understanding speech, or passing out. ?How is this diagnosed? ?This condition is diagnosed based on: ?Your medical history. ?A physical exam. ?Imaging tests, such as ultrasound or CT scan. ?Blood tests. ?How is this treated? ?Treatment depends on the cause, size, and location of the hematoma. Treatment may include: ?Doing nothing. Many hematomas go away on their own without treatment. ?Surgery or close monitoring. This may be needed for large hematomas or hematomas that affect the body's organs. ?Medicines. These may be  given if a medical condition caused the hematoma. ?Follow these instructions at home: ?Managing pain, stiffness, and swelling ? ?If told, put ice on the area. ?Put ice in a plastic bag. ?Place a towel between your skin and the bag. ?Leave the ice on for 20 minutes, 2-3 times a day for the first two days. ?If told, put heat on the affected area after putting ice on the area for two days. Use the heat source that your doctor tells you to use. This could be a moist heat pack or a heating pad. To do this: ?Place a towel between your skin and the heat source. ?Leave the heat on for 20-30 minutes. ?Remove the heat if your skin turns bright red. This is very important if you are unable to feel pain, heat, or cold. You may have a greater risk of getting burned. ?Raise (elevate) the affected area above the level of your heart while you are sitting or lying down. ?Wrap the affected area with an elastic bandage, if told by your doctor. Do not wrap the bandage too tightly. ?If your hematoma is on a leg or foot and is painful, your doctor may give you crutches. Use them as told by your doctor. ?General instructions ?Take over-the-counter and prescription medicines only as told by your doctor. ?Keep all follow-up visits as told by your doctor. This is important. ?Contact a doctor if: ?You have a fever. ?The swelling or bruising gets worse. ?You start to get more hematomas. ?Get help right away if: ?Your  pain gets worse. ?Your pain is not getting better with medicine. ?Your skin over the hematoma breaks or starts to bleed. ?Your hematoma is in your chest or belly and you: ?Pass out. ?Feel weak. ?Become short of breath. ?You have a hematoma on your scalp that is caused by a fall or injury, and you: ?Have a headache that gets worse. ?Have trouble speaking or understanding speech. ?Become less alert or you pass out. ?Summary ?A hematoma is a collection of blood in any part of your body. ?Most hematomas get better on their own in a  few days to weeks. Some may need medical care. ?Follow instructions from your doctor about how to care for your hematoma. ?Contact a doctor if the swelling or bruising gets worse, or if you are short of breath. ?This information is not intended to replace advice given to you by your health care provider. Make sure you discuss any questions you have with your health care provider. ?Document Revised: 07/10/2021 Document Reviewed: 07/10/2021 ?Elsevier Patient Education ? [place]. ? ?

## 2022-01-16 ENCOUNTER — Ambulatory Visit (INDEPENDENT_AMBULATORY_CARE_PROVIDER_SITE_OTHER): Payer: Medicare Other | Admitting: *Deleted

## 2022-01-16 DIAGNOSIS — F0150 Vascular dementia without behavioral disturbance: Secondary | ICD-10-CM

## 2022-01-16 DIAGNOSIS — F03A18 Unspecified dementia, mild, with other behavioral disturbance: Secondary | ICD-10-CM

## 2022-01-16 NOTE — Patient Instructions (Signed)
Visit Information ? ?Thank you for taking time to visit with me today. Please don't hesitate to contact me if I can be of assistance to you before our next scheduled telephone appointment. ? ?Following are the goals we discussed today:  ?Consider :  ?(life alert, Adult Day Care programs, private duty care, in-home aide program, PACE ) ? ?Our next appointment is by telephone on 02/13/22 at 2pm ? ?Please call the care guide team at [phone] if you need to cancel or reschedule your appointment.  ? ?If you are experiencing a Mental Health or [place] or need someone to talk to, please call the [place] National Suicide Prevention Lifeline: 9787785234 or TTY: (623)294-4034 TTY [phone]) to talk to a trained counselor ?call 911  ? ?Patient verbalizes understanding of instructions and care plan provided today and agrees to view in [facility]. Active MyChart status confirmed with patient.   ? ?Eduard Clos MSW, LCSW ?Licensed Clinical Social Worker ?[place]   ?[phone]  ?

## 2022-01-16 NOTE — Chronic Care Management (AMB) (Signed)
Care Management Clinical Social Work Note  01/16/2022 Name: Tonya [name] MRN: [id number] DOB: [birth year]  Tonya [name] is a 81 y.o. year old female who is a primary care patient of Bedsole, Amy E, MD.  The Care Management team was consulted for assistance with chronic disease management and coordination needs.  Engaged with patient by telephone for initial visit in response to provider referral for social work chronic care management and care coordination services  Consent to Services:  Ms. Ungar was given information about Care Management services today including:  Care Management services includes personalized support from designated clinical staff supervised by her physician, including individualized plan of care and coordination with other care providers 24/7 contact phone numbers for assistance for urgent and routine care needs. The patient may stop case management services at any time by phone call to the office staff.  Patient agreed to services and consent obtained.   Assessment: Review of patient past medical history, allergies, medications, and health status, including review of relevant consultants reports was performed today as part of a comprehensive evaluation and provision of chronic care management and care coordination services.  SDOH (Social Determinants of Health) assessments and interventions performed:    Advanced Directives Status: See Care Plan for related entries.  Care Plan  Allergies  Allergen Reactions   Amoxicillin-Pot Clavulanate Swelling and Other (See Comments)    Augmentin per patient, tongue swelling   Nitrofurantoin Diarrhea    Nausea, jaundice, liver enzymes abnormal.    Outpatient Encounter Medications as of 01/16/2022  Medication Sig   atorvastatin (LIPITOR) 20 MG tablet TAKE 1 TABLET BY MOUTH EVERY DAY (Patient taking differently: Take 20 mg by mouth daily.)   Blood Glucose Monitoring Suppl (ACCU-CHEK AVIVA PLUS) w/Device KIT Use to check  blood once sugar daily.   Calcium Carbonate-Vit D-Min (CALCIUM 1200 PO) Take by mouth. 1 daily   carvedilol (COREG) 12.5 MG tablet TAKE 1 TABLET (12.5MG  TOTAL) BY MOUTH TWICE A DAY WITH MEALS (Patient taking differently: Take 12.5 mg by mouth 2 (two) times daily with a meal.)   cetirizine (ZYRTEC) 10 MG tablet Take 10 mg by mouth at bedtime.   cholestyramine light (PREVALITE) 4 g packet USE 1 PACKET TWICE DAILY WITH A MEAL, AT LEAST 2 HOURS AWAY FROM OTHER MEDS  Patient needs an appointment for further refills. (Patient taking differently: Take 4 g by mouth 2 (two) times daily with a meal.)   citalopram (CELEXA) 40 MG tablet TAKE 1 TABLET BY MOUTH EVERY DAY (Patient taking differently: Take 40 mg by mouth daily.)   diltiazem (CARDIZEM CD) 180 MG 24 hr capsule TAKE 1 CAPSULE BY MOUTH EVERY DAY (Patient taking differently: Take 180 mg by mouth daily.)   doxycycline (VIBRA-TABS) 100 MG tablet Take 1 tablet (100 mg total) by mouth 2 (two) times daily.   ELIQUIS 2.5 MG TABS tablet TAKE 1 TABLET BY MOUTH TWICE A DAY (Patient taking differently: Take 2.5 mg by mouth 2 (two) times daily.)   ferrous sulfate 325 (65 FE) MG EC tablet Take 325 mg by mouth 2 (two) times daily.   glucose blood (ACCU-CHEK AVIVA PLUS) test strip Use to check blood sugar daily.   isosorbide mononitrate (IMDUR) 30 MG 24 hr tablet TAKE 1 TABLET BY MOUTH EVERY DAY (Patient taking differently: Take 30 mg by mouth daily.)   losartan (COZAAR) 100 MG tablet Take 1 tablet (100 mg total) by mouth daily.   Magnesium Gluconate (MAGNESIUM 27 PO) Take by mouth. 1 daily  omeprazole (PRILOSEC) 20 MG capsule TAKE 1 CAPSULE BY MOUTH EVERY DAY (Patient taking differently: Take 20 mg by mouth daily.)   ondansetron (ZOFRAN-ODT) 4 MG disintegrating tablet Take 1 tablet (4 mg total) by mouth every 8 (eight) hours as needed. (Patient taking differently: Take 4 mg by mouth every 8 (eight) hours as needed for nausea or vomiting.)   Vitamin D,  Ergocalciferol, (DRISDOL) 1.25 MG (50000 UNIT) CAPS capsule TAKE 1 CAPSULE (50,000 UNITS TOTAL) BY MOUTH EVERY 7 (SEVEN) DAYS (Patient taking differently: Take 50,000 Units by mouth every Sunday.)   No facility-administered encounter medications on file as of 01/16/2022.    Patient Active Problem List   Diagnosis Date Noted   Mild dementia (HCC) 01/06/2022   Stage 3b chronic kidney disease (CKD) (HCC) 12/29/2021   Hypokalemia 12/29/2021   Hypomagnesemia 12/29/2021   Hyponatremia 12/26/2021   Cardiomegaly 12/26/2021   Malnutrition of moderate degree 12/26/2021   Hypotension due to hypovolemia 12/25/2021   Dehydration 12/25/2021   Elevated LFTs 12/25/2021   Hematoma 12/25/2021   Saccular aneurysm 09/05/2021   Secondary hyperparathyroidism (HCC) 04/11/2021   Ischemic vascular dementia (HCC) 04/11/2021   Secondary hypoparathyroidism (HCC) 04/08/2021   Hypertension associated with diabetes (HCC) 01/09/2020   Acquired trigger finger of left ring finger 07/11/2019   Osteoarthritis of both wrists 07/11/2019   Coronary artery disease involving native coronary artery of native heart without angina pectoris 10/24/2018   Senile purpura (HCC) 03/01/2018   History of total replacement of right shoulder joint 11/06/2016   Acute anemia 11/29/2015   Spinal stenosis, lumbar region, with neurogenic claudication 11/26/2015    Class: Chronic   Mixed incontinence urge and stress 10/29/2015   CKD stage 4 due to type 2 diabetes mellitus (HCC) 06/25/2015   Counseling regarding end of life decision making 10/26/2014   S/P total knee replacement using cement 04/03/2014   Atrial fibrillation (HCC) 10/17/2010   Allergic rhinitis 01/23/2009   ADENOCARCINOMA, COLON, CECUM 10/05/2008   Coronary atherosclerosis 11/23/2007   ANXIETY 09/19/2007   Major depressive disorder, recurrent episode, moderate (HCC) 09/19/2007   GERD 09/19/2007   Controlled type 2 diabetes mellitus with diabetic nephropathy (HCC)  03/21/2007   SYNDROME, CARPAL TUNNEL 03/21/2007   Hyperlipidemia associated with type 2 diabetes mellitus (HCC) 03/21/2007   IBS 03/21/2007   Osteoarthrosis, unspecified whether generalized or localized, unspecified site 03/21/2007    Conditions to be addressed/monitored:  dementia ; Financial constraints related to limited income, Limited social support, Level of care concerns, Limited access to caregiver, Cognitive Deficits, and Lacks knowledge of community resource:    Care Plan : LCSW Plan of Care  Updates made by Buck Mam, LCSW since 01/16/2022 12:00 AM     Problem: Caregiver Stress/Need for resources, support and guidance   Priority: High     Long-Range Goal: Provide education, resources, support for long term care planning/needs   Start Date: 01/16/2022  Expected End Date: 04/26/2022  This Visit's Progress: On track  Priority: High  Note:   Current Barriers:  Care Coordination needs related to Dementia: - long term care planning, caregiver support, etc CSW Clinical Goal(s):  POA  will patient will work with Care Guide, CSW and others to address needs related to dementia/care needs  through collaboration with Clinical Social Worker, provider, and care team.   Interventions:  CSW spoke with pt's daughter/HPOA, Raynelle Fanning, who reports pt resides with her and her family- daughter looking for resources and options for care/support with her mom who has dementia.  Daughter has investigated and has been resourceful in seeking options already; her income is too high for Medicaid and may be eligible for "survivor's benefit" through the [place].  CSW discussed at length with daughter options and possible programs and services to consider- (life alert, Adult Day Care programs, private duty care, in-home aide program, PACE ).  Care Guide to followup with her as well.   1:1 collaboration with primary care provider regarding development and update of comprehensive plan of care as evidenced by  provider attestation and co-signature Inter-disciplinary care team collaboration (see longitudinal plan of care) Evaluation of current treatment plan related to  self management and patient's adherence to plan as established by provider Review resources, discussed options and provided patient information about  Referral to care guide (life alert, Adult Day Care programs, private duty care, in-home aide program, PACE )     Dementia:  (Status: New goal.) Evaluation of current treatment plan related to misuse of: Alzheimer's dementia Motivational Interviewing employed PHQ2/ PHQ9 completed Solution-Focused Strategies employed:  Active listening / Reflection utilized  Emotional Support Provided Caregiver stress acknowledged  Assessed needs, level of care concerns, how currently meeting needs and barriers to care, Discuss community support options, Discussed private pay options, and Discussed family support and building support system       Task & activities to accomplish goals: Call your insurance provider for more information about your Enhanced Benefits  Call PACE program [phone] if no one has call you in 1 week.  I have placed the referral Follow up on possible insurance options discussed (  ) Continue with compliance of taking medication  Continue with your self-care action plan ( ) I have placed a referral to [facility] Guide - they will contact you.             Follow Up Plan: Appointment scheduled for SW follow up with client by phone on: 02/13/22  Reece Levy MSW, LCSW Licensed Clinical Social Worker [facility]   [phone]

## 2022-01-19 ENCOUNTER — Telehealth: Payer: Self-pay

## 2022-01-19 DIAGNOSIS — C180 Malignant neoplasm of cecum: Secondary | ICD-10-CM | POA: Diagnosis not present

## 2022-01-19 DIAGNOSIS — E1159 Type 2 diabetes mellitus with other circulatory complications: Secondary | ICD-10-CM | POA: Diagnosis not present

## 2022-01-19 DIAGNOSIS — E1122 Type 2 diabetes mellitus with diabetic chronic kidney disease: Secondary | ICD-10-CM | POA: Diagnosis not present

## 2022-01-19 DIAGNOSIS — D631 Anemia in chronic kidney disease: Secondary | ICD-10-CM | POA: Diagnosis not present

## 2022-01-19 DIAGNOSIS — N184 Chronic kidney disease, stage 4 (severe): Secondary | ICD-10-CM | POA: Diagnosis not present

## 2022-01-19 DIAGNOSIS — I152 Hypertension secondary to endocrine disorders: Secondary | ICD-10-CM | POA: Diagnosis not present

## 2022-01-19 NOTE — Telephone Encounter (Signed)
? ?  Telephone encounter was:  Successful.  ?01/19/2022 ?Name: Tonya [name] MRN: [id number] DOB: 06-26-1941 ? ?Tonya [name] is a 81 y.o. year old female who is a primary care patient of Bedsole, Amy E, MD . The community resource team was consulted for assistance with  in home care ? ?Care guide performed the following interventions: Patient provided with information about care guide support team and interviewed to confirm resource needs.patients daughter called back  and I will be emailing resources to her and she will call insurance about lifeline and let me know if she is eligible if not we can follow up on that ? ?Follow Up Plan:  Care guide will follow up with patient by phone over the next day ? ? ? ?Larena Sox ?Care Guide, Embedded Care Coordination ?[place], Care Management  ?[phone] ?[address], [place], [place] [address] ?Phone: [phone] ?Email: [email or web address] ? ?  ?

## 2022-01-19 NOTE — Telephone Encounter (Signed)
? ?  Telephone encounter was:  Unsuccessful.  01/19/2022 ?Name: Tonya [name] MRN: [id number] DOB: July 29, 1941 ? ?Unsuccessful outbound call made today to assist with:   in home care ? ?Outreach Attempt:  1st Attempt ? ?A HIPAA compliant voice message was left requesting a return call.  Instructed patient to call back at in home care. ? ? ? ? ?Larena Sox ?Care Guide, Embedded Care Coordination ?[facility], Care Management  ?[phone] ?[address], [place], [place] [address] ?Phone: [phone] ?Email: [email or web address] ? ?  ?

## 2022-01-21 ENCOUNTER — Telehealth: Payer: Self-pay

## 2022-01-21 NOTE — Telephone Encounter (Signed)
? ?  Telephone encounter was:  Successful.  ?01/21/2022 ?Name: HAILYN ZARR MRN: [id number] DOB: 1940/10/26 ? ?DORINA RIBAUDO is a 81 y.o. year old female who is a primary care patient of Bedsole, Amy E, MD . The community resource team was consulted for assistance with Caregiver Stress ? ?Care guide performed the following interventions: Patient provided with information about care guide support team and interviewed to confirm resource needs.Emailed resources to Patients daughter Almyra Free and she was able to open email. She has my contact information if needs more resources ? ? ?Follow Up Plan:  No further follow up planned at this time. The patient has been provided with needed resources. ? ? ? ?Larena Sox ?Care Guide, Embedded Care Coordination ?[facility], Care Management  ?[phone] ?[address], [place], [place] [address] ?Phone: [phone] ?Email: [email or web address] ? ?  ?

## 2022-01-22 DIAGNOSIS — D631 Anemia in chronic kidney disease: Secondary | ICD-10-CM | POA: Diagnosis not present

## 2022-01-22 DIAGNOSIS — N184 Chronic kidney disease, stage 4 (severe): Secondary | ICD-10-CM | POA: Diagnosis not present

## 2022-01-22 DIAGNOSIS — I152 Hypertension secondary to endocrine disorders: Secondary | ICD-10-CM | POA: Diagnosis not present

## 2022-01-22 DIAGNOSIS — E1159 Type 2 diabetes mellitus with other circulatory complications: Secondary | ICD-10-CM | POA: Diagnosis not present

## 2022-01-22 DIAGNOSIS — C180 Malignant neoplasm of cecum: Secondary | ICD-10-CM | POA: Diagnosis not present

## 2022-01-22 DIAGNOSIS — E1122 Type 2 diabetes mellitus with diabetic chronic kidney disease: Secondary | ICD-10-CM | POA: Diagnosis not present

## 2022-01-23 DIAGNOSIS — I152 Hypertension secondary to endocrine disorders: Secondary | ICD-10-CM | POA: Diagnosis not present

## 2022-01-23 DIAGNOSIS — E1122 Type 2 diabetes mellitus with diabetic chronic kidney disease: Secondary | ICD-10-CM | POA: Diagnosis not present

## 2022-01-23 DIAGNOSIS — E1159 Type 2 diabetes mellitus with other circulatory complications: Secondary | ICD-10-CM | POA: Diagnosis not present

## 2022-01-23 DIAGNOSIS — C180 Malignant neoplasm of cecum: Secondary | ICD-10-CM | POA: Diagnosis not present

## 2022-01-23 DIAGNOSIS — D631 Anemia in chronic kidney disease: Secondary | ICD-10-CM | POA: Diagnosis not present

## 2022-01-23 DIAGNOSIS — N184 Chronic kidney disease, stage 4 (severe): Secondary | ICD-10-CM | POA: Diagnosis not present

## 2022-01-26 DIAGNOSIS — E1122 Type 2 diabetes mellitus with diabetic chronic kidney disease: Secondary | ICD-10-CM | POA: Diagnosis not present

## 2022-01-26 DIAGNOSIS — I152 Hypertension secondary to endocrine disorders: Secondary | ICD-10-CM | POA: Diagnosis not present

## 2022-01-26 DIAGNOSIS — D631 Anemia in chronic kidney disease: Secondary | ICD-10-CM | POA: Diagnosis not present

## 2022-01-26 DIAGNOSIS — E1159 Type 2 diabetes mellitus with other circulatory complications: Secondary | ICD-10-CM | POA: Diagnosis not present

## 2022-01-26 DIAGNOSIS — N184 Chronic kidney disease, stage 4 (severe): Secondary | ICD-10-CM | POA: Diagnosis not present

## 2022-01-26 DIAGNOSIS — C180 Malignant neoplasm of cecum: Secondary | ICD-10-CM | POA: Diagnosis not present

## 2022-01-28 ENCOUNTER — Encounter: Payer: Self-pay | Admitting: Gastroenterology

## 2022-01-28 ENCOUNTER — Ambulatory Visit: Payer: Medicare Other | Admitting: Gastroenterology

## 2022-01-28 VITALS — BP 98/60 | HR 98 | Ht 65.0 in | Wt 157.0 lb

## 2022-01-28 DIAGNOSIS — E1122 Type 2 diabetes mellitus with diabetic chronic kidney disease: Secondary | ICD-10-CM | POA: Diagnosis not present

## 2022-01-28 DIAGNOSIS — N184 Chronic kidney disease, stage 4 (severe): Secondary | ICD-10-CM | POA: Diagnosis not present

## 2022-01-28 DIAGNOSIS — R197 Diarrhea, unspecified: Secondary | ICD-10-CM

## 2022-01-28 DIAGNOSIS — E1159 Type 2 diabetes mellitus with other circulatory complications: Secondary | ICD-10-CM | POA: Diagnosis not present

## 2022-01-28 DIAGNOSIS — D631 Anemia in chronic kidney disease: Secondary | ICD-10-CM | POA: Diagnosis not present

## 2022-01-28 DIAGNOSIS — C180 Malignant neoplasm of cecum: Secondary | ICD-10-CM | POA: Diagnosis not present

## 2022-01-28 DIAGNOSIS — I152 Hypertension secondary to endocrine disorders: Secondary | ICD-10-CM | POA: Diagnosis not present

## 2022-01-28 DIAGNOSIS — R7989 Other specified abnormal findings of blood chemistry: Secondary | ICD-10-CM | POA: Diagnosis not present

## 2022-01-28 NOTE — Progress Notes (Signed)
Review of pertinent gastrointestinal problems: ?1. Colon cancer: early stage synchronous right-sided colon cancers diagnosed and resected in March 2010. She did not require any adjuvant chemotherapy. She has had serial interval follow-up and last colonoscopy was done in March 2014. She had 3 sessile polyps removed which were tubular adenomas.  Colonoscopy October 2019 6 sessile polyps were removed.  2 of them were greater than 1 cm.  These were all tubular adenomas.  ?2. Chronic loose stools since cancer surgery; responded well to cholestyramine ?3.  Acute hospital admission 11/2021 related to nitrofurantoin started for UTI.  Transient elevated liver tests and significant diarrhea, anorexia and malaise all improved when she stopped that medicine.  It has been added to her "allergy list" ? ? ?HPI: ?This is a very pleasant 81 year old woman who is here with her daughter today ? ? ?I last saw her here in the office about 4 months ago to discuss colon cancer screening, polyp surveillance.  She was with her daughter and the 3 of [place] decided that she would not undergo further surveillance colonoscopies. ? ?She presented to the emergency room about 2 months after that visit with anorexia, malaise, jaundice.  She underwent extensive testing including ultrasound of the abdomen, 2 CT scans of the abdomen pelvis, 1 MRI, CT scan of the head, imaging of the bony structures with plain films.  These imaging studies showed CBD was 50mm without biliary filling defects, biliary masses.  Her total bilirubin was as high as 3.2 her transaminases in the 1-200 range.  Acute viral hepatitis panel was negative, also CMV was negative. Her LFTs improved to completely normal without intervention.  She was evaluated by our inpatient rounding team.   ? ?She had signigicant loose stools that was also worked up with stool testing.  Her bowel issues completely resolved. ? ?Interestingly she had been on nitrofurantoin shortly prior to her admission.   This is possibly the cause of her diarrhea and also possibly the cause of her transiently elevated LFTs. ? ?Blood work for 2023 LFTs all completely normal, CBC also completely normal. ? ?Today she is here with her daughter.  She feels really pretty well.  Her daughter tells me that her bowels have reverted to being a bit constipated and she is adjusting her cholestyramine accordingly. ? ? ?ROS: complete GI ROS as described in HPI, all other review negative. ? ?Constitutional:  No unintentional weight loss ? ? ?Past Medical History:  ?Diagnosis Date  ? Adenocarcinoma, colon ([place])   ? Anemia   ? iron deficiency  ? Anxiety   ? Carpal tunnel syndrome   ? Cataract   ? REMOVED BILATERAL  ? Chronic diarrhea   ? Colon polyps   ? Tubular adenoma  ? Degenerative disk disease   ? Depression   ? Diabetes mellitus, type 2 ([place])   ? Dysrhythmia   ? h/o AFIB  ? GERD (gastroesophageal reflux disease)   ? History of blood transfusion   ? After surgeries  ? Hyperlipidemia   ? Hypertension   ? IBS (irritable bowel syndrome)   ? Kidney disorder   ? Osteoarthritis   ? Osteoporosis   ? PONV (postoperative nausea and vomiting)   ? S/P coronary artery stent placement 2007  ? ? ?Past Surgical History:  ?Procedure Laterality Date  ? CARDIAC CATHETERIZATION  12/10  ? LAD stent patent. Insignificant CAD, otherwise EF 60-65%  ? CARPAL TUNNEL RELEASE    ? CATARACT EXTRACTION  03/2015  ? CHOLECYSTECTOMY    ?  COLON SURGERY    ? COLONOSCOPY    ? DILATION AND CURETTAGE OF UTERUS    ? HEMATOMA EVACUATION    ? after knee replacement  ? KNEE ARTHROSCOPY    ? bilateral  ? LUMBAR LAMINECTOMY N/A 11/26/2015  ? Procedure: LUMBAR DECOMPRESSIVE LAMINECTOMY L3-4 AND L4-5, LEFT L2-3 HEMILAMINECTOMY;  Surgeon: Jessy Oto, MD;  Location: [facility];  Service: Orthopedics;  Laterality: N/A;  ? ORIF ELBOW FRACTURE Left 03/18/2021  ? Procedure: OPEN REDUCTION INTERNAL FIXATION (ORIF) ELBOW/OLECRANON FRACTURE;  Surgeon: Renette Butters, MD;  Location: [facility];   Service: Orthopedics;  Laterality: Left;  ? PARTIAL COLECTOMY  11/2008  ? right, for adenocarcinoma  ? REPLACEMENT TOTAL KNEE  04/2010  ? right, complicated by hemarthrosis   ? REVERSE SHOULDER ARTHROPLASTY Right 05/28/2016  ? REVERSE SHOULDER ARTHROPLASTY Right 05/28/2016  ? Procedure: REVERSE SHOULDER ARTHROPLASTY;  Surgeon: Meredith Pel, MD;  Location: [facility];  Service: Orthopedics;  Laterality: Right;  ? TONSILLECTOMY    ? TOTAL KNEE ARTHROPLASTY Left 04/03/2014  ? dr whitfield  ? TOTAL KNEE ARTHROPLASTY Left 04/03/2014  ? Procedure: TOTAL KNEE ARTHROPLASTY;  Surgeon: Garald Balding, MD;  Location: [facility];  Service: Orthopedics;  Laterality: Left;  ? TOTAL KNEE REVISION  03/08/2012  ? Procedure: TOTAL KNEE REVISION;  Surgeon: Garald Balding, MD;  Location: [facility];  Service: Orthopedics;  Laterality: Right;  removal total knee hardware and placement of antibiotic cement spacer and antibiotic beads  ? TOTAL KNEE REVISION  05/17/2012  ? Procedure: TOTAL KNEE REVISION;  Surgeon: Garald Balding, MD;  Location: [facility];  Service: Orthopedics;  Laterality: Right;  right total knee revision, removal of antibiotic spacer  ? ? ?Current Outpatient Medications  ?Medication Instructions  ? atorvastatin (LIPITOR) 20 MG tablet TAKE 1 TABLET BY MOUTH EVERY DAY  ? Blood Glucose Monitoring Suppl (ACCU-CHEK AVIVA PLUS) w/Device KIT Use to check blood once sugar daily.  ? Calcium Carbonate-Vit D-Min (CALCIUM 1200 PO) Oral, 1 daily  ? carvedilol (COREG) 12.5 MG tablet TAKE 1 TABLET (12.$RemoveBefo'5MG'iyYSKxGWOiI$  TOTAL) BY MOUTH TWICE A DAY WITH MEALS  ? cetirizine (ZYRTEC) 10 mg, Oral, Daily at bedtime  ? cholestyramine light (PREVALITE) 4 g packet USE 1 PACKET TWICE DAILY WITH A MEAL, AT LEAST 2 HOURS AWAY FROM OTHER MEDS  Patient needs an appointment for further refills.  ? citalopram (CELEXA) 40 MG tablet TAKE 1 TABLET BY MOUTH EVERY DAY  ? diltiazem (CARDIZEM CD) 180 MG 24 hr capsule TAKE 1 CAPSULE BY MOUTH EVERY DAY  ? ELIQUIS 2.5 MG TABS tablet  TAKE 1 TABLET BY MOUTH TWICE A DAY  ? ferrous sulfate 325 mg, Oral, 2 times daily  ? glucose blood (ACCU-CHEK AVIVA PLUS) test strip Use to check blood sugar daily.  ? isosorbide mononitrate (IMDUR) 30 MG 24 hr tablet TAKE 1 TABLET BY MOUTH EVERY DAY  ? losartan (COZAAR) 100 mg, Oral, Daily  ? Magnesium Gluconate (MAGNESIUM 27 PO) Oral, 1 daily  ? omeprazole (PRILOSEC) 20 MG capsule TAKE 1 CAPSULE BY MOUTH EVERY DAY  ? ondansetron (ZOFRAN-ODT) 4 mg, Oral, Every 8 hours PRN  ? Vitamin D (Ergocalciferol) (DRISDOL) 50,000 Units, Oral, Every 7 days  ? ? ?Allergies as of 01/28/2022 - Review Complete 01/28/2022  ?Allergen Reaction Noted  ? Amoxicillin-pot clavulanate Swelling and Other (See Comments)   ? Nitrofurantoin Diarrhea 12/29/2021  ? ? ?Family History  ?Problem Relation Age of Onset  ? Heart disease Mother   ?  massive MI age 15  ? Heart attack Mother   ? Heart disease Father   ?     Massive MI age 20  ? Prostate cancer Father   ? Colon polyps Sister   ? Diabetes Paternal Grandmother   ? Colon cancer Neg Hx   ? Esophageal cancer Neg Hx   ? Pancreatic cancer Neg Hx   ? Stomach cancer Neg Hx   ? Liver disease Neg Hx   ? ? ?Social History  ? ?Socioeconomic History  ? Marital status: Divorced  ?  Spouse name: Not on file  ? Number of children: 3  ? Years of education: Not on file  ? Highest education level: Not on file  ?Occupational History  ? Occupation: retired  ?  Employer: RETIRED  ?Tobacco Use  ? Smoking status: Never  ? Smokeless tobacco: Never  ?Vaping Use  ? Vaping Use: Never used  ?Substance and Sexual Activity  ? Alcohol use: No  ?  Alcohol/week: 0.0 standard drinks  ? Drug use: No  ? Sexual activity: Never  ?Other Topics Concern  ? Not on file  ?Social History Narrative  ? No regular exercise, limited due to knees  ? Diet- addicted to sweets, some fruit and veggies, water daily.  ? ?Social Determinants of Health  ? ?Financial Resource Strain: Low Risk   ? Difficulty of Paying Living Expenses: Not hard  at all  ?Food Insecurity: No Food Insecurity  ? Worried About Charity fundraiser in the Last Year: Never true  ? Ran Out of Food in the Last Year: Never true  ?Transportation Needs: No Transportation Needs  ?

## 2022-01-28 NOTE — Patient Instructions (Signed)
If you are age 82 or older, your body mass index should be between 23-30. Your Body mass index is 26.13 kg/m?Marland Kitchen If this is out of the aforementioned range listed, please consider follow up with your Primary Care Provider. ?________________________________________________________ ? ?The [facility] providers would like to encourage you to use [facility] to communicate with providers for non-urgent requests or questions.  Due to long hold times on the telephone, sending your provider a message by [facility] may be a faster and more efficient way to get a response.  Please allow 48 business hours for a response.  Please remember that this is for non-urgent requests.  ?_______________________________________________________ ? ?You may titrate prevalite (cholestyramine) as needed. ? ?You will follow up in our office on an as needed basis. ? ?Thank you for entrusting me with your care and choosing [facility]. ? ?Dr Ardis Hughs ? ?

## 2022-01-29 ENCOUNTER — Ambulatory Visit (INDEPENDENT_AMBULATORY_CARE_PROVIDER_SITE_OTHER): Payer: Medicare Other | Admitting: Surgery

## 2022-01-29 ENCOUNTER — Other Ambulatory Visit: Payer: Self-pay

## 2022-01-29 ENCOUNTER — Encounter: Payer: Self-pay | Admitting: Surgery

## 2022-01-29 VITALS — BP 126/80 | HR 68 | Temp 98.0°F | Ht 65.0 in | Wt 156.0 lb

## 2022-01-29 DIAGNOSIS — M7981 Nontraumatic hematoma of soft tissue: Secondary | ICD-10-CM | POA: Diagnosis not present

## 2022-01-29 DIAGNOSIS — C180 Malignant neoplasm of cecum: Secondary | ICD-10-CM | POA: Diagnosis not present

## 2022-01-29 DIAGNOSIS — E1122 Type 2 diabetes mellitus with diabetic chronic kidney disease: Secondary | ICD-10-CM | POA: Diagnosis not present

## 2022-01-29 DIAGNOSIS — I152 Hypertension secondary to endocrine disorders: Secondary | ICD-10-CM | POA: Diagnosis not present

## 2022-01-29 DIAGNOSIS — N184 Chronic kidney disease, stage 4 (severe): Secondary | ICD-10-CM | POA: Diagnosis not present

## 2022-01-29 DIAGNOSIS — E1159 Type 2 diabetes mellitus with other circulatory complications: Secondary | ICD-10-CM | POA: Diagnosis not present

## 2022-01-29 DIAGNOSIS — D631 Anemia in chronic kidney disease: Secondary | ICD-10-CM | POA: Diagnosis not present

## 2022-01-29 DIAGNOSIS — T148XXA Other injury of unspecified body region, initial encounter: Secondary | ICD-10-CM

## 2022-01-29 NOTE — Progress Notes (Signed)
Follow-up after aspiration of large right lateral thigh/hip hematoma.  Pain appears to be well controlled, there is no evidence of infection.  On examination the mass has smaller than on previous exam, it continues to be somewhat firm consistent with a jellylike consistency of likely clotted blood.  I do not believe repeat aspiration will be helpful at this time. ?We discussed that patients is needed to allow the body to reabsorb what is present, or to continue to liquefy the blood that is there for another aspiration attempt. ?We all agreed to watch this for another month and if not seeing progress to follow-up for reevaluation and reconsideration.  This was in full agreement with patient and daughter present in the room. ?

## 2022-01-29 NOTE — Patient Instructions (Signed)
Please see your appointment listed below. 

## 2022-01-30 ENCOUNTER — Other Ambulatory Visit: Payer: Self-pay | Admitting: Cardiology

## 2022-01-30 DIAGNOSIS — C180 Malignant neoplasm of cecum: Secondary | ICD-10-CM | POA: Diagnosis not present

## 2022-01-30 DIAGNOSIS — M810 Age-related osteoporosis without current pathological fracture: Secondary | ICD-10-CM

## 2022-01-30 DIAGNOSIS — I480 Paroxysmal atrial fibrillation: Secondary | ICD-10-CM

## 2022-01-30 DIAGNOSIS — I700 Atherosclerosis of aorta: Secondary | ICD-10-CM

## 2022-01-30 DIAGNOSIS — K219 Gastro-esophageal reflux disease without esophagitis: Secondary | ICD-10-CM

## 2022-01-30 DIAGNOSIS — D631 Anemia in chronic kidney disease: Secondary | ICD-10-CM | POA: Diagnosis not present

## 2022-01-30 DIAGNOSIS — F331 Major depressive disorder, recurrent, moderate: Secondary | ICD-10-CM

## 2022-01-30 DIAGNOSIS — E1159 Type 2 diabetes mellitus with other circulatory complications: Secondary | ICD-10-CM | POA: Diagnosis not present

## 2022-01-30 DIAGNOSIS — F0153 Vascular dementia, unspecified severity, with mood disturbance: Secondary | ICD-10-CM | POA: Diagnosis not present

## 2022-01-30 DIAGNOSIS — F411 Generalized anxiety disorder: Secondary | ICD-10-CM

## 2022-01-30 DIAGNOSIS — E860 Dehydration: Secondary | ICD-10-CM | POA: Diagnosis not present

## 2022-01-30 DIAGNOSIS — E785 Hyperlipidemia, unspecified: Secondary | ICD-10-CM

## 2022-01-30 DIAGNOSIS — Z9181 History of falling: Secondary | ICD-10-CM

## 2022-01-30 DIAGNOSIS — E1169 Type 2 diabetes mellitus with other specified complication: Secondary | ICD-10-CM

## 2022-01-30 DIAGNOSIS — N184 Chronic kidney disease, stage 4 (severe): Secondary | ICD-10-CM | POA: Diagnosis not present

## 2022-01-30 DIAGNOSIS — D509 Iron deficiency anemia, unspecified: Secondary | ICD-10-CM

## 2022-01-30 DIAGNOSIS — E440 Moderate protein-calorie malnutrition: Secondary | ICD-10-CM | POA: Diagnosis not present

## 2022-01-30 DIAGNOSIS — I672 Cerebral atherosclerosis: Secondary | ICD-10-CM | POA: Diagnosis not present

## 2022-01-30 DIAGNOSIS — Z7901 Long term (current) use of anticoagulants: Secondary | ICD-10-CM

## 2022-01-30 DIAGNOSIS — I152 Hypertension secondary to endocrine disorders: Secondary | ICD-10-CM | POA: Diagnosis not present

## 2022-01-30 DIAGNOSIS — I2510 Atherosclerotic heart disease of native coronary artery without angina pectoris: Secondary | ICD-10-CM

## 2022-01-30 DIAGNOSIS — E871 Hypo-osmolality and hyponatremia: Secondary | ICD-10-CM

## 2022-01-30 DIAGNOSIS — F0154 Vascular dementia, unspecified severity, with anxiety: Secondary | ICD-10-CM | POA: Diagnosis not present

## 2022-01-30 DIAGNOSIS — E1122 Type 2 diabetes mellitus with diabetic chronic kidney disease: Secondary | ICD-10-CM | POA: Diagnosis not present

## 2022-01-31 ENCOUNTER — Other Ambulatory Visit: Payer: Self-pay | Admitting: Family Medicine

## 2022-02-01 NOTE — Telephone Encounter (Signed)
Last office visit 01/06/22 for hosptial follow up.  Last refilled 11/04/21 for #12 with no refills.  Last Vit D level 03/28/21 which was normal at 31.15 ng/ml.  Next Appt 03/24/22 for DM.  ?

## 2022-02-02 ENCOUNTER — Encounter: Payer: Self-pay | Admitting: Family Medicine

## 2022-02-02 ENCOUNTER — Ambulatory Visit
Admission: RE | Admit: 2022-02-02 | Discharge: 2022-02-02 | Disposition: A | Discharge status: Home / Self Care | Payer: Medicare Other | Source: Ambulatory Visit | Attending: Family Medicine | Admitting: Family Medicine

## 2022-02-02 DIAGNOSIS — M85852 Other specified disorders of bone density and structure, left thigh: Secondary | ICD-10-CM | POA: Diagnosis not present

## 2022-02-02 DIAGNOSIS — C180 Malignant neoplasm of cecum: Secondary | ICD-10-CM | POA: Diagnosis not present

## 2022-02-02 DIAGNOSIS — N184 Chronic kidney disease, stage 4 (severe): Secondary | ICD-10-CM | POA: Diagnosis not present

## 2022-02-02 DIAGNOSIS — Z1231 Encounter for screening mammogram for malignant neoplasm of breast: Secondary | ICD-10-CM | POA: Diagnosis not present

## 2022-02-02 DIAGNOSIS — E1122 Type 2 diabetes mellitus with diabetic chronic kidney disease: Secondary | ICD-10-CM | POA: Diagnosis not present

## 2022-02-02 DIAGNOSIS — I152 Hypertension secondary to endocrine disorders: Secondary | ICD-10-CM | POA: Diagnosis not present

## 2022-02-02 DIAGNOSIS — D631 Anemia in chronic kidney disease: Secondary | ICD-10-CM | POA: Diagnosis not present

## 2022-02-02 DIAGNOSIS — M810 Age-related osteoporosis without current pathological fracture: Secondary | ICD-10-CM | POA: Diagnosis not present

## 2022-02-02 DIAGNOSIS — Z780 Asymptomatic menopausal state: Secondary | ICD-10-CM | POA: Diagnosis not present

## 2022-02-02 DIAGNOSIS — E2839 Other primary ovarian failure: Secondary | ICD-10-CM

## 2022-02-02 DIAGNOSIS — E1159 Type 2 diabetes mellitus with other circulatory complications: Secondary | ICD-10-CM | POA: Diagnosis not present

## 2022-02-02 NOTE — Assessment & Plan Note (Signed)
Acute, now resolved. ? Patient back to normal po intake. ?

## 2022-02-02 NOTE — Assessment & Plan Note (Signed)
mild, improving.  MRI/MRCP unremarkable.  Hepatitis panel negative ?Likely due to nitrofurantoin. ?

## 2022-02-02 NOTE — Assessment & Plan Note (Signed)
Acute, stable ? Call with update if increaisng pain, redness or increase in size. ?

## 2022-02-02 NOTE — Assessment & Plan Note (Signed)
Chronic, needs assisrtance with home care. Referral to care management given. ?

## 2022-02-02 NOTE — Assessment & Plan Note (Signed)
Hypotension now resolved with resolution of diarrhea and back to normal po intake. ?

## 2022-02-05 DIAGNOSIS — N184 Chronic kidney disease, stage 4 (severe): Secondary | ICD-10-CM | POA: Diagnosis not present

## 2022-02-05 DIAGNOSIS — C180 Malignant neoplasm of cecum: Secondary | ICD-10-CM | POA: Diagnosis not present

## 2022-02-05 DIAGNOSIS — I152 Hypertension secondary to endocrine disorders: Secondary | ICD-10-CM | POA: Diagnosis not present

## 2022-02-05 DIAGNOSIS — D631 Anemia in chronic kidney disease: Secondary | ICD-10-CM | POA: Diagnosis not present

## 2022-02-05 DIAGNOSIS — E1122 Type 2 diabetes mellitus with diabetic chronic kidney disease: Secondary | ICD-10-CM | POA: Diagnosis not present

## 2022-02-05 DIAGNOSIS — E1159 Type 2 diabetes mellitus with other circulatory complications: Secondary | ICD-10-CM | POA: Diagnosis not present

## 2022-02-10 DIAGNOSIS — I152 Hypertension secondary to endocrine disorders: Secondary | ICD-10-CM | POA: Diagnosis not present

## 2022-02-10 DIAGNOSIS — E1159 Type 2 diabetes mellitus with other circulatory complications: Secondary | ICD-10-CM | POA: Diagnosis not present

## 2022-02-10 DIAGNOSIS — D631 Anemia in chronic kidney disease: Secondary | ICD-10-CM | POA: Diagnosis not present

## 2022-02-10 DIAGNOSIS — C180 Malignant neoplasm of cecum: Secondary | ICD-10-CM | POA: Diagnosis not present

## 2022-02-10 DIAGNOSIS — E1122 Type 2 diabetes mellitus with diabetic chronic kidney disease: Secondary | ICD-10-CM | POA: Diagnosis not present

## 2022-02-10 DIAGNOSIS — N184 Chronic kidney disease, stage 4 (severe): Secondary | ICD-10-CM | POA: Diagnosis not present

## 2022-02-11 ENCOUNTER — Telehealth: Payer: Self-pay | Admitting: Family Medicine

## 2022-02-11 NOTE — Telephone Encounter (Signed)
[employer] certification  Charge Entry ? ?Home Health Agency: [facility] ? ?Order# [id number] ? ?SOC Date: 4.4.23 ? ?Doctor: Diona Browner ? ?Date signed:  5.5.23 ? ?Date charges entered: 5.17.23 ? ?Forms faxed  to [facility] on: 5.10.23 ? ?Faxing to be [facility] Fax to scan into patient's media tab ? ? ?  ?

## 2022-02-13 ENCOUNTER — Telehealth: Payer: Medicare Other

## 2022-02-16 ENCOUNTER — Telehealth: Payer: Self-pay | Admitting: *Deleted

## 2022-02-16 DIAGNOSIS — E1122 Type 2 diabetes mellitus with diabetic chronic kidney disease: Secondary | ICD-10-CM | POA: Diagnosis not present

## 2022-02-16 DIAGNOSIS — N184 Chronic kidney disease, stage 4 (severe): Secondary | ICD-10-CM | POA: Diagnosis not present

## 2022-02-16 DIAGNOSIS — I152 Hypertension secondary to endocrine disorders: Secondary | ICD-10-CM | POA: Diagnosis not present

## 2022-02-16 DIAGNOSIS — E1159 Type 2 diabetes mellitus with other circulatory complications: Secondary | ICD-10-CM | POA: Diagnosis not present

## 2022-02-16 DIAGNOSIS — D631 Anemia in chronic kidney disease: Secondary | ICD-10-CM | POA: Diagnosis not present

## 2022-02-16 DIAGNOSIS — C180 Malignant neoplasm of cecum: Secondary | ICD-10-CM | POA: Diagnosis not present

## 2022-02-16 NOTE — Telephone Encounter (Signed)
  Care Management   Follow Up Note   02/16/2022 Name: Tonya [name] MRN: [id number] DOB: 28-Dec-1940   Referred by: [name], MD Reason for referral : No chief complaint on file.   An unsuccessful telephone outreach was attempted today. The patient was referred to the case management team for assistance with care management and care coordination.   Follow Up Plan: The care management team will reach out to the patient again over the next 10 days.  Eduard Clos MSW, LCSW Licensed Clinical Social Worker [place]   [phone]

## 2022-02-17 ENCOUNTER — Telehealth: Payer: Self-pay | Admitting: *Deleted

## 2022-02-17 DIAGNOSIS — E1122 Type 2 diabetes mellitus with diabetic chronic kidney disease: Secondary | ICD-10-CM | POA: Diagnosis not present

## 2022-02-17 DIAGNOSIS — I152 Hypertension secondary to endocrine disorders: Secondary | ICD-10-CM | POA: Diagnosis not present

## 2022-02-17 DIAGNOSIS — E1159 Type 2 diabetes mellitus with other circulatory complications: Secondary | ICD-10-CM | POA: Diagnosis not present

## 2022-02-17 DIAGNOSIS — D631 Anemia in chronic kidney disease: Secondary | ICD-10-CM | POA: Diagnosis not present

## 2022-02-17 DIAGNOSIS — N184 Chronic kidney disease, stage 4 (severe): Secondary | ICD-10-CM | POA: Diagnosis not present

## 2022-02-17 DIAGNOSIS — C180 Malignant neoplasm of cecum: Secondary | ICD-10-CM | POA: Diagnosis not present

## 2022-02-17 NOTE — Telephone Encounter (Signed)
CSW was advised that pt's daughter was appreciative of our help and says things are stable and declined to reschedule at this time.  CSW will sign off.   Eduard Clos MSW, LCSW Licensed Clinical Social Worker [facility]   [phone]

## 2022-02-17 NOTE — Chronic Care Management (AMB) (Signed)
  Chronic Care Management Note  02/17/2022 Name: Tonya [name] MRN: [id number] DOB: [birth year]  Tonya [name] is a 81 y.o. year old female who is a primary care patient of Diona Browner, Amy E, MD and is actively engaged with the care management team. I reached out to [name] by phone today to assist with re-scheduling a follow up visit with the Licensed Clinical Social Worker  Follow up plan: Patient daughter declines further follow up and engagement by the care management team. Appropriate care team members and provider have been notified via electronic communication.   Julian Hy, [facility] Management  Direct Dial: [phone]

## 2022-03-03 ENCOUNTER — Other Ambulatory Visit: Payer: Self-pay

## 2022-03-03 ENCOUNTER — Ambulatory Visit: Payer: Medicare Other | Admitting: Surgery

## 2022-03-03 ENCOUNTER — Encounter: Payer: Self-pay | Admitting: Surgery

## 2022-03-03 ENCOUNTER — Telehealth: Payer: Self-pay | Admitting: Family Medicine

## 2022-03-03 VITALS — BP 130/83 | HR 109 | Temp 98.3°F | Ht 65.0 in | Wt 156.0 lb

## 2022-03-03 DIAGNOSIS — E1121 Type 2 diabetes mellitus with diabetic nephropathy: Secondary | ICD-10-CM

## 2022-03-03 DIAGNOSIS — M7981 Nontraumatic hematoma of soft tissue: Secondary | ICD-10-CM

## 2022-03-03 DIAGNOSIS — T148XXA Other injury of unspecified body region, initial encounter: Secondary | ICD-10-CM

## 2022-03-03 NOTE — Patient Instructions (Signed)
Please call the office if you have any questions or concerns. 

## 2022-03-03 NOTE — Telephone Encounter (Signed)
-----   Message from Ellamae Sia sent at 03/02/2022 12:21 PM EDT ----- Regarding: Lab orders for Tuesday, 6.20.23 Lab orders for dm f/u

## 2022-03-03 NOTE — Progress Notes (Signed)
Follow-up for large right lateral thigh/hip hematoma.  Pain appears to be well controlled, there is no evidence of infection.  This diminished significantly in size, however remains somewhat tender on direct pressure.  On examination the mass has smaller than on previous exam, it continues to be more firm.   We attempted aspiration with ChloraPrep, and local anesthetic of 1% lidocaine with epi.  Despite an 18-gauge needle I was only able to aspirate 2 to 3 mL of a bloody fluid.  Otherwise it seems to be somewhat septated and solid in nature.  We all agreed to not attempt any further procedures at this time, allowing continued progression in her healing.  We will be glad to see her again if there are any unanticipated complications.  This was in full agreement with patient and daughter present in the room.

## 2022-03-11 ENCOUNTER — Telehealth: Payer: Self-pay

## 2022-03-11 NOTE — Chronic Care Management (AMB) (Signed)
    Chronic Care Management Pharmacy Assistant   Name: Tonya [name]  MRN: [id number] DOB: 28-May-1941   Reason for Encounter: Reminder Call   Conditions to be addressed/monitored: CAD, HTN, HLD, DMII, and CKD Stage 4   Medications: Outpatient Encounter Medications as of 03/11/2022  Medication Sig   atorvastatin (LIPITOR) 20 MG tablet TAKE 1 TABLET BY MOUTH EVERY DAY   Blood Glucose Monitoring Suppl (ACCU-CHEK AVIVA PLUS) w/Device KIT Use to check blood once sugar daily.   Calcium Carbonate-Vit D-Min (CALCIUM 1200 PO) Take by mouth. 1 daily   carvedilol (COREG) 12.5 MG tablet TAKE 1 TABLET (12.5MG  TOTAL) BY MOUTH TWICE A DAY WITH MEALS (Patient taking differently: Take 12.5 mg by mouth 2 (two) times daily with a meal.)   cetirizine (ZYRTEC) 10 MG tablet Take 10 mg by mouth at bedtime.   cholestyramine light (PREVALITE) 4 g packet USE 1 PACKET TWICE DAILY WITH A MEAL, AT LEAST 2 HOURS AWAY FROM OTHER MEDS  Patient needs an appointment for further refills. (Patient taking differently: Take 4 g by mouth daily.)   citalopram (CELEXA) 40 MG tablet TAKE 1 TABLET BY MOUTH EVERY DAY (Patient taking differently: Take 40 mg by mouth daily.)   diltiazem (CARDIZEM CD) 180 MG 24 hr capsule TAKE 1 CAPSULE BY MOUTH EVERY DAY (Patient taking differently: Take 180 mg by mouth daily.)   ELIQUIS 2.5 MG TABS tablet TAKE 1 TABLET BY MOUTH TWICE A DAY (Patient taking differently: Take 2.5 mg by mouth 2 (two) times daily.)   ferrous sulfate 325 (65 FE) MG EC tablet Take 325 mg by mouth 2 (two) times daily.   glucose blood (ACCU-CHEK AVIVA PLUS) test strip Use to check blood sugar daily.   isosorbide mononitrate (IMDUR) 30 MG 24 hr tablet TAKE 1 TABLET BY MOUTH EVERY DAY (Patient taking differently: Take 30 mg by mouth daily.)   losartan (COZAAR) 100 MG tablet Take 1 tablet (100 mg total) by mouth daily.   Magnesium Gluconate (MAGNESIUM 27 PO) Take by mouth. 1 daily   omeprazole (PRILOSEC) 20 MG capsule TAKE 1  CAPSULE BY MOUTH EVERY DAY (Patient taking differently: Take 20 mg by mouth daily.)   ondansetron (ZOFRAN-ODT) 4 MG disintegrating tablet Take 1 tablet (4 mg total) by mouth every 8 (eight) hours as needed.   Vitamin D, Ergocalciferol, (DRISDOL) 1.25 MG (50000 UNIT) CAPS capsule TAKE 1 CAPSULE (50,000 UNITS TOTAL) BY MOUTH EVERY 7 (SEVEN) DAYS   No facility-administered encounter medications on file as of 03/11/2022.    Tonya [name] was contacted to remind of upcoming telephone visit with Charlene Brooke on 03/16/22 at 3:45pm. Patient was reminded to have any blood glucose and blood pressure readings available for review at appointment.   Patient confirmed appointment. Spoke with daughter Tonya [name].   Are you having any problems with your medications? No   Do you have any concerns you like to discuss with the pharmacist? No   CCM referral has been placed prior to visit?  Yes    Star Rating Drugs: Medication:  Last Fill: Day Supply Atorvastatin 20mg  01/30/22  90 Losartan 100mg  01/09/22 [place], CPP notified  Avel Sensor, [place]  [phone]

## 2022-03-16 ENCOUNTER — Telehealth: Payer: Medicare Other

## 2022-03-16 ENCOUNTER — Telehealth: Payer: Self-pay | Admitting: Pharmacist

## 2022-03-16 NOTE — Progress Notes (Deleted)
Chronic Care Management Pharmacy Note  03/16/2022 Name:  Tonya [name] MRN:  [id number] DOB:  09-15-1941  Summary: ***  Recommendations/Changes made from today's visit: ***  Plan: ***   Subjective: Tonya [name] is an 81 y.o. year old female who is a primary patient of Bedsole, Amy E, MD.  The CCM team was consulted for assistance with disease management and care coordination needs.    {CCMTELEPHONEFACETOFACE:21091510} for {CCMINITIALFOLLOWUPCHOICE:21091511} in response to provider referral for pharmacy case management and/or care coordination services.   Consent to Services:  {CCMCONSENTOPTIONS:25074}  Patient Care Team: [name], MD as PCP - General Minus Breeding, MD as PCP - Cardiology (Cardiology) [name], [facility] as Pharmacist (Pharmacist)  Recent office visits: 01/06/22 Dr Diona Browner OV: hospital f/u - elevated LFT. Referred to CCM for home care assistance. LFT wnl, anemia resolved, PLT wnl. GFR decreased.  Recent consult visits: 01/28/22 Dr Ardis Hughs (GI): abnormal LFT eval - likely 2/2 nitrofurantoin, added to allergy list. F/u prn.  01/15/22 Dr Christian Mate (gen surg): new patient - hematoma eval. Aspiration at f/u..  12/11/21 Dr Candiss Norse (Nephrology): f/u CKD. 11/27/21 PA Caron Presume (Cardiology): f/u CAD.  Hospital visits: 12/25/21 - 12/29/21 Admission for Jaundice, Elevated LFTs   Objective:  Lab Results  Component Value Date   CREATININE 1.39 (H) 01/06/2022   BUN 28 (H) 01/06/2022   GFR 35.52 (L) 01/06/2022   GFRNONAA 42 (L) 12/29/2021   GFRAA 30 (L) 11/15/2017   NA 141 01/06/2022   K 4.5 01/06/2022   CALCIUM 9.7 01/06/2022   CO2 28 01/06/2022   GLUCOSE 113 (H) 01/06/2022    Lab Results  Component Value Date/Time   HGBA1C 6.3 (H) 12/26/2021 06:32 AM   HGBA1C 6.6 (H) 09/05/2021 10:33 AM   GFR 35.52 (L) 01/06/2022 09:13 AM   GFR 31.00 (L) 09/05/2021 10:33 AM   MICROALBUR 2.8 (H) 02/19/2011 10:44 AM   MICROALBUR 4.2 (H) 07/09/2010 08:26 AM     Last diabetic Eye exam:  Lab Results  Component Value Date/Time   HMDIABEYEEXA No Retinopathy 09/12/2020 12:00 AM    Last diabetic Foot exam:  Lab Results  Component Value Date/Time   HMDIABFOOTEX done 01/09/2021 12:00 AM     Lab Results  Component Value Date   CHOL 110 09/05/2021   HDL 42.30 09/05/2021   LDLCALC 35 09/05/2021   LDLDIRECT 50.0 10/22/2014   TRIG 165.0 (H) 09/05/2021   CHOLHDL 3 09/05/2021       Latest Ref Rng & Units 01/06/2022    9:13 AM 12/28/2021    4:26 AM 12/27/2021    3:35 AM  Hepatic Function  Total Protein 6.0 - 8.3 g/dL 7.0  5.5  5.2   Albumin 3.5 - 5.2 g/dL 4.3  2.9  2.7   AST 0 - 37 U/L 21  30  48   ALT 0 - 35 U/L 18  51  66   Alk Phosphatase 39 - 117 U/L 83  119  130   Total Bilirubin 0.2 - 1.2 mg/dL 1.1  1.2  1.5   Bilirubin, Direct 0.0 - 0.2 mg/dL   0.6     Lab Results  Component Value Date/Time   TSH 4.414 12/25/2021 06:37 PM   TSH 2.80 03/28/2021 10:18 AM   TSH 3.810 10/24/2018 12:09 PM   FREET4 0.98 04/25/2013 09:48 AM       Latest Ref Rng & Units 01/06/2022    9:13 AM 12/28/2021    4:26 AM 12/27/2021  3:35 AM  CBC  WBC 4.0 - 10.5 K/uL 9.7  9.5  7.6   Hemoglobin 12.0 - 15.0 g/dL 03.9  05.6  46.9   Hematocrit 36.0 - 46.0 % 39.2  31.7  29.4   Platelets 150.0 - 400.0 K/uL 254.0  227  185     Lab Results  Component Value Date/Time   VD25OH 31.15 03/28/2021 10:18 AM   VD25OH 31.41 07/02/2020 08:02 AM    Clinical ASCVD: Yes  The ASCVD Risk score (Arnett DK, et al., 2019) failed to calculate for the following reasons:   The 2019 ASCVD risk score is only valid for ages 56 to 8       10/17/2021   10:36 AM 09/05/2021   10:50 AM 08/14/2021    2:56 PM  Depression screen PHQ 2/9  Decreased Interest 0 3 0  Down, Depressed, Hopeless 0 1 0  PHQ - 2 Score 0 4 0  Altered sleeping 0 0   Tired, decreased energy 0 3   Change in appetite 0 2   Feeling bad or failure about yourself  0 3   Trouble concentrating 0 0   Moving slowly  or fidgety/restless 0 0   Suicidal thoughts 0 1   PHQ-9 Score 0 13   Difficult doing work/chores Not difficult at all Somewhat difficult     CHA2DS2/VAS Stroke Risk Points  Current as of 3 days ago     6 >= 2 Points: High Risk  1 - 1.99 Points: Medium Risk  0 Points: Low Risk    Last Change: N/A      Points Metrics  0 Has Congestive Heart Failure:  No    Current as of 3 days ago  1 Has Vascular Disease:  Yes    Current as of 3 days ago  1 Has Hypertension:  Yes    Current as of 3 days ago  2 Age:  15    Current as of 3 days ago  1 Has Diabetes:  Yes    Current as of 3 days ago  0 Had Stroke:  No  Had TIA:  No  Had Thromboembolism:  No    Current as of 3 days ago  1 Female:  Yes    Current as of 3 days ago    Social History   Tobacco Use  Smoking Status Never  Smokeless Tobacco Never   BP Readings from Last 3 Encounters:  03/03/22 130/83  01/29/22 126/80  01/28/22 98/60   Pulse Readings from Last 3 Encounters:  03/03/22 (!) 109  01/29/22 68  01/28/22 98   Wt Readings from Last 3 Encounters:  03/03/22 156 lb (70.8 kg)  01/29/22 156 lb (70.8 kg)  01/28/22 157 lb (71.2 kg)   BMI Readings from Last 3 Encounters:  03/03/22 25.96 kg/m  01/29/22 25.96 kg/m  01/28/22 26.13 kg/m    Assessment/Interventions: Review of patient past medical history, allergies, medications, health status, including review of consultants reports, laboratory and other test data, was performed as part of comprehensive evaluation and provision of chronic care management services.   SDOH:  (Social Determinants of Health) assessments and interventions performed: {yes/no:20286}  SDOH Screenings   Alcohol Screen: Low Risk  (08/14/2021)   Alcohol Screen    Last Alcohol Screening Score (AUDIT): 0  Depression (PHQ2-9): Low Risk  (10/17/2021)   Depression (PHQ2-9)    PHQ-2 Score: 0  Recent Concern: Depression (PHQ2-9) - Medium Risk (09/05/2021)   Depression (PHQ2-9)  PHQ-2 Score: 13   Financial Resource Strain: Low Risk  (08/14/2021)   Overall Financial Resource Strain (CARDIA)    Difficulty of Paying Living Expenses: Not hard at all  Food Insecurity: No Food Insecurity (08/14/2021)   Hunger Vital Sign    Worried About Running Out of Food in the Last Year: Never true    [place] in the Last Year: Never true  Housing: Low Risk  (08/14/2021)   Housing    Last Housing Risk Score: 0  Physical Activity: Inactive (08/14/2021)   Exercise Vital Sign    Days of Exercise per Week: 0 days    Minutes of Exercise per Session: 0 min  Social Connections: Moderately Isolated (08/14/2021)   Social Connection and Isolation Panel [NHANES]    Frequency of Communication with Friends and Family: Once a week    Frequency of Social Gatherings with Friends and Family: Twice a week    Attends Religious Services: More than 4 times per year    Active Member of [employer] or Organizations: No    Attends Archivist Meetings: Never    Marital Status: Widowed  Stress: No Stress Concern Present (08/14/2021)   [place]    Feeling of Stress : Not at all  Tobacco Use: Low Risk  (03/03/2022)   Patient History    Smoking Tobacco Use: Never    Smokeless Tobacco Use: Never    Passive Exposure: Not on file  Transportation Needs: No Transportation Needs (08/14/2021)   PRAPARE - Transportation    Lack of Transportation (Medical): No    Lack of Transportation (Non-Medical): No    CCM Care Plan  Allergies  Allergen Reactions   Amoxicillin-Pot Clavulanate Swelling and Other (See Comments)    Augmentin per patient, tongue swelling   Nitrofurantoin Diarrhea    Nausea, jaundice, liver enzymes abnormal.    Medications Reviewed Today     Reviewed by Celene Kras, CMA (Certified Medical Assistant) on 03/03/22 at [place] List Status: <None>   Medication Order Taking? Sig Documenting Provider Last Dose Status  Informant  atorvastatin (LIPITOR) 20 MG tablet [id number] Yes TAKE 1 TABLET BY MOUTH EVERY DAY Minus Breeding, MD Taking Active   Blood Glucose Monitoring Suppl (ACCU-CHEK AVIVA PLUS) w/Device KIT [id number] Yes Use to check blood once sugar daily. [name], MD Taking Active Child  Calcium Carbonate-Vit D-Min (CALCIUM 1200 PO) [id number] Yes Take by mouth. 1 daily [provider] Taking Active   carvedilol (COREG) 12.5 MG tablet [id number] Yes TAKE 1 TABLET (12.$RemoveBefo'5MG'kcjdsUGsudF$  TOTAL) BY MOUTH TWICE A DAY WITH MEALS  Patient taking differently: Take 12.5 mg by mouth 2 (two) times daily with a meal.   Bedsole, Amy E, MD Taking Active Child  cetirizine (ZYRTEC) 10 MG tablet [id number] Yes Take 10 mg by mouth at bedtime. [provider] Taking Active Child  cholestyramine light (PREVALITE) 4 g packet [id number] Yes USE 1 PACKET TWICE DAILY WITH A MEAL, AT LEAST 2 HOURS AWAY FROM OTHER MEDS  Patient needs an appointment for further refills.  Patient taking differently: Take 4 g by mouth daily.   Milus Banister, MD Taking Active Child  citalopram (CELEXA) 40 MG tablet [id number] Yes TAKE 1 TABLET BY MOUTH EVERY DAY  Patient taking differently: Take 40 mg by mouth daily.   [name], MD Taking Active Child  diltiazem (CARDIZEM CD) 180 MG 24 hr capsule [id number] Yes TAKE 1 CAPSULE BY  MOUTH EVERY DAY  Patient taking differently: Take 180 mg by mouth daily.   [name], MD Taking Active Child  ELIQUIS 2.5 MG TABS tablet [id number] Yes TAKE 1 TABLET BY MOUTH TWICE A DAY  Patient taking differently: Take 2.5 mg by mouth 2 (two) times daily.   [name], MD Taking Active Child  ferrous sulfate 325 (65 FE) MG EC tablet [id number] Yes Take 325 mg by mouth 2 (two) times daily. [name], MD Taking Active Child  glucose blood (ACCU-CHEK AVIVA PLUS) test strip [id number] Yes Use to check blood sugar daily. [name], MD Taking Active Child  isosorbide mononitrate (IMDUR) 30 MG 24 hr  tablet [id number] Yes TAKE 1 TABLET BY MOUTH EVERY DAY  Patient taking differently: Take 30 mg by mouth daily.   Minus Breeding, MD Taking Active Child  losartan (COZAAR) 100 MG tablet [id number] Yes Take 1 tablet (100 mg total) by mouth daily. Minus Breeding, MD Taking Active Child  Magnesium Gluconate (MAGNESIUM 27 PO) [id number] Yes Take by mouth. 1 daily [provider] Taking Active   omeprazole (PRILOSEC) 20 MG capsule [id number] Yes TAKE 1 CAPSULE BY MOUTH EVERY DAY  Patient taking differently: Take 20 mg by mouth daily.   [name], MD Taking Active Child  ondansetron (ZOFRAN-ODT) 4 MG disintegrating tablet [id number] Yes Take 1 tablet (4 mg total) by mouth every 8 (eight) hours as needed. Vladimir Crofts, MD Taking Active Child  Vitamin D, Ergocalciferol, (DRISDOL) 1.25 MG (50000 UNIT) CAPS capsule [id number] Yes TAKE 1 CAPSULE (50,000 UNITS TOTAL) BY MOUTH EVERY 7 (SEVEN) DAYS [name], MD Taking Active             Patient Active Problem List   Diagnosis Date Noted   Osteoporosis 02/02/2022   Mild dementia ([place]) 01/06/2022   Stage 3b chronic kidney disease (CKD) ([place]) 12/29/2021   Hypokalemia 12/29/2021   Hypomagnesemia 12/29/2021   Hyponatremia 12/26/2021   Cardiomegaly 12/26/2021   Malnutrition of moderate degree 12/26/2021   Hypotension due to hypovolemia 12/25/2021   Dehydration 12/25/2021   Elevated LFTs 12/25/2021   Hematoma 12/25/2021   Saccular aneurysm 09/05/2021   Secondary hyperparathyroidism ([place]) 04/11/2021   Ischemic vascular dementia ([place]) 04/11/2021   Secondary hypoparathyroidism ([place]) 04/08/2021   Hypertension associated with diabetes ([place]) 01/09/2020   Acquired trigger finger of left ring finger 07/11/2019   Osteoarthritis of both wrists 07/11/2019   Coronary artery disease involving native coronary artery of native heart without angina pectoris 10/24/2018   Senile purpura ([place]) 03/01/2018   History of total replacement of right  shoulder joint 11/06/2016   Acute anemia 11/29/2015   Spinal stenosis, lumbar region, with neurogenic claudication 11/26/2015    Class: Chronic   Mixed incontinence urge and stress 10/29/2015   CKD stage 4 due to type 2 diabetes mellitus ([place]) 06/25/2015   Counseling regarding end of life decision making 10/26/2014   S/P total knee replacement using cement 04/03/2014   Atrial fibrillation ([place]) 10/17/2010   Allergic rhinitis 01/23/2009   ADENOCARCINOMA, COLON, CECUM 10/05/2008   Coronary atherosclerosis 11/23/2007   ANXIETY 09/19/2007   Major depressive disorder, recurrent episode, moderate ([place]) 09/19/2007   GERD 09/19/2007   Controlled type 2 diabetes mellitus with diabetic nephropathy ([place]) 03/21/2007   SYNDROME, CARPAL TUNNEL 03/21/2007   Hyperlipidemia associated with type 2 diabetes mellitus ([place]) 03/21/2007   IBS 03/21/2007   Osteoarthrosis, unspecified whether generalized or localized, unspecified site 03/21/2007    Immunization History  Administered Date(s) Administered   Influenza Split 06/29/2011, 07/08/2011, 06/17/2013   Influenza Whole 07/01/2007, 06/18/2008, 07/12/2009, 07/17/2010   Influenza, High Dose Seasonal PF 06/25/2014, 06/10/2015, 06/06/2016, 05/26/2017, 05/28/2018, 05/21/2019, 05/16/2020, 06/08/2021   Moderna Sars-Covid-2 Vaccination 01/25/2020, 02/22/2020   PFIZER Comirnaty(Gray Top)Covid-19 Tri-Sucrose Vaccine 02/14/2021   PFIZER(Purple Top)SARS-COV-2 Vaccination 09/08/2020   Pneumococcal Conjugate-13 10/26/2014   Pneumococcal Polysaccharide-23 09/29/2003, 05/20/2012   Td 07/12/2009    Conditions to be addressed/monitored:  {USCCMDZASSESSMENTOPTIONS:23563}  There are no care plans that you recently modified to display for this patient.    Medication Assistance: {MEDASSISTANCEINFO:25044}  Compliance/Adherence/Medication fill history: Care Gaps: ***  Star-Rating Drugs: Losartan - PDC 100%  Medication Access: Within the past 30 days, how often  has patient missed a dose of medication? *** Is a pillbox or other method used to improve adherence? {YES/NO:21197} Factors that may affect medication adherence? {CHL DESC; BARRIERS:21522} Are meds synced by current pharmacy? {YES/NO:21197} Are meds delivered by current pharmacy? {YES/NO:21197} Does patient experience delays in picking up medications due to transportation concerns? {YES/NO:21197}  [facility] Reviewed: Is patient disadvantaged to use [facility]?: {YES/NO:21197} Current Rx insurance plan: *** Name and location of Current pharmacy:  [id number] - [place], [place] [address] [place] [address] Phone: [phone] Fax: [phone]  [facility] services reviewed with patient today?: {YES/NO:21197} Patient requests to transfer care to [facility]?: {YES/NO:21197} Reason patient declined to change pharmacies: {US patient preference:27474}   Care Plan and Follow Up Patient Decision:  {FOLLOWUP:24991}  Plan: {CM FOLLOW UP QQPY:19509}  ***    Current Barriers:  {pharmacybarriers:24917}  Pharmacist Clinical Goal(s):  Patient will {PHARMACYGOALCHOICES:24921} through collaboration with PharmD and provider.   Interventions: 1:1 collaboration with [name], MD regarding development and update of comprehensive plan of care as evidenced by provider attestation and co-signature Inter-disciplinary care team collaboration (see longitudinal plan of care) Comprehensive medication review performed; medication list updated in electronic medical record  Hypertension (BP goal <130/80) -{US controlled/uncontrolled:25276} -Current treatment: Carvedilol 12.5 mg BID Diltiazem 180 mg daily Isosorbide MN 30 mg daily Losartan 100 mg daily -Medications previously tried: ***  -Current home readings: *** -Current dietary habits: *** -Current exercise habits: *** -{ACTIONS;DENIES/REPORTS:21021675::"Denies"}  hypotensive/hypertensive symptoms -Educated on {CCM BP Counseling:25124} -Counseled to monitor BP at home ***, document, and provide log at future appointments -{CCMPHARMDINTERVENTION:25122}  Hyperlipidemia: (LDL goal < 70) -{US controlled/uncontrolled:25276} -Current treatment: Atorvastatin 20 mg daily Cholestyramine 4g packet -Medications previously tried: ***  -Current dietary patterns: *** -Current exercise habits: *** -Educated on {CCM HLD Counseling:25126} -{CCMPHARMDINTERVENTION:25122}  Diabetes (A1c goal <7%) -Diet-controlled - A1c 6.3% (11/2021) in pre-diabetic range (peak 7.0% in 2015) -Educated on {CCM DM COUNSELING:25123} -Counseled to check feet daily and get yearly eye exams -{CCMPHARMDINTERVENTION:25122}  Atrial Fibrillation (Goal: prevent stroke and major bleeding) -{US controlled/uncontrolled:25276} -CHADSVASC: 6 -Current treatment: Carvedilol 12.5 mg BID Diltiazem 180 mg daily Eliquis 2.5 mg BID -Medications previously tried: *** -Home BP and HR readings: ***  -Counseled on {CCMAFIBCOUNSELING:25120} -{CCMPHARMDINTERVENTION:25122}  GERD (Goal: minimize symptoms of reflux ) -{US controlled/uncontrolled:25276} -Triggering factors: *** -Hx of Bleeds/ulcers: {yes/no:20286} -Current treatment  Omeprazole 20 mg daily -Medications previously tried: none reported  -Counseled on small meals, elevating head, and sleeping on left side -{CCMPHARMDINTERVENTION:25122}  Depression/Anxiety (Goal: manage symptoms) -{US controlled/uncontrolled:25276} -PHQ9: 0 (09/2021) - minimal depression -GAD7: not on file -Connected with PCP for mental health support -Current treatment: Citalopram 40 mg daily -Medications previously tried/failed: *** -Educated on {CCM mental health counseling:25127} -{CCMPHARMDINTERVENTION:25122}  Chronic Kidney Disease  Stage 3b  -All medications assessed for renal dosing and appropriateness in chronic kidney disease. -Follows with nephrology  (Dr Candiss Norse, [place]) -{CCMPHARMDINTERVENTION:25122}  Patient Goals/Self-Care Activities Patient will:  - {pharmacypatientgoals:24919}

## 2022-03-16 NOTE — Telephone Encounter (Signed)
  Chronic Care Management   Outreach Note  03/16/2022 Name: Tonya [name] MRN: [id number] DOB: July 25, 1941  Referred by: [name], MD  Patient had a phone appointment scheduled with clinical pharmacist today.  An unsuccessful telephone outreach was attempted today. The patient was referred to the pharmacist for assistance with medications, care management and care coordination.   Patient will NOT be penalized in any way for missing a CCM appointment. The no-show fee does not apply.  If possible, a message was left to return call to: [phone] or to [facility].  Charlene Brooke, PharmD, BCACP Clinical Pharmacist [facility] [phone]

## 2022-03-17 ENCOUNTER — Other Ambulatory Visit (INDEPENDENT_AMBULATORY_CARE_PROVIDER_SITE_OTHER): Payer: Medicare Other

## 2022-03-17 DIAGNOSIS — E1121 Type 2 diabetes mellitus with diabetic nephropathy: Secondary | ICD-10-CM | POA: Diagnosis not present

## 2022-03-17 LAB — LIPID PANEL
Cholesterol: 101 mg/dL (ref 0–200)
HDL: 41.9 mg/dL (ref >39.00)
LDL Cholesterol: 32 mg/dL (ref 0–99)
NonHDL: 58.97
Total CHOL/HDL Ratio: 2
Triglycerides: 136 mg/dL (ref 0.0–149.0)
VLDL: 27.2 mg/dL (ref 0.0–40.0)

## 2022-03-17 LAB — COMPREHENSIVE METABOLIC PANEL
ALT: 30 U/L (ref 0–35)
AST: 29 U/L (ref 0–37)
Albumin: 4.2 g/dL (ref 3.5–5.2)
Alkaline Phosphatase: 58 U/L (ref 39–117)
BUN: 21 mg/dL (ref 6–23)
CO2: 27 mEq/L (ref 19–32)
Calcium: 9.3 mg/dL (ref 8.4–10.5)
Chloride: 106 mEq/L (ref 96–112)
Creatinine, Ser: 1.38 mg/dL — ABNORMAL HIGH (ref 0.40–1.20)
GFR: 35.78 mL/min — ABNORMAL LOW (ref >60.00)
Glucose, Bld: 121 mg/dL — ABNORMAL HIGH (ref 70–99)
Potassium: 4 mEq/L (ref 3.5–5.1)
Sodium: 142 mEq/L (ref 135–145)
Total Bilirubin: 0.7 mg/dL (ref 0.2–1.2)
Total Protein: 7 g/dL (ref 6.0–8.3)

## 2022-03-17 LAB — HEMOGLOBIN A1C: Hgb A1c MFr Bld: 6.5 % (ref 4.6–6.5)

## 2022-03-18 NOTE — Progress Notes (Signed)
No critical labs need to be addressed urgently. We will discuss labs in detail at upcoming office visit.   

## 2022-03-24 ENCOUNTER — Encounter: Payer: Self-pay | Admitting: Family Medicine

## 2022-03-24 ENCOUNTER — Ambulatory Visit (INDEPENDENT_AMBULATORY_CARE_PROVIDER_SITE_OTHER): Payer: Medicare Other | Admitting: Family Medicine

## 2022-03-24 DIAGNOSIS — I152 Hypertension secondary to endocrine disorders: Secondary | ICD-10-CM | POA: Diagnosis not present

## 2022-03-24 DIAGNOSIS — E1121 Type 2 diabetes mellitus with diabetic nephropathy: Secondary | ICD-10-CM

## 2022-03-24 DIAGNOSIS — E785 Hyperlipidemia, unspecified: Secondary | ICD-10-CM

## 2022-03-24 DIAGNOSIS — E1169 Type 2 diabetes mellitus with other specified complication: Secondary | ICD-10-CM

## 2022-03-24 DIAGNOSIS — F03B18 Unspecified dementia, moderate, with other behavioral disturbance: Secondary | ICD-10-CM | POA: Diagnosis not present

## 2022-03-24 DIAGNOSIS — F331 Major depressive disorder, recurrent, moderate: Secondary | ICD-10-CM

## 2022-03-24 DIAGNOSIS — E1159 Type 2 diabetes mellitus with other circulatory complications: Secondary | ICD-10-CM | POA: Diagnosis not present

## 2022-03-24 MED ORDER — CHOLESTYRAMINE LIGHT 4 G PO PACK: 4.0000 g | PACK | Freq: Every day | ORAL | Status: DC | PRN

## 2022-03-24 NOTE — Progress Notes (Signed)
Patient ID: Tonya [name], female    DOB: 10-14-1940, 81 y.o.   MRN: [id number]  This visit was conducted in person.  BP 130/74   Pulse 67   Temp 98.3 F (36.8 C) (Oral)   Ht $R'5\' 5"'Ps$  (1.651 m)   Wt 160 lb 8 oz (72.8 kg)   SpO2 96%   BMI 26.71 kg/m    CC:  Chief Complaint  Patient presents with   Diabetes    Subjective:   HPI: Tonya [name] is a 81 y.o. female presenting on 03/24/2022 for Diabetes   Diabetes:   Diet controlled . Lab Results  Component Value Date   HGBA1C 6.5 03/17/2022  Using medications without difficulties: Hypoglycemic episodes: Hyperglycemic episodes: Feet problems: Blood Sugars averaging:  not checking at home eye exam within last year:    Hypertension:   BP Readings from Last 3 Encounters:  03/24/22 130/74  03/03/22 130/83  01/29/22 126/80  Using medication without problems or lightheadedness:  none Chest pain with exertion: none Edema: none Short of breath: none Average home BPs: Other issues:   Elevated Cholesterol:  Good control on atorvastatin 20 mg daily Lab Results  Component Value Date   CHOL 101 03/17/2022   HDL 41.90 03/17/2022   LDLCALC 32 03/17/2022   LDLDIRECT 50.0 10/22/2014   TRIG 136.0 03/17/2022   CHOLHDL 2 03/17/2022  Using medications without problems: Muscle aches:  Diet compliance: Exercise: Other complaints:  Dementia, moderate... continuing to worsened.. confusion   when routine changed. She has been angry at time when corrected. Occ wandering at night but usually sleeping well.  She is taking citalopram 40 mg daily for mood.  Good po intake, but has to be encouraged to eat. Daughter works from home [employer] from Last 3 Encounters:  03/24/22 160 lb 8 oz (72.8 kg)  03/03/22 156 lb (70.8 kg)  01/29/22 156 lb (70.8 kg)     Relevant past medical, surgical, family and social history reviewed and updated as indicated. Interim medical history since our last visit reviewed. Allergies and  medications reviewed and updated. Outpatient Medications Prior to Visit  Medication Sig Dispense Refill   atorvastatin (LIPITOR) 20 MG tablet TAKE 1 TABLET BY MOUTH EVERY DAY 90 tablet 1   Blood Glucose Monitoring Suppl (ACCU-CHEK AVIVA PLUS) w/Device KIT Use to check blood once sugar daily. 1 kit 0   Calcium Carbonate-Vit D-Min (CALCIUM 1200 PO) Take by mouth. 1 daily     carvedilol (COREG) 12.5 MG tablet TAKE 1 TABLET (12.$RemoveBefo'5MG'eVKhHibSaAl$  TOTAL) BY MOUTH TWICE A DAY WITH MEALS 180 tablet 3   cetirizine (ZYRTEC) 10 MG tablet Take 10 mg by mouth at bedtime.     cholestyramine light (PREVALITE) 4 g packet USE 1 PACKET TWICE DAILY WITH A MEAL, AT LEAST 2 HOURS AWAY FROM OTHER MEDS  Patient needs an appointment for further refills. 540 packet 0   citalopram (CELEXA) 40 MG tablet TAKE 1 TABLET BY MOUTH EVERY DAY (Patient taking differently: Take 40 mg by mouth daily.) 90 tablet 1   diltiazem (CARDIZEM CD) 180 MG 24 hr capsule TAKE 1 CAPSULE BY MOUTH EVERY DAY (Patient taking differently: Take 180 mg by mouth daily.) 90 capsule 3   ELIQUIS 2.5 MG TABS tablet TAKE 1 TABLET BY MOUTH TWICE A DAY (Patient taking differently: Take 2.5 mg by mouth 2 (two) times daily.) 60 tablet 11   ferrous sulfate 325 (65 FE) MG EC tablet Take 325 mg by mouth 2 (two) times  daily.     glucose blood (ACCU-CHEK AVIVA PLUS) test strip Use to check blood sugar daily. 100 each 3   isosorbide mononitrate (IMDUR) 30 MG 24 hr tablet TAKE 1 TABLET BY MOUTH EVERY DAY (Patient taking differently: Take 30 mg by mouth daily.) 90 tablet 2   losartan (COZAAR) 100 MG tablet Take 1 tablet (100 mg total) by mouth daily. 90 tablet 3   Magnesium Gluconate (MAGNESIUM 27 PO) Take by mouth. 1 daily     omeprazole (PRILOSEC) 20 MG capsule TAKE 1 CAPSULE BY MOUTH EVERY DAY (Patient taking differently: Take 20 mg by mouth daily.) 90 capsule 3   ondansetron (ZOFRAN-ODT) 4 MG disintegrating tablet Take 1 tablet (4 mg total) by mouth every 8 (eight) hours as needed.  20 tablet 0   Vitamin D, Ergocalciferol, (DRISDOL) 1.25 MG (50000 UNIT) CAPS capsule TAKE 1 CAPSULE (50,000 UNITS TOTAL) BY MOUTH EVERY 7 (SEVEN) DAYS 12 capsule 0   No facility-administered medications prior to visit.     Per HPI unless specifically indicated in ROS section below Review of Systems  Constitutional:  Positive for fatigue. Negative for fever.  HENT:  Negative for congestion.   Eyes:  Negative for pain.  Respiratory:  Negative for cough and shortness of breath.   Cardiovascular:  Negative for chest pain, palpitations and leg swelling.  Gastrointestinal:  Negative for abdominal pain.  Genitourinary:  Negative for dysuria and vaginal bleeding.  Musculoskeletal:  Negative for back pain.  Neurological:  Negative for syncope, light-headedness and headaches.  Psychiatric/Behavioral:  Negative for dysphoric mood.    Objective:  BP 130/74   Pulse 67   Temp 98.3 F (36.8 C) (Oral)   Ht $R'5\' 5"'tx$  (1.651 m)   Wt 160 lb 8 oz (72.8 kg)   SpO2 96%   BMI 26.71 kg/m   Wt Readings from Last 3 Encounters:  03/24/22 160 lb 8 oz (72.8 kg)  03/03/22 156 lb (70.8 kg)  01/29/22 156 lb (70.8 kg)      Physical Exam Constitutional:      General: She is not in acute distress.    Appearance: Normal appearance. She is well-developed. She is not ill-appearing or toxic-appearing.  HENT:     Head: Normocephalic.     Right Ear: Hearing, tympanic membrane, ear canal and external ear normal. Tympanic membrane is not erythematous, retracted or bulging.     Left Ear: Hearing, tympanic membrane, ear canal and external ear normal. Tympanic membrane is not erythematous, retracted or bulging.     Nose: No mucosal edema or rhinorrhea.     Right Sinus: No maxillary sinus tenderness or frontal sinus tenderness.     Left Sinus: No maxillary sinus tenderness or frontal sinus tenderness.     Mouth/Throat:     Pharynx: Uvula midline.  Eyes:     General: Lids are normal. Lids are everted, no foreign bodies  appreciated.     Conjunctiva/sclera: Conjunctivae normal.     Pupils: Pupils are equal, round, and reactive to light.  Neck:     Thyroid: No thyroid mass or thyromegaly.     Vascular: No carotid bruit.     Trachea: Trachea normal.  Cardiovascular:     Rate and Rhythm: Normal rate and regular rhythm.     Pulses: Normal pulses.     Heart sounds: Normal heart sounds, S1 normal and S2 normal. No murmur heard.    No friction rub. No gallop.  Pulmonary:     Effort: Pulmonary effort  is normal. No tachypnea or respiratory distress.     Breath sounds: Normal breath sounds. No decreased breath sounds, wheezing, rhonchi or rales.  Abdominal:     General: Bowel sounds are normal.     Palpations: Abdomen is soft.     Tenderness: There is no abdominal tenderness.  Musculoskeletal:     Cervical back: Normal range of motion and neck supple.  Skin:    General: Skin is warm and dry.     Findings: No rash.  Neurological:     Mental Status: She is alert.  Psychiatric:        Mood and Affect: Mood is not anxious or depressed.        Speech: Speech normal.        Behavior: Behavior normal. Behavior is cooperative.        Thought Content: Thought content normal.        Judgment: Judgment normal.       Results for orders placed or performed in visit on 03/17/22  Comprehensive metabolic panel  Result Value Ref Range   Sodium 142 135 - 145 mEq/L   Potassium 4.0 3.5 - 5.1 mEq/L   Chloride 106 96 - 112 mEq/L   CO2 27 19 - 32 mEq/L   Glucose, Bld 121 (H) 70 - 99 mg/dL   BUN 21 6 - 23 mg/dL   Creatinine, Ser 1.38 (H) 0.40 - 1.20 mg/dL   Total Bilirubin 0.7 0.2 - 1.2 mg/dL   Alkaline Phosphatase 58 39 - 117 U/L   AST 29 0 - 37 U/L   ALT 30 0 - 35 U/L   Total Protein 7.0 6.0 - 8.3 g/dL   Albumin 4.2 3.5 - 5.2 g/dL   GFR 35.78 (L) >60.00 mL/min   Calcium 9.3 8.4 - 10.5 mg/dL  Hemoglobin A1c  Result Value Ref Range   Hgb A1c MFr Bld 6.5 4.6 - 6.5 %  Lipid panel  Result Value Ref Range    Cholesterol 101 0 - 200 mg/dL   Triglycerides 136.0 0.0 - 149.0 mg/dL   HDL 41.90 >39.00 mg/dL   VLDL 27.2 0.0 - 40.0 mg/dL   LDL Cholesterol 32 0 - 99 mg/dL   Total CHOL/HDL Ratio 2    NonHDL 58.97      COVID 19 screen:  No recent travel or known exposure to COVID19 The patient denies respiratory symptoms of COVID 19 at this time. The importance of social distancing was discussed today.   Assessment and Plan Problem List Items Addressed This Visit     Controlled type 2 diabetes mellitus with diabetic nephropathy ([place]) (Chronic)    Diet controlled      Hyperlipidemia associated with type 2 diabetes mellitus (HCC) (Chronic)    Stable, chronic.  Continue current medication.   Good control on atorvastatin 20 mg daily      Hypertension associated with diabetes (HCC) (Chronic)    Stable, chronic.  Continue current medication.   Coreg 12.5 mg twice daily Diltiazem 180 mg XR daily Losartan 100 mg p.o. daily Imdur 30 mg p.o. daily      Major depressive disorder, recurrent episode, moderate (HCC) (Chronic)    Chronic stable control on citalopram 40 mg p.o. daily.      Moderate dementia (HCC)    Moderate, mild worsening control Discussed stability of routines to prevent delirium.   Start melatonin 3 mg at bedtime to help with sleep.  Daughter is reporting difficulty caring for her at home.  She  is able to do it at this time but it is gradually worsening.  We discussed placement options and I have given her the information for their licensed clinical social worker.          [name], MD

## 2022-04-05 ENCOUNTER — Other Ambulatory Visit: Payer: Self-pay | Admitting: Cardiology

## 2022-04-18 ENCOUNTER — Other Ambulatory Visit: Payer: Self-pay | Admitting: Gastroenterology

## 2022-04-18 ENCOUNTER — Other Ambulatory Visit: Payer: Self-pay | Admitting: Family Medicine

## 2022-04-28 ENCOUNTER — Other Ambulatory Visit: Payer: Self-pay | Admitting: Family Medicine

## 2022-04-28 NOTE — Assessment & Plan Note (Signed)
Stable, chronic.  Continue current medication.   Coreg 12.5 mg twice daily Diltiazem 180 mg XR daily Losartan 100 mg p.o. daily Imdur 30 mg p.o. daily

## 2022-04-28 NOTE — Assessment & Plan Note (Addendum)
Moderate, mild worsening control Discussed stability of routines to prevent delirium.   Start melatonin 3 mg at bedtime to help with sleep.  Daughter is reporting difficulty caring for her at home.  She is able to do it at this time but it is gradually worsening.  We discussed placement options and I have given her the information for their licensed clinical social worker.

## 2022-04-28 NOTE — Assessment & Plan Note (Signed)
Diet controlled.  

## 2022-04-28 NOTE — Assessment & Plan Note (Signed)
Stable, chronic.  Continue current medication.   Good control on atorvastatin 20 mg daily

## 2022-04-28 NOTE — Telephone Encounter (Signed)
Last office visit 03/24/22 for DM.  Last refilled 02/02/22 for #12 with no refills.  Last Vit D level 03/28/21 which was normal at 31.15 ng/mL.  Next Appt: 06/26/22.

## 2022-04-28 NOTE — Assessment & Plan Note (Signed)
Chronic stable control on citalopram 40 mg p.o. daily.

## 2022-06-17 DIAGNOSIS — N1832 Chronic kidney disease, stage 3b: Secondary | ICD-10-CM | POA: Diagnosis not present

## 2022-06-17 DIAGNOSIS — I10 Essential (primary) hypertension: Secondary | ICD-10-CM | POA: Diagnosis not present

## 2022-06-17 DIAGNOSIS — E1122 Type 2 diabetes mellitus with diabetic chronic kidney disease: Secondary | ICD-10-CM | POA: Diagnosis not present

## 2022-06-17 DIAGNOSIS — N2581 Secondary hyperparathyroidism of renal origin: Secondary | ICD-10-CM | POA: Diagnosis not present

## 2022-06-26 ENCOUNTER — Ambulatory Visit (INDEPENDENT_AMBULATORY_CARE_PROVIDER_SITE_OTHER): Payer: Medicare Other | Admitting: Family Medicine

## 2022-06-26 ENCOUNTER — Encounter: Payer: Self-pay | Admitting: Family Medicine

## 2022-06-26 VITALS — BP 130/80 | HR 96 | Temp 98.2°F | Ht 65.0 in | Wt 156.4 lb

## 2022-06-26 DIAGNOSIS — F331 Major depressive disorder, recurrent, moderate: Secondary | ICD-10-CM | POA: Diagnosis not present

## 2022-06-26 DIAGNOSIS — F03B18 Unspecified dementia, moderate, with other behavioral disturbance: Secondary | ICD-10-CM | POA: Diagnosis not present

## 2022-06-26 DIAGNOSIS — F0150 Vascular dementia without behavioral disturbance: Secondary | ICD-10-CM

## 2022-06-26 DIAGNOSIS — N184 Chronic kidney disease, stage 4 (severe): Secondary | ICD-10-CM | POA: Diagnosis not present

## 2022-06-26 DIAGNOSIS — Z23 Encounter for immunization: Secondary | ICD-10-CM | POA: Diagnosis not present

## 2022-06-26 DIAGNOSIS — E1122 Type 2 diabetes mellitus with diabetic chronic kidney disease: Secondary | ICD-10-CM | POA: Diagnosis not present

## 2022-06-26 NOTE — Progress Notes (Signed)
Patient ID: Tonya [name], female    DOB: 12/11/1940, 81 y.o.   MRN: [id number]  This visit was conducted in person.  BP 130/80   Pulse 96   Temp 98.2 F (36.8 C) (Oral)   Ht _0  (1.651 m)   Wt 156 lb 6 oz (70.9 kg)   SpO2 95%   BMI 26.02 kg/m    CC: .cc Subjective:   HPI: Tonya [name] is a 81 y.o. female presents with her daughter with history of atrial fibrillation, chronic kidney disease, MDD, controlled type 2 diabetes, coronary artery disease and dementia presenting on 06/26/2022 for Follow-up (Dementia)  Reviewed office visit from June 17, 2022 from Dr. Candiss Norse nephrology, no changes made  Ischemic vascular dementia: Fairly stable in last 3 months. At last office visit in August she was having worsening of symptoms.  She was recommended to start melatonin 3 mg at bedtime to help with sleep. Daughter has not needed this.  MDD, stable on celexa, she is minimally active, but does not seem unhappy. Watching TV.  Pt denies any pain or concerns today.   She does not drink water.. drink diet tea.  Moderate food intake. Wt Readings from Last 3 Encounters:  06/26/22 156 lb 6 oz (70.9 kg)  03/24/22 160 lb 8 oz (72.8 kg)  03/03/22 156 lb (70.8 kg)       Relevant past medical, surgical, family and social history reviewed and updated as indicated. Interim medical history since our last visit reviewed. Allergies and medications reviewed and updated. Outpatient Medications Prior to Visit  Medication Sig Dispense Refill   atorvastatin (LIPITOR) 20 MG tablet TAKE 1 TABLET BY MOUTH EVERY DAY 90 tablet 1   Blood Glucose Monitoring Suppl (ACCU-CHEK AVIVA PLUS) w/Device KIT Use to check blood once sugar daily. 1 kit 0   Calcium Carbonate-Vit D-Min (CALCIUM 1200 PO) Take by mouth. 1 daily     carvedilol (COREG) 12.5 MG tablet TAKE 1 TABLET (12.5MG TOTAL) BY MOUTH TWICE A DAY WITH MEALS 180 tablet 3   cetirizine (ZYRTEC) 10 MG tablet Take 10 mg by mouth at bedtime.      cholestyramine light (PREVALITE) 4 g packet USE 1 PACKET TWICE DAILY WITH A MEAL, AT LEAST 2 HOURS AWAY FROM OTHER MEDS PATIENT NEEDS OFFICE VISIT 540 packet 3   citalopram (CELEXA) 40 MG tablet TAKE 1 TABLET BY MOUTH EVERY DAY 90 tablet 0   diltiazem (CARDIZEM CD) 180 MG 24 hr capsule TAKE 1 CAPSULE BY MOUTH EVERY DAY (Patient taking differently: Take 180 mg by mouth daily.) 90 capsule 3   ELIQUIS 2.5 MG TABS tablet TAKE 1 TABLET BY MOUTH TWICE A DAY (Patient taking differently: Take 2.5 mg by mouth 2 (two) times daily.) 60 tablet 11   ferrous sulfate 325 (65 FE) MG EC tablet Take 325 mg by mouth 2 (two) times daily.     glucose blood (ACCU-CHEK AVIVA PLUS) test strip Use to check blood sugar daily. 100 each 3   isosorbide mononitrate (IMDUR) 30 MG 24 hr tablet TAKE 1 TABLET BY MOUTH EVERY DAY (Patient taking differently: Take 30 mg by mouth daily.) 90 tablet 2   losartan (COZAAR) 100 MG tablet TAKE 1 TABLET BY MOUTH EVERY DAY 90 tablet 3   Magnesium Gluconate (MAGNESIUM 27 PO) Take by mouth. 1 daily     omeprazole (PRILOSEC) 20 MG capsule TAKE 1 CAPSULE BY MOUTH EVERY DAY (Patient taking differently: Take 20 mg by mouth daily.) 90 capsule  3   ondansetron (ZOFRAN-ODT) 4 MG disintegrating tablet Take 1 tablet (4 mg total) by mouth every 8 (eight) hours as needed. 20 tablet 0   Vitamin D, Ergocalciferol, (DRISDOL) 1.25 MG (50000 UNIT) CAPS capsule TAKE 1 CAPSULE (50,000 UNITS TOTAL) BY MOUTH EVERY 7 (SEVEN) DAYS 12 capsule 0   No facility-administered medications prior to visit.     Per HPI unless specifically indicated in ROS section below Review of Systems  Constitutional:  Negative for fatigue and fever.  HENT:  Negative for congestion.   Eyes:  Negative for pain.  Respiratory:  Negative for cough and shortness of breath.   Cardiovascular:  Negative for chest pain, palpitations and leg swelling.  Gastrointestinal:  Negative for abdominal pain.  Genitourinary:  Negative for dysuria and  vaginal bleeding.  Musculoskeletal:  Negative for back pain.  Neurological:  Negative for syncope, light-headedness and headaches.  Psychiatric/Behavioral:  Negative for dysphoric mood.    Objective:  BP 130/80   Pulse 96   Temp 98.2 F (36.8 C) (Oral)   Ht _0  (1.651 m)   Wt 156 lb 6 oz (70.9 kg)   SpO2 95%   BMI 26.02 kg/m   Wt Readings from Last 3 Encounters:  06/26/22 156 lb 6 oz (70.9 kg)  03/24/22 160 lb 8 oz (72.8 kg)  03/03/22 156 lb (70.8 kg)      Physical Exam Constitutional:      General: She is not in acute distress.    Appearance: Normal appearance. She is well-developed. She is not ill-appearing or toxic-appearing.  HENT:     Head: Normocephalic.     Right Ear: Hearing, tympanic membrane, ear canal and external ear normal. Tympanic membrane is not erythematous, retracted or bulging.     Left Ear: Hearing, tympanic membrane, ear canal and external ear normal. Tympanic membrane is not erythematous, retracted or bulging.     Nose: No mucosal edema or rhinorrhea.     Right Sinus: No maxillary sinus tenderness or frontal sinus tenderness.     Left Sinus: No maxillary sinus tenderness or frontal sinus tenderness.     Mouth/Throat:     Pharynx: Uvula midline.  Eyes:     General: Lids are normal. Lids are everted, no foreign bodies appreciated.     Conjunctiva/sclera: Conjunctivae normal.     Pupils: Pupils are equal, round, and reactive to light.  Neck:     Thyroid: No thyroid mass or thyromegaly.     Vascular: No carotid bruit.     Trachea: Trachea normal.  Cardiovascular:     Rate and Rhythm: Normal rate and regular rhythm.     Pulses: Normal pulses.     Heart sounds: Normal heart sounds, S1 normal and S2 normal. No murmur heard.    No friction rub. No gallop.  Pulmonary:     Effort: Pulmonary effort is normal. No tachypnea or respiratory distress.     Breath sounds: Normal breath sounds. No decreased breath sounds, wheezing, rhonchi or rales.  Abdominal:      General: Bowel sounds are normal.     Palpations: Abdomen is soft.     Tenderness: There is no abdominal tenderness.  Musculoskeletal:     Cervical back: Normal range of motion and neck supple.  Skin:    General: Skin is warm and dry.     Findings: No rash.  Neurological:     Mental Status: She is alert.     Cranial Nerves: Cranial nerves 2-12 are  intact.     Sensory: Sensation is intact.  Psychiatric:        Mood and Affect: Mood is not anxious or depressed.        Speech: Speech normal.        Behavior: Behavior normal. Behavior is cooperative.        Thought Content: Thought content normal.        Cognition and Memory: Cognition is impaired. She exhibits impaired recent memory.        Judgment: Judgment is inappropriate.       Results for orders placed or performed in visit on 03/17/22  Comprehensive metabolic panel  Result Value Ref Range   Sodium 142 135 - 145 mEq/L   Potassium 4.0 3.5 - 5.1 mEq/L   Chloride 106 96 - 112 mEq/L   CO2 27 19 - 32 mEq/L   Glucose, Bld 121 (H) 70 - 99 mg/dL   BUN 21 6 - 23 mg/dL   Creatinine, Ser 1.38 (H) 0.40 - 1.20 mg/dL   Total Bilirubin 0.7 0.2 - 1.2 mg/dL   Alkaline Phosphatase 58 39 - 117 U/L   AST 29 0 - 37 U/L   ALT 30 0 - 35 U/L   Total Protein 7.0 6.0 - 8.3 g/dL   Albumin 4.2 3.5 - 5.2 g/dL   GFR 35.78 (L) >60.00 mL/min   Calcium 9.3 8.4 - 10.5 mg/dL  Hemoglobin A1c  Result Value Ref Range   Hgb A1c MFr Bld 6.5 4.6 - 6.5 %  Lipid panel  Result Value Ref Range   Cholesterol 101 0 - 200 mg/dL   Triglycerides 136.0 0.0 - 149.0 mg/dL   HDL 41.90 >39.00 mg/dL   VLDL 27.2 0.0 - 40.0 mg/dL   LDL Cholesterol 32 0 - 99 mg/dL   Total CHOL/HDL Ratio 2    NonHDL 58.97      COVID 19 screen:  No recent travel or known exposure to COVID19 The patient denies respiratory symptoms of COVID 19 at this time. The importance of social distancing was discussed today.   Assessment and Plan    Problem List Items Addressed This  Visit     CKD stage 4 due to type 2 diabetes mellitus ([place]) (Chronic)    Followed by nephrology, stable.      Ischemic vascular dementia (HCC) (Chronic)    Chronic, fairly stable over the last 3 months.  Has not required melatonin for sleep.  No wandering and continued safety at home.      Major depressive disorder, recurrent episode, moderate (HCC) (Chronic)    Chronic, good control on Celexa 40 mg daily.      Hypomagnesemia    Recent low number at renal doctor lab visit.  She is on magnesium supplement.      Moderate dementia ([place]) - Primary   Other Visit Diagnoses     Need for influenza vaccination       Relevant Orders   Flu Vaccine QUAD High Dose(Fluad) (Completed)        [name], MD

## 2022-06-26 NOTE — Assessment & Plan Note (Signed)
Chronic, good control on Celexa 40 mg daily.

## 2022-06-26 NOTE — Assessment & Plan Note (Signed)
Chronic, fairly stable over the last 3 months.  Has not required melatonin for sleep.  No wandering and continued safety at home.

## 2022-06-26 NOTE — Assessment & Plan Note (Signed)
Followed by nephrology, stable.   

## 2022-06-26 NOTE — Assessment & Plan Note (Signed)
Recent low number at renal doctor lab visit.  She is on magnesium supplement.

## 2022-07-16 ENCOUNTER — Other Ambulatory Visit: Payer: Self-pay | Admitting: Family Medicine

## 2022-07-17 ENCOUNTER — Emergency Department (HOSPITAL_COMMUNITY): Payer: Medicare Other

## 2022-07-17 ENCOUNTER — Encounter (HOSPITAL_COMMUNITY): Payer: Self-pay | Admitting: *Deleted

## 2022-07-17 ENCOUNTER — Other Ambulatory Visit: Payer: Self-pay

## 2022-07-17 ENCOUNTER — Emergency Department (HOSPITAL_COMMUNITY)
Admission: EM | Admit: 2022-07-17 | Discharge: 2022-07-18 | Disposition: A | Discharge status: Home / Self Care | Payer: Medicare Other | Attending: Emergency Medicine | Admitting: Emergency Medicine

## 2022-07-17 DIAGNOSIS — Z7984 Long term (current) use of oral hypoglycemic drugs: Secondary | ICD-10-CM | POA: Diagnosis not present

## 2022-07-17 DIAGNOSIS — S0990XA Unspecified injury of head, initial encounter: Secondary | ICD-10-CM | POA: Diagnosis present

## 2022-07-17 DIAGNOSIS — N189 Chronic kidney disease, unspecified: Secondary | ICD-10-CM | POA: Diagnosis not present

## 2022-07-17 DIAGNOSIS — Z7901 Long term (current) use of anticoagulants: Secondary | ICD-10-CM | POA: Diagnosis not present

## 2022-07-17 DIAGNOSIS — M189 Osteoarthritis of first carpometacarpal joint, unspecified: Secondary | ICD-10-CM | POA: Diagnosis not present

## 2022-07-17 DIAGNOSIS — S6992XA Unspecified injury of left wrist, hand and finger(s), initial encounter: Secondary | ICD-10-CM | POA: Diagnosis not present

## 2022-07-17 DIAGNOSIS — S5002XA Contusion of left elbow, initial encounter: Secondary | ICD-10-CM

## 2022-07-17 DIAGNOSIS — S098XXA Other specified injuries of head, initial encounter: Secondary | ICD-10-CM | POA: Diagnosis not present

## 2022-07-17 DIAGNOSIS — S42001A Fracture of unspecified part of right clavicle, initial encounter for closed fracture: Secondary | ICD-10-CM | POA: Diagnosis not present

## 2022-07-17 DIAGNOSIS — S199XXA Unspecified injury of neck, initial encounter: Secondary | ICD-10-CM | POA: Diagnosis not present

## 2022-07-17 DIAGNOSIS — R600 Localized edema: Secondary | ICD-10-CM | POA: Diagnosis not present

## 2022-07-17 DIAGNOSIS — I517 Cardiomegaly: Secondary | ICD-10-CM | POA: Diagnosis not present

## 2022-07-17 DIAGNOSIS — S299XXA Unspecified injury of thorax, initial encounter: Secondary | ICD-10-CM | POA: Diagnosis not present

## 2022-07-17 DIAGNOSIS — E1122 Type 2 diabetes mellitus with diabetic chronic kidney disease: Secondary | ICD-10-CM | POA: Diagnosis not present

## 2022-07-17 DIAGNOSIS — I2510 Atherosclerotic heart disease of native coronary artery without angina pectoris: Secondary | ICD-10-CM | POA: Diagnosis not present

## 2022-07-17 DIAGNOSIS — S8002XA Contusion of left knee, initial encounter: Secondary | ICD-10-CM | POA: Insufficient documentation

## 2022-07-17 DIAGNOSIS — S0993XA Unspecified injury of face, initial encounter: Secondary | ICD-10-CM | POA: Diagnosis not present

## 2022-07-17 DIAGNOSIS — W19XXXA Unspecified fall, initial encounter: Secondary | ICD-10-CM | POA: Diagnosis not present

## 2022-07-17 DIAGNOSIS — S3993XA Unspecified injury of pelvis, initial encounter: Secondary | ICD-10-CM | POA: Diagnosis not present

## 2022-07-17 DIAGNOSIS — M19031 Primary osteoarthritis, right wrist: Secondary | ICD-10-CM | POA: Diagnosis not present

## 2022-07-17 DIAGNOSIS — M7989 Other specified soft tissue disorders: Secondary | ICD-10-CM | POA: Diagnosis not present

## 2022-07-17 DIAGNOSIS — S0083XA Contusion of other part of head, initial encounter: Secondary | ICD-10-CM | POA: Insufficient documentation

## 2022-07-17 DIAGNOSIS — Z743 Need for continuous supervision: Secondary | ICD-10-CM | POA: Diagnosis not present

## 2022-07-17 DIAGNOSIS — F0390 Unspecified dementia without behavioral disturbance: Secondary | ICD-10-CM | POA: Diagnosis not present

## 2022-07-17 DIAGNOSIS — R6889 Other general symptoms and signs: Secondary | ICD-10-CM | POA: Diagnosis not present

## 2022-07-17 DIAGNOSIS — S8992XA Unspecified injury of left lower leg, initial encounter: Secondary | ICD-10-CM | POA: Diagnosis not present

## 2022-07-17 LAB — CBC WITH DIFFERENTIAL/PLATELET
Abs Immature Granulocytes: 0.05 10*3/uL (ref 0.00–0.07)
Basophils Absolute: 0.1 10*3/uL (ref 0.0–0.1)
Basophils Relative: 1 %
Eosinophils Absolute: 0.2 10*3/uL (ref 0.0–0.5)
Eosinophils Relative: 1 %
HCT: 39.5 % (ref 36.0–46.0)
Hemoglobin: 14.3 g/dL (ref 12.0–15.0)
Immature Granulocytes: 0 %
Lymphocytes Relative: 15 %
Lymphs Abs: 1.9 10*3/uL (ref 0.7–4.0)
MCH: 32.9 pg (ref 26.0–34.0)
MCHC: 36.2 g/dL — ABNORMAL HIGH (ref 30.0–36.0)
MCV: 91 fL (ref 80.0–100.0)
Monocytes Absolute: 1 10*3/uL (ref 0.1–1.0)
Monocytes Relative: 7 %
Neutro Abs: 9.7 10*3/uL — ABNORMAL HIGH (ref 1.7–7.7)
Neutrophils Relative %: 76 %
Platelets: 192 10*3/uL (ref 150–400)
RBC: 4.34 MIL/uL (ref 3.87–5.11)
RDW: 11.9 % (ref 11.5–15.5)
WBC: 12.9 10*3/uL — ABNORMAL HIGH (ref 4.0–10.5)
nRBC: 0 % (ref 0.0–0.2)

## 2022-07-17 MED ORDER — ACETAMINOPHEN 500 MG PO TABS
1000.0000 mg | ORAL_TABLET | Freq: Once | ORAL | Status: AC
  Administered 2022-07-18: 1000 mg via ORAL
  Filled 2022-07-17: qty 2

## 2022-07-17 MED ORDER — METHOCARBAMOL 500 MG PO TABS
500.0000 mg | ORAL_TABLET | Freq: Once | ORAL | Status: AC
  Administered 2022-07-18: 500 mg via ORAL
  Filled 2022-07-17: qty 1

## 2022-07-17 MED ORDER — ONDANSETRON HCL 4 MG/2ML IJ SOLN
4.0000 mg | Freq: Once | INTRAMUSCULAR | Status: AC
  Administered 2022-07-18: 4 mg via INTRAVENOUS
  Filled 2022-07-17: qty 2

## 2022-07-17 MED ORDER — FENTANYL CITRATE PF 50 MCG/ML IJ SOSY
50.0000 ug | PREFILLED_SYRINGE | INTRAMUSCULAR | Status: DC | PRN
  Administered 2022-07-17: 50 ug via INTRAVENOUS
  Filled 2022-07-17: qty 1

## 2022-07-17 NOTE — ED Notes (Signed)
Xray at bedside for further imaging.

## 2022-07-17 NOTE — Progress Notes (Signed)
Orthopedic Tech Progress Note Patient Details:  Tonya [name] [birth year] [id number]  Ortho Devices Type of Ortho Device: Long arm splint, Shoulder immobilizer Ortho Device/Splint Location: lue Ortho Device/Splint Interventions: Ordered, Application, Adjustment  It was difficult applying splint pt kept moving and almost vomited twice.   Post Interventions Patient Tolerated: Poor  [name] Tonya [name] 07/17/2022, 11:03 PM

## 2022-07-17 NOTE — ED Provider Notes (Signed)
[facility] EMERGENCY DEPARTMENT Provider Note   CSN: [id number] Arrival date & time: 07/17/22  2009     History {Add pertinent medical, surgical, social history, OB history to HPI:1} Chief Complaint  Patient presents with   Tonya [name] is a 81 y.o. female.  Pt is a 81y/o female with hx of atrial fibrillation on eliquis, chronic kidney disease, controlled type 2 diabetes, coronary artery disease and dementia with frequent falls at home who is presenting today with EMS after a fall. Pt was not using her cane or walking and was walking out of the garage and missed the step and fell forward onto the ground.  She hit her face and left elbow/hand on the ground as well as the left knee.  She denies any LOC but did have a nose bleed out of the right nare which has now stopped.  She was given 12mg of fentanyl enroute due to severe left elbow pain.  She denies chest or abd pain and has no SOB, palpitations or n/v.  The history is provided by the patient, the EMS personnel and a caregiver.  Fall       Home Medications Prior to Admission medications   Medication Sig Start Date End Date Taking? Authorizing Provider  atorvastatin (LIPITOR) 20 MG tablet TAKE 1 TABLET BY MOUTH EVERY DAY 01/30/22   HMinus Breeding MD  Blood Glucose Monitoring Suppl (ACCU-CHEK AVIVA PLUS) w/Device KIT Use to check blood once sugar daily. 03/14/18   Bedsole, Amy E, MD  Calcium Carbonate-Vit D-Min (CALCIUM 1200 PO) Take by mouth. 1 daily    [provider]  carvedilol (COREG) 12.5 MG tablet TAKE 1 TABLET (12.5MG TOTAL) BY MOUTH TWICE A DAY WITH MEALS 11/02/21   Bedsole, Amy E, MD  cetirizine (ZYRTEC) 10 MG tablet Take 10 mg by mouth at bedtime.    [provider]  cholestyramine light (PREVALITE) 4 g packet USE 1 PACKET TWICE DAILY WITH A MEAL, AT LEAST 2 HOURS AWAY FROM OTHER MEDS PATIENT NEEDS OFFICE VISIT 04/20/22   JMilus Banister MD  citalopram (CELEXA) 40 MG  tablet TAKE 1 TABLET BY MOUTH EVERY DAY 07/16/22   Bedsole, Amy E, MD  diltiazem (CARDIZEM CD) 180 MG 24 hr capsule TAKE 1 CAPSULE BY MOUTH EVERY DAY Patient taking differently: Take 180 mg by mouth daily. 11/02/21   Bedsole, Amy E, MD  ELIQUIS 2.5 MG TABS tablet TAKE 1 TABLET BY MOUTH TWICE A DAY Patient taking differently: Take 2.5 mg by mouth 2 (two) times daily. 11/02/21   Bedsole, Amy E, MD  ferrous sulfate 325 (65 FE) MG EC tablet Take 325 mg by mouth 2 (two) times daily.    Bedsole, Amy E, MD  glucose blood (ACCU-CHEK AVIVA PLUS) test strip Use to check blood sugar daily. 03/14/18   Bedsole, Amy E, MD  isosorbide mononitrate (IMDUR) 30 MG 24 hr tablet TAKE 1 TABLET BY MOUTH EVERY DAY Patient taking differently: Take 30 mg by mouth daily. 11/03/21   HMinus Breeding MD  losartan (COZAAR) 100 MG tablet TAKE 1 TABLET BY MOUTH EVERY DAY 04/06/22   HMinus Breeding MD  Magnesium Gluconate (MAGNESIUM 27 PO) Take by mouth. 1 daily    [provider]  omeprazole (PRILOSEC) 20 MG capsule TAKE 1 CAPSULE BY MOUTH EVERY DAY Patient taking differently: Take 20 mg by mouth daily. 11/02/21   Bedsole, Amy E, MD  ondansetron (ZOFRAN-ODT) 4 MG disintegrating tablet Take 1 tablet (4  mg total) by mouth every 8 (eight) hours as needed. 12/23/21   Smith, Dylan, MD  Vitamin D, Ergocalciferol, (DRISDOL) 1.25 MG (50000 UNIT) CAPS capsule TAKE 1 CAPSULE (50,000 UNITS TOTAL) BY MOUTH EVERY 7 (SEVEN) DAYS 04/29/22   Bedsole, Amy E, MD      Allergies    Amoxicillin-pot clavulanate and Nitrofurantoin    Review of Systems   Review of Systems  Physical Exam Updated Vital Signs BP (!) 158/90   Pulse (!) 117   Temp 98 F (36.7 C)   Resp 16   Ht 5' 5" (1.651 m)   Wt 80 kg   SpO2 94%   BMI 29.35 kg/m  Physical Exam Vitals and nursing note reviewed.  Constitutional:      General: She is not in acute distress.    Appearance: She is well-developed.  HENT:     Head: Normocephalic and atraumatic.     Nose:      Comments: Dried blood in the right nare without active epistaxis.  No septal hematoma Eyes:     Pupils: Pupils are equal, round, and reactive to light.  Cardiovascular:     Rate and Rhythm: Regular rhythm. Tachycardia present.     Heart sounds: Normal heart sounds. No murmur heard.    No friction rub.  Pulmonary:     Effort: Pulmonary effort is normal.     Breath sounds: Normal breath sounds. No wheezing or rales.  Abdominal:     General: Bowel sounds are normal. There is no distension.     Palpations: Abdomen is soft.     Tenderness: There is no abdominal tenderness. There is no guarding or rebound.  Musculoskeletal:        General: Tenderness present.     Left elbow: Decreased range of motion. Tenderness present.     Cervical back: Neck supple. No tenderness.     Thoracic back: Normal.     Lumbar back: Normal.     Right hip: Normal.     Left hip: Normal.     Left knee: Ecchymosis present. No swelling. Normal range of motion. Tenderness present over the patellar tendon.     Right ankle: Normal.     Left ankle: Normal.     Comments: No edema.  Left elbow is held in flexion patient will not extend.  Mild tenderness with palpation of the mid hand but no obvious deformities.  No left-sided wrist pain or shoulder pain.  Skin:    General: Skin is warm and dry.     Findings: No rash.  Neurological:     Mental Status: She is alert and oriented to person, place, and time. Mental status is at baseline.     Cranial Nerves: No cranial nerve deficit.  Psychiatric:        Mood and Affect: Mood normal.        Behavior: Behavior normal.     ED Results / Procedures / Treatments   Labs (all labs ordered are listed, but only abnormal results are displayed) Labs Reviewed  CBC WITH DIFFERENTIAL/PLATELET    EKG None  Radiology DG Chest Port 1 View  Result Date: 07/17/2022 CLINICAL DATA:  Trauma EXAM: PORTABLE CHEST 1 VIEW COMPARISON:  12/25/2021 FINDINGS: Reverse right shoulder  replacement. No acute airspace disease. Possible tiny left effusion. Mild cardiomegaly. No pneumothorax. Old fracture deformity of the right clavicle IMPRESSION: No active disease.  Mild cardiomegaly Electronically Signed   By: Kim  Fujinaga M.D.   On: 07/17/2022   20:31    Procedures Procedures  {Document cardiac monitor, telemetry assessment procedure when appropriate:1}  Medications Ordered in ED Medications  fentaNYL (SUBLIMAZE) injection 50 mcg (has no administration in time range)    ED Course/ Medical Decision Making/ A&P                           Medical Decision Making Amount and/or Complexity of Data Reviewed Labs: ordered. Radiology: ordered.  Risk Prescription drug management.   Pt with multiple medical problems and comorbidities and presenting today with a complaint that caries a high risk for morbidity and mortality.  Here today with another fall.  Patient was not using her cane or walker and fell face first.  Significant pain over the left elbow with prior rod in that elbow and concern for recurrent fracture.  Patient is on Eliquis and had a nosebleed and based on the camera her daughter had does appear to have hit her head.  She is mentating normally at this time and we will do a head CT to ensure no evidence of intracranial bleeding.  Patient is also having left knee pain.  She is able to range the hips with no tenderness.  She is neurovascularly intact at this time.  Patient given pain control and imaging is pending.  Low suspicion for an acute cardiac or respiratory cause for her fall today.  Feel that it was related to her balance and not using her assist device.  Patient's daughter is present and corroborates this history.   {Document critical care time when appropriate:1} {Document review of labs and clinical decision tools ie heart score, Chads2Vasc2 etc:1}  {Document your independent review of radiology images, and any outside records:1} {Document your discussion  with family members, caretakers, and with consultants:1} {Document social determinants of health affecting pt's care:1} {Document your decision making why or why not admission, treatments were needed:1} Final Clinical Impression(s) / ED Diagnoses Final diagnoses:  None    Rx / DC Orders ED Discharge Orders     None       

## 2022-07-17 NOTE — ED Triage Notes (Signed)
Pt arrives via [facility] from home, Pt normally uses a walker, was not today. She has a Deformity to the left elbow, fell forward and hit her hit face, no LOC. Eliquis. Hx of dementia. 100 hr afib, 170/100, 10/10 50 fentanyl given to the in the right [facility]. Nosebleed has subsided. C collar and LUE splint placed prior to arrival.

## 2022-07-17 NOTE — ED Notes (Signed)
Trauma Response Nurse Documentation   Tonya [name] is a 81 y.o. female arriving to [facility] ED via [facility]  On No antithrombotic. Trauma was activated as a Level 2 by Rolanda Jay based on the following trauma criteria Elderly patients > 65 with head trauma on anti-coagulation (excluding ASA). Trauma team at the bedside on patient arrival.   Patient cleared for CT by Dr. Maryan Rued. Pt transported to CT with trauma response nurse present to monitor. RN remained with the patient throughout their absence from the department for clinical observation.   GCS 15.  History   Past Medical History:  Diagnosis Date   Adenocarcinoma, colon ([place])    Anemia    iron deficiency   Anxiety    Carpal tunnel syndrome    Cataract    REMOVED BILATERAL   Chronic diarrhea    Colon polyps    Tubular adenoma   Degenerative disk disease    Depression    Diabetes mellitus, type 2 ([place])    Dysrhythmia    h/o AFIB   GERD (gastroesophageal reflux disease)    History of blood transfusion    After surgeries   Hyperlipidemia    Hypertension    IBS (irritable bowel syndrome)    Kidney disorder    Osteoarthritis    Osteoporosis    PONV (postoperative nausea and vomiting)    S/P coronary artery stent placement 2007     Past Surgical History:  Procedure Laterality Date   CARDIAC CATHETERIZATION  12/10   LAD stent patent. Insignificant CAD, otherwise EF 60-65%   CARPAL TUNNEL RELEASE     CATARACT EXTRACTION  03/2015   CHOLECYSTECTOMY     COLON SURGERY     COLONOSCOPY     DILATION AND CURETTAGE OF UTERUS     HEMATOMA EVACUATION     after knee replacement   KNEE ARTHROSCOPY     bilateral   LUMBAR LAMINECTOMY N/A 11/26/2015   Procedure: LUMBAR DECOMPRESSIVE LAMINECTOMY L3-4 AND L4-5, LEFT L2-3 HEMILAMINECTOMY;  Surgeon: Jessy Oto, MD;  Location: [facility];  Service: Orthopedics;  Laterality: N/A;   ORIF ELBOW FRACTURE Left 03/18/2021   Procedure: OPEN REDUCTION INTERNAL FIXATION  (ORIF) ELBOW/OLECRANON FRACTURE;  Surgeon: Renette Butters, MD;  Location: [facility];  Service: Orthopedics;  Laterality: Left;   PARTIAL COLECTOMY  11/2008   right, for adenocarcinoma   REPLACEMENT TOTAL KNEE  01/4626   right, complicated by hemarthrosis    REVERSE SHOULDER ARTHROPLASTY Right 05/28/2016   REVERSE SHOULDER ARTHROPLASTY Right 05/28/2016   Procedure: REVERSE SHOULDER ARTHROPLASTY;  Surgeon: Meredith Pel, MD;  Location: [facility];  Service: Orthopedics;  Laterality: Right;   TONSILLECTOMY     TOTAL KNEE ARTHROPLASTY Left 04/03/2014   dr whitfield   TOTAL KNEE ARTHROPLASTY Left 04/03/2014   Procedure: TOTAL KNEE ARTHROPLASTY;  Surgeon: Garald Balding, MD;  Location: [facility];  Service: Orthopedics;  Laterality: Left;   TOTAL KNEE REVISION  03/08/2012   Procedure: TOTAL KNEE REVISION;  Surgeon: Garald Balding, MD;  Location: [facility];  Service: Orthopedics;  Laterality: Right;  removal total knee hardware and placement of antibiotic cement spacer and antibiotic beads   TOTAL KNEE REVISION  05/17/2012   Procedure: TOTAL KNEE REVISION;  Surgeon: Garald Balding, MD;  Location: [facility];  Service: Orthopedics;  Laterality: Right;  right total knee revision, removal of antibiotic spacer       Initial Focused Assessment (If applicable, or please see trauma  documentation): Airway-- intact, no visible obstruction Breathing-- spontaneous, unlabored Circulation-- nose bleed prior to arrival, bleeding stopped upon arrival  CT's Completed:   CT Head, CT Maxillofacial, and CT C-Spine   Interventions:  See event summary  Plan for disposition:  Unknown at this time.  Consults completed:  none at 2039.  Event Summary: Patient brought in by [facility], patient had a mechanical fall this evening, falling forward hitting her face. Unwitnessed fall, unsure  what patient struck her face on. Patient alert and oriented upon arrival. Patient with pain to left elbow, left shoulder, and  left knee. Patient had nose bleed as well that stopped prior to arrival. Vital signs stable. Trauma labs obtained. Xray chest and pelvis completed. CT head, c-spine, maxillofacial completed. Patient transported to and from CT by TRN. TRN remained with patient during imaging.   Bedside handoff with ED RN Claiborne Billings.    Trudee Kuster  Trauma Response RN  Please call TRN at [phone] for further assistance.

## 2022-07-18 MED ORDER — METHOCARBAMOL 500 MG PO TABS: 500.0000 mg | ORAL_TABLET | Freq: Two times a day (BID) | ORAL | 0 refills | Status: DC

## 2022-07-18 NOTE — Discharge Instructions (Signed)
Thankfully on the x-rays today there does not appear to be any new broken bones.  Hopefully you have just bruised everything and it will improve but because you are having so much pain in your elbow we put the splint on just for protection in case there is a small fracture.  Do not put any weight on that arm until you are cleared by Dr. Percell Miller.  You can use the muscle relaxer twice a day as needed but also extra strength Tylenol 4 times a day for pain.  Also Voltaren gel may be useful for other aches and pains.  Just rub it where it hurts and you can get that over-the-counter.

## 2022-07-21 ENCOUNTER — Ambulatory Visit (INDEPENDENT_AMBULATORY_CARE_PROVIDER_SITE_OTHER): Payer: Medicare Other | Admitting: Family Medicine

## 2022-07-21 VITALS — BP 120/62 | HR 70 | Temp 97.4°F | Ht 65.0 in | Wt 158.1 lb

## 2022-07-21 DIAGNOSIS — W19XXXD Unspecified fall, subsequent encounter: Secondary | ICD-10-CM | POA: Diagnosis not present

## 2022-07-21 DIAGNOSIS — E1121 Type 2 diabetes mellitus with diabetic nephropathy: Secondary | ICD-10-CM | POA: Diagnosis not present

## 2022-07-21 DIAGNOSIS — W19XXXA Unspecified fall, initial encounter: Secondary | ICD-10-CM | POA: Insufficient documentation

## 2022-07-21 DIAGNOSIS — Z789 Other specified health status: Secondary | ICD-10-CM | POA: Insufficient documentation

## 2022-07-21 DIAGNOSIS — L602 Onychogryphosis: Secondary | ICD-10-CM | POA: Insufficient documentation

## 2022-07-21 DIAGNOSIS — M25522 Pain in left elbow: Secondary | ICD-10-CM | POA: Insufficient documentation

## 2022-07-21 DIAGNOSIS — F03B18 Unspecified dementia, moderate, with other behavioral disturbance: Secondary | ICD-10-CM | POA: Diagnosis not present

## 2022-07-21 NOTE — Assessment & Plan Note (Signed)
Chronic, with recent acute worsening of mental status per daughter since her fall and resulting injury.  No clear new focal changes on exam and no headaches suggesting intracranial bleed.  Initial CT of head on day of fall was negative. Return precautions and ER precautions provided to daughter.

## 2022-07-21 NOTE — Assessment & Plan Note (Addendum)
Now that she needs 24 hour care and supervision.Marland Kitchen daughter is looking  into  memory care nursing home care.  She spoke with [facility] in past.. 5.2023 but now her care requirements have increased and she needs more supervision that assisted living.    I will forward this note tot the care guide for further assistance for placement.  I may need to put in a new referral for social work.

## 2022-07-21 NOTE — Assessment & Plan Note (Signed)
Chronic, stable No clear hypoglycemia causing fall.

## 2022-07-21 NOTE — Assessment & Plan Note (Signed)
Following accidental fall.  History of surgical treatment of displaced fracture of olecranon process without intraarticular extension of left ulna in 2022.  Has follow-up tomorrow with Dr. Percell Miller the surgeon that performed the repair.  Her pain is fairly controlled with Tylenol.  Her daughter is also giving her Robaxin but I do have some concerns that this could be contributing to her change in mental status and I have encouraged her to stop this if Dr. Percell Miller agrees.

## 2022-07-21 NOTE — Progress Notes (Signed)
Patient ID: Tonya [name], female    DOB: 09/01/1941, 81 y.o.   MRN: [id number]  This visit was conducted in person.  BP 120/62   Pulse 70   Temp (!) 97.4 F (36.3 C) (Temporal)   Ht _0  (1.651 m)   Wt 158 lb 2 oz (71.7 kg)   SpO2 97%   BMI 26.31 kg/m    CC:  Chief Complaint  Patient presents with   Hospitalization Follow-up    Subjective:   HPI: Tonya [name] is a 80 y.o. female with history of atrial fibrillation on Eliquis, chronic kidney disease, controlled type 2 diabetes, coronary artery disease and dementia with frequent falls presenting on 07/21/2022 for Hospitalization Follow-up  ER visit without admission on July 17, 2022.  Patient was not using her cane and was walking out of the garage and missed a step and fell forward onto the ground.  She hit her face and left elbow/hand and left knee.  No loss of chronic dizziness but did have a nosebleed.  Head CT today is negative for any acute bleed, cervical spine without acute fracture, wrist, hand, knee imaging all negative for fracture.  Elbow imaging with wires in place.  Radiology reports no obvious fracture in the left hand, right wrist, left knee.  Left elbow shows no acute fracture or joint effusion.  She does have 2 K wires that traverse the olecranon and project 5 mm into the posterior soft tissue.  There is no definite fracture line seen.  CT of the head and maxillofacial are negative for acute pathology the patient has a known saccular aneurysm that is unchanged.  Cervical spine shows significant arthritis without evidence of fractures or traumatic listhesis.  There is mass effect on the cord surface due to protruding disc at C2-3 through C4-5 due to posterior disc osteophytes however patient is having no difficulty raising her arms denies any numbness or tingling going down the arms and has had no new issues with balance or walking.  She has no weakness in her legs.     Treated with muscle relaxant and  Tylenol.      Today she reports:  Appears to have been an accidental fall.   Had  elbow fracture last year  Has appt with Dr. Percell Miller tomorrow.  She continues to have pain in left elbow  Pain fairly well controlled unless she is  tying to move it.  No neck pain, bruising on face.  Per her daughter she continues to be very confused. More so than usual since she has fallen.  Seems to continue to worsen further from the fall. No headache.   No clear association with confusion and dosing of muscle relaxant.     Now that she needs 24 hour care and supervision.Marland Kitchen daughter is looking  into  memory care nursing home care.  She spoke with [facility] in past.. 5.2023 but now her care requirements have increased and she needs more supervision that assisted living.   Relevant past medical, surgical, family and social history reviewed and updated as indicated. Interim medical history since our last visit reviewed. Allergies and medications reviewed and updated. Outpatient Medications Prior to Visit  Medication Sig Dispense Refill   atorvastatin (LIPITOR) 20 MG tablet TAKE 1 TABLET BY MOUTH EVERY DAY 90 tablet 1   Blood Glucose Monitoring Suppl (ACCU-CHEK AVIVA PLUS) w/Device KIT Use to check blood once sugar daily. 1 kit 0   Calcium Carbonate-Vit D-Min (CALCIUM  1200 PO) Take by mouth. 1 daily     carvedilol (COREG) 12.5 MG tablet TAKE 1 TABLET (12.5MG TOTAL) BY MOUTH TWICE A DAY WITH MEALS 180 tablet 3   cetirizine (ZYRTEC) 10 MG tablet Take 10 mg by mouth at bedtime.     cholestyramine light (PREVALITE) 4 g packet USE 1 PACKET TWICE DAILY WITH A MEAL, AT LEAST 2 HOURS AWAY FROM OTHER MEDS PATIENT NEEDS OFFICE VISIT 540 packet 3   citalopram (CELEXA) 40 MG tablet TAKE 1 TABLET BY MOUTH EVERY DAY 90 tablet 1   diltiazem (CARDIZEM CD) 180 MG 24 hr capsule TAKE 1 CAPSULE BY MOUTH EVERY DAY (Patient taking differently: Take 180 mg by mouth daily.) 90 capsule 3   ELIQUIS 2.5 MG TABS tablet TAKE 1  TABLET BY MOUTH TWICE A DAY (Patient taking differently: Take 2.5 mg by mouth 2 (two) times daily.) 60 tablet 11   ferrous sulfate 325 (65 FE) MG EC tablet Take 325 mg by mouth 2 (two) times daily.     glucose blood (ACCU-CHEK AVIVA PLUS) test strip Use to check blood sugar daily. 100 each 3   isosorbide mononitrate (IMDUR) 30 MG 24 hr tablet TAKE 1 TABLET BY MOUTH EVERY DAY (Patient taking differently: Take 30 mg by mouth daily.) 90 tablet 2   losartan (COZAAR) 100 MG tablet TAKE 1 TABLET BY MOUTH EVERY DAY 90 tablet 3   Magnesium Gluconate (MAGNESIUM 27 PO) Take 2 each by mouth daily.     methocarbamol (ROBAXIN) 500 MG tablet Take 1 tablet (500 mg total) by mouth 2 (two) times daily. 20 tablet 0   omeprazole (PRILOSEC) 20 MG capsule TAKE 1 CAPSULE BY MOUTH EVERY DAY (Patient taking differently: Take 20 mg by mouth daily.) 90 capsule 3   ondansetron (ZOFRAN-ODT) 4 MG disintegrating tablet Take 1 tablet (4 mg total) by mouth every 8 (eight) hours as needed. 20 tablet 0   Vitamin D, Ergocalciferol, (DRISDOL) 1.25 MG (50000 UNIT) CAPS capsule TAKE 1 CAPSULE (50,000 UNITS TOTAL) BY MOUTH EVERY 7 (SEVEN) DAYS 12 capsule 0   No facility-administered medications prior to visit.     Per HPI unless specifically indicated in ROS section below Review of Systems Objective:  BP 120/62   Pulse 70   Temp (!) 97.4 F (36.3 C) (Temporal)   Ht _0  (1.651 m)   Wt 158 lb 2 oz (71.7 kg)   SpO2 97%   BMI 26.31 kg/m   Wt Readings from Last 3 Encounters:  07/21/22 158 lb 2 oz (71.7 kg)  07/17/22 176 lb 5.9 oz (80 kg)  06/26/22 156 lb 6 oz (70.9 kg)      Physical Exam    Results for orders placed or performed during the hospital encounter of 07/17/22  CBC WITH DIFFERENTIAL  Result Value Ref Range   WBC 12.9 (H) 4.0 - 10.5 K/uL   RBC 4.34 3.87 - 5.11 MIL/uL   Hemoglobin 14.3 12.0 - 15.0 g/dL   HCT 39.5 36.0 - 46.0 %   MCV 91.0 80.0 - 100.0 fL   MCH 32.9 26.0 - 34.0 pg   MCHC 36.2 (H) 30.0 - 36.0  g/dL   RDW 11.9 11.5 - 15.5 %   Platelets 192 150 - 400 K/uL   nRBC 0.0 0.0 - 0.2 %   Neutrophils Relative % 76 %   Neutro Abs 9.7 (H) 1.7 - 7.7 K/uL   Lymphocytes Relative 15 %   Lymphs Abs 1.9 0.7 - 4.0 K/uL  Monocytes Relative 7 %   Monocytes Absolute 1.0 0.1 - 1.0 K/uL   Eosinophils Relative 1 %   Eosinophils Absolute 0.2 0.0 - 0.5 K/uL   Basophils Relative 1 %   Basophils Absolute 0.1 0.0 - 0.1 K/uL   Immature Granulocytes 0 %   Abs Immature Granulocytes 0.05 0.00 - 0.07 K/uL     COVID 19 screen:  No recent travel or known exposure to [place] The patient denies respiratory symptoms of COVID 19 at this time. The importance of social distancing was discussed today.   Assessment and Plan Problem List Items Addressed This Visit     Controlled type 2 diabetes mellitus with diabetic nephropathy (HCC) (Chronic)    Chronic, stable No clear hypoglycemia causing fall.      Relevant Orders   Ambulatory referral to Podiatry   Accidental fall - Primary    She has had an increasing number of accidental falls given her inability to make good judgments in the setting of her dementia.  Her family can also not supervise her 24 hours a day. We did review fall safety.      Left elbow pain    Following accidental fall.  History of surgical treatment of displaced fracture of olecranon process without intraarticular extension of left ulna in 2022. Has follow-up tomorrow with Dr. Percell Miller the surgeon that performed the repair. Her pain is fairly controlled with Tylenol.  Her daughter is also giving her Robaxin but I do have some concerns that this could be contributing to her change in mental status and I have encouraged her to stop this if Dr. Percell Miller agrees.      Moderate dementia ([place])    Chronic, with recent acute worsening of mental status per daughter since her fall and resulting injury.  No clear new focal changes on exam and no headaches suggesting intracranial bleed.  Initial CT of  head on day of fall was negative. Return precautions and ER precautions provided to daughter.      Need for follow-up by social worker     Now that she needs 24 hour care and supervision.Marland Kitchen daughter is looking  into  memory care nursing home care.  She spoke with [facility] in past.. 5.2023 but now her care requirements have increased and she needs more supervision that assisted living.    I will forward this note tot the care guide for further assistance for placement.  I may need to put in a new referral for social work.      Thickened nails    In setting of diabetes, refer to podiatry for nail care.      Relevant Orders   Ambulatory referral to Podiatry       [name], MD

## 2022-07-21 NOTE — Assessment & Plan Note (Signed)
She has had an increasing number of accidental falls given her inability to make good judgments in the setting of her dementia.  Her family can also not supervise her 24 hours a day. We did review fall safety.

## 2022-07-21 NOTE — Patient Instructions (Addendum)
Consider stopping muscle relaxant once evalutred by D.r Percell Miller given concerns of  worsening mental status.  Let me know if her mental status change further or any focal neuro changes, headache etc. For possible head CT.  Podiatry referral pending.  I will get you back in touch with [facility], Care Management  [phone] [address], [place], [place] [address] Phone: [phone] Email: [email or web address]

## 2022-07-21 NOTE — Assessment & Plan Note (Signed)
In setting of diabetes, refer to podiatry for nail care.

## 2022-07-22 ENCOUNTER — Telehealth: Payer: Self-pay | Admitting: *Deleted

## 2022-07-22 DIAGNOSIS — S52022D Displaced fracture of olecranon process without intraarticular extension of left ulna, subsequent encounter for closed fracture with routine healing: Secondary | ICD-10-CM | POA: Diagnosis not present

## 2022-07-22 NOTE — Telephone Encounter (Signed)
   Pre-operative Risk Assessment    Patient Name: Tonya [name]  DOB: 08/02/1941 MRN: [id number]      Request for Surgical Clearance    Procedure:   ORIF LEFT ELBOW  Date of Surgery:  Clearance 07/28/22                                 Surgeon:  DR. Edmonia Lynch Surgeon's Group or Practice Name:  [facility] Phone number:  [phone] Fax number:  [phone]   Type of Clearance Requested:   - Medical  - Pharmacy:  Hold Apixaban (Eliquis)     Type of Anesthesia:  General    Additional requests/questions:    Jiles Prows   07/22/2022, 5:43 PM

## 2022-07-22 NOTE — H&P (Signed)
PREOPERATIVE H&P  Chief Complaint: LEFT ELBOW FRACTURE WITH LIGAMENT TEAR  HPI: Tonya [name] is a 81 y.o. female who presents with a diagnosis of LEFT ELBOW FRACTURE WITH LIGAMENT TEAR. Symptoms are rated as moderate to severe, and have been worsening.  This is significantly impairing activities of daily living.  She has elected for surgical management.   Past Medical History:  Diagnosis Date   Adenocarcinoma, colon ([place])    Anemia    iron deficiency   Anxiety    Carpal tunnel syndrome    Cataract    REMOVED BILATERAL   Chronic diarrhea    Colon polyps    Tubular adenoma   Degenerative disk disease    Depression    Diabetes mellitus, type 2 ([place])    Dysrhythmia    h/o AFIB   GERD (gastroesophageal reflux disease)    History of blood transfusion    After surgeries   Hyperlipidemia    Hypertension    IBS (irritable bowel syndrome)    Kidney disorder    Osteoarthritis    Osteoporosis    PONV (postoperative nausea and vomiting)    S/P coronary artery stent placement 2007   Past Surgical History:  Procedure Laterality Date   CARDIAC CATHETERIZATION  12/10   LAD stent patent. Insignificant CAD, otherwise EF 60-65%   CARPAL TUNNEL RELEASE     CATARACT EXTRACTION  03/2015   CHOLECYSTECTOMY     COLON SURGERY     COLONOSCOPY     DILATION AND CURETTAGE OF UTERUS     HEMATOMA EVACUATION     after knee replacement   KNEE ARTHROSCOPY     bilateral   LUMBAR LAMINECTOMY N/A 11/26/2015   Procedure: LUMBAR DECOMPRESSIVE LAMINECTOMY L3-4 AND L4-5, LEFT L2-3 HEMILAMINECTOMY;  Surgeon: Jessy Oto, MD;  Location: [facility];  Service: Orthopedics;  Laterality: N/A;   ORIF ELBOW FRACTURE Left 03/18/2021   Procedure: OPEN REDUCTION INTERNAL FIXATION (ORIF) ELBOW/OLECRANON FRACTURE;  Surgeon: Renette Butters, MD;  Location: [facility];  Service: Orthopedics;  Laterality: Left;   PARTIAL COLECTOMY  11/2008   right, for adenocarcinoma   REPLACEMENT TOTAL KNEE  04/7680   right, complicated by  hemarthrosis    REVERSE SHOULDER ARTHROPLASTY Right 05/28/2016   REVERSE SHOULDER ARTHROPLASTY Right 05/28/2016   Procedure: REVERSE SHOULDER ARTHROPLASTY;  Surgeon: Meredith Pel, MD;  Location: [facility];  Service: Orthopedics;  Laterality: Right;   TONSILLECTOMY     TOTAL KNEE ARTHROPLASTY Left 04/03/2014   dr whitfield   TOTAL KNEE ARTHROPLASTY Left 04/03/2014   Procedure: TOTAL KNEE ARTHROPLASTY;  Surgeon: Garald Balding, MD;  Location: [facility];  Service: Orthopedics;  Laterality: Left;   TOTAL KNEE REVISION  03/08/2012   Procedure: TOTAL KNEE REVISION;  Surgeon: Garald Balding, MD;  Location: [facility];  Service: Orthopedics;  Laterality: Right;  removal total knee hardware and placement of antibiotic cement spacer and antibiotic beads   TOTAL KNEE REVISION  05/17/2012   Procedure: TOTAL KNEE REVISION;  Surgeon: Garald Balding, MD;  Location: [facility];  Service: Orthopedics;  Laterality: Right;  right total knee revision, removal of antibiotic spacer   Social History   Socioeconomic History   Marital status: Divorced    Spouse name: Not on file   Number of children: 3   Years of education: Not on file   Highest education level: Not on file  Occupational History   Occupation: retired    Fish farm manager: RETIRED  Tobacco Use  Smoking status: Never   Smokeless tobacco: Never  Vaping Use   Vaping Use: Never used  Substance and Sexual Activity   Alcohol use: No    Alcohol/week: 0.0 standard drinks of alcohol   Drug use: No   Sexual activity: Never  Other Topics Concern   Not on file  Social History Narrative   No regular exercise, limited due to knees   Diet- addicted to sweets, some fruit and veggies, water daily.   Social Determinants of Health   Financial Resource Strain: Low Risk  (08/14/2021)   Overall Financial Resource Strain (CARDIA)    Difficulty of Paying Living Expenses: Not hard at all  Food Insecurity: No Food Insecurity (08/14/2021)   Hunger Vital Sign    Worried  About Running Out of Food in the Last Year: Never true    Ran Out of Food in the Last Year: Never true  Transportation Needs: No Transportation Needs (08/14/2021)   PRAPARE - Hydrologist (Medical): No    Lack of Transportation (Non-Medical): No  Physical Activity: Inactive (08/14/2021)   Exercise Vital Sign    Days of Exercise per Week: 0 days    Minutes of Exercise per Session: 0 min  Stress: No Stress Concern Present (08/14/2021)   [place]    Feeling of Stress : Not at all  Social Connections: Moderately Isolated (08/14/2021)   Social Connection and Isolation Panel [NHANES]    Frequency of Communication with Friends and Family: Once a week    Frequency of Social Gatherings with Friends and Family: Twice a week    Attends Religious Services: More than 4 times per year    Active Member of [employer] or Organizations: No    Attends Archivist Meetings: Never    Marital Status: Widowed   Family History  Problem Relation Age of Onset   Heart disease Mother        massive MI age 19   Heart attack Mother    Heart disease Father        Massive MI age 49   Prostate cancer Father    Colon polyps Sister    Diabetes Paternal Grandmother    Colon cancer Neg Hx    Esophageal cancer Neg Hx    Pancreatic cancer Neg Hx    Stomach cancer Neg Hx    Liver disease Neg Hx    Breast cancer Neg Hx    Allergies  Allergen Reactions   Amoxicillin-Pot Clavulanate Swelling and Other (See Comments)    Augmentin per patient, tongue swelling   Nitrofurantoin Diarrhea    Nausea, jaundice, liver enzymes abnormal.   Prior to Admission medications   Medication Sig Start Date End Date Taking? Authorizing Provider  atorvastatin (LIPITOR) 20 MG tablet TAKE 1 TABLET BY MOUTH EVERY DAY 01/30/22   Minus Breeding, MD  Blood Glucose Monitoring Suppl (ACCU-CHEK AVIVA PLUS) w/Device KIT Use to check blood once  sugar daily. 03/14/18   Bedsole, Amy E, MD  Calcium Carbonate-Vit D-Min (CALCIUM 1200 PO) Take by mouth. 1 daily    [provider]  carvedilol (COREG) 12.5 MG tablet TAKE 1 TABLET (12.5MG TOTAL) BY MOUTH TWICE A DAY WITH MEALS 11/02/21   Bedsole, Amy E, MD  cetirizine (ZYRTEC) 10 MG tablet Take 10 mg by mouth at bedtime.    [provider]  cholestyramine light (PREVALITE) 4 g packet USE 1 PACKET TWICE DAILY WITH A MEAL,  AT LEAST 2 HOURS AWAY FROM OTHER MEDS PATIENT NEEDS OFFICE VISIT 04/20/22   Milus Banister, MD  citalopram (CELEXA) 40 MG tablet TAKE 1 TABLET BY MOUTH EVERY DAY 07/16/22   Bedsole, Amy E, MD  diltiazem (CARDIZEM CD) 180 MG 24 hr capsule TAKE 1 CAPSULE BY MOUTH EVERY DAY Patient taking differently: Take 180 mg by mouth daily. 11/02/21   Bedsole, Amy E, MD  ELIQUIS 2.5 MG TABS tablet TAKE 1 TABLET BY MOUTH TWICE A DAY Patient taking differently: Take 2.5 mg by mouth 2 (two) times daily. 11/02/21   Bedsole, Amy E, MD  ferrous sulfate 325 (65 FE) MG EC tablet Take 325 mg by mouth 2 (two) times daily.    Bedsole, Amy E, MD  glucose blood (ACCU-CHEK AVIVA PLUS) test strip Use to check blood sugar daily. 03/14/18   Bedsole, Amy E, MD  isosorbide mononitrate (IMDUR) 30 MG 24 hr tablet TAKE 1 TABLET BY MOUTH EVERY DAY Patient taking differently: Take 30 mg by mouth daily. 11/03/21   Minus Breeding, MD  losartan (COZAAR) 100 MG tablet TAKE 1 TABLET BY MOUTH EVERY DAY 04/06/22   Minus Breeding, MD  Magnesium Gluconate (MAGNESIUM 27 PO) Take 2 each by mouth daily.    [provider]  methocarbamol (ROBAXIN) 500 MG tablet Take 1 tablet (500 mg total) by mouth 2 (two) times daily. 07/18/22   Blanchie Dessert, MD  omeprazole (PRILOSEC) 20 MG capsule TAKE 1 CAPSULE BY MOUTH EVERY DAY Patient taking differently: Take 20 mg by mouth daily. 11/02/21   Bedsole, Amy E, MD  ondansetron (ZOFRAN-ODT) 4 MG disintegrating tablet Take 1 tablet (4 mg total) by mouth every 8 (eight) hours  as needed. 12/23/21   Vladimir Crofts, MD  Vitamin D, Ergocalciferol, (DRISDOL) 1.25 MG (50000 UNIT) CAPS capsule TAKE 1 CAPSULE (50,000 UNITS TOTAL) BY MOUTH EVERY 7 (SEVEN) DAYS 04/29/22   Bedsole, Amy E, MD     Positive ROS: All other systems have been reviewed and were otherwise negative with the exception of those mentioned in the HPI and as above.  Physical Exam: General: Alert, no acute distress Cardiovascular: No pedal edema Respiratory: No cyanosis, no use of accessory musculature GI: No organomegaly, abdomen is soft and non-tender Skin: No lesions in the area of chief complaint Neurologic: Sensation intact distally Psychiatric: Patient is competent for consent with normal mood and affect Lymphatic: No axillary or cervical lymphadenopathy  MUSCULOSKELETAL: very TTP left elbow, minimal ROM, decreased strength, edema and ecchymosis present, NVI   Imaging: xrays show 2 K-wires traversing the olecranon, projecting 5 mm into the posterior soft tissues. Well corticated density adjacent to the lateral humeral epicondyle likely representing a fracture   Assessment: LEFT ELBOW FRACTURE WITH LIGAMENT TEAR  Plan: Plan for Procedure(s): OPEN REDUCTION INTERNAL FIXATION (ORIF) ELBOW/OLECRANON FRACTURE WITH LATERAL COLLATERAL LIGAMENT REPAIR AND PIN REMOVAL  The risks benefits and alternatives were discussed with the patient including but not limited to the risks of nonoperative treatment, versus surgical intervention including infection, bleeding, nerve injury,  blood clots, cardiopulmonary complications, morbidity, mortality, among others, and they were willing to proceed.   Weightbearing: NWB LUE Orthopedic devices: sling Showering: POD 3 Dressing: splint Medicines: restart Eliquis; Tramadol, Tylenol, Robaxin, Zofran  Discharge: POD 1 Follow up: 08/07/22 at 11:15am    Britt Bottom, PA-C Office [phone] 07/22/2022 1:45 PM

## 2022-07-23 ENCOUNTER — Telehealth: Payer: Self-pay

## 2022-07-23 ENCOUNTER — Telehealth: Payer: Self-pay | Admitting: *Deleted

## 2022-07-23 ENCOUNTER — Encounter (HOSPITAL_COMMUNITY): Payer: Self-pay | Admitting: Orthopedic Surgery

## 2022-07-23 NOTE — Telephone Encounter (Signed)
I s/w the pt's daughter Almyra Free [facility]) in regard to the need for a tele pre op appt for the pt. We have scheduled a tele pre op appt 07/24/22 @ 3 pm. Almyra Free states either she or her husband will be with the pt for the tele appt. Almyra Free states the pt has dementia. Almyra Free has confirmed medications and given consent .      Patient Consent for Virtual Visit        Tonya [name] has provided verbal consent on 07/23/2022 for a virtual visit (video or telephone).   CONSENT FOR VIRTUAL VISIT FOR:  Tonya [name]  By participating in this virtual visit I agree to the following:  I hereby voluntarily request, consent and authorize [place] and its employed or contracted physicians, physician assistants, nurse practitioners or other licensed health care professionals (the Practitioner), to provide me with telemedicine health care services (the "Services") as deemed necessary by the treating Practitioner. I acknowledge and consent to receive the Services by the Practitioner via telemedicine. I understand that the telemedicine visit will involve communicating with the Practitioner through live audiovisual communication technology and the disclosure of certain medical information by electronic transmission. I acknowledge that I have been given the opportunity to request an in-person assessment or other available alternative prior to the telemedicine visit and am voluntarily participating in the telemedicine visit.  I understand that I have the right to withhold or withdraw my consent to the use of telemedicine in the course of my care at any time, without affecting my right to future care or treatment, and that the Practitioner or I may terminate the telemedicine visit at any time. I understand that I have the right to inspect all information obtained and/or recorded in the course of the telemedicine visit and may receive copies of available information for a reasonable fee.  I understand that some of  the potential risks of receiving the Services via telemedicine include:  Delay or interruption in medical evaluation due to technological equipment failure or disruption; Information transmitted may not be sufficient (e.g. poor resolution of images) to allow for appropriate medical decision making by the Practitioner; and/or  In rare instances, security protocols could fail, causing a breach of personal health information.  Furthermore, I acknowledge that it is my responsibility to provide information about my medical history, conditions and care that is complete and accurate to the best of my ability. I acknowledge that Practitioner's advice, recommendations, and/or decision may be based on factors not within their control, such as incomplete or inaccurate data provided by me or distortions of diagnostic images or specimens that may result from electronic transmissions. I understand that the practice of medicine is not an exact science and that Practitioner makes no warranties or guarantees regarding treatment outcomes. I acknowledge that a copy of this consent can be made available to me via my patient portal ([place]), or I can request a printed copy by calling the office of [facility].    I understand that my insurance will be billed for this visit.   I have read or had this consent read to me. I understand the contents of this consent, which adequately explains the benefits and risks of the Services being provided via telemedicine.  I have been provided ample opportunity to ask questions regarding this consent and the Services and have had my questions answered to my satisfaction. I give my informed consent for the services to be provided through the  use of telemedicine in my medical care

## 2022-07-23 NOTE — Telephone Encounter (Signed)
Patient with diagnosis of afib on Eliquis for anticoagulation.    Procedure:  ORIF LEFT ELBOW Date of procedure: 07/28/22  CHA2DS2-VASc Score = 6   This indicates a 9.7% annual risk of stroke. The patient's score is based upon: CHF History: 0 HTN History: 1 Diabetes History: 1 Stroke History: 0 Vascular Disease History: 1 Age Score: 2 Gender Score: 1      CrCl 30.1 ml/min (adjusted), 27.29 (ideal) Platelet count 129  Per office protocol, patient can hold Eliquis for 3 days prior to procedure.    **This guidance is not considered finalized until pre-operative APP has relayed final recommendations.**

## 2022-07-23 NOTE — Telephone Encounter (Signed)
I s/w the pt's daughter Tonya [name] [facility]) in regard to the need for a tele pre op appt for the pt. We have scheduled a tele pre op appt 07/24/22 @ 3 pm. Tonya [name] states either she or her husband will be with the pt for the tele appt. Tonya [name] states the pt has dementia. Tonya [name] has confirmed medications and given consent .

## 2022-07-23 NOTE — Progress Notes (Addendum)
For Short Stay: [place] appointment date: N/A Date of COVID positive in last 28 days:N/A  Bowel Prep reminder: N/A   For Anesthesia: PCP - [name], MD last offive visit note 07/21/22 in epic Cardiologist - Minus Breeding, MD last office visit appt note 11/27/21 in epic, cardiac clearance telephone appt. 10/27 at 3 pm Nephrologist- Murlean Iba, MD [place], [place]   Chest x-ray - 07/17/22 in epic EKG - 12/25/21 in epic Stress Test - 07/04/21 in epic ECHO - 05/28/22 in epic  Cardiac Cath - greater than 2 years Pacemaker/ICD device last checked: N/A Pacemaker orders received: N/A Device Rep notified: N/A  Spinal Cord Stimulator: N/A  Sleep Study - N/A CPAP - N/A  Fasting Blood Sugar - N/A Checks Blood Sugar __N/A___ times a day Date and result of last Hgb A1c-N/A  Blood Thinner Instructions: Eliqius has an appt with cardiologist this afternoon at 3 pm will discuss at that time Aspirin Instructions: N/A Last Dose: N/A  Activity level:  activities of daily living without stopping and without chest pain and/or shortness of breath     Anesthesia review: Atiral Fib, CAD, 1.2 cm saccular aneurysm of the proximal cavernous right ICA, CKD III,   Patient denies shortness of breath, fever, cough and chest pain at PAT appointment   Patient verbalized understanding of instructions that were given to them at the PAT appointment. Patient was also instructed that they will need to review over the PAT instructions again at home before surgery.

## 2022-07-23 NOTE — Chronic Care Management (AMB) (Addendum)
Chronic Care Management Pharmacy Assistant   Name: Tonya [name]  MRN: [id number] DOB: November 12, 1940  Reason for Encounter: Hospital Follow Up    Medications: Outpatient Encounter Medications as of 07/23/2022  Medication Sig   atorvastatin (LIPITOR) 20 MG tablet TAKE 1 TABLET BY MOUTH EVERY DAY   Blood Glucose Monitoring Suppl (ACCU-CHEK AVIVA PLUS) w/Device KIT Use to check blood once sugar daily.   Calcium Carbonate-Vit D-Min (CALCIUM 1200 PO) Take by mouth. 1 daily   carvedilol (COREG) 12.5 MG tablet TAKE 1 TABLET (12.$RemoveBefo'5MG'PrXvfUJlzKc$  TOTAL) BY MOUTH TWICE A DAY WITH MEALS   cetirizine (ZYRTEC) 10 MG tablet Take 10 mg by mouth at bedtime.   cholestyramine light (PREVALITE) 4 g packet USE 1 PACKET TWICE DAILY WITH A MEAL, AT LEAST 2 HOURS AWAY FROM OTHER MEDS PATIENT NEEDS OFFICE VISIT   citalopram (CELEXA) 40 MG tablet TAKE 1 TABLET BY MOUTH EVERY DAY   diltiazem (CARDIZEM CD) 180 MG 24 hr capsule TAKE 1 CAPSULE BY MOUTH EVERY DAY (Patient taking differently: Take 180 mg by mouth daily.)   ELIQUIS 2.5 MG TABS tablet TAKE 1 TABLET BY MOUTH TWICE A DAY (Patient taking differently: Take 2.5 mg by mouth 2 (two) times daily.)   ferrous sulfate 325 (65 FE) MG EC tablet Take 325 mg by mouth 2 (two) times daily.   glucose blood (ACCU-CHEK AVIVA PLUS) test strip Use to check blood sugar daily.   isosorbide mononitrate (IMDUR) 30 MG 24 hr tablet TAKE 1 TABLET BY MOUTH EVERY DAY (Patient taking differently: Take 30 mg by mouth daily.)   losartan (COZAAR) 100 MG tablet TAKE 1 TABLET BY MOUTH EVERY DAY   Magnesium Gluconate (MAGNESIUM 27 PO) Take 2 each by mouth daily.   methocarbamol (ROBAXIN) 500 MG tablet Take 1 tablet (500 mg total) by mouth 2 (two) times daily.   omeprazole (PRILOSEC) 20 MG capsule TAKE 1 CAPSULE BY MOUTH EVERY DAY (Patient taking differently: Take 20 mg by mouth daily.)   ondansetron (ZOFRAN-ODT) 4 MG disintegrating tablet Take 1 tablet (4 mg total) by mouth every 8 (eight) hours as  needed.   Vitamin D, Ergocalciferol, (DRISDOL) 1.25 MG (50000 UNIT) CAPS capsule TAKE 1 CAPSULE (50,000 UNITS TOTAL) BY MOUTH EVERY 7 (SEVEN) DAYS   No facility-administered encounter medications on file as of 07/23/2022.    Reviewed hospital notes for details of recent visit. Has patient been contacted by Transitions of Care team? No Has patient seen PCP/specialist for hospital follow up (summarize OV if yes): Yes Now that she needs 24 hour care and supervision.Marland Kitchen daughter is looking  into  memory care nursing home care. She has had an increasing number of accidental falls I will get you back in touch with [facility]  Admitted to the ED on 07/17/22. Discharge date was 07/18/22.  Discharged from [facility] ED  Discharge diagnosis (Principal Problem): Fall Patient was discharged to Home  Brief summary of hospital course: I have independently visualized and interpreted pt's images today. Head CT today is negative for any acute bleed, cervical spine without acute fracture, wrist, hand, knee imaging all negative for fracture.  Elbow imaging with wires in place.  Radiology reports no obvious fracture in the left hand, right wrist, left knee.  Left elbow shows no acute fracture or joint effusion.  She does have 2 K wires that traverse the olecranon and project 5 mm into the posterior soft tissue.  There is no definite fracture line seen.  CT of  the head and maxillofacial are negative for acute pathology the patient has a known saccular aneurysm that is unchanged.  Cervical spine shows significant arthritis without evidence of fractures or traumatic listhesis.  There is mass effect on the cord surface due to protruding disc at C2-3 through C4-5 due to posterior disc osteophytes however patient is having no difficulty raising her arms denies any numbness or tingling going down the arms and has had no new issues with balance or walking.  She has no weakness in her legs.  She complains of her neck  feeling little sore after the fall but is not displaying any other findings concerning for acute neurologic compromise.  Feel that this is most likely her baseline.  Did discuss this with the patient and her daughter.  Her daughter reports that she is uninterested in having invasive things done to her mother's neck especially given her age and all her recent surgeries.  Discussed with she and her daughter the findings of her other imaging and no obvious signs of fracture.  Patient is still having significant discomfort in the left elbow and will splint the arm in case there is an occult fracture that is unseen on plain film.  Encouraged him to follow-up with Dr. Percell Miller who did the surgery and have it rechecked.  Hoping it is just contusion.  Patient has not tolerated narcotics in the past but had done well with a muscle relaxer and Tylenol.  We will give her this to ensure that she can ambulate prior to discharge.  The hope is that she can be discharged with her daughter this evening.  Start Methocarbamol 500mg  twice daily   Medications that remain the same after Hospital Discharge:??  -All other medications will remain the same.    Next CCM appt: none  Other upcoming appts: PCP appointment on 08/18/22  telephone AWV, 09/18/22  [facility]  Charlene Brooke, PharmD notified and will determine if action is needed.    Avel Sensor, [place]  [phone]    Pharmacist addendum: PCP has advised patient reconnect with SW/Care guide - unclear if team has contacted patient yet. Forwarded message to [employer], Care Guide to help facilitate follow up.  Charlene Brooke, PharmD, BCACP 07/28/22 10:04 AM

## 2022-07-23 NOTE — Telephone Encounter (Signed)
Virtual visit for clearance scheduled for 07/24/22. Pharmacy recommendations will be reviewed at that time. Will remove from preop pool.  Loel Dubonnet, NP

## 2022-07-23 NOTE — Telephone Encounter (Signed)
   Name: Tonya [name]  DOB: 01-Jun-1941  MRN: [id number]  Primary Cardiologist: Minus Breeding, MD  Chart reviewed as part of pre-operative protocol coverage. Because of [name]'s past medical history and time since last visit, she will require a follow-up phone visit in order to better assess preoperative cardiovascular risk.  Will route to preop callback staff for assitance in scheduling.   This message will also be routed to pharmacy pool for input on holding Eliquis as requested below so that this information is available to the clearing provider at time of patient's appointment.   On Eliquis due to atrial fibrillation.   CHA2DS2-VASc Score = 6   This indicates a 9.7% annual risk of stroke. The patient's score is based upon: CHF History: 0 HTN History: 1 Diabetes History: 1 Stroke History: 0 Vascular Disease History: 1 Age Score: 2 Gender Score: 1   Platelet count: 192 (07/17/22) Creatinine clearance: 34 mL/min (original ) or 30 mL/min (adjusted by weight) (06/17/22)   Loel Dubonnet, NP  07/23/2022, 8:15 AM

## 2022-07-24 ENCOUNTER — Encounter (HOSPITAL_BASED_OUTPATIENT_CLINIC_OR_DEPARTMENT_OTHER): Payer: Self-pay | Admitting: Family

## 2022-07-24 ENCOUNTER — Ambulatory Visit (INDEPENDENT_AMBULATORY_CARE_PROVIDER_SITE_OTHER): Payer: Medicare Other | Admitting: Family

## 2022-07-24 ENCOUNTER — Encounter (HOSPITAL_COMMUNITY): Payer: Self-pay | Admitting: Orthopedic Surgery

## 2022-07-24 DIAGNOSIS — Z01818 Encounter for other preprocedural examination: Secondary | ICD-10-CM

## 2022-07-24 NOTE — Progress Notes (Signed)
Virtual Visit via Telephone Note   Because of Tonya [name]'s co-morbid illnesses, she is at least at moderate risk for complications without adequate follow up.  This format is felt to be most appropriate for this patient at this time.  The patient did not have access to video technology/had technical difficulties with video requiring transitioning to audio format only (telephone).  All issues noted in this document were discussed and addressed.  No physical exam could be performed with this format.  Please refer to the patient's chart for her consent to telehealth for [facility].    Date:  07/24/2022   ID:  Tonya [name], DOB Aug 30, 1941, MRN [id number] The patient was identified using 2 identifiers.  Patient Location: Home Provider Location: Home Office   PCP:  [name], MD   [place] [facility] Cardiologist:  Minus Breeding, MD     Evaluation Performed:  Follow-Up Visit  Chief Complaint:  Preop clearance  History of Present Illness:    Tonya [name] is a 81 y.o. female with atrial fibrillation, 2.3 cm saccular aneurysm of proximal cavernous right ICA, CAD with LAD stent, CKD, HTN.   Prior myoview 07/04/21 low risk study with no evidence of ischemia.  Last seen 11/27/21 by Caron Presume, PA. She was feeling well at that time with stable moderate exertional dyspnea which she attributed to deconditioning. Recommended to follow up in one year. Echo 12/26/21 normal LVEF 60-65%, no RWMA, mildly elevated PASP, bilateral atria moderately dilated, small pericardial effusion without tamponade, mild MR, RA pressure 58mHg.   Presents today for preoperative clearance for ORIF of left elbow after fall. Per pharmacy team may hold Eliquis 3 days prior to planned procedure. Visit assisted by family member due to dementia. Doing well since last seen. Reports no shortness of breath at rest and stable mild exertional dyspnea.. Reports no chest pain, pressure, or  tightness. No edema, orthopnea, PND. Reports no palpitations.   Reports fall was after missing a step and no lightheadedness, dizziness, near syncope, syncope.    Past Medical History:  Diagnosis Date   Adenocarcinoma, colon ([place]    Anemia    iron deficiency   Anxiety    Atrial fibrillation (HCC)    Carpal tunnel syndrome    Cataract    REMOVED BILATERAL   Chronic diarrhea    Colon polyps    Tubular adenoma   Coronary artery disease    Decreased vision    Degenerative disk disease    Dementia (HCC)    Moderate   Depression    Diabetes mellitus, type 2 (HCC)    diet controlled   Dysrhythmia    h/o AFIB   Frequent falls    GERD (gastroesophageal reflux disease)    History of blood transfusion    After surgeries   Hyperlipidemia    Hyperparathyroidism ([place]    Hypertension    IBS (irritable bowel syndrome)    Kidney disorder    Stage 3b chronic kidney disease   Osteoarthritis    Osteoporosis    Pneumonia    PONV (postoperative nausea and vomiting)    S/P coronary artery stent placement 2007   Past Surgical History:  Procedure Laterality Date   CARDIAC CATHETERIZATION  08/2009   LAD stent patent. Insignificant CAD, otherwise EF 60-65%   CARPAL TUNNEL RELEASE     CATARACT EXTRACTION Bilateral 03/2015   CHOLECYSTECTOMY     COLON SURGERY     COLONOSCOPY  DILATION AND CURETTAGE OF UTERUS     HEMATOMA EVACUATION     after knee replacement   KNEE ARTHROSCOPY     bilateral   LUMBAR LAMINECTOMY N/A 11/26/2015   Procedure: LUMBAR DECOMPRESSIVE LAMINECTOMY L3-4 AND L4-5, LEFT L2-3 HEMILAMINECTOMY;  Surgeon: Jessy Oto, MD;  Location: [place];  Service: Orthopedics;  Laterality: N/A;   ORIF ELBOW FRACTURE Left 03/18/2021   Procedure: OPEN REDUCTION INTERNAL FIXATION (ORIF) ELBOW/OLECRANON FRACTURE;  Surgeon: Renette Butters, MD;  [facility];  Service: Orthopedics;  Laterality: Left;   PARTIAL COLECTOMY  11/2008   right, for adenocarcinoma   REPLACEMENT  TOTAL KNEE  62/1308   right, complicated by hemarthrosis    REVERSE SHOULDER ARTHROPLASTY Right 05/28/2016   Procedure: REVERSE SHOULDER ARTHROPLASTY;  Surgeon: Meredith Pel, MD;  Location: [place];  Service: Orthopedics;  Laterality: Right;   TONSILLECTOMY     TOTAL KNEE ARTHROPLASTY Left 04/03/2014   Procedure: TOTAL KNEE ARTHROPLASTY;  Surgeon: Garald Balding, MD;  Location: [place];  Service: Orthopedics;  Laterality: Left;   TOTAL KNEE REVISION  03/08/2012   Procedure: TOTAL KNEE REVISION;  Surgeon: Garald Balding, MD;  Location: [place];  Service: Orthopedics;  Laterality: Right;  removal total knee hardware and placement of antibiotic cement spacer and antibiotic beads   TOTAL KNEE REVISION  05/17/2012   Procedure: TOTAL KNEE REVISION;  Surgeon: Garald Balding, MD;  Location: [place];  Service: Orthopedics;  Laterality: Right;  right total knee revision, removal of antibiotic spacer   UPPER GI ENDOSCOPY       Current Meds  Medication Sig   atorvastatin (LIPITOR) 20 MG tablet TAKE 1 TABLET BY MOUTH EVERY DAY   Blood Glucose Monitoring Suppl (ACCU-CHEK AVIVA PLUS) w/Device KIT Use to check blood once sugar daily.   Calcium Carb-Cholecalciferol (CALCIUM 600 + D PO) Take 1 tablet by mouth daily.   carvedilol (COREG) 12.5 MG tablet TAKE 1 TABLET (12.5MG TOTAL) BY MOUTH TWICE A DAY WITH MEALS   cetirizine (ZYRTEC) 10 MG tablet Take 10 mg by mouth at bedtime.   cholestyramine light (PREVALITE) 4 g packet USE 1 PACKET TWICE DAILY WITH A MEAL, AT LEAST 2 HOURS AWAY FROM OTHER MEDS PATIENT NEEDS OFFICE VISIT (Patient taking differently: Take 4 g by mouth daily.)   citalopram (CELEXA) 40 MG tablet TAKE 1 TABLET BY MOUTH EVERY DAY   diltiazem (CARDIZEM CD) 180 MG 24 hr capsule TAKE 1 CAPSULE BY MOUTH EVERY DAY (Patient taking differently: Take 180 mg by mouth daily.)   ELIQUIS 2.5 MG TABS tablet TAKE 1 TABLET BY MOUTH TWICE A DAY (Patient taking differently: Take 2.5 mg by mouth 2 (two)  times daily.)   ferrous sulfate 325 (65 FE) MG EC tablet Take 325 mg by mouth 2 (two) times daily.   glucose blood (ACCU-CHEK AVIVA PLUS) test strip Use to check blood sugar daily.   isosorbide mononitrate (IMDUR) 30 MG 24 hr tablet TAKE 1 TABLET BY MOUTH EVERY DAY (Patient taking differently: Take 30 mg by mouth daily.)   losartan (COZAAR) 100 MG tablet TAKE 1 TABLET BY MOUTH EVERY DAY   Magnesium Gluconate (MAGNESIUM 27 PO) Take 500 mg by mouth daily.   omeprazole (PRILOSEC) 20 MG capsule TAKE 1 CAPSULE BY MOUTH EVERY DAY (Patient taking differently: Take 20 mg by mouth daily.)   ondansetron (ZOFRAN-ODT) 4 MG disintegrating tablet Take 1 tablet (4 mg total) by mouth every 8 (eight) hours as needed.   Vitamin  D, Ergocalciferol, (DRISDOL) 1.25 MG (50000 UNIT) CAPS capsule TAKE 1 CAPSULE (50,000 UNITS TOTAL) BY MOUTH EVERY 7 (SEVEN) DAYS     Allergies:   Amoxicillin-pot clavulanate and Nitrofurantoin   Social History   Tobacco Use   Smoking status: Never   Smokeless tobacco: Never  Vaping Use   Vaping Use: Never used  Substance Use Topics   Alcohol use: No    Alcohol/week: 0.0 standard drinks of alcohol   Drug use: No     Family Hx: The patient's family history includes Colon polyps in her sister; Diabetes in her paternal grandmother; Heart attack in her mother; Heart disease in her father and mother; Prostate cancer in her father. There is no history of Colon cancer, Esophageal cancer, Pancreatic cancer, Stomach cancer, Liver disease, or Breast cancer.  ROS:   Please see the history of present illness.     All other systems reviewed and are negative.   Prior CV studies:   The following studies were reviewed today:  None  Labs/Other Tests and Data Reviewed:    EKG:  No ECG reviewed.  Recent Labs: 12/25/2021: B Natriuretic Peptide 259.0; TSH 4.414 12/27/2021: Magnesium 2.0 03/17/2022: ALT 30; BUN 21; Creatinine, Ser 1.38; Potassium 4.0; Sodium 142 07/17/2022: Hemoglobin  14.3; Platelets 192   Recent Lipid Panel Lab Results  Component Value Date/Time   CHOL 101 03/17/2022 08:06 AM   TRIG 136.0 03/17/2022 08:06 AM   TRIG 240 (HH) 09/07/2006 08:49 AM   HDL 41.90 03/17/2022 08:06 AM   CHOLHDL 2 03/17/2022 08:06 AM   LDLCALC 32 03/17/2022 08:06 AM   LDLDIRECT 50.0 10/22/2014 08:41 AM    Wt Readings from Last 3 Encounters:  07/24/22 160 lb (72.6 kg)  07/21/22 158 lb 2 oz (71.7 kg)  07/17/22 176 lb 5.9 oz (80 kg)     Risk Assessment/Calculations:    CHA2DS2-VASc Score = 6   This indicates a 9.7% annual risk of stroke. The patient's score is based upon: CHF History: 0 HTN History: 1 Diabetes History: 1 Stroke History: 0 Vascular Disease History: 1 Age Score: 2 Gender Score: 1         Objective:    Vital Signs:  No vital signs available for review  ASSESSMENT & PLAN:    Preoperative clearance - Able to achieve >4 METS. Per AHA/ACC guidelines, she is deemed acceptable risk for the planned procedure without additional cardiovascular testing. Will route to surgical team so they are aware.    Anticoagulant: Per pharmacy team and office protocols may hold Eliquis 3 days prior to planned procedure.   Time:   Today, I have spent 5 minutes with the patient with telehealth technology discussing the above problems.     Medication Adjustments/Labs and Tests Ordered: Current medicines are reviewed at length with the patient today.  Concerns regarding medicines are outlined above.   Tests Ordered: No orders of the defined types were placed in this encounter.   Medication Changes: No orders of the defined types were placed in this encounter.   Follow Up:  In Person  11/2022 as scheduled with Dr. Percival Spanish  Signed, Loel Dubonnet, NP  07/24/2022 3:08 PM    [place]

## 2022-07-25 ENCOUNTER — Encounter: Payer: Self-pay | Admitting: Cardiology

## 2022-07-27 ENCOUNTER — Telehealth: Payer: Self-pay | Admitting: Family Medicine

## 2022-07-27 ENCOUNTER — Telehealth: Payer: Self-pay | Admitting: *Deleted

## 2022-07-27 MED ORDER — ATORVASTATIN CALCIUM 20 MG PO TABS: 20.0000 mg | ORAL_TABLET | Freq: Every day | ORAL | 3 refills | Status: DC

## 2022-07-27 NOTE — Anesthesia Preprocedure Evaluation (Signed)
Anesthesia Evaluation  Patient identified by MRN, date of birth, ID band Patient awake    Reviewed: Allergy & Precautions, NPO status , Patient's Chart, lab work & pertinent test results, reviewed documented beta blocker date and time   History of Anesthesia Complications (+) PONV and history of anesthetic complications  Airway Mallampati: II  TM Distance: >3 FB Neck ROM: Full    Dental  (+) Dental Advisory Given, Upper Dentures, Poor Dentition, Missing   Pulmonary    Pulmonary exam normal breath sounds clear to auscultation       Cardiovascular hypertension, Pt. on home beta blockers and Pt. on medications + CAD, + Cardiac Stents and + Peripheral Vascular Disease  + dysrhythmias Atrial Fibrillation  Rhythm:Irregular Rate:Abnormal     Neuro/Psych PSYCHIATRIC DISORDERS Anxiety Depression Dementia negative neurological ROS     GI/Hepatic GERD  Medicated,  Endo/Other  diabetes, Well Controlled, Type 2  Renal/GU Renal InsufficiencyRenal disease     Musculoskeletal  (+) Arthritis , LEFT ELBOW FRACTURE WITH LIGAMENT TEAR   Abdominal   Peds  Hematology   Anesthesia Other Findings   Reproductive/Obstetrics                            Anesthesia Physical Anesthesia Plan  ASA: 3  Anesthesia Plan: General   Post-op Pain Management: Regional block* and Tylenol PO (pre-op)*   Induction:   PONV Risk Score and Plan: 4 or greater and Propofol infusion, Dexamethasone and Ondansetron  Airway Management Planned: Oral ETT  Additional Equipment:   Intra-op Plan:   Post-operative Plan: Extubation in OR  Informed Consent: I have reviewed the patients History and Physical, chart, labs and discussed the procedure including the risks, benefits and alternatives for the proposed anesthesia with the patient or authorized representative who has indicated his/her understanding and acceptance.     Dental  advisory given  Plan Discussed with: CRNA  Anesthesia Plan Comments: (See APP note by Joslyn Hy, FNP )       Anesthesia Quick Evaluation

## 2022-07-27 NOTE — Progress Notes (Signed)
Anesthesia Chart Review:   Case: [id number] Date/Time: 07/28/22 1700   Procedure: OPEN REDUCTION INTERNAL FIXATION (ORIF) ELBOW/OLECRANON FRACTURE WITH LATERAL COLLATERAL LIGAMENT REPAIR AND PIN REMOVAL (Left: Elbow)   Anesthesia type: Choice   Pre-op diagnosis: LEFT ELBOW FRACTURE WITH LIGAMENT TEAR   [facility]   Surgeons: Renette Butters, MD       DISCUSSION: Pt is 81 years old with hx atrial fibrillation, 1 cm saccular aneurysm of cavernous right ICA (unchanged by 07/17/22 CT), CAD (LAD stent 2007), CKD, HTN, dementia (per pcp notes, needs 24 hour care/supervision and daughter is looking into memory care nursing homes).   Pt to hold eliquis 3 days before surgery   VS: Ht _0  (1.651 m)   Wt 72.6 kg   BMI 26.63 kg/m   PROVIDERS: - PCP is [name], MD - Cardiologist is Minus Breeding, MD. Last office visit 11/27/21 with Caron Presume, PA. Cleared for surgery at acceptable risk at telephone appointment 07/24/22 with Laurann Montana, NP.  - Nephrologist is Murlean Iba, MD.   LABS:  CBC 07/17/22. WBC 12.9, otherwise normal BMP and A1c:  Will be obtained day of surgery   IMAGES: CT head, maxillofacial, cervical spine 07/17/22:  1. No acute intracranial CT findings or depressed skull fractures. 2. 1 cm saccular aneurysm of the medial aspect of the cavernous right ICA protruding into the right side of the sella, by CT appears unchanged. 3. Osteopenia and degenerative change of the cervical spine without evidence of fractures or traumatic listhesis. 4. Mass effect on the cord surface due to protruding discs C2-3 through C4-5, due to posterior disc osteophyte complex at C6-7. 5. Multilevel degenerative foraminal stenosis, and at C1-2, asymmetric bulky osteophytes of the left C1-2 lateral mass articulations encroaching on the left dorsolateral thecal sac.  1 view CXR 07/17/22:  - No active disease.  Mild cardiomegaly  EKG 12/25/21: Atrial  fibrillation. Anteroseptal infarct, age indeterminate   CV: Echo 12/26/21:  1. Left ventricular ejection fraction, by estimation, is 55 to 60%. The left ventricle has normal function. The left ventricle has no regional wall motion abnormalities. Left ventricular diastolic function could not be evaluated.  2. Right ventricular systolic function is normal. The right ventricular size is normal. There is mildly elevated pulmonary artery systolic pressure.  3. Left atrial size was moderately dilated.  4. Right atrial size was moderately dilated.  5. A small pericardial effusion is present. There is no evidence of cardiac tamponade.   6. The mitral valve is normal in structure. Mild mitral valve regurgitation. No evidence of mitral stenosis.  7. The aortic valve is tricuspid. Aortic valve regurgitation is not visualized. No aortic stenosis is present.  8. The inferior vena cava is dilated in size with <50% respiratory variability, suggesting right atrial pressure of 15 mmHg.   Nuclear stress test 07/04/21:    The study is normal. The study is low risk.   This was a non-gated study.   No ST deviation was noted.   Prior study available for comparison from 02/09/2012.   Holter monitor 09/22/18:  - Atrial fib - Rare PVCs - Well controlled ventricular rate for the most part except for mildly increased HR in the early morning hours.     Past Medical History:  Diagnosis Date   Adenocarcinoma, colon ([place])    Anemia    iron deficiency   Anxiety    Atrial fibrillation (HCC)    Carpal  tunnel syndrome    Cataract    REMOVED BILATERAL   Chronic diarrhea    Colon polyps    Tubular adenoma   Coronary artery disease    Decreased vision    Degenerative disk disease    Dementia (HCC)    Moderate   Depression    Diabetes mellitus, type 2 (HCC)    diet controlled   Dysrhythmia    h/o AFIB   Frequent falls    GERD (gastroesophageal reflux disease)    History of blood transfusion    After  surgeries   Hyperlipidemia    Hyperparathyroidism ([place])    Hypertension    IBS (irritable bowel syndrome)    Kidney disorder    Stage 3b chronic kidney disease   Osteoarthritis    Osteoporosis    Pneumonia    PONV (postoperative nausea and vomiting)    S/P coronary artery stent placement 2007    Past Surgical History:  Procedure Laterality Date   CARDIAC CATHETERIZATION  08/2009   LAD stent patent. Insignificant CAD, otherwise EF 60-65%   CARPAL TUNNEL RELEASE     CATARACT EXTRACTION Bilateral 03/2015   CHOLECYSTECTOMY     COLON SURGERY     COLONOSCOPY     DILATION AND CURETTAGE OF UTERUS     HEMATOMA EVACUATION     after knee replacement   KNEE ARTHROSCOPY     bilateral   LUMBAR LAMINECTOMY N/A 11/26/2015   Procedure: LUMBAR DECOMPRESSIVE LAMINECTOMY L3-4 AND L4-5, LEFT L2-3 HEMILAMINECTOMY;  Surgeon: Jessy Oto, MD;  Location: [place];  Service: Orthopedics;  Laterality: N/A;   ORIF ELBOW FRACTURE Left 03/18/2021   Procedure: OPEN REDUCTION INTERNAL FIXATION (ORIF) ELBOW/OLECRANON FRACTURE;  Surgeon: Renette Butters, MD;  [facility];  Service: Orthopedics;  Laterality: Left;   PARTIAL COLECTOMY  11/2008   right, for adenocarcinoma   REPLACEMENT TOTAL KNEE  51/7001   right, complicated by hemarthrosis    REVERSE SHOULDER ARTHROPLASTY Right 05/28/2016   Procedure: REVERSE SHOULDER ARTHROPLASTY;  Surgeon: Meredith Pel, MD;  Location: [place];  Service: Orthopedics;  Laterality: Right;   TONSILLECTOMY     TOTAL KNEE ARTHROPLASTY Left 04/03/2014   Procedure: TOTAL KNEE ARTHROPLASTY;  Surgeon: Garald Balding, MD;  Location: [place];  Service: Orthopedics;  Laterality: Left;   TOTAL KNEE REVISION  03/08/2012   Procedure: TOTAL KNEE REVISION;  Surgeon: Garald Balding, MD;  Location: [place];  Service: Orthopedics;  Laterality: Right;  removal total knee hardware and placement of antibiotic cement spacer and antibiotic beads   TOTAL KNEE REVISION  05/17/2012    Procedure: TOTAL KNEE REVISION;  Surgeon: Garald Balding, MD;  Location: [place];  Service: Orthopedics;  Laterality: Right;  right total knee revision, removal of antibiotic spacer   UPPER GI ENDOSCOPY      MEDICATIONS: No current facility-administered medications for this encounter.    atorvastatin (LIPITOR) 20 MG tablet   Calcium Carb-Cholecalciferol (CALCIUM 600 + D PO)   carvedilol (COREG) 12.5 MG tablet   cetirizine (ZYRTEC) 10 MG tablet   cholestyramine light (PREVALITE) 4 g packet   citalopram (CELEXA) 40 MG tablet   diltiazem (CARDIZEM CD) 180 MG 24 hr capsule   ELIQUIS 2.5 MG TABS tablet   ferrous sulfate 325 (65 FE) MG EC tablet   isosorbide mononitrate (IMDUR) 30 MG 24 hr tablet   losartan (COZAAR) 100 MG tablet   Magnesium Gluconate (MAGNESIUM 27 PO)   omeprazole (PRILOSEC) 20 MG  capsule   ondansetron (ZOFRAN-ODT) 4 MG disintegrating tablet   Vitamin D, Ergocalciferol, (DRISDOL) 1.25 MG (50000 UNIT) CAPS capsule   Blood Glucose Monitoring Suppl (ACCU-CHEK AVIVA PLUS) w/Device KIT   glucose blood (ACCU-CHEK AVIVA PLUS) test strip    If labs acceptable day of surgery, I anticipate pt can proceed with surgery as scheduled.   Willeen Cass, PhD, FNP-BC [facility]/Anesthesiology Phone: [phone] 07/27/2022 9:52 AM

## 2022-07-27 NOTE — Telephone Encounter (Signed)
Refill for Lipitor complete.

## 2022-07-27 NOTE — Telephone Encounter (Signed)
Patient daughter Almyra Free called in stating that she is having surgery tomorrow. She was wanting her to be discharged from the hospital to a nursing facility. She was wanting a call to discuss this transfer. She can be reached at [phone]. Thank you!

## 2022-07-28 ENCOUNTER — Ambulatory Visit (HOSPITAL_COMMUNITY): Payer: Medicare Other | Admitting: Physician Assistant

## 2022-07-28 ENCOUNTER — Inpatient Hospital Stay (HOSPITAL_COMMUNITY)
Admission: AD | Admit: 2022-07-28 | Discharge: 2022-07-30 | DRG: 493 | Disposition: A | Discharge status: Skilled Nursing Facility | Payer: Medicare Other | Attending: Orthopedic Surgery | Admitting: Orthopedic Surgery

## 2022-07-28 ENCOUNTER — Ambulatory Visit (HOSPITAL_BASED_OUTPATIENT_CLINIC_OR_DEPARTMENT_OTHER): Payer: Medicare Other | Admitting: Physician Assistant

## 2022-07-28 ENCOUNTER — Other Ambulatory Visit: Payer: Self-pay | Admitting: Family Medicine

## 2022-07-28 ENCOUNTER — Encounter (HOSPITAL_COMMUNITY)
Admission: AD | Disposition: A | Discharge status: Skilled Nursing Facility | Payer: Self-pay | Source: Home / Self Care | Attending: Orthopedic Surgery

## 2022-07-28 ENCOUNTER — Other Ambulatory Visit: Payer: Self-pay

## 2022-07-28 ENCOUNTER — Encounter (HOSPITAL_COMMUNITY): Payer: Self-pay | Admitting: Orthopedic Surgery

## 2022-07-28 DIAGNOSIS — Z96653 Presence of artificial knee joint, bilateral: Secondary | ICD-10-CM | POA: Diagnosis not present

## 2022-07-28 DIAGNOSIS — Z833 Family history of diabetes mellitus: Secondary | ICD-10-CM | POA: Diagnosis not present

## 2022-07-28 DIAGNOSIS — T8484XA Pain due to internal orthopedic prosthetic devices, implants and grafts, initial encounter: Secondary | ICD-10-CM | POA: Diagnosis not present

## 2022-07-28 DIAGNOSIS — Z7901 Long term (current) use of anticoagulants: Secondary | ICD-10-CM | POA: Diagnosis not present

## 2022-07-28 DIAGNOSIS — F418 Other specified anxiety disorders: Secondary | ICD-10-CM

## 2022-07-28 DIAGNOSIS — K219 Gastro-esophageal reflux disease without esophagitis: Secondary | ICD-10-CM | POA: Diagnosis present

## 2022-07-28 DIAGNOSIS — I10 Essential (primary) hypertension: Secondary | ICD-10-CM | POA: Diagnosis not present

## 2022-07-28 DIAGNOSIS — S42452A Displaced fracture of lateral condyle of left humerus, initial encounter for closed fracture: Secondary | ICD-10-CM | POA: Diagnosis not present

## 2022-07-28 DIAGNOSIS — Z8042 Family history of malignant neoplasm of prostate: Secondary | ICD-10-CM | POA: Diagnosis not present

## 2022-07-28 DIAGNOSIS — I152 Hypertension secondary to endocrine disorders: Secondary | ICD-10-CM

## 2022-07-28 DIAGNOSIS — Z83719 Family history of colon polyps, unspecified: Secondary | ICD-10-CM

## 2022-07-28 DIAGNOSIS — F03911 Unspecified dementia, unspecified severity, with agitation: Secondary | ICD-10-CM | POA: Diagnosis not present

## 2022-07-28 DIAGNOSIS — Z9049 Acquired absence of other specified parts of digestive tract: Secondary | ICD-10-CM

## 2022-07-28 DIAGNOSIS — S53402A Unspecified sprain of left elbow, initial encounter: Secondary | ICD-10-CM

## 2022-07-28 DIAGNOSIS — Z79899 Other long term (current) drug therapy: Secondary | ICD-10-CM | POA: Diagnosis not present

## 2022-07-28 DIAGNOSIS — Z743 Need for continuous supervision: Secondary | ICD-10-CM | POA: Diagnosis not present

## 2022-07-28 DIAGNOSIS — I4891 Unspecified atrial fibrillation: Secondary | ICD-10-CM | POA: Diagnosis not present

## 2022-07-28 DIAGNOSIS — E119 Type 2 diabetes mellitus without complications: Secondary | ICD-10-CM | POA: Diagnosis not present

## 2022-07-28 DIAGNOSIS — E1151 Type 2 diabetes mellitus with diabetic peripheral angiopathy without gangrene: Secondary | ICD-10-CM | POA: Diagnosis not present

## 2022-07-28 DIAGNOSIS — W19XXXA Unspecified fall, initial encounter: Secondary | ICD-10-CM | POA: Diagnosis present

## 2022-07-28 DIAGNOSIS — Z96611 Presence of right artificial shoulder joint: Secondary | ICD-10-CM | POA: Diagnosis present

## 2022-07-28 DIAGNOSIS — S5322XA Traumatic rupture of left radial collateral ligament, initial encounter: Secondary | ICD-10-CM | POA: Diagnosis not present

## 2022-07-28 DIAGNOSIS — Z955 Presence of coronary angioplasty implant and graft: Secondary | ICD-10-CM

## 2022-07-28 DIAGNOSIS — Z85038 Personal history of other malignant neoplasm of large intestine: Secondary | ICD-10-CM | POA: Diagnosis not present

## 2022-07-28 DIAGNOSIS — I2510 Atherosclerotic heart disease of native coronary artery without angina pectoris: Secondary | ICD-10-CM | POA: Diagnosis not present

## 2022-07-28 DIAGNOSIS — Z8249 Family history of ischemic heart disease and other diseases of the circulatory system: Secondary | ICD-10-CM | POA: Diagnosis not present

## 2022-07-28 DIAGNOSIS — G8918 Other acute postprocedural pain: Secondary | ICD-10-CM | POA: Diagnosis not present

## 2022-07-28 DIAGNOSIS — M6281 Muscle weakness (generalized): Secondary | ICD-10-CM | POA: Diagnosis not present

## 2022-07-28 DIAGNOSIS — F03B18 Unspecified dementia, moderate, with other behavioral disturbance: Secondary | ICD-10-CM

## 2022-07-28 DIAGNOSIS — S42402A Unspecified fracture of lower end of left humerus, initial encounter for closed fracture: Secondary | ICD-10-CM | POA: Diagnosis not present

## 2022-07-28 DIAGNOSIS — E1121 Type 2 diabetes mellitus with diabetic nephropathy: Principal | ICD-10-CM

## 2022-07-28 DIAGNOSIS — R2689 Other abnormalities of gait and mobility: Secondary | ICD-10-CM | POA: Diagnosis not present

## 2022-07-28 DIAGNOSIS — I482 Chronic atrial fibrillation, unspecified: Secondary | ICD-10-CM

## 2022-07-28 DIAGNOSIS — S52022D Displaced fracture of olecranon process without intraarticular extension of left ulna, subsequent encounter for closed fracture with routine healing: Secondary | ICD-10-CM | POA: Diagnosis not present

## 2022-07-28 DIAGNOSIS — R404 Transient alteration of awareness: Secondary | ICD-10-CM | POA: Diagnosis not present

## 2022-07-28 DIAGNOSIS — R278 Other lack of coordination: Secondary | ICD-10-CM | POA: Diagnosis not present

## 2022-07-28 HISTORY — DX: Repeated falls: R29.6

## 2022-07-28 HISTORY — DX: Hyperparathyroidism, unspecified: E21.3

## 2022-07-28 HISTORY — DX: Unspecified dementia, unspecified severity, without behavioral disturbance, psychotic disturbance, mood disturbance, and anxiety: F03.90

## 2022-07-28 HISTORY — DX: Atherosclerotic heart disease of native coronary artery without angina pectoris: I25.10

## 2022-07-28 HISTORY — PX: ORIF ELBOW FRACTURE: SHX5031

## 2022-07-28 HISTORY — DX: Unspecified visual loss: H54.7

## 2022-07-28 HISTORY — DX: Unspecified atrial fibrillation: I48.91

## 2022-07-28 HISTORY — DX: Pneumonia, unspecified organism: J18.9

## 2022-07-28 LAB — BASIC METABOLIC PANEL
Anion gap: 8 (ref 5–15)
BUN: 27 mg/dL — ABNORMAL HIGH (ref 8–23)
CO2: 20 mmol/L — ABNORMAL LOW (ref 22–32)
Calcium: 8.5 mg/dL — ABNORMAL LOW (ref 8.9–10.3)
Chloride: 111 mmol/L (ref 98–111)
Creatinine, Ser: 1.4 mg/dL — ABNORMAL HIGH (ref 0.44–1.00)
GFR, Estimated: 38 mL/min — ABNORMAL LOW (ref >60)
Glucose, Bld: 168 mg/dL — ABNORMAL HIGH (ref 70–99)
Potassium: 4.2 mmol/L (ref 3.5–5.1)
Sodium: 139 mmol/L (ref 135–145)

## 2022-07-28 LAB — HEMOGLOBIN A1C
Hgb A1c MFr Bld: 6 % — ABNORMAL HIGH (ref 4.8–5.6)
Mean Plasma Glucose: 125.5 mg/dL

## 2022-07-28 LAB — GLUCOSE, CAPILLARY
Glucose-Capillary: 157 mg/dL — ABNORMAL HIGH (ref 70–99)
Glucose-Capillary: 171 mg/dL — ABNORMAL HIGH (ref 70–99)

## 2022-07-28 SURGERY — OPEN REDUCTION INTERNAL FIXATION (ORIF) ELBOW/OLECRANON FRACTURE
Anesthesia: General | Site: Elbow | Laterality: Left

## 2022-07-28 MED ORDER — CHLORHEXIDINE GLUCONATE 0.12 % MT SOLN
15.0000 mL | Freq: Once | OROMUCOSAL | Status: AC
  Administered 2022-07-28: 15 mL via OROMUCOSAL

## 2022-07-28 MED ORDER — ACETAMINOPHEN 500 MG PO TABS
1000.0000 mg | ORAL_TABLET | Freq: Once | ORAL | Status: AC
  Administered 2022-07-28: 1000 mg via ORAL
  Filled 2022-07-28: qty 2

## 2022-07-28 MED ORDER — DEXAMETHASONE SODIUM PHOSPHATE 4 MG/ML IJ SOLN
INTRAMUSCULAR | Status: DC | PRN
  Administered 2022-07-28: 5 mg via INTRAVENOUS

## 2022-07-28 MED ORDER — POVIDONE-IODINE 10 % EX SWAB
2.0000 | Freq: Once | CUTANEOUS | Status: AC
  Administered 2022-07-28: 2 via TOPICAL

## 2022-07-28 MED ORDER — ONDANSETRON HCL 4 MG PO TABS: 4.0000 mg | ORAL_TABLET | Freq: Four times a day (QID) | ORAL | Status: DC | PRN

## 2022-07-28 MED ORDER — ROCURONIUM BROMIDE 10 MG/ML (PF) SYRINGE
PREFILLED_SYRINGE | INTRAVENOUS | Status: DC | PRN
  Administered 2022-07-28: 60 mg via INTRAVENOUS

## 2022-07-28 MED ORDER — VANCOMYCIN HCL IN DEXTROSE 1-5 GM/200ML-% IV SOLN
1000.0000 mg | INTRAVENOUS | Status: AC
  Administered 2022-07-28: 1000 mg via INTRAVENOUS
  Filled 2022-07-28: qty 200

## 2022-07-28 MED ORDER — 0.9 % SODIUM CHLORIDE (POUR BTL) OPTIME
TOPICAL | Status: DC | PRN
  Administered 2022-07-28: 1000 mL

## 2022-07-28 MED ORDER — ACETAMINOPHEN 500 MG PO TABS
500.0000 mg | ORAL_TABLET | Freq: Four times a day (QID) | ORAL | Status: AC
  Administered 2022-07-28 – 2022-07-29 (×3): 500 mg via ORAL
  Filled 2022-07-28 (×3): qty 1

## 2022-07-28 MED ORDER — ISOSORBIDE MONONITRATE ER 30 MG PO TB24
30.0000 mg | ORAL_TABLET | Freq: Every day | ORAL | Status: DC
  Administered 2022-07-29 – 2022-07-30 (×2): 30 mg via ORAL
  Filled 2022-07-28 (×2): qty 1

## 2022-07-28 MED ORDER — SUGAMMADEX SODIUM 200 MG/2ML IV SOLN
INTRAVENOUS | Status: DC | PRN
  Administered 2022-07-28: 200 mg via INTRAVENOUS

## 2022-07-28 MED ORDER — LOSARTAN POTASSIUM 50 MG PO TABS
100.0000 mg | ORAL_TABLET | Freq: Every day | ORAL | Status: DC
  Administered 2022-07-29 – 2022-07-30 (×2): 100 mg via ORAL
  Filled 2022-07-28 (×2): qty 2

## 2022-07-28 MED ORDER — PROPOFOL 1000 MG/100ML IV EMUL
INTRAVENOUS | Status: AC
  Filled 2022-07-28: qty 100

## 2022-07-28 MED ORDER — POLYETHYLENE GLYCOL 3350 17 G PO PACK: 17.0000 g | PACK | Freq: Every day | ORAL | Status: DC | PRN

## 2022-07-28 MED ORDER — BISACODYL 10 MG RE SUPP: 10.0000 mg | Freq: Every day | RECTAL | Status: DC | PRN

## 2022-07-28 MED ORDER — ONDANSETRON HCL 4 MG/2ML IJ SOLN
INTRAMUSCULAR | Status: AC
  Filled 2022-07-28: qty 2

## 2022-07-28 MED ORDER — DOCUSATE SODIUM 100 MG PO CAPS
100.0000 mg | ORAL_CAPSULE | Freq: Two times a day (BID) | ORAL | Status: DC
  Administered 2022-07-28: 100 mg via ORAL
  Filled 2022-07-28 (×2): qty 1

## 2022-07-28 MED ORDER — LIDOCAINE 2% (20 MG/ML) 5 ML SYRINGE
INTRAMUSCULAR | Status: DC | PRN
  Administered 2022-07-28: 60 mg via INTRAVENOUS

## 2022-07-28 MED ORDER — PROPOFOL 10 MG/ML IV BOLUS
INTRAVENOUS | Status: AC
  Filled 2022-07-28: qty 20

## 2022-07-28 MED ORDER — FENTANYL CITRATE (PF) 100 MCG/2ML IJ SOLN
INTRAMUSCULAR | Status: DC | PRN
  Administered 2022-07-28 (×2): 50 ug via INTRAVENOUS

## 2022-07-28 MED ORDER — LORATADINE 10 MG PO TABS
10.0000 mg | ORAL_TABLET | Freq: Every day | ORAL | Status: DC
  Administered 2022-07-28 – 2022-07-29 (×2): 10 mg via ORAL
  Filled 2022-07-28 (×2): qty 1

## 2022-07-28 MED ORDER — FERROUS SULFATE 325 (65 FE) MG PO TABS
325.0000 mg | ORAL_TABLET | Freq: Two times a day (BID) | ORAL | Status: DC
  Administered 2022-07-28 – 2022-07-30 (×4): 325 mg via ORAL
  Filled 2022-07-28 (×4): qty 1

## 2022-07-28 MED ORDER — BUPIVACAINE HCL (PF) 0.5 % IJ SOLN
INTRAMUSCULAR | Status: DC | PRN
  Administered 2022-07-28: 10 mL via PERINEURAL

## 2022-07-28 MED ORDER — TRANEXAMIC ACID-NACL 1000-0.7 MG/100ML-% IV SOLN
1000.0000 mg | INTRAVENOUS | Status: AC
  Administered 2022-07-28: 1000 mg via INTRAVENOUS
  Filled 2022-07-28: qty 100

## 2022-07-28 MED ORDER — ACETAMINOPHEN 325 MG PO TABS: 325.0000 mg | ORAL_TABLET | Freq: Four times a day (QID) | ORAL | Status: DC | PRN

## 2022-07-28 MED ORDER — DEXAMETHASONE SODIUM PHOSPHATE 10 MG/ML IJ SOLN
INTRAMUSCULAR | Status: AC
  Filled 2022-07-28: qty 1

## 2022-07-28 MED ORDER — FENTANYL CITRATE (PF) 100 MCG/2ML IJ SOLN
INTRAMUSCULAR | Status: AC
  Filled 2022-07-28: qty 2

## 2022-07-28 MED ORDER — VANCOMYCIN HCL IN DEXTROSE 1-5 GM/200ML-% IV SOLN
1000.0000 mg | Freq: Two times a day (BID) | INTRAVENOUS | Status: AC
  Administered 2022-07-28: 1000 mg via INTRAVENOUS
  Filled 2022-07-28: qty 200

## 2022-07-28 MED ORDER — DEXAMETHASONE SODIUM PHOSPHATE 10 MG/ML IJ SOLN: 8.0000 mg | Freq: Once | INTRAMUSCULAR | Status: DC

## 2022-07-28 MED ORDER — BUPIVACAINE HCL (PF) 0.25 % IJ SOLN
INTRAMUSCULAR | Status: AC
  Filled 2022-07-28: qty 30

## 2022-07-28 MED ORDER — PROPOFOL 10 MG/ML IV BOLUS
INTRAVENOUS | Status: DC | PRN
  Administered 2022-07-28: 100 mg via INTRAVENOUS
  Administered 2022-07-28: 20 mg via INTRAVENOUS

## 2022-07-28 MED ORDER — PROPOFOL 500 MG/50ML IV EMUL
INTRAVENOUS | Status: DC | PRN
  Administered 2022-07-28: 100 ug/kg/min via INTRAVENOUS

## 2022-07-28 MED ORDER — CITALOPRAM HYDROBROMIDE 20 MG PO TABS
40.0000 mg | ORAL_TABLET | Freq: Every day | ORAL | Status: DC
  Administered 2022-07-29 – 2022-07-30 (×2): 40 mg via ORAL
  Filled 2022-07-28 (×2): qty 2

## 2022-07-28 MED ORDER — BUPIVACAINE LIPOSOME 1.3 % IJ SUSP
INTRAMUSCULAR | Status: DC | PRN
  Administered 2022-07-28: 10 mL via PERINEURAL

## 2022-07-28 MED ORDER — CHOLESTYRAMINE LIGHT 4 G PO PACK
4.0000 g | PACK | Freq: Every day | ORAL | Status: DC
  Administered 2022-07-28 – 2022-07-30 (×3): 4 g via ORAL
  Filled 2022-07-28 (×4): qty 1

## 2022-07-28 MED ORDER — ONDANSETRON HCL 4 MG/2ML IJ SOLN: 4.0000 mg | Freq: Once | INTRAMUSCULAR | Status: DC | PRN

## 2022-07-28 MED ORDER — METHOCARBAMOL 1000 MG/10ML IJ SOLN: 500.0000 mg | Freq: Four times a day (QID) | INTRAVENOUS | Status: DC | PRN

## 2022-07-28 MED ORDER — PHENYLEPHRINE 80 MCG/ML (10ML) SYRINGE FOR IV PUSH (FOR BLOOD PRESSURE SUPPORT)
PREFILLED_SYRINGE | INTRAVENOUS | Status: DC | PRN
  Administered 2022-07-28: 120 ug via INTRAVENOUS

## 2022-07-28 MED ORDER — FENTANYL CITRATE PF 50 MCG/ML IJ SOSY
50.0000 ug | PREFILLED_SYRINGE | Freq: Once | INTRAMUSCULAR | Status: AC
  Administered 2022-07-28: 50 ug via INTRAVENOUS
  Filled 2022-07-28: qty 2

## 2022-07-28 MED ORDER — PHENYLEPHRINE HCL-NACL 20-0.9 MG/250ML-% IV SOLN
INTRAVENOUS | Status: DC | PRN
  Administered 2022-07-28: 35 ug/min via INTRAVENOUS

## 2022-07-28 MED ORDER — ROCURONIUM BROMIDE 10 MG/ML (PF) SYRINGE
PREFILLED_SYRINGE | INTRAVENOUS | Status: AC
  Filled 2022-07-28: qty 10

## 2022-07-28 MED ORDER — ORAL CARE MOUTH RINSE: 15.0000 mL | Freq: Once | OROMUCOSAL | Status: AC

## 2022-07-28 MED ORDER — METHOCARBAMOL 500 MG PO TABS: 500.0000 mg | ORAL_TABLET | Freq: Four times a day (QID) | ORAL | Status: DC | PRN

## 2022-07-28 MED ORDER — DILTIAZEM HCL ER COATED BEADS 180 MG PO CP24
180.0000 mg | ORAL_CAPSULE | Freq: Every day | ORAL | Status: DC
  Administered 2022-07-29 – 2022-07-30 (×2): 180 mg via ORAL
  Filled 2022-07-28 (×2): qty 1

## 2022-07-28 MED ORDER — DIPHENHYDRAMINE HCL 12.5 MG/5ML PO ELIX: 12.5000 mg | ORAL_SOLUTION | ORAL | Status: DC | PRN

## 2022-07-28 MED ORDER — ONDANSETRON HCL 4 MG/2ML IJ SOLN
INTRAMUSCULAR | Status: DC | PRN
  Administered 2022-07-28: 4 mg via INTRAVENOUS

## 2022-07-28 MED ORDER — TRAMADOL HCL 50 MG PO TABS
50.0000 mg | ORAL_TABLET | Freq: Four times a day (QID) | ORAL | Status: DC | PRN
  Administered 2022-07-29: 50 mg via ORAL
  Filled 2022-07-28: qty 1

## 2022-07-28 MED ORDER — FENTANYL CITRATE PF 50 MCG/ML IJ SOSY: 25.0000 ug | PREFILLED_SYRINGE | INTRAMUSCULAR | Status: DC | PRN

## 2022-07-28 MED ORDER — CARVEDILOL 12.5 MG PO TABS
12.5000 mg | ORAL_TABLET | Freq: Two times a day (BID) | ORAL | Status: DC
  Administered 2022-07-28 – 2022-07-30 (×4): 12.5 mg via ORAL
  Filled 2022-07-28 (×4): qty 1

## 2022-07-28 MED ORDER — LACTATED RINGERS IV SOLN: INTRAVENOUS | Status: DC

## 2022-07-28 MED ORDER — METOCLOPRAMIDE HCL 5 MG/ML IJ SOLN: 5.0000 mg | Freq: Three times a day (TID) | INTRAMUSCULAR | Status: DC | PRN

## 2022-07-28 MED ORDER — METOCLOPRAMIDE HCL 5 MG PO TABS: 5.0000 mg | ORAL_TABLET | Freq: Three times a day (TID) | ORAL | Status: DC | PRN

## 2022-07-28 MED ORDER — PANTOPRAZOLE SODIUM 40 MG PO TBEC
40.0000 mg | DELAYED_RELEASE_TABLET | Freq: Every day | ORAL | Status: DC
  Administered 2022-07-28 – 2022-07-30 (×3): 40 mg via ORAL
  Filled 2022-07-28 (×3): qty 1

## 2022-07-28 MED ORDER — APIXABAN 2.5 MG PO TABS
2.5000 mg | ORAL_TABLET | Freq: Two times a day (BID) | ORAL | Status: DC
  Administered 2022-07-29 – 2022-07-30 (×3): 2.5 mg via ORAL
  Filled 2022-07-28 (×3): qty 1

## 2022-07-28 MED ORDER — ONDANSETRON HCL 4 MG/2ML IJ SOLN: 4.0000 mg | Freq: Four times a day (QID) | INTRAMUSCULAR | Status: DC | PRN

## 2022-07-28 MED ORDER — ATORVASTATIN CALCIUM 20 MG PO TABS
20.0000 mg | ORAL_TABLET | Freq: Every day | ORAL | Status: DC
  Administered 2022-07-29 – 2022-07-30 (×2): 20 mg via ORAL
  Filled 2022-07-28 (×2): qty 1

## 2022-07-28 SURGICAL SUPPLY — 79 items
APL PRP STRL LF DISP 70% ISPRP (MISCELLANEOUS) ×1
BAG COUNTER SPONGE SURGICOUNT (BAG) ×2 IMPLANT
BAG SPNG CNTER NS LX DISP (BAG) ×1
BIT DRILL 2.9 CANN QC NONSTRL (BIT) IMPLANT
BLADE SURG 15 STRL LF DISP TIS (BLADE) ×2 IMPLANT
BLADE SURG 15 STRL SS (BLADE) ×1
BNDG CMPR 5X4 CHSV STRCH STRL (GAUZE/BANDAGES/DRESSINGS) ×2
BNDG CMPR 9X4 STRL LF SNTH (GAUZE/BANDAGES/DRESSINGS) ×1
BNDG COHESIVE 4X5 TAN STRL LF (GAUZE/BANDAGES/DRESSINGS) ×2 IMPLANT
BNDG ELASTIC 4X5.8 VLCR STR LF (GAUZE/BANDAGES/DRESSINGS) ×4 IMPLANT
BNDG ESMARK 4X9 LF (GAUZE/BANDAGES/DRESSINGS) IMPLANT
BNDG PLASTER X FAST 5X5 WHT LF (CAST SUPPLIES) IMPLANT
BNDG PLSTR 5X5 XFST ST WHT LF (CAST SUPPLIES)
CANISTER SUCT 3000ML PPV (MISCELLANEOUS) ×2 IMPLANT
CHLORAPREP W/TINT 26 (MISCELLANEOUS) ×2 IMPLANT
CLSR STERI-STRIP ANTIMIC 1/2X4 (GAUZE/BANDAGES/DRESSINGS) IMPLANT
COVER BACK TABLE 60X90IN (DRAPES) ×2 IMPLANT
CUFF TOURN SGL QUICK 18X4 (TOURNIQUET CUFF) ×2 IMPLANT
DRAPE EXTREMITY T 121X128X90 (DISPOSABLE) ×2 IMPLANT
DRAPE IMP U-DRAPE 54X76 (DRAPES) ×2 IMPLANT
DRAPE INCISE IOBAN 66X45 STRL (DRAPES) IMPLANT
DRAPE OEC MINIVIEW 54X84 (DRAPES) ×2 IMPLANT
DRAPE U-SHAPE 47X51 STRL (DRAPES) ×2 IMPLANT
DRSG ADAPTIC 3X8 NADH LF (GAUZE/BANDAGES/DRESSINGS) IMPLANT
ELECT REM PT RETURN 15FT ADLT (MISCELLANEOUS) ×2 IMPLANT
GAUZE PAD ABD 8X10 STRL (GAUZE/BANDAGES/DRESSINGS) IMPLANT
GAUZE SPONGE 4X4 12PLY STRL (GAUZE/BANDAGES/DRESSINGS) ×2 IMPLANT
GAUZE XEROFORM 1X8 LF (GAUZE/BANDAGES/DRESSINGS) IMPLANT
GLOVE BIO SURGEON STRL SZ7.5 (GLOVE) ×2 IMPLANT
GLOVE BIOGEL PI IND STRL 8 (GLOVE) ×2 IMPLANT
GOWN SPEC L4 XLG W/TWL (GOWN DISPOSABLE) ×2 IMPLANT
GOWN STRL NON-REIN LRG LVL3 (GOWN DISPOSABLE) ×2 IMPLANT
K-WIRE ACE 1.6X6 (WIRE) ×2
KIT BASIN OR (CUSTOM PROCEDURE TRAY) ×2 IMPLANT
KIT TURNOVER KIT A (KITS) IMPLANT
KWIRE ACE 1.6X6 (WIRE) IMPLANT
NDL HYPO 25X1 1.5 SAFETY (NEEDLE) IMPLANT
NDL MAYO 6 CRC TAPER PT (NEEDLE) IMPLANT
NDL MAYO CATGUT SZ4 TPR NDL (NEEDLE) IMPLANT
NEEDLE HYPO 25X1 1.5 SAFETY (NEEDLE) IMPLANT
NEEDLE MAYO 6 CRC TAPER PT (NEEDLE) IMPLANT
NEEDLE MAYO CATGUT SZ4 (NEEDLE) IMPLANT
NS IRRIG 1000ML POUR BTL (IV SOLUTION) ×2 IMPLANT
PAD CAST 4YDX4 CTTN HI CHSV (CAST SUPPLIES) ×6 IMPLANT
PADDING CAST ABS COTTON 4X4 ST (CAST SUPPLIES) ×2 IMPLANT
PADDING CAST COTTON 4X4 STRL (CAST SUPPLIES) ×2
PADDING CAST COTTON 6X4 STRL (CAST SUPPLIES) IMPLANT
PENCIL SMOKE EVACUATOR (MISCELLANEOUS) ×2 IMPLANT
SCREW ACE CAN 4.0 30M (Screw) IMPLANT
SCREW ACE CAN 4.0 40M (Screw) IMPLANT
SCREW ACE CAN 4.0 50M (Screw) IMPLANT
SLEEVE SCD COMPRESS KNEE MED (STOCKING) IMPLANT
SLING ARM FOAM STRAP LRG (SOFTGOODS) IMPLANT
SLING ARM FOAM STRAP MED (SOFTGOODS) IMPLANT
SPIKE FLUID TRANSFER (MISCELLANEOUS) IMPLANT
SPLINT PLASTER CAST XFAST 5X30 (CAST SUPPLIES) IMPLANT
STOCKINETTE 6  STRL (DRAPES) ×1
STOCKINETTE 6 STRL (DRAPES) IMPLANT
SUCTION FRAZIER HANDLE 10FR (MISCELLANEOUS) ×1
SUCTION TUBE FRAZIER 10FR DISP (MISCELLANEOUS) ×2 IMPLANT
SUT ETHILON 3 0 PS 1 (SUTURE) IMPLANT
SUT FIBERWIRE #2 38 REV NDL BL (SUTURE)
SUT FIBERWIRE #2 38 T-5 BLUE (SUTURE)
SUT MNCRL AB 4-0 PS2 18 (SUTURE) IMPLANT
SUT MON AB 2-0 CT1 36 (SUTURE) IMPLANT
SUT VIC AB 0 CT1 27 (SUTURE) ×1
SUT VIC AB 0 CT1 27XBRD ANBCTR (SUTURE) IMPLANT
SUT VIC AB 0 CT1 27XBRD ANTBC (SUTURE) IMPLANT
SUT VIC AB 2-0 SH 27 (SUTURE)
SUT VIC AB 2-0 SH 27XBRD (SUTURE) IMPLANT
SUT VIC AB 3-0 CT1 27 (SUTURE) ×1
SUT VIC AB 3-0 CT1 TAPERPNT 27 (SUTURE) IMPLANT
SUT VICRYL 3-0 CR8 SH (SUTURE) IMPLANT
SUTURE FIBERWR #2 38 T-5 BLUE (SUTURE) IMPLANT
SUTURE FIBERWR#2 38 REV NDL BL (SUTURE) IMPLANT
SYR BULB EAR ULCER 3OZ GRN STR (SYRINGE) ×2 IMPLANT
TOWEL OR 17X26 10 PK STRL BLUE (TOWEL DISPOSABLE) ×2 IMPLANT
TUBING CONNECTING 10 (TUBING) ×2 IMPLANT
UNDERPAD 30X36 HEAVY ABSORB (UNDERPADS AND DIAPERS) ×2 IMPLANT

## 2022-07-28 NOTE — Telephone Encounter (Signed)
Called and spoke with daughter.  She would like to have Tonya [name] moved directly at discharge from hospital following her surgery to a nursing home but reports that this would need a 72-hour hospital stay which she may not reach.  I will go ahead and put in a referral for [facility] care guide to get her back involved as well as the social work to see if that could help.  I have encouraged Tonya [name] to speak with the social worker in the hospital as well.

## 2022-07-28 NOTE — Anesthesia Procedure Notes (Signed)
Procedure Name: Intubation Date/Time: 07/28/2022 10:24 AM  Performed by: Claudia Desanctis, CRNAPre-anesthesia Checklist: Patient identified, Emergency Drugs available, Suction available and Patient being monitored Patient Re-evaluated:Patient Re-evaluated prior to induction Oxygen Delivery Method: Circle system utilized Preoxygenation: Pre-oxygenation with 100% oxygen Induction Type: IV induction Ventilation: Mask ventilation without difficulty Laryngoscope Size: Mac and 3 Grade View: Grade I Tube type: Oral Tube size: 7.0 mm Number of attempts: 1 Airway Equipment and Method: Stylet Placement Confirmation: ETT inserted through vocal cords under direct vision, positive ETCO2 and breath sounds checked- equal and bilateral Secured at: 21 cm Tube secured with: Tape Dental Injury: Teeth and Oropharynx as per pre-operative assessment

## 2022-07-28 NOTE — Anesthesia Procedure Notes (Signed)
Anesthesia Regional Block: Interscalene brachial plexus block   Pre-Anesthetic Checklist: , timeout performed,  Correct Patient, Correct Site, Correct Laterality,  Correct Procedure, Correct Position, site marked,  Risks and benefits discussed,  Surgical consent,  Pre-op evaluation,  At surgeon's request and post-op pain management  Laterality: Left  Prep: chloraprep       Needles:  Injection technique: Single-shot  Needle Type: Echogenic Needle     Needle Length: 9cm  Needle Gauge: 21     Additional Needles:   Procedures:,,,, ultrasound used (permanent image in chart),,    Narrative:  Start time: 07/28/2022 9:45 AM End time: 07/28/2022 9:52 AM Injection made incrementally with aspirations every 5 mL.  Performed by: Personally  Anesthesiologist: [name], MD  Additional Notes: No pain on injection. No increased resistance to injection. Injection made in 5cc increments.  Good needle visualization.  Patient tolerated procedure well.

## 2022-07-28 NOTE — Op Note (Signed)
07/28/2022  11:21 AM  PATIENT:  Tonya [name] DIAGNOSIS:  LEFT ELBOW FRACTURE WITH LIGAMENT TEAR  POST-OPERATIVE DIAGNOSIS:  Same  PROCEDURE:  OPEN REDUCTION INTERNAL FIXATION (ORIF) ELBOW/OLECRANON FRACTURE WITH LATERAL COLLATERAL LIGAMENT REPAIR AND PIN REMOVAL  SURGEON:  Renette Butters, MD  ASSISTANT: Aggie Moats, PA-C, he was present and scrubbed throughout the case, critical for completion in a timely fashion, and for retraction, instrumentation, and closure.   ANESTHESIA:   gen  PREOPERATIVE INDICATIONS:  Tonya [name] is a  81 y.o. female with a diagnosis of LEFT ELBOW FRACTURE WITH LIGAMENT TEAR who failed conservative measures and elected for surgical management.    The risks benefits and alternatives were discussed with the patient preoperatively including but not limited to the risks of infection, bleeding, nerve injury, cardiopulmonary complications, the need for revision surgery, among others, and the patient was willing to proceed.  OPERATIVE IMPLANTS: biomet screws  OPERATIVE FINDINGS: unstable fracture  BLOOD LOSS: min  COMPLICATIONS: none  TOURNIQUET TIME: 26mn  OPERATIVE PROCEDURE:  Patient was identified in the preoperative holding area and site was marked by me She was transported to the operating theater and placed on the table in supine position taking care to pad all bony prominences. After a preincinduction time out anesthesia was induced. The left upper extremity was prepped and draped in normal sterile fashion and a pre-incision timeout was performed. She received vanc for preoperative antibiotics.   I first made a small incision over her previous olecranon ORIF and dissected down to her K wires prominent K wire was removed.  I also remove the other K wire.  I thoroughly irrigated this incision and closed this in layers.  Next I made a lateral incision and a lateral approach to her elbow and fracture.  I exposed the  extra-articular portion of the fracture that was displaced I then incised longitudinally and exposed the lateral radiocapitellar joint.  I irrigated the hemarthrosis  I then reduced to the lateral condyle fracture and placed 2 K wires to hold this in the place I took multiple x-rays and was happy with the reduction and K wire placement I then selected 2 cannulated screws and inserted these over the K wires  I took 4+ x-rays of the elbow was happy with fracture reduction and K wire placement or sari screw placement.  I then repaired the lateral collateral ligaments with a Vicryl stitch as well as the lateral capsule.  I closed the incision in layers sterile dressing and splint was applied.  POST OPERATIVE PLAN: Mobilize for DVT prophylaxis

## 2022-07-28 NOTE — Transfer of Care (Signed)
Immediate Anesthesia Transfer of Care Note  Patient: Tonya [name]  Procedure(s) Performed: OPEN REDUCTION INTERNAL FIXATION (ORIF) ELBOW/OLECRANON FRACTURE with screws AND PIN REMOVAL (Left: Elbow)  Patient Location: PACU  Anesthesia Type:GA combined with regional for post-op pain  Level of Consciousness: awake and patient cooperative  Airway & Oxygen Therapy: Patient Spontanous Breathing and Patient connected to face mask  Post-op Assessment: Report given to RN and Post -op Vital signs reviewed and stable  Post vital signs: Reviewed and stable  Last Vitals:  Vitals Value Taken Time  BP 120/69 07/28/22 1157  Temp    Pulse 69 07/28/22 1159  Resp 17 07/28/22 1159  SpO2 90 % 07/28/22 1159  Vitals shown include unvalidated device data.  Last Pain:  Vitals:   07/28/22 0954  TempSrc:   PainSc: 0-No pain      Patients Stated Pain Goal: 3 (70/17/79 3903)  Complications: No notable events documented.

## 2022-07-28 NOTE — Plan of Care (Signed)

## 2022-07-28 NOTE — Discharge Instructions (Signed)

## 2022-07-29 ENCOUNTER — Encounter (HOSPITAL_COMMUNITY): Payer: Self-pay | Admitting: Orthopedic Surgery

## 2022-07-29 MED ORDER — LORAZEPAM 2 MG/ML IJ SOLN
1.0000 mg | Freq: Once | INTRAMUSCULAR | Status: AC
  Administered 2022-07-29: 1 mg via INTRAVENOUS
  Filled 2022-07-29: qty 1

## 2022-07-29 MED ORDER — HALOPERIDOL LACTATE 5 MG/ML IJ SOLN: 0.5000 mg | Freq: Once | INTRAMUSCULAR | Status: DC | PRN

## 2022-07-29 NOTE — Progress Notes (Signed)
    Was contacted by patient's RN about change in patient's behavior. Has become very agitated and agressive with her family members. Was found wandering the halls on her own. Confused as to where she is and why she is there. Doesn't know she just had surgery on her elbow. Trying to leave the hospital.   I spoke with [name] who told me she has never seen her mother this agitated before. Likely a combo of recent surgery, medications, new environment, baseline dementia.   Will order a one time dose of IV Ativan. Also ordered one time dose of IV Haldol if the Ativan does not sufficiently control her behavior. I have also placed an order for safety observation so she can be monitored closer during the night.   Dr. Percell Miller agrees that if the agitation and aggressive behavior continues despite the Ativan and/or Haldol then we will need to consult the hospitalist service for their input.   Britt Bottom, PA-C Office [phone] 07/29/2022, 5:54 PM

## 2022-07-29 NOTE — Anesthesia Postprocedure Evaluation (Signed)
Anesthesia Post Note  Patient: Tonya [name]  Procedure(s) Performed: OPEN REDUCTION INTERNAL FIXATION (ORIF) ELBOW/OLECRANON FRACTURE with screws AND PIN REMOVAL (Left: Elbow)     Patient location during evaluation: PACU Anesthesia Type: General Level of consciousness: awake and alert Pain management: pain level controlled Vital Signs Assessment: post-procedure vital signs reviewed and stable Respiratory status: spontaneous breathing, nonlabored ventilation, respiratory function stable and patient connected to nasal cannula oxygen Cardiovascular status: blood pressure returned to baseline and stable Postop Assessment: no apparent nausea or vomiting Anesthetic complications: no   No notable events documented.  Last Vitals:  Vitals:   07/28/22 1812 07/28/22 2143  BP: (!) 142/88 128/81  Pulse: (!) 108 100  Resp: 18 17  Temp: 36.5 C 36.8 C  SpO2: 95% 93%    Last Pain:  Vitals:   07/28/22 2143  TempSrc: Oral  PainSc:                  [name]

## 2022-07-29 NOTE — Evaluation (Signed)
Occupational Therapy Evaluation Patient Details Name: LUCIE FRIEDLANDER MRN: [id number] DOB: 11/12/1940 Today's Date: 07/29/2022   History of Present Illness 81 yo female s/p OPEN REDUCTION INTERNAL FIXATION (ORIF) ELBOW/OLECRANON FRACTURE with screws AND PIN REMOVAL (Left)   Clinical Impression   Mrs. Donnajean Chesnut is an 81 year old woman who presents s/p elbow fracture surgery. At baseline she has short term memory deficits, history of falls, but grossly independent per her unreliable report. She is alert to self and hospital but otherwise not oriented to date. She doesn't think she has had surgery yet. She lives with her daughter - who works from home so cannot be fully accessible during the day. On evaluation she she was min assist to ambulate with hand hold to bathroom and in the room. She needed mod assist for UB bathing and dressing and min assist for LB ADLs. From a functional standpoint she needs increased assistance, she has impaired baseline, and impaired cognition limiting her ability to maintain her weightbearing status. Therapist placed a new larger sling on patient to improve fit and comfort. Patient will benefit from skilled OT services while in hospital to improve deficits and learn compensatory strategies as needed in order to return to PLOF.       Recommendations for follow up therapy are one component of a multi-disciplinary discharge planning process, led by the attending physician.  Recommendations may be updated based on patient status, additional functional criteria and insurance authorization.   Follow Up Recommendations  Skilled nursing-short term rehab (<3 hours/day)    Assistance Recommended at Discharge Frequent or constant Supervision/Assistance  Patient can return home with the following A little help with walking and/or transfers;A little help with bathing/dressing/bathroom;Assistance with cooking/housework;Direct supervision/assist for financial management;Assist for  transportation;Help with stairs or ramp for entrance;Direct supervision/assist for medications management    Functional Status Assessment  Patient has had a recent decline in their functional status and demonstrates the ability to make significant improvements in function in a reasonable and predictable amount of time.  Equipment Recommendations  Other (comment) (Defer)    Recommendations for Other Services       Precautions / Restrictions Precautions Type of Shoulder Precautions: wear sling Shoulder Interventions: Shoulder sling/immobilizer;For comfort;Off for dressing/bathing/exercises Required Braces or Orthoses: Sling;Splint/Cast Splint/Cast: L elbow splint Splint/Cast - Date Prophylactic Dressing Applied (if applicable): 35/00/93 Restrictions Weight Bearing Restrictions: Yes LUE Weight Bearing: Non weight bearing      Mobility Bed Mobility Overal bed mobility: Needs Assistance Bed Mobility: Supine to Sit     Supine to sit: Min assist, HOB elevated     General bed mobility comments: Min assist for had hold to pull up on to transfer out of bed    Transfers Overall transfer level: Needs assistance Equipment used: None Transfers: Sit to/from Stand Sit to Stand: Min guard           General transfer comment: Min guard to stand and min assis to ambulate with hand hold. Patient typically uses a cane at baseline.      Balance Overall balance assessment: Mild deficits observed, not formally tested                                         ADL either performed or assessed with clinical judgement   ADL Overall ADL's : Needs assistance/impaired Eating/Feeding: Set up;Sitting   Grooming: Standing;Min guard;Wash/dry Nurse, mental health Details (indicate  cue type and reason): to wash one hand Upper Body Bathing: Moderate assistance;Sitting   Lower Body Bathing: Minimal assistance;Sit to/from stand   Upper Body Dressing : Moderate assistance;Sitting    Lower Body Dressing: Minimal assistance;Sit to/from stand Lower Body Dressing Details (indicate cue type and reason): able to don socks in seated position, needs assistance to pull clothing up over left hip Toilet Transfer: Regular Toilet;Grab bars;Min guard   Toileting- Clothing Manipulation and Hygiene: Minimal assistance;Sit to/from stand Toileting - Clothing Manipulation Details (indicate cue type and reason): for clothing management     Functional mobility during ADLs: Minimal assistance General ADL Comments: Min assist to ambulate with hand hold in room. Mild LOB with transfer on to toilet.     Vision   Vision Assessment?: No apparent visual deficits     Perception     Praxis      Pertinent Vitals/Pain Pain Assessment Pain Assessment: No/denies pain     Hand Dominance (P) Right   Extremity/Trunk Assessment Upper Extremity Assessment Upper Extremity Assessment: RUE deficits/detail;LUE deficits/detail RUE Deficits / Details: WFL ROM and strength RUE Sensation: WNL RUE Coordination: WNL LUE Deficits / Details: able to grossly open and close fingers, has some shoulder ROM today, elbow and wrist immobilized LUE: Unable to fully assess due to immobilization   Lower Extremity Assessment Lower Extremity Assessment: Defer to PT evaluation   Cervical / Trunk Assessment Cervical / Trunk Assessment: Normal   Communication Communication Communication: (P) No difficulties   Cognition Arousal/Alertness: Awake/alert Behavior During Therapy: WFL for tasks assessed/performed Overall Cognitive Status: History of cognitive impairments - at baseline                                 General Comments: Impaired memory at baseline. Able to report she is in the hospital but stated the wrong city. KNows she did something to her left arm but doesn't think she had surgery yet.     General Comments       Exercises     Shoulder Instructions      Home Living  Family/patient expects to be discharged to:: (P) Skilled nursing facility Living Arrangements: (P) Children                               Additional Comments: (P) lives with daughter, daughter works from home      Prior Functioning/Environment Prior Level of Function : (P) Independent/Modified Independent;History of Falls (last six months)             Mobility Comments: (P) hx of falls, uses a cane ADLs Comments: (P) reports Independence with ADLs        OT Problem List: Decreased strength;Decreased range of motion;Decreased activity tolerance;Impaired balance (sitting and/or standing);Decreased knowledge of precautions;Decreased safety awareness;Decreased knowledge of use of DME or AE;Impaired UE functional use;Pain      OT Treatment/Interventions: Self-care/ADL training;Therapeutic exercise;DME and/or AE instruction;Therapeutic activities;Balance training;Patient/family education    OT Goals(Current goals can be found in the care plan section) Acute Rehab OT Goals OT Goal Formulation: Patient unable to participate in goal setting Time For Goal Achievement: 08/12/22 Potential to Achieve Goals: Good  OT Frequency: Min 2X/week    Co-evaluation              AM-PAC OT "6 Clicks" Daily Activity     Outcome Measure Help from another person eating  meals?: A Little Help from another person taking care of personal grooming?: A Little Help from another person toileting, which includes using toliet, bedpan, or urinal?: A Little Help from another person bathing (including washing, rinsing, drying)?: A Little Help from another person to put on and taking off regular upper body clothing?: A Lot Help from another person to put on and taking off regular lower body clothing?: A Little 6 Click Score: 17   End of Session Equipment Utilized During Treatment: Gait belt Nurse Communication: Mobility status  Activity Tolerance: Patient tolerated treatment well Patient  left: in chair;with call bell/phone within reach;with chair alarm set  OT Visit Diagnosis: Unsteadiness on feet (R26.81);Repeated falls (R29.6)                Time: 5427-0623 OT Time Calculation (min): 17 min Charges:  OT General Charges $OT Visit: 1 Visit OT Evaluation $OT Eval Low Complexity: 1 Low  Gustavo Lah, OTR/L [place]  Office [phone]   Lenward Chancellor 07/29/2022, 11:12 AM

## 2022-07-29 NOTE — Progress Notes (Deleted)
Patient discharged. Discharge instructions reviewed with patient and wife. They both verbalized understanding. IV removed. Stable condition. Patient was given supplies for dressing changes. He and wife are now waiting for cab to come pick them up.

## 2022-07-29 NOTE — Progress Notes (Signed)
Physical Therapy Treatment Patient Details Name: Tonya [name] MRN: [id number] DOB: [birth year] Today's Date: 07/29/2022   History of Present Illness 81 yo female s/p (ORIF) ELBOW/OLECRANON FRACTURE with screws, LATERAL COLLATERAL LIGAMENT REPAIR ,  AND PIN REMOVAL (Left). PMH: DDD, anxiety, lumbar laminectomy, HTN, DDD, OA, L olecranon fx  2022, L TKA and revision, R r TSA, dementia    PT Comments    Pt found wandering in hallway. Assisted safely back to room. Pt able to amb ~ 62' with HHA/RUE support for balance. Continue to recommend SNF to maximize  independence and safety  Recommendations for follow up therapy are one component of a multi-disciplinary discharge planning process, led by the attending physician.  Recommendations may be updated based on patient status, additional functional criteria and insurance authorization.  Follow Up Recommendations  Skilled nursing-short term rehab (<3 hours/day) Can patient physically be transported by private vehicle: Yes   Assistance Recommended at Discharge Intermittent Supervision/Assistance  Patient can return home with the following Help with stairs or ramp for entrance;Assist for transportation;Assistance with cooking/housework;A little help with bathing/dressing/bathroom;A little help with walking and/or transfers;Direct supervision/assist for financial management;Direct supervision/assist for medications management   Equipment Recommendations  Other (comment)    Recommendations for Other Services       Precautions / Restrictions Precautions Precautions: Fall Type of Shoulder Precautions: wear sling Shoulder Interventions: Shoulder sling/immobilizer;For comfort;Off for dressing/bathing/exercises Required Braces or Orthoses: Sling;Splint/Cast Splint/Cast: L elbow splint Splint/Cast - Date Prophylactic Dressing Applied (if applicable): 83/25/49 Restrictions LUE Weight Bearing: Non weight bearing     Mobility  Bed Mobility                General bed mobility comments: found in hallway    Transfers Overall transfer level: Needs assistance Equipment used: None Transfers: Sit to/from Stand Sit to Stand: Supervision           General transfer comment: cues for hand placement and safety on descent    Ambulation/Gait Ambulation/Gait assistance: Min guard Gait Distance (Feet): 55 Feet Assistive device: None, 1 person hand held assist Gait Pattern/deviations: Step-through pattern       General Gait Details: pt found up in hallway with multiple items in her hands, assisted with return to room, R HHA/UE support-min/guard for safety   Stairs             Wheelchair Mobility    Modified Rankin (Stroke Patients Only)       Balance   Sitting-balance support: Feet supported, No upper extremity supported Sitting balance-Leahy Scale: Good       Standing balance-Leahy Scale: Fair Standing balance comment: reliant on R UE support for dynamic tasks                            Cognition Arousal/Alertness: Awake/alert Behavior During Therapy: WFL for tasks assessed/performed Overall Cognitive Status: History of cognitive impairments - at baseline                                 General Comments: pt was abl eto tell me she was in hospital, able to recall details of fall. hx of demenita, cooperative and pleasant        Exercises      General Comments        Pertinent Vitals/Pain Pain Assessment Pain Assessment: No/denies pain    Home Living Family/patient expects to be  discharged to:: Skilled nursing facility Living Arrangements: Children                      Prior Function            PT Goals (current goals can now be found in the care plan section) Acute Rehab PT Goals Patient Stated Goal: family desires SNF per OT and TOC Time For Goal Achievement: 08/12/22 Potential to Achieve Goals: Good Progress towards PT goals: Progressing toward  goals    Frequency    Min 3X/week      PT Plan Current plan remains appropriate    Co-evaluation              AM-PAC PT "6 Clicks" Mobility   Outcome Measure  Help needed turning from your back to your side while in a flat bed without using bedrails?: A Little Help needed moving from lying on your back to sitting on the side of a flat bed without using bedrails?: A Little Help needed moving to and from a bed to a chair (including a wheelchair)?: A Little Help needed standing up from a chair using your arms (e.g., wheelchair or bedside chair)?: A Little Help needed to walk in hospital room?: A Little Help needed climbing 3-5 steps with a railing? : A Little 6 Click Score: 18    End of Session Equipment Utilized During Treatment: Gait belt Activity Tolerance: Patient tolerated treatment well Patient left: in chair;with call bell/phone within reach;with chair alarm set   PT Visit Diagnosis: Other abnormalities of gait and mobility (R26.89);Difficulty in walking, not elsewhere classified (R26.2)     Time: 1450-1506 PT Time Calculation (min) (ACUTE ONLY): 16 min  Charges:  $Gait Training: 8-22 mins                     Baxter Flattery, PT  Acute Rehab Dept [facility]) [phone]  [facility] Weekend Pager [facility] only)  [phone]  07/29/2022    [facility] 07/29/2022, 3:12 PM

## 2022-07-29 NOTE — Progress Notes (Signed)
Subjective: Patient is a poor historian due to dementia. At first, she told me she didn'y have surgery yet. Reports pain as mild.  Tolerating diet.  Urinating.   No CP, SOB. Has not worked with PT/OT on mobilizing OOB yet.  Objective:   VITALS:   Vitals:   07/28/22 1515 07/28/22 1629 07/28/22 1812 07/28/22 2143  BP: (!) 137/94 130/73 (!) 142/88 128/81  Pulse: 96 (!) 101 (!) 108 100  Resp: '15 16 18 17  '$ Temp: 98.3 F (36.8 C) 98.9 F (37.2 C) 97.7 F (36.5 C) 98.3 F (36.8 C)  TempSrc: Oral Oral Oral Oral  SpO2:  94% 95% 93%  Weight: 75.6 kg     Height: '5\' 5"'$  (1.651 m)         Latest Ref Rng & Units 07/17/2022    8:22 PM 01/06/2022    9:13 AM 12/28/2021    4:26 AM  CBC  WBC 4.0 - 10.5 K/uL 12.9  9.7  9.5   Hemoglobin 12.0 - 15.0 g/dL 14.3  12.9  10.6   Hematocrit 36.0 - 46.0 % 39.5  39.2  31.7   Platelets 150 - 400 K/uL 192  254.0  227       Latest Ref Rng & Units 07/28/2022    8:55 AM 03/17/2022    8:06 AM 01/06/2022    9:13 AM  BMP  Glucose 70 - 99 mg/dL 168  121  113   BUN 8 - 23 mg/dL '27  21  28   '$ Creatinine 0.44 - 1.00 mg/dL 1.40  1.38  1.39   Sodium 135 - 145 mmol/L 139  142  141   Potassium 3.5 - 5.1 mmol/L 4.2  4.0  4.5   Chloride 98 - 111 mmol/L 111  106  103   CO2 22 - 32 mmol/L '20  27  28   '$ Calcium 8.9 - 10.3 mg/dL 8.5  9.3  9.7    Intake/Output      10/31 0701 11/01 0700 11/01 0701 11/02 0700   P.O. 480 240   I.V. (mL/kg) 1000 (13.2)    IV Piggyback 500    Total Intake(mL/kg) 1980 (26.2) 240 (3.2)   Urine (mL/kg/hr) 300    Blood 20    Total Output 320    Net +1660 +240        Urine Occurrence 1 x 1 x      Physical Exam: General: NAD.  Sitting up in bed, eating breakfast, calm, pleasant Resp: No increased wob Cardio: regular rate and rhythm ABD soft Neurologically intact MSK Neurovascularly intact Sensation intact distally Intact pulses distally Dorsiflexion/Plantar flexion intact Incision: dressing C/D/I LUE in splint and  sling Can move fingers  Assessment: 1 Day Post-Op  S/P Procedure(s) (LRB): OPEN REDUCTION INTERNAL FIXATION (ORIF) ELBOW/OLECRANON FRACTURE with screws AND PIN REMOVAL (Left) by Dr. Ernesta Amble. Percell Miller on 07/28/22  Principal Problem:   Closed fracture of lateral condyle of left elbow, initial encounter   Plan: Delirium precautions  Advance diet Up with therapy Incentive Spirometry Elevate and Apply ice  Weightbearing: NWB LUE Insicional and dressing care: Dressings left intact until follow-up and Reinforce dressings as needed Orthopedic device(s): Splint and sling Showering: Keep dressing dry VTE prophylaxis:  restart her daily Eliquis 2.'5mg'$  bid today  , SCDs, ambulation Pain control: Tylenol, Tramadol PRN; minimize narcotics due to dementia Follow - up plan: 2 weeks post op Contact information:  Edmonia Lynch MD, Aggie Moats PA-C  Dispo:  TBD based  on PT/OT evaluations. Will most likely need some form of SNF and/or memory care center due to her dementia and risk of recurrent falls . This is the 2nd time she has broken this elbow.      Britt Bottom, PA-C Office [phone] 07/29/2022, 8:42 AM

## 2022-07-29 NOTE — Evaluation (Signed)
Physical Therapy Evaluation Patient Details Name: Tonya [name] MRN: [id number] DOB: Mar 30, 1941 Today's Date: 07/29/2022  History of Present Illness  81 yo female s/p (ORIF) ELBOW/OLECRANON FRACTURE with screws, LATERAL COLLATERAL LIGAMENT REPAIR ,  AND PIN REMOVAL (Left). PMH: DDD, anxiety, lumbar laminectomy, HTN, DDD, OA, L olecranon fx  2022, L TKA and revision, R r TSA, dementia  Clinical Impression  Pt admitted with above diagnosis.  PT with reported incr incidence of falls recently, currently requiring assist for amb and cues for safety. Recommend SNF to improve overall independence. Will follow in acute setting.  Pt currently with functional limitations due to the deficits listed below (see PT Problem List). Pt will benefit from skilled PT to increase their independence and safety with mobility to allow discharge to the venue listed below.          Recommendations for follow up therapy are one component of a multi-disciplinary discharge planning process, led by the attending physician.  Recommendations may be updated based on patient status, additional functional criteria and insurance authorization.  Follow Up Recommendations Skilled nursing-short term rehab (<3 hours/day) Can patient physically be transported by private vehicle: Yes    Assistance Recommended at Discharge Intermittent Supervision/Assistance  Patient can return home with the following  Help with stairs or ramp for entrance;Assist for transportation;Assistance with cooking/housework;A little help with bathing/dressing/bathroom;A little help with walking and/or transfers;Direct supervision/assist for financial management;Direct supervision/assist for medications management    Equipment Recommendations Other (comment) (TBD/defer to SNF)  Recommendations for Other Services       Functional Status Assessment Patient has had a recent decline in their functional status and demonstrates the ability to make significant  improvements in function in a reasonable and predictable amount of time.     Precautions / Restrictions Precautions Precautions: Fall Type of Shoulder Precautions: wear sling Shoulder Interventions: Shoulder sling/immobilizer;For comfort;Off for dressing/bathing/exercises Required Braces or Orthoses: Sling;Splint/Cast Splint/Cast: L elbow splint Splint/Cast - Date Prophylactic Dressing Applied (if applicable): 39/76/73 Restrictions Weight Bearing Restrictions: Yes LUE Weight Bearing: Non weight bearing      Mobility  Bed Mobility               General bed mobility comments: in recliner    Transfers Overall transfer level: Needs assistance Equipment used: Rolling walker (2 wheels) (R hand hold only on walker) Transfers: Sit to/from Stand Sit to Stand: Min guard           General transfer comment: Min guard to stand, cues for hand placement and safety. (Patient typically uses a RW at baseline--?, told OT she amb with cane )    Ambulation/Gait Ambulation/Gait assistance: Min guard, Min assist Gait Distance (Feet): 80 Feet Assistive device: Rolling walker (2 wheels) (R hand hold on RW, PT guiding L side at times) Gait Pattern/deviations: Step-through pattern       General Gait Details: min assist o min/guard to steady. pt without overt LOB  Stairs            Wheelchair Mobility    Modified Rankin (Stroke Patients Only)       Balance   Sitting-balance support: Feet supported, No upper extremity supported Sitting balance-Leahy Scale: Good       Standing balance-Leahy Scale: Fair Standing balance comment: reliant on R UE support for dynamic tasks                             Pertinent Vitals/Pain Pain Assessment  Pain Assessment: No/denies pain    Home Living Family/patient expects to be discharged to:: Skilled nursing facility Living Arrangements: Children                 Additional Comments: (P) lives with daughter,  daughter works from home    Prior Function Prior Level of Function : History of Falls (last six months);Needs assist  Cognitive Assist : Mobility (cognitive)           Mobility Comments: incr in incidence of falls d/t decr memory, pt tells me she uses her walker as long as she rembers to get it ADLs Comments: (P) reports Independence with ADLs     Hand Dominance   Dominant Hand: (P) Right    Extremity/Trunk Assessment   Upper Extremity Assessment Upper Extremity Assessment: Defer to OT evaluation RUE Deficits / Details: WFL ROM and strength RUE Sensation: WNL RUE Coordination: WNL LUE Deficits / Details: able to grossly open and close fingers, has some shoulder ROM today, elbow and wrist immobilized LUE: Unable to fully assess due to immobilization    Lower Extremity Assessment Lower Extremity Assessment: Overall WFL for tasks assessed    Cervical / Trunk Assessment Cervical / Trunk Assessment: Normal  Communication   Communication: No difficulties  Cognition Arousal/Alertness: Awake/alert Behavior During Therapy: WFL for tasks assessed/performed Overall Cognitive Status: History of cognitive impairments - at baseline                                 General Comments: pt was abl eto tell me she was in hospital, able to recall details of fall. hx of demenita, cooperative and pleasant        General Comments      Exercises     Assessment/Plan    PT Assessment Patient needs continued PT services  PT Problem List Decreased mobility;Decreased activity tolerance;Decreased balance;Decreased safety awareness;Decreased cognition       PT Treatment Interventions DME instruction;Therapeutic exercise;Gait training;Functional mobility training;Therapeutic activities;Patient/family education;Balance training    PT Goals (Current goals can be found in the Care Plan section)  Acute Rehab PT Goals Patient Stated Goal: family desires SNF per OT and TOC Time  For Goal Achievement: 08/12/22 Potential to Achieve Goals: Good    Frequency Min 3X/week     Co-evaluation               AM-PAC PT "6 Clicks" Mobility  Outcome Measure Help needed turning from your back to your side while in a flat bed without using bedrails?: A Little Help needed moving from lying on your back to sitting on the side of a flat bed without using bedrails?: A Little Help needed moving to and from a bed to a chair (including a wheelchair)?: A Little Help needed standing up from a chair using your arms (e.g., wheelchair or bedside chair)?: A Little Help needed to walk in hospital room?: A Little Help needed climbing 3-5 steps with a railing? : A Little 6 Click Score: 18    End of Session Equipment Utilized During Treatment: Gait belt Activity Tolerance: Patient tolerated treatment well Patient left: with call bell/phone within reach;in chair;with chair alarm set   PT Visit Diagnosis: Other abnormalities of gait and mobility (R26.89);Difficulty in walking, not elsewhere classified (R26.2)    Time: 6378-5885 PT Time Calculation (min) (ACUTE ONLY): 12 min   Charges:   PT Evaluation $PT Eval Low Complexity: 1 Low  Baxter Flattery, PT  Acute Rehab Dept [facility]) [phone]  [facility] Weekend Pager [facility] only)  [phone]  07/29/2022   [facility] 07/29/2022, 11:51 AM

## 2022-07-29 NOTE — Progress Notes (Signed)
Orthopedic Tech Progress Note Patient Details:  Tonya [name] 04-May-1941 [id number]  Patient ID: Tonya [name], female   DOB: 1941-02-20, 81 y.o.   MRN: [id number]  [name] 07/29/2022, 11:22 AM Sling ordered by PT for patient. Current sling was too small.

## 2022-07-29 NOTE — TOC Initial Note (Addendum)
Transition of Care [facility]) - Initial/Assessment Note    Patient Details  Name: Tonya [name] MRN: [id number] Date of Birth: 12-04-1940  [facility]) CM/SW Contact:    Lennart Pall, LCSW Phone Number: 07/29/2022, 2:54 PM  Clinical Narrative:                 Met with pt and spoke with daughter, Almyra Free, to review dc planning needs.  Pt very pleasant but asks that I speak with daughter.  Daughter reports that she is hopeful pt will qualify for ST SNF for rehab with plans that family will continue to work on longer term plans for ALF/ Memory care.  Have explained to daughter that we can submit for insurance auth of SNF, however, she needs to have "back up" plan if denied as well as working on [employer].    ADDENDUM:  Daughter has accepted SNF bed offer from [facility] and have begun insurance authorization.  Expected Discharge Plan: Skilled Nursing Facility Barriers to Discharge: SNF Pending bed offer   Patient Goals and CMS Choice Patient states their goals for this hospitalization and ongoing recovery are:: SNF rehab      Expected Discharge Plan and Services Expected Discharge Plan: Skilled Nursing Facility In-house Referral: Clinical Social Work   Post Acute Care Choice: [place]                                        Prior Living Arrangements/Services   Lives with:: Adult Children Patient language and need for interpreter reviewed:: Yes Do you feel safe going back to the place where you live?: Yes      Need for Family Participation in Patient Care: Yes (Comment) Care giver support system in place?: Yes (comment)   Criminal Activity/Legal Involvement Pertinent to Current Situation/Hospitalization: No - Comment as needed  Activities of Daily Living Home Assistive Devices/Equipment: Cane (specify quad or straight), Eyeglasses ADL Screening (condition at time of admission) Patient's cognitive ability adequate to safely complete daily  activities?: Yes Is the patient deaf or have difficulty hearing?: Yes Does the patient have difficulty seeing, even when wearing glasses/contacts?: No Does the patient have difficulty concentrating, remembering, or making decisions?: Yes Patient able to express need for assistance with ADLs?: Yes Does the patient have difficulty dressing or bathing?: No Independently performs ADLs?: Yes (appropriate for developmental age) Does the patient have difficulty walking or climbing stairs?: No Weakness of Legs: None Weakness of Arms/Hands: Both  Permission Sought/Granted Permission sought to share information with : Family Supports Permission granted to share information with : Yes, Verbal Permission Granted  Share Information with NAME: [name]     Permission granted to share info w Relationship: daughter  Permission granted to share info w Contact Information: [phone]  Emotional Assessment Appearance:: Appears stated age Attitude/Demeanor/Rapport: Gracious Affect (typically observed): Accepting Orientation: : Oriented to Self, Oriented to Place Alcohol / Substance Use: Not Applicable Psych Involvement: No (comment)  Admission diagnosis:  Closed fracture of lateral condyle of left elbow, initial encounter [S42.452A] Patient Active Problem List   Diagnosis Date Noted   Closed fracture of lateral condyle of left elbow, initial encounter 07/28/2022   Left elbow pain 07/21/2022   Accidental fall 07/21/2022   Thickened nails 07/21/2022   Need for follow-up by social worker 07/21/2022   Osteoporosis 02/02/2022   Moderate dementia ([place]) 01/06/2022   Stage  3b chronic kidney disease (CKD) ([place]) 12/29/2021   Hypokalemia 12/29/2021   Hypomagnesemia 12/29/2021   Hyponatremia 12/26/2021   Cardiomegaly 12/26/2021   Malnutrition of moderate degree 12/26/2021   Hypotension due to hypovolemia 12/25/2021   Dehydration 12/25/2021   Elevated LFTs 12/25/2021   Hematoma 12/25/2021    Saccular aneurysm 09/05/2021   Secondary hyperparathyroidism ([place]) 04/11/2021   Ischemic vascular dementia ([place]) 04/11/2021   Secondary hypoparathyroidism 04/08/2021   Hypertension associated with diabetes ([place]) 01/09/2020   Acquired trigger finger of left ring finger 07/11/2019   Osteoarthritis of both wrists 07/11/2019   Coronary artery disease involving native coronary artery of native heart without angina pectoris 10/24/2018   Senile purpura ([place]) 03/01/2018   History of total replacement of right shoulder joint 11/06/2016   Acute anemia 11/29/2015   Spinal stenosis, lumbar region, with neurogenic claudication 11/26/2015    Class: Chronic   Mixed incontinence urge and stress 10/29/2015   CKD stage 4 due to type 2 diabetes mellitus ([place]) 06/25/2015   Counseling regarding end of life decision making 10/26/2014   S/P total knee replacement using cement 04/03/2014   Atrial fibrillation ([place]) 10/17/2010   Allergic rhinitis 01/23/2009   ADENOCARCINOMA, COLON, CECUM 10/05/2008   Coronary atherosclerosis 11/23/2007   ANXIETY 09/19/2007   Major depressive disorder, recurrent episode, moderate ([place]) 09/19/2007   GERD 09/19/2007   Controlled type 2 diabetes mellitus with diabetic nephropathy ([place]) 03/21/2007   SYNDROME, CARPAL TUNNEL 03/21/2007   Hyperlipidemia associated with type 2 diabetes mellitus ([place]) 03/21/2007   IBS 03/21/2007   Osteoarthrosis, unspecified whether generalized or localized, unspecified site 03/21/2007   PCP:  [name], MD Pharmacy:   [id number] [place], [id number]: [phone]: [phone]    Social Determinants of Health (SDOH) Interventions    Readmission Risk Interventions     No data to display

## 2022-07-29 NOTE — Plan of Care (Signed)
  Problem: Health Behavior/Discharge Planning: Goal: Ability to manage health-related needs will improve Outcome: Progressing   Problem: Clinical Measurements: Goal: Ability to maintain clinical measurements within normal limits will improve Outcome: Progressing Goal: Will remain free from infection Outcome: Progressing Goal: Diagnostic test results will improve Outcome: Progressing Goal: Respiratory complications will improve Outcome: Progressing Goal: Cardiovascular complication will be avoided Outcome: Progressing   Problem: Activity: Goal: Risk for activity intolerance will decrease Outcome: Progressing   Problem: Nutrition: Goal: Adequate nutrition will be maintained Outcome: Progressing   Problem: Pain Managment: Goal: General experience of comfort will improve Outcome: Progressing   Problem: Safety: Goal: Ability to remain free from injury will improve Outcome: Progressing   Problem: Skin Integrity: Goal: Risk for impaired skin integrity will decrease Outcome: Progressing   

## 2022-07-29 NOTE — Progress Notes (Signed)
Patient has become angry. States that she is suppose to be home. She is attempting to get out of bed and is arguing with her family members. Orthopedic PA called and stated that she would consult with hospitalist to figure out next steps.

## 2022-07-30 DIAGNOSIS — Z85038 Personal history of other malignant neoplasm of large intestine: Secondary | ICD-10-CM | POA: Diagnosis not present

## 2022-07-30 DIAGNOSIS — F03911 Unspecified dementia, unspecified severity, with agitation: Secondary | ICD-10-CM | POA: Diagnosis present

## 2022-07-30 DIAGNOSIS — N390 Urinary tract infection, site not specified: Secondary | ICD-10-CM | POA: Diagnosis not present

## 2022-07-30 DIAGNOSIS — Z9181 History of falling: Secondary | ICD-10-CM | POA: Diagnosis not present

## 2022-07-30 DIAGNOSIS — S42452A Displaced fracture of lateral condyle of left humerus, initial encounter for closed fracture: Secondary | ICD-10-CM | POA: Diagnosis present

## 2022-07-30 DIAGNOSIS — M9742XA Periprosthetic fracture around internal prosthetic left elbow joint, initial encounter: Secondary | ICD-10-CM | POA: Diagnosis not present

## 2022-07-30 DIAGNOSIS — I4891 Unspecified atrial fibrillation: Secondary | ICD-10-CM | POA: Diagnosis present

## 2022-07-30 DIAGNOSIS — R404 Transient alteration of awareness: Secondary | ICD-10-CM | POA: Diagnosis not present

## 2022-07-30 DIAGNOSIS — W19XXXA Unspecified fall, initial encounter: Secondary | ICD-10-CM | POA: Diagnosis present

## 2022-07-30 DIAGNOSIS — R278 Other lack of coordination: Secondary | ICD-10-CM | POA: Diagnosis not present

## 2022-07-30 DIAGNOSIS — S5322XA Traumatic rupture of left radial collateral ligament, initial encounter: Secondary | ICD-10-CM | POA: Diagnosis not present

## 2022-07-30 DIAGNOSIS — Z833 Family history of diabetes mellitus: Secondary | ICD-10-CM | POA: Diagnosis not present

## 2022-07-30 DIAGNOSIS — M6281 Muscle weakness (generalized): Secondary | ICD-10-CM | POA: Diagnosis not present

## 2022-07-30 DIAGNOSIS — K219 Gastro-esophageal reflux disease without esophagitis: Secondary | ICD-10-CM | POA: Diagnosis present

## 2022-07-30 DIAGNOSIS — D508 Other iron deficiency anemias: Secondary | ICD-10-CM | POA: Diagnosis not present

## 2022-07-30 DIAGNOSIS — S52022D Displaced fracture of olecranon process without intraarticular extension of left ulna, subsequent encounter for closed fracture with routine healing: Secondary | ICD-10-CM | POA: Diagnosis not present

## 2022-07-30 DIAGNOSIS — Z96611 Presence of right artificial shoulder joint: Secondary | ICD-10-CM | POA: Diagnosis present

## 2022-07-30 DIAGNOSIS — I10 Essential (primary) hypertension: Secondary | ICD-10-CM | POA: Diagnosis not present

## 2022-07-30 DIAGNOSIS — Z8249 Family history of ischemic heart disease and other diseases of the circulatory system: Secondary | ICD-10-CM | POA: Diagnosis not present

## 2022-07-30 DIAGNOSIS — Z9049 Acquired absence of other specified parts of digestive tract: Secondary | ICD-10-CM | POA: Diagnosis not present

## 2022-07-30 DIAGNOSIS — D649 Anemia, unspecified: Secondary | ICD-10-CM | POA: Diagnosis not present

## 2022-07-30 DIAGNOSIS — Z83719 Family history of colon polyps, unspecified: Secondary | ICD-10-CM | POA: Diagnosis not present

## 2022-07-30 DIAGNOSIS — Z96653 Presence of artificial knee joint, bilateral: Secondary | ICD-10-CM | POA: Diagnosis present

## 2022-07-30 DIAGNOSIS — E785 Hyperlipidemia, unspecified: Secondary | ICD-10-CM | POA: Diagnosis not present

## 2022-07-30 DIAGNOSIS — E119 Type 2 diabetes mellitus without complications: Secondary | ICD-10-CM | POA: Diagnosis present

## 2022-07-30 DIAGNOSIS — R296 Repeated falls: Secondary | ICD-10-CM | POA: Diagnosis not present

## 2022-07-30 DIAGNOSIS — E118 Type 2 diabetes mellitus with unspecified complications: Secondary | ICD-10-CM | POA: Diagnosis not present

## 2022-07-30 DIAGNOSIS — Z743 Need for continuous supervision: Secondary | ICD-10-CM | POA: Diagnosis not present

## 2022-07-30 DIAGNOSIS — Z79899 Other long term (current) drug therapy: Secondary | ICD-10-CM | POA: Diagnosis not present

## 2022-07-30 DIAGNOSIS — Z7901 Long term (current) use of anticoagulants: Secondary | ICD-10-CM | POA: Diagnosis not present

## 2022-07-30 DIAGNOSIS — S52032D Displaced fracture of olecranon process with intraarticular extension of left ulna, subsequent encounter for closed fracture with routine healing: Secondary | ICD-10-CM | POA: Diagnosis not present

## 2022-07-30 DIAGNOSIS — Z955 Presence of coronary angioplasty implant and graft: Secondary | ICD-10-CM | POA: Diagnosis not present

## 2022-07-30 DIAGNOSIS — Z8042 Family history of malignant neoplasm of prostate: Secondary | ICD-10-CM | POA: Diagnosis not present

## 2022-07-30 DIAGNOSIS — R2689 Other abnormalities of gait and mobility: Secondary | ICD-10-CM | POA: Diagnosis not present

## 2022-07-30 MED ORDER — BACLOFEN 10 MG PO TABS: 10.0000 mg | ORAL_TABLET | Freq: Two times a day (BID) | ORAL | 0 refills | Status: AC | PRN

## 2022-07-30 MED ORDER — TRAMADOL HCL 50 MG PO TABS: 50.0000 mg | ORAL_TABLET | Freq: Every day | ORAL | 0 refills | Status: AC | PRN

## 2022-07-30 MED ORDER — ACETAMINOPHEN 325 MG PO TABS: 650.0000 mg | ORAL_TABLET | Freq: Four times a day (QID) | ORAL | 0 refills | Status: AC | PRN

## 2022-07-30 NOTE — TOC Transition Note (Signed)
Transition of Care [facility]) - CM/SW Discharge Note   Patient Details  Name: JETTIE MANNOR MRN: [id number] Date of Birth: [birth year]  Transition of Care [facility]) CM/SW Contact:  Lennart Pall, LCSW Phone Number: 07/30/2022, 2:27 PM   Clinical Narrative:    Have received insurance authorization for SNF and pt/daughter have chosen bed at [facility] who can admit pt today.  PTAR called at [phone].  RN to call report to [phone].  No further TOC needs.   Final next level of care: Skilled Nursing Facility Barriers to Discharge: Barriers Resolved   Patient Goals and CMS Choice Patient states their goals for this hospitalization and ongoing recovery are:: SNF rehab      Discharge Placement   Existing PASRR number confirmed : 07/29/22          Patient chooses bed at: [facility] Patient to be transferred to facility by: [place] Name of family member notified: daughter Patient and family notified of of transfer: 07/30/22  Discharge Plan and Services In-house Referral: Clinical Social Work   Post Acute Care Choice: [place]                               Social Determinants of Health (SDOH) Interventions     Readmission Risk Interventions     No data to display

## 2022-07-30 NOTE — Discharge Summary (Signed)
Physician Discharge Summary  Patient ID: Tonya [name] MRN: [id number] DOB/AGE: 82/27/1942 82 y.o.  Admit date: 07/28/2022 Discharge date: 07/30/2022  Admission Diagnoses:  Discharge Diagnoses:  Principal Problem:   Closed fracture of lateral condyle of left elbow, initial encounter Active Problems:   Closed fracture of lateral condyle of left elbow   Discharged Condition: fair  Hospital Course: Patient underwent a left elbow ORIF by Dr. Percell Miller on 50/03/70 without complications. She spent 2 nights in the hospital for pain control, delirium issues, and mobilization. She has a bed at a SNF and is ready for discharge.  Consults: None  Significant Diagnostic Studies: n/a  Treatments: IV hydration, antibiotics: Ancef, analgesia: acetaminophen and Tramadol, anticoagulation: Eliquis, therapies: PT, OT, and SW, and surgery: left elbow ORIF  Discharge Exam: Blood pressure (!) 142/88, pulse (!) 104, temperature 98.3 F (36.8 C), temperature source Oral, resp. rate 18, height _0  (1.651 m), weight 75.6 kg, SpO2 97 %. General appearance: alert, cooperative, and no distress Head: B/L periocular ecchymosis Resp: clear to auscultation bilaterally Cardio: regular rate and rhythm, S1, S2 normal, no murmur, click, rub or gallop Extremities: extremities normal, atraumatic, no cyanosis or edema Pulses:  L brachial 2+ R brachial 2+  L radial 2+ R radial 2+  L inguinal 2+ R inguinal 2+  L popliteal 2+ R popliteal 2+  L posterior tibial 2+ R posterior tibial 2+  L dorsalis pedis 2+ R dorsalis pedis 2+   Neurologic: Grossly normal Incision/Wound: c/d/i  Disposition: Discharge disposition: 03-Skilled [place]       Discharge Instructions     Call MD / Call 911   Complete by: As directed    If you experience chest pain or shortness of breath, CALL 911 and be transported to the hospital emergency room.  If you develope a fever above 101 F, pus (white drainage) or increased  drainage or redness at the wound, or calf pain, call your surgeon's office.   Diet - low sodium heart healthy   Complete by: As directed    Discharge instructions   Complete by: As directed    Maintain sling and splint until follow up.  Diet: As you were doing prior to hospitalization   Dressing:  Keep dressings on and dry. If the elastic Ace wrap feels too tight, you can loosen it.  Activity:  Increase activity slowly as tolerated, but follow the weight bearing instructions below.  The rules on driving is that you can not be taking narcotics while you drive, and you must feel in control of the vehicle.    Weight Bearing:  Do not lift or bear weight with affected arm.  You may move at the shoulder and wrist but not the elbow.   Medicines:  - Tylenol is for mild and moderate pain relief. - Tramadol is a narcotic for severe pain relief. Take this as little as possible and stop it as soon as possible.  - Baclofen is for muscle spasms. This medicine can make you drowsy. - Zofran is for nausea and vomiting.   - Eliquis is to prevent blood clots after surgery.   Constipation: Narcotic pain medications cause constipation.  Reduce use or stop taking if you become constipated.  Drink plenty of fluids (prune juice may be helpful) and high fiber foods.  You may use a stool softener such as -  Colace (over the counter) 100 mg by mouth twice a day  And/or Miralax (over the counter) for constipation as needed.  Itching:  If you experience itching with your medications, try taking only a single pain pill, or even half a pain pill at a time.  You can also use benadryl over the counter for itching or also to help with sleep.   Precautions:  If you experience chest pain or shortness of breath - call 911 immediately for transfer to the hospital emergency department!!  If you develop a fever greater that 101 F, purulent drainage from wound, increased redness or drainage from wound, or calf pain -- Call  the office at [phone]                                                 Follow- Up Appointment:  Please call for an appointment to be seen in 7-10 days   - [phone]   Lifting restrictions   Complete by: As directed    No lifting for 4-6 weeks   Non weight bearing   Complete by: As directed    Laterality: left   Extremity: Upper   Post-operative opioid taper instructions:   Complete by: As directed    POST-OPERATIVE OPIOID TAPER INSTRUCTIONS: It is important to wean off of your opioid medication as soon as possible. If you do not need pain medication after your surgery it is ok to stop day one. Opioids include: Codeine, Hydrocodone(Norco, Vicodin), Oxycodone(Percocet, oxycontin) and hydromorphone amongst others.  Long term and even short term use of opiods can cause: Increased pain response Dependence Constipation Depression Respiratory depression And more.  Withdrawal symptoms can include Flu like symptoms Nausea, vomiting And more Techniques to manage these symptoms Hydrate well Eat regular healthy meals Stay active Use relaxation techniques(deep breathing, meditating, yoga) Do Not substitute Alcohol to help with tapering If you have been on opioids for less than two weeks and do not have pain than it is ok to stop all together.  Plan to wean off of opioids This plan should start within one week post op of your joint replacement. Maintain the same interval or time between taking each dose and first decrease the dose.  Cut the total daily intake of opioids by one tablet each day Next start to increase the time between doses. The last dose that should be eliminated is the evening dose.         Allergies as of 07/30/2022       Reactions   Amoxicillin-pot Clavulanate Swelling, Other (See Comments)   Augmentin per patient, tongue swelling   Nitrofurantoin Diarrhea   Nausea, jaundice, liver enzymes abnormal.        Medication List     TAKE these  medications    Accu-Chek Aviva Plus w/Device Kit Use to check blood once sugar daily.   acetaminophen 325 MG tablet Commonly known as: Tylenol Take 2 tablets (650 mg total) by mouth every 6 (six) hours as needed for mild pain or moderate pain.   atorvastatin 20 MG tablet Commonly known as: LIPITOR Take 1 tablet (20 mg total) by mouth daily.   baclofen 10 MG tablet Commonly known as: LIORESAL Take 1 tablet (10 mg total) by mouth 2 (two) times daily as needed for muscle spasms.   CALCIUM 600 + D PO Take 1 tablet by mouth daily.   carvedilol 12.5 MG tablet Commonly known as: COREG TAKE 1 TABLET (12.5MG TOTAL) BY MOUTH TWICE A  DAY WITH MEALS   cetirizine 10 MG tablet Commonly known as: ZYRTEC Take 10 mg by mouth at bedtime.   cholestyramine light 4 g packet Commonly known as: PREVALITE USE 1 PACKET TWICE DAILY WITH A MEAL, AT LEAST 2 HOURS AWAY FROM OTHER MEDS PATIENT NEEDS OFFICE VISIT What changed: See the new instructions.   citalopram 40 MG tablet Commonly known as: CELEXA TAKE 1 TABLET BY MOUTH EVERY DAY   diltiazem 180 MG 24 hr capsule Commonly known as: CARDIZEM CD TAKE 1 CAPSULE BY MOUTH EVERY DAY What changed: how much to take   Eliquis 2.5 MG Tabs tablet Generic drug: apixaban TAKE 1 TABLET BY MOUTH TWICE A DAY What changed: how much to take   ferrous sulfate 325 (65 FE) MG EC tablet Take 325 mg by mouth 2 (two) times daily.   glucose blood test strip Commonly known as: Accu-Chek Aviva Plus Use to check blood sugar daily.   isosorbide mononitrate 30 MG 24 hr tablet Commonly known as: IMDUR TAKE 1 TABLET BY MOUTH EVERY DAY   losartan 100 MG tablet Commonly known as: COZAAR TAKE 1 TABLET BY MOUTH EVERY DAY   MAGNESIUM 27 PO Take 500 mg by mouth daily.   omeprazole 20 MG capsule Commonly known as: PRILOSEC TAKE 1 CAPSULE BY MOUTH EVERY DAY What changed: how much to take   ondansetron 4 MG disintegrating tablet Commonly known as:  ZOFRAN-ODT Take 1 tablet (4 mg total) by mouth every 8 (eight) hours as needed.   traMADol 50 MG tablet Commonly known as: Ultram Take 1 tablet (50 mg total) by mouth daily as needed for severe pain.   Vitamin D (Ergocalciferol) 1.25 MG (50000 UNIT) Caps capsule Commonly known as: DRISDOL TAKE 1 CAPSULE (50,000 UNITS TOTAL) BY MOUTH EVERY 7 (SEVEN) DAYS               Discharge Care Instructions  (From admission, onward)           Start     Ordered   07/30/22 0000  Non weight bearing       Question Answer Comment  Laterality left   Extremity Upper      07/30/22 1259            Follow-up Information     Renette Butters, MD. Schedule an appointment as soon as possible for a visit in 1 week(s).   Specialty: Orthopedic Surgery Contact information: [address] [place] [address] [phone]                 Signed: Britt Bottom PA-C 07/30/2022, 1:00 PM

## 2022-07-30 NOTE — Progress Notes (Signed)
Pt has DC order, AVS was printed and kept in the DC pocket. Called report to Rhosh[place], LPN of [employer] Pending ambulance to DC pt.

## 2022-07-30 NOTE — NC FL2 (Signed)
[place] LEVEL OF CARE SCREENING TOOL     IDENTIFICATION  Patient Name: Tonya [name] Birthdate: [birth year] Sex: female Admission Date (Current Location): 07/28/2022  [facility] and [place] Number:  Herbalist and Address:  [facility],  [place], [place]      Provider Number: [id number]  Attending Physician Name and Address:  Renette Butters, MD  Relative Name and Phone Number:  daughter, [name]    Current Level of Care: Hospital Recommended Level of Care: [place] Prior Approval Number:    Date Approved/Denied:   PASRR Number: [id number]  Discharge Plan: SNF    Current Diagnoses: Patient Active Problem List   Diagnosis Date Noted   Closed fracture of lateral condyle of left elbow, initial encounter 07/28/2022   Left elbow pain 07/21/2022   Accidental fall 07/21/2022   Thickened nails 07/21/2022   Need for follow-up by social worker 07/21/2022   Osteoporosis 02/02/2022   Moderate dementia ([place]) 01/06/2022   Stage 3b chronic kidney disease (CKD) ([place]) 12/29/2021   Hypokalemia 12/29/2021   Hypomagnesemia 12/29/2021   Hyponatremia 12/26/2021   Cardiomegaly 12/26/2021   Malnutrition of moderate degree 12/26/2021   Hypotension due to hypovolemia 12/25/2021   Dehydration 12/25/2021   Elevated LFTs 12/25/2021   Hematoma 12/25/2021   Saccular aneurysm 09/05/2021   Secondary hyperparathyroidism ([place]) 04/11/2021   Ischemic vascular dementia ([place]) 04/11/2021   Secondary hypoparathyroidism 04/08/2021   Hypertension associated with diabetes ([place]) 01/09/2020   Acquired trigger finger of left ring finger 07/11/2019   Osteoarthritis of both wrists 07/11/2019   Coronary artery disease involving native coronary artery of native heart without angina pectoris 10/24/2018   Senile purpura ([place]) 03/01/2018   History of total replacement of right shoulder joint 11/06/2016   Acute anemia 11/29/2015    Spinal stenosis, lumbar region, with neurogenic claudication 11/26/2015   Mixed incontinence urge and stress 10/29/2015   CKD stage 4 due to type 2 diabetes mellitus ([place]) 06/25/2015   Counseling regarding end of life decision making 10/26/2014   S/P total knee replacement using cement 04/03/2014   Atrial fibrillation ([place]) 10/17/2010   Allergic rhinitis 01/23/2009   ADENOCARCINOMA, COLON, CECUM 10/05/2008   Coronary atherosclerosis 11/23/2007   ANXIETY 09/19/2007   Major depressive disorder, recurrent episode, moderate ([place]) 09/19/2007   GERD 09/19/2007   Controlled type 2 diabetes mellitus with diabetic nephropathy ([place]) 03/21/2007   SYNDROME, CARPAL TUNNEL 03/21/2007   Hyperlipidemia associated with type 2 diabetes mellitus ([place]) 03/21/2007   IBS 03/21/2007   Osteoarthrosis, unspecified whether generalized or localized, unspecified site 03/21/2007    Orientation RESPIRATION BLADDER Height & Weight     Self, Place  Normal Continent Weight: 166 lb 9.8 oz (75.6 kg) Height:  '5\' 5"'$  (165.1 cm)  BEHAVIORAL SYMPTOMS/MOOD NEUROLOGICAL BOWEL NUTRITION STATUS      Continent    AMBULATORY STATUS COMMUNICATION OF NEEDS Skin   Limited Assist Verbally Other (Comment) (surgical incision only)                       Personal Care Assistance Level of Assistance  Bathing, Dressing Bathing Assistance: Limited assistance   Dressing Assistance: Limited assistance     Functional Limitations Info             SPECIAL CARE FACTORS FREQUENCY  PT (By licensed PT), OT (By licensed OT)     PT Frequency: 5x/wk OT Frequency: 5x/wk  Contractures Contractures Info: Not present    Additional Factors Info  Code Status, Allergies, Psychotropic Code Status Info: Full Allergies Info: Amoxicillin-pot Clavulanate, Nitrofurantoin Psychotropic Info: see MAR         Current Medications (07/30/2022):  This is the current hospital active medication list Current  Facility-Administered Medications  Medication Dose Route Frequency Provider Last Rate Last Admin   acetaminophen (TYLENOL) tablet 325-650 mg  325-650 mg Oral Q6H PRN Gawne, Meghan M, PA-C       apixaban Arne [place]) tablet 2.5 mg  2.5 mg Oral BID Gawne, Meghan M, PA-C   2.5 mg at 07/29/22 2138   atorvastatin (LIPITOR) tablet 20 mg  20 mg Oral Daily Aggie Moats M, PA-C   20 mg at 07/29/22 9179   bisacodyl (DULCOLAX) suppository 10 mg  10 mg Rectal Daily PRN Gawne, Meghan M, PA-C       carvedilol (COREG) tablet 12.5 mg  12.5 mg Oral BID WC Aggie Moats M, PA-C   12.5 mg at 07/29/22 0913   cholestyramine light (PREVALITE) packet 4 g  4 g Oral Daily Aggie Moats M, PA-C   4 g at 07/29/22 0913   citalopram (CELEXA) tablet 40 mg  40 mg Oral Daily Aggie Moats M, PA-C   40 mg at 07/29/22 0913   diltiazem (CARDIZEM CD) 24 hr capsule 180 mg  180 mg Oral Daily Aggie Moats M, PA-C   180 mg at 07/29/22 0912   diphenhydrAMINE (BENADRYL) 12.5 MG/5ML elixir 12.5-25 mg  12.5-25 mg Oral Q4H PRN Gawne, Meghan M, PA-C       docusate sodium (COLACE) capsule 100 mg  100 mg Oral BID Aggie Moats M, PA-C   100 mg at 07/28/22 2121   ferrous sulfate tablet 325 mg  325 mg Oral BID Aggie Moats M, PA-C   325 mg at 07/29/22 1505   haloperidol lactate (HALDOL) injection 0.5 mg  0.5 mg Intravenous Once PRN Aggie Moats M, PA-C       isosorbide mononitrate (IMDUR) 24 hr tablet 30 mg  30 mg Oral Daily Aggie Moats M, PA-C   30 mg at 07/29/22 0913   loratadine (CLARITIN) tablet 10 mg  10 mg Oral QHS Aggie Moats M, PA-C   10 mg at 07/29/22 2138   losartan (COZAAR) tablet 100 mg  100 mg Oral Daily Aggie Moats M, PA-C   100 mg at 07/29/22 0913   methocarbamol (ROBAXIN) tablet 500 mg  500 mg Oral Q6H PRN Aggie Moats M, PA-C       Or   methocarbamol (ROBAXIN) 500 mg in dextrose 5 % 50 mL IVPB  500 mg Intravenous Q6H PRN Gawne, Meghan M, PA-C       metoCLOPramide (REGLAN) tablet 5-10 mg  5-10 mg Oral Q8H PRN Gawne,  Meghan M, PA-C       Or   metoCLOPramide (REGLAN) injection 5-10 mg  5-10 mg Intravenous Q8H PRN Gawne, Meghan M, PA-C       ondansetron (ZOFRAN) tablet 4 mg  4 mg Oral Q6H PRN Gawne, Meghan M, PA-C       Or   ondansetron [facility]) injection 4 mg  4 mg Intravenous Q6H PRN Gawne, Meghan M, PA-C       pantoprazole (PROTONIX) EC tablet 40 mg  40 mg Oral Daily Aggie Moats M, PA-C   40 mg at 07/29/22 0913   polyethylene glycol (MIRALAX / GLYCOLAX) packet 17 g  17 g Oral Daily PRN Britt Bottom, PA-C  traMADol (ULTRAM) tablet 50 mg  50 mg Oral Q6H PRN Aggie Moats M, PA-C   50 mg at 07/29/22 5638     Discharge Medications: Please see discharge summary for a list of discharge medications.  Relevant Imaging Results:  Relevant Lab Results:   Additional Information SS# [id number]  Lennart Pall, LCSW

## 2022-07-30 NOTE — Progress Notes (Signed)
Subjective: Patient is a poor historian due to dementia. Very pleasant today. Reports pain as mild.  Tolerating diet.  Urinating.   No CP, SOB. Worked some with PT/OT. Has had some issues with agitation and confusion. I ordered a dose of Ativan last night due to this.  Objective:   VITALS:   Vitals:   07/29/22 2100 07/29/22 2139 07/30/22 0648 07/30/22 0844  BP: 133/88 133/88 (!) 163/114 (!) 142/88  Pulse: (!) 104 (!) 104 (!) 51 (!) 104  Resp: '18 18 16 18  '$ Temp: 98.7 F (37.1 C) 98.7 F (37.1 C) 97.6 F (36.4 C) 98.3 F (36.8 C)  TempSrc: Oral Oral Oral Oral  SpO2: 92% 92% 96% 97%  Weight:      Height:          Latest Ref Rng & Units 07/17/2022    8:22 PM 01/06/2022    9:13 AM 12/28/2021    4:26 AM  CBC  WBC 4.0 - 10.5 K/uL 12.9  9.7  9.5   Hemoglobin 12.0 - 15.0 g/dL 14.3  12.9  10.6   Hematocrit 36.0 - 46.0 % 39.5  39.2  31.7   Platelets 150 - 400 K/uL 192  254.0  227       Latest Ref Rng & Units 07/28/2022    8:55 AM 03/17/2022    8:06 AM 01/06/2022    9:13 AM  BMP  Glucose 70 - 99 mg/dL 168  121  113   BUN 8 - 23 mg/dL '27  21  28   '$ Creatinine 0.44 - 1.00 mg/dL 1.40  1.38  1.39   Sodium 135 - 145 mmol/L 139  142  141   Potassium 3.5 - 5.1 mmol/L 4.2  4.0  4.5   Chloride 98 - 111 mmol/L 111  106  103   CO2 22 - 32 mmol/L '20  27  28   '$ Calcium 8.9 - 10.3 mg/dL 8.5  9.3  9.7    Intake/Output      11/01 0701 11/02 0700 11/02 0701 11/03 0700   P.O. 420 240   I.V. (mL/kg)     IV Piggyback 0    Total Intake(mL/kg) 420 (5.6) 240 (3.2)   Urine (mL/kg/hr) 600 (0.3)    Stool 0    Blood     Total Output 600    Net -180 +240        Urine Occurrence 1 x 1 x   Stool Occurrence 0 x 1 x      Physical Exam: General: NAD.  Sitting up in bed, eating lunch, calm, pleasant Resp: No increased wob Cardio: regular rate and rhythm ABD soft Neurologically intact MSK Neurovascularly intact Sensation intact distally Intact pulses distally Dorsiflexion/Plantar flexion  intact Incision: dressing C/D/I LUE in splint and sling Can move fingers  Assessment: 2 Days Post-Op  S/P Procedure(s) (LRB): OPEN REDUCTION INTERNAL FIXATION (ORIF) ELBOW/OLECRANON FRACTURE with screws AND PIN REMOVAL (Left) by Dr. Ernesta Amble. Percell Miller on 07/28/22  Principal Problem:   Closed fracture of lateral condyle of left elbow, initial encounter Active Problems:   Closed fracture of lateral condyle of left elbow   Plan: Delirium precautions  Haldol PRN  Advance diet Up with therapy Incentive Spirometry Elevate and Apply ice  Weightbearing: NWB LUE Insicional and dressing care: Dressings left intact until follow-up and Reinforce dressings as needed Orthopedic device(s): Splint and sling Showering: Keep dressing dry VTE prophylaxis:  her daily Eliquis 2.'5mg'$  bid  , SCDs, ambulation Pain  control: Tylenol, Tramadol PRN; minimize narcotics due to dementia Follow - up plan: 2 weeks post op Contact information:  Edmonia Lynch MD, Aggie Moats PA-C  Dispo: PT/OT recommending SNF. Awaiting approval. Will print and sign discharge meds.     Britt Bottom, PA-C Office [phone] 07/30/2022, 12:38 PM

## 2022-07-31 DIAGNOSIS — I10 Essential (primary) hypertension: Secondary | ICD-10-CM | POA: Diagnosis not present

## 2022-07-31 DIAGNOSIS — M9742XA Periprosthetic fracture around internal prosthetic left elbow joint, initial encounter: Secondary | ICD-10-CM | POA: Diagnosis not present

## 2022-07-31 DIAGNOSIS — D649 Anemia, unspecified: Secondary | ICD-10-CM | POA: Diagnosis not present

## 2022-07-31 DIAGNOSIS — D508 Other iron deficiency anemias: Secondary | ICD-10-CM | POA: Diagnosis not present

## 2022-07-31 DIAGNOSIS — S52022D Displaced fracture of olecranon process without intraarticular extension of left ulna, subsequent encounter for closed fracture with routine healing: Secondary | ICD-10-CM | POA: Diagnosis not present

## 2022-07-31 DIAGNOSIS — M6281 Muscle weakness (generalized): Secondary | ICD-10-CM | POA: Diagnosis not present

## 2022-07-31 DIAGNOSIS — R296 Repeated falls: Secondary | ICD-10-CM | POA: Diagnosis not present

## 2022-07-31 DIAGNOSIS — Z9181 History of falling: Secondary | ICD-10-CM | POA: Diagnosis not present

## 2022-07-31 DIAGNOSIS — E118 Type 2 diabetes mellitus with unspecified complications: Secondary | ICD-10-CM | POA: Diagnosis not present

## 2022-07-31 DIAGNOSIS — E785 Hyperlipidemia, unspecified: Secondary | ICD-10-CM | POA: Diagnosis not present

## 2022-07-31 NOTE — Progress Notes (Signed)
[facility] here to transport the patient. Vital Signs obtained/recorded. Patient left with [facility] for [facility] and daughter Almyra Free made aware per her request.

## 2022-08-03 ENCOUNTER — Telehealth: Payer: Self-pay | Admitting: *Deleted

## 2022-08-03 ENCOUNTER — Encounter: Payer: Self-pay | Admitting: Family Medicine

## 2022-08-03 DIAGNOSIS — R296 Repeated falls: Secondary | ICD-10-CM | POA: Diagnosis not present

## 2022-08-03 DIAGNOSIS — M6281 Muscle weakness (generalized): Secondary | ICD-10-CM | POA: Diagnosis not present

## 2022-08-03 DIAGNOSIS — D508 Other iron deficiency anemias: Secondary | ICD-10-CM | POA: Diagnosis not present

## 2022-08-03 DIAGNOSIS — M9742XA Periprosthetic fracture around internal prosthetic left elbow joint, initial encounter: Secondary | ICD-10-CM | POA: Diagnosis not present

## 2022-08-03 DIAGNOSIS — I10 Essential (primary) hypertension: Secondary | ICD-10-CM | POA: Diagnosis not present

## 2022-08-03 DIAGNOSIS — E118 Type 2 diabetes mellitus with unspecified complications: Secondary | ICD-10-CM | POA: Diagnosis not present

## 2022-08-03 NOTE — Progress Notes (Signed)
  Care Coordination   Note   08/03/2022 Name: Tonya [name] MRN: [id number] DOB: 27-Apr-1941  Tonya [name] is a 81 y.o. year old female who sees [name], MD for primary care. I reached out to [name] by phone today to offer care coordination services.  Tonya [name] was given information about Care Coordination services today including:   The Care Coordination services include support from the care team which includes your Nurse Coordinator, Clinical Social Worker, or Pharmacist.  The Care Coordination team is here to help remove barriers to the health concerns and goals most important to you. Care Coordination services are voluntary, and the patient may decline or stop services at any time by request to their care team member.   Care Coordination Consent Status: Patient agreed to services and verbal consent obtained.   Follow up plan:  Telephone appointment with care coordination team member scheduled for:  08/12/2022  Encounter Outcome:  Pt. Scheduled  Julian [place], [place] Direct Dial: [phone]

## 2022-08-04 DIAGNOSIS — D649 Anemia, unspecified: Secondary | ICD-10-CM | POA: Diagnosis not present

## 2022-08-04 DIAGNOSIS — N390 Urinary tract infection, site not specified: Secondary | ICD-10-CM | POA: Diagnosis not present

## 2022-08-04 DIAGNOSIS — D508 Other iron deficiency anemias: Secondary | ICD-10-CM | POA: Diagnosis not present

## 2022-08-04 DIAGNOSIS — I10 Essential (primary) hypertension: Secondary | ICD-10-CM | POA: Diagnosis not present

## 2022-08-04 DIAGNOSIS — E118 Type 2 diabetes mellitus with unspecified complications: Secondary | ICD-10-CM | POA: Diagnosis not present

## 2022-08-04 DIAGNOSIS — S52032D Displaced fracture of olecranon process with intraarticular extension of left ulna, subsequent encounter for closed fracture with routine healing: Secondary | ICD-10-CM | POA: Diagnosis not present

## 2022-08-04 DIAGNOSIS — S52022D Displaced fracture of olecranon process without intraarticular extension of left ulna, subsequent encounter for closed fracture with routine healing: Secondary | ICD-10-CM | POA: Diagnosis not present

## 2022-08-04 DIAGNOSIS — E785 Hyperlipidemia, unspecified: Secondary | ICD-10-CM | POA: Diagnosis not present

## 2022-08-04 DIAGNOSIS — R296 Repeated falls: Secondary | ICD-10-CM | POA: Diagnosis not present

## 2022-08-04 DIAGNOSIS — Z9181 History of falling: Secondary | ICD-10-CM | POA: Diagnosis not present

## 2022-08-04 DIAGNOSIS — M6281 Muscle weakness (generalized): Secondary | ICD-10-CM | POA: Diagnosis not present

## 2022-08-05 DIAGNOSIS — S42452A Displaced fracture of lateral condyle of left humerus, initial encounter for closed fracture: Secondary | ICD-10-CM | POA: Diagnosis not present

## 2022-08-05 DIAGNOSIS — M6281 Muscle weakness (generalized): Secondary | ICD-10-CM | POA: Diagnosis not present

## 2022-08-05 DIAGNOSIS — S5322XA Traumatic rupture of left radial collateral ligament, initial encounter: Secondary | ICD-10-CM | POA: Diagnosis not present

## 2022-08-05 DIAGNOSIS — E118 Type 2 diabetes mellitus with unspecified complications: Secondary | ICD-10-CM | POA: Diagnosis not present

## 2022-08-05 DIAGNOSIS — D508 Other iron deficiency anemias: Secondary | ICD-10-CM | POA: Diagnosis not present

## 2022-08-05 DIAGNOSIS — M9742XA Periprosthetic fracture around internal prosthetic left elbow joint, initial encounter: Secondary | ICD-10-CM | POA: Diagnosis not present

## 2022-08-05 DIAGNOSIS — R296 Repeated falls: Secondary | ICD-10-CM | POA: Diagnosis not present

## 2022-08-05 DIAGNOSIS — I10 Essential (primary) hypertension: Secondary | ICD-10-CM | POA: Diagnosis not present

## 2022-08-06 ENCOUNTER — Other Ambulatory Visit: Payer: Self-pay | Admitting: *Deleted

## 2022-08-06 DIAGNOSIS — Z79899 Other long term (current) drug therapy: Secondary | ICD-10-CM | POA: Diagnosis not present

## 2022-08-06 NOTE — Patient Outreach (Signed)
Per [facility] Ms. Estelle resides in [facility].  Following for potential [facility] care coordination services as benefit of insurance plan and PCP.   Secure communication sent to SNF social worker to inquire about transition plans.   Will continue to follow while Ms. Frandsen resides in [place].    Marthenia Rolling, MSN, RN,BSN [place] Acute Care Coordinator [phone] (Direct dial)

## 2022-08-07 DIAGNOSIS — M9742XA Periprosthetic fracture around internal prosthetic left elbow joint, initial encounter: Secondary | ICD-10-CM | POA: Diagnosis not present

## 2022-08-07 DIAGNOSIS — I10 Essential (primary) hypertension: Secondary | ICD-10-CM | POA: Diagnosis not present

## 2022-08-07 DIAGNOSIS — R296 Repeated falls: Secondary | ICD-10-CM | POA: Diagnosis not present

## 2022-08-07 DIAGNOSIS — D508 Other iron deficiency anemias: Secondary | ICD-10-CM | POA: Diagnosis not present

## 2022-08-07 DIAGNOSIS — E118 Type 2 diabetes mellitus with unspecified complications: Secondary | ICD-10-CM | POA: Diagnosis not present

## 2022-08-07 DIAGNOSIS — M6281 Muscle weakness (generalized): Secondary | ICD-10-CM | POA: Diagnosis not present

## 2022-08-10 DIAGNOSIS — R296 Repeated falls: Secondary | ICD-10-CM | POA: Diagnosis not present

## 2022-08-10 DIAGNOSIS — E118 Type 2 diabetes mellitus with unspecified complications: Secondary | ICD-10-CM | POA: Diagnosis not present

## 2022-08-10 DIAGNOSIS — I10 Essential (primary) hypertension: Secondary | ICD-10-CM | POA: Diagnosis not present

## 2022-08-10 DIAGNOSIS — D508 Other iron deficiency anemias: Secondary | ICD-10-CM | POA: Diagnosis not present

## 2022-08-10 DIAGNOSIS — M9742XA Periprosthetic fracture around internal prosthetic left elbow joint, initial encounter: Secondary | ICD-10-CM | POA: Diagnosis not present

## 2022-08-10 DIAGNOSIS — M6281 Muscle weakness (generalized): Secondary | ICD-10-CM | POA: Diagnosis not present

## 2022-08-11 DIAGNOSIS — Z9181 History of falling: Secondary | ICD-10-CM | POA: Diagnosis not present

## 2022-08-11 DIAGNOSIS — D649 Anemia, unspecified: Secondary | ICD-10-CM | POA: Diagnosis not present

## 2022-08-11 DIAGNOSIS — E785 Hyperlipidemia, unspecified: Secondary | ICD-10-CM | POA: Diagnosis not present

## 2022-08-11 DIAGNOSIS — E118 Type 2 diabetes mellitus with unspecified complications: Secondary | ICD-10-CM | POA: Diagnosis not present

## 2022-08-11 DIAGNOSIS — Z79899 Other long term (current) drug therapy: Secondary | ICD-10-CM | POA: Diagnosis not present

## 2022-08-11 DIAGNOSIS — I10 Essential (primary) hypertension: Secondary | ICD-10-CM | POA: Diagnosis not present

## 2022-08-11 DIAGNOSIS — S52022D Displaced fracture of olecranon process without intraarticular extension of left ulna, subsequent encounter for closed fracture with routine healing: Secondary | ICD-10-CM | POA: Diagnosis not present

## 2022-08-12 ENCOUNTER — Ambulatory Visit: Payer: Self-pay | Admitting: *Deleted

## 2022-08-12 NOTE — Patient Outreach (Addendum)
  Care Coordination   Initial Visit Note   08/12/2022 Name: Tonya [name] MRN: [id number] DOB: [birth year]  Tonya [name] is a 81 y.o. year old female who sees [name], MD for primary care. I spoke with  Tonya [name] 's daughter Tonya [name] by phone today.  What matters to the patients health and wellness today?  Long term care needs    Goals Addressed             This Visit's Progress    Long term care needs       Care Coordination Interventions: Phone call to patient's daughter to assess for community resource needs and case management involvement Patient's daughter confirms that patient is currently at [facility] and the plan is to remain there for long term care if bed is available Patient's daughter has applied for special assistance medicaid-patient is now medicaid pending No additional need identified at this time, this social worker's contact information provided for any additional concerns/needs that may arise in the future           SDOH assessments and interventions completed:  Yes  SDOH Interventions Today    Flowsheet Row Most Recent Value  SDOH Interventions   Food Insecurity Interventions Intervention Not Indicated  Housing Interventions Intervention Not Indicated  Transportation Interventions Intervention Not Indicated        Care Coordination Interventions Activated:  Yes  Care Coordination Interventions:  Yes, provided   Follow up plan: No further intervention required.   Encounter Outcome:  Pt. Visit Completed

## 2022-08-12 NOTE — Patient Instructions (Signed)
Visit Information  Thank you for taking time to visit with me today. Please don't hesitate to contact me if I can be of assistance to you.   Following are the goals we discussed today:   Goals Addressed             This Visit's Progress    Long term care needs       Care Coordination Interventions: Phone call to patient's daughter to assess for community resource needs and case management involvement Patient's daughter confirms that patient is currently at [facility] and the plan is to remain there for long term care Patient's daughter has applied for special assistance medicaid-patient is now medicaid pending No additional need identified at this time, this social worker's contact information provided for any additional concerns/needs that may arise in the future            Please call the care guide team at [phone] if you need to cancel or reschedule your appointment.   If you are experiencing a Mental Health or [place] or need someone to talk to, please call 911   Patient verbalizes understanding of instructions and care plan provided today and agrees to view in [place]. Active MyChart status and patient understanding of how to access instructions and care plan via MyChart confirmed with patient.     No further follow up required: Patient is currently at [facility] with plans to remain for long term care if bed is available  Elliot Gurney, [place] Worker  [facility] Care Management [phone]

## 2022-08-14 ENCOUNTER — Other Ambulatory Visit: Payer: Self-pay | Admitting: *Deleted

## 2022-08-14 DIAGNOSIS — I10 Essential (primary) hypertension: Secondary | ICD-10-CM | POA: Diagnosis not present

## 2022-08-14 DIAGNOSIS — E785 Hyperlipidemia, unspecified: Secondary | ICD-10-CM | POA: Diagnosis not present

## 2022-08-14 DIAGNOSIS — S52022D Displaced fracture of olecranon process without intraarticular extension of left ulna, subsequent encounter for closed fracture with routine healing: Secondary | ICD-10-CM | POA: Diagnosis not present

## 2022-08-14 DIAGNOSIS — D72829 Elevated white blood cell count, unspecified: Secondary | ICD-10-CM | POA: Diagnosis not present

## 2022-08-14 DIAGNOSIS — D649 Anemia, unspecified: Secondary | ICD-10-CM | POA: Diagnosis not present

## 2022-08-14 DIAGNOSIS — R296 Repeated falls: Secondary | ICD-10-CM | POA: Diagnosis not present

## 2022-08-14 DIAGNOSIS — M9742XA Periprosthetic fracture around internal prosthetic left elbow joint, initial encounter: Secondary | ICD-10-CM | POA: Diagnosis not present

## 2022-08-14 DIAGNOSIS — E118 Type 2 diabetes mellitus with unspecified complications: Secondary | ICD-10-CM | POA: Diagnosis not present

## 2022-08-14 DIAGNOSIS — D508 Other iron deficiency anemias: Secondary | ICD-10-CM | POA: Diagnosis not present

## 2022-08-14 DIAGNOSIS — Z9181 History of falling: Secondary | ICD-10-CM | POA: Diagnosis not present

## 2022-08-14 DIAGNOSIS — M6281 Muscle weakness (generalized): Secondary | ICD-10-CM | POA: Diagnosis not present

## 2022-08-14 NOTE — Patient Outreach (Signed)
[place] Coordinator follow up. Ms. Maulding resides in [facility]. Screening for potential [facility] care coordination services as benefit of insurance plan and PCP.  Update received from [place], SNF social worker. Family has applied for Medicaid. Transition plan is to remain LTC at [facility].   No identifiable [facility] care coordination needs.    Marthenia Rolling, MSN, RN,BSN [place] Acute Care Coordinator [phone] (Direct dial)

## 2022-08-15 DIAGNOSIS — R0989 Other specified symptoms and signs involving the circulatory and respiratory systems: Secondary | ICD-10-CM | POA: Diagnosis not present

## 2022-08-15 DIAGNOSIS — N390 Urinary tract infection, site not specified: Secondary | ICD-10-CM | POA: Diagnosis not present

## 2022-08-16 DIAGNOSIS — N390 Urinary tract infection, site not specified: Secondary | ICD-10-CM | POA: Diagnosis not present

## 2022-08-17 DIAGNOSIS — D72829 Elevated white blood cell count, unspecified: Secondary | ICD-10-CM | POA: Diagnosis not present

## 2022-08-18 ENCOUNTER — Ambulatory Visit (INDEPENDENT_AMBULATORY_CARE_PROVIDER_SITE_OTHER): Payer: Medicare Other

## 2022-08-18 VITALS — Ht 65.0 in | Wt 158.0 lb

## 2022-08-18 DIAGNOSIS — Z0000 Encounter for general adult medical examination without abnormal findings: Secondary | ICD-10-CM | POA: Diagnosis not present

## 2022-08-18 NOTE — Patient Instructions (Signed)
Tonya [name] , Thank you for taking time to come for your Medicare Wellness Visit. I appreciate your ongoing commitment to your health goals. Please review the following plan we discussed and let me know if I can assist you in the future.   Screening recommendations/referrals: Colonoscopy: 07/19/18 Mammogram: 02/02/22 Bone Density: 02/02/22 Recommended yearly ophthalmology/optometry visit for glaucoma screening and checkup Recommended yearly dental visit for hygiene and checkup  Vaccinations: Influenza vaccine: 06/26/22 Pneumococcal vaccine: 10/26/14 Tdap vaccine: 07/12/09, due if have injury Shingles vaccine: n/d   Covid-19:01/25/20, 02/22/20, 09/08/20, 02/14/21  Advanced directives: yes  Conditions/risks identified: none  Next appointment: Follow up in one year for your annual wellness visit    Preventive Care 39 Years and Older, Female Preventive care refers to lifestyle choices and visits with your health care provider that can promote health and wellness. What does preventive care include? A yearly physical exam. This is also called an annual well check. Dental exams once or twice a year. Routine eye exams. Ask your health care provider how often you should have your eyes checked. Personal lifestyle choices, including: Daily care of your teeth and gums. Regular physical activity. Eating a healthy diet. Avoiding tobacco and drug use. Limiting alcohol use. Practicing safe sex. Taking low-dose aspirin every day. Taking vitamin and mineral supplements as recommended by your health care provider. What happens during an annual well check? The services and screenings done by your health care provider during your annual well check will depend on your age, overall health, lifestyle risk factors, and family history of disease. Counseling  Your health care provider may ask you questions about your: Alcohol use. Tobacco use. Drug use. Emotional well-being. Home and relationship  well-being. Sexual activity. Eating habits. History of falls. Memory and ability to understand (cognition). Work and work Statistician. Reproductive health. Screening  You may have the following tests or measurements: Height, weight, and BMI. Blood pressure. Lipid and cholesterol levels. These may be checked every 5 years, or more frequently if you are over 52 years old. Skin check. Lung cancer screening. You may have this screening every year starting at age 85 if you have a 30-pack-year history of smoking and currently smoke or have quit within the past 15 years. Fecal occult blood test (FOBT) of the stool. You may have this test every year starting at age 80. Flexible sigmoidoscopy or colonoscopy. You may have a sigmoidoscopy every 5 years or a colonoscopy every 10 years starting at age 55. Hepatitis C blood test. Hepatitis B blood test. Sexually transmitted disease (STD) testing. Diabetes screening. This is done by checking your blood sugar (glucose) after you have not eaten for a while (fasting). You may have this done every 1-3 years. Bone density scan. This is done to screen for osteoporosis. You may have this done starting at age 24. Mammogram. This may be done every 1-2 years. Talk to your health care provider about how often you should have regular mammograms. Talk with your health care provider about your test results, treatment options, and if necessary, the need for more tests. Vaccines  Your health care provider may recommend certain vaccines, such as: Influenza vaccine. This is recommended every year. Tetanus, diphtheria, and acellular pertussis (Tdap, Td) vaccine. You may need a Td booster every 10 years. Zoster vaccine. You may need this after age 60. Pneumococcal 13-valent conjugate (PCV13) vaccine. One dose is recommended after age 63. Pneumococcal polysaccharide (PPSV23) vaccine. One dose is recommended after age 37. Talk to your health care  provider about which  screenings and vaccines you need and how often you need them. This information is not intended to replace advice given to you by your health care provider. Make sure you discuss any questions you have with your health care provider. Document Released: 10/11/2015 Document Revised: 06/03/2016 Document Reviewed: 07/16/2015 Elsevier Interactive Patient Education  2017 [employer] Prevention in the Home Falls can cause injuries. They can happen to people of all ages. There are many things you can do to make your home safe and to help prevent falls. What can I do on the outside of my home? Regularly fix the edges of walkways and driveways and fix any cracks. Remove anything that might make you trip as you walk through a door, such as a raised step or threshold. Trim any bushes or trees on the path to your home. Use bright outdoor lighting. Clear any walking paths of anything that might make someone trip, such as rocks or tools. Regularly check to see if handrails are loose or broken. Make sure that both sides of any steps have handrails. Any raised decks and porches should have guardrails on the edges. Have any leaves, snow, or ice cleared regularly. Use sand or salt on walking paths during winter. Clean up any spills in your garage right away. This includes oil or grease spills. What can I do in the bathroom? Use night lights. Install grab bars by the toilet and in the tub and shower. Do not use towel bars as grab bars. Use non-skid mats or decals in the tub or shower. If you need to sit down in the shower, use a plastic, non-slip stool. Keep the floor dry. Clean up any water that spills on the floor as soon as it happens. Remove soap buildup in the tub or shower regularly. Attach bath mats securely with double-sided non-slip rug tape. Do not have throw rugs and other things on the floor that can make you trip. What can I do in the bedroom? Use night lights. Make sure that you have a  light by your bed that is easy to reach. Do not use any sheets or blankets that are too big for your bed. They should not hang down onto the floor. Have a firm chair that has side arms. You can use this for support while you get dressed. Do not have throw rugs and other things on the floor that can make you trip. What can I do in the kitchen? Clean up any spills right away. Avoid walking on wet floors. Keep items that you use a lot in easy-to-reach places. If you need to reach something above you, use a strong step stool that has a grab bar. Keep electrical cords out of the way. Do not use floor polish or wax that makes floors slippery. If you must use wax, use non-skid floor wax. Do not have throw rugs and other things on the floor that can make you trip. What can I do with my stairs? Do not leave any items on the stairs. Make sure that there are handrails on both sides of the stairs and use them. Fix handrails that are broken or loose. Make sure that handrails are as long as the stairways. Check any carpeting to make sure that it is firmly attached to the stairs. Fix any carpet that is loose or worn. Avoid having throw rugs at the top or bottom of the stairs. If you do have throw rugs, attach them to the  floor with carpet tape. Make sure that you have a light switch at the top of the stairs and the bottom of the stairs. If you do not have them, ask someone to add them for you. What else can I do to help prevent falls? Wear shoes that: Do not have high heels. Have rubber bottoms. Are comfortable and fit you well. Are closed at the toe. Do not wear sandals. If you use a stepladder: Make sure that it is fully opened. Do not climb a closed stepladder. Make sure that both sides of the stepladder are locked into place. Ask someone to hold it for you, if possible. Clearly mark and make sure that you can see: Any grab bars or handrails. First and last steps. Where the edge of each step  is. Use tools that help you move around (mobility aids) if they are needed. These include: Canes. Walkers. Scooters. Crutches. Turn on the lights when you go into a dark area. Replace any light bulbs as soon as they burn out. Set up your furniture so you have a clear path. Avoid moving your furniture around. If any of your floors are uneven, fix them. If there are any pets around you, be aware of where they are. Review your medicines with your doctor. Some medicines can make you feel dizzy. This can increase your chance of falling. Ask your doctor what other things that you can do to help prevent falls. This information is not intended to replace advice given to you by your health care provider. Make sure you discuss any questions you have with your health care provider. Document Released: 07/11/2009 Document Revised: 02/20/2016 Document Reviewed: 10/19/2014 Elsevier Interactive Patient Education  2017 [employer].

## 2022-08-18 NOTE — Progress Notes (Signed)
Virtual Visit via Telephone Note  I connected with  Tonya [name] on 08/18/22 at  9:30 AM EST by telephone and verified that I am speaking with the correct person using two identifiers. Also spoke w/ Tonya [name], daughter  Location: Patient: home Provider: [place] Persons participating in the virtual visit: [place]   I discussed the limitations, risks, security and privacy concerns of performing an evaluation and management service by telephone and the availability of in person appointments. The patient expressed understanding and agreed to proceed.  Interactive audio and video telecommunications were attempted between this nurse and patient, however failed, due to patient having technical difficulties OR patient did not have access to video capability.  We continued and completed visit with audio only.  Some vital signs may be absent or patient reported.   Tonya David, LPN  Subjective:   Tonya [name] is a 81 y.o. female who presents for Medicare Annual (Subsequent) preventive examination.  Review of Systems     Cardiac Risk Factors include: advanced age (>37mn, >>32women);diabetes mellitus     Objective:    There were no vitals filed for this visit. There is no height or weight on file to calculate BMI.     08/18/2022    9:37 AM 07/28/2022    2:35 PM 07/24/2022    1:18 PM 12/26/2021    1:00 PM 12/25/2021    5:45 PM 12/23/2021   11:49 AM 08/14/2021    2:52 PM  Advanced Directives  Does Patient Have a Medical Advance Directive? Yes Yes Yes Yes No No No  Type of AParamedicof AMontclair State UniversityLiving will HDiazLiving will HMauckportLiving will Living will;Healthcare Power of Attorney     Does patient want to make changes to medical advance directive? No - Patient declined No - Patient declined  No - Patient declined     Copy of [place] Chart? Yes - validated  most recent copy scanned in chart (See row information) No - copy requested Yes - validated most recent copy scanned in chart (See row information) No - copy requested     Would patient like information on creating a medical advance directive?    No - Patient declined   Yes (MAU/Ambulatory/Procedural Areas - Information given)    Current Medications (verified) Outpatient Encounter Medications as of 08/18/2022  Medication Sig   acetaminophen (TYLENOL) 325 MG tablet Take 2 tablets (650 mg total) by mouth every 6 (six) hours as needed for mild pain or moderate pain.   atorvastatin (LIPITOR) 20 MG tablet Take 1 tablet (20 mg total) by mouth daily.   baclofen (LIORESAL) 10 MG tablet Take 1 tablet (10 mg total) by mouth 2 (two) times daily as needed for muscle spasms.   Blood Glucose Monitoring Suppl (ACCU-CHEK AVIVA PLUS) w/Device KIT Use to check blood once sugar daily.   Calcium Carb-Cholecalciferol (CALCIUM 600 + D PO) Take 1 tablet by mouth daily.   carvedilol (COREG) 12.5 MG tablet TAKE 1 TABLET (12.5MG TOTAL) BY MOUTH TWICE A DAY WITH MEALS   cetirizine (ZYRTEC) 10 MG tablet Take 10 mg by mouth at bedtime.   cholestyramine light (PREVALITE) 4 g packet USE 1 PACKET TWICE DAILY WITH A MEAL, AT LEAST 2 HOURS AWAY FROM OTHER MEDS PATIENT NEEDS OFFICE VISIT (Patient taking differently: Take 4 g by mouth daily.)   citalopram (CELEXA) 40 MG tablet TAKE 1 TABLET BY MOUTH EVERY  DAY   diltiazem (CARDIZEM CD) 180 MG 24 hr capsule TAKE 1 CAPSULE BY MOUTH EVERY DAY (Patient taking differently: Take 180 mg by mouth daily.)   ELIQUIS 2.5 MG TABS tablet TAKE 1 TABLET BY MOUTH TWICE A DAY (Patient taking differently: Take 2.5 mg by mouth 2 (two) times daily.)   ferrous sulfate 325 (65 FE) MG EC tablet Take 325 mg by mouth 2 (two) times daily.   glucose blood (ACCU-CHEK AVIVA PLUS) test strip Use to check blood sugar daily.   isosorbide mononitrate (IMDUR) 30 MG 24 hr tablet TAKE 1 TABLET BY MOUTH EVERY DAY  (Patient taking differently: Take 30 mg by mouth daily.)   losartan (COZAAR) 100 MG tablet TAKE 1 TABLET BY MOUTH EVERY DAY   Magnesium Gluconate (MAGNESIUM 27 PO) Take 500 mg by mouth daily.   omeprazole (PRILOSEC) 20 MG capsule TAKE 1 CAPSULE BY MOUTH EVERY DAY (Patient taking differently: Take 20 mg by mouth daily.)   ondansetron (ZOFRAN-ODT) 4 MG disintegrating tablet Take 1 tablet (4 mg total) by mouth every 8 (eight) hours as needed.   traMADol (ULTRAM) 50 MG tablet Take 1 tablet (50 mg total) by mouth daily as needed for severe pain.   Vitamin D, Ergocalciferol, (DRISDOL) 1.25 MG (50000 UNIT) CAPS capsule TAKE 1 CAPSULE (50,000 UNITS TOTAL) BY MOUTH EVERY 7 (SEVEN) DAYS   No facility-administered encounter medications on file as of 08/18/2022.    Allergies (verified) Amoxicillin-pot clavulanate and Nitrofurantoin   History: Past Medical History:  Diagnosis Date   Adenocarcinoma, colon ([place])    Anemia    iron deficiency   Anxiety    Atrial fibrillation (HCC)    Carpal tunnel syndrome    Cataract    REMOVED BILATERAL   Chronic diarrhea    Colon polyps    Tubular adenoma   Coronary artery disease    Decreased vision    Degenerative disk disease    Dementia (HCC)    Moderate   Depression    Diabetes mellitus, type 2 (HCC)    diet controlled   Dysrhythmia    h/o AFIB   Frequent falls    GERD (gastroesophageal reflux disease)    History of blood transfusion    After surgeries   Hyperlipidemia    Hyperparathyroidism ([place])    Hypertension    IBS (irritable bowel syndrome)    Kidney disorder    Stage 3b chronic kidney disease   Osteoarthritis    Osteoporosis    Pneumonia    PONV (postoperative nausea and vomiting)    S/P coronary artery stent placement 2007   Past Surgical History:  Procedure Laterality Date   CARDIAC CATHETERIZATION  08/2009   LAD stent patent. Insignificant CAD, otherwise EF 60-65%   CARPAL TUNNEL RELEASE     CATARACT EXTRACTION Bilateral  03/2015   CHOLECYSTECTOMY     COLON SURGERY     COLONOSCOPY     DILATION AND CURETTAGE OF UTERUS     HEMATOMA EVACUATION     after knee replacement   KNEE ARTHROSCOPY     bilateral   LUMBAR LAMINECTOMY N/A 11/26/2015   Procedure: LUMBAR DECOMPRESSIVE LAMINECTOMY L3-4 AND L4-5, LEFT L2-3 HEMILAMINECTOMY;  Surgeon: Jessy Oto, MD;  Location: [facility];  Service: Orthopedics;  Laterality: N/A;   ORIF ELBOW FRACTURE Left 03/18/2021   Procedure: OPEN REDUCTION INTERNAL FIXATION (ORIF) ELBOW/OLECRANON FRACTURE;  Surgeon: Renette Butters, MD;  Location: [facility];  Service: Orthopedics;  Laterality: Left;   ORIF ELBOW  FRACTURE Left 07/28/2022   Procedure: OPEN REDUCTION INTERNAL FIXATION (ORIF) ELBOW/OLECRANON FRACTURE with screws AND PIN REMOVAL;  Surgeon: Renette Butters, MD;  Location: [facility];  Service: Orthopedics;  Laterality: Left;  Block   PARTIAL COLECTOMY  11/2008   right, for adenocarcinoma   REPLACEMENT TOTAL KNEE  22/4825   right, complicated by hemarthrosis    REVERSE SHOULDER ARTHROPLASTY Right 05/28/2016   Procedure: REVERSE SHOULDER ARTHROPLASTY;  Surgeon: Meredith Pel, MD;  Location: [facility];  Service: Orthopedics;  Laterality: Right;   TONSILLECTOMY     TOTAL KNEE ARTHROPLASTY Left 04/03/2014   Procedure: TOTAL KNEE ARTHROPLASTY;  Surgeon: Garald Balding, MD;  Location: [facility];  Service: Orthopedics;  Laterality: Left;   TOTAL KNEE REVISION  03/08/2012   Procedure: TOTAL KNEE REVISION;  Surgeon: Garald Balding, MD;  Location: [facility];  Service: Orthopedics;  Laterality: Right;  removal total knee hardware and placement of antibiotic cement spacer and antibiotic beads   TOTAL KNEE REVISION  05/17/2012   Procedure: TOTAL KNEE REVISION;  Surgeon: Garald Balding, MD;  Location: [facility];  Service: Orthopedics;  Laterality: Right;  right total knee revision, removal of antibiotic spacer   UPPER GI ENDOSCOPY     Family History  Problem Relation Age of Onset   Heart disease  Mother        massive MI age 10   Heart attack Mother    Heart disease Father        Massive MI age 30   Prostate cancer Father    Colon polyps Sister    Diabetes Paternal Grandmother    Colon cancer Neg Hx    Esophageal cancer Neg Hx    Pancreatic cancer Neg Hx    Stomach cancer Neg Hx    Liver disease Neg Hx    Breast cancer Neg Hx    Social History   Socioeconomic History   Marital status: Divorced    Spouse name: Not on file   Number of children: 3   Years of education: Not on file   Highest education level: Not on file  Occupational History   Occupation: retired    Fish farm manager: RETIRED  Tobacco Use   Smoking status: Never   Smokeless tobacco: Never  Vaping Use   Vaping Use: Never used  Substance and Sexual Activity   Alcohol use: No    Alcohol/week: 0.0 standard drinks of alcohol   Drug use: No   Sexual activity: Never  Other Topics Concern   Not on file  Social History Narrative   No regular exercise, limited due to knees   Diet- addicted to sweets, some fruit and veggies, water daily.   Social Determinants of Health   Financial Resource Strain: Low Risk  (08/18/2022)   Overall Financial Resource Strain (CARDIA)    Difficulty of Paying Living Expenses: Not hard at all  Food Insecurity: No Food Insecurity (08/18/2022)   Hunger Vital Sign    Worried About Running Out of Food in the Last Year: Never true    Ran Out of Food in the Last Year: Never true  Transportation Needs: No Transportation Needs (08/18/2022)   PRAPARE - Hydrologist (Medical): No    Lack of Transportation (Non-Medical): No  Physical Activity: Inactive (08/18/2022)   Exercise Vital Sign    Days of Exercise per Week: 0 days    Minutes of Exercise per Session: 0 min  Stress: No Stress Concern Present (08/18/2022)  [place] Questionnaire    Feeling of Stress : Only a little  Social Connections: Socially  Isolated (08/18/2022)   Social Connection and Isolation Panel [NHANES]    Frequency of Communication with Friends and Family: Once a week    Frequency of Social Gatherings with Friends and Family: More than three times a week    Attends Religious Services: Never    Marine scientist or Organizations: No    Attends Music therapist: Never    Marital Status: Divorced    Tobacco Counseling Counseling given: Not Answered   Clinical Intake:  Pre-visit preparation completed: Yes  Pain : No/denies pain     Nutritional Risks: None Diabetes: Yes CBG done?: No Did pt. bring in CBG monitor from home?: No  How often do you need to have someone help you when you read instructions, pamphlets, or other written materials from your doctor or pharmacy?: 1 - Never  Diabetic?yes Nutrition Risk Assessment:  Has the patient had any N/V/D within the last 2 months?  Yes  Does the patient have any non-healing wounds?  No  Has the patient had any unintentional weight loss or weight gain?  No   Diabetes:  Is the patient diabetic?  Yes  If diabetic, was a CBG obtained today?  No  Did the patient bring in their glucometer from home?  No  How often do you monitor your CBG's? Every day   Financial Strains and Diabetes Management:  Are you having any financial strains with the device, your supplies or your medication? No .  Does the patient want to be seen by Chronic Care Management for management of their diabetes?  No  Would the patient like to be referred to a Nutritionist or for Diabetic Management?  No   Diabetic Exams:  Diabetic Eye Exam: Completed 09/12/20. Overdue for diabetic eye exam. Pt has been advised about the importance in completing this exam.   Diabetic Foot Exam: Completed 07/21/22 ordered. Pt has been advised about the importance in completing this exam.   Interpreter Needed?: No  Information entered by :: Kirke Shaggy, LPN   Activities of Daily  Living    08/18/2022    9:38 AM 07/28/2022    2:35 PM  In your present state of health, do you have any difficulty performing the following activities:  Hearing? 1 1  Vision? 0 0  Difficulty concentrating or making decisions? 1 1  Walking or climbing stairs? 1 0  Dressing or bathing? 1 0  Doing errands, shopping? 1 0  Preparing Food and eating ? Y   Using the Toilet? N   In the past six months, have you accidently leaked urine? N   Do you have problems with loss of bowel control? N   Managing your Medications? Y   Managing your Finances? Y   Housekeeping or managing your Housekeeping? Y     Patient Care Team: [name], MD as PCP - General Minus Breeding, MD as PCP - Cardiology (Cardiology) [name], [facility] as Pharmacist (Pharmacist)  Indicate any recent Medical Services you may have received from other than [facility] providers in the past year (date may be approximate).     Assessment:   This is a routine wellness examination for Sharayah.  Hearing/Vision screen Hearing Screening - Comments:: No aids Vision Screening - Comments:: Wears eyeglasses- Dr.Groat  Dietary issues and exercise activities discussed: Current Exercise Habits: The patient does not participate  in regular exercise at present   Goals Addressed             This Visit's Progress    DIET - EAT MORE FRUITS AND VEGETABLES         Depression Screen    08/18/2022    9:35 AM 10/17/2021   10:36 AM 09/05/2021   10:50 AM 08/14/2021    2:56 PM 07/11/2020    9:44 AM 07/02/2020   10:34 AM 07/03/2019   12:02 PM  PHQ 2/9 Scores  PHQ - 2 Score 0 0 4 0 0 0 0  PHQ- 9 Score 0 0 13  0 0 0    Fall Risk    08/18/2022    9:38 AM 03/03/2022    9:02 AM 01/29/2022    8:45 AM 08/14/2021    2:54 PM 07/11/2020    9:44 AM  Fall Risk   Falls in the past year? 1 0 1 1 0  Number falls in past yr: 1   0 0  Injury with Fall? 1   1 0  Comment    broke left elbow   Risk for fall due to : History of fall(s);Mental  status change   Other (Comment)   Risk for fall due to: Comment    fell over dog   Follow up Falls evaluation completed;Falls prevention discussed   Falls prevention discussed     FALL RISK PREVENTION PERTAINING TO THE HOME:  Any stairs in or around the home? No  If so, are there any without handrails? No  Home free of loose throw rugs in walkways, pet beds, electrical cords, etc? Yes  Adequate lighting in your home to reduce risk of falls? Yes   ASSISTIVE DEVICES UTILIZED TO PREVENT FALLS:  Life alert? Yes  Use of a cane, walker or w/c? Yes  Grab bars in the bathroom? Yes  Shower chair or bench in shower? Yes  Elevated toilet seat or a handicapped toilet? Yes    Cognitive Function:pt not available     07/02/2020   10:35 AM 07/03/2019   12:04 PM 02/10/2017    8:52 AM  MMSE - Mini Mental State Exam  Orientation to time _0 Orientation to Place _1 Registration _2 Attention/ Calculation 5 5 0  Recall _3 Language- name 2 objects   0  Language- repeat _4 Language- follow 3 step command   3  Language- read & follow direction   0  Write a sentence   0  Copy design   0  Total score   20        Immunizations Immunization History  Administered Date(s) Administered   Fluad Quad(high Dose 65+) 06/26/2022   Influenza Split 06/29/2011, 07/08/2011, 06/17/2013   Influenza Whole 07/01/2007, 06/18/2008, 07/12/2009, 07/17/2010   Influenza, High Dose Seasonal PF 06/25/2014, 06/10/2015, 06/06/2016, 05/26/2017, 05/28/2018, 05/21/2019, 05/16/2020, 06/08/2021   Moderna Sars-Covid-2 Vaccination 01/25/2020, 02/22/2020   PFIZER Comirnaty(Gray Top)Covid-19 Tri-Sucrose Vaccine 02/14/2021   PFIZER(Purple Top)SARS-COV-2 Vaccination 09/08/2020   Pneumococcal Conjugate-13 10/26/2014   Pneumococcal Polysaccharide-23 09/29/2003, 05/20/2012   Td 07/12/2009    TDAP status: Due, Education has been provided regarding the importance of this vaccine. Advised may receive this vaccine  at local pharmacy or Health Dept. Aware to provide a copy of the vaccination record if obtained from local pharmacy or Health Dept. Verbalized acceptance and understanding.  Flu Vaccine status: Up to date  Pneumococcal  vaccine status: Up to date  Covid-19 vaccine status: Completed vaccines  Qualifies for Shingles Vaccine? Yes   Zostavax completed No   Shingrix Completed?: No.    Education has been provided regarding the importance of this vaccine. Patient has been advised to call insurance company to determine out of pocket expense if they have not yet received this vaccine. Advised may also receive vaccine at local pharmacy or Health Dept. Verbalized acceptance and understanding.  Screening Tests Health Maintenance  Topic Date Due   Zoster Vaccines- Shingrix (1 of 2) Never done   COLONOSCOPY (Pts 45-38yr Insurance coverage will need to be confirmed)  07/19/2021   OPHTHALMOLOGY EXAM  09/12/2021   FOOT EXAM  01/09/2022   COVID-19 Vaccine (5 - 2023-24 season) 05/29/2022   Diabetic kidney evaluation - Urine ACR  12/13/2022   HEMOGLOBIN A1C  01/26/2023   MAMMOGRAM  02/03/2023   Diabetic kidney evaluation - GFR measurement  07/29/2023   Medicare Annual Wellness (AWV)  08/19/2023   DEXA SCAN  02/03/2027   Pneumonia Vaccine 81 Years old  Completed   INFLUENZA VACCINE  Completed   HPV VACCINES  Aged Out    Health Maintenance  Health Maintenance Due  Topic Date Due   Zoster Vaccines- Shingrix (1 of 2) Never done   COLONOSCOPY (Pts 45-452yrInsurance coverage will need to be confirmed)  07/19/2021   OPHTHALMOLOGY EXAM  09/12/2021   FOOT EXAM  01/09/2022   COVID-19 Vaccine (5 - 2023-24 season) 05/29/2022    Colorectal cancer screening: No longer required.   Mammogram status: No longer required due to age.  Bone Density status: Completed 02/02/22. Results reflect: Bone density results: NORMAL. Repeat every 5 years.  Lung Cancer Screening: (Low Dose CT Chest recommended if Age  82-80ears, 30 pack-year currently smoking OR have quit w/in 15years.) does not qualify.   Additional Screening:  Hepatitis C Screening: does not qualify; Completed no  Vision Screening: Recommended annual ophthalmology exams for early detection of glaucoma and other disorders of the eye. Is the patient up to date with their annual eye exam?  Yes  Who is the provider or what is the name of the office in which the patient attends annual eye exams? Dr.Groat If pt is not established with a provider, would they like to be referred to a provider to establish care? No .   Dental Screening: Recommended annual dental exams for proper oral hygiene  Community Resource Referral / Chronic Care Management: CRR required this visit?  No   CCM required this visit?  No      Plan:     I have personally reviewed and noted the following in the patient's chart:   Medical and social history Use of alcohol, tobacco or illicit drugs  Current medications and supplements including opioid prescriptions. Patient is not currently taking opioid prescriptions. Functional ability and status Nutritional status Physical activity Advanced directives List of other physicians Hospitalizations, surgeries, and ER visits in previous 12 months Vitals Screenings to include cognitive, depression, and falls Referrals and appointments  In addition, I have reviewed and discussed with patient certain preventive protocols, quality metrics, and best practice recommendations. A written personalized care plan for preventive services as well as general preventive health recommendations were provided to patient.     LoDionisio DavidLPN   [id number] Nurse Notes: in nursing home, no longer living w/ daughter

## 2022-08-24 DIAGNOSIS — R2689 Other abnormalities of gait and mobility: Secondary | ICD-10-CM | POA: Diagnosis not present

## 2022-08-24 DIAGNOSIS — R278 Other lack of coordination: Secondary | ICD-10-CM | POA: Diagnosis not present

## 2022-08-24 DIAGNOSIS — M6281 Muscle weakness (generalized): Secondary | ICD-10-CM | POA: Diagnosis not present

## 2022-08-24 DIAGNOSIS — S52022D Displaced fracture of olecranon process without intraarticular extension of left ulna, subsequent encounter for closed fracture with routine healing: Secondary | ICD-10-CM | POA: Diagnosis not present

## 2022-08-25 DIAGNOSIS — R2689 Other abnormalities of gait and mobility: Secondary | ICD-10-CM | POA: Diagnosis not present

## 2022-08-25 DIAGNOSIS — M6281 Muscle weakness (generalized): Secondary | ICD-10-CM | POA: Diagnosis not present

## 2022-08-25 DIAGNOSIS — E785 Hyperlipidemia, unspecified: Secondary | ICD-10-CM | POA: Diagnosis not present

## 2022-08-25 DIAGNOSIS — D649 Anemia, unspecified: Secondary | ICD-10-CM | POA: Diagnosis not present

## 2022-08-25 DIAGNOSIS — R278 Other lack of coordination: Secondary | ICD-10-CM | POA: Diagnosis not present

## 2022-08-25 DIAGNOSIS — Z9181 History of falling: Secondary | ICD-10-CM | POA: Diagnosis not present

## 2022-08-25 DIAGNOSIS — I10 Essential (primary) hypertension: Secondary | ICD-10-CM | POA: Diagnosis not present

## 2022-08-25 DIAGNOSIS — S52022D Displaced fracture of olecranon process without intraarticular extension of left ulna, subsequent encounter for closed fracture with routine healing: Secondary | ICD-10-CM | POA: Diagnosis not present

## 2022-08-25 DIAGNOSIS — E118 Type 2 diabetes mellitus with unspecified complications: Secondary | ICD-10-CM | POA: Diagnosis not present

## 2022-08-26 DIAGNOSIS — M6281 Muscle weakness (generalized): Secondary | ICD-10-CM | POA: Diagnosis not present

## 2022-08-26 DIAGNOSIS — S52022D Displaced fracture of olecranon process without intraarticular extension of left ulna, subsequent encounter for closed fracture with routine healing: Secondary | ICD-10-CM | POA: Diagnosis not present

## 2022-08-26 DIAGNOSIS — R2689 Other abnormalities of gait and mobility: Secondary | ICD-10-CM | POA: Diagnosis not present

## 2022-08-26 DIAGNOSIS — R278 Other lack of coordination: Secondary | ICD-10-CM | POA: Diagnosis not present

## 2022-08-27 DIAGNOSIS — R2689 Other abnormalities of gait and mobility: Secondary | ICD-10-CM | POA: Diagnosis not present

## 2022-08-27 DIAGNOSIS — M6281 Muscle weakness (generalized): Secondary | ICD-10-CM | POA: Diagnosis not present

## 2022-08-27 DIAGNOSIS — S52022D Displaced fracture of olecranon process without intraarticular extension of left ulna, subsequent encounter for closed fracture with routine healing: Secondary | ICD-10-CM | POA: Diagnosis not present

## 2022-08-27 DIAGNOSIS — R278 Other lack of coordination: Secondary | ICD-10-CM | POA: Diagnosis not present

## 2022-08-27 DIAGNOSIS — M810 Age-related osteoporosis without current pathological fracture: Secondary | ICD-10-CM | POA: Diagnosis not present

## 2022-08-28 DIAGNOSIS — Z9181 History of falling: Secondary | ICD-10-CM | POA: Diagnosis not present

## 2022-08-28 DIAGNOSIS — E785 Hyperlipidemia, unspecified: Secondary | ICD-10-CM | POA: Diagnosis not present

## 2022-08-28 DIAGNOSIS — I10 Essential (primary) hypertension: Secondary | ICD-10-CM | POA: Diagnosis not present

## 2022-08-28 DIAGNOSIS — E118 Type 2 diabetes mellitus with unspecified complications: Secondary | ICD-10-CM | POA: Diagnosis not present

## 2022-08-28 DIAGNOSIS — D508 Other iron deficiency anemias: Secondary | ICD-10-CM | POA: Diagnosis not present

## 2022-08-28 DIAGNOSIS — R4588 Nonsuicidal self-harm: Secondary | ICD-10-CM | POA: Diagnosis not present

## 2022-08-28 DIAGNOSIS — R278 Other lack of coordination: Secondary | ICD-10-CM | POA: Diagnosis not present

## 2022-08-28 DIAGNOSIS — M6281 Muscle weakness (generalized): Secondary | ICD-10-CM | POA: Diagnosis not present

## 2022-08-28 DIAGNOSIS — R296 Repeated falls: Secondary | ICD-10-CM | POA: Diagnosis not present

## 2022-08-28 DIAGNOSIS — S52022D Displaced fracture of olecranon process without intraarticular extension of left ulna, subsequent encounter for closed fracture with routine healing: Secondary | ICD-10-CM | POA: Diagnosis not present

## 2022-08-28 DIAGNOSIS — M9742XA Periprosthetic fracture around internal prosthetic left elbow joint, initial encounter: Secondary | ICD-10-CM | POA: Diagnosis not present

## 2022-08-28 DIAGNOSIS — R2689 Other abnormalities of gait and mobility: Secondary | ICD-10-CM | POA: Diagnosis not present

## 2022-08-28 DIAGNOSIS — D72829 Elevated white blood cell count, unspecified: Secondary | ICD-10-CM | POA: Diagnosis not present

## 2022-08-28 DIAGNOSIS — D649 Anemia, unspecified: Secondary | ICD-10-CM | POA: Diagnosis not present

## 2022-08-31 DIAGNOSIS — S52022D Displaced fracture of olecranon process without intraarticular extension of left ulna, subsequent encounter for closed fracture with routine healing: Secondary | ICD-10-CM | POA: Diagnosis not present

## 2022-08-31 DIAGNOSIS — R2689 Other abnormalities of gait and mobility: Secondary | ICD-10-CM | POA: Diagnosis not present

## 2022-08-31 DIAGNOSIS — R278 Other lack of coordination: Secondary | ICD-10-CM | POA: Diagnosis not present

## 2022-08-31 DIAGNOSIS — M6281 Muscle weakness (generalized): Secondary | ICD-10-CM | POA: Diagnosis not present

## 2022-09-01 DIAGNOSIS — D649 Anemia, unspecified: Secondary | ICD-10-CM | POA: Diagnosis not present

## 2022-09-01 DIAGNOSIS — M6281 Muscle weakness (generalized): Secondary | ICD-10-CM | POA: Diagnosis not present

## 2022-09-01 DIAGNOSIS — E118 Type 2 diabetes mellitus with unspecified complications: Secondary | ICD-10-CM | POA: Diagnosis not present

## 2022-09-01 DIAGNOSIS — E785 Hyperlipidemia, unspecified: Secondary | ICD-10-CM | POA: Diagnosis not present

## 2022-09-01 DIAGNOSIS — S52022D Displaced fracture of olecranon process without intraarticular extension of left ulna, subsequent encounter for closed fracture with routine healing: Secondary | ICD-10-CM | POA: Diagnosis not present

## 2022-09-01 DIAGNOSIS — R2689 Other abnormalities of gait and mobility: Secondary | ICD-10-CM | POA: Diagnosis not present

## 2022-09-01 DIAGNOSIS — R278 Other lack of coordination: Secondary | ICD-10-CM | POA: Diagnosis not present

## 2022-09-01 DIAGNOSIS — I10 Essential (primary) hypertension: Secondary | ICD-10-CM | POA: Diagnosis not present

## 2022-09-01 DIAGNOSIS — Z9181 History of falling: Secondary | ICD-10-CM | POA: Diagnosis not present

## 2022-09-02 DIAGNOSIS — S52022D Displaced fracture of olecranon process without intraarticular extension of left ulna, subsequent encounter for closed fracture with routine healing: Secondary | ICD-10-CM | POA: Diagnosis not present

## 2022-09-02 DIAGNOSIS — S42452A Displaced fracture of lateral condyle of left humerus, initial encounter for closed fracture: Secondary | ICD-10-CM | POA: Diagnosis not present

## 2022-09-02 DIAGNOSIS — S5322XA Traumatic rupture of left radial collateral ligament, initial encounter: Secondary | ICD-10-CM | POA: Diagnosis not present

## 2022-09-02 DIAGNOSIS — M6281 Muscle weakness (generalized): Secondary | ICD-10-CM | POA: Diagnosis not present

## 2022-09-02 DIAGNOSIS — R278 Other lack of coordination: Secondary | ICD-10-CM | POA: Diagnosis not present

## 2022-09-02 DIAGNOSIS — R2689 Other abnormalities of gait and mobility: Secondary | ICD-10-CM | POA: Diagnosis not present

## 2022-09-03 DIAGNOSIS — M9742XA Periprosthetic fracture around internal prosthetic left elbow joint, initial encounter: Secondary | ICD-10-CM | POA: Diagnosis not present

## 2022-09-03 DIAGNOSIS — R278 Other lack of coordination: Secondary | ICD-10-CM | POA: Diagnosis not present

## 2022-09-03 DIAGNOSIS — R2689 Other abnormalities of gait and mobility: Secondary | ICD-10-CM | POA: Diagnosis not present

## 2022-09-03 DIAGNOSIS — S52022D Displaced fracture of olecranon process without intraarticular extension of left ulna, subsequent encounter for closed fracture with routine healing: Secondary | ICD-10-CM | POA: Diagnosis not present

## 2022-09-03 DIAGNOSIS — M6281 Muscle weakness (generalized): Secondary | ICD-10-CM | POA: Diagnosis not present

## 2022-09-04 DIAGNOSIS — E118 Type 2 diabetes mellitus with unspecified complications: Secondary | ICD-10-CM | POA: Diagnosis not present

## 2022-09-04 DIAGNOSIS — Z9181 History of falling: Secondary | ICD-10-CM | POA: Diagnosis not present

## 2022-09-04 DIAGNOSIS — D649 Anemia, unspecified: Secondary | ICD-10-CM | POA: Diagnosis not present

## 2022-09-04 DIAGNOSIS — S52022D Displaced fracture of olecranon process without intraarticular extension of left ulna, subsequent encounter for closed fracture with routine healing: Secondary | ICD-10-CM | POA: Diagnosis not present

## 2022-09-04 DIAGNOSIS — I10 Essential (primary) hypertension: Secondary | ICD-10-CM | POA: Diagnosis not present

## 2022-09-04 DIAGNOSIS — E785 Hyperlipidemia, unspecified: Secondary | ICD-10-CM | POA: Diagnosis not present

## 2022-09-08 DIAGNOSIS — R296 Repeated falls: Secondary | ICD-10-CM | POA: Diagnosis not present

## 2022-09-08 DIAGNOSIS — E118 Type 2 diabetes mellitus with unspecified complications: Secondary | ICD-10-CM | POA: Diagnosis not present

## 2022-09-08 DIAGNOSIS — R2689 Other abnormalities of gait and mobility: Secondary | ICD-10-CM | POA: Diagnosis not present

## 2022-09-08 DIAGNOSIS — I10 Essential (primary) hypertension: Secondary | ICD-10-CM | POA: Diagnosis not present

## 2022-09-08 DIAGNOSIS — M6281 Muscle weakness (generalized): Secondary | ICD-10-CM | POA: Diagnosis not present

## 2022-09-08 DIAGNOSIS — S52022D Displaced fracture of olecranon process without intraarticular extension of left ulna, subsequent encounter for closed fracture with routine healing: Secondary | ICD-10-CM | POA: Diagnosis not present

## 2022-09-08 DIAGNOSIS — D649 Anemia, unspecified: Secondary | ICD-10-CM | POA: Diagnosis not present

## 2022-09-08 DIAGNOSIS — E785 Hyperlipidemia, unspecified: Secondary | ICD-10-CM | POA: Diagnosis not present

## 2022-09-08 DIAGNOSIS — S52032D Displaced fracture of olecranon process with intraarticular extension of left ulna, subsequent encounter for closed fracture with routine healing: Secondary | ICD-10-CM | POA: Diagnosis not present

## 2022-09-08 DIAGNOSIS — R278 Other lack of coordination: Secondary | ICD-10-CM | POA: Diagnosis not present

## 2022-09-08 DIAGNOSIS — Z9181 History of falling: Secondary | ICD-10-CM | POA: Diagnosis not present

## 2022-09-09 DIAGNOSIS — R278 Other lack of coordination: Secondary | ICD-10-CM | POA: Diagnosis not present

## 2022-09-09 DIAGNOSIS — M6281 Muscle weakness (generalized): Secondary | ICD-10-CM | POA: Diagnosis not present

## 2022-09-09 DIAGNOSIS — R2689 Other abnormalities of gait and mobility: Secondary | ICD-10-CM | POA: Diagnosis not present

## 2022-09-09 DIAGNOSIS — S52022D Displaced fracture of olecranon process without intraarticular extension of left ulna, subsequent encounter for closed fracture with routine healing: Secondary | ICD-10-CM | POA: Diagnosis not present

## 2022-09-10 DIAGNOSIS — R278 Other lack of coordination: Secondary | ICD-10-CM | POA: Diagnosis not present

## 2022-09-10 DIAGNOSIS — M6281 Muscle weakness (generalized): Secondary | ICD-10-CM | POA: Diagnosis not present

## 2022-09-10 DIAGNOSIS — R2689 Other abnormalities of gait and mobility: Secondary | ICD-10-CM | POA: Diagnosis not present

## 2022-09-10 DIAGNOSIS — S52022D Displaced fracture of olecranon process without intraarticular extension of left ulna, subsequent encounter for closed fracture with routine healing: Secondary | ICD-10-CM | POA: Diagnosis not present

## 2022-09-11 DIAGNOSIS — S52022D Displaced fracture of olecranon process without intraarticular extension of left ulna, subsequent encounter for closed fracture with routine healing: Secondary | ICD-10-CM | POA: Diagnosis not present

## 2022-09-11 DIAGNOSIS — M6281 Muscle weakness (generalized): Secondary | ICD-10-CM | POA: Diagnosis not present

## 2022-09-11 DIAGNOSIS — R2689 Other abnormalities of gait and mobility: Secondary | ICD-10-CM | POA: Diagnosis not present

## 2022-09-11 DIAGNOSIS — R278 Other lack of coordination: Secondary | ICD-10-CM | POA: Diagnosis not present

## 2022-09-14 ENCOUNTER — Telehealth: Payer: Self-pay | Admitting: Family Medicine

## 2022-09-14 DIAGNOSIS — E1121 Type 2 diabetes mellitus with diabetic nephropathy: Secondary | ICD-10-CM

## 2022-09-14 NOTE — Telephone Encounter (Signed)
-----   Message from Velna Hatchet, RT sent at 08/31/2022  2:58 PM EST ----- Regarding: Fri 12/22 lab Patient is scheduled for cpx, please order future labs.  Thanks, Anda Kraft

## 2022-09-15 DIAGNOSIS — R2689 Other abnormalities of gait and mobility: Secondary | ICD-10-CM | POA: Diagnosis not present

## 2022-09-15 DIAGNOSIS — M6281 Muscle weakness (generalized): Secondary | ICD-10-CM | POA: Diagnosis not present

## 2022-09-15 DIAGNOSIS — R278 Other lack of coordination: Secondary | ICD-10-CM | POA: Diagnosis not present

## 2022-09-15 DIAGNOSIS — S52022D Displaced fracture of olecranon process without intraarticular extension of left ulna, subsequent encounter for closed fracture with routine healing: Secondary | ICD-10-CM | POA: Diagnosis not present

## 2022-09-16 DIAGNOSIS — R2689 Other abnormalities of gait and mobility: Secondary | ICD-10-CM | POA: Diagnosis not present

## 2022-09-16 DIAGNOSIS — M6281 Muscle weakness (generalized): Secondary | ICD-10-CM | POA: Diagnosis not present

## 2022-09-16 DIAGNOSIS — S52022D Displaced fracture of olecranon process without intraarticular extension of left ulna, subsequent encounter for closed fracture with routine healing: Secondary | ICD-10-CM | POA: Diagnosis not present

## 2022-09-16 DIAGNOSIS — R278 Other lack of coordination: Secondary | ICD-10-CM | POA: Diagnosis not present

## 2022-09-17 DIAGNOSIS — S52022D Displaced fracture of olecranon process without intraarticular extension of left ulna, subsequent encounter for closed fracture with routine healing: Secondary | ICD-10-CM | POA: Diagnosis not present

## 2022-09-17 DIAGNOSIS — M6281 Muscle weakness (generalized): Secondary | ICD-10-CM | POA: Diagnosis not present

## 2022-09-17 DIAGNOSIS — R2689 Other abnormalities of gait and mobility: Secondary | ICD-10-CM | POA: Diagnosis not present

## 2022-09-17 DIAGNOSIS — R278 Other lack of coordination: Secondary | ICD-10-CM | POA: Diagnosis not present

## 2022-09-18 ENCOUNTER — Other Ambulatory Visit: Payer: Medicare Other

## 2022-09-18 DIAGNOSIS — E118 Type 2 diabetes mellitus with unspecified complications: Secondary | ICD-10-CM | POA: Diagnosis not present

## 2022-09-18 DIAGNOSIS — E785 Hyperlipidemia, unspecified: Secondary | ICD-10-CM | POA: Diagnosis not present

## 2022-09-18 DIAGNOSIS — R2689 Other abnormalities of gait and mobility: Secondary | ICD-10-CM | POA: Diagnosis not present

## 2022-09-18 DIAGNOSIS — R278 Other lack of coordination: Secondary | ICD-10-CM | POA: Diagnosis not present

## 2022-09-18 DIAGNOSIS — I10 Essential (primary) hypertension: Secondary | ICD-10-CM | POA: Diagnosis not present

## 2022-09-18 DIAGNOSIS — M6281 Muscle weakness (generalized): Secondary | ICD-10-CM | POA: Diagnosis not present

## 2022-09-18 DIAGNOSIS — Z9181 History of falling: Secondary | ICD-10-CM | POA: Diagnosis not present

## 2022-09-18 DIAGNOSIS — S52022D Displaced fracture of olecranon process without intraarticular extension of left ulna, subsequent encounter for closed fracture with routine healing: Secondary | ICD-10-CM | POA: Diagnosis not present

## 2022-09-18 DIAGNOSIS — D649 Anemia, unspecified: Secondary | ICD-10-CM | POA: Diagnosis not present

## 2022-09-19 DIAGNOSIS — M6281 Muscle weakness (generalized): Secondary | ICD-10-CM | POA: Diagnosis not present

## 2022-09-19 DIAGNOSIS — R2689 Other abnormalities of gait and mobility: Secondary | ICD-10-CM | POA: Diagnosis not present

## 2022-09-19 DIAGNOSIS — R278 Other lack of coordination: Secondary | ICD-10-CM | POA: Diagnosis not present

## 2022-09-19 DIAGNOSIS — S52022D Displaced fracture of olecranon process without intraarticular extension of left ulna, subsequent encounter for closed fracture with routine healing: Secondary | ICD-10-CM | POA: Diagnosis not present

## 2022-09-22 DIAGNOSIS — R278 Other lack of coordination: Secondary | ICD-10-CM | POA: Diagnosis not present

## 2022-09-22 DIAGNOSIS — S52022D Displaced fracture of olecranon process without intraarticular extension of left ulna, subsequent encounter for closed fracture with routine healing: Secondary | ICD-10-CM | POA: Diagnosis not present

## 2022-09-22 DIAGNOSIS — E785 Hyperlipidemia, unspecified: Secondary | ICD-10-CM | POA: Diagnosis not present

## 2022-09-22 DIAGNOSIS — M6281 Muscle weakness (generalized): Secondary | ICD-10-CM | POA: Diagnosis not present

## 2022-09-22 DIAGNOSIS — E118 Type 2 diabetes mellitus with unspecified complications: Secondary | ICD-10-CM | POA: Diagnosis not present

## 2022-09-22 DIAGNOSIS — D649 Anemia, unspecified: Secondary | ICD-10-CM | POA: Diagnosis not present

## 2022-09-22 DIAGNOSIS — I10 Essential (primary) hypertension: Secondary | ICD-10-CM | POA: Diagnosis not present

## 2022-09-22 DIAGNOSIS — R2689 Other abnormalities of gait and mobility: Secondary | ICD-10-CM | POA: Diagnosis not present

## 2022-09-22 DIAGNOSIS — Z9181 History of falling: Secondary | ICD-10-CM | POA: Diagnosis not present

## 2022-09-23 DIAGNOSIS — R278 Other lack of coordination: Secondary | ICD-10-CM | POA: Diagnosis not present

## 2022-09-23 DIAGNOSIS — R2689 Other abnormalities of gait and mobility: Secondary | ICD-10-CM | POA: Diagnosis not present

## 2022-09-23 DIAGNOSIS — R233 Spontaneous ecchymoses: Secondary | ICD-10-CM | POA: Diagnosis not present

## 2022-09-23 DIAGNOSIS — M6281 Muscle weakness (generalized): Secondary | ICD-10-CM | POA: Diagnosis not present

## 2022-09-23 DIAGNOSIS — S52022D Displaced fracture of olecranon process without intraarticular extension of left ulna, subsequent encounter for closed fracture with routine healing: Secondary | ICD-10-CM | POA: Diagnosis not present

## 2022-09-24 DIAGNOSIS — S52022D Displaced fracture of olecranon process without intraarticular extension of left ulna, subsequent encounter for closed fracture with routine healing: Secondary | ICD-10-CM | POA: Diagnosis not present

## 2022-09-24 DIAGNOSIS — R278 Other lack of coordination: Secondary | ICD-10-CM | POA: Diagnosis not present

## 2022-09-24 DIAGNOSIS — M6281 Muscle weakness (generalized): Secondary | ICD-10-CM | POA: Diagnosis not present

## 2022-09-24 DIAGNOSIS — R2689 Other abnormalities of gait and mobility: Secondary | ICD-10-CM | POA: Diagnosis not present

## 2022-09-25 ENCOUNTER — Encounter: Payer: Medicare Other | Admitting: Family Medicine

## 2022-09-25 DIAGNOSIS — M6281 Muscle weakness (generalized): Secondary | ICD-10-CM | POA: Diagnosis not present

## 2022-09-25 DIAGNOSIS — R2689 Other abnormalities of gait and mobility: Secondary | ICD-10-CM | POA: Diagnosis not present

## 2022-09-25 DIAGNOSIS — R278 Other lack of coordination: Secondary | ICD-10-CM | POA: Diagnosis not present

## 2022-09-25 DIAGNOSIS — S52022D Displaced fracture of olecranon process without intraarticular extension of left ulna, subsequent encounter for closed fracture with routine healing: Secondary | ICD-10-CM | POA: Diagnosis not present

## 2022-09-28 DIAGNOSIS — R2689 Other abnormalities of gait and mobility: Secondary | ICD-10-CM | POA: Diagnosis not present

## 2022-09-28 DIAGNOSIS — S52022D Displaced fracture of olecranon process without intraarticular extension of left ulna, subsequent encounter for closed fracture with routine healing: Secondary | ICD-10-CM | POA: Diagnosis not present

## 2022-09-28 DIAGNOSIS — M6281 Muscle weakness (generalized): Secondary | ICD-10-CM | POA: Diagnosis not present

## 2022-09-28 DIAGNOSIS — R278 Other lack of coordination: Secondary | ICD-10-CM | POA: Diagnosis not present

## 2022-09-29 DIAGNOSIS — R278 Other lack of coordination: Secondary | ICD-10-CM | POA: Diagnosis not present

## 2022-09-29 DIAGNOSIS — S52022D Displaced fracture of olecranon process without intraarticular extension of left ulna, subsequent encounter for closed fracture with routine healing: Secondary | ICD-10-CM | POA: Diagnosis not present

## 2022-09-29 DIAGNOSIS — M6281 Muscle weakness (generalized): Secondary | ICD-10-CM | POA: Diagnosis not present

## 2022-09-29 DIAGNOSIS — R2689 Other abnormalities of gait and mobility: Secondary | ICD-10-CM | POA: Diagnosis not present

## 2022-09-30 ENCOUNTER — Encounter: Payer: Medicare Other | Admitting: Family Medicine

## 2022-09-30 DIAGNOSIS — S52022D Displaced fracture of olecranon process without intraarticular extension of left ulna, subsequent encounter for closed fracture with routine healing: Secondary | ICD-10-CM | POA: Diagnosis not present

## 2022-09-30 DIAGNOSIS — M6281 Muscle weakness (generalized): Secondary | ICD-10-CM | POA: Diagnosis not present

## 2022-09-30 DIAGNOSIS — R278 Other lack of coordination: Secondary | ICD-10-CM | POA: Diagnosis not present

## 2022-09-30 DIAGNOSIS — R2689 Other abnormalities of gait and mobility: Secondary | ICD-10-CM | POA: Diagnosis not present

## 2022-10-01 DIAGNOSIS — R2689 Other abnormalities of gait and mobility: Secondary | ICD-10-CM | POA: Diagnosis not present

## 2022-10-01 DIAGNOSIS — M6281 Muscle weakness (generalized): Secondary | ICD-10-CM | POA: Diagnosis not present

## 2022-10-01 DIAGNOSIS — D72829 Elevated white blood cell count, unspecified: Secondary | ICD-10-CM | POA: Diagnosis not present

## 2022-10-01 DIAGNOSIS — D508 Other iron deficiency anemias: Secondary | ICD-10-CM | POA: Diagnosis not present

## 2022-10-01 DIAGNOSIS — E118 Type 2 diabetes mellitus with unspecified complications: Secondary | ICD-10-CM | POA: Diagnosis not present

## 2022-10-01 DIAGNOSIS — S52032D Displaced fracture of olecranon process with intraarticular extension of left ulna, subsequent encounter for closed fracture with routine healing: Secondary | ICD-10-CM | POA: Diagnosis not present

## 2022-10-01 DIAGNOSIS — I10 Essential (primary) hypertension: Secondary | ICD-10-CM | POA: Diagnosis not present

## 2022-10-01 DIAGNOSIS — R278 Other lack of coordination: Secondary | ICD-10-CM | POA: Diagnosis not present

## 2022-10-01 DIAGNOSIS — R4588 Nonsuicidal self-harm: Secondary | ICD-10-CM | POA: Diagnosis not present

## 2022-10-01 DIAGNOSIS — R296 Repeated falls: Secondary | ICD-10-CM | POA: Diagnosis not present

## 2022-10-01 DIAGNOSIS — S52022D Displaced fracture of olecranon process without intraarticular extension of left ulna, subsequent encounter for closed fracture with routine healing: Secondary | ICD-10-CM | POA: Diagnosis not present

## 2022-10-02 DIAGNOSIS — E785 Hyperlipidemia, unspecified: Secondary | ICD-10-CM | POA: Diagnosis not present

## 2022-10-02 DIAGNOSIS — D649 Anemia, unspecified: Secondary | ICD-10-CM | POA: Diagnosis not present

## 2022-10-02 DIAGNOSIS — Z9181 History of falling: Secondary | ICD-10-CM | POA: Diagnosis not present

## 2022-10-02 DIAGNOSIS — M6281 Muscle weakness (generalized): Secondary | ICD-10-CM | POA: Diagnosis not present

## 2022-10-02 DIAGNOSIS — R2689 Other abnormalities of gait and mobility: Secondary | ICD-10-CM | POA: Diagnosis not present

## 2022-10-02 DIAGNOSIS — S52022D Displaced fracture of olecranon process without intraarticular extension of left ulna, subsequent encounter for closed fracture with routine healing: Secondary | ICD-10-CM | POA: Diagnosis not present

## 2022-10-02 DIAGNOSIS — Z79899 Other long term (current) drug therapy: Secondary | ICD-10-CM | POA: Diagnosis not present

## 2022-10-02 DIAGNOSIS — E118 Type 2 diabetes mellitus with unspecified complications: Secondary | ICD-10-CM | POA: Diagnosis not present

## 2022-10-02 DIAGNOSIS — I10 Essential (primary) hypertension: Secondary | ICD-10-CM | POA: Diagnosis not present

## 2022-10-02 DIAGNOSIS — R278 Other lack of coordination: Secondary | ICD-10-CM | POA: Diagnosis not present

## 2022-10-03 DIAGNOSIS — S52022D Displaced fracture of olecranon process without intraarticular extension of left ulna, subsequent encounter for closed fracture with routine healing: Secondary | ICD-10-CM | POA: Diagnosis not present

## 2022-10-03 DIAGNOSIS — R278 Other lack of coordination: Secondary | ICD-10-CM | POA: Diagnosis not present

## 2022-10-03 DIAGNOSIS — M6281 Muscle weakness (generalized): Secondary | ICD-10-CM | POA: Diagnosis not present

## 2022-10-03 DIAGNOSIS — R2689 Other abnormalities of gait and mobility: Secondary | ICD-10-CM | POA: Diagnosis not present

## 2022-10-05 DIAGNOSIS — R278 Other lack of coordination: Secondary | ICD-10-CM | POA: Diagnosis not present

## 2022-10-05 DIAGNOSIS — R2689 Other abnormalities of gait and mobility: Secondary | ICD-10-CM | POA: Diagnosis not present

## 2022-10-05 DIAGNOSIS — S52022D Displaced fracture of olecranon process without intraarticular extension of left ulna, subsequent encounter for closed fracture with routine healing: Secondary | ICD-10-CM | POA: Diagnosis not present

## 2022-10-05 DIAGNOSIS — M6281 Muscle weakness (generalized): Secondary | ICD-10-CM | POA: Diagnosis not present

## 2022-10-06 DIAGNOSIS — M6281 Muscle weakness (generalized): Secondary | ICD-10-CM | POA: Diagnosis not present

## 2022-10-06 DIAGNOSIS — D649 Anemia, unspecified: Secondary | ICD-10-CM | POA: Diagnosis not present

## 2022-10-06 DIAGNOSIS — R2689 Other abnormalities of gait and mobility: Secondary | ICD-10-CM | POA: Diagnosis not present

## 2022-10-06 DIAGNOSIS — Z9181 History of falling: Secondary | ICD-10-CM | POA: Diagnosis not present

## 2022-10-06 DIAGNOSIS — E118 Type 2 diabetes mellitus with unspecified complications: Secondary | ICD-10-CM | POA: Diagnosis not present

## 2022-10-06 DIAGNOSIS — R278 Other lack of coordination: Secondary | ICD-10-CM | POA: Diagnosis not present

## 2022-10-06 DIAGNOSIS — S52022D Displaced fracture of olecranon process without intraarticular extension of left ulna, subsequent encounter for closed fracture with routine healing: Secondary | ICD-10-CM | POA: Diagnosis not present

## 2022-10-06 DIAGNOSIS — E785 Hyperlipidemia, unspecified: Secondary | ICD-10-CM | POA: Diagnosis not present

## 2022-10-06 DIAGNOSIS — I10 Essential (primary) hypertension: Secondary | ICD-10-CM | POA: Diagnosis not present

## 2022-10-07 DIAGNOSIS — M6281 Muscle weakness (generalized): Secondary | ICD-10-CM | POA: Diagnosis not present

## 2022-10-07 DIAGNOSIS — R2689 Other abnormalities of gait and mobility: Secondary | ICD-10-CM | POA: Diagnosis not present

## 2022-10-07 DIAGNOSIS — S52022D Displaced fracture of olecranon process without intraarticular extension of left ulna, subsequent encounter for closed fracture with routine healing: Secondary | ICD-10-CM | POA: Diagnosis not present

## 2022-10-07 DIAGNOSIS — R278 Other lack of coordination: Secondary | ICD-10-CM | POA: Diagnosis not present

## 2022-10-07 DIAGNOSIS — S52032D Displaced fracture of olecranon process with intraarticular extension of left ulna, subsequent encounter for closed fracture with routine healing: Secondary | ICD-10-CM | POA: Diagnosis not present

## 2022-10-08 DIAGNOSIS — S52022D Displaced fracture of olecranon process without intraarticular extension of left ulna, subsequent encounter for closed fracture with routine healing: Secondary | ICD-10-CM | POA: Diagnosis not present

## 2022-10-08 DIAGNOSIS — M6281 Muscle weakness (generalized): Secondary | ICD-10-CM | POA: Diagnosis not present

## 2022-10-08 DIAGNOSIS — R278 Other lack of coordination: Secondary | ICD-10-CM | POA: Diagnosis not present

## 2022-10-08 DIAGNOSIS — R2689 Other abnormalities of gait and mobility: Secondary | ICD-10-CM | POA: Diagnosis not present

## 2022-10-09 DIAGNOSIS — S52022D Displaced fracture of olecranon process without intraarticular extension of left ulna, subsequent encounter for closed fracture with routine healing: Secondary | ICD-10-CM | POA: Diagnosis not present

## 2022-10-09 DIAGNOSIS — R2689 Other abnormalities of gait and mobility: Secondary | ICD-10-CM | POA: Diagnosis not present

## 2022-10-09 DIAGNOSIS — M6281 Muscle weakness (generalized): Secondary | ICD-10-CM | POA: Diagnosis not present

## 2022-10-09 DIAGNOSIS — R278 Other lack of coordination: Secondary | ICD-10-CM | POA: Diagnosis not present

## 2022-10-12 DIAGNOSIS — M6281 Muscle weakness (generalized): Secondary | ICD-10-CM | POA: Diagnosis not present

## 2022-10-12 DIAGNOSIS — R278 Other lack of coordination: Secondary | ICD-10-CM | POA: Diagnosis not present

## 2022-10-12 DIAGNOSIS — S52022D Displaced fracture of olecranon process without intraarticular extension of left ulna, subsequent encounter for closed fracture with routine healing: Secondary | ICD-10-CM | POA: Diagnosis not present

## 2022-10-12 DIAGNOSIS — R2689 Other abnormalities of gait and mobility: Secondary | ICD-10-CM | POA: Diagnosis not present

## 2022-10-13 DIAGNOSIS — S52022D Displaced fracture of olecranon process without intraarticular extension of left ulna, subsequent encounter for closed fracture with routine healing: Secondary | ICD-10-CM | POA: Diagnosis not present

## 2022-10-13 DIAGNOSIS — M6281 Muscle weakness (generalized): Secondary | ICD-10-CM | POA: Diagnosis not present

## 2022-10-13 DIAGNOSIS — R278 Other lack of coordination: Secondary | ICD-10-CM | POA: Diagnosis not present

## 2022-10-13 DIAGNOSIS — E118 Type 2 diabetes mellitus with unspecified complications: Secondary | ICD-10-CM | POA: Diagnosis not present

## 2022-10-13 DIAGNOSIS — K219 Gastro-esophageal reflux disease without esophagitis: Secondary | ICD-10-CM | POA: Diagnosis not present

## 2022-10-13 DIAGNOSIS — I10 Essential (primary) hypertension: Secondary | ICD-10-CM | POA: Diagnosis not present

## 2022-10-13 DIAGNOSIS — E7849 Other hyperlipidemia: Secondary | ICD-10-CM | POA: Diagnosis not present

## 2022-10-13 DIAGNOSIS — R2689 Other abnormalities of gait and mobility: Secondary | ICD-10-CM | POA: Diagnosis not present

## 2022-10-14 DIAGNOSIS — R278 Other lack of coordination: Secondary | ICD-10-CM | POA: Diagnosis not present

## 2022-10-14 DIAGNOSIS — R2689 Other abnormalities of gait and mobility: Secondary | ICD-10-CM | POA: Diagnosis not present

## 2022-10-14 DIAGNOSIS — S52022D Displaced fracture of olecranon process without intraarticular extension of left ulna, subsequent encounter for closed fracture with routine healing: Secondary | ICD-10-CM | POA: Diagnosis not present

## 2022-10-14 DIAGNOSIS — M6281 Muscle weakness (generalized): Secondary | ICD-10-CM | POA: Diagnosis not present

## 2022-10-15 DIAGNOSIS — S52022D Displaced fracture of olecranon process without intraarticular extension of left ulna, subsequent encounter for closed fracture with routine healing: Secondary | ICD-10-CM | POA: Diagnosis not present

## 2022-10-15 DIAGNOSIS — R2689 Other abnormalities of gait and mobility: Secondary | ICD-10-CM | POA: Diagnosis not present

## 2022-10-15 DIAGNOSIS — M6281 Muscle weakness (generalized): Secondary | ICD-10-CM | POA: Diagnosis not present

## 2022-10-15 DIAGNOSIS — Z79899 Other long term (current) drug therapy: Secondary | ICD-10-CM | POA: Diagnosis not present

## 2022-10-15 DIAGNOSIS — R278 Other lack of coordination: Secondary | ICD-10-CM | POA: Diagnosis not present

## 2022-10-16 DIAGNOSIS — R278 Other lack of coordination: Secondary | ICD-10-CM | POA: Diagnosis not present

## 2022-10-16 DIAGNOSIS — R197 Diarrhea, unspecified: Secondary | ICD-10-CM | POA: Diagnosis not present

## 2022-10-16 DIAGNOSIS — R2689 Other abnormalities of gait and mobility: Secondary | ICD-10-CM | POA: Diagnosis not present

## 2022-10-16 DIAGNOSIS — D72829 Elevated white blood cell count, unspecified: Secondary | ICD-10-CM | POA: Diagnosis not present

## 2022-10-16 DIAGNOSIS — R944 Abnormal results of kidney function studies: Secondary | ICD-10-CM | POA: Diagnosis not present

## 2022-10-16 DIAGNOSIS — Z9181 History of falling: Secondary | ICD-10-CM | POA: Diagnosis not present

## 2022-10-16 DIAGNOSIS — N390 Urinary tract infection, site not specified: Secondary | ICD-10-CM | POA: Diagnosis not present

## 2022-10-16 DIAGNOSIS — M6281 Muscle weakness (generalized): Secondary | ICD-10-CM | POA: Diagnosis not present

## 2022-10-16 DIAGNOSIS — S52022D Displaced fracture of olecranon process without intraarticular extension of left ulna, subsequent encounter for closed fracture with routine healing: Secondary | ICD-10-CM | POA: Diagnosis not present

## 2022-10-16 DIAGNOSIS — E785 Hyperlipidemia, unspecified: Secondary | ICD-10-CM | POA: Diagnosis not present

## 2022-10-16 DIAGNOSIS — R059 Cough, unspecified: Secondary | ICD-10-CM | POA: Diagnosis not present

## 2022-10-16 DIAGNOSIS — E118 Type 2 diabetes mellitus with unspecified complications: Secondary | ICD-10-CM | POA: Diagnosis not present

## 2022-10-16 DIAGNOSIS — I10 Essential (primary) hypertension: Secondary | ICD-10-CM | POA: Diagnosis not present

## 2022-10-16 DIAGNOSIS — D649 Anemia, unspecified: Secondary | ICD-10-CM | POA: Diagnosis not present

## 2022-10-19 DIAGNOSIS — D72829 Elevated white blood cell count, unspecified: Secondary | ICD-10-CM | POA: Diagnosis not present

## 2022-10-19 DIAGNOSIS — R944 Abnormal results of kidney function studies: Secondary | ICD-10-CM | POA: Diagnosis not present

## 2022-10-19 DIAGNOSIS — R197 Diarrhea, unspecified: Secondary | ICD-10-CM | POA: Diagnosis not present

## 2022-10-28 DIAGNOSIS — M810 Age-related osteoporosis without current pathological fracture: Secondary | ICD-10-CM | POA: Diagnosis not present

## 2022-10-29 DIAGNOSIS — D508 Other iron deficiency anemias: Secondary | ICD-10-CM | POA: Diagnosis not present

## 2022-10-29 DIAGNOSIS — M6281 Muscle weakness (generalized): Secondary | ICD-10-CM | POA: Diagnosis not present

## 2022-10-29 DIAGNOSIS — R4588 Nonsuicidal self-harm: Secondary | ICD-10-CM | POA: Diagnosis not present

## 2022-10-29 DIAGNOSIS — I10 Essential (primary) hypertension: Secondary | ICD-10-CM | POA: Diagnosis not present

## 2022-10-29 DIAGNOSIS — E118 Type 2 diabetes mellitus with unspecified complications: Secondary | ICD-10-CM | POA: Diagnosis not present

## 2022-10-29 DIAGNOSIS — S52032D Displaced fracture of olecranon process with intraarticular extension of left ulna, subsequent encounter for closed fracture with routine healing: Secondary | ICD-10-CM | POA: Diagnosis not present

## 2022-10-29 DIAGNOSIS — R296 Repeated falls: Secondary | ICD-10-CM | POA: Diagnosis not present

## 2022-11-04 DIAGNOSIS — S42452A Displaced fracture of lateral condyle of left humerus, initial encounter for closed fracture: Secondary | ICD-10-CM | POA: Diagnosis not present

## 2022-11-05 DIAGNOSIS — S52032D Displaced fracture of olecranon process with intraarticular extension of left ulna, subsequent encounter for closed fracture with routine healing: Secondary | ICD-10-CM | POA: Diagnosis not present

## 2022-11-17 DIAGNOSIS — E118 Type 2 diabetes mellitus with unspecified complications: Secondary | ICD-10-CM | POA: Diagnosis not present

## 2022-11-17 DIAGNOSIS — I10 Essential (primary) hypertension: Secondary | ICD-10-CM | POA: Diagnosis not present

## 2022-11-17 DIAGNOSIS — K219 Gastro-esophageal reflux disease without esophagitis: Secondary | ICD-10-CM | POA: Diagnosis not present

## 2022-11-17 DIAGNOSIS — M6281 Muscle weakness (generalized): Secondary | ICD-10-CM | POA: Diagnosis not present

## 2022-11-19 DIAGNOSIS — R051 Acute cough: Secondary | ICD-10-CM | POA: Diagnosis not present

## 2022-11-25 DIAGNOSIS — M810 Age-related osteoporosis without current pathological fracture: Secondary | ICD-10-CM | POA: Diagnosis not present

## 2022-11-30 DIAGNOSIS — R296 Repeated falls: Secondary | ICD-10-CM | POA: Diagnosis not present

## 2022-11-30 DIAGNOSIS — S52032D Displaced fracture of olecranon process with intraarticular extension of left ulna, subsequent encounter for closed fracture with routine healing: Secondary | ICD-10-CM | POA: Diagnosis not present

## 2022-11-30 DIAGNOSIS — R4588 Nonsuicidal self-harm: Secondary | ICD-10-CM | POA: Diagnosis not present

## 2022-11-30 DIAGNOSIS — I10 Essential (primary) hypertension: Secondary | ICD-10-CM | POA: Diagnosis not present

## 2022-11-30 DIAGNOSIS — M6281 Muscle weakness (generalized): Secondary | ICD-10-CM | POA: Diagnosis not present

## 2022-11-30 DIAGNOSIS — E118 Type 2 diabetes mellitus with unspecified complications: Secondary | ICD-10-CM | POA: Diagnosis not present

## 2022-11-30 DIAGNOSIS — D508 Other iron deficiency anemias: Secondary | ICD-10-CM | POA: Diagnosis not present

## 2022-12-01 DIAGNOSIS — E119 Type 2 diabetes mellitus without complications: Secondary | ICD-10-CM | POA: Diagnosis not present

## 2022-12-01 DIAGNOSIS — E559 Vitamin D deficiency, unspecified: Secondary | ICD-10-CM | POA: Diagnosis not present

## 2022-12-01 DIAGNOSIS — I10 Essential (primary) hypertension: Secondary | ICD-10-CM | POA: Diagnosis not present

## 2022-12-02 DIAGNOSIS — N390 Urinary tract infection, site not specified: Secondary | ICD-10-CM | POA: Diagnosis not present

## 2022-12-04 DIAGNOSIS — B351 Tinea unguium: Secondary | ICD-10-CM | POA: Diagnosis not present

## 2022-12-04 DIAGNOSIS — I739 Peripheral vascular disease, unspecified: Secondary | ICD-10-CM | POA: Diagnosis not present

## 2022-12-09 DIAGNOSIS — N1831 Chronic kidney disease, stage 3a: Secondary | ICD-10-CM | POA: Insufficient documentation

## 2022-12-09 NOTE — Progress Notes (Signed)
Cardiology Office Note   Date:  12/11/2022   ID:  Tonya [name], DOB 24-Nov-1940, MRN [id number]  PCP:  No primary care provider on file.  Cardiologist:   Minus Breeding, MD Referring:  No primary care provider on file.  Chief Complaint  Patient presents with   Atrial Fibrillation       History of Present Illness: Tonya [name] is a 82 y.o. female who presents for evaluation of atrial fibrillation.  Since I last saw her she has had some falls.  She lives in a memory unit and walks with a walker.  They have fall precautions but she still lost her footing.  She has not had any presyncope or syncope.  I was able to review some records from the nursing home and she has good blood pressures.  Heart rates have been in the 70s typically.  She does not feel her atrial fibrillation.  She has had no problems taking anticoagulation and reports no bleeding.  She has had no chest pressure, neck or arm discomfort.  She has had no weight gain or edema.  She had to have surgical repair of her fracture following a fall.   Past Medical History:  Diagnosis Date   Adenocarcinoma, colon ([place])    Anemia    iron deficiency   Anxiety    Atrial fibrillation (HCC)    Carpal tunnel syndrome    Cataract    REMOVED BILATERAL   Chronic diarrhea    Colon polyps    Tubular adenoma   Coronary artery disease    Decreased vision    Degenerative disk disease    Dementia (HCC)    Moderate   Depression    Diabetes mellitus, type 2 (HCC)    diet controlled   Dysrhythmia    h/o AFIB   Frequent falls    GERD (gastroesophageal reflux disease)    History of blood transfusion    After surgeries   Hyperlipidemia    Hyperparathyroidism ([place])    Hypertension    IBS (irritable bowel syndrome)    Kidney disorder    Stage 3b chronic kidney disease   Osteoarthritis    Osteoporosis    Pneumonia    PONV (postoperative nausea and vomiting)    S/P coronary artery stent placement 2007    Past Surgical  History:  Procedure Laterality Date   CARDIAC CATHETERIZATION  08/2009   LAD stent patent. Insignificant CAD, otherwise EF 60-65%   CARPAL TUNNEL RELEASE     CATARACT EXTRACTION Bilateral 03/2015   CHOLECYSTECTOMY     COLON SURGERY     COLONOSCOPY     DILATION AND CURETTAGE OF UTERUS     HEMATOMA EVACUATION     after knee replacement   KNEE ARTHROSCOPY     bilateral   LUMBAR LAMINECTOMY N/A 11/26/2015   Procedure: LUMBAR DECOMPRESSIVE LAMINECTOMY L3-4 AND L4-5, LEFT L2-3 HEMILAMINECTOMY;  Surgeon: Jessy Oto, MD;  Location: [facility];  Service: Orthopedics;  Laterality: N/A;   ORIF ELBOW FRACTURE Left 03/18/2021   Procedure: OPEN REDUCTION INTERNAL FIXATION (ORIF) ELBOW/OLECRANON FRACTURE;  Surgeon: Renette Butters, MD;  Location: [facility];  Service: Orthopedics;  Laterality: Left;   ORIF ELBOW FRACTURE Left 07/28/2022   Procedure: OPEN REDUCTION INTERNAL FIXATION (ORIF) ELBOW/OLECRANON FRACTURE with screws AND PIN REMOVAL;  Surgeon: Renette Butters, MD;  Location: [facility];  Service: Orthopedics;  Laterality: Left;  Block   PARTIAL COLECTOMY  11/2008   right, for adenocarcinoma  REPLACEMENT TOTAL KNEE  99991111   right, complicated by hemarthrosis    REVERSE SHOULDER ARTHROPLASTY Right 05/28/2016   Procedure: REVERSE SHOULDER ARTHROPLASTY;  Surgeon: Meredith Pel, MD;  Location: [facility];  Service: Orthopedics;  Laterality: Right;   TONSILLECTOMY     TOTAL KNEE ARTHROPLASTY Left 04/03/2014   Procedure: TOTAL KNEE ARTHROPLASTY;  Surgeon: Garald Balding, MD;  Location: [facility];  Service: Orthopedics;  Laterality: Left;   TOTAL KNEE REVISION  03/08/2012   Procedure: TOTAL KNEE REVISION;  Surgeon: Garald Balding, MD;  Location: [facility];  Service: Orthopedics;  Laterality: Right;  removal total knee hardware and placement of antibiotic cement spacer and antibiotic beads   TOTAL KNEE REVISION  05/17/2012   Procedure: TOTAL KNEE REVISION;  Surgeon: Garald Balding, MD;  Location: [facility];  Service: Orthopedics;  Laterality: Right;  right total knee revision, removal of antibiotic spacer   UPPER GI ENDOSCOPY       Current Outpatient Medications  Medication Sig Dispense Refill   acetaminophen (TYLENOL) 325 MG tablet Take 2 tablets (650 mg total) by mouth every 6 (six) hours as needed for mild pain or moderate pain. 60 tablet 0   atorvastatin (LIPITOR) 20 MG tablet Take 1 tablet (20 mg total) by mouth daily. 90 tablet 3   baclofen (LIORESAL) 10 MG tablet Take 1 tablet (10 mg total) by mouth 2 (two) times daily as needed for muscle spasms. 14 tablet 0   Blood Glucose Monitoring Suppl (ACCU-CHEK AVIVA PLUS) w/Device KIT Use to check blood once sugar daily. 1 kit 0   Calcium Carb-Cholecalciferol (CALCIUM 600 + [name]) Take 1 tablet by mouth daily.     carvedilol (COREG) 12.5 MG tablet TAKE 1 TABLET (12.5MG  TOTAL) BY MOUTH TWICE A DAY WITH MEALS 180 tablet 3   cetirizine (ZYRTEC) 10 MG tablet Take 10 mg by mouth at bedtime.     cholestyramine light (PREVALITE) 4 g packet USE 1 PACKET TWICE DAILY WITH A MEAL, AT LEAST 2 HOURS AWAY FROM OTHER MEDS PATIENT NEEDS OFFICE VISIT (Patient taking differently: Take 4 g by mouth daily.) 540 packet 3   citalopram (CELEXA) 40 MG tablet TAKE 1 TABLET BY MOUTH EVERY DAY 90 tablet 1   diltiazem (CARDIZEM CD) 180 MG 24 hr capsule TAKE 1 CAPSULE BY MOUTH EVERY DAY (Patient taking differently: Take 180 mg by mouth daily.) 90 capsule 3   ELIQUIS 2.5 MG TABS tablet TAKE 1 TABLET BY MOUTH TWICE A DAY (Patient taking differently: Take 2.5 mg by mouth 2 (two) times daily.) 60 tablet 11   ferrous sulfate 325 (65 FE) MG EC tablet Take 325 mg by mouth 2 (two) times daily.     glucose blood (ACCU-CHEK AVIVA PLUS) test strip Use to check blood sugar daily. 100 each 3   isosorbide mononitrate (IMDUR) 30 MG 24 hr tablet TAKE 1 TABLET BY MOUTH EVERY DAY (Patient taking differently: Take 30 mg by mouth daily.) 90 tablet 2   losartan (COZAAR) 100 MG tablet TAKE 1  TABLET BY MOUTH EVERY DAY 90 tablet 3   Magnesium Gluconate ([name]) Take 500 mg by mouth daily.     omeprazole (PRILOSEC) 20 MG capsule TAKE 1 CAPSULE BY MOUTH EVERY DAY (Patient taking differently: Take 20 mg by mouth daily.) 90 capsule 3   ondansetron (ZOFRAN-ODT) 4 MG disintegrating tablet Take 1 tablet (4 mg total) by mouth every 8 (eight) hours as needed. 20 tablet 0   traMADol (ULTRAM)  50 MG tablet Take 1 tablet (50 mg total) by mouth daily as needed for severe pain. 7 tablet 0   Vitamin D, Ergocalciferol, (DRISDOL) 1.25 MG (50000 UNIT) CAPS capsule TAKE 1 CAPSULE (50,000 UNITS TOTAL) BY MOUTH EVERY 7 (SEVEN) DAYS 12 capsule 0   No current facility-administered medications for this visit.    Allergies:   Amoxicillin-pot clavulanate and Nitrofurantoin    ROS:  Please see the history of present illness.   Otherwise, review of systems are positive for none.   All other systems are reviewed and negative.    PHYSICAL EXAM: VS:  BP 132/80 (BP Location: Right Arm, Patient Position: Sitting, Cuff Size: Normal)   Pulse (!) 116   Ht 5\' 5"  (1.651 m)   Wt 158 lb 9.6 oz (71.9 kg)   SpO2 97%   BMI 26.39 kg/m  , BMI Body mass index is 26.39 kg/m. GENERAL:  Well appearing NECK:  No jugular venous distention, waveform within normal limits, carotid upstroke brisk and symmetric, no bruits, no thyromegaly LUNGS:  Clear to auscultation bilaterally CHEST:  Unremarkable HEART:  PMI not displaced or sustained,S1 and S2 within normal limits, no S3, no clicks, no rubs, no murmurs, irregular  ABD:  Flat, positive bowel sounds normal in frequency in pitch, no bruits, no rebound, no guarding, no midline pulsatile mass, no hepatomegaly, no splenomegaly EXT:  2 plus pulses throughout, no edema, no cyanosis no clubbing  EKG:  EKG is  ordered today. Atrial fibrillation, rate 116, axis within normal limits, intervals within normal limits, no acute ST-T wave changes.  Recent Labs: 12/25/2021: B  Natriuretic Peptide 259.0; TSH 4.414 12/27/2021: Magnesium 2.0 03/17/2022: ALT 30 07/17/2022: Hemoglobin 14.3; Platelets 192 07/28/2022: BUN 27; Creatinine, Ser 1.40; Potassium 4.2; Sodium 139    Lipid Panel    Component Value Date/Time   CHOL 101 03/17/2022 0806   TRIG 136.0 03/17/2022 0806   TRIG 240 (HH) 09/07/2006 0849   HDL 41.90 03/17/2022 0806   CHOLHDL 2 03/17/2022 0806   VLDL 27.2 03/17/2022 0806   LDLCALC 32 03/17/2022 0806   LDLDIRECT 50.0 10/22/2014 0841      Wt Readings from Last 3 Encounters:  12/11/22 158 lb 9.6 oz (71.9 kg)  08/18/22 158 lb (71.7 kg)  07/28/22 166 lb 9.8 oz (75.6 kg)      Other studies Reviewed: Additional studies/ records that were reviewed today include: None Review of the above records demonstrates: NA   ASSESSMENT AND PLAN:  ATRIAL FIB:  Ms. Tonya [name] has a CHA2DS2 - VASc score of 5 .   Her rate is well-controlled by the nursing home records.  She tolerates anticoagulation and I will look for recent labs to include a CBC.  She is on the appropriate dose of anticoagulation and I do not think she is falling enough to preclude stopping therapy.  She would probably not want to consider Watchman.    CAD:  The patient has no new sypmtoms.  No further cardiovascular testing is indicated.  We will continue with aggressive risk reduction and meds as listed.  HTN: The blood pressure is at target.  No change in therapy.  At target.  No change in therapy.   CKD: I will try to get her most recent labs.  The creatinine I have is 1.56 from last year.   The following changes have been made:  None  Labs/ tests ordered today include:  None  Orders Placed This Encounter  Procedures   EKG  12-Lead     Disposition:   FU with me in one year.    Signed, Minus Breeding, MD  12/11/2022 9:47 AM    [place]

## 2022-12-10 DIAGNOSIS — S52032D Displaced fracture of olecranon process with intraarticular extension of left ulna, subsequent encounter for closed fracture with routine healing: Secondary | ICD-10-CM | POA: Diagnosis not present

## 2022-12-11 ENCOUNTER — Ambulatory Visit: Payer: Medicare Other | Attending: Cardiology | Admitting: Cardiology

## 2022-12-11 ENCOUNTER — Encounter: Payer: Self-pay | Admitting: Cardiology

## 2022-12-11 VITALS — BP 132/80 | HR 116 | Ht 65.0 in | Wt 158.6 lb

## 2022-12-11 DIAGNOSIS — I2510 Atherosclerotic heart disease of native coronary artery without angina pectoris: Secondary | ICD-10-CM | POA: Diagnosis not present

## 2022-12-11 DIAGNOSIS — I482 Chronic atrial fibrillation, unspecified: Secondary | ICD-10-CM | POA: Diagnosis not present

## 2022-12-11 DIAGNOSIS — N1831 Chronic kidney disease, stage 3a: Secondary | ICD-10-CM | POA: Diagnosis not present

## 2022-12-11 DIAGNOSIS — I10 Essential (primary) hypertension: Secondary | ICD-10-CM

## 2022-12-11 NOTE — Patient Instructions (Signed)
Medication Instructions:  Your physician recommends that you continue on your current medications as directed. Please refer to the Current Medication list given to you today.  *If you need a refill on your cardiac medications before your next appointment, please call your pharmacy*  Follow-Up: At [facility], you and your health needs are our priority.  As part of our continuing mission to provide you with exceptional heart care, we have created designated Provider Care Teams.  These Care Teams include your primary Cardiologist (physician) and Advanced Practice Providers (APPs -  Physician Assistants and Nurse Practitioners) who all work together to provide you with the care you need, when you need it.  We recommend signing up for the patient portal called "MyChart".  Sign up information is provided on this After Visit Summary.  MyChart is used to connect with patients for Virtual Visits (Telemedicine).  Patients are able to view lab/test results, encounter notes, upcoming appointments, etc.  Non-urgent messages can be sent to your provider as well.   To learn more about what you can do with MyChart, go to [email or web address].    Your next appointment:   12 month(s)  Provider:   James Hochrein, MD     

## 2022-12-16 DIAGNOSIS — N1832 Chronic kidney disease, stage 3b: Secondary | ICD-10-CM | POA: Diagnosis not present

## 2022-12-16 DIAGNOSIS — I10 Essential (primary) hypertension: Secondary | ICD-10-CM | POA: Diagnosis not present

## 2022-12-16 DIAGNOSIS — N2581 Secondary hyperparathyroidism of renal origin: Secondary | ICD-10-CM | POA: Diagnosis not present

## 2022-12-16 DIAGNOSIS — E1122 Type 2 diabetes mellitus with diabetic chronic kidney disease: Secondary | ICD-10-CM | POA: Diagnosis not present

## 2022-12-23 DIAGNOSIS — E782 Mixed hyperlipidemia: Secondary | ICD-10-CM | POA: Diagnosis not present

## 2022-12-24 DIAGNOSIS — R197 Diarrhea, unspecified: Secondary | ICD-10-CM | POA: Diagnosis not present

## 2022-12-24 DIAGNOSIS — R112 Nausea with vomiting, unspecified: Secondary | ICD-10-CM | POA: Diagnosis not present

## 2022-12-28 DIAGNOSIS — F0284 Dementia in other diseases classified elsewhere, unspecified severity, with anxiety: Secondary | ICD-10-CM | POA: Diagnosis not present

## 2022-12-28 DIAGNOSIS — M6281 Muscle weakness (generalized): Secondary | ICD-10-CM | POA: Diagnosis not present

## 2022-12-28 DIAGNOSIS — S52022D Displaced fracture of olecranon process without intraarticular extension of left ulna, subsequent encounter for closed fracture with routine healing: Secondary | ICD-10-CM | POA: Diagnosis not present

## 2022-12-28 DIAGNOSIS — N1832 Chronic kidney disease, stage 3b: Secondary | ICD-10-CM | POA: Diagnosis not present

## 2022-12-28 DIAGNOSIS — M25822 Other specified joint disorders, left elbow: Secondary | ICD-10-CM | POA: Diagnosis not present

## 2022-12-28 DIAGNOSIS — R2689 Other abnormalities of gait and mobility: Secondary | ICD-10-CM | POA: Diagnosis not present

## 2022-12-29 DIAGNOSIS — S52022D Displaced fracture of olecranon process without intraarticular extension of left ulna, subsequent encounter for closed fracture with routine healing: Secondary | ICD-10-CM | POA: Diagnosis not present

## 2022-12-29 DIAGNOSIS — M6281 Muscle weakness (generalized): Secondary | ICD-10-CM | POA: Diagnosis not present

## 2022-12-29 DIAGNOSIS — N1832 Chronic kidney disease, stage 3b: Secondary | ICD-10-CM | POA: Diagnosis not present

## 2022-12-29 DIAGNOSIS — R2689 Other abnormalities of gait and mobility: Secondary | ICD-10-CM | POA: Diagnosis not present

## 2022-12-29 DIAGNOSIS — M25822 Other specified joint disorders, left elbow: Secondary | ICD-10-CM | POA: Diagnosis not present

## 2022-12-29 DIAGNOSIS — F0284 Dementia in other diseases classified elsewhere, unspecified severity, with anxiety: Secondary | ICD-10-CM | POA: Diagnosis not present

## 2022-12-30 DIAGNOSIS — N1832 Chronic kidney disease, stage 3b: Secondary | ICD-10-CM | POA: Diagnosis not present

## 2022-12-30 DIAGNOSIS — S52022D Displaced fracture of olecranon process without intraarticular extension of left ulna, subsequent encounter for closed fracture with routine healing: Secondary | ICD-10-CM | POA: Diagnosis not present

## 2022-12-30 DIAGNOSIS — R2689 Other abnormalities of gait and mobility: Secondary | ICD-10-CM | POA: Diagnosis not present

## 2022-12-30 DIAGNOSIS — M25822 Other specified joint disorders, left elbow: Secondary | ICD-10-CM | POA: Diagnosis not present

## 2022-12-30 DIAGNOSIS — M6281 Muscle weakness (generalized): Secondary | ICD-10-CM | POA: Diagnosis not present

## 2022-12-30 DIAGNOSIS — F0284 Dementia in other diseases classified elsewhere, unspecified severity, with anxiety: Secondary | ICD-10-CM | POA: Diagnosis not present

## 2022-12-31 DIAGNOSIS — R296 Repeated falls: Secondary | ICD-10-CM | POA: Diagnosis not present

## 2022-12-31 DIAGNOSIS — E118 Type 2 diabetes mellitus with unspecified complications: Secondary | ICD-10-CM | POA: Diagnosis not present

## 2022-12-31 DIAGNOSIS — S52032D Displaced fracture of olecranon process with intraarticular extension of left ulna, subsequent encounter for closed fracture with routine healing: Secondary | ICD-10-CM | POA: Diagnosis not present

## 2022-12-31 DIAGNOSIS — I10 Essential (primary) hypertension: Secondary | ICD-10-CM | POA: Diagnosis not present

## 2022-12-31 DIAGNOSIS — M6281 Muscle weakness (generalized): Secondary | ICD-10-CM | POA: Diagnosis not present

## 2022-12-31 DIAGNOSIS — D508 Other iron deficiency anemias: Secondary | ICD-10-CM | POA: Diagnosis not present

## 2022-12-31 DIAGNOSIS — R4588 Nonsuicidal self-harm: Secondary | ICD-10-CM | POA: Diagnosis not present

## 2023-01-01 DIAGNOSIS — N1832 Chronic kidney disease, stage 3b: Secondary | ICD-10-CM | POA: Diagnosis not present

## 2023-01-01 DIAGNOSIS — S52022D Displaced fracture of olecranon process without intraarticular extension of left ulna, subsequent encounter for closed fracture with routine healing: Secondary | ICD-10-CM | POA: Diagnosis not present

## 2023-01-01 DIAGNOSIS — M6281 Muscle weakness (generalized): Secondary | ICD-10-CM | POA: Diagnosis not present

## 2023-01-01 DIAGNOSIS — M25822 Other specified joint disorders, left elbow: Secondary | ICD-10-CM | POA: Diagnosis not present

## 2023-01-01 DIAGNOSIS — F0284 Dementia in other diseases classified elsewhere, unspecified severity, with anxiety: Secondary | ICD-10-CM | POA: Diagnosis not present

## 2023-01-01 DIAGNOSIS — R2689 Other abnormalities of gait and mobility: Secondary | ICD-10-CM | POA: Diagnosis not present

## 2023-01-03 DIAGNOSIS — S52022D Displaced fracture of olecranon process without intraarticular extension of left ulna, subsequent encounter for closed fracture with routine healing: Secondary | ICD-10-CM | POA: Diagnosis not present

## 2023-01-03 DIAGNOSIS — N1832 Chronic kidney disease, stage 3b: Secondary | ICD-10-CM | POA: Diagnosis not present

## 2023-01-03 DIAGNOSIS — R2689 Other abnormalities of gait and mobility: Secondary | ICD-10-CM | POA: Diagnosis not present

## 2023-01-03 DIAGNOSIS — F0284 Dementia in other diseases classified elsewhere, unspecified severity, with anxiety: Secondary | ICD-10-CM | POA: Diagnosis not present

## 2023-01-03 DIAGNOSIS — M25822 Other specified joint disorders, left elbow: Secondary | ICD-10-CM | POA: Diagnosis not present

## 2023-01-03 DIAGNOSIS — M6281 Muscle weakness (generalized): Secondary | ICD-10-CM | POA: Diagnosis not present

## 2023-01-04 DIAGNOSIS — F0284 Dementia in other diseases classified elsewhere, unspecified severity, with anxiety: Secondary | ICD-10-CM | POA: Diagnosis not present

## 2023-01-04 DIAGNOSIS — M6281 Muscle weakness (generalized): Secondary | ICD-10-CM | POA: Diagnosis not present

## 2023-01-04 DIAGNOSIS — S52022D Displaced fracture of olecranon process without intraarticular extension of left ulna, subsequent encounter for closed fracture with routine healing: Secondary | ICD-10-CM | POA: Diagnosis not present

## 2023-01-04 DIAGNOSIS — M25822 Other specified joint disorders, left elbow: Secondary | ICD-10-CM | POA: Diagnosis not present

## 2023-01-04 DIAGNOSIS — N1832 Chronic kidney disease, stage 3b: Secondary | ICD-10-CM | POA: Diagnosis not present

## 2023-01-04 DIAGNOSIS — R2689 Other abnormalities of gait and mobility: Secondary | ICD-10-CM | POA: Diagnosis not present

## 2023-01-05 DIAGNOSIS — M25822 Other specified joint disorders, left elbow: Secondary | ICD-10-CM | POA: Diagnosis not present

## 2023-01-05 DIAGNOSIS — M6281 Muscle weakness (generalized): Secondary | ICD-10-CM | POA: Diagnosis not present

## 2023-01-05 DIAGNOSIS — F0284 Dementia in other diseases classified elsewhere, unspecified severity, with anxiety: Secondary | ICD-10-CM | POA: Diagnosis not present

## 2023-01-05 DIAGNOSIS — R2689 Other abnormalities of gait and mobility: Secondary | ICD-10-CM | POA: Diagnosis not present

## 2023-01-05 DIAGNOSIS — S52022D Displaced fracture of olecranon process without intraarticular extension of left ulna, subsequent encounter for closed fracture with routine healing: Secondary | ICD-10-CM | POA: Diagnosis not present

## 2023-01-05 DIAGNOSIS — N1832 Chronic kidney disease, stage 3b: Secondary | ICD-10-CM | POA: Diagnosis not present

## 2023-01-06 DIAGNOSIS — R2689 Other abnormalities of gait and mobility: Secondary | ICD-10-CM | POA: Diagnosis not present

## 2023-01-06 DIAGNOSIS — N1832 Chronic kidney disease, stage 3b: Secondary | ICD-10-CM | POA: Diagnosis not present

## 2023-01-06 DIAGNOSIS — S52022D Displaced fracture of olecranon process without intraarticular extension of left ulna, subsequent encounter for closed fracture with routine healing: Secondary | ICD-10-CM | POA: Diagnosis not present

## 2023-01-06 DIAGNOSIS — M25822 Other specified joint disorders, left elbow: Secondary | ICD-10-CM | POA: Diagnosis not present

## 2023-01-06 DIAGNOSIS — F0284 Dementia in other diseases classified elsewhere, unspecified severity, with anxiety: Secondary | ICD-10-CM | POA: Diagnosis not present

## 2023-01-06 DIAGNOSIS — M6281 Muscle weakness (generalized): Secondary | ICD-10-CM | POA: Diagnosis not present

## 2023-01-07 DIAGNOSIS — R519 Headache, unspecified: Secondary | ICD-10-CM | POA: Diagnosis not present

## 2023-01-07 DIAGNOSIS — R600 Localized edema: Secondary | ICD-10-CM | POA: Diagnosis not present

## 2023-01-07 DIAGNOSIS — S52032D Displaced fracture of olecranon process with intraarticular extension of left ulna, subsequent encounter for closed fracture with routine healing: Secondary | ICD-10-CM | POA: Diagnosis not present

## 2023-01-08 DIAGNOSIS — F0284 Dementia in other diseases classified elsewhere, unspecified severity, with anxiety: Secondary | ICD-10-CM | POA: Diagnosis not present

## 2023-01-08 DIAGNOSIS — R2689 Other abnormalities of gait and mobility: Secondary | ICD-10-CM | POA: Diagnosis not present

## 2023-01-08 DIAGNOSIS — N1832 Chronic kidney disease, stage 3b: Secondary | ICD-10-CM | POA: Diagnosis not present

## 2023-01-08 DIAGNOSIS — M25822 Other specified joint disorders, left elbow: Secondary | ICD-10-CM | POA: Diagnosis not present

## 2023-01-08 DIAGNOSIS — S52022D Displaced fracture of olecranon process without intraarticular extension of left ulna, subsequent encounter for closed fracture with routine healing: Secondary | ICD-10-CM | POA: Diagnosis not present

## 2023-01-08 DIAGNOSIS — M6281 Muscle weakness (generalized): Secondary | ICD-10-CM | POA: Diagnosis not present

## 2023-01-08 NOTE — Progress Notes (Unsigned)
Cardiology Clinic Note   Date: 01/11/2023 ID: Tonya [name], DOB 1941-06-25, MRN [id number]  Primary Cardiologist:  Rollene Rotunda, MD  Patient Profile    Tonya [name] is a 82 y.o. female who presents to the clinic today for evaluation of lower extremity edema.  Past medical history significant for: CAD. LHC 01/22/2006 (positive stress test): BMS to proximal LAD. LHC 09/17/2009 (positive stress test): Patent stent LAD.  Nonobstructive coronary disease. Nuclear stress test 07/04/2021: Normal, low risk study. Echo 12/26/2021: EF 55 to 60%.  Diastolic function cannot be evaluated.  Mildly elevated PA systolic pressure.  Moderate BAE.  Small pericardial effusion without evidence of tamponade.  Mild MR dilated IVC, RA pressure 15 mmHg. PAF. 24-hour Holter 09/22/2018: A-fib.  Rare PVCs.  Well-controlled ventricular rate for the most part except for mildly increased heart rate in the early morning hours. Hypertension. Hyperlipidemia. Lipid panel 03/17/2022: LDL 32, HDL 42, TG 136, total 101. Saccular aneurysm of proximal cavernous right ICA. T2DM. CKD stage IIIb.   History of Present Illness    Tonya [name] is a longtime patient of cardiology followed by [name] for the above outlined history.  Patient was last seen in the office by [name] on 12/11/2022.  At that time it was felt she was not falling frequently enough to stop anticoagulation (and she is is likely not a candidate for Watchman).  Overall she was doing well and no medication changes were made.  Today, patient is accompanied by her daughter. Patient is pleasantly demented and is limited in participation of HPI. Daughter reports lower extremity edema last week along with a 3 to 4-day history of headache/neck pain.  Daughter reports her aunt visits with patient in the nursing home a few times a week.  She noticed her bilateral feet were swelling and contacted nursing home NP who evaluated patient and gave her Lasix 20  mg x 3 days.  Patient does not weigh daily in the nursing home.  Weight was 156.2 on 4/1 and 158 on 4/8.  Today weight is 154.4.  Daughter went to see patient yesterday and noticed slight puffiness of bilateral ankles at around 5 PM.  Patient sits in a dependent position for much of the day.  Daughter does not think patient was experiencing any shortness of breath.  They walked outside yesterday and daughter noticed slight dyspnea toward the end of the walk.  However, patient is mostly sedentary and has not walked much secondary to the weather being cold.  Patient denies orthopnea or PND.  Discussed the possibility of increased lower extremity edema secondary to patient being more sedentary with decreased p.o. intake secondary to headache last week.  Daughter is in agreement and believes she was drinking even less last week that she normally does.  Her head and neck pain have resolved and they are pending a neurology visit.  No chest pain, tightness, pressure.  Denies palpitations.  No reports of blood in stool or urine.    ROS: All other systems reviewed and are otherwise negative except as noted in History of Present Illness.  Studies Reviewed    ECG is not ordered today.   Risk Assessment/Calculations     CHA2DS2-VASc Score = 6   This indicates a 9.7% annual risk of stroke. The patient's score is based upon: CHF History: 0 HTN History: 1 Diabetes History: 1 Stroke History: 0 Vascular Disease History: 1 Age Score: 2 Gender Score: 1  Physical Exam    VS:  BP 120/64   Pulse 87   Ht  (1.651 m)   Wt 154 lb 6.4 oz (70 kg)   SpO2 96%   BMI 25.69 kg/m  , BMI Body mass index is 25.69 kg/m.  GEN: Well nourished, well developed, in no acute distress. Neck: No JVD or carotid bruits. Cardiac: Irregular rhythm, controlled rate.  No murmurs. No rubs or gallops.   Respiratory:  Respirations regular and unlabored. Clear to auscultation without rales, wheezing or  rhonchi. GI: Soft, nontender, nondistended. Extremities: Radials/DP/PT 2+ and equal bilaterally. No clubbing or cyanosis. No edema   Skin: Warm and dry, no rash. Neuro: Strength intact.  Assessment & Plan   Lower extremity edema.  Patient's daughter reports lower extremity edema noticed by patient's sister last week.  NP was alerted who gave Lasix x 3 days.  Now resolved.  Patient denies orthopnea or PND. Euvolemic and well compensated on exam.  Daughter is in agreement lower extemity edema could be related to headache and neck pain last week causing her to be more sedentary and have decreased po intake. Discussed with daughter providing as needed Lasix versus daily dose.  Given she is euvolemic without lower extremity edema today I think the best course would be to write for as needed Lasix.  Patient's family is close to NP at the nursing home and feel patient can be closely watched.  Will start Lasix 20 mg as needed lower extremity edema.  BMP today. PAF.  24-hour Holter December 2019 showed overall rate well-controlled A-fib. Patient does not complain of palpitations.  No reports of spontaneous bleeding concerns.  She has a regular today with a controlled rate.  Continue carvedilol, diltiazem, Eliquis. Appropriate Eliquis dose. CAD.  S/p BMS to proximal LAD April 2007.  LHC December 2010 showed patent LAD stent.  Nuclear stress test October 2022 was a normal low risk study.  Patient denies chest pain, tightness, pressure.  Continue atorvastatin, carvedilol, isosorbide, Eliquis.  Patient not on aspirin secondary to Eliquis. Hypertension.  BP today 120/64.  BP well-controlled at nursing home.  Patient denies dizziness.  Continue carvedilol, diltiazem, losartan. Hyperlipidemia.  LDL June 2023 32, at goal.  Continue atorvastatin.  Disposition: BMP today. Lasix 20 mg prn lower extremity edema. Return in 1 year as previously planned or sooner as needed.          Signed, Etta Grandchild. Bea Duren, DNP,  NP-C She can be closely watched

## 2023-01-10 DIAGNOSIS — F0284 Dementia in other diseases classified elsewhere, unspecified severity, with anxiety: Secondary | ICD-10-CM | POA: Diagnosis not present

## 2023-01-10 DIAGNOSIS — M25822 Other specified joint disorders, left elbow: Secondary | ICD-10-CM | POA: Diagnosis not present

## 2023-01-10 DIAGNOSIS — S52022D Displaced fracture of olecranon process without intraarticular extension of left ulna, subsequent encounter for closed fracture with routine healing: Secondary | ICD-10-CM | POA: Diagnosis not present

## 2023-01-10 DIAGNOSIS — N1832 Chronic kidney disease, stage 3b: Secondary | ICD-10-CM | POA: Diagnosis not present

## 2023-01-10 DIAGNOSIS — M6281 Muscle weakness (generalized): Secondary | ICD-10-CM | POA: Diagnosis not present

## 2023-01-10 DIAGNOSIS — R2689 Other abnormalities of gait and mobility: Secondary | ICD-10-CM | POA: Diagnosis not present

## 2023-01-11 ENCOUNTER — Encounter: Payer: Self-pay | Admitting: Student

## 2023-01-11 ENCOUNTER — Ambulatory Visit: Payer: Medicare Other | Attending: Student | Admitting: Student

## 2023-01-11 VITALS — BP 120/64 | HR 87 | Ht 65.0 in | Wt 154.4 lb

## 2023-01-11 DIAGNOSIS — S52022D Displaced fracture of olecranon process without intraarticular extension of left ulna, subsequent encounter for closed fracture with routine healing: Secondary | ICD-10-CM | POA: Diagnosis not present

## 2023-01-11 DIAGNOSIS — I152 Hypertension secondary to endocrine disorders: Secondary | ICD-10-CM

## 2023-01-11 DIAGNOSIS — Z79899 Other long term (current) drug therapy: Secondary | ICD-10-CM

## 2023-01-11 DIAGNOSIS — E785 Hyperlipidemia, unspecified: Secondary | ICD-10-CM | POA: Diagnosis not present

## 2023-01-11 DIAGNOSIS — M542 Cervicalgia: Secondary | ICD-10-CM | POA: Diagnosis not present

## 2023-01-11 DIAGNOSIS — E1159 Type 2 diabetes mellitus with other circulatory complications: Secondary | ICD-10-CM | POA: Diagnosis not present

## 2023-01-11 DIAGNOSIS — R2689 Other abnormalities of gait and mobility: Secondary | ICD-10-CM | POA: Diagnosis not present

## 2023-01-11 DIAGNOSIS — I2510 Atherosclerotic heart disease of native coronary artery without angina pectoris: Secondary | ICD-10-CM | POA: Diagnosis not present

## 2023-01-11 DIAGNOSIS — N1832 Chronic kidney disease, stage 3b: Secondary | ICD-10-CM | POA: Diagnosis not present

## 2023-01-11 DIAGNOSIS — I480 Paroxysmal atrial fibrillation: Secondary | ICD-10-CM

## 2023-01-11 DIAGNOSIS — F0284 Dementia in other diseases classified elsewhere, unspecified severity, with anxiety: Secondary | ICD-10-CM | POA: Diagnosis not present

## 2023-01-11 DIAGNOSIS — M6281 Muscle weakness (generalized): Secondary | ICD-10-CM | POA: Diagnosis not present

## 2023-01-11 DIAGNOSIS — R600 Localized edema: Secondary | ICD-10-CM | POA: Diagnosis not present

## 2023-01-11 DIAGNOSIS — M25822 Other specified joint disorders, left elbow: Secondary | ICD-10-CM | POA: Diagnosis not present

## 2023-01-11 MED ORDER — FUROSEMIDE 20 MG PO TABS: 20.0000 mg | ORAL_TABLET | ORAL | 2 refills | Status: DC | PRN

## 2023-01-11 NOTE — Patient Instructions (Addendum)
Medication Instructions:  Your physician has recommended you make the following change in your medication:  START: Lasix 20mg  PRN  *If you need a refill on your cardiac medications before your next appointment, please call your pharmacy*   Lab Work: Your physician recommends that you have the following lab drawn today: BMET  If you have labs (blood work) drawn today and your tests are completely normal, you will receive your results only by: MyChart Message (if you have MyChart) OR A paper copy in the mail If you have any lab test that is abnormal or we need to change your treatment, we will call you to review the results.   Testing/Procedures: NONE   Follow-Up: At [facility], you and your health needs are our priority.  As part of our continuing mission to provide you with exceptional heart care, we have created designated Provider Care Teams.  These Care Teams include your primary Cardiologist (physician) and Advanced Practice Providers (APPs -  Physician Assistants and Nurse Practitioners) who all work together to provide you with the care you need, when you need it.  We recommend signing up for the patient portal called "MyChart".  Sign up information is provided on this After Visit Summary.  MyChart is used to connect with patients for Virtual Visits (Telemedicine).  Patients are able to view lab/test results, encounter notes, upcoming appointments, etc.  Non-urgent messages can be sent to your provider as well.   To learn more about what you can do with MyChart, go to [email or web address].    Your next appointment:   Patient has a recall in so that will be her follow up appointment   Provider:   Rollene Rotunda, MD

## 2023-01-12 DIAGNOSIS — S52022D Displaced fracture of olecranon process without intraarticular extension of left ulna, subsequent encounter for closed fracture with routine healing: Secondary | ICD-10-CM | POA: Diagnosis not present

## 2023-01-12 DIAGNOSIS — M25822 Other specified joint disorders, left elbow: Secondary | ICD-10-CM | POA: Diagnosis not present

## 2023-01-12 DIAGNOSIS — N1832 Chronic kidney disease, stage 3b: Secondary | ICD-10-CM | POA: Diagnosis not present

## 2023-01-12 DIAGNOSIS — R600 Localized edema: Secondary | ICD-10-CM | POA: Diagnosis not present

## 2023-01-12 DIAGNOSIS — M542 Cervicalgia: Secondary | ICD-10-CM | POA: Diagnosis not present

## 2023-01-12 DIAGNOSIS — F0284 Dementia in other diseases classified elsewhere, unspecified severity, with anxiety: Secondary | ICD-10-CM | POA: Diagnosis not present

## 2023-01-12 DIAGNOSIS — R2689 Other abnormalities of gait and mobility: Secondary | ICD-10-CM | POA: Diagnosis not present

## 2023-01-12 DIAGNOSIS — M6281 Muscle weakness (generalized): Secondary | ICD-10-CM | POA: Diagnosis not present

## 2023-01-12 DIAGNOSIS — R519 Headache, unspecified: Secondary | ICD-10-CM | POA: Diagnosis not present

## 2023-01-12 LAB — BASIC METABOLIC PANEL
BUN/Creatinine Ratio: 13 (ref 12–28)
BUN: 19 mg/dL (ref 8–27)
CO2: 22 mmol/L (ref 20–29)
Calcium: 8.8 mg/dL (ref 8.7–10.3)
Chloride: 102 mmol/L (ref 96–106)
Creatinine, Ser: 1.46 mg/dL — ABNORMAL HIGH (ref 0.57–1.00)
Glucose: 196 mg/dL — ABNORMAL HIGH (ref 70–99)
Potassium: 3.6 mmol/L (ref 3.5–5.2)
Sodium: 141 mmol/L (ref 134–144)
eGFR: 36 mL/min/{1.73_m2} — ABNORMAL LOW (ref >59)

## 2023-01-13 DIAGNOSIS — R2689 Other abnormalities of gait and mobility: Secondary | ICD-10-CM | POA: Diagnosis not present

## 2023-01-13 DIAGNOSIS — N1832 Chronic kidney disease, stage 3b: Secondary | ICD-10-CM | POA: Diagnosis not present

## 2023-01-13 DIAGNOSIS — M6281 Muscle weakness (generalized): Secondary | ICD-10-CM | POA: Diagnosis not present

## 2023-01-13 DIAGNOSIS — S52022D Displaced fracture of olecranon process without intraarticular extension of left ulna, subsequent encounter for closed fracture with routine healing: Secondary | ICD-10-CM | POA: Diagnosis not present

## 2023-01-13 DIAGNOSIS — M25822 Other specified joint disorders, left elbow: Secondary | ICD-10-CM | POA: Diagnosis not present

## 2023-01-13 DIAGNOSIS — F0284 Dementia in other diseases classified elsewhere, unspecified severity, with anxiety: Secondary | ICD-10-CM | POA: Diagnosis not present

## 2023-01-14 DIAGNOSIS — M6281 Muscle weakness (generalized): Secondary | ICD-10-CM | POA: Diagnosis not present

## 2023-01-14 DIAGNOSIS — S52022D Displaced fracture of olecranon process without intraarticular extension of left ulna, subsequent encounter for closed fracture with routine healing: Secondary | ICD-10-CM | POA: Diagnosis not present

## 2023-01-14 DIAGNOSIS — M25822 Other specified joint disorders, left elbow: Secondary | ICD-10-CM | POA: Diagnosis not present

## 2023-01-14 DIAGNOSIS — N1832 Chronic kidney disease, stage 3b: Secondary | ICD-10-CM | POA: Diagnosis not present

## 2023-01-14 DIAGNOSIS — R2689 Other abnormalities of gait and mobility: Secondary | ICD-10-CM | POA: Diagnosis not present

## 2023-01-14 DIAGNOSIS — F0284 Dementia in other diseases classified elsewhere, unspecified severity, with anxiety: Secondary | ICD-10-CM | POA: Diagnosis not present

## 2023-01-15 DIAGNOSIS — M25822 Other specified joint disorders, left elbow: Secondary | ICD-10-CM | POA: Diagnosis not present

## 2023-01-15 DIAGNOSIS — S52022D Displaced fracture of olecranon process without intraarticular extension of left ulna, subsequent encounter for closed fracture with routine healing: Secondary | ICD-10-CM | POA: Diagnosis not present

## 2023-01-15 DIAGNOSIS — N1832 Chronic kidney disease, stage 3b: Secondary | ICD-10-CM | POA: Diagnosis not present

## 2023-01-15 DIAGNOSIS — F0284 Dementia in other diseases classified elsewhere, unspecified severity, with anxiety: Secondary | ICD-10-CM | POA: Diagnosis not present

## 2023-01-15 DIAGNOSIS — M6281 Muscle weakness (generalized): Secondary | ICD-10-CM | POA: Diagnosis not present

## 2023-01-15 DIAGNOSIS — R2689 Other abnormalities of gait and mobility: Secondary | ICD-10-CM | POA: Diagnosis not present

## 2023-01-18 DIAGNOSIS — M25822 Other specified joint disorders, left elbow: Secondary | ICD-10-CM | POA: Diagnosis not present

## 2023-01-18 DIAGNOSIS — F0284 Dementia in other diseases classified elsewhere, unspecified severity, with anxiety: Secondary | ICD-10-CM | POA: Diagnosis not present

## 2023-01-18 DIAGNOSIS — R2689 Other abnormalities of gait and mobility: Secondary | ICD-10-CM | POA: Diagnosis not present

## 2023-01-18 DIAGNOSIS — M6281 Muscle weakness (generalized): Secondary | ICD-10-CM | POA: Diagnosis not present

## 2023-01-18 DIAGNOSIS — N1832 Chronic kidney disease, stage 3b: Secondary | ICD-10-CM | POA: Diagnosis not present

## 2023-01-18 DIAGNOSIS — S52022D Displaced fracture of olecranon process without intraarticular extension of left ulna, subsequent encounter for closed fracture with routine healing: Secondary | ICD-10-CM | POA: Diagnosis not present

## 2023-01-19 DIAGNOSIS — F0284 Dementia in other diseases classified elsewhere, unspecified severity, with anxiety: Secondary | ICD-10-CM | POA: Diagnosis not present

## 2023-01-19 DIAGNOSIS — R2689 Other abnormalities of gait and mobility: Secondary | ICD-10-CM | POA: Diagnosis not present

## 2023-01-19 DIAGNOSIS — N1832 Chronic kidney disease, stage 3b: Secondary | ICD-10-CM | POA: Diagnosis not present

## 2023-01-19 DIAGNOSIS — E782 Mixed hyperlipidemia: Secondary | ICD-10-CM | POA: Diagnosis not present

## 2023-01-19 DIAGNOSIS — S52022D Displaced fracture of olecranon process without intraarticular extension of left ulna, subsequent encounter for closed fracture with routine healing: Secondary | ICD-10-CM | POA: Diagnosis not present

## 2023-01-19 DIAGNOSIS — K219 Gastro-esophageal reflux disease without esophagitis: Secondary | ICD-10-CM | POA: Diagnosis not present

## 2023-01-19 DIAGNOSIS — D649 Anemia, unspecified: Secondary | ICD-10-CM | POA: Diagnosis not present

## 2023-01-19 DIAGNOSIS — I10 Essential (primary) hypertension: Secondary | ICD-10-CM | POA: Diagnosis not present

## 2023-01-19 DIAGNOSIS — E118 Type 2 diabetes mellitus with unspecified complications: Secondary | ICD-10-CM | POA: Diagnosis not present

## 2023-01-19 DIAGNOSIS — M6281 Muscle weakness (generalized): Secondary | ICD-10-CM | POA: Diagnosis not present

## 2023-01-19 DIAGNOSIS — M25822 Other specified joint disorders, left elbow: Secondary | ICD-10-CM | POA: Diagnosis not present

## 2023-01-20 DIAGNOSIS — R2689 Other abnormalities of gait and mobility: Secondary | ICD-10-CM | POA: Diagnosis not present

## 2023-01-20 DIAGNOSIS — N1832 Chronic kidney disease, stage 3b: Secondary | ICD-10-CM | POA: Diagnosis not present

## 2023-01-20 DIAGNOSIS — M25822 Other specified joint disorders, left elbow: Secondary | ICD-10-CM | POA: Diagnosis not present

## 2023-01-20 DIAGNOSIS — M6281 Muscle weakness (generalized): Secondary | ICD-10-CM | POA: Diagnosis not present

## 2023-01-20 DIAGNOSIS — S52022D Displaced fracture of olecranon process without intraarticular extension of left ulna, subsequent encounter for closed fracture with routine healing: Secondary | ICD-10-CM | POA: Diagnosis not present

## 2023-01-20 DIAGNOSIS — F0284 Dementia in other diseases classified elsewhere, unspecified severity, with anxiety: Secondary | ICD-10-CM | POA: Diagnosis not present

## 2023-01-21 DIAGNOSIS — E782 Mixed hyperlipidemia: Secondary | ICD-10-CM | POA: Diagnosis not present

## 2023-01-21 DIAGNOSIS — N1832 Chronic kidney disease, stage 3b: Secondary | ICD-10-CM | POA: Diagnosis not present

## 2023-01-21 DIAGNOSIS — M25822 Other specified joint disorders, left elbow: Secondary | ICD-10-CM | POA: Diagnosis not present

## 2023-01-21 DIAGNOSIS — F0284 Dementia in other diseases classified elsewhere, unspecified severity, with anxiety: Secondary | ICD-10-CM | POA: Diagnosis not present

## 2023-01-21 DIAGNOSIS — S52022D Displaced fracture of olecranon process without intraarticular extension of left ulna, subsequent encounter for closed fracture with routine healing: Secondary | ICD-10-CM | POA: Diagnosis not present

## 2023-01-21 DIAGNOSIS — M6281 Muscle weakness (generalized): Secondary | ICD-10-CM | POA: Diagnosis not present

## 2023-01-21 DIAGNOSIS — R2689 Other abnormalities of gait and mobility: Secondary | ICD-10-CM | POA: Diagnosis not present

## 2023-01-22 DIAGNOSIS — F0284 Dementia in other diseases classified elsewhere, unspecified severity, with anxiety: Secondary | ICD-10-CM | POA: Diagnosis not present

## 2023-01-22 DIAGNOSIS — R600 Localized edema: Secondary | ICD-10-CM | POA: Diagnosis not present

## 2023-01-22 DIAGNOSIS — N1832 Chronic kidney disease, stage 3b: Secondary | ICD-10-CM | POA: Diagnosis not present

## 2023-01-22 DIAGNOSIS — M25822 Other specified joint disorders, left elbow: Secondary | ICD-10-CM | POA: Diagnosis not present

## 2023-01-22 DIAGNOSIS — M6281 Muscle weakness (generalized): Secondary | ICD-10-CM | POA: Diagnosis not present

## 2023-01-22 DIAGNOSIS — S52022D Displaced fracture of olecranon process without intraarticular extension of left ulna, subsequent encounter for closed fracture with routine healing: Secondary | ICD-10-CM | POA: Diagnosis not present

## 2023-01-22 DIAGNOSIS — R2689 Other abnormalities of gait and mobility: Secondary | ICD-10-CM | POA: Diagnosis not present

## 2023-01-29 DIAGNOSIS — R600 Localized edema: Secondary | ICD-10-CM | POA: Diagnosis not present

## 2023-01-29 DIAGNOSIS — M6281 Muscle weakness (generalized): Secondary | ICD-10-CM | POA: Diagnosis not present

## 2023-01-29 DIAGNOSIS — E118 Type 2 diabetes mellitus with unspecified complications: Secondary | ICD-10-CM | POA: Diagnosis not present

## 2023-01-29 DIAGNOSIS — R296 Repeated falls: Secondary | ICD-10-CM | POA: Diagnosis not present

## 2023-01-29 DIAGNOSIS — I10 Essential (primary) hypertension: Secondary | ICD-10-CM | POA: Diagnosis not present

## 2023-01-29 DIAGNOSIS — D649 Anemia, unspecified: Secondary | ICD-10-CM | POA: Diagnosis not present

## 2023-02-04 DIAGNOSIS — S52032D Displaced fracture of olecranon process with intraarticular extension of left ulna, subsequent encounter for closed fracture with routine healing: Secondary | ICD-10-CM | POA: Diagnosis not present

## 2023-02-04 DIAGNOSIS — R519 Headache, unspecified: Secondary | ICD-10-CM | POA: Diagnosis not present

## 2023-02-07 DIAGNOSIS — E782 Mixed hyperlipidemia: Secondary | ICD-10-CM | POA: Diagnosis not present

## 2023-03-01 DIAGNOSIS — B351 Tinea unguium: Secondary | ICD-10-CM | POA: Diagnosis not present

## 2023-03-01 DIAGNOSIS — I739 Peripheral vascular disease, unspecified: Secondary | ICD-10-CM | POA: Diagnosis not present

## 2023-03-01 DIAGNOSIS — M2041 Other hammer toe(s) (acquired), right foot: Secondary | ICD-10-CM | POA: Diagnosis not present

## 2023-03-01 DIAGNOSIS — M2042 Other hammer toe(s) (acquired), left foot: Secondary | ICD-10-CM | POA: Diagnosis not present

## 2023-03-02 DIAGNOSIS — E118 Type 2 diabetes mellitus with unspecified complications: Secondary | ICD-10-CM | POA: Diagnosis not present

## 2023-03-02 DIAGNOSIS — M6281 Muscle weakness (generalized): Secondary | ICD-10-CM | POA: Diagnosis not present

## 2023-03-02 DIAGNOSIS — I10 Essential (primary) hypertension: Secondary | ICD-10-CM | POA: Diagnosis not present

## 2023-03-02 DIAGNOSIS — R600 Localized edema: Secondary | ICD-10-CM | POA: Diagnosis not present

## 2023-03-02 DIAGNOSIS — D649 Anemia, unspecified: Secondary | ICD-10-CM | POA: Diagnosis not present

## 2023-03-02 DIAGNOSIS — R296 Repeated falls: Secondary | ICD-10-CM | POA: Diagnosis not present

## 2023-03-04 DIAGNOSIS — I130 Hypertensive heart and chronic kidney disease with heart failure and stage 1 through stage 4 chronic kidney disease, or unspecified chronic kidney disease: Secondary | ICD-10-CM | POA: Diagnosis not present

## 2023-03-04 DIAGNOSIS — E782 Mixed hyperlipidemia: Secondary | ICD-10-CM | POA: Diagnosis not present

## 2023-03-05 DIAGNOSIS — S52032D Displaced fracture of olecranon process with intraarticular extension of left ulna, subsequent encounter for closed fracture with routine healing: Secondary | ICD-10-CM | POA: Diagnosis not present

## 2023-03-07 DIAGNOSIS — N390 Urinary tract infection, site not specified: Secondary | ICD-10-CM | POA: Diagnosis not present

## 2023-03-08 DIAGNOSIS — E119 Type 2 diabetes mellitus without complications: Secondary | ICD-10-CM | POA: Diagnosis not present

## 2023-03-08 DIAGNOSIS — I10 Essential (primary) hypertension: Secondary | ICD-10-CM | POA: Diagnosis not present

## 2023-03-16 DIAGNOSIS — I10 Essential (primary) hypertension: Secondary | ICD-10-CM | POA: Diagnosis not present

## 2023-03-16 DIAGNOSIS — M6281 Muscle weakness (generalized): Secondary | ICD-10-CM | POA: Diagnosis not present

## 2023-03-16 DIAGNOSIS — K219 Gastro-esophageal reflux disease without esophagitis: Secondary | ICD-10-CM | POA: Diagnosis not present

## 2023-03-16 DIAGNOSIS — E118 Type 2 diabetes mellitus with unspecified complications: Secondary | ICD-10-CM | POA: Diagnosis not present

## 2023-03-30 DIAGNOSIS — R296 Repeated falls: Secondary | ICD-10-CM | POA: Diagnosis not present

## 2023-03-30 DIAGNOSIS — M6281 Muscle weakness (generalized): Secondary | ICD-10-CM | POA: Diagnosis not present

## 2023-03-30 DIAGNOSIS — R600 Localized edema: Secondary | ICD-10-CM | POA: Diagnosis not present

## 2023-03-30 DIAGNOSIS — E118 Type 2 diabetes mellitus with unspecified complications: Secondary | ICD-10-CM | POA: Diagnosis not present

## 2023-03-30 DIAGNOSIS — D649 Anemia, unspecified: Secondary | ICD-10-CM | POA: Diagnosis not present

## 2023-03-30 DIAGNOSIS — N1831 Chronic kidney disease, stage 3a: Secondary | ICD-10-CM | POA: Diagnosis not present

## 2023-03-30 DIAGNOSIS — I130 Hypertensive heart and chronic kidney disease with heart failure and stage 1 through stage 4 chronic kidney disease, or unspecified chronic kidney disease: Secondary | ICD-10-CM | POA: Diagnosis not present

## 2023-03-30 DIAGNOSIS — I10 Essential (primary) hypertension: Secondary | ICD-10-CM | POA: Diagnosis not present

## 2023-03-31 DIAGNOSIS — R300 Dysuria: Secondary | ICD-10-CM | POA: Diagnosis not present

## 2023-03-31 DIAGNOSIS — N390 Urinary tract infection, site not specified: Secondary | ICD-10-CM | POA: Diagnosis not present

## 2023-04-02 DIAGNOSIS — R2681 Unsteadiness on feet: Secondary | ICD-10-CM | POA: Diagnosis not present

## 2023-04-02 DIAGNOSIS — F0284 Dementia in other diseases classified elsewhere, unspecified severity, with anxiety: Secondary | ICD-10-CM | POA: Diagnosis not present

## 2023-04-02 DIAGNOSIS — N390 Urinary tract infection, site not specified: Secondary | ICD-10-CM | POA: Diagnosis not present

## 2023-04-02 DIAGNOSIS — N1832 Chronic kidney disease, stage 3b: Secondary | ICD-10-CM | POA: Diagnosis not present

## 2023-04-05 DIAGNOSIS — R2681 Unsteadiness on feet: Secondary | ICD-10-CM | POA: Diagnosis not present

## 2023-04-05 DIAGNOSIS — F0284 Dementia in other diseases classified elsewhere, unspecified severity, with anxiety: Secondary | ICD-10-CM | POA: Diagnosis not present

## 2023-04-05 DIAGNOSIS — N1832 Chronic kidney disease, stage 3b: Secondary | ICD-10-CM | POA: Diagnosis not present

## 2023-04-06 DIAGNOSIS — N1832 Chronic kidney disease, stage 3b: Secondary | ICD-10-CM | POA: Diagnosis not present

## 2023-04-06 DIAGNOSIS — F0284 Dementia in other diseases classified elsewhere, unspecified severity, with anxiety: Secondary | ICD-10-CM | POA: Diagnosis not present

## 2023-04-06 DIAGNOSIS — D649 Anemia, unspecified: Secondary | ICD-10-CM | POA: Diagnosis not present

## 2023-04-06 DIAGNOSIS — R2681 Unsteadiness on feet: Secondary | ICD-10-CM | POA: Diagnosis not present

## 2023-04-06 DIAGNOSIS — S52032D Displaced fracture of olecranon process with intraarticular extension of left ulna, subsequent encounter for closed fracture with routine healing: Secondary | ICD-10-CM | POA: Diagnosis not present

## 2023-04-07 DIAGNOSIS — N1832 Chronic kidney disease, stage 3b: Secondary | ICD-10-CM | POA: Diagnosis not present

## 2023-04-07 DIAGNOSIS — F0284 Dementia in other diseases classified elsewhere, unspecified severity, with anxiety: Secondary | ICD-10-CM | POA: Diagnosis not present

## 2023-04-07 DIAGNOSIS — R2681 Unsteadiness on feet: Secondary | ICD-10-CM | POA: Diagnosis not present

## 2023-04-08 DIAGNOSIS — R300 Dysuria: Secondary | ICD-10-CM | POA: Diagnosis not present

## 2023-04-08 DIAGNOSIS — D72829 Elevated white blood cell count, unspecified: Secondary | ICD-10-CM | POA: Diagnosis not present

## 2023-04-08 DIAGNOSIS — F0284 Dementia in other diseases classified elsewhere, unspecified severity, with anxiety: Secondary | ICD-10-CM | POA: Diagnosis not present

## 2023-04-08 DIAGNOSIS — R2681 Unsteadiness on feet: Secondary | ICD-10-CM | POA: Diagnosis not present

## 2023-04-08 DIAGNOSIS — N1832 Chronic kidney disease, stage 3b: Secondary | ICD-10-CM | POA: Diagnosis not present

## 2023-04-09 DIAGNOSIS — N1832 Chronic kidney disease, stage 3b: Secondary | ICD-10-CM | POA: Diagnosis not present

## 2023-04-09 DIAGNOSIS — F0284 Dementia in other diseases classified elsewhere, unspecified severity, with anxiety: Secondary | ICD-10-CM | POA: Diagnosis not present

## 2023-04-09 DIAGNOSIS — R2681 Unsteadiness on feet: Secondary | ICD-10-CM | POA: Diagnosis not present

## 2023-04-12 DIAGNOSIS — R2681 Unsteadiness on feet: Secondary | ICD-10-CM | POA: Diagnosis not present

## 2023-04-12 DIAGNOSIS — N1832 Chronic kidney disease, stage 3b: Secondary | ICD-10-CM | POA: Diagnosis not present

## 2023-04-12 DIAGNOSIS — F0284 Dementia in other diseases classified elsewhere, unspecified severity, with anxiety: Secondary | ICD-10-CM | POA: Diagnosis not present

## 2023-04-13 DIAGNOSIS — N1832 Chronic kidney disease, stage 3b: Secondary | ICD-10-CM | POA: Diagnosis not present

## 2023-04-13 DIAGNOSIS — R2681 Unsteadiness on feet: Secondary | ICD-10-CM | POA: Diagnosis not present

## 2023-04-13 DIAGNOSIS — F0284 Dementia in other diseases classified elsewhere, unspecified severity, with anxiety: Secondary | ICD-10-CM | POA: Diagnosis not present

## 2023-04-14 DIAGNOSIS — F0284 Dementia in other diseases classified elsewhere, unspecified severity, with anxiety: Secondary | ICD-10-CM | POA: Diagnosis not present

## 2023-04-14 DIAGNOSIS — E118 Type 2 diabetes mellitus with unspecified complications: Secondary | ICD-10-CM | POA: Diagnosis not present

## 2023-04-14 DIAGNOSIS — R2681 Unsteadiness on feet: Secondary | ICD-10-CM | POA: Diagnosis not present

## 2023-04-14 DIAGNOSIS — N1832 Chronic kidney disease, stage 3b: Secondary | ICD-10-CM | POA: Diagnosis not present

## 2023-04-14 DIAGNOSIS — D72829 Elevated white blood cell count, unspecified: Secondary | ICD-10-CM | POA: Diagnosis not present

## 2023-04-14 DIAGNOSIS — I10 Essential (primary) hypertension: Secondary | ICD-10-CM | POA: Diagnosis not present

## 2023-04-16 DIAGNOSIS — N1832 Chronic kidney disease, stage 3b: Secondary | ICD-10-CM | POA: Diagnosis not present

## 2023-04-16 DIAGNOSIS — R2681 Unsteadiness on feet: Secondary | ICD-10-CM | POA: Diagnosis not present

## 2023-04-16 DIAGNOSIS — F0284 Dementia in other diseases classified elsewhere, unspecified severity, with anxiety: Secondary | ICD-10-CM | POA: Diagnosis not present

## 2023-04-19 DIAGNOSIS — N1832 Chronic kidney disease, stage 3b: Secondary | ICD-10-CM | POA: Diagnosis not present

## 2023-04-19 DIAGNOSIS — R2681 Unsteadiness on feet: Secondary | ICD-10-CM | POA: Diagnosis not present

## 2023-04-19 DIAGNOSIS — F0284 Dementia in other diseases classified elsewhere, unspecified severity, with anxiety: Secondary | ICD-10-CM | POA: Diagnosis not present

## 2023-04-20 DIAGNOSIS — F0284 Dementia in other diseases classified elsewhere, unspecified severity, with anxiety: Secondary | ICD-10-CM | POA: Diagnosis not present

## 2023-04-20 DIAGNOSIS — R2681 Unsteadiness on feet: Secondary | ICD-10-CM | POA: Diagnosis not present

## 2023-04-20 DIAGNOSIS — N1832 Chronic kidney disease, stage 3b: Secondary | ICD-10-CM | POA: Diagnosis not present

## 2023-04-21 DIAGNOSIS — F0284 Dementia in other diseases classified elsewhere, unspecified severity, with anxiety: Secondary | ICD-10-CM | POA: Diagnosis not present

## 2023-04-21 DIAGNOSIS — N1832 Chronic kidney disease, stage 3b: Secondary | ICD-10-CM | POA: Diagnosis not present

## 2023-04-21 DIAGNOSIS — R2681 Unsteadiness on feet: Secondary | ICD-10-CM | POA: Diagnosis not present

## 2023-04-22 DIAGNOSIS — R2681 Unsteadiness on feet: Secondary | ICD-10-CM | POA: Diagnosis not present

## 2023-04-22 DIAGNOSIS — N1832 Chronic kidney disease, stage 3b: Secondary | ICD-10-CM | POA: Diagnosis not present

## 2023-04-22 DIAGNOSIS — F0284 Dementia in other diseases classified elsewhere, unspecified severity, with anxiety: Secondary | ICD-10-CM | POA: Diagnosis not present

## 2023-04-23 DIAGNOSIS — N1832 Chronic kidney disease, stage 3b: Secondary | ICD-10-CM | POA: Diagnosis not present

## 2023-04-23 DIAGNOSIS — F0284 Dementia in other diseases classified elsewhere, unspecified severity, with anxiety: Secondary | ICD-10-CM | POA: Diagnosis not present

## 2023-04-23 DIAGNOSIS — R2681 Unsteadiness on feet: Secondary | ICD-10-CM | POA: Diagnosis not present

## 2023-04-26 DIAGNOSIS — F0284 Dementia in other diseases classified elsewhere, unspecified severity, with anxiety: Secondary | ICD-10-CM | POA: Diagnosis not present

## 2023-04-26 DIAGNOSIS — R2681 Unsteadiness on feet: Secondary | ICD-10-CM | POA: Diagnosis not present

## 2023-04-26 DIAGNOSIS — N1832 Chronic kidney disease, stage 3b: Secondary | ICD-10-CM | POA: Diagnosis not present

## 2023-04-27 DIAGNOSIS — N1832 Chronic kidney disease, stage 3b: Secondary | ICD-10-CM | POA: Diagnosis not present

## 2023-04-27 DIAGNOSIS — F0284 Dementia in other diseases classified elsewhere, unspecified severity, with anxiety: Secondary | ICD-10-CM | POA: Diagnosis not present

## 2023-04-27 DIAGNOSIS — R2681 Unsteadiness on feet: Secondary | ICD-10-CM | POA: Diagnosis not present

## 2023-04-28 DIAGNOSIS — R2681 Unsteadiness on feet: Secondary | ICD-10-CM | POA: Diagnosis not present

## 2023-04-28 DIAGNOSIS — F0284 Dementia in other diseases classified elsewhere, unspecified severity, with anxiety: Secondary | ICD-10-CM | POA: Diagnosis not present

## 2023-04-28 DIAGNOSIS — N1832 Chronic kidney disease, stage 3b: Secondary | ICD-10-CM | POA: Diagnosis not present

## 2023-04-29 DIAGNOSIS — R2681 Unsteadiness on feet: Secondary | ICD-10-CM | POA: Diagnosis not present

## 2023-04-29 DIAGNOSIS — N1832 Chronic kidney disease, stage 3b: Secondary | ICD-10-CM | POA: Diagnosis not present

## 2023-04-30 DIAGNOSIS — R2681 Unsteadiness on feet: Secondary | ICD-10-CM | POA: Diagnosis not present

## 2023-04-30 DIAGNOSIS — N1832 Chronic kidney disease, stage 3b: Secondary | ICD-10-CM | POA: Diagnosis not present

## 2023-05-03 DIAGNOSIS — N1832 Chronic kidney disease, stage 3b: Secondary | ICD-10-CM | POA: Diagnosis not present

## 2023-05-03 DIAGNOSIS — R2681 Unsteadiness on feet: Secondary | ICD-10-CM | POA: Diagnosis not present

## 2023-05-04 ENCOUNTER — Encounter: Payer: Self-pay | Admitting: Neurology

## 2023-05-04 ENCOUNTER — Ambulatory Visit (INDEPENDENT_AMBULATORY_CARE_PROVIDER_SITE_OTHER): Payer: Medicare Other | Admitting: Neurology

## 2023-05-04 VITALS — BP 108/66 | HR 79 | Ht 65.0 in | Wt 162.0 lb

## 2023-05-04 DIAGNOSIS — F03C11 Unspecified dementia, severe, with agitation: Secondary | ICD-10-CM | POA: Diagnosis not present

## 2023-05-04 DIAGNOSIS — E118 Type 2 diabetes mellitus with unspecified complications: Secondary | ICD-10-CM | POA: Diagnosis not present

## 2023-05-04 DIAGNOSIS — M6281 Muscle weakness (generalized): Secondary | ICD-10-CM | POA: Diagnosis not present

## 2023-05-04 DIAGNOSIS — I130 Hypertensive heart and chronic kidney disease with heart failure and stage 1 through stage 4 chronic kidney disease, or unspecified chronic kidney disease: Secondary | ICD-10-CM | POA: Diagnosis not present

## 2023-05-04 DIAGNOSIS — D649 Anemia, unspecified: Secondary | ICD-10-CM | POA: Diagnosis not present

## 2023-05-04 DIAGNOSIS — N1832 Chronic kidney disease, stage 3b: Secondary | ICD-10-CM | POA: Diagnosis not present

## 2023-05-04 DIAGNOSIS — R2681 Unsteadiness on feet: Secondary | ICD-10-CM | POA: Diagnosis not present

## 2023-05-04 DIAGNOSIS — I10 Essential (primary) hypertension: Secondary | ICD-10-CM | POA: Diagnosis not present

## 2023-05-04 DIAGNOSIS — N1831 Chronic kidney disease, stage 3a: Secondary | ICD-10-CM | POA: Diagnosis not present

## 2023-05-04 MED ORDER — DIVALPROEX SODIUM 125 MG PO CSDR: 250.0000 mg | DELAYED_RELEASE_CAPSULE | Freq: Three times a day (TID) | ORAL | 11 refills | Status: AC

## 2023-05-04 MED ORDER — OLANZAPINE 2.5 MG PO TABS: 5.0000 mg | ORAL_TABLET | Freq: Every day | ORAL | 0 refills | Status: DC

## 2023-05-04 NOTE — Progress Notes (Signed)
[facility]  PATIENT: Tonya [name] DOB: 1941/08/21  REQUESTING CLINICIAN: Irven Baltimore, NP HISTORY FROM: Daughter  REASON FOR VISIT: Dementia    HISTORICAL  CHIEF COMPLAINT:  Chief Complaint  Patient presents with   New Patient (Initial Visit)    Rm 12, with daughter Raynelle Fanning, NP, increased confusion in the last month, UTI negative, inconsolable in the afternoons, sleep is effected, mood swings, hallucinations    HISTORY OF PRESENT ILLNESS:  This 82 year old woman past medical history of dementia formally diagnosed in 2022 but had symptoms of memory loss for the past 5 years, recently moved into a nursing home as in November, hypertension, hyperlipidemia, diabetes mellitus, atrial fibrillation who is presenting with worsening dementia and some agitation.  Again per daughter patient was diagnosed with dementia 2 years ago and as of November of last year she has been to a nursing home and since then they feel her memory is getting worse.  She is very confused, she has not been sleeping well.  When daughter visit her, the patient tells her that she has not been fed.  She does call her sister multiple times per day crying over the fact that she is has not been eating.  There has been a lot of confusion, daughter feels like patient does not know what to do next, she does not know where to put her clothes, she does not know where things are and this frustrated her and cause her anxiety.   TBI:  No past history of TBI Stroke:   no past history of stroke Seizures:   no past history of seizures Sleep:   no history of sleep apnea.  Mood:   patient denies anxiety and depression Family history of Dementia:   Denies  Functional status: Dependent in all ADLs and IADLs Patient lives with in a nursing home, fully dependent. Forgetting loved ones names?: Yes Word finding difficulty? Yes Sleep: not good, daughter believes she is not sleeping well at night    OTHER  MEDICAL CONDITIONS: Dementia, Atrial fibrillation, Anxiety, Hypertension, Hyperlipidemia, Diabetes Mellitus    REVIEW OF SYSTEMS: Full 14 system review of systems performed and negative with exception of: Unable to fully obtain due to mental status   ALLERGIES: Allergies  Allergen Reactions   Amoxicillin-Pot Clavulanate Swelling and Other (See Comments)    Augmentin per patient, tongue swelling   Nitrofurantoin Diarrhea    Nausea, jaundice, liver enzymes abnormal.    HOME MEDICATIONS: Outpatient Medications Prior to Visit  Medication Sig Dispense Refill   acetaminophen (TYLENOL) 325 MG tablet Take 2 tablets (650 mg total) by mouth every 6 (six) hours as needed for mild pain or moderate pain. 60 tablet 0   atorvastatin (LIPITOR) 20 MG tablet Take 1 tablet (20 mg total) by mouth daily. 90 tablet 3   baclofen (LIORESAL) 10 MG tablet Take 1 tablet (10 mg total) by mouth 2 (two) times daily as needed for muscle spasms. 14 tablet 0   Blood Glucose Monitoring Suppl (ACCU-CHEK AVIVA PLUS) w/Device KIT Use to check blood once sugar daily. 1 kit 0   busPIRone (BUSPAR) 5 MG tablet Take 2.5 mg by mouth 3 (three) times daily.     Calcium Carb-Cholecalciferol (CALCIUM 600 + D PO) Take 1 tablet by mouth daily.     carvedilol (COREG) 12.5 MG tablet TAKE 1 TABLET (12.5MG  TOTAL) BY MOUTH TWICE A DAY WITH MEALS 180 tablet 3   cetirizine (ZYRTEC) 10 MG tablet Take 10 mg by mouth at  bedtime.     cholestyramine light (PREVALITE) 4 g packet USE 1 PACKET TWICE DAILY WITH A MEAL, AT LEAST 2 HOURS AWAY FROM OTHER MEDS PATIENT NEEDS OFFICE VISIT (Patient taking differently: Take 4 g by mouth daily.) 540 packet 3   citalopram (CELEXA) 40 MG tablet TAKE 1 TABLET BY MOUTH EVERY DAY 90 tablet 1   diltiazem (CARDIZEM CD) 180 MG 24 hr capsule TAKE 1 CAPSULE BY MOUTH EVERY DAY (Patient taking differently: Take 180 mg by mouth daily.) 90 capsule 3   ELIQUIS 2.5 MG TABS tablet TAKE 1 TABLET BY MOUTH TWICE A DAY (Patient  taking differently: Take 2.5 mg by mouth 2 (two) times daily.) 60 tablet 11   ferrous sulfate 325 (65 FE) MG EC tablet Take 325 mg by mouth 2 (two) times daily.     furosemide (LASIX) 20 MG tablet Take 1 tablet (20 mg total) by mouth as needed for fluid or edema. 30 tablet 2   glucose blood (ACCU-CHEK AVIVA PLUS) test strip Use to check blood sugar daily. 100 each 3   isosorbide mononitrate (IMDUR) 30 MG 24 hr tablet TAKE 1 TABLET BY MOUTH EVERY DAY (Patient taking differently: Take 30 mg by mouth daily.) 90 tablet 2   losartan (COZAAR) 100 MG tablet TAKE 1 TABLET BY MOUTH EVERY DAY 90 tablet 3   Magnesium Gluconate (MAGNESIUM 27 PO) Take 400 mg by mouth daily.     Menthol, Topical Analgesic, (BIOFREEZE COOL THE PAIN) 4 % GEL Apply 1 Application topically 3 (three) times daily as needed.     omeprazole (PRILOSEC) 20 MG capsule TAKE 1 CAPSULE BY MOUTH EVERY DAY (Patient taking differently: Take 20 mg by mouth daily.) 90 capsule 3   ondansetron (ZOFRAN-ODT) 4 MG disintegrating tablet Take 1 tablet (4 mg total) by mouth every 8 (eight) hours as needed. 20 tablet 0   traMADol (ULTRAM) 50 MG tablet Take 1 tablet (50 mg total) by mouth daily as needed for severe pain. 7 tablet 0   divalproex (DEPAKOTE SPRINKLE) 125 MG capsule Take 125 mg by mouth 3 (three) times daily.     OLANZapine (ZYPREXA) 2.5 MG tablet Take 2.5 mg by mouth at bedtime.     Vitamin D, Ergocalciferol, (DRISDOL) 1.25 MG (50000 UNIT) CAPS capsule TAKE 1 CAPSULE (50,000 UNITS TOTAL) BY MOUTH EVERY 7 (SEVEN) DAYS (Patient not taking: Reported on 05/04/2023) 12 capsule 0   No facility-administered medications prior to visit.    PAST MEDICAL HISTORY: Past Medical History:  Diagnosis Date   Adenocarcinoma, colon (HCC)    Anemia    iron deficiency   Anxiety    Atrial fibrillation (HCC)    Carpal tunnel syndrome    Cataract    REMOVED BILATERAL   Chronic diarrhea    Colon polyps    Tubular adenoma   Coronary artery disease     Decreased vision    Degenerative disk disease    Dementia (HCC)    Moderate   Depression    Diabetes mellitus, type 2 (HCC)    diet controlled   Dysrhythmia    h/o AFIB   Frequent falls    GERD (gastroesophageal reflux disease)    History of blood transfusion    After surgeries   Hyperlipidemia    Hyperparathyroidism (HCC)    Hypertension    IBS (irritable bowel syndrome)    Kidney disorder    Stage 3b chronic kidney disease   Osteoarthritis    Osteoporosis    Pneumonia  PONV (postoperative nausea and vomiting)    S/P coronary artery stent placement 2007    PAST SURGICAL HISTORY: Past Surgical History:  Procedure Laterality Date   CARDIAC CATHETERIZATION  08/2009   LAD stent patent. Insignificant CAD, otherwise EF 60-65%   CARPAL TUNNEL RELEASE     CATARACT EXTRACTION Bilateral 03/2015   CHOLECYSTECTOMY     COLON SURGERY     COLONOSCOPY     DILATION AND CURETTAGE OF UTERUS     HEMATOMA EVACUATION     after knee replacement   KNEE ARTHROSCOPY     bilateral   LUMBAR LAMINECTOMY N/A 11/26/2015   Procedure: LUMBAR DECOMPRESSIVE LAMINECTOMY L3-4 AND L4-5, LEFT L2-3 HEMILAMINECTOMY;  Surgeon: Kerrin Champagne, MD;  Location: [facility] OR;  Service: Orthopedics;  Laterality: N/A;   ORIF ELBOW FRACTURE Left 03/18/2021   Procedure: OPEN REDUCTION INTERNAL FIXATION (ORIF) ELBOW/OLECRANON FRACTURE;  Surgeon: Sheral Apley, MD;  Location: [facility];  Service: Orthopedics;  Laterality: Left;   ORIF ELBOW FRACTURE Left 07/28/2022   Procedure: OPEN REDUCTION INTERNAL FIXATION (ORIF) ELBOW/OLECRANON FRACTURE with screws AND PIN REMOVAL;  Surgeon: Sheral Apley, MD;  Location: [facility];  Service: Orthopedics;  Laterality: Left;  Block   PARTIAL COLECTOMY  11/2008   right, for adenocarcinoma   REPLACEMENT TOTAL KNEE  04/2010   right, complicated by hemarthrosis    REVERSE SHOULDER ARTHROPLASTY Right 05/28/2016   Procedure: REVERSE SHOULDER ARTHROPLASTY;  Surgeon: Cammy Copa, MD;   Location: [facility] OR;  Service: Orthopedics;  Laterality: Right;   TONSILLECTOMY     TOTAL KNEE ARTHROPLASTY Left 04/03/2014   Procedure: TOTAL KNEE ARTHROPLASTY;  Surgeon: Valeria Batman, MD;  Location: [facility];  Service: Orthopedics;  Laterality: Left;   TOTAL KNEE REVISION  03/08/2012   Procedure: TOTAL KNEE REVISION;  Surgeon: Valeria Batman, MD;  Location: [facility] OR;  Service: Orthopedics;  Laterality: Right;  removal total knee hardware and placement of antibiotic cement spacer and antibiotic beads   TOTAL KNEE REVISION  05/17/2012   Procedure: TOTAL KNEE REVISION;  Surgeon: Valeria Batman, MD;  Location: [facility] OR;  Service: Orthopedics;  Laterality: Right;  right total knee revision, removal of antibiotic spacer   UPPER GI ENDOSCOPY      FAMILY HISTORY: Family History  Problem Relation Age of Onset   Heart disease Mother        massive MI age 36   Heart attack Mother    Heart disease Father        Massive MI age 29   Prostate cancer Father    Colon polyps Sister    Diabetes Paternal Grandmother    Colon cancer Neg Hx    Esophageal cancer Neg Hx    Pancreatic cancer Neg Hx    Stomach cancer Neg Hx    Liver disease Neg Hx    Breast cancer Neg Hx     SOCIAL HISTORY: Social History   Socioeconomic History   Marital status: Divorced    Spouse name: Not on file   Number of children: 3   Years of education: Not on file   Highest education level: Not on file  Occupational History   Occupation: retired    Associate Professor: RETIRED  Tobacco Use   Smoking status: Never   Smokeless tobacco: Never  Vaping Use   Vaping status: Never Used  Substance and Sexual Activity   Alcohol use: No    Alcohol/week: 0.0 standard drinks of alcohol   Drug use: No  Sexual activity: Never  Other Topics Concern   Not on file  Social History Narrative   No regular exercise, limited due to knees   Diet- addicted to sweets, some fruit and veggies, water daily.   Right handed   Caffeine-1-2  occasionally   Lives in SNF   Social Determinants of Health   Financial Resource Strain: Low Risk  (08/18/2022)   Overall Financial Resource Strain (CARDIA)    Difficulty of Paying Living Expenses: Not hard at all  Food Insecurity: No Food Insecurity (08/18/2022)   Hunger Vital Sign    Worried About Running Out of Food in the Last Year: Never true    Ran Out of Food in the Last Year: Never true  Transportation Needs: No Transportation Needs (08/18/2022)   PRAPARE - Administrator, Civil Service (Medical): No    Lack of Transportation (Non-Medical): No  Physical Activity: Inactive (08/18/2022)   Exercise Vital Sign    Days of Exercise per Week: 0 days    Minutes of Exercise per Session: 0 min  Stress: No Stress Concern Present (08/18/2022)   [employer] of Occupational Health - Occupational Stress Questionnaire    Feeling of Stress : Only a little  Social Connections: Socially Isolated (08/18/2022)   Social Connection and Isolation Panel [NHANES]    Frequency of Communication with Friends and Family: Once a week    Frequency of Social Gatherings with Friends and Family: More than three times a week    Attends Religious Services: Never    Database administrator or Organizations: No    Attends Banker Meetings: Never    Marital Status: Divorced  Catering manager Violence: Not At Risk (08/18/2022)   Humiliation, Afraid, Rape, and Kick questionnaire    Fear of Current or Ex-Partner: No    Emotionally Abused: No    Physically Abused: No    Sexually Abused: No    PHYSICAL EXAM  GENERAL EXAM/CONSTITUTIONAL: Vitals:  Vitals:   05/04/23 1023  BP: 108/66  Pulse: 79  SpO2: 97%  Weight: 162 lb (73.5 kg)  Height: 5\' 5"  (1.651 m)   Body mass index is 26.96 kg/m. Wt Readings from Last 3 Encounters:  05/04/23 162 lb (73.5 kg)  01/11/23 154 lb 6.4 oz (70 kg)  12/11/22 158 lb 9.6 oz (71.9 kg)   Patient is in no distress; well developed, nourished  and groomed; neck is supple  MUSCULOSKELETAL: Gait, strength, tone, movements noted in Neurologic exam below  NEUROLOGIC: MENTAL STATUS:     05/04/2023   10:28 AM 07/02/2020   10:35 AM 07/03/2019   12:04 PM  MMSE - Mini Mental State Exam  Not completed: Unable to complete    Orientation to time  5 5  Orientation to Place  5 5  Registration  3 3  Attention/ Calculation  5 5  Recall  3 3  Language- repeat  1 1    CRANIAL NERVE:  2nd, 3rd, 4th, 6th- visual fields full to confrontation, extraocular muscles intact, no nystagmus 5th - facial sensation symmetric 7th - facial strength symmetric 8th - hearing intact 9th - palate elevates symmetrically, uvula midline 11th - shoulder shrug symmetric 12th - tongue protrusion midline  MOTOR:  normal bulk and tone, full strength in the BUE, BLE  SENSORY:  normal and symmetric to light touch  COORDINATION:  finger-nose-finger, fine finger movements normal  GAIT/STATION:  normal    DIAGNOSTIC DATA (LABS, IMAGING, TESTING) - I reviewed patient  records, labs, notes, testing and imaging myself where available.  Lab Results  Component Value Date   WBC 12.9 (H) 07/17/2022   HGB 14.3 07/17/2022   HCT 39.5 07/17/2022   MCV 91.0 07/17/2022   PLT 192 07/17/2022      Component Value Date/Time   NA 141 01/11/2023 0937   K 3.6 01/11/2023 0937   CL 102 01/11/2023 0937   CO2 22 01/11/2023 0937   GLUCOSE 196 (H) 01/11/2023 0937   GLUCOSE 168 (H) 07/28/2022 0855   GLUCOSE 100 (H) 09/07/2006 0849   BUN 19 01/11/2023 0937   CREATININE 1.46 (H) 01/11/2023 0937   CREATININE 1.42 (H) 01/11/2015 0830   CALCIUM 8.8 01/11/2023 0937   PROT 7.0 03/17/2022 0806   ALBUMIN 4.2 03/17/2022 0806   AST 29 03/17/2022 0806   ALT 30 03/17/2022 0806   ALKPHOS 58 03/17/2022 0806   BILITOT 0.7 03/17/2022 0806   GFRNONAA 38 (L) 07/28/2022 0855   GFRNONAA 36 (L) 01/11/2015 0830   GFRAA 30 (L) 11/15/2017 0839   GFRAA 42 (L) 01/11/2015 0830   Lab  Results  Component Value Date   CHOL 101 03/17/2022   HDL 41.90 03/17/2022   LDLCALC 32 03/17/2022   LDLDIRECT 50.0 10/22/2014   TRIG 136.0 03/17/2022   CHOLHDL 2 03/17/2022   Lab Results  Component Value Date   HGBA1C 6.0 (H) 07/28/2022   Lab Results  Component Value Date   VITAMINB12 217 09/05/2021   Lab Results  Component Value Date   TSH 4.414 12/25/2021    CT Head 07/17/2022 1. No acute intracranial CT findings or depressed skull fractures. 2. 1 cm saccular aneurysm of the medial aspect of the cavernous right ICA protruding into the right side of the sella, by CT appears unchanged.    ASSESSMENT AND PLAN  82 y.o. year old female with multiple medical conditions including hypertension, hyperlipidemia, diabetes mellitus, atrial fibrillation and dementia who is presenting for worsening dementia and agitation.  Patient is currently on Depakote 125 mg 3 times daily and olanzapine 2.5 mg nightly.  For her agitation, confusion and now crying, recommendations will be to increase the Depakote to 250 mg 3 times daily and to also increase the olanzapine to 5 mg nightly.  If symptoms do not improve within the next month, we will consider switching to a different antipsychotics such as quetiapine.  Continue to follow-up with PCP in return in 1 year or sooner if worse.   1. Severe dementia with agitation, unspecified dementia type [facility])      Patient Instructions  Increase Depakote to 250 mg threes time daily  Check CMP in 6 weeks Increase olanzapine to 5 mg nightly, if this is not enough to control her symptoms, we will consider switching to a different antipsychotic such as Seroquel Continue your other medications Continue to follow-up with your PCP return in 1 year or sooner if worse    No orders of the defined types were placed in this encounter.   Meds ordered this encounter  Medications   divalproex (DEPAKOTE SPRINKLE) 125 MG capsule    Sig: Take 2 capsules (250 mg  total) by mouth 3 (three) times daily.    Dispense:  180 capsule    Refill:  11   OLANZapine (ZYPREXA) 2.5 MG tablet    Sig: Take 2 tablets (5 mg total) by mouth at bedtime.    Dispense:  60 tablet    Refill:  0    Return in about 1  year (around 05/03/2024).    Windell Norfolk, MD 05/04/2023, 12:19 PM  [facility] [address] [place], [place] [address] [phone]

## 2023-05-04 NOTE — Patient Instructions (Addendum)
Increase Depakote to 250 mg threes time daily  Check CMP in 6 weeks Increase olanzapine to 5 mg nightly, if this is not enough to control her symptoms, we will consider switching to a different antipsychotic such as Seroquel Continue your other medications Continue to follow-up with your PCP return in 1 year or sooner if worse

## 2023-05-05 DIAGNOSIS — N1832 Chronic kidney disease, stage 3b: Secondary | ICD-10-CM | POA: Diagnosis not present

## 2023-05-05 DIAGNOSIS — R2681 Unsteadiness on feet: Secondary | ICD-10-CM | POA: Diagnosis not present

## 2023-05-06 DIAGNOSIS — N1832 Chronic kidney disease, stage 3b: Secondary | ICD-10-CM | POA: Diagnosis not present

## 2023-05-06 DIAGNOSIS — R2681 Unsteadiness on feet: Secondary | ICD-10-CM | POA: Diagnosis not present

## 2023-05-07 DIAGNOSIS — N1832 Chronic kidney disease, stage 3b: Secondary | ICD-10-CM | POA: Diagnosis not present

## 2023-05-07 DIAGNOSIS — R2681 Unsteadiness on feet: Secondary | ICD-10-CM | POA: Diagnosis not present

## 2023-05-10 DIAGNOSIS — N1832 Chronic kidney disease, stage 3b: Secondary | ICD-10-CM | POA: Diagnosis not present

## 2023-05-10 DIAGNOSIS — R2681 Unsteadiness on feet: Secondary | ICD-10-CM | POA: Diagnosis not present

## 2023-05-11 DIAGNOSIS — N1832 Chronic kidney disease, stage 3b: Secondary | ICD-10-CM | POA: Diagnosis not present

## 2023-05-11 DIAGNOSIS — R2681 Unsteadiness on feet: Secondary | ICD-10-CM | POA: Diagnosis not present

## 2023-05-13 DIAGNOSIS — M6281 Muscle weakness (generalized): Secondary | ICD-10-CM | POA: Diagnosis not present

## 2023-05-13 DIAGNOSIS — I10 Essential (primary) hypertension: Secondary | ICD-10-CM | POA: Diagnosis not present

## 2023-05-13 DIAGNOSIS — K219 Gastro-esophageal reflux disease without esophagitis: Secondary | ICD-10-CM | POA: Diagnosis not present

## 2023-05-13 DIAGNOSIS — E118 Type 2 diabetes mellitus with unspecified complications: Secondary | ICD-10-CM | POA: Diagnosis not present

## 2023-05-14 DIAGNOSIS — S52032D Displaced fracture of olecranon process with intraarticular extension of left ulna, subsequent encounter for closed fracture with routine healing: Secondary | ICD-10-CM | POA: Diagnosis not present

## 2023-05-14 DIAGNOSIS — N390 Urinary tract infection, site not specified: Secondary | ICD-10-CM | POA: Diagnosis not present

## 2023-05-14 DIAGNOSIS — R300 Dysuria: Secondary | ICD-10-CM | POA: Diagnosis not present

## 2023-05-14 DIAGNOSIS — M5116 Intervertebral disc disorders with radiculopathy, lumbar region: Secondary | ICD-10-CM | POA: Diagnosis not present

## 2023-05-17 DIAGNOSIS — N390 Urinary tract infection, site not specified: Secondary | ICD-10-CM | POA: Diagnosis not present

## 2023-05-19 ENCOUNTER — Other Ambulatory Visit: Payer: Self-pay | Admitting: Neurology

## 2023-05-19 DIAGNOSIS — I10 Essential (primary) hypertension: Secondary | ICD-10-CM | POA: Diagnosis not present

## 2023-05-20 DIAGNOSIS — R296 Repeated falls: Secondary | ICD-10-CM | POA: Diagnosis not present

## 2023-05-22 DIAGNOSIS — D649 Anemia, unspecified: Secondary | ICD-10-CM | POA: Diagnosis not present

## 2023-05-26 DIAGNOSIS — E119 Type 2 diabetes mellitus without complications: Secondary | ICD-10-CM | POA: Diagnosis not present

## 2023-05-27 DIAGNOSIS — I10 Essential (primary) hypertension: Secondary | ICD-10-CM | POA: Diagnosis not present

## 2023-05-27 DIAGNOSIS — E118 Type 2 diabetes mellitus with unspecified complications: Secondary | ICD-10-CM | POA: Diagnosis not present

## 2023-05-27 DIAGNOSIS — D72829 Elevated white blood cell count, unspecified: Secondary | ICD-10-CM | POA: Diagnosis not present

## 2023-06-05 DIAGNOSIS — I5042 Chronic combined systolic (congestive) and diastolic (congestive) heart failure: Secondary | ICD-10-CM | POA: Diagnosis not present

## 2023-06-05 DIAGNOSIS — I10 Essential (primary) hypertension: Secondary | ICD-10-CM | POA: Diagnosis not present

## 2023-06-07 DIAGNOSIS — R609 Edema, unspecified: Secondary | ICD-10-CM | POA: Diagnosis not present

## 2023-06-09 ENCOUNTER — Telehealth: Payer: Self-pay | Admitting: Cardiology

## 2023-06-09 DIAGNOSIS — I10 Essential (primary) hypertension: Secondary | ICD-10-CM | POA: Diagnosis not present

## 2023-06-09 DIAGNOSIS — R600 Localized edema: Secondary | ICD-10-CM | POA: Diagnosis not present

## 2023-06-09 DIAGNOSIS — D649 Anemia, unspecified: Secondary | ICD-10-CM | POA: Diagnosis not present

## 2023-06-09 DIAGNOSIS — E118 Type 2 diabetes mellitus with unspecified complications: Secondary | ICD-10-CM | POA: Diagnosis not present

## 2023-06-09 DIAGNOSIS — M6281 Muscle weakness (generalized): Secondary | ICD-10-CM | POA: Diagnosis not present

## 2023-06-09 DIAGNOSIS — N1831 Chronic kidney disease, stage 3a: Secondary | ICD-10-CM | POA: Diagnosis not present

## 2023-06-09 NOTE — Telephone Encounter (Signed)
Pt c/o swelling: STAT is pt has developed SOB within 24 hours  How much weight have you gained and in what time span? 10 lbs in two days   If swelling, where is the swelling located? Both legs and feet   Are you currently taking a fluid pill? Yes   Are you currently SOB? When walking   Do you have a log of your daily weights (if so, list)?   Have you gained 3 pounds in a day or 5 pounds in a week? 10 lbs in two days.   Have you traveled recently? No

## 2023-06-09 NOTE — Telephone Encounter (Signed)
Spoke with Tonya [name]. Patient has not gained 10 lbs in two days. On 96 she weighed 168, on 9/9 she weighed 172. Does not have a weight for today. She will go weigh patient today and call us back before we make recommendations.

## 2023-06-10 NOTE — Telephone Encounter (Signed)
Call to Diane and she states patient already has appt set up. She is set and they will call if concerns prior to appt.

## 2023-06-11 DIAGNOSIS — R079 Chest pain, unspecified: Secondary | ICD-10-CM | POA: Diagnosis not present

## 2023-06-11 DIAGNOSIS — I10 Essential (primary) hypertension: Secondary | ICD-10-CM | POA: Diagnosis not present

## 2023-06-12 DIAGNOSIS — M25511 Pain in right shoulder: Secondary | ICD-10-CM | POA: Diagnosis not present

## 2023-06-14 ENCOUNTER — Encounter (HOSPITAL_COMMUNITY): Payer: Self-pay

## 2023-06-14 ENCOUNTER — Emergency Department (HOSPITAL_COMMUNITY)
Admission: EM | Admit: 2023-06-14 | Discharge: 2023-06-14 | Disposition: A | Discharge status: Home / Self Care | Payer: Medicare Other | Attending: Emergency Medicine | Admitting: Emergency Medicine

## 2023-06-14 ENCOUNTER — Other Ambulatory Visit: Payer: Self-pay

## 2023-06-14 ENCOUNTER — Emergency Department (HOSPITAL_COMMUNITY): Payer: Medicare Other

## 2023-06-14 DIAGNOSIS — W0110XA Fall on same level from slipping, tripping and stumbling with subsequent striking against unspecified object, initial encounter: Secondary | ICD-10-CM | POA: Diagnosis not present

## 2023-06-14 DIAGNOSIS — R918 Other nonspecific abnormal finding of lung field: Secondary | ICD-10-CM | POA: Diagnosis not present

## 2023-06-14 DIAGNOSIS — Z743 Need for continuous supervision: Secondary | ICD-10-CM | POA: Diagnosis not present

## 2023-06-14 DIAGNOSIS — W19XXXA Unspecified fall, initial encounter: Secondary | ICD-10-CM | POA: Diagnosis not present

## 2023-06-14 DIAGNOSIS — M8580 Other specified disorders of bone density and structure, unspecified site: Secondary | ICD-10-CM | POA: Diagnosis not present

## 2023-06-14 DIAGNOSIS — S0083XA Contusion of other part of head, initial encounter: Secondary | ICD-10-CM | POA: Insufficient documentation

## 2023-06-14 DIAGNOSIS — R079 Chest pain, unspecified: Secondary | ICD-10-CM | POA: Diagnosis not present

## 2023-06-14 DIAGNOSIS — N390 Urinary tract infection, site not specified: Secondary | ICD-10-CM | POA: Diagnosis not present

## 2023-06-14 DIAGNOSIS — Z7901 Long term (current) use of anticoagulants: Secondary | ICD-10-CM | POA: Diagnosis not present

## 2023-06-14 DIAGNOSIS — R9089 Other abnormal findings on diagnostic imaging of central nervous system: Secondary | ICD-10-CM | POA: Diagnosis not present

## 2023-06-14 DIAGNOSIS — Y9389 Activity, other specified: Secondary | ICD-10-CM | POA: Diagnosis not present

## 2023-06-14 DIAGNOSIS — I10 Essential (primary) hypertension: Secondary | ICD-10-CM | POA: Diagnosis not present

## 2023-06-14 DIAGNOSIS — M47817 Spondylosis without myelopathy or radiculopathy, lumbosacral region: Secondary | ICD-10-CM | POA: Diagnosis not present

## 2023-06-14 DIAGNOSIS — S0101XA Laceration without foreign body of scalp, initial encounter: Secondary | ICD-10-CM | POA: Diagnosis not present

## 2023-06-14 DIAGNOSIS — S0990XA Unspecified injury of head, initial encounter: Secondary | ICD-10-CM | POA: Diagnosis not present

## 2023-06-14 DIAGNOSIS — Z96611 Presence of right artificial shoulder joint: Secondary | ICD-10-CM | POA: Diagnosis not present

## 2023-06-14 DIAGNOSIS — R6889 Other general symptoms and signs: Secondary | ICD-10-CM | POA: Diagnosis not present

## 2023-06-14 DIAGNOSIS — M25559 Pain in unspecified hip: Secondary | ICD-10-CM | POA: Diagnosis not present

## 2023-06-14 DIAGNOSIS — S199XXA Unspecified injury of neck, initial encounter: Secondary | ICD-10-CM | POA: Diagnosis not present

## 2023-06-14 DIAGNOSIS — R296 Repeated falls: Secondary | ICD-10-CM | POA: Diagnosis not present

## 2023-06-14 LAB — COMPREHENSIVE METABOLIC PANEL
ALT: 19 U/L (ref 0–44)
AST: 20 U/L (ref 15–41)
Albumin: 3.2 g/dL — ABNORMAL LOW (ref 3.5–5.0)
Alkaline Phosphatase: 62 U/L (ref 38–126)
Anion gap: 12 (ref 5–15)
BUN: 28 mg/dL — ABNORMAL HIGH (ref 8–23)
CO2: 22 mmol/L (ref 22–32)
Calcium: 8.2 mg/dL — ABNORMAL LOW (ref 8.9–10.3)
Chloride: 102 mmol/L (ref 98–111)
Creatinine, Ser: 1.58 mg/dL — ABNORMAL HIGH (ref 0.44–1.00)
GFR, Estimated: 32 mL/min — ABNORMAL LOW (ref >60)
Glucose, Bld: 169 mg/dL — ABNORMAL HIGH (ref 70–99)
Potassium: 3.7 mmol/L (ref 3.5–5.1)
Sodium: 136 mmol/L (ref 135–145)
Total Bilirubin: 0.6 mg/dL (ref 0.3–1.2)
Total Protein: 6.5 g/dL (ref 6.5–8.1)

## 2023-06-14 LAB — I-STAT CHEM 8, ED
BUN: 41 mg/dL — ABNORMAL HIGH (ref 8–23)
Calcium, Ion: 1.04 mmol/L — ABNORMAL LOW (ref 1.15–1.40)
Chloride: 104 mmol/L (ref 98–111)
Creatinine, Ser: 1.6 mg/dL — ABNORMAL HIGH (ref 0.44–1.00)
Glucose, Bld: 166 mg/dL — ABNORMAL HIGH (ref 70–99)
HCT: 35 % — ABNORMAL LOW (ref 36.0–46.0)
Hemoglobin: 11.9 g/dL — ABNORMAL LOW (ref 12.0–15.0)
Potassium: 4.7 mmol/L (ref 3.5–5.1)
Sodium: 138 mmol/L (ref 135–145)
TCO2: 24 mmol/L (ref 22–32)

## 2023-06-14 LAB — CBC WITH DIFFERENTIAL/PLATELET
Abs Immature Granulocytes: 0.07 10*3/uL (ref 0.00–0.07)
Basophils Absolute: 0.1 10*3/uL (ref 0.0–0.1)
Basophils Relative: 1 %
Eosinophils Absolute: 0.1 10*3/uL (ref 0.0–0.5)
Eosinophils Relative: 1 %
HCT: 37.3 % (ref 36.0–46.0)
Hemoglobin: 12.2 g/dL (ref 12.0–15.0)
Immature Granulocytes: 1 %
Lymphocytes Relative: 8 %
Lymphs Abs: 1.1 10*3/uL (ref 0.7–4.0)
MCH: 32.4 pg (ref 26.0–34.0)
MCHC: 32.7 g/dL (ref 30.0–36.0)
MCV: 98.9 fL (ref 80.0–100.0)
Monocytes Absolute: 2 10*3/uL — ABNORMAL HIGH (ref 0.1–1.0)
Monocytes Relative: 15 %
Neutro Abs: 10 10*3/uL — ABNORMAL HIGH (ref 1.7–7.7)
Neutrophils Relative %: 74 %
Platelets: 163 10*3/uL (ref 150–400)
RBC: 3.77 MIL/uL — ABNORMAL LOW (ref 3.87–5.11)
RDW: 12.7 % (ref 11.5–15.5)
WBC: 13.3 10*3/uL — ABNORMAL HIGH (ref 4.0–10.5)
nRBC: 0 % (ref 0.0–0.2)

## 2023-06-14 LAB — URINALYSIS, W/ REFLEX TO CULTURE (INFECTION SUSPECTED)
Bilirubin Urine: NEGATIVE
Glucose, UA: NEGATIVE mg/dL
Hgb urine dipstick: NEGATIVE
Ketones, ur: NEGATIVE mg/dL
Leukocytes,Ua: NEGATIVE
Nitrite: NEGATIVE
Protein, ur: 30 mg/dL — AB
Specific Gravity, Urine: 1.012 (ref 1.005–1.030)
pH: 6 (ref 5.0–8.0)

## 2023-06-14 LAB — TROPONIN I (HIGH SENSITIVITY): Troponin I (High Sensitivity): 6 ng/L (ref <18)

## 2023-06-14 LAB — TYPE AND SCREEN
ABO/RH(D): A POS
Antibody Screen: NEGATIVE

## 2023-06-14 MED ORDER — LORAZEPAM 2 MG/ML IJ SOLN
0.5000 mg | Freq: Once | INTRAMUSCULAR | Status: AC
  Administered 2023-06-14: 0.5 mg via INTRAVENOUS
  Filled 2023-06-14: qty 1

## 2023-06-14 NOTE — Progress Notes (Signed)
   06/14/23 1637  Spiritual Encounters  Type of Visit Initial  Care provided to: Pt and family  Conversation partners present during encounter Nurse  Referral source Trauma page  Reason for visit Trauma  OnCall Visit No   Chaplain responded to Trauma 2 page.  Pt was in the room with sister and daughter bedside.  Pt has dementia and was irritated to be in hospital with things connected to her.  Sister and daughter were already having a difficult time keeping her calm and chaplain's presence seemed to be making her unsettled, so chaplain left.  Chaplain services remain available by Spiritual Consult or for emergent cases, paging [phone]  Chaplain Raelene Bott, MDiv [name] [phone]

## 2023-06-14 NOTE — ED Triage Notes (Signed)
Pt coming in via [facility] from [facility]. Pt had an unwitnessed fall in the hall way at 0930. Pt has been on the floor sence fall. Pt has a head injury . C Coller in place.  Pt has been desaturating was placed on 4l. Pt denies shob.  Pt and o x1 at baseline. Pt in memory care.   Bp 152/90 Spo2 90% on 4l  Hr 94

## 2023-06-14 NOTE — ED Notes (Signed)
Daughter at bedside. Informed this NT that patient has been complaining of neck and right shoulder pain all weekend prior to fall.

## 2023-06-14 NOTE — ED Triage Notes (Signed)
Pt found lying r lateral

## 2023-06-14 NOTE — ED Notes (Signed)
Ptar called pt will be number 8 on the list for pick up

## 2023-06-14 NOTE — ED Notes (Signed)
Patient to CT with RN with continuous cardiac and pulse oximetry monitoring; c-spine precautions maintained.

## 2023-06-14 NOTE — Discharge Instructions (Addendum)
Return for any problem.  Urine obtained today is without clear evidence of infection.

## 2023-06-14 NOTE — ED Notes (Signed)
Report given over the phone to receiving staff, Meliton Rattan, at [facility].

## 2023-06-14 NOTE — ED Notes (Signed)
Patient independently ambulatory with steady gait with standby assist to/from bathroom.

## 2023-06-14 NOTE — Progress Notes (Signed)
Orthopedic Tech Progress Note Patient Details:  Tonya [name] [birth year] [id number] Level 2 Trauma  Patient ID: [name], female   DOB: Sep 22, 1941, 82 y.o.   MRN: [id number]  Smitty Pluck 06/14/2023, 11:39 AM

## 2023-06-14 NOTE — ED Provider Notes (Signed)
[place] EMERGENCY DEPARTMENT AT [facility] Provider Note   CSN: [id number] Arrival date & time: 06/14/23  1036     History  Chief Complaint  Patient presents with   Marletta Lor    Tonya [name] is a 82 y.o. female.  82 year old female with prior medical history as detailed below presents for evaluation.  Patient resides at [facility].  She had a fall while ambulating in the hallway.  Staff heard her fall and then found her immediately thereafter.  Patient did strike her head.  No LOC was reported.  Patient is without significant complaint other than pain to the anterior forehead where she has a contusion.  Family reports the patient did provide a urine sample to staff at her facility earlier today.  No results available for that UA.  The history is provided by the patient, medical records and a relative.       Home Medications Prior to Admission medications   Medication Sig Start Date End Date Taking? Authorizing Provider  acetaminophen (TYLENOL) 325 MG tablet Take 2 tablets (650 mg total) by mouth every 6 (six) hours as needed for mild pain or moderate pain. 07/30/22  Yes Gawne, Meghan M, PA-C  atorvastatin (LIPITOR) 20 MG tablet Take 1 tablet (20 mg total) by mouth daily. Patient taking differently: Take 20 mg by mouth at bedtime. 07/27/22  Yes Rollene Rotunda, MD  baclofen (LIORESAL) 10 MG tablet Take 1 tablet (10 mg total) by mouth 2 (two) times daily as needed for muscle spasms. 07/30/22 07/30/23 Yes Gawne, Meghan M, PA-C  Calcium Carb-Cholecalciferol (CALCIUM + VITAMIN D3) 600-5 MG-MCG TABS Take 1 tablet by mouth daily.   Yes [provider]  carvedilol (COREG) 12.5 MG tablet TAKE 1 TABLET (12.5MG  TOTAL) BY MOUTH TWICE A DAY WITH MEALS 11/02/21  Yes Bedsole, Amy E, MD  cetirizine (ZYRTEC) 10 MG tablet Take 10 mg by mouth at bedtime.   Yes [provider]  cholestyramine light (PREVALITE) 4 g packet USE 1 PACKET TWICE DAILY WITH A MEAL, AT LEAST 2  HOURS AWAY FROM OTHER MEDS PATIENT NEEDS OFFICE VISIT Patient taking differently: Take 4 g by mouth 2 (two) times daily with a meal. 04/20/22  Yes Rachael Fee, MD  citalopram (CELEXA) 40 MG tablet TAKE 1 TABLET BY MOUTH EVERY DAY 07/16/22  Yes Bedsole, Amy E, MD  diclofenac Sodium (VOLTAREN ARTHRITIS PAIN) 1 % GEL Apply 2 g topically 2 (two) times daily. To neck   Yes [provider]  diltiazem (CARDIZEM CD) 180 MG 24 hr capsule TAKE 1 CAPSULE BY MOUTH EVERY DAY 11/02/21  Yes Bedsole, Amy E, MD  divalproex (DEPAKOTE SPRINKLE) 125 MG capsule Take 2 capsules (250 mg total) by mouth 3 (three) times daily. Patient taking differently: Take 125 mg by mouth 3 (three) times daily. 05/04/23 04/28/24 Yes Camara, Amadou, MD  ELIQUIS 2.5 MG TABS tablet TAKE 1 TABLET BY MOUTH TWICE A DAY 11/02/21  Yes Bedsole, Amy E, MD  ferrous sulfate 325 (65 FE) MG EC tablet Take 325 mg by mouth 2 (two) times daily.   Yes Bedsole, Amy E, MD  furosemide (LASIX) 20 MG tablet Take 1 tablet (20 mg total) by mouth as needed for fluid or edema. Patient taking differently: Take 40 mg by mouth daily. 01/11/23  Yes Wittenborn, Deborah, NP  ipratropium-albuterol (DUONEB) 0.5-2.5 (3) MG/3ML SOLN Take 3 mLs by nebulization every 6 (six) hours as needed (shortness of breath).   Yes [provider]  isosorbide mononitrate (IMDUR) 30 MG 24 hr tablet TAKE 1 TABLET BY MOUTH EVERY DAY 11/03/21  Yes Rollene Rotunda, MD  losartan (COZAAR) 100 MG tablet TAKE 1 TABLET BY MOUTH EVERY DAY 04/06/22  Yes Rollene Rotunda, MD  magnesium oxide (MAG-OX) 400 (240 Mg) MG tablet Take 400 mg by mouth daily.   Yes [provider]  melatonin 3 MG TABS tablet Take 3 mg by mouth at bedtime.   Yes [provider]  Menthol, Topical Analgesic, (BIOFREEZE COOL THE PAIN) 4 % GEL Apply 1 Application topically 3 (three) times daily. To both shoulders   Yes [provider]  OLANZapine (ZYPREXA) 5 MG tablet Take 5 mg by mouth at  bedtime.   Yes [provider]  omeprazole (PRILOSEC) 20 MG capsule TAKE 1 CAPSULE BY MOUTH EVERY DAY 11/02/21  Yes Bedsole, Amy E, MD  ondansetron (ZOFRAN-ODT) 4 MG disintegrating tablet Take 1 tablet (4 mg total) by mouth every 8 (eight) hours as needed. 12/23/21  Yes Delton Prairie, MD  potassium chloride (KLOR-CON) 10 MEQ tablet Take 10 mEq by mouth daily.   Yes [provider]  traMADol (ULTRAM) 50 MG tablet Take 1 tablet (50 mg total) by mouth daily as needed for severe pain. Patient taking differently: Take 25 mg by mouth daily as needed for severe pain. 07/30/22 07/30/23 Yes Gawne, Meghan M, PA-C  Vitamin D, Ergocalciferol, (DRISDOL) 1.25 MG (50000 UNIT) CAPS capsule TAKE 1 CAPSULE (50,000 UNITS TOTAL) BY MOUTH EVERY 7 (SEVEN) DAYS Patient taking differently: Take 50,000 Units by mouth every Friday. 04/29/22  Yes Excell Seltzer, MD      Allergies    Macrobid [nitrofurantoin] and Augmentin [amoxicillin-pot clavulanate]    Review of Systems   Review of Systems  All other systems reviewed and are negative.   Physical Exam Updated Vital Signs BP 134/87   Pulse (!) 105   Temp 98.2 F (36.8 C)   Resp (!) 21   Ht 5\' 5"  (1.651 m)   Wt 73 kg   SpO2 99%   BMI 26.78 kg/m  Physical Exam Vitals and nursing note reviewed.  Constitutional:      General: She is not in acute distress.    Appearance: Normal appearance. She is well-developed.  HENT:     Head: Normocephalic.     Comments: Contusion with abrasion to right forehead Eyes:     Conjunctiva/sclera: Conjunctivae normal.     Pupils: Pupils are equal, round, and reactive to light.  Cardiovascular:     Rate and Rhythm: Normal rate and regular rhythm.     Heart sounds: Normal heart sounds.  Pulmonary:     Effort: Pulmonary effort is normal. No respiratory distress.     Breath sounds: Normal breath sounds.  Abdominal:     General: There is no distension.     Palpations: Abdomen is soft.     Tenderness: There is no  abdominal tenderness.  Musculoskeletal:        General: No deformity. Normal range of motion.     Cervical back: Normal range of motion and neck supple.  Skin:    General: Skin is warm and dry.  Neurological:     General: No focal deficit present.     Mental Status: She is alert and oriented to person, place, and time.     ED Results / Procedures / Treatments   Labs (all labs ordered are listed, but only abnormal results are displayed) Labs Reviewed  CBC WITH DIFFERENTIAL/PLATELET  COMPREHENSIVE METABOLIC PANEL  I-STAT CHEM 8, ED  TYPE AND SCREEN  TROPONIN I (HIGH SENSITIVITY)    EKG EKG Interpretation Date/Time:  Monday June 14 2023 10:44:49 EDT Ventricular Rate:  104 PR Interval:    QRS Duration:  75 QT Interval:  292 QTC Calculation: 381 R Axis:   56  Text Interpretation: Atrial fibrillation Ventricular premature complex Low voltage, extremity and precordial leads Borderline repolarization abnormality Confirmed by Kristine Royal [phone]) on 06/14/2023 10:53:02 AM  Radiology No results found.  Procedures Procedures    Medications Ordered in ED Medications - No data to display  ED Course/ Medical Decision Making/ A&P                                 Medical Decision Making Amount and/or Complexity of Data Reviewed Labs: ordered. Radiology: ordered.  Risk Prescription drug management.    Medical Screen Complete  This patient presented to the ED with complaint of fall.  This complaint involves an extensive number of treatment options. The initial differential diagnosis includes, but is not limited to, trauma from fall  This presentation is: Acute, Self-Limited, Previously Undiagnosed, Uncertain Prognosis, Complicated, Systemic Symptoms, and Threat to Life/Bodily Function  Patient with fall from standing and associated head injury.   Imaging without evidence of acute traumatic injury.   Screening labs without acute abnormality.  Patient  ambulating well and is at baseline per family. Family desires DC.   Importance of close follow up stressed. Strict return instructions given to family.   Additional history obtained:  Additional history obtained from [facility] External records from outside sources obtained and reviewed including prior ED visits and prior Inpatient records.    Lab Tests:  I ordered and personally interpreted labs.  The pertinent results include:  CBC CMP Trop UA   Imaging Studies ordered:  I ordered imaging studies including CT H/CS, XR of pelvis and chest  I independently visualized and interpreted obtained imaging which showed NAD I agree with the radiologist interpretation.   Problem List / ED Course:  Fall , head injury   Reevaluation:  After the interventions noted above, I reevaluated the patient and found that they have: improved  Disposition:  After consideration of the diagnostic results and the patients response to treatment, I feel that the patent would benefit from close outpatient followup.          Final Clinical Impression(s) / ED Diagnoses Final diagnoses:  Fall, initial encounter    Rx / DC Orders ED Discharge Orders     None         Wynetta Fines, MD 06/14/23 1614

## 2023-06-15 DIAGNOSIS — N390 Urinary tract infection, site not specified: Secondary | ICD-10-CM | POA: Diagnosis not present

## 2023-06-15 DIAGNOSIS — R296 Repeated falls: Secondary | ICD-10-CM | POA: Diagnosis not present

## 2023-06-15 DIAGNOSIS — M7981 Nontraumatic hematoma of soft tissue: Secondary | ICD-10-CM | POA: Diagnosis not present

## 2023-06-15 DIAGNOSIS — I10 Essential (primary) hypertension: Secondary | ICD-10-CM | POA: Diagnosis not present

## 2023-06-15 DIAGNOSIS — D72829 Elevated white blood cell count, unspecified: Secondary | ICD-10-CM | POA: Diagnosis not present

## 2023-06-15 DIAGNOSIS — S0191XA Laceration without foreign body of unspecified part of head, initial encounter: Secondary | ICD-10-CM | POA: Diagnosis not present

## 2023-06-16 DIAGNOSIS — I10 Essential (primary) hypertension: Secondary | ICD-10-CM | POA: Diagnosis not present

## 2023-06-16 DIAGNOSIS — E118 Type 2 diabetes mellitus with unspecified complications: Secondary | ICD-10-CM | POA: Diagnosis not present

## 2023-06-17 DIAGNOSIS — S52032D Displaced fracture of olecranon process with intraarticular extension of left ulna, subsequent encounter for closed fracture with routine healing: Secondary | ICD-10-CM | POA: Diagnosis not present

## 2023-06-17 DIAGNOSIS — M5116 Intervertebral disc disorders with radiculopathy, lumbar region: Secondary | ICD-10-CM | POA: Diagnosis not present

## 2023-06-19 DIAGNOSIS — I10 Essential (primary) hypertension: Secondary | ICD-10-CM | POA: Diagnosis not present

## 2023-06-21 ENCOUNTER — Ambulatory Visit: Payer: Medicare Other | Attending: Physician Assistant | Admitting: Physician Assistant

## 2023-06-21 ENCOUNTER — Encounter: Payer: Self-pay | Admitting: Physician Assistant

## 2023-06-21 VITALS — BP 106/60 | HR 72 | Ht 65.0 in | Wt 165.0 lb

## 2023-06-21 DIAGNOSIS — Z79899 Other long term (current) drug therapy: Secondary | ICD-10-CM

## 2023-06-21 DIAGNOSIS — E1159 Type 2 diabetes mellitus with other circulatory complications: Secondary | ICD-10-CM | POA: Diagnosis not present

## 2023-06-21 DIAGNOSIS — I152 Hypertension secondary to endocrine disorders: Secondary | ICD-10-CM

## 2023-06-21 DIAGNOSIS — I4821 Permanent atrial fibrillation: Secondary | ICD-10-CM

## 2023-06-21 DIAGNOSIS — I4891 Unspecified atrial fibrillation: Secondary | ICD-10-CM

## 2023-06-21 DIAGNOSIS — E119 Type 2 diabetes mellitus without complications: Secondary | ICD-10-CM | POA: Diagnosis not present

## 2023-06-21 DIAGNOSIS — I2510 Atherosclerotic heart disease of native coronary artery without angina pectoris: Secondary | ICD-10-CM | POA: Diagnosis not present

## 2023-06-21 DIAGNOSIS — E785 Hyperlipidemia, unspecified: Secondary | ICD-10-CM | POA: Diagnosis not present

## 2023-06-21 DIAGNOSIS — R600 Localized edema: Secondary | ICD-10-CM

## 2023-06-21 MED ORDER — POTASSIUM CHLORIDE CRYS ER 20 MEQ PO TBCR: 20.0000 meq | EXTENDED_RELEASE_TABLET | Freq: Every day | ORAL | 0 refills | Status: DC

## 2023-06-21 MED ORDER — FUROSEMIDE 40 MG PO TABS: 40.0000 mg | ORAL_TABLET | Freq: Every day | ORAL | 0 refills | Status: DC

## 2023-06-21 NOTE — Progress Notes (Unsigned)
Cardiology Office Note:  .   Date:  06/21/2023  ID:  Tonya [name], DOB April 27, 1941, MRN [id number] PCP: No primary care provider on file.  [facility] Providers Cardiologist:  Rollene Rotunda, MD { Click to update primary MD,subspecialty MD or APP then REFRESH:1}   History of Present Illness: .   Tonya [name] is a 82 y.o. female with PMH of CAD, permanent atrial fibrillation, HTN, HLD, DM II, CKD stage III and saccular aneurysm of proximal cavernous right ICA.  Patient has been followed by [name].  Patient underwent a cardiac catheterization in April 2007 after positive stress test and received a bare-metal stent to proximal LAD.  Repeat cardiac catheterization performed in 2010 showed patent stent, 40% left main.  Heart monitor in December 2019 showed A-fib, rare PVCs, well-controlled ventricular rate.  Myoview obtained on 07/04/2021 was low risk.  Last echocardiogram obtained in March 2023 demonstrated EF 55 to 60%, diastolic function could not be evaluated, small pericardial effusion.  Patient was last seen by [name] in March 2024.  She was felt not to be a candidate for Watchman device.  She was not following frequently enough to stop anticoagulation therapy.   Patient was last seen by [name] in April 2024.  She resides in a nursing home [facility].  She is mostly sedentary.  She was prescribed Lasix as needed.  More recently, patient presented to the hospital on 06/14/2023 with fall while ambulating in the hallway.  She did hit her head.  CT of the head was negative for acute intracranial process, she did have a right frontal scalp hematoma and laceration.  CT of the cervical spine was negative for acute process.  Chest x-ray showed hyperinflated Chim with left basilar scar or atelectasis.  Pelvic x-ray showed moderate degenerative changes consistent with osteopenia.  But no acute process.  Creatinine was 1.58.  White blood cell count elevated at 13.3.  Serial  troponin negative.  Urinalysis was negative.  Patient presents today for follow-up.  She denies any chest pain or shortness of breath.  She has quite significant dementia.  Family says she walks around frequently at the facility.  Recent fall was the only fall this year.  She still have a hematoma on her right scalp and ecchymosis in her right face.  Given her advanced age, frailty, CKD and fall risk, I am hesitant to permanently increase her diuretic.  Looking at her facility's medication list, instead of as needed dose of Lasix, she has been given daily dosing of 20 mg Lasix since April until 06/08/2023.  From 9/10 until 9/20, she was given 40 mg daily of Lasix.  Since 9/20, her Lasix dose was dropped back down to 20 mg daily.  On physical exam, she has 2+ edema.  She is wearing compression stocking.  I recommended leg elevation.  She does not drink a lot of fluid, I recommend that she drink at least 32 to 64 ounces of fluid per day.  Her weight is still at least 10 pounds up from earlier this year.  I will increase her Lasix to 40 mg daily for 1 week before going back down to 20 mg daily thereafter.  I will also increase her potassium supplement to 20 mEq daily for 1 week before going back down to 10 mEq daily thereafter.  I plan to reassess the patient in 3 weeks.  She will need a basic metabolic panel on the same day.  Addendum: Echocardiogram  report obtained.  This was performed on 06/11/2023.  EF 60%, normal RV, normal-sized left and the right atrium, normal mitral aortic valve, mild tricuspid valve insufficiency.  No sign of pericardial effusion.  Normal-sized aortic root.  Overall, this is a normal echocardiogram result. ROS: ***  Studies Reviewed: .        *** Risk Assessment/Calculations:   {Does this patient have ATRIAL FIBRILLATION?[phone]}         Physical Exam:   VS:  BP 106/60 (BP Location: Left Arm, Patient Position: Sitting, Cuff Size: Normal)   Pulse 72   Ht 5\' 5"  (1.651 m)    Wt 165 lb (74.8 kg)   BMI 27.46 kg/m    Wt Readings from Last 3 Encounters:  06/21/23 165 lb (74.8 kg)  06/14/23 160 lb 15 oz (73 kg)  05/04/23 162 lb (73.5 kg)    GEN: Well nourished, well developed in no acute distress NECK: No JVD; No carotid bruits CARDIAC: ***RRR, no murmurs, rubs, gallops RESPIRATORY:  Clear to auscultation without rales, wheezing or rhonchi  ABDOMEN: Soft, non-tender, non-distended EXTREMITIES:  No edema; No deformity   ASSESSMENT AND PLAN: .   ***    {Are you ordering a CV Procedure (e.g. stress test, cath, DCCV, TEE, etc)?   Press F2        :[id number]}  Dispo: ***  Signed, Azalee Course, PA

## 2023-06-21 NOTE — Patient Instructions (Signed)
Medication Instructions:  INCREASE LASIX TO 40 MG DAILY FOR 1 WEEK THEN 20 MG AFTER.  INCREASE POTASSIUM TO 20 MEQ DAILY FOR 1 WEEK THEN 10 MEQ AFTER.  *If you need a refill on your cardiac medications before your next appointment, please call your pharmacy*   Lab Work: BMP IN 3 WEEKS SAME DAY AS FOLLOW UP. If you have labs (blood work) drawn today and your tests are completely normal, you will receive your results only by: MyChart Message (if you have MyChart) OR A paper copy in the mail If you have any lab test that is abnormal or we need to change your treatment, we will call you to review the results.   Testing/Procedures: NO TESTING   Follow-Up: At [facility], you and your health needs are our priority.  As part of our continuing mission to provide you with exceptional heart care, we have created designated Provider Care Teams.  These Care Teams include your primary Cardiologist (physician) and Advanced Practice Providers (APPs -  Physician Assistants and Nurse Practitioners) who all work together to provide you with the care you need, when you need it.  We recommend signing up for the patient portal called "MyChart".  Sign up information is provided on this After Visit Summary.  MyChart is used to connect with patients for Virtual Visits (Telemedicine).  Patients are able to view lab/test results, encounter notes, upcoming appointments, etc.  Non-urgent messages can be sent to your provider as well.   To learn more about what you can do with MyChart, go to [email or web address].    Your next appointment:   3 week(s)  Provider:   Azalee Course, PA

## 2023-06-22 DIAGNOSIS — N2581 Secondary hyperparathyroidism of renal origin: Secondary | ICD-10-CM | POA: Diagnosis not present

## 2023-06-22 DIAGNOSIS — I10 Essential (primary) hypertension: Secondary | ICD-10-CM | POA: Diagnosis not present

## 2023-06-22 DIAGNOSIS — R600 Localized edema: Secondary | ICD-10-CM | POA: Diagnosis not present

## 2023-06-22 DIAGNOSIS — E1122 Type 2 diabetes mellitus with diabetic chronic kidney disease: Secondary | ICD-10-CM | POA: Diagnosis not present

## 2023-06-22 DIAGNOSIS — N1832 Chronic kidney disease, stage 3b: Secondary | ICD-10-CM | POA: Diagnosis not present

## 2023-06-24 DIAGNOSIS — L84 Corns and callosities: Secondary | ICD-10-CM | POA: Diagnosis not present

## 2023-06-24 DIAGNOSIS — I739 Peripheral vascular disease, unspecified: Secondary | ICD-10-CM | POA: Diagnosis not present

## 2023-06-24 DIAGNOSIS — L602 Onychogryphosis: Secondary | ICD-10-CM | POA: Diagnosis not present

## 2023-06-24 DIAGNOSIS — L603 Nail dystrophy: Secondary | ICD-10-CM | POA: Diagnosis not present

## 2023-06-25 DIAGNOSIS — R278 Other lack of coordination: Secondary | ICD-10-CM | POA: Diagnosis not present

## 2023-06-25 DIAGNOSIS — R2681 Unsteadiness on feet: Secondary | ICD-10-CM | POA: Diagnosis not present

## 2023-06-27 DIAGNOSIS — R2681 Unsteadiness on feet: Secondary | ICD-10-CM | POA: Diagnosis not present

## 2023-06-27 DIAGNOSIS — R278 Other lack of coordination: Secondary | ICD-10-CM | POA: Diagnosis not present

## 2023-06-28 DIAGNOSIS — R2681 Unsteadiness on feet: Secondary | ICD-10-CM | POA: Diagnosis not present

## 2023-06-28 DIAGNOSIS — R278 Other lack of coordination: Secondary | ICD-10-CM | POA: Diagnosis not present

## 2023-06-29 DIAGNOSIS — Z7185 Encounter for immunization safety counseling: Secondary | ICD-10-CM | POA: Diagnosis not present

## 2023-06-29 DIAGNOSIS — R2681 Unsteadiness on feet: Secondary | ICD-10-CM | POA: Diagnosis not present

## 2023-06-29 DIAGNOSIS — R278 Other lack of coordination: Secondary | ICD-10-CM | POA: Diagnosis not present

## 2023-06-29 DIAGNOSIS — Z23 Encounter for immunization: Secondary | ICD-10-CM | POA: Diagnosis not present

## 2023-06-30 DIAGNOSIS — R278 Other lack of coordination: Secondary | ICD-10-CM | POA: Diagnosis not present

## 2023-06-30 DIAGNOSIS — R2681 Unsteadiness on feet: Secondary | ICD-10-CM | POA: Diagnosis not present

## 2023-07-01 DIAGNOSIS — K219 Gastro-esophageal reflux disease without esophagitis: Secondary | ICD-10-CM | POA: Diagnosis not present

## 2023-07-01 DIAGNOSIS — M6281 Muscle weakness (generalized): Secondary | ICD-10-CM | POA: Diagnosis not present

## 2023-07-01 DIAGNOSIS — E785 Hyperlipidemia, unspecified: Secondary | ICD-10-CM | POA: Diagnosis not present

## 2023-07-01 DIAGNOSIS — E118 Type 2 diabetes mellitus with unspecified complications: Secondary | ICD-10-CM | POA: Diagnosis not present

## 2023-07-01 DIAGNOSIS — I10 Essential (primary) hypertension: Secondary | ICD-10-CM | POA: Diagnosis not present

## 2023-07-02 ENCOUNTER — Telehealth: Payer: Self-pay | Admitting: Physician Assistant

## 2023-07-02 DIAGNOSIS — R2681 Unsteadiness on feet: Secondary | ICD-10-CM | POA: Diagnosis not present

## 2023-07-02 DIAGNOSIS — R278 Other lack of coordination: Secondary | ICD-10-CM | POA: Diagnosis not present

## 2023-07-02 NOTE — Telephone Encounter (Signed)
Fax came through from [facility] with cardio interpretation  In doctors box

## 2023-07-05 DIAGNOSIS — R600 Localized edema: Secondary | ICD-10-CM | POA: Diagnosis not present

## 2023-07-05 DIAGNOSIS — N1831 Chronic kidney disease, stage 3a: Secondary | ICD-10-CM | POA: Diagnosis not present

## 2023-07-05 DIAGNOSIS — E118 Type 2 diabetes mellitus with unspecified complications: Secondary | ICD-10-CM | POA: Diagnosis not present

## 2023-07-05 DIAGNOSIS — I10 Essential (primary) hypertension: Secondary | ICD-10-CM | POA: Diagnosis not present

## 2023-07-05 DIAGNOSIS — D649 Anemia, unspecified: Secondary | ICD-10-CM | POA: Diagnosis not present

## 2023-07-07 DIAGNOSIS — Z23 Encounter for immunization: Secondary | ICD-10-CM | POA: Diagnosis not present

## 2023-07-07 DIAGNOSIS — R2681 Unsteadiness on feet: Secondary | ICD-10-CM | POA: Diagnosis not present

## 2023-07-07 DIAGNOSIS — R278 Other lack of coordination: Secondary | ICD-10-CM | POA: Diagnosis not present

## 2023-07-08 DIAGNOSIS — R2681 Unsteadiness on feet: Secondary | ICD-10-CM | POA: Diagnosis not present

## 2023-07-08 DIAGNOSIS — R278 Other lack of coordination: Secondary | ICD-10-CM | POA: Diagnosis not present

## 2023-07-08 NOTE — Telephone Encounter (Signed)
I did not receive as of yet

## 2023-07-09 DIAGNOSIS — I10 Essential (primary) hypertension: Secondary | ICD-10-CM | POA: Diagnosis not present

## 2023-07-09 DIAGNOSIS — E118 Type 2 diabetes mellitus with unspecified complications: Secondary | ICD-10-CM | POA: Diagnosis not present

## 2023-07-09 DIAGNOSIS — R278 Other lack of coordination: Secondary | ICD-10-CM | POA: Diagnosis not present

## 2023-07-09 DIAGNOSIS — R2681 Unsteadiness on feet: Secondary | ICD-10-CM | POA: Diagnosis not present

## 2023-07-13 DIAGNOSIS — S52032D Displaced fracture of olecranon process with intraarticular extension of left ulna, subsequent encounter for closed fracture with routine healing: Secondary | ICD-10-CM | POA: Diagnosis not present

## 2023-07-13 DIAGNOSIS — R278 Other lack of coordination: Secondary | ICD-10-CM | POA: Diagnosis not present

## 2023-07-13 DIAGNOSIS — R2681 Unsteadiness on feet: Secondary | ICD-10-CM | POA: Diagnosis not present

## 2023-07-13 DIAGNOSIS — M5116 Intervertebral disc disorders with radiculopathy, lumbar region: Secondary | ICD-10-CM | POA: Diagnosis not present

## 2023-07-14 DIAGNOSIS — R2681 Unsteadiness on feet: Secondary | ICD-10-CM | POA: Diagnosis not present

## 2023-07-14 DIAGNOSIS — R278 Other lack of coordination: Secondary | ICD-10-CM | POA: Diagnosis not present

## 2023-07-15 DIAGNOSIS — R2681 Unsteadiness on feet: Secondary | ICD-10-CM | POA: Diagnosis not present

## 2023-07-15 DIAGNOSIS — R278 Other lack of coordination: Secondary | ICD-10-CM | POA: Diagnosis not present

## 2023-07-16 ENCOUNTER — Encounter: Payer: Self-pay | Admitting: Physician Assistant

## 2023-07-16 ENCOUNTER — Ambulatory Visit: Payer: Medicare Other | Attending: Physician Assistant | Admitting: Physician Assistant

## 2023-07-16 VITALS — BP 130/86 | HR 88 | Wt 168.8 lb

## 2023-07-16 DIAGNOSIS — E119 Type 2 diabetes mellitus without complications: Secondary | ICD-10-CM | POA: Diagnosis not present

## 2023-07-16 DIAGNOSIS — I152 Hypertension secondary to endocrine disorders: Secondary | ICD-10-CM

## 2023-07-16 DIAGNOSIS — I4821 Permanent atrial fibrillation: Secondary | ICD-10-CM | POA: Diagnosis not present

## 2023-07-16 DIAGNOSIS — I2510 Atherosclerotic heart disease of native coronary artery without angina pectoris: Secondary | ICD-10-CM

## 2023-07-16 DIAGNOSIS — R2681 Unsteadiness on feet: Secondary | ICD-10-CM | POA: Diagnosis not present

## 2023-07-16 DIAGNOSIS — E785 Hyperlipidemia, unspecified: Secondary | ICD-10-CM

## 2023-07-16 DIAGNOSIS — E1159 Type 2 diabetes mellitus with other circulatory complications: Secondary | ICD-10-CM | POA: Diagnosis not present

## 2023-07-16 DIAGNOSIS — R600 Localized edema: Secondary | ICD-10-CM

## 2023-07-16 DIAGNOSIS — R278 Other lack of coordination: Secondary | ICD-10-CM | POA: Diagnosis not present

## 2023-07-16 NOTE — Progress Notes (Signed)
Cardiology Office Note:  .   Date:  07/16/2023  ID:  Tonya [name], DOB 1940/10/05, MRN [id number] PCP: No primary care provider on file.  [facility] Providers Cardiologist:  Rollene Rotunda, MD    History of Present Illness: .   Tonya [name] is a 82 y.o. female with PMH of CAD, permanent atrial fibrillation, HTN, HLD, DM II, CKD stage III, severe dementia and saccular aneurysm of proximal cavernous right ICA.  Patient has been followed by [name].  Patient underwent a cardiac catheterization in April 2007 after positive stress test and received a bare-metal stent to proximal LAD.  Repeat cardiac catheterization performed in 2010 showed patent stent, 40% left main.  Heart monitor in December 2019 showed A-fib, rare PVCs, well-controlled ventricular rate.  Myoview obtained on 07/04/2021 was low risk.  Last echocardiogram obtained in March 2023 demonstrated EF 55 to 60%, diastolic function could not be evaluated, small pericardial effusion.  Patient was last seen by [name] in March 2024.  She was felt not to be a candidate for Watchman device.  She was not falling frequently enough to stop anticoagulation therapy.   Patient was seen by [name] in April 2024.  She resides in a nursing home [facility].  She is mostly sedentary.  She was prescribed Lasix as needed.  More recently, patient presented to the hospital on 06/14/2023 with fall while ambulating in the hallway.  She did hit her head.  CT of the head was negative for acute intracranial process, she did have a right frontal scalp hematoma and laceration.  CT of the cervical spine was negative for acute process.  Chest x-ray showed hyperinflation with left basilar scar or atelectasis.  Pelvic x-ray showed moderate degenerative changes consistent with osteopenia, no acute process.  Creatinine was 1.58.  White blood cell count elevated at 13.3.  Serial troponin negative.  Urinalysis was negative.  Echocardiogram  obtained on 06/11/2023 showed EF 60%, normal RV, normal-sized left and the right atrium, normal mitral valve, mild tricuspid regurgitation, no sign of pericardial effusion, normal-sized aortic root.  I last saw the patient on 06/21/2023, looking at her facility's list, instead of as needed dose of Lasix, she has been given daily dosing of Lasix 20 mg since April until 06/08/2023.  From 9/10 until 9/20, she was given 40 mg daily of Lasix.  Since 9/20, her Lasix has been dropped back down to 20 mg daily.  On physical exam, she had 2+ edema.  Given her advanced age, frailty, fall risk and the CKD, I was hesitant to permanently increase her diuretic.  I recommended leg elevation and a compression stocking.  I also recommended she drink 32 to 64 ounces of fluid per day.  Since her weight was still up 10 pounds from earlier this year, I increased her Lasix to 40 mg daily for 1 week before going back down to 20 mg daily thereafter.  I also increased her potassium for 1 week before going back down to the 10 mEq daily thereafter.  She presents today for reassessment.  Patient presents today accompanied by daughter.  Daughter says patient walks around all the time at [facility].  She continued to have 2+ lower extremity edema, right worse than left.  She has some pain with deep palpation of lower extremity.  There is no significant redness.  Her lung is clear.  She denies any chest pain or shortness of breath.  Daughter is not sure if she  is elevating her leg frequently enough.  After completing higher dose of diuretic for 1 week, she is now back down to 20 mg daily daily of Lasix and the 10 mEq daily of potassium chloride.  Given her advanced age, at this time, I did not recommend further increase her diuretic permanently.  I recommended continue on the current therapy.  We will see her back in 5 to 6 months for reassessment.  If she has increased lower extremity edema, she can take extra tablet of Lasix and potassium  for 3 days before going back to the previous dose.   ROS:   She denies chest pain, palpitations, dyspnea, pnd, orthopnea, n, v, dizziness, syncope, weight gain, or early satiety. All other systems reviewed and are otherwise negative except as noted above.   Patient does have lower extremity edema.  Studies Reviewed: .        Cardiac Studies & Procedures     STRESS TESTS  MYOCARDIAL PERFUSION IMAGING 07/04/2021  Narrative   The study is normal. The study is low risk.   This was a non-gated study.   No ST deviation was noted.   Prior study available for comparison from 02/09/2012.   ECHOCARDIOGRAM  ECHOCARDIOGRAM COMPLETE 12/26/2021  Narrative ECHOCARDIOGRAM REPORT    Patient Name:   Tonya [name] Date of Exam: 12/26/2021 Medical Rec #:  [id number]       Height:       65.0 in Accession #:    [id number]      Weight:       159.4 lb Date of Birth:  1940-12-03       BSA:          1.796 m Patient Age:    80 years        BP:           113/68 mmHg Patient Gender: F               HR:           101 bpm. Exam Location:  Inpatient  Procedure: 2D Echo, Cardiac Doppler and Color Doppler  Indications:    Cardiomegaly  History:        Patient has no prior history of Echocardiogram examinations. Arrythmias:Atrial Fibrillation; Risk Factors:Hypertension and Diabetes.  Sonographer:    [name] Referring Phys: [id number] [name]  IMPRESSIONS   1. Left ventricular ejection fraction, by estimation, is 55 to 60%. The left ventricle has normal function. The left ventricle has no regional wall motion abnormalities. Left ventricular diastolic function could not be evaluated. 2. Right ventricular systolic function is normal. The right ventricular size is normal. There is mildly elevated pulmonary artery systolic pressure. 3. Left atrial size was moderately dilated. 4. Right atrial size was moderately dilated. 5. A small pericardial effusion is present. There is no evidence of  cardiac tamponade. 6. The mitral valve is normal in structure. Mild mitral valve regurgitation. No evidence of mitral stenosis. 7. The aortic valve is tricuspid. Aortic valve regurgitation is not visualized. No aortic stenosis is present. 8. The inferior vena cava is dilated in size with <50% respiratory variability, suggesting right atrial pressure of 15 mmHg.  Comparison(s): No prior Echocardiogram.  Conclusion(s)/Recommendation(s): Otherwise normal echocardiogram, with minor abnormalities described in the report.  FINDINGS Left Ventricle: Left ventricular ejection fraction, by estimation, is 55 to 60%. The left ventricle has normal function. The left ventricle has no regional wall motion abnormalities. The left ventricular internal cavity size was  is elevating her leg frequently enough.  After completing higher dose of diuretic for 1 week, she is now back down to 20 mg daily daily of Lasix and the 10 mEq daily of potassium chloride.  Given her advanced age, at this time, I did not recommend further increase her diuretic permanently.  I recommended continue on the current therapy.  We will see her back in 5 to 6 months for reassessment.  If she has increased lower extremity edema, she can take extra tablet of Lasix and potassium  for 3 days before going back to the previous dose.   ROS:   She denies chest pain, palpitations, dyspnea, pnd, orthopnea, n, v, dizziness, syncope, weight gain, or early satiety. All other systems reviewed and are otherwise negative except as noted above.   Patient does have lower extremity edema.  Studies Reviewed: .        Cardiac Studies & Procedures     STRESS TESTS  MYOCARDIAL PERFUSION IMAGING 07/04/2021  Narrative   The study is normal. The study is low risk.   This was a non-gated study.   No ST deviation was noted.   Prior study available for comparison from 02/09/2012.   ECHOCARDIOGRAM  ECHOCARDIOGRAM COMPLETE 12/26/2021  Narrative ECHOCARDIOGRAM REPORT    Patient Name:   Tonya [name] Date of Exam: 12/26/2021 Medical Rec #:  [id number]       Height:       65.0 in Accession #:    [id number]      Weight:       159.4 lb Date of Birth:  1940-12-03       BSA:          1.796 m Patient Age:    80 years        BP:           113/68 mmHg Patient Gender: F               HR:           101 bpm. Exam Location:  Inpatient  Procedure: 2D Echo, Cardiac Doppler and Color Doppler  Indications:    Cardiomegaly  History:        Patient has no prior history of Echocardiogram examinations. Arrythmias:Atrial Fibrillation; Risk Factors:Hypertension and Diabetes.  Sonographer:    [name] Referring Phys: [id number] [name]  IMPRESSIONS   1. Left ventricular ejection fraction, by estimation, is 55 to 60%. The left ventricle has normal function. The left ventricle has no regional wall motion abnormalities. Left ventricular diastolic function could not be evaluated. 2. Right ventricular systolic function is normal. The right ventricular size is normal. There is mildly elevated pulmonary artery systolic pressure. 3. Left atrial size was moderately dilated. 4. Right atrial size was moderately dilated. 5. A small pericardial effusion is present. There is no evidence of  cardiac tamponade. 6. The mitral valve is normal in structure. Mild mitral valve regurgitation. No evidence of mitral stenosis. 7. The aortic valve is tricuspid. Aortic valve regurgitation is not visualized. No aortic stenosis is present. 8. The inferior vena cava is dilated in size with <50% respiratory variability, suggesting right atrial pressure of 15 mmHg.  Comparison(s): No prior Echocardiogram.  Conclusion(s)/Recommendation(s): Otherwise normal echocardiogram, with minor abnormalities described in the report.  FINDINGS Left Ventricle: Left ventricular ejection fraction, by estimation, is 55 to 60%. The left ventricle has normal function. The left ventricle has no regional wall motion abnormalities. The left ventricular internal cavity size was  is elevating her leg frequently enough.  After completing higher dose of diuretic for 1 week, she is now back down to 20 mg daily daily of Lasix and the 10 mEq daily of potassium chloride.  Given her advanced age, at this time, I did not recommend further increase her diuretic permanently.  I recommended continue on the current therapy.  We will see her back in 5 to 6 months for reassessment.  If she has increased lower extremity edema, she can take extra tablet of Lasix and potassium  for 3 days before going back to the previous dose.   ROS:   She denies chest pain, palpitations, dyspnea, pnd, orthopnea, n, v, dizziness, syncope, weight gain, or early satiety. All other systems reviewed and are otherwise negative except as noted above.   Patient does have lower extremity edema.  Studies Reviewed: .        Cardiac Studies & Procedures     STRESS TESTS  MYOCARDIAL PERFUSION IMAGING 07/04/2021  Narrative   The study is normal. The study is low risk.   This was a non-gated study.   No ST deviation was noted.   Prior study available for comparison from 02/09/2012.   ECHOCARDIOGRAM  ECHOCARDIOGRAM COMPLETE 12/26/2021  Narrative ECHOCARDIOGRAM REPORT    Patient Name:   Tonya [name] Date of Exam: 12/26/2021 Medical Rec #:  [id number]       Height:       65.0 in Accession #:    [id number]      Weight:       159.4 lb Date of Birth:  1940-12-03       BSA:          1.796 m Patient Age:    80 years        BP:           113/68 mmHg Patient Gender: F               HR:           101 bpm. Exam Location:  Inpatient  Procedure: 2D Echo, Cardiac Doppler and Color Doppler  Indications:    Cardiomegaly  History:        Patient has no prior history of Echocardiogram examinations. Arrythmias:Atrial Fibrillation; Risk Factors:Hypertension and Diabetes.  Sonographer:    [name] Referring Phys: [id number] [name]  IMPRESSIONS   1. Left ventricular ejection fraction, by estimation, is 55 to 60%. The left ventricle has normal function. The left ventricle has no regional wall motion abnormalities. Left ventricular diastolic function could not be evaluated. 2. Right ventricular systolic function is normal. The right ventricular size is normal. There is mildly elevated pulmonary artery systolic pressure. 3. Left atrial size was moderately dilated. 4. Right atrial size was moderately dilated. 5. A small pericardial effusion is present. There is no evidence of  cardiac tamponade. 6. The mitral valve is normal in structure. Mild mitral valve regurgitation. No evidence of mitral stenosis. 7. The aortic valve is tricuspid. Aortic valve regurgitation is not visualized. No aortic stenosis is present. 8. The inferior vena cava is dilated in size with <50% respiratory variability, suggesting right atrial pressure of 15 mmHg.  Comparison(s): No prior Echocardiogram.  Conclusion(s)/Recommendation(s): Otherwise normal echocardiogram, with minor abnormalities described in the report.  FINDINGS Left Ventricle: Left ventricular ejection fraction, by estimation, is 55 to 60%. The left ventricle has normal function. The left ventricle has no regional wall motion abnormalities. The left ventricular internal cavity size was

## 2023-07-16 NOTE — Patient Instructions (Signed)
Medication Instructions:  NO CHANGES *If you need a refill on your cardiac medications before your next appointment, please call your pharmacy*   Lab Work: NO LABS If you have labs (blood work) drawn today and your tests are completely normal, you will receive your results only by: MyChart Message (if you have MyChart) OR A paper copy in the mail If you have any lab test that is abnormal or we need to change your treatment, we will call you to review the results.   Testing/Procedures: NO TESTING   Follow-Up: At [facility], you and your health needs are our priority.  As part of our continuing mission to provide you with exceptional heart care, we have created designated Provider Care Teams.  These Care Teams include your primary Cardiologist (physician) and Advanced Practice Providers (APPs -  Physician Assistants and Nurse Practitioners) who all work together to provide you with the care you need, when you need it.   Your next appointment:   5-6 month(s)  Provider:   Rollene Rotunda, MD   Other Instructions CONTINUE CURRENT DOSE OF DIURETIC AND LEG ELEVATION INTERMITTENTLY.

## 2023-07-20 DIAGNOSIS — R2681 Unsteadiness on feet: Secondary | ICD-10-CM | POA: Diagnosis not present

## 2023-07-20 DIAGNOSIS — R278 Other lack of coordination: Secondary | ICD-10-CM | POA: Diagnosis not present

## 2023-08-02 DIAGNOSIS — I130 Hypertensive heart and chronic kidney disease with heart failure and stage 1 through stage 4 chronic kidney disease, or unspecified chronic kidney disease: Secondary | ICD-10-CM | POA: Diagnosis not present

## 2023-08-02 DIAGNOSIS — D649 Anemia, unspecified: Secondary | ICD-10-CM | POA: Diagnosis not present

## 2023-08-02 DIAGNOSIS — R600 Localized edema: Secondary | ICD-10-CM | POA: Diagnosis not present

## 2023-08-02 DIAGNOSIS — E119 Type 2 diabetes mellitus without complications: Secondary | ICD-10-CM | POA: Diagnosis not present

## 2023-08-04 DIAGNOSIS — D649 Anemia, unspecified: Secondary | ICD-10-CM | POA: Diagnosis not present

## 2023-08-04 DIAGNOSIS — E559 Vitamin D deficiency, unspecified: Secondary | ICD-10-CM | POA: Diagnosis not present

## 2023-08-04 DIAGNOSIS — E119 Type 2 diabetes mellitus without complications: Secondary | ICD-10-CM | POA: Diagnosis not present

## 2023-08-10 DIAGNOSIS — K219 Gastro-esophageal reflux disease without esophagitis: Secondary | ICD-10-CM | POA: Diagnosis not present

## 2023-08-10 DIAGNOSIS — M6281 Muscle weakness (generalized): Secondary | ICD-10-CM | POA: Diagnosis not present

## 2023-08-10 DIAGNOSIS — I10 Essential (primary) hypertension: Secondary | ICD-10-CM | POA: Diagnosis not present

## 2023-08-10 DIAGNOSIS — K589 Irritable bowel syndrome without diarrhea: Secondary | ICD-10-CM | POA: Diagnosis not present

## 2023-08-10 DIAGNOSIS — E119 Type 2 diabetes mellitus without complications: Secondary | ICD-10-CM | POA: Diagnosis not present

## 2023-08-11 DIAGNOSIS — M5116 Intervertebral disc disorders with radiculopathy, lumbar region: Secondary | ICD-10-CM | POA: Diagnosis not present

## 2023-08-12 DIAGNOSIS — E118 Type 2 diabetes mellitus with unspecified complications: Secondary | ICD-10-CM | POA: Diagnosis not present

## 2023-08-12 DIAGNOSIS — I10 Essential (primary) hypertension: Secondary | ICD-10-CM | POA: Diagnosis not present

## 2023-11-02 IMAGING — CT CT SHOULDER*R* W/O CM
1 of 2 series · 9 of 14 positions shown, 12 images · non-contrast
Comparison: X-ray 09/10/2021

CLINICAL DATA: Right shoulder pain with limited range of motion and
weakness for 2 weeks. History of reverse shoulder arthroplasty in
1491

EXAM:
CT OF THE UPPER RIGHT EXTREMITY WITHOUT CONTRAST
TECHNIQUE: Multidetector CT imaging of the upper right extremity was performed
according to the standard protocol.

[Series 5: thin soft · axial · 0.55mm/px · z∈[-709,-538]mm · 9 of 357 slices shown, 12 images]
[im 36/357  soft-tissue]
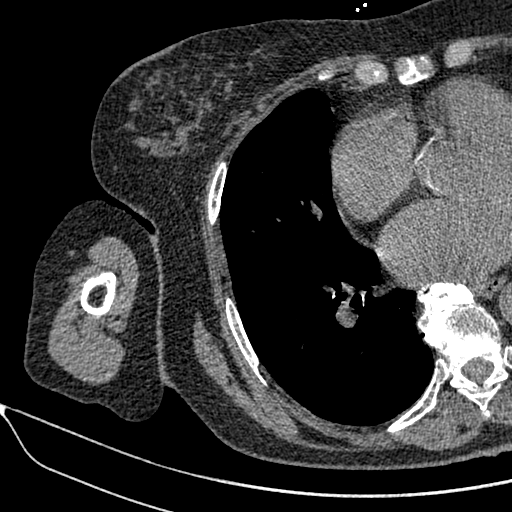
[im 36/357  bone]
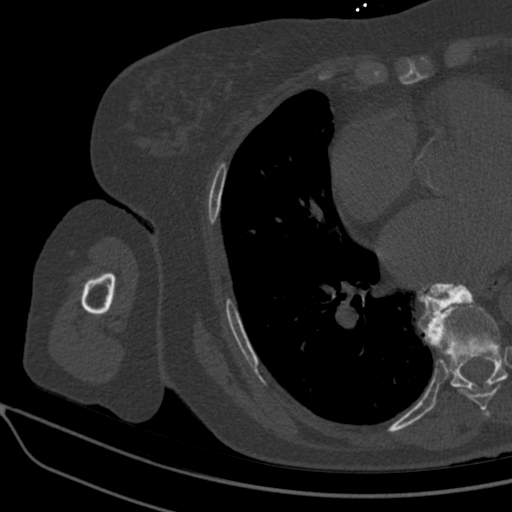
[im 72/357  bone]
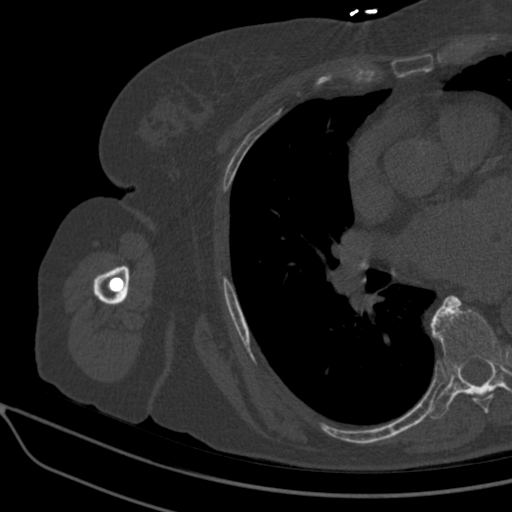
[im 107/357  bone]
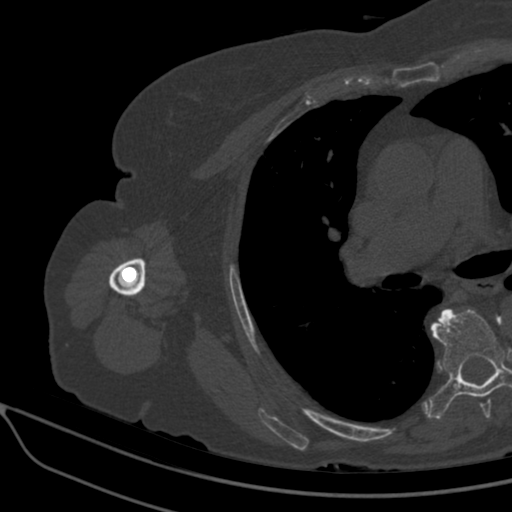
[im 143/357  bone]
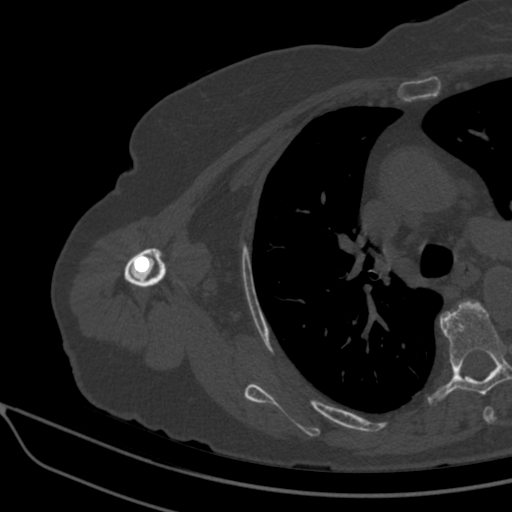
[im 179/357  soft-tissue]
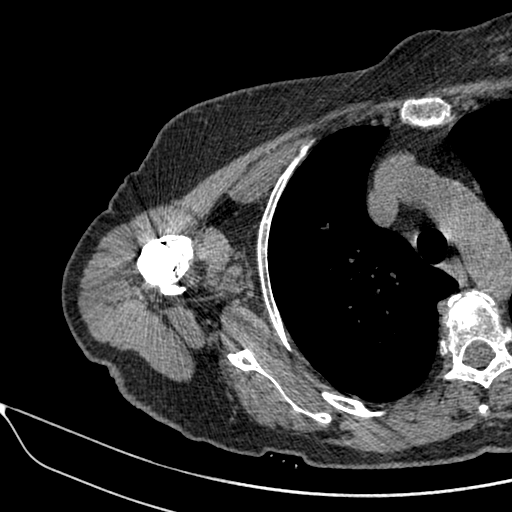
[im 179/357  bone]
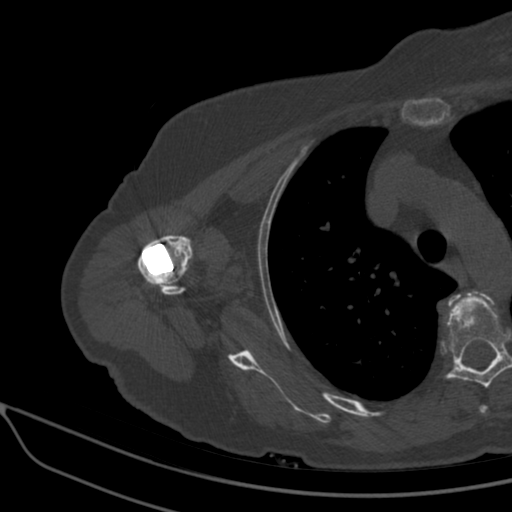
[im 214/357  bone]
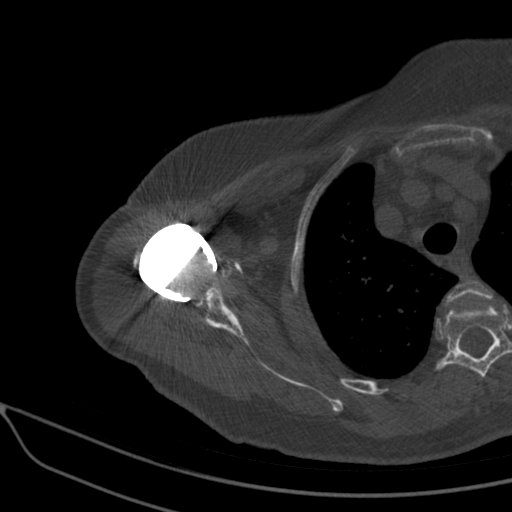
[im 250/357  bone]
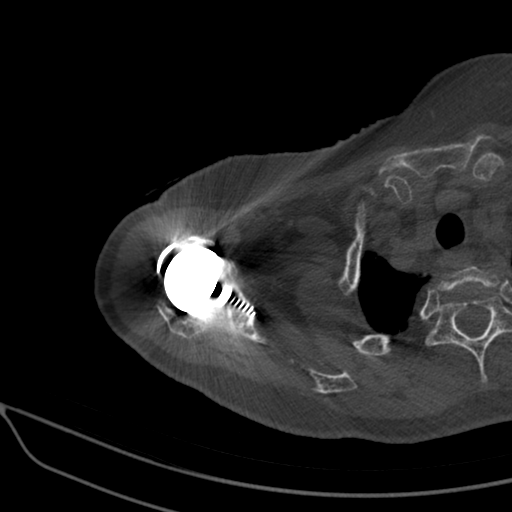
[im 285/357  bone]
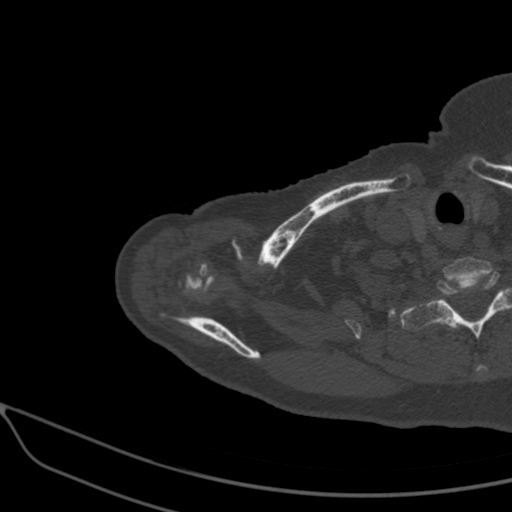
[im 321/357  soft-tissue]
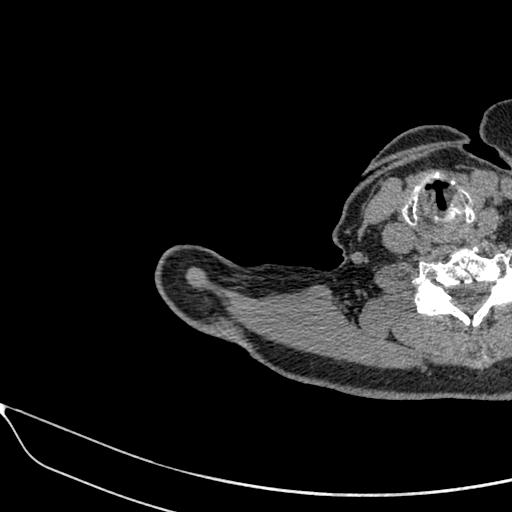
[im 321/357  bone]
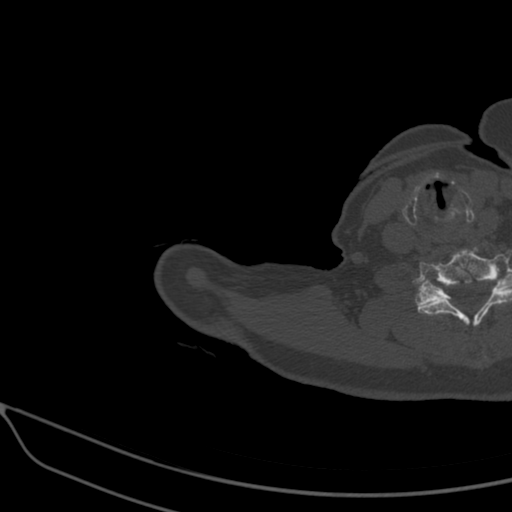

[9 of 14 positions shown; findings below may reference images not displayed]

FINDINGS: Bones/Joint/Cartilage

Postsurgical changes from right reverse shoulder arthroplasty.
Arthroplasty components are in their expected alignment without
dislocation. No periprosthetic lucency or fracture identified.
Multiple well corticated fragments are seen about the shoulder,
largest at the posterosuperior aspect of the shoulder measuring up
to 4.4 cm. No appreciable joint effusion or periprosthetic fluid
collection. No evidence of acromial stress fracture. Mild-moderate
arthropathy of the AC joint. Remaining visualized osseous structures
appear within normal limits.

Ligaments

Suboptimally assessed by CT.

Muscles and Tendons

No apparent musculotendinous abnormality by CT.

Soft tissues

No soft tissue edema or fluid collection. No right axillary
lymphadenopathy. The included portion of the right lung field is
clear. Atherosclerotic calcifications of the aorta and coronary
arteries are evident.
IMPRESSION: 1. Postsurgical changes from right reverse shoulder arthroplasty
without evidence of periprosthetic lucency or fracture.
2. Multiple well corticated fragments about the shoulder, largest at
the posterosuperior aspect of the shoulder measuring up to 4.4 cm.
3. No CT evidence of acromial stress fracture.

Aortic Atherosclerosis ([id number]-RBZ.Z).

## 2023-11-24 ENCOUNTER — Ambulatory Visit (INDEPENDENT_AMBULATORY_CARE_PROVIDER_SITE_OTHER): Payer: Medicare Other | Admitting: Podiatry

## 2023-11-24 DIAGNOSIS — B351 Tinea unguium: Secondary | ICD-10-CM | POA: Diagnosis not present

## 2023-11-24 DIAGNOSIS — M79674 Pain in right toe(s): Secondary | ICD-10-CM | POA: Diagnosis not present

## 2023-11-24 DIAGNOSIS — M79675 Pain in left toe(s): Secondary | ICD-10-CM | POA: Diagnosis not present

## 2023-11-24 NOTE — Progress Notes (Signed)
   Chief Complaint  Patient presents with   [facility]    RM#8 DFC/ NUEROPATHY    SUBJECTIVE Patient with a history of diabetes mellitus presents to office today complaining of elongated, thickened nails that cause pain while ambulating in shoes.  Patient is unable to trim their own nails. Patient is here for further evaluation and treatment.  Past Medical History:  Diagnosis Date   Adenocarcinoma, colon (HCC)    Anemia    iron deficiency   Anxiety    Atrial fibrillation (HCC)    Carpal tunnel syndrome    Cataract    REMOVED BILATERAL   Chronic diarrhea    Colon polyps    Tubular adenoma   Coronary artery disease    Decreased vision    Degenerative disk disease    Dementia (HCC)    Moderate   Depression    Diabetes mellitus, type 2 (HCC)    diet controlled   Dysrhythmia    h/o AFIB   Frequent falls    GERD (gastroesophageal reflux disease)    History of blood transfusion    After surgeries   Hyperlipidemia    Hyperparathyroidism (HCC)    Hypertension    IBS (irritable bowel syndrome)    Kidney disorder    Stage 3b chronic kidney disease   Osteoarthritis    Osteoporosis    Pneumonia    PONV (postoperative nausea and vomiting)    S/P coronary artery stent placement 2007    Allergies  Allergen Reactions   Macrobid [Nitrofurantoin] Diarrhea, Nausea Only and Other (See Comments)    Jaundice Elevated liver enzymes   Augmentin [Amoxicillin-Pot Clavulanate] Swelling    Tongue swelling     OBJECTIVE General Patient is awake, alert, and oriented x 3 and in no acute distress. Derm Skin is dry and supple bilateral. Negative open lesions or macerations. Remaining integument unremarkable. Nails are tender, long, thickened and dystrophic with subungual debris, consistent with onychomycosis, 1-5 bilateral. No signs of infection noted. Vasc  DP and PT pedal pulses palpable bilaterally. Temperature gradient within normal limits.  Neuro Epicritic and protective threshold  sensation diminished bilaterally.  Musculoskeletal Exam No symptomatic pedal deformities noted bilateral. Muscular strength within normal limits.  ASSESSMENT 1. Diabetes Mellitus w/ peripheral neuropathy 2.  Pain due to onychomycosis of toenails bilateral  PLAN OF CARE 1. Patient evaluated today. 2. Instructed to maintain good pedal hygiene and foot care. Stressed importance of controlling blood sugar.  3. Mechanical debridement of nails 1-5 bilaterally performed using a nail nipper. Filed with dremel without incident.  4. Return to clinic in 3 mos.     Felecia Shelling, DPM [facility]  Dr. Felecia Shelling, DPM    [address] [place], [place] [address]                Office [phone]  Fax [phone]

## 2024-02-19 DIAGNOSIS — Q6689 Other  specified congenital deformities of feet: Secondary | ICD-10-CM | POA: Diagnosis not present

## 2024-02-22 ENCOUNTER — Encounter (HOSPITAL_COMMUNITY): Payer: Self-pay

## 2024-02-22 ENCOUNTER — Emergency Department (HOSPITAL_COMMUNITY)
Admission: EM | Admit: 2024-02-22 | Discharge: 2024-02-22 | Disposition: A | Discharge status: Home / Self Care | Attending: Emergency Medicine | Admitting: Emergency Medicine

## 2024-02-22 ENCOUNTER — Emergency Department (HOSPITAL_COMMUNITY)

## 2024-02-22 ENCOUNTER — Other Ambulatory Visit: Payer: Self-pay

## 2024-02-22 DIAGNOSIS — Z7901 Long term (current) use of anticoagulants: Secondary | ICD-10-CM | POA: Diagnosis not present

## 2024-02-22 DIAGNOSIS — N3000 Acute cystitis without hematuria: Secondary | ICD-10-CM | POA: Diagnosis not present

## 2024-02-22 DIAGNOSIS — S06360A Traumatic hemorrhage of cerebrum, unspecified, without loss of consciousness, initial encounter: Secondary | ICD-10-CM | POA: Diagnosis not present

## 2024-02-22 DIAGNOSIS — E1122 Type 2 diabetes mellitus with diabetic chronic kidney disease: Secondary | ICD-10-CM | POA: Insufficient documentation

## 2024-02-22 DIAGNOSIS — I129 Hypertensive chronic kidney disease with stage 1 through stage 4 chronic kidney disease, or unspecified chronic kidney disease: Secondary | ICD-10-CM | POA: Insufficient documentation

## 2024-02-22 DIAGNOSIS — N189 Chronic kidney disease, unspecified: Secondary | ICD-10-CM | POA: Insufficient documentation

## 2024-02-22 DIAGNOSIS — W19XXXA Unspecified fall, initial encounter: Secondary | ICD-10-CM | POA: Diagnosis not present

## 2024-02-22 DIAGNOSIS — M5030 Other cervical disc degeneration, unspecified cervical region: Secondary | ICD-10-CM | POA: Diagnosis not present

## 2024-02-22 DIAGNOSIS — S51811A Laceration without foreign body of right forearm, initial encounter: Secondary | ICD-10-CM | POA: Diagnosis not present

## 2024-02-22 DIAGNOSIS — W01198A Fall on same level from slipping, tripping and stumbling with subsequent striking against other object, initial encounter: Secondary | ICD-10-CM | POA: Diagnosis not present

## 2024-02-22 DIAGNOSIS — F0390 Unspecified dementia without behavioral disturbance: Secondary | ICD-10-CM | POA: Diagnosis not present

## 2024-02-22 DIAGNOSIS — R0902 Hypoxemia: Secondary | ICD-10-CM | POA: Diagnosis not present

## 2024-02-22 DIAGNOSIS — Z743 Need for continuous supervision: Secondary | ICD-10-CM | POA: Diagnosis not present

## 2024-02-22 DIAGNOSIS — R531 Weakness: Secondary | ICD-10-CM | POA: Diagnosis not present

## 2024-02-22 DIAGNOSIS — Z85038 Personal history of other malignant neoplasm of large intestine: Secondary | ICD-10-CM | POA: Insufficient documentation

## 2024-02-22 DIAGNOSIS — M19031 Primary osteoarthritis, right wrist: Secondary | ICD-10-CM | POA: Diagnosis not present

## 2024-02-22 DIAGNOSIS — Z79899 Other long term (current) drug therapy: Secondary | ICD-10-CM | POA: Diagnosis not present

## 2024-02-22 DIAGNOSIS — S0003XA Contusion of scalp, initial encounter: Secondary | ICD-10-CM | POA: Diagnosis not present

## 2024-02-22 DIAGNOSIS — M47816 Spondylosis without myelopathy or radiculopathy, lumbar region: Secondary | ICD-10-CM | POA: Diagnosis not present

## 2024-02-22 DIAGNOSIS — M4802 Spinal stenosis, cervical region: Secondary | ICD-10-CM | POA: Diagnosis not present

## 2024-02-22 DIAGNOSIS — S59911A Unspecified injury of right forearm, initial encounter: Secondary | ICD-10-CM | POA: Diagnosis present

## 2024-02-22 DIAGNOSIS — S0232XA Fracture of orbital floor, left side, initial encounter for closed fracture: Secondary | ICD-10-CM | POA: Diagnosis not present

## 2024-02-22 DIAGNOSIS — Z96611 Presence of right artificial shoulder joint: Secondary | ICD-10-CM | POA: Diagnosis not present

## 2024-02-22 DIAGNOSIS — R404 Transient alteration of awareness: Secondary | ICD-10-CM | POA: Diagnosis not present

## 2024-02-22 DIAGNOSIS — R609 Edema, unspecified: Secondary | ICD-10-CM | POA: Diagnosis not present

## 2024-02-22 DIAGNOSIS — I10 Essential (primary) hypertension: Secondary | ICD-10-CM | POA: Diagnosis not present

## 2024-02-22 DIAGNOSIS — R58 Hemorrhage, not elsewhere classified: Secondary | ICD-10-CM | POA: Diagnosis not present

## 2024-02-22 DIAGNOSIS — M79631 Pain in right forearm: Secondary | ICD-10-CM | POA: Diagnosis not present

## 2024-02-22 DIAGNOSIS — R0989 Other specified symptoms and signs involving the circulatory and respiratory systems: Secondary | ICD-10-CM | POA: Diagnosis not present

## 2024-02-22 DIAGNOSIS — M160 Bilateral primary osteoarthritis of hip: Secondary | ICD-10-CM | POA: Diagnosis not present

## 2024-02-22 DIAGNOSIS — S0083XA Contusion of other part of head, initial encounter: Secondary | ICD-10-CM | POA: Diagnosis not present

## 2024-02-22 DIAGNOSIS — Z043 Encounter for examination and observation following other accident: Secondary | ICD-10-CM | POA: Diagnosis not present

## 2024-02-22 DIAGNOSIS — S0093XA Contusion of unspecified part of head, initial encounter: Secondary | ICD-10-CM

## 2024-02-22 LAB — CBC
HCT: 42.8 % (ref 36.0–46.0)
Hemoglobin: 14.1 g/dL (ref 12.0–15.0)
MCH: 31.4 pg (ref 26.0–34.0)
MCHC: 32.9 g/dL (ref 30.0–36.0)
MCV: 95.3 fL (ref 80.0–100.0)
Platelets: 183 10*3/uL (ref 150–400)
RBC: 4.49 MIL/uL (ref 3.87–5.11)
RDW: 12.4 % (ref 11.5–15.5)
WBC: 12.8 10*3/uL — ABNORMAL HIGH (ref 4.0–10.5)
nRBC: 0 % (ref 0.0–0.2)

## 2024-02-22 LAB — COMPREHENSIVE METABOLIC PANEL WITH GFR
ALT: 22 U/L (ref 0–44)
AST: 22 U/L (ref 15–41)
Albumin: 3.4 g/dL — ABNORMAL LOW (ref 3.5–5.0)
Alkaline Phosphatase: 47 U/L (ref 38–126)
Anion gap: 10 (ref 5–15)
BUN: 34 mg/dL — ABNORMAL HIGH (ref 8–23)
CO2: 24 mmol/L (ref 22–32)
Calcium: 8.6 mg/dL — ABNORMAL LOW (ref 8.9–10.3)
Chloride: 105 mmol/L (ref 98–111)
Creatinine, Ser: 1.42 mg/dL — ABNORMAL HIGH (ref 0.44–1.00)
GFR, Estimated: 37 mL/min — ABNORMAL LOW (ref >60)
Glucose, Bld: 178 mg/dL — ABNORMAL HIGH (ref 70–99)
Potassium: 3.7 mmol/L (ref 3.5–5.1)
Sodium: 139 mmol/L (ref 135–145)
Total Bilirubin: 0.8 mg/dL (ref 0.0–1.2)
Total Protein: 6.1 g/dL — ABNORMAL LOW (ref 6.5–8.1)

## 2024-02-22 LAB — URINALYSIS, ROUTINE W REFLEX MICROSCOPIC
Bilirubin Urine: NEGATIVE
Glucose, UA: NEGATIVE mg/dL
Hgb urine dipstick: NEGATIVE
Ketones, ur: NEGATIVE mg/dL
Nitrite: POSITIVE — AB
Protein, ur: 100 mg/dL — AB
Specific Gravity, Urine: 1.016 (ref 1.005–1.030)
pH: 5 (ref 5.0–8.0)

## 2024-02-22 LAB — VALPROIC ACID LEVEL: Valproic Acid Lvl: 14 ug/mL — ABNORMAL LOW (ref 50–100)

## 2024-02-22 LAB — CBG MONITORING, ED: Glucose-Capillary: 195 mg/dL — ABNORMAL HIGH (ref 70–99)

## 2024-02-22 LAB — CK: Total CK: 61 U/L (ref 38–234)

## 2024-02-22 MED ORDER — ACETAMINOPHEN 500 MG PO TABS
1000.0000 mg | ORAL_TABLET | Freq: Once | ORAL | Status: AC
  Administered 2024-02-22: 1000 mg via ORAL
  Filled 2024-02-22: qty 2

## 2024-02-22 MED ORDER — CEPHALEXIN 500 MG PO CAPS
500.0000 mg | ORAL_CAPSULE | Freq: Three times a day (TID) | ORAL | 0 refills | Status: AC
Start: 2024-02-22 — End: ?

## 2024-02-22 MED ORDER — OLANZAPINE 2.5 MG PO TABS
2.5000 mg | ORAL_TABLET | Freq: Once | ORAL | Status: AC
  Administered 2024-02-22: 2.5 mg via ORAL
  Filled 2024-02-22: qty 1

## 2024-02-22 MED ORDER — DIVALPROEX SODIUM 125 MG PO CSDR
250.0000 mg | DELAYED_RELEASE_CAPSULE | Freq: Once | ORAL | Status: AC
  Administered 2024-02-22: 250 mg via ORAL
  Filled 2024-02-22: qty 2

## 2024-02-22 MED ORDER — SODIUM CHLORIDE 0.9 % IV SOLN
1.0000 g | Freq: Once | INTRAVENOUS | Status: AC
  Administered 2024-02-22: 1 g via INTRAVENOUS
  Filled 2024-02-22: qty 10

## 2024-02-22 MED ORDER — LORAZEPAM 2 MG/ML IJ SOLN
0.5000 mg | Freq: Once | INTRAMUSCULAR | Status: AC
  Administered 2024-02-22: 0.5 mg via INTRAVENOUS
  Filled 2024-02-22: qty 1

## 2024-02-22 NOTE — ED Notes (Signed)
 Trauma Response Nurse Documentation  Tonya [name] is a 83 y.o. female arriving to [facility] ED via EMS  On Eliquis  (apixaban ) daily. Trauma was activated as a Level 2 based on the following trauma criteria Elderly patients > 65 with head trauma on anti-coagulation (excluding ASA).  Patient cleared for CT by Dr. Scarlette Currier. Pt transported to CT with trauma response nurse present to monitor. RN remained with the patient throughout their absence from the department for clinical observation.   GCS 14, baseline per family.  History   Past Medical History:  Diagnosis Date   Adenocarcinoma, colon (HCC)    Anemia    iron deficiency   Anxiety    Atrial fibrillation (HCC)    Carpal tunnel syndrome    Cataract    REMOVED BILATERAL   Chronic diarrhea    Colon polyps    Tubular adenoma   Coronary artery disease    Decreased vision    Degenerative disk disease    Dementia (HCC)    Moderate   Depression    Diabetes mellitus, type 2 (HCC)    diet controlled   Dysrhythmia    h/o AFIB   Frequent falls    GERD (gastroesophageal reflux disease)    History of blood transfusion    After surgeries   Hyperlipidemia    Hyperparathyroidism (HCC)    Hypertension    IBS (irritable bowel syndrome)    Kidney disorder    Stage 3b chronic kidney disease   Osteoarthritis    Osteoporosis    Pneumonia    PONV (postoperative nausea and vomiting)    S/P coronary artery stent placement 2007     Past Surgical History:  Procedure Laterality Date   CARDIAC CATHETERIZATION  08/2009   LAD stent patent. Insignificant CAD, otherwise EF 60-65%   CARPAL TUNNEL RELEASE     CATARACT EXTRACTION Bilateral 03/2015   CHOLECYSTECTOMY     COLON SURGERY     COLONOSCOPY     DILATION AND CURETTAGE OF UTERUS     HEMATOMA EVACUATION     after knee replacement   KNEE ARTHROSCOPY     bilateral   LUMBAR LAMINECTOMY N/A 11/26/2015   Procedure: LUMBAR DECOMPRESSIVE LAMINECTOMY L3-4 AND L4-5, LEFT L2-3  HEMILAMINECTOMY;  Surgeon: Alphonso Jean, MD;  Location: [facility] OR;  Service: Orthopedics;  Laterality: N/A;   ORIF ELBOW FRACTURE Left 03/18/2021   Procedure: OPEN REDUCTION INTERNAL FIXATION (ORIF) ELBOW/OLECRANON FRACTURE;  Surgeon: Saundra Curl, MD;  Location: [facility] ORS;  Service: Orthopedics;  Laterality: Left;   ORIF ELBOW FRACTURE Left 07/28/2022   Procedure: OPEN REDUCTION INTERNAL FIXATION (ORIF) ELBOW/OLECRANON FRACTURE with screws AND PIN REMOVAL;  Surgeon: Saundra Curl, MD;  Location: [facility] ORS;  Service: Orthopedics;  Laterality: Left;  Block   PARTIAL COLECTOMY  11/2008   right, for adenocarcinoma   REPLACEMENT TOTAL KNEE  04/2010   right, complicated by hemarthrosis    REVERSE SHOULDER ARTHROPLASTY Right 05/28/2016   Procedure: REVERSE SHOULDER ARTHROPLASTY;  Surgeon: Jasmine Mesi, MD;  Location: [facility] OR;  Service: Orthopedics;  Laterality: Right;   TONSILLECTOMY     TOTAL KNEE ARTHROPLASTY Left 04/03/2014   Procedure: TOTAL KNEE ARTHROPLASTY;  Surgeon: Shirlee Dotter, MD;  Location: [facility] OR;  Service: Orthopedics;  Laterality: Left;   TOTAL KNEE REVISION  03/08/2012   Procedure: TOTAL KNEE REVISION;  Surgeon: Shirlee Dotter, MD;  [facility] OR;  Service: Orthopedics;  Laterality: Right;  removal total knee hardware and placement  of antibiotic cement spacer and antibiotic beads   TOTAL KNEE REVISION  05/17/2012   Procedure: TOTAL KNEE REVISION;  Surgeon: Shirlee Dotter, MD;  Location: [facility] OR;  Service: Orthopedics;  Laterality: Right;  right total knee revision, removal of antibiotic spacer   UPPER GI ENDOSCOPY       Initial Focused Assessment (If applicable, or please see trauma documentation): Patient alert, baseline dementia, GCS 14, PERR 3 Airway intact, bilateral breath sounds Pulses 2+  CT's Completed:   CT Head and CT C-Spine   Interventions:  Labs, urinalysis CXR/PXR/R forearm CT Head/Cspine  Plan for disposition:  Discharge home   Event  Summary: Patient to ED after an unwitnessed fall causing laceration to right arm and hematoma to head. Patient takes Eliquis . Imaging was ordered and revealed no traumatic injury. Patient with likely UTI causing AMS, EDP will start antibiotics. Patient stable for discharge back to [facility] via [place].  Bedside handoff with ED RN [place].    Henri Loft Shereese Bonnie  Trauma Response RN  Please call TRN at [phone] for further assistance.

## 2024-02-22 NOTE — Progress Notes (Signed)
 Orthopedic Tech Progress Note Patient Details:  Tonya [name] 1940-10-17 [id number]  Level 2 trauma   Patient ID: Tonya [name], female   DOB: Mar 28, 1941, 83 y.o.   MRN: [id number]  Tonya [name] 02/22/2024, 11:07 AM

## 2024-02-22 NOTE — ED Notes (Signed)
 [facility] arrived, pt alert and awake. Vital signs stable.

## 2024-02-22 NOTE — Discharge Instructions (Addendum)
 Hold Eliquis  dose tonight.  Your Depakote  level was low, please start taking your medication as prescribed and follow-up with your primary doctor.

## 2024-02-22 NOTE — ED Provider Notes (Addendum)
 [facility] AT [facility] Provider Note   CSN: [id number] Arrival date & time: 02/22/24  1105     History  No chief complaint on file.   Tonya [name] is a 83 y.o. female.  Pt is a 83 yo female with pmhx significant for dementia, hld, htn, colon cancer, gerd, depression, anxiety, IBS, dm2, cad s/p stent placement, afib (on Eliquis ) and ckd.  Pt presents to the ED today after a fall.  She did hit her head, but no other injuries noted.  Pt is unable to tell us  what happened due to the dementia.  It is unclear how long she was on the ground.  Family feels that she's been more altered for about a day or 2 (prior to fall)       Home Medications Prior to Admission medications   Medication Sig Start Date End Date Taking? Authorizing Provider  acetaminophen  (TYLENOL ) 325 MG tablet Take 2 tablets (650 mg total) by mouth every 6 (six) hours as needed for mild pain or moderate pain. 07/30/22  Yes Gawne, Meghan M, PA-C  atorvastatin  (LIPITOR) 40 MG tablet Take 40 mg by mouth daily. 02/18/24  Yes [provider]  baclofen  (LIORESAL ) 10 MG tablet Take 10 mg by mouth 2 (two) times daily as needed for muscle spasms. 02/07/24  Yes [provider]  Calcium  Carb-Cholecalciferol (CALCIUM  + VITAMIN D3) 600-5 MG-MCG TABS Take 1 tablet by mouth daily.   Yes [provider]  carvedilol  (COREG ) 12.5 MG tablet TAKE 1 TABLET (12.5MG  TOTAL) BY MOUTH TWICE A DAY WITH MEALS Patient taking differently: Take 12.5 mg by mouth 2 (two) times daily with a meal. 11/02/21  Yes Bedsole, Amy E, MD  cetirizine (ZYRTEC) 10 MG tablet Take 10 mg by mouth at bedtime.   Yes [provider]  cholestyramine  light (PREVALITE ) 4 g packet USE 1 PACKET TWICE DAILY WITH A MEAL, AT LEAST 2 HOURS AWAY FROM OTHER MEDS PATIENT NEEDS OFFICE VISIT Patient taking differently: Take 4 g by mouth 2 (two) times daily. Give at least two hours away from other meds 04/20/22  Yes Janel Medford, MD  citalopram  (CELEXA ) 40 MG tablet TAKE 1 TABLET BY MOUTH EVERY DAY Patient taking differently: Take 40 mg by mouth daily. 07/16/22  Yes Bedsole, Amy E, MD  diclofenac  Sodium (VOLTAREN  ARTHRITIS PAIN) 1 % GEL Apply 2 g topically 2 (two) times daily. To neck   Yes [provider]  diltiazem  (CARDIZEM  CD) 180 MG 24 hr capsule TAKE 1 CAPSULE BY MOUTH EVERY DAY Patient taking differently: Take 180 mg by mouth daily. 11/02/21  Yes Bedsole, Amy E, MD  divalproex  (DEPAKOTE  SPRINKLE) 125 MG capsule Take 2 capsules (250 mg total) by mouth 3 (three) times daily. Patient taking differently: Take 125 mg by mouth 3 (three) times daily. 05/04/23 04/28/24 Yes Camara, Amadou, MD  ELIQUIS  2.5 MG TABS tablet TAKE 1 TABLET BY MOUTH TWICE A DAY Patient taking differently: Take 2.5 mg by mouth 2 (two) times daily. 11/02/21  Yes Bedsole, Amy E, MD  ferrous sulfate  325 (65 FE) MG EC tablet Take 325 mg by mouth 2 (two) times daily.   Yes Bedsole, Amy E, MD  furosemide  (LASIX ) 20 MG tablet Take 20 mg by mouth daily. 06/24/23  Yes [provider]  ipratropium-albuterol (DUONEB) 0.5-2.5 (3) MG/3ML SOLN Take 3 mLs by nebulization every 6 (six) hours as needed (shortness of breath).   Yes [provider]  isosorbide  mononitrate (  IMDUR ) 30 MG 24 hr tablet TAKE 1 TABLET BY MOUTH EVERY DAY Patient taking differently: Take 30 mg by mouth daily. 11/03/21  Yes Eilleen Grates, MD  magnesium  oxide (MAG-OX) 400 (240 Mg) MG tablet Take 400 mg by mouth daily.   Yes [provider]  melatonin 3 MG TABS tablet Take 3 mg by mouth at bedtime.   Yes [provider]  Menthol , Topical Analgesic, (BIOFREEZE COOL THE PAIN) 4 % GEL Apply 1 Application topically 3 (three) times daily. To both shoulders   Yes [provider]  OLANZapine  (ZYPREXA ) 2.5 MG tablet Take 2.5 mg by mouth at bedtime. 02/18/24  Yes [provider]  omeprazole  (PRILOSEC) 20 MG capsule TAKE 1 CAPSULE BY MOUTH  EVERY DAY Patient taking differently: Take 20 mg by mouth daily. 11/02/21  Yes Bedsole, Amy E, MD  ondansetron  (ZOFRAN -ODT) 4 MG disintegrating tablet Take 1 tablet (4 mg total) by mouth every 8 (eight) hours as needed. Patient taking differently: Take 4 mg by mouth every 8 (eight) hours as needed for nausea, vomiting or refractory nausea / vomiting. 12/23/21  Yes Arline Bennett, MD  potassium chloride  (MICRO-K ) 10 MEQ CR capsule Take 10 mEq by mouth daily.   Yes [provider]  traMADol  (ULTRAM ) 50 MG tablet Take 50 mg by mouth daily as needed. 01/25/24  Yes [provider]  Vitamin D , Ergocalciferol , (DRISDOL ) 1.25 MG (50000 UNIT) CAPS capsule TAKE 1 CAPSULE (50,000 UNITS TOTAL) BY MOUTH EVERY 7 (SEVEN) DAYS Patient taking differently: Take 50,000 Units by mouth every Friday. 04/29/22  Yes Bedsole, Amy E, MD  losartan  (COZAAR ) 100 MG tablet TAKE 1 TABLET BY MOUTH EVERY DAY Patient not taking: Reported on 02/22/2024 04/06/22   Eilleen Grates, MD      Allergies    Macrobid [nitrofurantoin] and Augmentin [amoxicillin -pot clavulanate]    Review of Systems   Review of Systems  All other systems reviewed and are negative.   Physical Exam Updated Vital Signs BP (!) 153/88   Pulse 91   Temp 97.8 F (36.6 C) (Oral)   Resp (!) 21   Ht 5\' 5"  (1.651 m)   Wt 72.6 kg   SpO2 97%   BMI 26.63 kg/m  Physical Exam Vitals and nursing note reviewed.  Constitutional:      Appearance: Normal appearance.  HENT:     Head: Normocephalic and atraumatic.     Comments: Hematoma to forehead    Right Ear: External ear normal.     Left Ear: External ear normal.     Nose: Nose normal.     Mouth/Throat:     Mouth: Mucous membranes are moist.     Pharynx: Oropharynx is clear.  Eyes:     Extraocular Movements: Extraocular movements intact.     Conjunctiva/sclera: Conjunctivae normal.     Pupils: Pupils are equal, round, and reactive to light.  Neck:     Comments: In  c-collar Cardiovascular:     Rate and Rhythm: Normal rate and regular rhythm.     Pulses: Normal pulses.     Heart sounds: Normal heart sounds.  Pulmonary:     Effort: Pulmonary effort is normal.     Breath sounds: Normal breath sounds.  Abdominal:     General: Abdomen is flat. Bowel sounds are normal.     Palpations: Abdomen is soft.  Musculoskeletal:        General: Normal range of motion.  Skin:    Capillary Refill: Capillary refill takes less  than 2 seconds.     Comments: Skin tear right forearm  Neurological:     General: No focal deficit present.     Mental Status: She is alert. Mental status is at baseline.  Psychiatric:        Mood and Affect: Mood normal.     ED Results / Procedures / Treatments   Labs (all labs ordered are listed, but only abnormal results are displayed) Labs Reviewed  CBC - Abnormal; Notable for the following components:      Result Value   WBC 12.8 (*)    All other components within normal limits  CBG MONITORING, ED - Abnormal; Notable for the following components:   Glucose-Capillary 195 (*)    All other components within normal limits  URINALYSIS, ROUTINE W REFLEX MICROSCOPIC  CK  COMPREHENSIVE METABOLIC PANEL WITH GFR  VALPROIC ACID LEVEL    EKG EKG Interpretation Date/Time:  Tuesday Feb 22 2024 11:40:17 EDT Ventricular Rate:  83 PR Interval:    QRS Duration:  92 QT Interval:  440 QTC Calculation: 518 R Axis:   112  Text Interpretation: Atrial fibrillation Probable lateral infarct, age indeterminate Probable anteroseptal infarct, old Prolonged QT interval No significant change since last tracing Confirmed by Sueellen Emery ([id number]) on 02/22/2024 11:53:38 AM  Radiology DG Chest Port 1 View Result Date: 02/22/2024 CLINICAL DATA:  Fall. EXAM: PORTABLE CHEST 1 VIEW COMPARISON:  06/14/2023. FINDINGS: Low lung volumes. The heart size and mediastinal contours are unchanged. Similar minimal left basilar atelectasis/scarring. No focal  consolidation, sizeable pleural effusion, or pneumothorax. Prior right reverse shoulder arthroplasty. No acute osseous abnormality. IMPRESSION: Low lung volumes. Similar minimal left basilar atelectasis/scarring. Otherwise, no acute findings the chest. Electronically Signed   By: Mannie Seek M.D.   On: 02/22/2024 14:22   DG Forearm Right Result Date: 02/22/2024 CLINICAL DATA:  Pain after fall. EXAM: RIGHT FOREARM - 2 VIEW COMPARISON:  Right wrist radiographs dated 07/17/2022. FINDINGS: Diffuse osseous demineralization. No acute fracture or dislocation. Advanced radiocarpal joint space narrowing with chondrocalcinosis is again noted. No radiopaque foreign body. IMPRESSION: 1. No acute osseous abnormality. 2. Advanced arthritic changes of the radiocarpal joint chondrocalcinosis. Electronically Signed   By: Mannie Seek M.D.   On: 02/22/2024 14:20   DG Pelvis Portable Result Date: 02/22/2024 CLINICAL DATA:  Fall. EXAM: PORTABLE PELVIS 1-2 VIEWS COMPARISON:  06/14/2023. FINDINGS: There is no evidence of acute pelvic fracture or diastasis. Femoral heads are seated within the acetabula. Sacroiliac joints and pubic symphysis are anatomically aligned. Mild degenerative changes of the bilateral hips. Degenerative changes of the visualized lower lumbar spine. IMPRESSION: No acute osseous abnormality. Electronically Signed   By: Mannie Seek M.D.   On: 02/22/2024 14:17   CT CERVICAL SPINE WO CONTRAST Result Date: 02/22/2024 CLINICAL DATA:  83 year old female status post unwitnessed fall, found down this morning. EXAM: CT CERVICAL SPINE WITHOUT CONTRAST TECHNIQUE: Multidetector CT imaging of the cervical spine was performed without intravenous contrast. Multiplanar CT image reconstructions were also generated. RADIATION DOSE REDUCTION: This exam was performed according to the departmental dose-optimization program which includes automated exposure control, adjustment of the mA and/or kV according to  patient size and/or use of iterative reconstruction technique. COMPARISON:  Head CT today.  Cervical spine CT 06/14/2023. FINDINGS: Alignment: Increase straightening of cervical lordosis since last year. Maintained cervicothoracic junction alignment, posterior element hypertrophy. Skull base and vertebrae: Stable bone mineralization. Visualized skull base is intact. No atlanto-occipital dissociation. C1 and C2 are severely degenerated in the  midline and to the left, stable and intact. No acute osseous abnormality identified. Soft tissues and spinal canal: No prevertebral fluid or swelling. No visible canal hematoma. Partially retropharyngeal course of both carotids, normal variant. Negative visible noncontrast neck soft tissues. Disc levels: Chronic severe cervical spine degeneration, bulky from C1-C2 through C4-C5. Multifactorial chronic spinal stenosis is associated. CT appearance stable from last year. Upper chest: Visible upper thoracic levels appear intact. Negative lung apices. IMPRESSION: 1. No acute traumatic injury identified in the cervical spine. 2. Chronic severe cervical spine degeneration with associated spinal stenosis. Electronically Signed   By: Marlise Simpers M.D.   On: 02/22/2024 12:05   CT HEAD WO CONTRAST Result Date: 02/22/2024 CLINICAL DATA:  83 year old female status post unwitnessed fall, found down this morning. EXAM: CT HEAD WITHOUT CONTRAST TECHNIQUE: Contiguous axial images were obtained from the base of the skull through the vertex without intravenous contrast. RADIATION DOSE REDUCTION: This exam was performed according to the departmental dose-optimization program which includes automated exposure control, adjustment of the mA and/or kV according to patient size and/or use of iterative reconstruction technique. COMPARISON:  Head CT 06/14/2023. FINDINGS: Brain: Stable cerebral volume. No midline shift, ventriculomegaly, mass effect, evidence of mass lesion, intracranial hemorrhage or  evidence of cortically based acute infarction. Stable patchy periventricular white matter hypodensity bilaterally. Vascular: No suspicious intracranial vascular hyperdensity. Calcified atherosclerosis at the skull base. Skull: Chronic left orbital floor fracture is stable. No acute osseous abnormality identified. Sinuses/Orbits: Visualized paranasal sinuses and mastoids are stable and well aerated. Other: Broad-based right scalp hematoma along the anterior convexity, up to 16 mm in thickness. Underlying calvarium appears stable and intact. No scalp soft tissue gas. Stable orbits soft tissues. IMPRESSION: 1. Right anterior convexity scalp hematoma. No underlying skull fracture. 2. No acute intracranial abnormality. Stable chronic white matter changes. Electronically Signed   By: Marlise Simpers M.D.   On: 02/22/2024 12:02    Procedures Procedures    Medications Ordered in ED Medications  LORazepam  (ATIVAN ) injection 0.5 mg (0.5 mg Intravenous Given 02/22/24 1435)  acetaminophen  (TYLENOL ) tablet 1,000 mg (1,000 mg Oral Given 02/22/24 1502)    ED Course/ Medical Decision Making/ A&P                                 Medical Decision Making Amount and/or Complexity of Data Reviewed Labs: ordered. Radiology: ordered.  Risk OTC drugs. Prescription drug management.   This patient presents to the ED for concern of fall, this involves an extensive number of treatment options, and is a complaint that carries with it a high risk of complications and morbidity.  The differential diagnosis includes multiple trauma   Co morbidities that complicate the patient evaluation  dementia, hld, htn, colon cancer, gerd, depression, anxiety, IBS, dm2, cad s/p stent placement, afib (on Eliquis ) and ck   Additional history obtained:  Additional history obtained from epic chart review External records from outside source obtained and reviewed including EMS report   Lab Tests:  I Ordered, and personally  interpreted labs.  The pertinent results include:  cbc with wbc sl elevated at 12.8, ua + for uti   Imaging Studies ordered:  I ordered imaging studies including ct head/c-spine; cxr, forearm, pelvis I independently visualized and interpreted imaging which showed  CT head: . Right anterior convexity scalp hematoma. No underlying skull  fracture.  2. No acute intracranial abnormality. Stable chronic white matter  changes.  CT c-spine:  No acute traumatic injury identified in the cervical spine.  2. Chronic severe cervical spine degeneration with associated spinal  stenosis.  CXR: Low lung volumes. Similar minimal left basilar atelectasis/scarring.  Otherwise, no acute findings the chest.  R forearm: No acute osseous abnormality.  2. Advanced arthritic changes of the radiocarpal joint  chondrocalcinosis.  Pelvis: No acute osseous abnormality.  I agree with the radiologist interpretation   Cardiac Monitoring:  The patient was maintained on a cardiac monitor.  I personally viewed and interpreted the cardiac monitored which showed an underlying rhythm of: afib   Medicines ordered and prescription drug management:  I ordered medication including ativan /tylenol   for sx  Reevaluation of the patient after these medicines showed that the patient improved I have reviewed the patients home medicines and have made adjustments as needed   Test Considered:  ct   Critical Interventions:  Level 2 trauma   Problem List / ED Course:  Fall:  no internal injuries.  Pt has a hematoma to right forehead and a skin tear to the right forearm. AMS:  likely from urine. Pt started on rocephin  and d/c with keflex.     Reevaluation:  After the interventions noted above, I reevaluated the patient and found that they have :improved   Social Determinants of Health:  Lives in facility   Dispostion:  After consideration of the diagnostic results and the patients response to treatment,  I feel that the patent would benefit from discharge with outpatient f/u.          Final Clinical Impression(s) / ED Diagnoses Final diagnoses:  Anticoagulated on apixaban   Traumatic cephalohematoma, initial encounter  Skin tear of right forearm without complication, initial encounter    Rx / DC Orders ED Discharge Orders     None         Sueellen Emery, MD 02/22/24 1538    Sueellen Emery, MD 02/22/24 1549

## 2024-02-22 NOTE — ED Notes (Signed)
 Patient transported to X-ray

## 2024-02-22 NOTE — ED Triage Notes (Signed)
 PT BIB [facility] from [facility] after unwitnessed fall, staff found PT this morning 0920, with R arm laceration and skin tear and hematoma above R eye. PT baseline Aox2, dementia. On eliquis . [facility] VS:  BP 118/80, O2 96% RA, CBG 247, HR 82

## 2024-02-23 DIAGNOSIS — E119 Type 2 diabetes mellitus without complications: Secondary | ICD-10-CM | POA: Diagnosis not present

## 2024-02-23 DIAGNOSIS — I10 Essential (primary) hypertension: Secondary | ICD-10-CM | POA: Diagnosis not present

## 2024-03-01 DIAGNOSIS — K219 Gastro-esophageal reflux disease without esophagitis: Secondary | ICD-10-CM | POA: Diagnosis not present

## 2024-03-01 DIAGNOSIS — E119 Type 2 diabetes mellitus without complications: Secondary | ICD-10-CM | POA: Diagnosis not present

## 2024-03-01 DIAGNOSIS — K589 Irritable bowel syndrome without diarrhea: Secondary | ICD-10-CM | POA: Diagnosis not present

## 2024-03-01 DIAGNOSIS — I10 Essential (primary) hypertension: Secondary | ICD-10-CM | POA: Diagnosis not present

## 2024-03-01 DIAGNOSIS — M6281 Muscle weakness (generalized): Secondary | ICD-10-CM | POA: Diagnosis not present

## 2024-03-01 DIAGNOSIS — R296 Repeated falls: Secondary | ICD-10-CM | POA: Diagnosis not present

## 2024-03-03 DIAGNOSIS — M25552 Pain in left hip: Secondary | ICD-10-CM | POA: Diagnosis not present

## 2024-03-14 DIAGNOSIS — R278 Other lack of coordination: Secondary | ICD-10-CM | POA: Diagnosis not present

## 2024-03-15 DIAGNOSIS — R278 Other lack of coordination: Secondary | ICD-10-CM | POA: Diagnosis not present

## 2024-03-16 DIAGNOSIS — R278 Other lack of coordination: Secondary | ICD-10-CM | POA: Diagnosis not present

## 2024-03-17 DIAGNOSIS — R278 Other lack of coordination: Secondary | ICD-10-CM | POA: Diagnosis not present

## 2024-03-20 DIAGNOSIS — R278 Other lack of coordination: Secondary | ICD-10-CM | POA: Diagnosis not present

## 2024-03-21 DIAGNOSIS — R278 Other lack of coordination: Secondary | ICD-10-CM | POA: Diagnosis not present

## 2024-03-22 DIAGNOSIS — R278 Other lack of coordination: Secondary | ICD-10-CM | POA: Diagnosis not present

## 2024-03-23 DIAGNOSIS — R278 Other lack of coordination: Secondary | ICD-10-CM | POA: Diagnosis not present

## 2024-03-24 DIAGNOSIS — R278 Other lack of coordination: Secondary | ICD-10-CM | POA: Diagnosis not present

## 2024-03-27 DIAGNOSIS — R278 Other lack of coordination: Secondary | ICD-10-CM | POA: Diagnosis not present

## 2024-05-01 ENCOUNTER — Telehealth: Payer: Self-pay | Admitting: Neurology

## 2024-05-01 NOTE — Telephone Encounter (Signed)
 Pt Daughter called to cancel Pt appt due to not feeling the need for her to come .  Appt canceled

## 2024-05-08 ENCOUNTER — Ambulatory Visit: Payer: Medicare Other | Admitting: Neurology

## 2024-06-29 ENCOUNTER — Other Ambulatory Visit (INDEPENDENT_AMBULATORY_CARE_PROVIDER_SITE_OTHER): Payer: Self-pay | Admitting: Nurse Practitioner

## 2024-06-29 DIAGNOSIS — R6889 Other general symptoms and signs: Secondary | ICD-10-CM

## 2024-06-30 ENCOUNTER — Encounter (INDEPENDENT_AMBULATORY_CARE_PROVIDER_SITE_OTHER): Payer: Self-pay | Admitting: Nurse Practitioner

## 2024-06-30 ENCOUNTER — Ambulatory Visit (INDEPENDENT_AMBULATORY_CARE_PROVIDER_SITE_OTHER): Admitting: Nurse Practitioner

## 2024-06-30 ENCOUNTER — Ambulatory Visit (INDEPENDENT_AMBULATORY_CARE_PROVIDER_SITE_OTHER)

## 2024-06-30 VITALS — BP 117/75 | HR 82 | Ht 65.0 in | Wt 160.0 lb

## 2024-06-30 DIAGNOSIS — E785 Hyperlipidemia, unspecified: Secondary | ICD-10-CM

## 2024-06-30 DIAGNOSIS — E1169 Type 2 diabetes mellitus with other specified complication: Secondary | ICD-10-CM

## 2024-06-30 DIAGNOSIS — R6889 Other general symptoms and signs: Secondary | ICD-10-CM | POA: Diagnosis not present

## 2024-07-02 ENCOUNTER — Encounter (INDEPENDENT_AMBULATORY_CARE_PROVIDER_SITE_OTHER): Payer: Self-pay | Admitting: Nurse Practitioner

## 2024-07-02 NOTE — Progress Notes (Signed)
 Subjective:    Patient ID: Tonya [name], female    DOB: 1941/05/23, 83 y.o.   MRN: [id number] No chief complaint on file.   The patient presents today for evaluation of an abnormal ABI that was done at her nursing facility.  Unfortunately we do not have those results including her referral.  The patient has dementia so she is not able to quantify whether she has any claudication pain but her family member that is with her notes that she does not have any open wounds or ulcerations or any complaints that may be consistent with rest pain.  Today noninvasive study showed an ABI of 1.25 on the left and 1.26 on the right.  She has strong triphasic tibial artery waveforms bilaterally with good toe waveforms bilaterally.    Review of Systems  Cardiovascular:  Positive for leg swelling.  Neurological:  Positive for weakness.  Psychiatric/Behavioral:  Positive for confusion.   All other systems reviewed and are negative.      Objective:   Physical Exam Vitals reviewed.  HENT:     Head: Normocephalic.  Cardiovascular:     Rate and Rhythm: Normal rate.     Pulses: Normal pulses.  Pulmonary:     Effort: Pulmonary effort is normal.  Skin:    General: Skin is warm and dry.  Neurological:     Mental Status: She is alert and oriented to person, place, and time.  Psychiatric:        Mood and Affect: Mood normal.        Behavior: Behavior normal.        Thought Content: Thought content normal.        Judgment: Judgment normal.     BP 117/75   Pulse 82   Ht 5' 5 (1.651 m)   Wt 160 lb (72.6 kg)   BMI 26.63 kg/m   Past Medical History:  Diagnosis Date   Adenocarcinoma, colon (HCC)    Anemia    iron deficiency   Anxiety    Atrial fibrillation (HCC)    Carpal tunnel syndrome    Cataract    REMOVED BILATERAL   Chronic diarrhea    Colon polyps    Tubular adenoma   Coronary artery disease    Decreased vision    Degenerative disk disease    Dementia (HCC)    Moderate    Depression    Diabetes mellitus, type 2 (HCC)    diet controlled   Dysrhythmia    h/o AFIB   Frequent falls    GERD (gastroesophageal reflux disease)    History of blood transfusion    After surgeries   Hyperlipidemia    Hyperparathyroidism    Hypertension    IBS (irritable bowel syndrome)    Kidney disorder    Stage 3b chronic kidney disease   Osteoarthritis    Osteoporosis    Pneumonia    PONV (postoperative nausea and vomiting)    S/P coronary artery stent placement 2007    Social History   Socioeconomic History   Marital status: Divorced    Spouse name: Not on file   Number of children: 3   Years of education: Not on file   Highest education level: Not on file  Occupational History   Occupation: retired    Associate Professor: RETIRED  Tobacco Use   Smoking status: Never   Smokeless tobacco: Never  Vaping Use   Vaping status: Never Used  Substance and Sexual Activity   Alcohol  use: No    Alcohol/week: 0.0 standard drinks of alcohol   Drug use: No   Sexual activity: Never  Other Topics Concern   Not on file  Social History Narrative   No regular exercise, limited due to knees   Diet- addicted to sweets, some fruit and veggies, water daily.   Right handed   Caffeine-1-2 occasionally   Lives in SNF   Social Drivers of Health   Financial Resource Strain: Low Risk  (08/18/2022)   Overall Financial Resource Strain (CARDIA)    Difficulty of Paying Living Expenses: Not hard at all  Food Insecurity: No Food Insecurity (08/18/2022)   Hunger Vital Sign    Worried About Running Out of Food in the Last Year: Never true    Ran Out of Food in the Last Year: Never true  Transportation Needs: No Transportation Needs (08/18/2022)   PRAPARE - Administrator, Civil Service (Medical): No    Lack of Transportation (Non-Medical): No  Physical Activity: Inactive (08/18/2022)   Exercise Vital Sign    Days of Exercise per Week: 0 days    Minutes of Exercise per Session:  0 min  Stress: No Stress Concern Present (08/18/2022)   [employer] of Occupational Health - Occupational Stress Questionnaire    Feeling of Stress : Only a little  Social Connections: Socially Isolated (08/18/2022)   Social Connection and Isolation Panel    Frequency of Communication with Friends and Family: Once a week    Frequency of Social Gatherings with Friends and Family: More than three times a week    Attends Religious Services: Never    Database administrator or Organizations: No    Attends Banker Meetings: Never    Marital Status: Divorced  Catering manager Violence: Not At Risk (08/18/2022)   Humiliation, Afraid, Rape, and Kick questionnaire    Fear of Current or Ex-Partner: No    Emotionally Abused: No    Physically Abused: No    Sexually Abused: No    Past Surgical History:  Procedure Laterality Date   CARDIAC CATHETERIZATION  08/2009   LAD stent patent. Insignificant CAD, otherwise EF 60-65%   CARPAL TUNNEL RELEASE     CATARACT EXTRACTION Bilateral 03/2015   CHOLECYSTECTOMY     COLON SURGERY     COLONOSCOPY     DILATION AND CURETTAGE OF UTERUS     HEMATOMA EVACUATION     after knee replacement   KNEE ARTHROSCOPY     bilateral   LUMBAR LAMINECTOMY N/A 11/26/2015   Procedure: LUMBAR DECOMPRESSIVE LAMINECTOMY L3-4 AND L4-5, LEFT L2-3 HEMILAMINECTOMY;  Surgeon: Lynwood FORBES Better, MD;  Location: [facility] OR;  Service: Orthopedics;  Laterality: N/A;   ORIF ELBOW FRACTURE Left 03/18/2021   Procedure: OPEN REDUCTION INTERNAL FIXATION (ORIF) ELBOW/OLECRANON FRACTURE;  Surgeon: Beverley Evalene BIRCH, MD;  Location: [facility]S;  Service: Orthopedics;  Laterality: Left;   ORIF ELBOW FRACTURE Left 07/28/2022   Procedure: OPEN REDUCTION INTERNAL FIXATION (ORIF) ELBOW/OLECRANON FRACTURE with screws AND PIN REMOVAL;  Surgeon: Beverley Evalene BIRCH, MD;  Location: [facility];  Service: Orthopedics;  Laterality: Left;  Block   PARTIAL COLECTOMY  11/2008   right, for adenocarcinoma    REPLACEMENT TOTAL KNEE  04/2010   right, complicated by hemarthrosis    REVERSE SHOULDER ARTHROPLASTY Right 05/28/2016   Procedure: REVERSE SHOULDER ARTHROPLASTY;  Surgeon: Glendia Cordella Hutchinson, MD;  Location: [facility] OR;  Service: Orthopedics;  Laterality: Right;   TONSILLECTOMY  TOTAL KNEE ARTHROPLASTY Left 04/03/2014   Procedure: TOTAL KNEE ARTHROPLASTY;  Surgeon: Maude LELON Right, MD;  Location: [facility];  Service: Orthopedics;  Laterality: Left;   TOTAL KNEE REVISION  03/08/2012   Procedure: TOTAL KNEE REVISION;  Surgeon: Maude LELON Right, MD;  Location: [facility] OR;  Service: Orthopedics;  Laterality: Right;  removal total knee hardware and placement of antibiotic cement spacer and antibiotic beads   TOTAL KNEE REVISION  05/17/2012   Procedure: TOTAL KNEE REVISION;  Surgeon: Maude LELON Right, MD;  Location: [facility] OR;  Service: Orthopedics;  Laterality: Right;  right total knee revision, removal of antibiotic spacer   UPPER GI ENDOSCOPY      Family History  Problem Relation Age of Onset   Heart disease Mother        massive MI age 18   Heart attack Mother    Heart disease Father        Massive MI age 42   Prostate cancer Father    Colon polyps Sister    Diabetes Paternal Grandmother    Colon cancer Neg Hx    Esophageal cancer Neg Hx    Pancreatic cancer Neg Hx    Stomach cancer Neg Hx    Liver disease Neg Hx    Breast cancer Neg Hx     Allergies  Allergen Reactions   Macrobid [Nitrofurantoin] Diarrhea, Nausea Only and Other (See Comments)    Jaundice Elevated liver enzymes   Augmentin [Amoxicillin -Pot Clavulanate] Swelling    Tongue swelling       Latest Ref Rng & Units 02/22/2024   12:18 PM 06/14/2023   11:54 AM 06/14/2023   11:45 AM  CBC  WBC 4.0 - 10.5 K/uL 12.8   13.3   Hemoglobin 12.0 - 15.0 g/dL 85.8  88.0  87.7   Hematocrit 36.0 - 46.0 % 42.8  35.0  37.3   Platelets 150 - 400 K/uL 183   163       CMP     Component Value Date/Time   NA 139 02/22/2024 1218   NA  141 01/11/2023 0937   K 3.7 02/22/2024 1218   CL 105 02/22/2024 1218   CO2 24 02/22/2024 1218   GLUCOSE 178 (H) 02/22/2024 1218   GLUCOSE 100 (H) 09/07/2006 0849   BUN 34 (H) 02/22/2024 1218   BUN 19 01/11/2023 0937   CREATININE 1.42 (H) 02/22/2024 1218   CREATININE 1.42 (H) 01/11/2015 0830   CALCIUM  8.6 (L) 02/22/2024 1218   PROT 6.1 (L) 02/22/2024 1218   ALBUMIN  3.4 (L) 02/22/2024 1218   AST 22 02/22/2024 1218   ALT 22 02/22/2024 1218   ALKPHOS 47 02/22/2024 1218   BILITOT 0.8 02/22/2024 1218   GFR 35.78 (L) 03/17/2022 0806   EGFR 36 (L) 01/11/2023 0937   GFRNONAA 37 (L) 02/22/2024 1218   GFRNONAA 36 (L) 01/11/2015 0830     No results found.     Assessment & Plan:   1. Abnormal ankle brachial index (Primary) The patient's noninvasive studies are normal with no evidence of peripheral arterial disease.  No recommendation for intervention at this time.  Patient will follow-up with us  on an as-needed basis.  2. Hyperlipidemia associated with type 2 diabetes mellitus (HCC) Continue statin as ordered and reviewed, no changes at this time   Current Outpatient Medications on File Prior to Visit  Medication Sig Dispense Refill   acetaminophen  (TYLENOL ) 325 MG tablet Take 2 tablets (650 mg total) by mouth every 6 (six) hours  as needed for mild pain or moderate pain. 60 tablet 0   atorvastatin  (LIPITOR) 40 MG tablet Take 40 mg by mouth daily.     baclofen  (LIORESAL ) 10 MG tablet Take 10 mg by mouth 2 (two) times daily as needed for muscle spasms.     carvedilol  (COREG ) 12.5 MG tablet TAKE 1 TABLET (12.5MG  TOTAL) BY MOUTH TWICE A DAY WITH MEALS 180 tablet 3   cetirizine (ZYRTEC) 10 MG tablet Take 10 mg by mouth at bedtime.     diltiazem  (CARDIZEM  CD) 180 MG 24 hr capsule TAKE 1 CAPSULE BY MOUTH EVERY DAY 90 capsule 3   ELIQUIS  2.5 MG TABS tablet TAKE 1 TABLET BY MOUTH TWICE A DAY 60 tablet 11   ferrous sulfate  325 (65 FE) MG EC tablet Take 325 mg by mouth 2 (two) times daily.      furosemide  (LASIX ) 20 MG tablet Take 20 mg by mouth daily.     ipratropium-albuterol (DUONEB) 0.5-2.5 (3) MG/3ML SOLN Take 3 mLs by nebulization every 6 (six) hours as needed (shortness of breath).     isosorbide  mononitrate (IMDUR ) 30 MG 24 hr tablet TAKE 1 TABLET BY MOUTH EVERY DAY 90 tablet 2   magnesium  oxide (MAG-OX) 400 (240 Mg) MG tablet Take 400 mg by mouth daily.     melatonin 3 MG TABS tablet Take 3 mg by mouth at bedtime.     Menthol , Topical Analgesic, (BIOFREEZE COOL THE PAIN) 4 % GEL Apply 1 Application topically 3 (three) times daily. To both shoulders     OLANZapine  (ZYPREXA ) 2.5 MG tablet Take 2.5 mg by mouth at bedtime.     omeprazole  (PRILOSEC) 20 MG capsule TAKE 1 CAPSULE BY MOUTH EVERY DAY 90 capsule 3   ondansetron  (ZOFRAN -ODT) 4 MG disintegrating tablet Take 1 tablet (4 mg total) by mouth every 8 (eight) hours as needed. 20 tablet 0   potassium chloride  (MICRO-K ) 10 MEQ CR capsule Take 10 mEq by mouth daily.     traMADol  (ULTRAM ) 50 MG tablet Take 50 mg by mouth daily as needed.     Vitamin D , Ergocalciferol , (DRISDOL ) 1.25 MG (50000 UNIT) CAPS capsule TAKE 1 CAPSULE (50,000 UNITS TOTAL) BY MOUTH EVERY 7 (SEVEN) DAYS 12 capsule 0   Calcium  Carb-Cholecalciferol (CALCIUM  + VITAMIN D3) 600-5 MG-MCG TABS Take 1 tablet by mouth daily.     cephALEXin  (KEFLEX ) 500 MG capsule Take 1 capsule (500 mg total) by mouth 3 (three) times daily. 21 capsule 0   cholestyramine  light (PREVALITE ) 4 g packet USE 1 PACKET TWICE DAILY WITH A MEAL, AT LEAST 2 HOURS AWAY FROM OTHER MEDS PATIENT NEEDS OFFICE VISIT (Patient taking differently: Take 4 g by mouth 2 (two) times daily. Give at least two hours away from other meds) 540 packet 3   citalopram  (CELEXA ) 40 MG tablet TAKE 1 TABLET BY MOUTH EVERY DAY (Patient taking differently: Take 40 mg by mouth daily.) 90 tablet 1   diclofenac  Sodium (VOLTAREN  ARTHRITIS PAIN) 1 % GEL Apply 2 g topically 2 (two) times daily. To neck     divalproex  (DEPAKOTE   SPRINKLE) 125 MG capsule Take 2 capsules (250 mg total) by mouth 3 (three) times daily. (Patient taking differently: Take 125 mg by mouth 3 (three) times daily.) 180 capsule 11   losartan  (COZAAR ) 100 MG tablet TAKE 1 TABLET BY MOUTH EVERY DAY (Patient not taking: Reported on 02/22/2024) 90 tablet 3   No current facility-administered medications on file prior to visit.    There are  no Patient Instructions on file for this visit. No follow-ups on file.   Sarinity Dicicco E Kimbree Casanas, NP

## 2024-07-03 LAB — VAS US ABI WITH/WO TBI
Left ABI: 1.25
Right ABI: 1.26

## 2024-09-28 DEATH — deceased

## 2025-03-06 ENCOUNTER — Ambulatory Visit: Admitting: Neurology
